# Patient Record
Sex: Male | Born: 1940
Health system: Southern US, Community
[De-identification: ages and names within clinical notes are randomized; demographics above are authoritative.]

## PROBLEM LIST (undated history)

## (undated) ENCOUNTER — Emergency Department (HOSPITAL_COMMUNITY): Admission: EM | Payer: PRIVATE HEALTH INSURANCE | Source: Home / Self Care

## (undated) DIAGNOSIS — I4891 Unspecified atrial fibrillation: Secondary | ICD-10-CM

## (undated) DIAGNOSIS — E785 Hyperlipidemia, unspecified: Secondary | ICD-10-CM

## (undated) DIAGNOSIS — Z8659 Personal history of other mental and behavioral disorders: Secondary | ICD-10-CM

## (undated) DIAGNOSIS — I10 Essential (primary) hypertension: Secondary | ICD-10-CM

## (undated) DIAGNOSIS — N529 Male erectile dysfunction, unspecified: Secondary | ICD-10-CM

## (undated) DIAGNOSIS — N400 Enlarged prostate without lower urinary tract symptoms: Secondary | ICD-10-CM

## (undated) DIAGNOSIS — I509 Heart failure, unspecified: Secondary | ICD-10-CM

## (undated) DIAGNOSIS — I219 Acute myocardial infarction, unspecified: Secondary | ICD-10-CM

## (undated) DIAGNOSIS — F32A Depression, unspecified: Secondary | ICD-10-CM

## (undated) DIAGNOSIS — C679 Malignant neoplasm of bladder, unspecified: Secondary | ICD-10-CM

## (undated) DIAGNOSIS — M1990 Unspecified osteoarthritis, unspecified site: Secondary | ICD-10-CM

## (undated) DIAGNOSIS — K589 Irritable bowel syndrome without diarrhea: Secondary | ICD-10-CM

## (undated) DIAGNOSIS — F329 Major depressive disorder, single episode, unspecified: Secondary | ICD-10-CM

## (undated) DIAGNOSIS — I429 Cardiomyopathy, unspecified: Secondary | ICD-10-CM

## (undated) DIAGNOSIS — G479 Sleep disorder, unspecified: Secondary | ICD-10-CM

## (undated) DIAGNOSIS — R350 Frequency of micturition: Secondary | ICD-10-CM

## (undated) DIAGNOSIS — I2510 Atherosclerotic heart disease of native coronary artery without angina pectoris: Secondary | ICD-10-CM

## (undated) HISTORY — DX: Personal history of other mental and behavioral disorders: Z86.59

## (undated) HISTORY — DX: Essential (primary) hypertension: I10

## (undated) HISTORY — DX: Unspecified atrial fibrillation: I48.91

## (undated) HISTORY — DX: Atherosclerotic heart disease of native coronary artery without angina pectoris: I25.10

## (undated) HISTORY — DX: Male erectile dysfunction, unspecified: N52.9

## (undated) HISTORY — DX: Cardiomyopathy, unspecified: I42.9

## (undated) HISTORY — PX: TONSILLECTOMY: SUR1361

## (undated) HISTORY — DX: Malignant neoplasm of bladder, unspecified: C67.9

## (undated) HISTORY — DX: Hyperlipidemia, unspecified: E78.5

---

## 1997-04-17 ENCOUNTER — Inpatient Hospital Stay (HOSPITAL_COMMUNITY): Admission: EM | Admit: 1997-04-17 | Discharge: 1997-04-30 | Payer: Self-pay | Admitting: *Deleted

## 1997-05-02 ENCOUNTER — Inpatient Hospital Stay (HOSPITAL_COMMUNITY): Admission: AD | Admit: 1997-05-02 | Discharge: 1997-05-10 | Payer: Self-pay | Admitting: *Deleted

## 2000-01-11 DIAGNOSIS — I219 Acute myocardial infarction, unspecified: Secondary | ICD-10-CM

## 2000-01-11 HISTORY — DX: Acute myocardial infarction, unspecified: I21.9

## 2000-08-10 HISTORY — PX: CORONARY ARTERY BYPASS GRAFT: SHX141

## 2000-08-17 ENCOUNTER — Encounter: Payer: Self-pay | Admitting: Emergency Medicine

## 2000-08-17 ENCOUNTER — Inpatient Hospital Stay (HOSPITAL_COMMUNITY): Admission: EM | Admit: 2000-08-17 | Discharge: 2000-08-25 | Payer: Self-pay | Admitting: Emergency Medicine

## 2000-08-19 ENCOUNTER — Encounter: Payer: Self-pay | Admitting: Cardiothoracic Surgery

## 2000-08-21 ENCOUNTER — Encounter: Payer: Self-pay | Admitting: Cardiothoracic Surgery

## 2000-08-22 ENCOUNTER — Encounter: Payer: Self-pay | Admitting: Cardiothoracic Surgery

## 2000-08-23 ENCOUNTER — Encounter: Payer: Self-pay | Admitting: Cardiothoracic Surgery

## 2000-09-07 ENCOUNTER — Encounter: Payer: Self-pay | Admitting: Cardiology

## 2000-09-07 ENCOUNTER — Ambulatory Visit (HOSPITAL_COMMUNITY): Admission: RE | Admit: 2000-09-07 | Discharge: 2000-09-07 | Payer: Self-pay | Admitting: Cardiology

## 2000-09-21 ENCOUNTER — Encounter (HOSPITAL_COMMUNITY): Admission: RE | Admit: 2000-09-21 | Discharge: 2000-10-21 | Payer: Self-pay | Admitting: Cardiology

## 2000-10-23 ENCOUNTER — Encounter (HOSPITAL_COMMUNITY): Admission: RE | Admit: 2000-10-23 | Discharge: 2000-11-22 | Payer: Self-pay | Admitting: Cardiology

## 2000-11-24 ENCOUNTER — Encounter (HOSPITAL_COMMUNITY): Admission: RE | Admit: 2000-11-24 | Discharge: 2000-12-24 | Payer: Self-pay | Admitting: Cardiology

## 2000-12-25 ENCOUNTER — Encounter (HOSPITAL_COMMUNITY): Admission: RE | Admit: 2000-12-25 | Discharge: 2001-01-24 | Payer: Self-pay | Admitting: Cardiology

## 2001-05-29 ENCOUNTER — Emergency Department (HOSPITAL_COMMUNITY): Admission: EM | Admit: 2001-05-29 | Discharge: 2001-05-29 | Payer: Self-pay | Admitting: *Deleted

## 2001-05-29 ENCOUNTER — Encounter: Payer: Self-pay | Admitting: *Deleted

## 2001-05-29 ENCOUNTER — Inpatient Hospital Stay (HOSPITAL_COMMUNITY): Admission: AD | Admit: 2001-05-29 | Discharge: 2001-05-31 | Payer: Self-pay | Admitting: Internal Medicine

## 2001-05-30 ENCOUNTER — Encounter: Payer: Self-pay | Admitting: Internal Medicine

## 2001-06-19 ENCOUNTER — Ambulatory Visit (HOSPITAL_COMMUNITY): Admission: RE | Admit: 2001-06-19 | Discharge: 2001-06-19 | Payer: Self-pay | Admitting: Cardiology

## 2002-02-13 ENCOUNTER — Encounter: Payer: Self-pay | Admitting: *Deleted

## 2002-02-13 ENCOUNTER — Emergency Department (HOSPITAL_COMMUNITY): Admission: EM | Admit: 2002-02-13 | Discharge: 2002-02-13 | Payer: Self-pay | Admitting: *Deleted

## 2002-02-26 ENCOUNTER — Ambulatory Visit (HOSPITAL_COMMUNITY): Admission: RE | Admit: 2002-02-26 | Discharge: 2002-02-26 | Payer: Self-pay | Admitting: Internal Medicine

## 2003-10-11 HISTORY — PX: CIRCUMCISION: SUR203

## 2003-10-15 ENCOUNTER — Ambulatory Visit (HOSPITAL_COMMUNITY): Admission: RE | Admit: 2003-10-15 | Discharge: 2003-10-15 | Payer: Self-pay | Admitting: Urology

## 2003-10-15 ENCOUNTER — Ambulatory Visit (HOSPITAL_BASED_OUTPATIENT_CLINIC_OR_DEPARTMENT_OTHER): Admission: RE | Admit: 2003-10-15 | Discharge: 2003-10-15 | Payer: Self-pay | Admitting: Urology

## 2003-10-15 ENCOUNTER — Encounter (INDEPENDENT_AMBULATORY_CARE_PROVIDER_SITE_OTHER): Payer: Self-pay | Admitting: *Deleted

## 2003-10-30 ENCOUNTER — Ambulatory Visit (HOSPITAL_COMMUNITY): Admission: RE | Admit: 2003-10-30 | Discharge: 2003-10-30 | Payer: Self-pay | Admitting: Cardiology

## 2004-01-29 ENCOUNTER — Ambulatory Visit: Payer: Self-pay | Admitting: Psychiatry

## 2004-03-09 ENCOUNTER — Ambulatory Visit: Payer: Self-pay | Admitting: *Deleted

## 2004-04-22 ENCOUNTER — Ambulatory Visit: Payer: Self-pay | Admitting: Psychiatry

## 2004-05-25 ENCOUNTER — Ambulatory Visit: Payer: Self-pay | Admitting: Internal Medicine

## 2004-06-22 ENCOUNTER — Ambulatory Visit: Payer: Self-pay | Admitting: Psychiatry

## 2004-06-28 ENCOUNTER — Ambulatory Visit: Payer: Self-pay | Admitting: *Deleted

## 2004-08-18 ENCOUNTER — Ambulatory Visit: Payer: Self-pay | Admitting: Internal Medicine

## 2004-08-19 ENCOUNTER — Ambulatory Visit: Payer: Self-pay | Admitting: Psychiatry

## 2004-09-21 ENCOUNTER — Ambulatory Visit: Payer: Self-pay | Admitting: Psychiatry

## 2004-11-09 ENCOUNTER — Ambulatory Visit: Payer: Self-pay | Admitting: Psychiatry

## 2004-11-11 ENCOUNTER — Ambulatory Visit: Payer: Self-pay | Admitting: Internal Medicine

## 2004-12-27 ENCOUNTER — Ambulatory Visit: Payer: Self-pay | Admitting: *Deleted

## 2005-01-05 ENCOUNTER — Ambulatory Visit: Payer: Self-pay | Admitting: Cardiology

## 2005-01-13 ENCOUNTER — Ambulatory Visit: Payer: Self-pay | Admitting: Cardiology

## 2005-01-13 ENCOUNTER — Ambulatory Visit (HOSPITAL_COMMUNITY): Admission: RE | Admit: 2005-01-13 | Discharge: 2005-01-13 | Payer: Self-pay | Admitting: Cardiology

## 2005-01-18 ENCOUNTER — Ambulatory Visit: Payer: Self-pay | Admitting: Psychiatry

## 2005-03-23 ENCOUNTER — Ambulatory Visit: Payer: Self-pay | Admitting: Cardiology

## 2005-04-14 ENCOUNTER — Ambulatory Visit (HOSPITAL_COMMUNITY): Payer: Self-pay | Admitting: Psychiatry

## 2005-05-03 ENCOUNTER — Ambulatory Visit: Payer: Self-pay | Admitting: Internal Medicine

## 2005-06-14 ENCOUNTER — Ambulatory Visit (HOSPITAL_COMMUNITY): Payer: Self-pay | Admitting: Psychiatry

## 2005-07-21 ENCOUNTER — Ambulatory Visit: Payer: Self-pay | Admitting: Cardiology

## 2005-08-17 ENCOUNTER — Ambulatory Visit: Payer: Self-pay | Admitting: Internal Medicine

## 2005-09-08 ENCOUNTER — Ambulatory Visit (HOSPITAL_COMMUNITY): Payer: Self-pay | Admitting: Psychiatry

## 2005-10-07 ENCOUNTER — Emergency Department (HOSPITAL_COMMUNITY): Admission: EM | Admit: 2005-10-07 | Discharge: 2005-10-08 | Payer: Self-pay | Admitting: Emergency Medicine

## 2005-10-26 ENCOUNTER — Ambulatory Visit: Payer: Self-pay | Admitting: Internal Medicine

## 2005-11-23 ENCOUNTER — Ambulatory Visit: Payer: Self-pay | Admitting: Cardiology

## 2006-02-22 ENCOUNTER — Ambulatory Visit: Payer: Self-pay | Admitting: Internal Medicine

## 2006-03-15 ENCOUNTER — Ambulatory Visit: Payer: Self-pay | Admitting: Cardiology

## 2006-06-15 ENCOUNTER — Ambulatory Visit: Payer: Self-pay | Admitting: Internal Medicine

## 2006-06-28 ENCOUNTER — Ambulatory Visit: Payer: Self-pay | Admitting: Cardiology

## 2006-07-19 ENCOUNTER — Ambulatory Visit: Payer: Self-pay | Admitting: Cardiology

## 2006-08-09 ENCOUNTER — Ambulatory Visit: Payer: Self-pay | Admitting: Cardiology

## 2006-08-30 ENCOUNTER — Ambulatory Visit: Payer: Self-pay | Admitting: Cardiovascular Disease

## 2006-10-11 ENCOUNTER — Ambulatory Visit: Payer: Self-pay | Admitting: Cardiology

## 2006-10-12 ENCOUNTER — Ambulatory Visit: Payer: Self-pay | Admitting: Internal Medicine

## 2006-11-15 ENCOUNTER — Ambulatory Visit: Payer: Self-pay | Admitting: Cardiology

## 2006-11-20 ENCOUNTER — Ambulatory Visit: Payer: Self-pay | Admitting: Cardiology

## 2006-11-23 ENCOUNTER — Ambulatory Visit: Payer: Self-pay | Admitting: Internal Medicine

## 2007-01-14 ENCOUNTER — Emergency Department (HOSPITAL_COMMUNITY): Admission: EM | Admit: 2007-01-14 | Discharge: 2007-01-14 | Payer: Self-pay | Admitting: Emergency Medicine

## 2007-03-07 ENCOUNTER — Ambulatory Visit: Payer: Self-pay | Admitting: Internal Medicine

## 2007-07-26 ENCOUNTER — Ambulatory Visit: Payer: Self-pay | Admitting: Internal Medicine

## 2007-11-22 ENCOUNTER — Ambulatory Visit: Payer: Self-pay | Admitting: Cardiology

## 2007-11-22 DIAGNOSIS — Z8659 Personal history of other mental and behavioral disorders: Secondary | ICD-10-CM

## 2007-11-22 DIAGNOSIS — J45909 Unspecified asthma, uncomplicated: Secondary | ICD-10-CM | POA: Insufficient documentation

## 2007-11-22 DIAGNOSIS — J309 Allergic rhinitis, unspecified: Secondary | ICD-10-CM | POA: Insufficient documentation

## 2007-11-23 ENCOUNTER — Ambulatory Visit: Payer: Self-pay | Admitting: Internal Medicine

## 2007-11-30 ENCOUNTER — Encounter (HOSPITAL_COMMUNITY): Admission: RE | Admit: 2007-11-30 | Discharge: 2007-12-30 | Payer: Self-pay | Admitting: Cardiology

## 2007-11-30 ENCOUNTER — Ambulatory Visit: Payer: Self-pay | Admitting: Cardiology

## 2007-12-12 ENCOUNTER — Ambulatory Visit: Payer: Self-pay | Admitting: Internal Medicine

## 2007-12-13 ENCOUNTER — Encounter: Payer: Self-pay | Admitting: Cardiology

## 2008-03-16 ENCOUNTER — Encounter (INDEPENDENT_AMBULATORY_CARE_PROVIDER_SITE_OTHER): Payer: Self-pay | Admitting: *Deleted

## 2008-03-16 LAB — CONVERTED CEMR LAB
Creatinine, Ser: 1.05 mg/dL
Glucose, Bld: 98 mg/dL
Sodium: 131 meq/L

## 2008-04-09 ENCOUNTER — Encounter (INDEPENDENT_AMBULATORY_CARE_PROVIDER_SITE_OTHER): Payer: Self-pay | Admitting: *Deleted

## 2008-04-09 LAB — CONVERTED CEMR LAB
Albumin: 4.3 g/dL
Alkaline Phosphatase: 103 units/L
BUN: 22 mg/dL
CO2: 27 meq/L
Calcium: 9.1 mg/dL
Chloride: 100 meq/L
Creatinine, Ser: 1.24 mg/dL
Glucose, Bld: 97 mg/dL
HDL: 40 mg/dL
Potassium: 4.7 meq/L
Sodium: 138 meq/L
Total Protein: 6.9 g/dL
Triglycerides: 105 mg/dL

## 2008-04-21 ENCOUNTER — Ambulatory Visit: Payer: Self-pay | Admitting: Internal Medicine

## 2008-07-08 ENCOUNTER — Encounter (INDEPENDENT_AMBULATORY_CARE_PROVIDER_SITE_OTHER): Payer: Self-pay | Admitting: *Deleted

## 2008-08-28 ENCOUNTER — Ambulatory Visit: Payer: Self-pay | Admitting: Internal Medicine

## 2008-11-21 ENCOUNTER — Ambulatory Visit: Payer: Self-pay | Admitting: Internal Medicine

## 2008-11-28 ENCOUNTER — Ambulatory Visit: Payer: Self-pay | Admitting: Cardiology

## 2008-11-28 ENCOUNTER — Encounter (INDEPENDENT_AMBULATORY_CARE_PROVIDER_SITE_OTHER): Payer: Self-pay | Admitting: *Deleted

## 2008-11-28 DIAGNOSIS — G4733 Obstructive sleep apnea (adult) (pediatric): Secondary | ICD-10-CM | POA: Insufficient documentation

## 2008-11-28 DIAGNOSIS — E782 Mixed hyperlipidemia: Secondary | ICD-10-CM

## 2008-11-28 DIAGNOSIS — N529 Male erectile dysfunction, unspecified: Secondary | ICD-10-CM

## 2008-11-28 DIAGNOSIS — G4730 Sleep apnea, unspecified: Secondary | ICD-10-CM | POA: Insufficient documentation

## 2009-04-20 ENCOUNTER — Encounter (INDEPENDENT_AMBULATORY_CARE_PROVIDER_SITE_OTHER): Payer: Self-pay | Admitting: *Deleted

## 2009-04-20 LAB — CONVERTED CEMR LAB
AST: 20 units/L
Albumin: 4.6 g/dL
Alkaline Phosphatase: 88 units/L
BUN: 15 mg/dL
Calcium: 9.1 mg/dL
HCT: 40.5 %
Triglycerides: 43 mg/dL

## 2009-05-08 ENCOUNTER — Encounter (INDEPENDENT_AMBULATORY_CARE_PROVIDER_SITE_OTHER): Payer: Self-pay | Admitting: *Deleted

## 2009-06-03 ENCOUNTER — Ambulatory Visit: Payer: Self-pay | Admitting: Internal Medicine

## 2009-06-24 ENCOUNTER — Emergency Department (HOSPITAL_COMMUNITY)
Admission: EM | Admit: 2009-06-24 | Discharge: 2009-06-24 | Payer: Self-pay | Source: Home / Self Care | Admitting: Emergency Medicine

## 2009-06-25 ENCOUNTER — Emergency Department (HOSPITAL_COMMUNITY)
Admission: EM | Admit: 2009-06-25 | Discharge: 2009-06-25 | Payer: Self-pay | Source: Home / Self Care | Admitting: Emergency Medicine

## 2009-07-18 ENCOUNTER — Emergency Department (HOSPITAL_COMMUNITY): Admission: EM | Admit: 2009-07-18 | Discharge: 2009-07-18 | Payer: Self-pay | Admitting: Emergency Medicine

## 2009-10-23 ENCOUNTER — Ambulatory Visit: Payer: Self-pay | Admitting: Internal Medicine

## 2009-11-20 ENCOUNTER — Ambulatory Visit: Payer: Self-pay | Admitting: Internal Medicine

## 2009-11-25 ENCOUNTER — Ambulatory Visit: Payer: Self-pay | Admitting: Cardiology

## 2009-11-25 ENCOUNTER — Encounter (INDEPENDENT_AMBULATORY_CARE_PROVIDER_SITE_OTHER): Payer: Self-pay | Admitting: *Deleted

## 2009-11-30 ENCOUNTER — Encounter: Payer: Self-pay | Admitting: Cardiology

## 2009-11-30 LAB — CONVERTED CEMR LAB
HDL: 47 mg/dL (ref >39)
LDL Cholesterol: 76 mg/dL (ref 0–99)
VLDL: 12 mg/dL (ref 0–40)

## 2010-02-09 NOTE — Miscellaneous (Signed)
Summary: labs per [facility] [place] cbcd,cmp,lipid,04/20/2009  Clinical Lists Changes  Observations: Added new observation of CALCIUM: 9.1 mg/dL (98/11/9145 8:29) Added new observation of ALBUMIN: 4.6 g/dL (56/21/3086 5:78) Added new observation of PROTEIN, TOT: 6.4 g/dL (46/96/2952 8:41) Added new observation of SGPT (ALT): 18 units/L (04/20/2009 8:15) Added new observation of SGOT (AST): 20 units/L (04/20/2009 8:15) Added new observation of ALK PHOS: 88 units/L (04/20/2009 8:15) Added new observation of CREATININE: 1.15 mg/dL (32/44/0102 7:25) Added new observation of BUN: 15 mg/dL (36/64/4034 7:42) Added new observation of BG RANDOM: 100 mg/dL (59/56/3875 6:43) Added new observation of CO2 PLSM/SER: 27 meq/L (04/20/2009 8:15) Added new observation of CL SERUM: 96 meq/L (04/20/2009 8:15) Added new observation of K SERUM: 4.9 meq/L (04/20/2009 8:15) Added new observation of NA: 132 meq/L (04/20/2009 8:15) Added new observation of LDL: 67 mg/dL (32/95/1884 1:66) Added new observation of HDL: 54 mg/dL (07/10/1599 0:93) Added new observation of TRIGLYC TOT: 43 mg/dL (23/55/7322 0:25) Added new observation of CHOLESTEROL: 130 mg/dL (42/70/6237 6:28) Added new observation of PLATELETK/UL: 177 K/uL (04/20/2009 8:15) Added new observation of MCV: 92.3 fL (04/20/2009 8:15) Added new observation of HCT: 40.5 % (04/20/2009 8:15) Added new observation of HGB: 13.3 g/dL (31/51/7616 0:73) Added new observation of WBC COUNT: 6.2 10*3/microliter (04/20/2009 8:15)

## 2010-02-09 NOTE — Letter (Signed)
Summary: [facility] Work [employer], agricultural at [employer]  [address] [place] [address]   Phone: [phone]  Fax: [phone]     November 25, 2009 MRN: [id number]   Grant Stevens [address] [place], [place]  [address]      YOUR LAB WORK IS DUE   November 30, 2009  Please go to [facility], located across the street from [facility] on the second floor.  Hours are Monday - Friday 7am until 7:30pm         Saturday 8am until 12noon    _X_  DO NOT EAT OR DRINK AFTER MIDNIGHT EVENING PRIOR TO LABWORK

## 2010-02-09 NOTE — Assessment & Plan Note (Signed)
Summary: 1 YR F/UPER CHECKOUT ON 11/28/08/TG  Medications Added PROAIR HFA 108 (90 BASE) MCG/ACT AERS (ALBUTEROL SULFATE) use as needed RISPERDAL 1 MG TABS (RISPERIDONE) take 1 tab two times a day LASIX 40 MG TABS (FUROSEMIDE) take 1/2 tablet by mouth daily TEMAZEPAM 15 MG CAPS (TEMAZEPAM) take 1 tab at bedtime CLONAZEPAM 1 MG TABS (CLONAZEPAM) take 1/2 tab two times a day      Allergies Added: NKDA  Visit Type:  Follow-up Referring Provider:  Dr. Clair Gulling; Dr. Carlota Raspberry Primary Provider:  Dr. Carylon Perches   History of Present Illness: [name] is a very nice gentleman who spends an enormous amount of time volunteering at [facility]. and who returns for continued assessment and rx of ischemic cardiomyopathy, which has gradually improved over the past decade.  Patient is now asymptomatic from a cardiac standpoint denying orthopnea, PND, lightheadedness, syncope, exertional dyspnea or chest discomfort.  Blood pressure control has been good.  Allergies are under reasonable control with treatment.  Sleep apnea is controlled with positive pressure nocturnal ventilation.  Patient has no current medical complaints and has not required urgent medical care or hospitalization for some time.      Current Medications (verified): 1)  Epipen 0.3 Mg/0.53ml (1:1000)  Devi (Epinephrine Hcl (Anaphylaxis)) .... Inject As Directed As Needed For Allergic Severe Reaction 2)  Allergy Vaccine  1:10 (W-E)  Go .... Take Once Weekly 3)  Proair Hfa 108 (90 Base) Mcg/act Aers (Albuterol Sulfate) .... Use As Needed 4)  Lisinopril 10 Mg Tabs (Lisinopril) .... Take 1 Tablet By Mouth Once A Day 5)  Risperdal 1 Mg Tabs (Risperidone) .... Take 1 Tab Two Times A Day 6)  Fish Oil 1000 Mg Caps (Omega-3 Fatty Acids) .... Take 2  Tablet By Mouth Bid 7)  Lipitor 80 Mg Tabs (Atorvastatin Calcium) .Marland Kitchen.. 1 At Bedtime 8)  Tegretol 200 Mg Tabs (Carbamazepine) .... 2 Two Times A Day 9)  Bayer Low Strength  81 Mg Tbec (Aspirin) .Marland Kitchen.. 1 Once Daily 10)  Lasix 40 Mg Tabs (Furosemide) .... Take 1/2 Tablet By Mouth Daily 11)  Temazepam 15 Mg Caps (Temazepam) .... Take 1 Tab At Bedtime 12)  Clonazepam 1 Mg Tabs (Clonazepam) .... Take 1/2 Tab Two Times A Day  Allergies (verified): No Known Drug Allergies  Comments:  Nurse/Medical Assistant: patient brought meds and we reviewed med list from last ov and the only change is risperidone went from 0.5mg  two times a day to 1 mg two times a day and patient has started clonazepam 1 mg 1/2 tab two times a day Dr.plovsky     Past History:  PMH, FH, and Social History reviewed and updated.  Review of Systems       See history of present illness.  Vital Signs:  Patient profile:   70 year old male Weight:      243 pounds O2 Sat:      98 % on Room air Pulse rate:   78 / minute BP sitting:   101 / 63  (right arm)  Vitals Entered By: Dreama Saa, CNA (November 25, 2009 12:54 PM)  O2 Flow:  Room air  Physical Exam  General:  Overweight; well developed; no acute distress Weight-243, 2 pounds decreased since earlier this month Neck-No JVD; no carotid bruits: Lungs-No tachypnea, no rales; no rhonchi; no wheezes; mild kyphosis Cardiovascular-normal PMI; normal S1 and S2; fourth heart sound present; grade 1-2 systolic murmur at the cardiac base Abdomen-BS normal; soft and non-tender  without masses or organomegaly:  Musculoskeletal-No deformities, no cyanosis or clubbing: Neurologic-Normal cranial nerves; symmetric strength and tone:  Skin-Warm, no significant lesions: Extremities-Distal pulses are preserved; no edema:     Impression & Recommendations:  Problem # 1:  ATHEROSCLEROTIC CARDIOVASCULAR DISEASE (ICD-429.2) No symptoms to suggest progression of disease or myocardial ischemia.  No requirement for intensification of therapy.  There has not been clinical evidence for congestive heart failure for quite some time.  His dose of furosemide  will be tapered, initially to 20 mg q.d.  He''ll carefully follow symptoms and weights at home and report any notable anomalies.  Problem # 2:  HYPERLIPIDEMIA (ICD-272.4) Lipid profile was excellent earlier this year; a repeat value will be obtained.  CHOL: 130 (04/20/2009)   LDL: 67 (04/20/2009)   HDL: 54 (04/20/2009)   TG: 43 (04/20/2009)  Problem # 3:  HYPERTENSION (ICD-401.1) Blood pressure control is good; current medications will be continued.  Problem # 4:  CORONARY ARTERY BYPASS GRAFT, HX OF (ICD-V45.81)  Other Orders: Future Orders: T-Lipid Profile (04540-98119) ... 11/30/2009  Patient Instructions: 1)  Your physician recommends that you schedule a follow-up appointment in: 9 months 2)  Your physician recommends that you return for lab work in: next week 3)  Your physician has recommended you make the following change in your medication: decrease furosemide to 1/2 tablet daily

## 2010-02-09 NOTE — Assessment & Plan Note (Signed)
Summary: rov 1 yr ///kp   Copy to:  Dr. Clair Gulling; Dr. Carlota Raspberry Primary Provider/Referring Provider:  Dr. Carylon Perches   History of Present Illness: From lov 11/26/06- HISTORY:  "Fine, a good year". He denies any asthma or any need for metered inhalers in a long time. 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 He had some mild nasal congestion problems early in the fall, but he says that allergy vaccine has kept him stable. He had flu shot.   11/23/07- allergic rhinitis, asthma. Continues comfortable and satisfied with Vaccine at 1:10. Rare need for rescue inhaler. had flu vax Discussed meds, vaccine, flu season.   November 21, 2008- Allergic rhinitis, asthma Had normal stress test. Woke with some pain and swelling around right eye- plans opthal checkup but better now. Still doing very well with allergy vaccine. We discussed his source for allergy syringes and replacement script written. Rarely asthma and never needs Epipen or Proair. No need for antihistamines.  November 20, 2009- Allergic rhinitis, asthma, CAD/MI/CABG One year f/u. Had flu vax. Denies wheeze, dyspnea, cough. used Proair no more than once.  Allergy shots still doing well. Gave him option to try stopping.     Asthma History    Initial Asthma Severity Rating:    Age range: 12+ years    Symptoms: 0-2 days/week    Nighttime Awakenings: 0-2/month    Interferes w/ normal activity: no limitations    SABA use (not for EIB): 0-2 days/week    Asthma Severity Assessment: Intermittent   Preventive Screening-Counseling & Management  Alcohol-Tobacco     Smoking Status: quit     Packs/Day: 1.0     Year Quit: 1973     Pack years: 15  Current Medications (verified): 1)  Epipen 0.3 Mg/0.27ml (1:1000)  Devi (Epinephrine Hcl (Anaphylaxis)) .... Inject As Directed As Needed For Allergic Severe Reaction 2)  Allergy Vaccine  1:10 (W-E)  Go .... Take Once Weekly 3)  Proair Hfa 108 (90 Base) Mcg/act Aers (Albuterol  Sulfate) .... 2 Puffs Four Times A Day As Needed 4)  Lisinopril 10 Mg Tabs (Lisinopril) .... Take 1 Tablet By Mouth Once A Day 5)  Risperdal 0.5 Mg Tabs (Risperidone) .... Take 1 Tab Two Times A Day 6)  Fish Oil 1000 Mg Caps (Omega-3 Fatty Acids) .... Take 2  Tablet By Mouth Bid 7)  Lipitor 80 Mg Tabs (Atorvastatin Calcium) .Marland Kitchen.. 1 At Bedtime 8)  Tegretol 200 Mg Tabs (Carbamazepine) .... 2 Two Times A Day 9)  Bayer Low Strength 81 Mg Tbec (Aspirin) .Marland Kitchen.. 1 Once Daily 10)  Lasix 40 Mg Tabs (Furosemide) .... Take 1 Tablet Daily  Allergies (verified): No Known Drug Allergies  Past History:  Past Medical History: Last updated: 11/28/2008 ASCVD: Coronary artery bypass graft surgery in 06/2001 following inferior myocardial infarction; ejection fraction      of 35% increase to 50% postoperatively; enrolled in WARCEF study HYPERTENSION Hyperlipidemia Tobacco abuse-discontinued Sleep apnea BIPOLAR AFFECTIVE DISORDER, HX OF (ICD-V11.8) ALLERGIC RHINITIS (ICD-477.9): Grass and weeds ASTHMA (ICD-493.90) ERECTILE DYSFUNCTION  Past Surgical History: Last updated: 11/28/2008 Coronary artery bypass graft surgery-06/2001 by Dr. Tyrone Sage and Tonsillectomy CIRCUMCISION 10/2003  Family History: Last updated: 01/14/2008 Welford Roche- Mother DM- Brother MI- Father   Mother-deceased age 36 from old age Father- deceased age 9 from MI  Social History: Last updated: 11/23/2007 Married No children No ETOH Former smoker.  Quit in 1973.  Smoked 1 ppd x 15 years exercise 4 x wkly Caffeine once a day  Risk Factors: Smoking Status: quit (11/20/2009) Packs/Day: 1.0 (11/20/2009)  Social History: Packs/Day:  1.0 Pack years:  15  Review of Systems      See HPI  The patient denies shortness of breath with activity, shortness of breath at rest, productive cough, non-productive cough, coughing up blood, chest pain, irregular heartbeats, acid heartburn, indigestion, loss of appetite, weight change,  abdominal pain, difficulty swallowing, sore throat, tooth/dental problems, headaches, nasal congestion/difficulty breathing through nose, and sneezing.    Vital Signs:  Patient profile:   70 year old male Height:      74 inches Weight:      245.38 pounds BMI:     31.62 O2 Sat:      96 % on Room air Pulse rate:   74 / minute BP sitting:   96 / 62  (left arm) Cuff size:   large  Vitals Entered By: Gweneth Dimitri RN (November 20, 2009 9:51 AM)  O2 Flow:  Room air Comments Medications reviewed with patient Daytime contact number verified with patient. Gweneth Dimitri RN  November 20, 2009 9:51 AM    Physical Exam  Additional Exam:  General: A/Ox3; pleasant and cooperative, NAD, SKIN: no rash, lesions NODES: no lymphadenopathy HEENT: [place]/AT, EOM- WNL, Conjuctivae- clear, PERRLA, TM-WNL, Nose- clear, Throat- clear and wnl, Mallampati III NECK: Supple w/ fair ROM, JVD- none, normal carotid impulses w/o bruits Thyroid- normal to palpation CHEST: Clear to P&A HEART: RRR, no m/g/r heard ABDOMEN: medium build. ZOX:WRUE, nl pulses, no edema  NEURO: Grossly intact to observation      Impression & Recommendations:  Problem # 1:  ALLERGIC RHINITIS (ICD-477.9)  I discussed allergy vaccine and alternatives. He is considering whether he would like to try stopping his shots.  Orders: Est. Patient Level III (45409)  Problem # 2:  ASTHMA (ICD-493.90) Excellent control of mild intermittent asthma.  Problem # 3:  TOBACCO ABUSE-DISCONTINUED (ICD-305.1)  We verified that he has successfully remained off tobacco.   Patient Instructions: 1)  Please schedule a follow-up appointment in 1 year. 2)  Call sooner if needed Prescriptions: PROAIR HFA 108 (90 BASE) MCG/ACT AERS (ALBUTEROL SULFATE) 2 puffs four times a day as needed  #1 x prn   Entered and Authorized by:   Waymon Budge MD   Signed by:   Waymon Budge MD on 11/20/2009   Method used:   Print then Give to Patient   RxID:    [id number] EPIPEN 0.3 MG/0.3ML (1:1000)  DEVI (EPINEPHRINE HCL (ANAPHYLAXIS)) Inject as directed as needed for allergic severe reaction  #1 x 12   Entered and Authorized by:   Waymon Budge MD   Signed by:   Waymon Budge MD on 11/20/2009   Method used:   Print then Give to Patient   RxID:   [id number]    Immunization History:  Influenza Immunization History:    Influenza:  historical (10/10/2009)

## 2010-05-19 ENCOUNTER — Encounter: Payer: Self-pay | Admitting: Cardiology

## 2010-05-25 NOTE — Letter (Signed)
November 20, 2006    Kingsley Callander. Ouida Sills, MD  [address]  [place] [address]   RE:  Grant Stevens, GOLEBIEWSKI  MRN:  [id number]  /  DOB:  10/04/1940   Dear Channing Mutters:   Mr. Fosberg returns to the office for continued assessment and treatment  of coronary disease and cardiovascular risk factors.  Since his last  visit, he has done superbly.  He reports no dyspnea nor chest  discomfort.  He has some mild pedal edema.  He continued to follow up  with his psychiatrist, who has adjusted his medications with generally  good control of symptoms.  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   CURRENT MEDICATIONS:  1. Vytorin 10/80 mg daily.  2. Risperdal 0.5 mg daily.  3. Fish Oil 1200 mg b.i.d.  4. Lisinopril 10 mg b.i.d.  5. Tegretol 200 mg b.i.d.  6. Aspirin 81 mg daily.  7. Furosemide 40 mg daily.   PHYSICAL EXAMINATION:  GENERAL:  Pleasant gentleman in no acute  distress.  VITAL SIGNS:  Weight is 230 pounds, 3 pounds more than in March of this  year.  Heart rate 80 and regular, blood pressure 94/60, respirations 16.  NECK:  No jugular venous distention; normal carotid upstrokes without  bruits.  LUNGS:  Clear.  HEART:  Distant first and second heart sounds.  ABDOMEN:  Soft and nontender; aortic pulsation not palpable; no  organomegaly.  EXTREMITIES:  1+ pretibial edema; distal pulses intact.   IMPRESSION:  The patient continues to do extremely well from a  cardiovascular standpoint.  Hypertension is certainly well controlled.  A recent lipid profile is acceptable with total cholesterol of 160, LDL  of 97, and HDL of 45.  A chemistry profile and CBC were also normal.  I  have recommended no changes in the patient's medical regime other than  to increase his dose of Fish Oil and we will plan to see this nice  gentleman again in one year.    Sincerely,      Gerrit Friends. Dietrich Pates, MD, [facility]  Electronically Signed    RMR/MedQ  DD:  11/20/2006  DT: 11/21/2006  Job #: [id number]

## 2010-05-25 NOTE — Assessment & Plan Note (Signed)
[facility]                             PULMONARY OFFICE NOTE   NAME:JOHNSONJsaon, Yoo                     MRN:          [id number]  DATE:11/23/2006                            DOB:          31-Oct-1940    PROBLEMS:  1. Asthma.  2. Allergic rhinitis.  3. Bipolar.  4. CHF/MI/CABG.   HISTORY:  Fine, a good year. He denies any asthma or any need for  metered inhalers in a long time. 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 He had some mild nasal congestion problems early in the fall, but  he says that allergy vaccine has kept him stable. He had flu shot.   MEDICATIONS:  1. Vytorin 10/80.  2. Lisinopril 10 mg.  3. Furosemide 40 mg.  4. Respirdal 0.5 mg.  5. Allergy vaccine.  6. Fish oil.   He is off of his heart study drug.   ALLERGIES:  No medication allergy.   He does not think that he needs to have a rescue inhaler refilled.   OBJECTIVE:  Weight 239 pounds, blood pressure 132/83, pulse 70, room air  saturation 96%. Pulse is regular. Heart sounds are normal. Lungs are  very clear. Breathing is unlabored. Nasal airway is clear.   IMPRESSION:  Rhinitis controlled. No recent asthma.   PLAN:  Continue vaccine. We have discussed environmental precautions and  warning symptoms. Schedule return 1 year, earlier p.r.n.     Clinton D. Maple Hudson, MD, [facility], FACP  Electronically Signed    CDY/MedQ  DD: 11/26/2006  DT: 11/27/2006  Job #: [id number]   cc:   Kingsley Callander. Ouida Sills, MD

## 2010-05-25 NOTE — Assessment & Plan Note (Signed)
[facility]                       [place] [facility]IOLOGY OFFICE NOTE   SHANNA, STRENGTH                     MRN:          [id number]  DATE:11/22/2007                            DOB:          1940-10-02    CARDIOLOGIST:  Gerrit Friends. Dietrich Pates, MD, [facility]   PRIMARY CARE PHYSICIAN:  Kingsley Callander. Ouida Sills, MD   REASON FOR VISIT:  One-year followup.   HISTORY OF PRESENT ILLNESS:  [name] is a 70 year old male with a  history of coronary artery disease status post inferior myocardial  infarction in 2002 followed by subsequent bypass surgery by Dr.  Tyrone Sage.  His grafts included a LIMA to LAD, vein graft to posterior  descending and vein graft to the diagonal.  His EF was previously  documented at 35%.  He was previously in the [facility] trial.  His most  recent echocardiogram in July 2007 demonstrated an EF of 50%.  He  returns for followup today.  He has overall been doing well without any  chest pain or shortness of breath.  He is quite active and volunteers at  [facility] [employer] twice a week.  He denies any exertional chest  heaviness or tightness or shortness of breath.  He denies any syncope,  near syncope, orthopnea, PND, or pedal edema.  He describes NYHA class  II symptoms.   CURRENT MEDICATIONS:  Risperdal 0.25 mg daily, 1 mg 2 tablets nightly,  Fish oil, Lisinopril 10 mg b.i.d., Tegretol 200 mg 2 tablets b.i.d.,  Aspirin 81 mg daily, Furosemide 40 mg daily, Lipitor 80 mg daily,  Lorazepam p.r.n., Digestive Advantage p.r.n.   ALLERGIES:  No known drug allergies.   PHYSICAL EXAMINATION:  GENERAL:  He is well-nourished, well-developed  male, in on distress.  VITAL SIGNS:  Blood pressure is 110/60, pulse 62, weight 222 pounds.  HEENT:  Normal neck without JVD.  CARDIAC:  Normal S1 and S2.  Regular rate and rhythm without murmur.  LUNGS:  Clear to auscultation bilaterally.  ABDOMEN:  Soft, nontender.  EXTREMITIES:  Without edema.  NEUROLOGIC:  He  is alert and oriented x3.  Cranial II-XII are grossly  intact.  VASCULAR:  No carotid bruits noted bilaterally.  Femoral artery pulses  were 2+ bilaterally without bruits.   ASSESSMENT AND PLAN:  1. Coronary artery disease, status post prior inferior myocardial      infarction and subsequent bypass surgery with grafts as outlined      above in 2002.  His last Cardiolite study was in 2003 and this      demonstrated no ischemia.  His prior EF was 35%, but his most      recent EF was documented at 50% by echocardiogram in 2007.  He has      not had an ischemic evaluation in 6 years now.  He will be set up      for routine stress Cardiolite testing to assess for graft patency.      We will obtain an EKG before he leaves the office today as well.  2. Dyslipidemia.  His goal LDL is less than or equal to 70.  We will      try to obtain recent labs by Dr. Ouida Sills for our own records.  3. Hypertension, well-controlled.  4. Bipolar disorder.  He will continue his current medications and      follow up with his psychiatrist.   DISPOSITION:  The patient will be brought back in followup in 1 year  with myself or Dr. Dietrich Pates or sooner should his stress test be  abnormal.      Tereso Newcomer, PA-C  Electronically Signed      Gerrit Friends. Dietrich Pates, MD, [facility]  Electronically Signed   SW/MedQ  DD: 11/22/2007  DT: 11/23/2007  Job #: [id number]

## 2010-05-28 NOTE — Op Note (Signed)
[place]. [facility]  Patient:    Grant Stevens, Grant Stevens                       MRN: [id number] Proc. Date: 08/21/00 Adm. Date:  [id number] Attending:  Waldo Laine CC:         Arturo Morton. Riley Kill, M.D. [facility]  Dr. Carylon Perches, [place]   Operative Report  PREOPERATIVE DIAGNOSIS:  Coronary occlusive disease with recent acute transmural myocardial infarction.  POSTOPERATIVE DIAGNOSIS:  Coronary occlusive disease with recent acute transmural myocardial infarction.  PROCEDURE:  Coronary artery bypass grafting x 3, with the left internal mammary artery to the left anterior descending coronary artery, reversed saphenous vein graft to the diagonal coronary artery, reversed saphenous vein graft to posterior descending coronary artery.  SURGEON:  Gwenith Daily. Tyrone Sage, M.D.  FIRST ASSISTANT:  Lissa Merlin, P.A.  BRIEF HISTORY:  Patient is a 70 year old male who several days prior to admission had the onset of chest discomfort, increasing shortness of breath, pallor.  Because of ongoing symptoms over a several-day period, he sought medical attention.  EKG revealed acute myocardial infarction.  The patient was referred to [facility].  He underwent cardiac catheterization by Dr. Riley Kill, which demonstrated a totally occluded right coronary artery after the takeoff of a moderate-sized acute marginal.  This vessel appeared to be acutely occluded.  In addition, the patient had severe bifurcation disease in the LAD at the takeoff of a diagonal of greater than 80%.  The circumflex coronary had luminal irregularities but no high-grade stenosis.  The patient had decreased LV function with evidence of inferior posterior myocardial infarction.  Coronary artery bypass grafting was recommended to the patient, who agreed and signed informed consent.  DESCRIPTION OF PROCEDURE:  With Swan-Ganz and arterial line monitors in place, the patient underwent general endotracheal  anesthesia without incident.  Skin of the chest and legs was prepped with Betadine and draped in the usual sterile manner.  A small incision was made in the left ankle over the vein, and the left ankle appeared small.  An incision was made in the right ankle and the vein appeared, to be larger and a segment of vein was harvested from the right lower leg and was of good quality and caliber.  A median sternotomy was performed.  The left internal mammary artery was dissected down as a pedicle graft.  The distal artery was divided, had good, free flow.  The pericardium was opened.  Overall the anterior myocardial appeared to have good function.  There was evidence of right ventricular enlargement and significant inferior posterior hypokinesis and evidence of recent transmural infarct.  The patient was systemically heparinized, the ascending aorta and the right atrium were cannulated, and the aortic root vent cardioplegia needle was introduced into the ascending aorta.  The patient was placed on cardiopulmonary bypass, 2.4 L/min. per sq m.  Sites of anastomosis were selected and dissected out of the epicardium.  The patients body temperature was cooled to 30 degrees, aortic crossclamp was applied, and 500 cc of cold blood potassium cardioplegia was administered with rapid diastolic arrest of the heart.  The myocardial septal temperature was monitored throughout the crossclamp period.  Attention was turned first to the posterior descending coronary artery, which was opened and was a diffusely diseased vessel but did admit a 1.5 mm probe both proximally and distally.  Using a running 7-0 Prolene, a segment of vein was anastomosed to the posterior descending coronary  artery.  Additional cold blood cardioplegia was administered down the vein graft.  Attention was then turned to the diagonal coronary artery, which was fairly high on the lateral wall and not in a suitable position to sequential the  left internal mammary to the diagonal and the LAD.  The vessel was opened and admitted a 1.5 mm probe. Using a running 7-0 Prolene, a segment of reversed saphenous vein graft was anastomosed to the diagonal coronary artery.  Attention was then turned to the left anterior descending coronary artery, which was opened and admitted a 1.5 mm probe.  Using running 8-0 Prolene, the left internal mammary artery was anastomosed to the left anterior descending coronary artery.  With release of the Edwards bulldog on the mammary artery, there was appropriate rise in myocardial septal temperature.  The aortic crossclamp was removed.  Total crossclamp time 39 minutes.  The patient spontaneously converted to a sinus rhythm.  A partial occlusion clamp was placed on the ascending aorta.  Two punch aortotomies were performed, each of the two vein grafts anastomosed to the ascending aorta.  The air was evacuated from the grafts, and the partial occlusion clamp was removed.  Sites of anastomosis were inspected and were free of bleeding.  Patient was then ventilated, weaned from cardiopulmonary bypass without difficulty on low-dose milrinone and dopamine.  He remained hemodynamically stable.  He was decannulated in the usual fashion.  Protamine sulfate was administered.  With the operative field hemostatic, two atrial and two ventricular pacing wires were applied, graft markers applied.  Left pleural tube, two mediastinal tubes left in place.  The sternum was closed with #6 stainless steel wire.  Fascia closed with interrupted 0 Vicryl, running 3-0 Vicryl in the subcutaneous tissue, and a 4-0 subcuticular stitch in the skin edges.  Dry dressings were applied.  Sponge and needle count was reported as correct at completion of the procedure.  The patient tolerated the procedure without obvious complication and was transferred to the surgical intensive care unit for further postoperative care. DD:  08/22/00 TD:   08/22/00 Job: [id number]

## 2010-05-28 NOTE — H&P (Signed)
[facility]  Patient:    Grant Stevens, Grant Stevens Visit Number: [id number] MRN: [id number]          Service Type: MED Location: 2A A206 01 Attending Physician:  Carylon Perches Dictated by:   Carylon Perches, M.D. Admit Date:  05/29/2001                           History and Physical  CHIEF COMPLAINT:  Shortness of breath.  HISTORY OF THE PRESENT ILLNESS:  This patient is a 70 year old white male with a history of bypass surgery in August 2002, who presented to cardiology clinic for evaluation of congestive heart failure and was felt to require admission. He had awakened from sleep with acute onset of shortness of breath the night before and had been treated in the emergency room with IV Lasix after a diagnosis of congestive heart failure had been made.  The patient had a brisk diuresis.  He helped an acquaintance capture some bees [place] at [facility] and then reported to the cardiology clinic.  He denied any symptoms of chest pain.  He had had some mild dyspnea on exertion.  There was no diaphoresis, vomiting or syncope.  He had experienced an out-of-the-hospital inferior MI prior to his bypass surgery.  His ejection fraction pre-bypass was 55%.  He had an inferior wall motion abnormality.  He had not previously experienced any paroxysmal nocturnal dyspnea or orthopnea.  PAST MEDICAL HISTORY: 1. Coronary artery disease, status post CABG. 2. Bipolar disorder. 3. Hypertension. 4. Hyperlipidemia. 5. Tonsillectomy.  MEDICATIONS: 1. Pravachol 80 mg q.d. 2. Foltx q.d. 3. Altace 5 mg q.d. 4. Aspirin q.d. 5. Tegretol 200 mg q.a.m., 400 mg q.h.s. 6. Neurontin 100 mg q.i.d.  ALLERGIES:  None.  SOCIAL HISTORY:  He works in Airline pilot in [employer].  He does not smoke cigarettes, drink alcohol or use recreational drugs.   REVIEW OF SYSTEMS:  No chest pain, syncope, change in bowel habits or difficulty voiding.  PHYSICAL  EXAMINATION:  VITAL SIGNS:  Temperature 97.6, pulse 88, respirations 20, blood pressure 145/82.  GENERAL:  Alert, oriented white male in no acute distress.  HEENT:  No scleral icterus.  Pharynx is unremarkable.  NECK:  Supple with no JVD, thyromegaly or carotid bruits.  LUNGS:  Clear.  HEART:  Regular with no murmurs or gallops.  ABDOMEN:  Nontender with no hepatosplenomegaly.  EXTREMITIES:  No cyanosis, clubbing or edema.  NEUROLOGIC:  Grossly intact.  LABORATORY AND ACCESSORY DATA:  White count 6.1, hemoglobin 14.8, platelets 196,000.  Sodium 136, potassium 4.3, glucose 115, BUN 17, creatinine 1.3. First two CPKs are 143 and 137 with troponins of 0.02 and 0.03.  His chest x-ray is consistent with CHF.  His EKG reveals normal sinus rhythm and a right bundle branch block.  IMPRESSION: 1. New-onset congestive heart failure.  He is being hospitalized for further    evaluation with cardiology consultation and an echocardiogram.  We will    start Lasix and continue Altace; his Altace dose has been increased to    10 mg a day.  Continue Pravachol and aspirin. 2. Coronary artery disease. 3. Hyperlipidemia. 4. Hypertension. 5. Bipolar disorder. Dictated by:   Carylon Perches, M.D. Attending Physician:  Carylon Perches DD:  05/30/01 TD:  05/31/01 Job: [id number]

## 2010-05-28 NOTE — Procedures (Signed)
NAME:  Grant, Stevens              ACCOUNT NO.:  [id number]   MEDICAL RECORD NO.:  [id number]          PATIENT TYPE:  OUT   LOCATION:  RAD                           FACILITY:  [facility]   PHYSICIAN:  [place] Bing, M.D. [facility] OF BIRTH:  [birth year]   DATE OF PROCEDURE:  01/13/2005  DATE OF DISCHARGE:                                  ECHOCARDIOGRAM   REFERRING:  Dr. Ouida Sills and Dr. Dietrich Pates.   CLINICAL DATA:  A 70 year old gentleman with cardiomegaly.   M-MODE:  Aorta 3.6, left atrium 5.1, septum 1.4, posterior wall 1.4, LV  diastole 5.2, LV systole 4.2.   1.  Technically suboptimal but adequate echocardiographic study.  2.  Mild right and left atrial enlargement; normal right ventricle.  3.  Trileaflet aortic valve; mild sclerosis of the leaflets; mild annular      calcification.  4.  Normal tricuspid valve.  5.  Normal mitral valve; mild annular calcification; trace regurgitation.  6.  Left ventricular size at the upper limit of normal; mild concentric      hypertrophy; small dyskinetic segment at the base of the      inferior/posterior regions; the remainder of the inferior wall is      somewhat hypokinetic. Overall LV systolic function is low normal.  7.  Normal IVC.  8.  Comparison with prior study of June 19, 2001:  Left ventricular function      has improved.      [place] Bing, M.D. [facility]  Electronically Signed     RR/MEDQ  D:  01/13/2005  T:  01/14/2005  Job:  [id number]

## 2010-05-28 NOTE — Assessment & Plan Note (Signed)
 HEALTHCARE                               PULMONARY OFFICE NOTE   NAME:JOHNSONCotton, Beckley                     MRN:          [id number]  DATE:08/17/2005                            DOB:          1940/04/10    PROBLEM:  1. Asthma.  2. Allergic rhinitis.  3. Bipolar disorder.  4. Coronary disease/infarction/heart failure/bypass.   HISTORY:  He comes for a one-year followup of his allergy problems reporting  he has been stable over the past year with no major flare-ups or symptomatic  problems.  He continues his allergy vaccine at 1:10 wishing to continue  giving his own injections.  We again reviewed risk and benefit  considerations and I discussed policy and concerns related to administration  outside of a medical office, anaphylaxis and EpiPen.  He has never had any  significant reaction to his vaccine and believes it helps him.  He is not  smoking.   MEDICATIONS:  1. Neurontin.  2. Vytorin 10/80.  3. Tegretol.  4. Lisinopril 10 mg.  5. Furosemide 40 mg.  6. Risperdal 0.5 mg.  7. Heart study drug.   ALLERGIES:  No medication allergy.  EpiPen available.   OBJECTIVE:  Weight 224 pounds, BP 108/58, pulse regular 71, room air  saturation 96%.  Calm, quiet affect.  Conjunctivae, nasal mucosa and pharynx  were clear.  LUNGS:  Clear to P&A with no wheeze or rales.  HEART:  Heart sounds regular without murmur or gallop heard.  No edema.   IMPRESSION:  Stable control of asthma and allergic rhinitis.  Asthma in  particular has been insignificant, not needing any bronchodilator although  we will continue to watch that.   PLAN:  1. With risk/benefit discussion as above, he has signed our allergy      vaccine waiver form.  2. Epinephrine/EpiPen refill.  3. Schedule return one year, earlier prn.                                   Clinton D. Maple Hudson, MD, FCCP, FACP   CDY/MedQ  DD:  08/21/2005  DT:  08/22/2005  Job #:  [id number]   cc:   Kingsley Callander.  Ouida Sills, MD  Gerrit Friends. Dietrich Pates, MD, [facility]

## 2010-05-28 NOTE — Cardiovascular Report (Signed)
[place]  Patient:    Grant Stevens, Grant Stevens                       MRN: [id number] Proc. Date: 08/17/00 Adm. Date:  [id number] Attending:  Veneda Melter CC:         Carylon Perches, M.D.  Veneda Melter, M.D.  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  He did not seek medical attention, but was convinced after 48 hours, by his sister, to see his private physician who promptly and appropriately referred him to the [facility] Emergency Room by ambulance.  There was evidence of an inferior infarction by EKG.  On arrival in the emergency room, CPK-MB was in the range of 1800 mcg/dl and there were Q-waves in the electrocardiogram.  His main complaint at this point was shortness of breath.  The current study was done to assess coronary anatomy.  PROCEDURES PERFORMED: 1. Left heart catheterization. 2. Selective coronary arteriography. 3. Selective left ventriculography. 4. Subclavian angiography.  DESCRIPTION OF PROCEDURE:  The patient was brought to the catheterization laboratory and prepped and draped in the usual fashion.  Through an anterior puncture, the right femoral artery was easily entered.  A 7-French sheath was placed.  Views of the right and left coronary arteries were obtained in multiple angiographic projections.  Ventriculography was performed in the RAO projection.  When it became obvious that the patient would require revascularization surgery, we then performed a subclavian angiogram.  At this point, CVTS was called and Dr. Dorris Fetch responded.  We discussed the case and surgical revascularization, probably on an elective basis if the patient remains stable, was recommended.  I also reviewed the films with Dr. Veneda Melter who was in agreement with this proposal.  The patient was taken to the holding  area is satisfactory clinical condition after sewing the sheath in place.  HEMODYNAMIC DATA: 1. Central aortic pressure 100/66. 2. Left ventricular pressure 105/24. 3. No aortic to left ventricular gradient on pullback.  ANGIOGRAPHIC DATA: 1. Left ventriculography was performed in the RAO projection.  Ejection    fraction was calculated using the area-length method.  Ejection fraction    was calculated at 55%.  There was an extensive area of severe inferior wall    hypokinesis.  The area did not appear to be completely akinetic. 2. The left main coronary artery demonstrates some luminal irregularity    throughout with no lesion specifically exceeding 30% in luminal reduction. 3. The left anterior descending artery is calcified.  There is about a 60%    area of focal narrowing in the proximal vessel followed by a subtotal 95%    stenosis in the mid vessel subtending a large diagonal branch.  Both the    diagonal and distal LAD appeared to be suitable for grafting.  There were    septal collaterals to the distal PDA system. 4. The circumflex provided a large marginal branch and a smaller marginal    branch.  Proximally, there was about 30% narrowing that did not appear to    be high-grade. 5. The right coronary artery demonstrates 70% narrowing, tapering down to 99%    and then the vessel is totally occluded.  This overlaps an origin of a    right ventricular branch, which itself has a 70% narrowing.  The distal  right coronary artery fills by collaterals from the left septal    perforators. 6. The subclavian vessel appears to be widely patent, as does the internal    mammary.  It appears to be a suitable graft for revascularization.  CONCLUSIONS: 1. Mild reduction in global left ventricular function with an extensive    inferior wall motion abnormality with hypo-, but not akinesis. 2. Q-waves and elevated CPKs, suggesting myocardial infarction of    approximately 24-48 hours  old. 3. Extensive disease of the left anterior descending artery overlapping the    origin of a diagonal branch.  DISPOSITION:  Based upon the patients anatomy, the LAD is most worrisome for a bifurcational stenosis that is not ideal for percutaneous intervention. With the total occlusion of the right coronary artery, extensive collaterals, and viable myocardium, revascularization surgery would be strongly considered for relief of symptoms and myocardial salvage.  A surgical consultation has been obtained with Dr. Dorris Fetch and we have discussed the case.  In the interim, he will be placed in the coronary care step-down area with close monitoring. DD:  08/17/00 TD:  08/18/00 Job: [id number]

## 2010-05-28 NOTE — Letter (Signed)
November 23, 2005    Kingsley Callander. Ouida Sills, MD  [address]  [place], [place] [address]   RE:  Grant Stevens, Grant Stevens  MRN:  [id number]  /  DOB:  1940/09/21   Dear Grant Stevens:   Grant Stevens returns to the office for continued assessment and treatment of  coronary disease. Since his last visit one year ago, he has done  beautifully. His only significant medical problem has been constipation that  required treatment in the emergency department. He reports no dyspnea nor  chest discomfort. He is followed by a new psychiatrist, Grant Stevens, who is  excellent. He had an echocardiogram in January that revealed recovery of  left ventricular systolic function. His ejection fraction was previously  0.35 and is now near normal. He continues to participate in the [facility]  protocol, receiving either aspirin or Warfarin. Recent laboratory performed  in your office was excellent with good control of hyperlipidemia, normal  electrolytes, normal LFTs and normal renal function.   CURRENT MEDICATIONS:  1. Furosemide 40 mg daily.  2. Aspirin 81 mg daily.  3. Lisinopril 10 mg daily.  4. WARCEF study drug.  5. Vytorin 10/80 mg daily.  6. Fish oil 1000 mg b.i.d.  7. Risperdal 1 mg q.h.s.  8. Carbamazepine 400 mg b.i.d.  9. Ambien 12.5 mg q.h.s. Despite taking Ambien, he continues to have a      sleep disturbance with early morning awakening.   PHYSICAL EXAMINATION:  GENERAL:  Pleasant, overweight gentleman in no acute  distress.  VITAL SIGNS:  The weight is 242, 20 pounds more than in July. Blood pressure  was 115/70, heart rate 75 and regular, respirations 16.  NECK:  No jugular venous distention; normal carotid upstrokes with a  questionable left-sided early systolic bruit.  HEENT:  Anicteric sclera.  CARDIAC:  Distant first and second heart sounds; normal PMI.  LUNGS:  Clear.  ABDOMEN:  Soft and nontender; no organomegaly.  EXTREMITIES:  1-2+ edema on the right; 1+ on the left. Venectomy scar on the   right.   IMPRESSION:  Grant Stevens is doing quite well. Although treatment with a beta  blocker would be desirable in light of his history of left ventricular  dysfunction and myocardial infarction, he did not tolerate even low doses of  carvedilol in the past due to relative hypotension. Otherwise, medications  are ideal. Vaccinations are up to date. I will plan to see this nice  gentleman again in one year.    Sincerely,      Gerrit Friends. Dietrich Pates, MD, [facility]  Electronically Signed    RMR/MedQ  DD: 11/23/2005  DT: 11/23/2005  Job #: [id number]

## 2010-05-28 NOTE — Op Note (Signed)
NAME:  Grant Stevens, Grant Stevens                        ACCOUNT NO.:  [id number]   MEDICAL RECORD NO.:  [id number]                   PATIENT TYPE:  AMB   LOCATION:  DAY                                  FACILITY:  [facility]   PHYSICIAN:  Lionel December, M.D.                 DATE OF BIRTH:  07/04/1940   DATE OF PROCEDURE:  DATE OF DISCHARGE:                                 OPERATIVE REPORT   DESCRIPTION OF PROCEDURE:  Total colonoscopy.   INDICATIONS FOR PROCEDURE:  The patient is a 70 year old Caucasian male who  is undergoing screening colonoscopy.  __________  colorectal carcinoma.  The  procedure was reviewed with the patient and informed consent was obtained.   PREOP MEDICATIONS:  Versed 2 mg IV.   INSTRUMENT USED:  Olympus video system.   DESCRIPTION OF PROCEDURE:  Procedure performed in the endoscopy suite.  The  patient's vital signs and oxygen saturations were monitored during the  procedure.  His systolic blood pressure was low around 90 and therefore no  Demerol was given.  The patient was placed in left lateral decubitus  position.  Rectal examination performed.  No abnormality noted on external  or digital exam.  Scope was placed in the rectum and advanced to the region  of the sigmoid colon and beyond.  Preparation was excellent.  A redundant  colon with fine pigmentation consistent with melanosis coli.  Could not pass  the scope across the hepatic flexure.  The scope was therefore removed and  exchanged for a pediatric scope.  Using different positions and abdominal  pressure, I was finally able to get to the cecum which was identified by the  appendiceal orifice and ileocecal valve.  Pictures taken for the record.  A  small flat polyp at cecum above the appendiceal orifice.  This was ablated  completely by cold biopsy.  Pictures were taken for the record.  As the  scope was withdrawn, the colonic mucosa was once again carefully examined  and no other abnormalities were noted.   The rectal mucosa was normal.  The  scope was retroflexed to examine the anorectum.  Small hemorrhoids were  noted below the dentate line.  The endoscope was straightened and withdrawn.  The patient tolerated the procedure well.   FINAL DIAGNOSES:  1. Examination performed to the cecum.  2. Redundant colon with mild changes of melanosis coli.  Small cecal polyp     ablated by cold biopsy.  3. Small external hemorrhoids.    RECOMMENDATIONS:  High fiber diet.  Citrucel one tablespoon full daily.  He  can continue stool softener as before.  He will resume his ASA other meds as  before.  I will contact the patient with biopsy results and further  recommendations.  Lionel December, M.D.    NR/MEDQ  D:  02/26/2002  T:  02/26/2002  Job:  [id number]   cc:   Kingsley Callander. Ouida Sills, M.D.  [address]  [place]  [place] [address]  Fax: [phone]   Jim Desanctis. Lucina Mellow, M.D.  [phone] [address]  [place]  [place]  [address]  Fax: [phone]

## 2010-05-28 NOTE — Op Note (Signed)
NAME:  Grant Stevens, Grant Stevens              ACCOUNT Grant Stevens.:  [id number]   MEDICAL RECORD Grant Stevens.:  [id number]          PATIENT TYPE:  AMB   LOCATION:  [facility]                         FACILITY:  [facility]   PHYSICIAN:  Ronald L. Ovidio Hanger, M.D.DATE OF BIRTH:  [birth year]   DATE OF PROCEDURE:  10/15/2003  DATE OF DISCHARGE:                                 OPERATIVE REPORT   DIAGNOSIS:  Balanitis.   OPERATIVE PROCEDURE:  Circumcision.   SURGEON:  Gaynelle Arabian, M.D.   ANESTHESIA:  LMA.   ESTIMATED BLOOD LOSS:  10 mL.   TUBES:  None.   COMPLICATIONS:  None.   INDICATIONS FOR PROCEDURE:  [name] is a very nice 70 year old white  male, who has had impotence, uses a vacuum erection device, has had  significant problem with foreskin swelling, edema, and irritation.  He has  tried various agents without much relief and after understanding risks,  benefits, and alternatives, elected to proceed with circumcision.   PROCEDURE IN DETAIL:  The patient was placed in a supine position.  After  proper LMA anesthesia, was draped with Betadine in a sterile fashion.  A  circumferential incision was made in the shaft skin at the appropriate level  and extended to Buck's fascia and the corporal spongiosum.  And a similar  incision was made approximately 2 mm proximal to the corona areata and  extended to the same level.  A dorsal slit was then performed, and the  foreskin was carefully excised utilizing both sharp and blunt dissection.  Good hemostasis was noted to be present.  The shaft skin was then  approximated to the mucosa with running 3-0 chromic catgut.  A dorsal stitch  was placed, and a U-type stitch was placed in the frenular area.  A  frenuloplasty was then performed with a running 3-0 chromic catgut to smooth  out the frenular area.  Good hemostasis was noted to be present.  The wound  was dressed with a Vaseline gauze 4 x 4 sponge and Coban.  He tolerated the  procedure well and was taken to the  recovery room stable.  Foreskin was  submitted for identification to pathology.      RLD/MEDQ  D:  10/15/2003  T:  10/15/2003  Job:  [id number]

## 2010-05-28 NOTE — Consult Note (Signed)
[facility]  Patient:    Grant Stevens, Grant Stevens Visit Number: [id number] MRN: [id number]          Service Type: MED Location: 2A A206 01 Attending Physician:  Carylon Perches Dictated by:   [place] Bing, M.D. Proc. Date: 05/29/01 Admit Date:  05/29/2001                            Consultation Report  REFERRING PHYSICIAN:  Carylon Perches, M.D.  CHIEF COMPLAINT:  A 70 year old gentleman who presents with PND.  HISTORY OF PRESENT ILLNESS:  Grant Stevens has known coronary artery disease, having undergone CABG surgery in August 2002 after presenting with inferior myocardial infarction.  Ejection fraction was normal at that time.  He had no prior history of congestive heart failure and did well postoperatively.  He has been active including working in his garage and around his property until the night of admission when he was awakened from sleep with dyspnea.  He rose from bed resulting in improvement but came to the emergency department where a chest x-ray was interpreted as demonstrating mild congestive heart failure.  A dose of parenteral furosemide was given with instructions to seek cardiology consultation today.  Mr. Favaro denies any chest discomfort.  He has not had progressive symptoms in recent weeks.  He has not noted any weight gain.  He has remained active and has been watching his diet and weight.  CURRENT MEDICATIONS: 1. Ramipril 5 mg q.d. 2. Enteric-coated aspirin 325 mg q.d. 3. Carbamazepine 200 mg q.a.m. and 400 mg q.p.m. 4. Neurontin 100 mg q.i.d. 5. FOLTX and Pravachol 80 mg q.d.  PAST MEDICAL HISTORY:  Notable for bipolar disorder.  There is a history of hypertension that has been well controlled.  He has had elevated hepatic function tests.  There is a history of sleep apnea and asthma.  Hyperlipidemia has been adequately treated.  SOCIAL HISTORY:  Married with no children.  Quit smoking in 1973.  No history of excessive alcohol use.   Works as a Human resources officer.  FAMILY HISTORY:  Positive for myocardial infarction in his father at age 65. No other significant vascular disease in the family.  REVIEW OF SYSTEMS:  Notable for occasional depression and anxiety.  All other systems negative.  PHYSICAL EXAMINATION:  GENERAL:  Well-appearing, tan gentleman in no acute distress.  VITAL SIGNS:  The heart rate is 100 and regular, blood pressure 108/60, respirations 18.  NECK:  No jugular venous distension.  HEENT:  Anicteric sclerae.  CHEST:  Resonant to percussion.  Clear to auscultation.  CARDIOVASCULAR:  Normal first and second heart sounds.  Minimal systolic murmur.  No third heart sound.  ABDOMEN:  Soft and nontender.  No organomegaly.  SKIN:  No significant lesions.  ENDOCRINE:  No thyromegaly.  HEMATOPOIETIC:  No lymphadenopathy.  EXTREMITIES:  Distal pulses intact.  No edema.  NEUROLOGICAL:  Symmetric strength and tone.  LABORATORY DATA:  EKG with normal sinus rhythm, left atrial abnormality, right bundle branch block, prior inferior myocardial infarction.  No change from prior tracing.  Laboratory from ER last night included normal CBC, normal chemistry profile. Normal CPK and troponin.  IMPRESSION:  Mr. Clutter presents with the sudden onset of congestive heart failure.  His left ventricular systolic function has been normal in the past. It is impossible on clinical basis to exclude acute ischemia or even myocardial infarction.  Accordingly, we have recommended a brief hospitalization for a serial  cardiac markers and an echocardiogram.  His dose of ACE inhibitor will be increased.  A daily dose of diuretic will be started. A stress test will be performed to exclude any recurrent myocardial ischemia.Dictated by:   [place] Bing, M.D. Attending Physician:  Carylon Perches DD:  05/29/01 TD:  05/30/01 Job: [id number]

## 2010-05-28 NOTE — Discharge Summary (Signed)
[place]  Patient:    Grant Stevens, Grant Stevens Visit Number: [id number] MRN: [id number]          Service Type: MED Location: 2000 2002 01 Attending Physician:  Waldo Laine Dictated by:   Lissa Merlin, P.A. Admit Date:  08/17/2000 Discharge Date: 08/25/2000   CC:         CVTS office  Arturo Morton. Riley Kill, M.D. [facility]  Dr. Huntley Dec, [place]   Discharge Summary  DATE OF BIRTH:  1940/12/07  SURGEON:  Gwenith Daily. Tyrone Sage, M.D.  CARDIOLOGY:  Arturo Morton. Riley Kill, M.D.  PRIMARY CARE PHYSICIAN:  [place]. [place]ida [place]ills [place]  ADMISSION DIAGNOSES:  Inferior myocardial infarction.  DISCHARGE DIAGNOSES: 1. Inferior myocardial infarction. 2. Three vessel coronary artery disease. 3. Decreased left ventricular function with ejection fraction 55%. 4. Urinary tract infection. 5. Hyponatremia. 6. Anemia. 7. Elevated liver function tests.  PREEXISTING MEDICAL CONDITIONS: 1. Hypertension. 2. Bipolar disorder. 3. Sleep apnea. 4. Asthma. 5. History of smoking. 6. No previous history of coronary artery disease.  PROCEDURES: 1. Cardiac catheterization on August 17, 2000 showing three vessel coronary    artery disease, decreased left ventricular function, and ejection fraction    of 55%. 2. Pre coronary artery bypass grafting Dopplers on August 17, 2000 showing no    internal carotid artery stenosis and ABIs in the lower extremities greater    than 1.0. 3. Coronary artery bypass grafting x 3 on August 21, 2000 with the following    grafts:  Left internal mammary artery to left anterior descending,    saphenous vein graft to posterior descending artery, saphenous vein graft    to diagonal.  BRIEF HISTORY:  The patient is a 70 year old male with no previous coronary history who initially presented after several days of ongoing chest discomfort and shortness of breath.  A cardiogram showed acute myocardial infarction.  He was referred to  [facility] where he underwent cardiac catheterization by Dr. Riley Kill.  Initially Dr. Dorris Fetch reviewed the catheterization data and examined Mr. Peets and agreed that CABG was best treatment.  Risks, benefits, details, and alternatives were discussed and it was agreed to proceed.  This was scheduled for Monday.  Meanwhile, Mr. Dershem was stable on the cardiology service.  He was treated for a UTI with Cipro.  He was also noted to have elevated LFTs.  During this time Dr. Tyrone Sage saw Mr. Devine and agreed to do the surgery instead of Dr. Dorris Fetch.  He underwent CABG x 3 on August 21, 2000.  There were no complications.  He was taken to SICU in stable condition.  Postoperative he did well.  He was suitable for transfer to 2000 postoperative day #1.  He made good progress on 2000 working with cardiac rehabilitation.  He remained in sinus rhythm.  By August 25, 2000 postoperative day #4 he was in normal sinus rhythm, afebrile, vital signs stable, 97% on room air.  He was still volume overloaded 13 pounds.  He had mild postoperative anemia.  LFTs were still a little bit elevated.  Physical examination was satisfactory.  Wounds were healing well.  He was ambulating well.  He was deemed suitable for discharge home and was subsequently discharged with the plan to follow-up LFTs two weeks later with cardiology.  DISCHARGE MEDICATIONS:  1. Enteric coated aspirin 325 mg one p.o. q.d.  2. Tegretol 200 mg q.a.m., 400 mg q.p.m.  3. Folic acid 1 mg p.o. q.d.  4. Niferex  150 one p.o. q.d.  5. Altace 5 mg one p.o. q.d.  6. Lasix 40 mg one p.o. q.d. x 7 days.  7. KCL 20 mEq one p.o. q.d. x 7 days.  8. Colace 200 mg one p.o. q.d.  9. Darvocet-N 100 one to two p.o. q.4-6h. p.r.n. for pain. 10. Pravachol 40 mg one p.o. q.h.s.  ALLERGIES:  No known drug allergies.  CONDITION ON DISCHARGE:  Stable.  SPECIAL INSTRUCTIONS:  He was told to do no driving, no lifting more than 10 pounds,  no strenuous activity.  Low fat, low salt diet.  He was told to clean his wounds daily with soap and water and to be alert for increasing redness, swelling, drainage, or fever and to call the office if he had any questions. He was told to have a chest x-ray taken when he sees his cardiologist in two weeks and to bring it with him to see Dr. Tyrone Sage.  FOLLOW-UP: 1. Dr. Riley Kill two weeks after discharge.  Patient was to call to arrange. 2. Dr. Tyrone Sage Thursday, September 14, 2000 at 9:40 a.m. Dictated by:   Lissa Merlin, P.A. Attending Physician:  Waldo Laine DD:  09/08/00 TD:  09/08/00 Job: [id number]

## 2010-05-28 NOTE — Discharge Summary (Signed)
[facility]  Patient:    ALEKSANDR, PELLOW Visit Number: [id number] MRN: [id number]          Service Type: OUT Location: RAD Attending Physician:  Nelta Numbers Dictated by:   Carylon Perches, M.D. Admit Date:  06/19/2001 Discharge Date: 06/19/2001                             Discharge Summary  DISCHARGE DIAGNOSES: 1. Congestive heart failure. 2. Coronary artery disease status post coronary artery bypass graft. 3. Bipolar disorder. 4. Hypertension. 5. Obstructive sleep apnea. 6. Hyperlipidemia. 7. History of asthma.  HOSPITAL COURSE:  The patient is a 70 year old white male status post coronary artery bypass graft, August 2002, who presented to the cardiology clinic after experiencing shortness of breath and paroxysmal nocturnal dyspnea.  He had been treated in the emergency room 1 day prior to admission with IV Lasix and discharged.  He had felt much better after this.  He was hospitalized and treated with IV Lasix.  His cardiac enzymes were normal.  His EKG revealed normal sinus rhythm with a right bundle-branch block.  He was treated Altace also.  An echocardiogram revealed a decreased ejection fraction in the 35% range.  Aldactone was added.  He underwent a cardiolite stress test which revealed was normal.  His ejection fraction was estimated at 65% on that study.  His repeat chest x-ray revealed improved aeration with no evidence of pulmonary edema.  His BUN and creatinine were 17 and 1.3 with a potassium of 4.3.  He improved promptly and was stable for discharge on May 31, 2001.  DISCHARGE MEDICATIONS: 1. Lasix 40 mg q. day. 2. Spironolactone 25 mg q. day. 3. Altace 10 mg q. day. 4. ______ q. day. 5. Aspirin q. day. 6. Neurontin 100 mg q.i.d. 7. Tegretol 200 mg q.a.m. and 400 mg q.p.m. 8. Pravachol 80 mg q.d.  FOLLOWUP:  The patient will be seen in my office in 2 weeks and will have a Met-7 in 1 week.  Arrangements were made  for cardiology follow up on Jun 05, 2001, at 10 a.m. Dictated by:   Carylon Perches, M.D. Attending Physician:  Nelta Numbers DD:  06/29/01 TD:  06/30/01 Job: [id number]

## 2010-05-28 NOTE — Procedures (Signed)
[facility]  Patient:    Grant Stevens, Grant Stevens Visit Number: [id number] MRN: [id number]          Service Type: MED Location: 2A A206 01 Attending Physician:  Carylon Perches Dictated by:   [place] Bing, M.D. Proc. Date: 05/29/01 Admit Date:  05/29/2001                              Echocardiograms  REFERRING PHYSICIANS: 1. Carylon Perches, M.D. 2. [place] Bing, M.D.  CLINICAL DATA:  [age]-year-old gentleman with prior CABG surgery; now with congestive heart failure.  IMPRESSION: 1. Technically difficult and somewhat limited echocardiographic study. 2. Mild left atrial enlargement; normal right atrial size.  Normal right    ventricular size with probably normal systolic function. 3. Mild aortic valvular sclerosis with annular calcification. 4. Normal mitral valve with mild-to-moderate annular calcification. 5. Normal tricuspid valve. 6. Normal inferior vena cava. 7. Left ventricular size at the upper limit of normal to mildly increased.    Borderline left ventricular hypertrophy with mild global hypokinesis; the    inferior wall is akinetic to mildly dyskinetic.  Overall function is    moderately impaired with an estimated ejection fraction of 0.35. 8. Doppler evidence for decreased left ventricular compliance. Dictated by:   [place] Bing, M.D. Attending Physician:  Carylon Perches DD:  05/29/01 TD:  05/31/01 Job: [id number]

## 2010-08-06 ENCOUNTER — Other Ambulatory Visit: Payer: Self-pay | Admitting: Cardiology

## 2010-09-08 ENCOUNTER — Encounter: Payer: Self-pay | Admitting: Cardiology

## 2010-09-09 ENCOUNTER — Encounter: Payer: Medicare Other | Admitting: Cardiology

## 2010-09-09 ENCOUNTER — Encounter: Payer: Self-pay | Admitting: Cardiology

## 2010-09-15 NOTE — Progress Notes (Signed)
Patient waited >1hr and was offered the opportunity to return for a subsequent appointment at no charge.

## 2010-09-22 ENCOUNTER — Encounter: Payer: Self-pay | Admitting: Physician Assistant

## 2010-09-22 ENCOUNTER — Ambulatory Visit (INDEPENDENT_AMBULATORY_CARE_PROVIDER_SITE_OTHER): Payer: Medicare Other | Admitting: Physician Assistant

## 2010-09-22 ENCOUNTER — Ambulatory Visit: Payer: Medicare Other | Admitting: Cardiology

## 2010-09-22 DIAGNOSIS — E785 Hyperlipidemia, unspecified: Secondary | ICD-10-CM

## 2010-09-22 DIAGNOSIS — I10 Essential (primary) hypertension: Secondary | ICD-10-CM | POA: Insufficient documentation

## 2010-09-22 DIAGNOSIS — I2510 Atherosclerotic heart disease of native coronary artery without angina pectoris: Secondary | ICD-10-CM

## 2010-09-22 NOTE — Assessment & Plan Note (Signed)
Patient has history of CABG in 2002 after presenting with an inferior wall MI. His LV function has recovered. He is doing well without angina.

## 2010-09-22 NOTE — Assessment & Plan Note (Signed)
Patient's lipid profile has been stable over the past 3 years. He has follow-up labs scheduled with Dr. Ouida Sills in December.

## 2010-09-22 NOTE — Progress Notes (Signed)
HPI: This is a 70 year old white male patient who has history of coronary artery disease status post CABG in 2002 with a LIMA to the LAD, SVG to the PDA, SVG to the diagonal. Ejection fraction was 50% with akinesis of the basal inferior and anteroseptal segments on echo in 2009.  The patient is here today for yearly follow-up. He denies chest pain, palpitations, dyspnea, dyspnea on exertion, dizziness, or presyncope. 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  No Known Allergies  Current Outpatient Prescriptions on File Prior to Visit  Medication Sig Dispense Refill  . aspirin (BAYER LOW STRENGTH) 81 MG EC tablet Take 81 mg by mouth daily.        Marland Kitchen atorvastatin (LIPITOR) 80 MG tablet Take 80 mg by mouth at bedtime.        . carbamazepine (TEGRETOL) 200 MG tablet Take 200 mg by mouth 2 (two) times daily.        . clonazePAM (KLONOPIN) 1 MG tablet Take 0.5 mg by mouth 2 (two) times daily.        Marland Kitchen EPINEPHrine (EPIPEN) 0.3 mg/0.3 mL DEVI Inject 0.3 mg into the muscle as needed.        . furosemide (LASIX) 40 MG tablet        . lisinopril (PRINIVIL,ZESTRIL) 10 MG tablet Take 10 mg by mouth daily.        . risperiDONE (RISPERDAL) 1 MG tablet Take 1 mg by mouth 2 (two) times daily.          Past Medical History  Diagnosis Date  . Hyponatremia     Serum sodium of 126 on 04/26/10  . ASCVD (arteriosclerotic cardiovascular disease)     CABG in 06/2001 [place] inferior hycardial infarction, EF of 35% increase to 50% postoperatively; enrolled in WARCEF study  . Hypertension   . Hyperlipidemia   . Tobacco abuse     Discontinued  . Sleep apnea   . History of bipolar disorder   . Allergic rhinitis   . Asthma   . Erectile dysfunction     Past Surgical History  Procedure Date  . Coronary artery bypass graft 06/2001    By Dr. Tyrone Sage  . Tonsillectomy   . Circumcision 10/2003    Family History  Problem Relation Age of Onset  . Asthma Mother   . Heart  attack Father 93  . Diabetes Brother     History   Social History  . Marital Status: Married    Spouse Name: N/A    Number of Children: N/A  . Years of Education: N/A   Occupational History  . Not on file.   Social History Main Topics  . Smoking status: Former Smoker -- 1.0 packs/day for 15 years    Quit date: 01/11/1971  . Smokeless tobacco: Not on file  . Alcohol Use: No  . Drug Use: Not on file  . Sexually Active: Not on file   Other Topics Concern  . Not on file   Social History Narrative   Married with no children Exercises 4 times weeklyCaffeine once a day    ROS: See HPI Eyes:glasses Ears:Negative for hearing loss, tinnitus Cardiovascular: Negative for chest pain, palpitations,irregular heartbeat, dyspnea, dyspnea on exertion, near-syncope, orthopnea, paroxysmal nocturnal dyspnia and syncope,edema, claudication, cyanosis,.  Respiratory:   Negative for cough, hemoptysis, shortness of breath, sleep disturbances due to breathing, sputum production and wheezing.   Endocrine: Negative for cold intolerance and heat intolerance.  Hematologic/Lymphatic: Negative for adenopathy and bleeding problem. Does not bruise/bleed easily.  Musculoskeletal: Negative.   Gastrointestinal: Negative for nausea, vomiting, reflux, abdominal pain, diarrhea, constipation.   Neurological: Negative.  Allergic/Immunologic: Negative for environmental allergies.   PHYSICAL EXAM: Well-nournished, in no acute distress. Neck: No JVD, HJR, Bruit, or thyroid enlargement Lungs: No tachypnea, clear without wheezing, rales, or rhonchi Cardiovascular: RRR, PMI not displaced, positive S4, 2/6 systolic murmur at the left sternal border, no bruit, thrill, or heave. Abdomen: BS normal. Soft without organomegaly, masses, lesions or tenderness. Extremities: without cyanosis, clubbing or edema. Good distal pulses bilateral SKin: Warm, no lesions or rashes  Musculoskeletal: No deformities Neuro: no focal  signs  BP 166/90  Pulse 77  Resp 18  Ht 6\' 2"  (1.88 m)  Wt 223 lb 1.9 oz (101.207 kg)  BMI 28.65 kg/m2  SpO2 97%

## 2010-09-22 NOTE — Assessment & Plan Note (Signed)
Well controlled 

## 2010-09-22 NOTE — Patient Instructions (Signed)
Your physician you to follow up in 1 year. You will receive a reminder letter in the mail one-two months in advance. If you don't receive a letter, please call our office to schedule the follow-up appointment. Your physician recommends that you continue on your current medications as directed. Please refer to the Current Medication list given to you today. 

## 2010-11-18 ENCOUNTER — Encounter: Payer: Self-pay | Admitting: Internal Medicine

## 2010-11-19 ENCOUNTER — Encounter: Payer: Self-pay | Admitting: Internal Medicine

## 2010-11-19 ENCOUNTER — Ambulatory Visit: Payer: Medicare Other

## 2010-11-19 ENCOUNTER — Ambulatory Visit (INDEPENDENT_AMBULATORY_CARE_PROVIDER_SITE_OTHER): Payer: Medicare Other | Admitting: Internal Medicine

## 2010-11-19 VITALS — BP 162/90 | HR 73 | Ht 74.0 in | Wt 225.0 lb

## 2010-11-19 DIAGNOSIS — J309 Allergic rhinitis, unspecified: Secondary | ICD-10-CM

## 2010-11-19 DIAGNOSIS — G4730 Sleep apnea, unspecified: Secondary | ICD-10-CM

## 2010-11-19 DIAGNOSIS — J45909 Unspecified asthma, uncomplicated: Secondary | ICD-10-CM

## 2010-11-19 NOTE — Progress Notes (Signed)
11/19/10- 70 year old male former smoker followed for allergic rhinitis, asthma, complicated by CAD/MI/CABG, bipolar  PCP Dr Carylon Perches LOV-11/20/2009 He stopped allergy vaccine 6 months ago and has had no problems so far. He is not using CPAP and says he sleeps comfortably. He will occasionally wake early in the morning but accepts this is an important. He is not needing temazepam.  ROS-see HPI Constitutional:   No-   weight loss, night sweats, fevers, chills, fatigue, lassitude. HEENT:   No-  headaches, difficulty swallowing, tooth/dental problems, sore throat,       No-  sneezing, itching, ear ache, nasal congestion, post nasal drip,  CV:  No-   chest pain, orthopnea, PND, swelling in lower extremities, anasarca,                                  dizziness, palpitations Resp: No-   shortness of breath with exertion or at rest.              No-   productive cough,  No non-productive cough,  No- coughing up of blood.              No-   change in color of mucus.  No- wheezing.   Skin: No-   rash or lesions. GI:  No-   heartburn, indigestion, abdominal pain, nausea, vomiting, diarrhea,                 change in bowel habits, loss of appetite GU: No-   dysuria, change in color of urine, no urgency or frequency.  No- flank pain. MS:  No-   joint pain or swelling.  No- decreased range of motion.  No- back pain. Neuro-     nothing unusual Psych:  No- change in mood or affect. No depression or anxiety.  No memory loss.  OBJ General- Alert, Oriented, Affect-appropriate, Distress- none acute Skin- rash-none, lesions- none, excoriation- none Lymphadenopathy- none Head- atraumatic            Eyes- Gross vision intact, PERRLA, conjunctivae clear secretions            Ears- Hearing, canals-normal            Nose- Clear, no-Septal dev, mucus, polyps, erosion, perforation             Throat- Mallampati II , mucosa clear , drainage- none, tonsils- atrophic Neck- flexible , trachea midline, no stridor ,  thyroid nl, carotid no bruit Chest - symmetrical excursion , unlabored           Heart/CV- RRR , no murmur , no gallop  , no rub, nl s1 s2                           - JVD- none , edema- none, stasis changes- none, varices- none           Lung- clear to P&A, wheeze- none, cough- none , dullness-none, rub- none           Chest wall-  Abd- tender-no, distended-no, bowel sounds-present, HSM- no Br/ Gen/ Rectal- Not done, not indicated Extrem- cyanosis- none, clubbing, none, atrophy- none, strength- nl Neuro- grossly intact to observation

## 2010-11-19 NOTE — Patient Instructions (Signed)
We have stopped allergy vaccine  Dr Ouida Sills can refill your temazepam for sleep and your rescue inhaler for asthma if needed  Order- CXR    [facility]

## 2010-11-23 NOTE — Assessment & Plan Note (Signed)
Will watch his status off of allergy vaccine. Our first test may not come before spring. He understands use of symptomatic medications.

## 2010-11-23 NOTE — Assessment & Plan Note (Signed)
He is satisfied with his quality of sleep not using CPAP.

## 2011-02-11 DIAGNOSIS — F3110 Bipolar disorder, current episode manic without psychotic features, unspecified: Secondary | ICD-10-CM | POA: Diagnosis not present

## 2011-02-15 ENCOUNTER — Encounter: Payer: Self-pay | Admitting: Cardiology

## 2011-04-12 ENCOUNTER — Other Ambulatory Visit: Payer: Self-pay | Admitting: Adult Health

## 2011-04-15 DIAGNOSIS — F3110 Bipolar disorder, current episode manic without psychotic features, unspecified: Secondary | ICD-10-CM | POA: Diagnosis not present

## 2011-05-06 DIAGNOSIS — Z79899 Other long term (current) drug therapy: Secondary | ICD-10-CM | POA: Diagnosis not present

## 2011-05-13 DIAGNOSIS — I2510 Atherosclerotic heart disease of native coronary artery without angina pectoris: Secondary | ICD-10-CM | POA: Diagnosis not present

## 2011-05-13 DIAGNOSIS — I509 Heart failure, unspecified: Secondary | ICD-10-CM | POA: Diagnosis not present

## 2011-05-23 DIAGNOSIS — H00029 Hordeolum internum unspecified eye, unspecified eyelid: Secondary | ICD-10-CM | POA: Diagnosis not present

## 2011-05-23 DIAGNOSIS — H5710 Ocular pain, unspecified eye: Secondary | ICD-10-CM | POA: Diagnosis not present

## 2011-06-16 DIAGNOSIS — Z85828 Personal history of other malignant neoplasm of skin: Secondary | ICD-10-CM | POA: Diagnosis not present

## 2011-06-16 DIAGNOSIS — C4441 Basal cell carcinoma of skin of scalp and neck: Secondary | ICD-10-CM | POA: Diagnosis not present

## 2011-06-16 DIAGNOSIS — D235 Other benign neoplasm of skin of trunk: Secondary | ICD-10-CM | POA: Diagnosis not present

## 2011-06-16 DIAGNOSIS — L570 Actinic keratosis: Secondary | ICD-10-CM | POA: Diagnosis not present

## 2011-08-01 DIAGNOSIS — R0989 Other specified symptoms and signs involving the circulatory and respiratory systems: Secondary | ICD-10-CM | POA: Diagnosis not present

## 2011-08-01 DIAGNOSIS — R0609 Other forms of dyspnea: Secondary | ICD-10-CM | POA: Diagnosis not present

## 2011-08-02 ENCOUNTER — Other Ambulatory Visit (HOSPITAL_COMMUNITY): Payer: Self-pay | Admitting: Internal Medicine

## 2011-08-02 ENCOUNTER — Ambulatory Visit (HOSPITAL_COMMUNITY)
Admission: RE | Admit: 2011-08-02 | Discharge: 2011-08-02 | Disposition: A | Discharge status: Home / Self Care | Payer: Medicare Other | Source: Ambulatory Visit | Attending: Internal Medicine | Admitting: Internal Medicine

## 2011-08-02 DIAGNOSIS — R0602 Shortness of breath: Secondary | ICD-10-CM

## 2011-08-02 DIAGNOSIS — Z79899 Other long term (current) drug therapy: Secondary | ICD-10-CM | POA: Diagnosis not present

## 2011-09-16 DIAGNOSIS — F3110 Bipolar disorder, current episode manic without psychotic features, unspecified: Secondary | ICD-10-CM | POA: Diagnosis not present

## 2011-09-16 DIAGNOSIS — Z79899 Other long term (current) drug therapy: Secondary | ICD-10-CM | POA: Diagnosis not present

## 2011-09-23 DIAGNOSIS — I2510 Atherosclerotic heart disease of native coronary artery without angina pectoris: Secondary | ICD-10-CM | POA: Diagnosis not present

## 2011-09-23 DIAGNOSIS — I509 Heart failure, unspecified: Secondary | ICD-10-CM | POA: Diagnosis not present

## 2011-09-26 ENCOUNTER — Encounter: Payer: Self-pay | Admitting: Cardiology

## 2011-09-26 ENCOUNTER — Ambulatory Visit (INDEPENDENT_AMBULATORY_CARE_PROVIDER_SITE_OTHER): Payer: Medicare Other | Admitting: Cardiology

## 2011-09-26 VITALS — BP 116/68 | HR 69 | Ht 74.0 in | Wt 223.0 lb

## 2011-09-26 DIAGNOSIS — I10 Essential (primary) hypertension: Secondary | ICD-10-CM

## 2011-09-26 DIAGNOSIS — E871 Hypo-osmolality and hyponatremia: Secondary | ICD-10-CM | POA: Insufficient documentation

## 2011-09-26 DIAGNOSIS — I2510 Atherosclerotic heart disease of native coronary artery without angina pectoris: Secondary | ICD-10-CM

## 2011-09-26 DIAGNOSIS — Z8679 Personal history of other diseases of the circulatory system: Secondary | ICD-10-CM | POA: Insufficient documentation

## 2011-09-26 DIAGNOSIS — E785 Hyperlipidemia, unspecified: Secondary | ICD-10-CM

## 2011-09-26 DIAGNOSIS — I7090 Unspecified atherosclerosis: Secondary | ICD-10-CM | POA: Diagnosis not present

## 2011-09-26 DIAGNOSIS — I4891 Unspecified atrial fibrillation: Secondary | ICD-10-CM

## 2011-09-26 NOTE — Assessment & Plan Note (Signed)
Most recent lipid profile available to me from late 2011 was excellent.  Dr. Ouida Sills has told [name] that a repeat lipid profile will be obtained at the patient's next visit.  Appropriate adjustment in therapy, if necessary, can be made at that time.

## 2011-09-26 NOTE — Assessment & Plan Note (Signed)
Atrial fibrillation has not been documented for years.  There is no indication that anticoagulation/antithrombotic therapy other than aspirin is warranted.

## 2011-09-26 NOTE — Progress Notes (Deleted)
Name: Grant Stevens    DOB: 02-Jul-1940  Age: 71 y.o.  MR#: [id number]       PCP:  Carylon Perches, MD      Insurance: @PAYORNAME @   CC:    Chief Complaint  Patient presents with  . Appointment    shob,     VS BP 116/68  Pulse 69  Ht 6\' 2"  (1.88 m)  Wt 223 lb (101.152 kg)  BMI 28.63 kg/m2  Weights Current Weight  09/26/11 223 lb (101.152 kg)  11/19/10 225 lb (102.059 kg)  09/22/10 223 lb 1.9 oz (101.207 kg)    Blood Pressure  BP Readings from Last 3 Encounters:  09/26/11 116/68  11/19/10 162/90  09/22/10 166/90     Admit date:  (Not on file) Last encounter with RMR:  Visit date not found   Allergy No Known Allergies  Current Outpatient Prescriptions  Medication Sig Dispense Refill  . albuterol (PROVENTIL HFA;VENTOLIN HFA) 108 (90 BASE) MCG/ACT inhaler Inhale 2 puffs into the lungs every 6 (six) hours as needed.        Marland Kitchen aspirin (BAYER LOW STRENGTH) 81 MG EC tablet Take 81 mg by mouth daily.        Marland Kitchen atorvastatin (LIPITOR) 80 MG tablet Take 80 mg by mouth at bedtime.        . carbamazepine (TEGRETOL) 200 MG tablet Take 200 mg by mouth 2 (two) times daily.        . furosemide (LASIX) 40 MG tablet       . lisinopril (PRINIVIL,ZESTRIL) 10 MG tablet Take 10 mg by mouth daily.        . risperiDONE (RISPERDAL) 1 MG tablet Take 1 mg by mouth daily.       Marland Kitchen DISCONTD: LASIX 40 MG tablet TAKE ONE TABLET BY MOUTH ONCE DAILY.  30 each  6  . DISCONTD: furosemide (LASIX) 40 MG tablet Take 40 mg by mouth daily.         Discontinued Meds:    Medications Discontinued During This Encounter  Medication Reason  . furosemide (LASIX) 40 MG tablet Duplicate  . LASIX 40 MG tablet   . temazepam (RESTORIL) 15 MG capsule Completed Course    Patient Active Problem List  Diagnosis  . HYPERLIPIDEMIA  . ALLERGIC RHINITIS  . ASTHMA  . SLEEP APNEA  . BIPOLAR AFFECTIVE DISORDER, HX OF  . Hypertension  . Hyponatremia  . ASCVD (arteriosclerotic cardiovascular disease)  . Atrial fibrillation      LABS No visits with results within 3 Month(s) from this visit. Latest known visit with results is:  [facility] Conversion Encounter on 11/30/2009  Component Date Value  . Cholesterol 11/30/2009 135   . Triglycerides 11/30/2009 62   . HDL 11/30/2009 47   . Total CHOL/HDL Ratio 11/30/2009 2.9 Ratio   . VLDL 11/30/2009 12   . LDL Cholesterol 11/30/2009 76      Results for this Opt Visit:     Results for orders placed in visit on 11/30/09  CONVERTED [facility] LAB      Component Value Range   Cholesterol 135  0-200 mg/dL   Triglycerides 62  <119 mg/dL   HDL 47  >14 mg/dL   Total CHOL/HDL Ratio 2.9 Ratio     VLDL 12  0-40 mg/dL   LDL Cholesterol 76  7-82 mg/dL    EKG Orders placed in visit on 11/28/08  . CONVERTED [facility] EKG     Prior Assessment and Plan Problem List as of  09/26/2011            Cardiology Problems   HYPERLIPIDEMIA   Last Assessment & Plan Note   09/22/2010 Office Visit Signed 09/22/2010 11:21 AM by Dyann Kief, PA    Patient's lipid profile has been stable over the past 3 years. He has follow-up labs scheduled with Dr. Ouida Sills in December.    Hypertension   Last Assessment & Plan Note   09/22/2010 Office Visit Signed 09/22/2010 11:21 AM by Dyann Kief, PA    Well controlled    ASCVD (arteriosclerotic cardiovascular disease)   Atrial fibrillation     Other   ALLERGIC RHINITIS   Last Assessment & Plan Note   11/19/2010 Office Visit Signed 11/23/2010  9:49 PM by Waymon Budge, MD    Will watch his status off of allergy vaccine. Our first test may not come before spring. He understands use of symptomatic medications.    ASTHMA   SLEEP APNEA   Last Assessment & Plan Note   11/19/2010 Office Visit Signed 11/23/2010  9:49 PM by Waymon Budge, MD    He is satisfied with his quality of sleep not using CPAP.    BIPOLAR AFFECTIVE DISORDER, HX OF   Hyponatremia       Imaging: No results found.   FRS Calculation: Score not calculated. Missing: Total  Cholesterol

## 2011-09-26 NOTE — Assessment & Plan Note (Addendum)
Patient remains asymptomatic as has been the case for the past 10 years.  We will continue our efforts to optimize management of cardiovascular risk factors.

## 2011-09-26 NOTE — Patient Instructions (Addendum)
Your physician recommends that you schedule a follow-up appointment in: 1 year  Weigh 2 x weekly and call us for weight gain of 5 pounds

## 2011-09-26 NOTE — Progress Notes (Signed)
Patient ID: Grant Stevens, male   DOB: 25-May-1940, 71 y.o.   MRN: [id number]  HPI: Scheduled return visit for this very nice gentleman with a history of coronary artery disease and multiple cardiovascular risk factors.  Since suffering an acute inferior MI and subsequently undergoing CABG surgery approximately a decade ago, he has done superbly.  There is a remote history of congestive heart failure, but no cardiopulmonary symptoms in recent years.  He reported mild exertional dyspnea to Dr. Ouida Sills a few months ago, but testing including a BNP level, d-dimer and chest x-ray, was normal.  Currently, he is exercising at a local gym without difficulty.  Prior to Admission medications   Medication Sig Start Date End Date Taking? Authorizing Provider  albuterol (PROVENTIL HFA;VENTOLIN HFA) 108 (90 BASE) MCG/ACT inhaler Inhale 2 puffs into the lungs every 6 (six) hours as needed.     Yes Historical Provider, MD  aspirin (BAYER LOW STRENGTH) 81 MG EC tablet Take 81 mg by mouth daily.     Yes Historical Provider, MD  atorvastatin (LIPITOR) 80 MG tablet Take 80 mg by mouth at bedtime.     Yes Historical Provider, MD  carbamazepine (TEGRETOL) 200 MG tablet Take 200 mg by mouth 2 (two) times daily.     Yes Historical Provider, MD  furosemide (LASIX) 40 MG tablet  04/12/11  Yes Jodelle Gross, NP  lisinopril (PRINIVIL,ZESTRIL) 10 MG tablet Take 10 mg by mouth daily.     Yes Historical Provider, MD  risperiDONE (RISPERDAL) 1 MG tablet Take 1 mg by mouth daily.    Yes Historical Provider, MD   No Known Allergies    Past medical history, social history, and family history reviewed and updated.  ROS: Denies orthopnea, PND, lightheadedness or syncope.  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  All other systems reviewed and are negative.  PHYSICAL EXAM: BP 116/68  Pulse 69  Ht 6\' 2"  (1.88 m)  Wt 101.152 kg (223 lb)  BMI 28.63 kg/m2  General-Well developed; no acute distress Body  habitus-Mildly overweight Neck-No JVD; no carotid bruits Lungs-clear lung fields; resonant to percussion Cardiovascular-normal PMI; normal S1 and S2; prominent S4 Abdomen-normal bowel sounds; soft and non-tender without masses or organomegaly Musculoskeletal-No deformities, no cyanosis or clubbing Neurologic-Normal cranial nerves; symmetric strength and tone Skin-Warm, no significant lesions Extremities-distal pulses intact; no edema  EKG:  Normal sinus rhythm, first-degree AV block, right bundle branch block, prior inferior myocardial infarction, low voltage.  No previous tracing for comparison.  ASSESSMENT AND PLAN:  [name], MD 09/26/2011 2:23 PM

## 2011-09-26 NOTE — Assessment & Plan Note (Signed)
Recent blood pressure determinations performed by the patient has been excellent as is BP measured in the office today.  Current therapy will be continued.

## 2011-09-26 NOTE — Assessment & Plan Note (Signed)
Hyponatremia has improved following a decrease in the patient's dose of furosemide, currently at a very low level of 20 mg per day.  Diuretic is used because of a remote history of congestive heart failure and may not be absolutely necessary; however, since patient is tolerating current medications and doing very well with this regimen, low dose furosemide will be continued.

## 2012-02-21 DIAGNOSIS — F3110 Bipolar disorder, current episode manic without psychotic features, unspecified: Secondary | ICD-10-CM | POA: Diagnosis not present

## 2012-02-25 ENCOUNTER — Observation Stay (HOSPITAL_COMMUNITY)
Admission: EM | Admit: 2012-02-25 | Discharge: 2012-02-25 | Disposition: A | Discharge status: Home / Self Care | Payer: Medicare Other | Attending: Emergency Medicine | Admitting: Emergency Medicine

## 2012-02-25 ENCOUNTER — Encounter (HOSPITAL_COMMUNITY): Payer: Self-pay | Admitting: Emergency Medicine

## 2012-02-25 DIAGNOSIS — F319 Bipolar disorder, unspecified: Secondary | ICD-10-CM | POA: Diagnosis not present

## 2012-02-25 DIAGNOSIS — I10 Essential (primary) hypertension: Secondary | ICD-10-CM | POA: Diagnosis not present

## 2012-02-25 DIAGNOSIS — J45909 Unspecified asthma, uncomplicated: Secondary | ICD-10-CM | POA: Diagnosis not present

## 2012-02-25 LAB — CBC
MCV: 92.4 fL (ref 78.0–100.0)
RBC: 4.2 MIL/uL — ABNORMAL LOW (ref 4.22–5.81)
RDW: 12.9 % (ref 11.5–15.5)
WBC: 8.5 10*3/uL (ref 4.0–10.5)

## 2012-02-25 LAB — COMPREHENSIVE METABOLIC PANEL
BUN: 19 mg/dL (ref 6–23)
CO2: 26 mEq/L (ref 19–32)
Calcium: 9.5 mg/dL (ref 8.4–10.5)
Chloride: 99 mEq/L (ref 96–112)
GFR calc non Af Amer: 60 mL/min — ABNORMAL LOW (ref >90)
Glucose, Bld: 149 mg/dL — ABNORMAL HIGH (ref 70–99)
Potassium: 4.8 mEq/L (ref 3.5–5.1)
Sodium: 134 mEq/L — ABNORMAL LOW (ref 135–145)

## 2012-02-25 LAB — ETHANOL: Alcohol, Ethyl (B): <11 mg/dL (ref 0–11)

## 2012-02-25 NOTE — BH Assessment (Signed)
[facility] Assessment Progress Note      02/25/2012 Contacted  Health to request a consult as the tele-psych machine is currently down. Dr. Dan Humphreys did consult with Dr. Hyacinth Meeker; has requested a Tegretol level and to increase the Respiradol.  Contacted [facility]; informed them of the patient's status. Star, from the answering service, stated that Dr. Betti Cruz would be calling back to the ED. Requested that the patient be seen sometime next week, if possible, for follow up.  Shon Baton, MSW, LCSW, LCASA, CSW-G

## 2012-02-25 NOTE — ED Provider Notes (Signed)
History     CSN: [id number]  Arrival date & time 02/25/12  1540   First MD Initiated Contact with Patient 02/25/12 1628      Chief Complaint  Patient presents with  . Medical Clearance    (Consider location/radiation/quality/duration/timing/severity/associated sxs/prior treatment) HPI Comments: 72 year old male with a history of bipolar disorder as well as hyponatremia who presents with a complaint of increased manic episodes. According to the patient and his wife he has been having pressured speech, not sleeping very well and getting him very early in the last several mornings. He states that he is depressed but is not having any thoughts of suicide or physical injury to others or self. He denies hallucinations. The symptoms are gradually getting worse, they are now moderate to severe, he denies any physical symptoms including chest pain shortness of breath headache blurred vision weakness numbness vomiting diarrhea or dysuria. His medications were recently changed from taking Risperdal only at night to taking it milligram in the morning and half milligram at night. It is not see his psychiatrist again until May.  The history is provided by the patient and the spouse.    Past Medical History  Diagnosis Date  . Hyponatremia     Serum sodium of 126 on 04/26/10  . ASCVD (arteriosclerotic cardiovascular disease)     CABG in 06/2001 post-IMI, EF 35%, 50% postoperatively;  . Hypertension   . Hyperlipidemia   . Tobacco abuse, in remission     15 pack years; discontinued in 1973  . Sleep apnea   . History of bipolar disorder   . Allergic rhinitis   . Asthma   . Erectile dysfunction   . Atrial fibrillation      WARCEF    Past Surgical History  Procedure Laterality Date  . Coronary artery bypass graft  06/2001    By Dr. Tyrone Sage  . Tonsillectomy    . Circumcision  10/2003    Family History  Problem Relation Age of Onset  . Asthma Mother   . Heart attack Father 22  . Diabetes  Brother     History  Substance Use Topics  . Smoking status: Former Smoker -- 1.00 packs/day for 15 years    Quit date: 01/11/1971  . Smokeless tobacco: Not on file  . Alcohol Use: No      Review of Systems  All other systems reviewed and are negative.    Allergies  Review of patient's allergies indicates no known allergies.  Home Medications   Current Outpatient Rx  Name  Route  Sig  Dispense  Refill  . aspirin (BAYER LOW STRENGTH) 81 MG EC tablet   Oral   Take 81 mg by mouth daily.           Marland Kitchen atorvastatin (LIPITOR) 80 MG tablet   Oral   Take 80 mg by mouth at bedtime.           . carbamazepine (TEGRETOL) 200 MG tablet   Oral   Take 400 mg by mouth 2 (two) times daily.          Marland Kitchen docusate sodium (COLACE) 100 MG capsule   Oral   Take 100 mg by mouth daily.         . furosemide (LASIX) 20 MG tablet   Oral   Take 10 mg by mouth daily.          Marland Kitchen lisinopril (PRINIVIL,ZESTRIL) 10 MG tablet   Oral   Take 10 mg by mouth daily.           Marland Kitchen  risperiDONE (RISPERDAL) 1 MG tablet   Oral   Take 0.5 mg by mouth 2 (two) times daily.          Marland Kitchen albuterol (PROVENTIL HFA;VENTOLIN HFA) 108 (90 BASE) MCG/ACT inhaler   Inhalation   Inhale 2 puffs into the lungs every 6 (six) hours as needed for shortness of breath.            BP 154/86  Pulse 106  Temp(Src) 97.8 F (36.6 C) (Oral)  Resp 16  SpO2 98%  Physical Exam  Nursing note and vitals reviewed. Constitutional: He appears well-developed and well-nourished. No distress.  HENT:  Head: Normocephalic and atraumatic.  Mouth/Throat: Oropharynx is clear and moist. No oropharyngeal exudate.  Eyes: Conjunctivae and EOM are normal. Pupils are equal, round, and reactive to light. Right eye exhibits no discharge. Left eye exhibits no discharge. No scleral icterus.  Neck: Normal range of motion. Neck supple. No JVD present. No thyromegaly present.  Cardiovascular: Normal rate, regular rhythm, normal heart  sounds and intact distal pulses.  Exam reveals no gallop and no friction rub.   No murmur heard. Pulmonary/Chest: Effort normal and breath sounds normal. No respiratory distress. He has no wheezes. He has no rales.  Abdominal: Soft. Bowel sounds are normal. He exhibits no distension and no mass. There is no tenderness.  Musculoskeletal: Normal range of motion. He exhibits no edema and no tenderness.  Lymphadenopathy:    He has no cervical adenopathy.  Neurological: He is alert. Coordination normal.  Skin: Skin is warm and dry. No rash noted. No erythema.  Psychiatric:  Pleasant, pressured speech No hallucinations, no suicidal thoughts    ED Course  Procedures (including critical care time)  Labs Reviewed  CBC - Abnormal; Notable for the following:    RBC 4.20 (*)    HCT 38.8 (*)    All other components within normal limits  COMPREHENSIVE METABOLIC PANEL - Abnormal; Notable for the following:    Sodium 134 (*)    Glucose, Bld 149 (*)    AST 45 (*)    Alkaline Phosphatase 125 (*)    Total Bilirubin 0.2 (*)    GFR calc non Af Amer 60 (*)    GFR calc Af Amer 69 (*)    All other components within normal limits  ETHANOL  CARBAMAZEPINE LEVEL, TOTAL  URINE RAPID DRUG SCREEN (HOSP PERFORMED)  URINALYSIS, ROUTINE W REFLEX MICROSCOPIC   No results found.   1. Bipolar disorder       MDM  Is very difficult to get the patient to stop talking, he continues to explain things of minor importance, he states that he does not feel like he needs to be admitted to the hospital but needs a change in his medications so he can get out of his manic phase. Will discuss with act team.   D/w Dr. Dan Humphreys - recommends increasing risperdal to 1mg  bid and tegretol level, if below 6, may need increase.  rediscussed with Dr. Dan Humphreys, will change to risperdal 0.5mg  in AM, 1mg  in PM, Tegretol 400 in AM, 600 in PM.  Pt will f/u this week - this has been arranged by ACT team member  Vida Roller,  MD 02/25/12 [phone]

## 2012-02-25 NOTE — ED Notes (Signed)
Pt discharged. Pt stable at time of discharge. Medications reviewed pt has no questions regarding discharge at this time. Pt voiced understanding of discharge instructions.  

## 2012-02-25 NOTE — ED Notes (Signed)
Pt has history of bipolar depression. Pt states he is feeling depressed and needs help. Denies si/hi.

## 2012-02-25 NOTE — BH Assessment (Signed)
Assessment Note   Grant Stevens is an 72 y.o. male. He arrived voluntarily, brought by his wife due to increased grandiosity and mania. Patient is seen by [facility] by Dr. Donell Beers, who recently changed his medications, reducing the resperidol from 1 mg BID to 1/2 mg BID. Patient reports he did well with that for awhile, however, wife noticed increased mania. It has gotten to the point where he is talking nearly non-stop, giving minute details which aren't always relevant to the level of the conversation. Patient is pleasant; he denies SI and HI. No hallucinations noted. He is grandiose; wife states he actually walked into a store and started acting like he was a Agricultural consultant, telling Engineer, site and the workers what a great job they were doing. Then he helped someone shovel their walk way. Both feel he does not need inpatient at this time. He contracts for safety and is appropriate. He is requesting a medication change; states he can not get in to see his psychiatrist until May 2014.  Patient sees [facility]; Dr. Donell Beers. Will contact their on-call service and arrange for an earlier appointment for patient.  Tele-psych is down in the ED; will contact [facility] about getting a psychiatrist to speak with the ED physician for a consultation on medication management for the patient; so he can be discharged with a change which will hopefully help with the mania.   Axis I: Bipolar, Manic Axis II: Deferred Axis III: see medical history Axis IV: relational and social stressor due to grandiosity and mania Axis V: GAF 51-60  Past Medical History:  Past Medical History  Diagnosis Date  . Hyponatremia     Serum sodium of 126 on 04/26/10  . ASCVD (arteriosclerotic cardiovascular disease)     CABG in 06/2001 post-IMI, EF 35%, 50% postoperatively;  . Hypertension   . Hyperlipidemia   . Tobacco abuse, in remission     15 pack years; discontinued in 1973  . Sleep apnea   . History of bipolar  disorder   . Allergic rhinitis   . Asthma   . Erectile dysfunction   . Atrial fibrillation      WARCEF    Past Surgical History  Procedure Laterality Date  . Coronary artery bypass graft  06/2001    By Dr. Tyrone Sage  . Tonsillectomy    . Circumcision  10/2003    Family History:  Family History  Problem Relation Age of Onset  . Asthma Mother   . Heart attack Father 46  . Diabetes Brother     Social History:  reports that he quit smoking about 41 years ago. He does not have any smokeless tobacco history on file. He reports that he does not drink alcohol. His drug history is not on file.  Additional Social History:     CIWA: CIWA-Ar BP: 154/86 mmHg Pulse Rate: 106 COWS:    Allergies: No Known Allergies  Home Medications:  (Not in a hospital admission)  OB/GYN Status:  No LMP for male patient.  General Assessment Data Location of Assessment: [facility] ACT Assessment: Yes Living Arrangements: Spouse/significant other Can pt return to current living arrangement?: Yes Admission Status: Voluntary Is patient capable of signing voluntary admission?: Yes Transfer from: Acute Hospital Referral Source: MD     Risk to self Suicidal Ideation: No Suicidal Intent: No Is patient at risk for suicide?: No Suicidal Plan?: No Access to Means: No What has been your use of drugs/alcohol within the last 12 months?:  denies Previous Attempts/Gestures: No Intentional Self Injurious Behavior: None Family Suicide History: No Persecutory voices/beliefs?: No Depression: No Substance abuse history and/or treatment for substance abuse?: No Suicide prevention information given to non-admitted patients: Yes  Risk to Others Homicidal Ideation: No Thoughts of Harm to Others: No Current Homicidal Intent: No Current Homicidal Plan: No Access to Homicidal Means: No History of harm to others?: No Does patient have access to weapons?: No Criminal Charges Pending?: No Does patient have a  court date: No  Psychosis Hallucinations: None noted Delusions: Grandiose  Mental Status Report Appear/Hygiene: Improved Eye Contact: Good Motor Activity: Hyperactivity Speech: Logical/coherent;Rapid;Pressured Level of Consciousness: Alert Mood: Preoccupied Affect: Euphoric Anxiety Level: Minimal Thought Processes: Coherent;Flight of Ideas Judgement: Unimpaired Orientation: Person;Place;Time;Situation Obsessive Compulsive Thoughts/Behaviors: Minimal  Cognitive Functioning Concentration: Decreased Memory: Recent Intact;Remote Intact IQ: Average Insight: Fair Impulse Control: Fair Appetite: Good Sleep: Decreased Total Hours of Sleep: 6  ADLScreening [facility] Assessment Services) Patient's cognitive ability adequate to safely complete daily activities?: Yes Patient able to express need for assistance with ADLs?: Yes Independently performs ADLs?: Yes (appropriate for developmental age)  Abuse/Neglect [facility]) Physical Abuse: Denies Verbal Abuse: Denies Sexual Abuse: Denies  Prior Inpatient Therapy Prior Inpatient Therapy: No  Prior Outpatient Therapy Prior Outpatient Therapy: Yes Prior Therapy Dates: current Prior Therapy Facilty/Provider(s): Dr. Donell Beers Reason for Treatment: bipolar d/o  ADL Screening (condition at time of admission) Patient's cognitive ability adequate to safely complete daily activities?: Yes Patient able to express need for assistance with ADLs?: Yes Independently performs ADLs?: Yes (appropriate for developmental age)       Abuse/Neglect Assessment (Assessment to be complete while patient is alone) Physical Abuse: Denies Verbal Abuse: Denies Sexual Abuse: Denies Values / Beliefs Cultural Requests During Hospitalization: None Spiritual Requests During Hospitalization: None        Additional Information 1:1 In Past 12 Months?: No CIRT Risk: No Elopement Risk: No Does patient have medical clearance?: Yes     Disposition:   Disposition Disposition of Patient: Outpatient treatment Type of outpatient treatment: Adult  On Site Evaluation by:  Dr. Hyacinth Meeker Reviewed with Physician:  Dr. Burman Blacksmith, Maximiano Coss H 02/25/2012 5:46 PM

## 2012-03-07 ENCOUNTER — Emergency Department (HOSPITAL_COMMUNITY)
Admission: EM | Admit: 2012-03-07 | Discharge: 2012-03-07 | Disposition: A | Discharge status: Home / Self Care | Payer: Medicare Other | Attending: Emergency Medicine | Admitting: Emergency Medicine

## 2012-03-07 ENCOUNTER — Encounter (HOSPITAL_COMMUNITY): Payer: Self-pay | Admitting: Emergency Medicine

## 2012-03-07 DIAGNOSIS — IMO0002 Reserved for concepts with insufficient information to code with codable children: Secondary | ICD-10-CM | POA: Diagnosis not present

## 2012-03-07 DIAGNOSIS — R609 Edema, unspecified: Secondary | ICD-10-CM | POA: Insufficient documentation

## 2012-03-07 DIAGNOSIS — J45909 Unspecified asthma, uncomplicated: Secondary | ICD-10-CM | POA: Diagnosis not present

## 2012-03-07 DIAGNOSIS — F319 Bipolar disorder, unspecified: Secondary | ICD-10-CM | POA: Diagnosis not present

## 2012-03-07 DIAGNOSIS — Z79899 Other long term (current) drug therapy: Secondary | ICD-10-CM | POA: Insufficient documentation

## 2012-03-07 DIAGNOSIS — I10 Essential (primary) hypertension: Secondary | ICD-10-CM | POA: Insufficient documentation

## 2012-03-07 DIAGNOSIS — I2510 Atherosclerotic heart disease of native coronary artery without angina pectoris: Secondary | ICD-10-CM | POA: Insufficient documentation

## 2012-03-07 DIAGNOSIS — Z7982 Long term (current) use of aspirin: Secondary | ICD-10-CM | POA: Diagnosis not present

## 2012-03-07 DIAGNOSIS — G4730 Sleep apnea, unspecified: Secondary | ICD-10-CM | POA: Insufficient documentation

## 2012-03-07 DIAGNOSIS — Z87891 Personal history of nicotine dependence: Secondary | ICD-10-CM | POA: Insufficient documentation

## 2012-03-07 DIAGNOSIS — I4891 Unspecified atrial fibrillation: Secondary | ICD-10-CM | POA: Diagnosis not present

## 2012-03-07 DIAGNOSIS — E785 Hyperlipidemia, unspecified: Secondary | ICD-10-CM | POA: Diagnosis not present

## 2012-03-07 NOTE — ED Notes (Signed)
MD at bedside. 

## 2012-03-07 NOTE — ED Provider Notes (Signed)
History     CSN: [id number]  Arrival date & time 03/07/12  0443   First MD Initiated Contact with Patient 03/07/12 [phone]      Chief Complaint  Patient presents with  . Foot Pain    HPI Patient presents emergency room with complaints of swelling in both feet left slightly more than right. He was walking yesterday more than usual. He states he walked probably a few miles. He states that his feet don't really hurt but he noticed some increased swelling. He denies any trouble with chest pain or shortness of breath. He has had trouble with swelling in his feet before and takes Lasix daily.  Past Medical History  Diagnosis Date  . Hyponatremia     Serum sodium of 126 on 04/26/10  . ASCVD (arteriosclerotic cardiovascular disease)     CABG in 06/2001 post-IMI, EF 35%, 50% postoperatively;  . Hypertension   . Hyperlipidemia   . Tobacco abuse, in remission     15 pack years; discontinued in 1973  . Sleep apnea   . History of bipolar disorder   . Allergic rhinitis   . Asthma   . Erectile dysfunction   . Atrial fibrillation      WARCEF    Past Surgical History  Procedure Laterality Date  . Coronary artery bypass graft  06/2001    By Dr. Tyrone Sage  . Tonsillectomy    . Circumcision  10/2003    Family History  Problem Relation Age of Onset  . Asthma Mother   . Heart attack Father 63  . Diabetes Brother     History  Substance Use Topics  . Smoking status: Former Smoker -- 1.00 packs/day for 15 years    Quit date: 01/11/1971  . Smokeless tobacco: Not on file  . Alcohol Use: No      Review of Systems  All other systems reviewed and are negative.    Allergies  Review of patient's allergies indicates no known allergies.  Home Medications   Current Outpatient Rx  Name  Route  Sig  Dispense  Refill  . aspirin (BAYER LOW STRENGTH) 81 MG EC tablet   Oral   Take 81 mg by mouth daily.           Marland Kitchen atorvastatin (LIPITOR) 80 MG tablet   Oral   Take 80 mg by mouth at  bedtime.           . carbamazepine (TEGRETOL) 200 MG tablet   Oral   Take 800-1,200 mg by mouth 2 (two) times daily. Takes 2 tablets in the morning and 32 tablets at night         . docusate sodium (COLACE) 100 MG capsule   Oral   Take 100 mg by mouth daily. Stool softener         . furosemide (LASIX) 20 MG tablet   Oral   Take 10 mg by mouth daily.          Marland Kitchen lisinopril (PRINIVIL,ZESTRIL) 10 MG tablet   Oral   Take 10 mg by mouth daily.           . risperiDONE (RISPERDAL) 1 MG tablet   Oral   Take 0.5-1 mg by mouth 2 (two) times daily. Takes half tablet in the morning and 1 tablet at night         . temazepam (RESTORIL) 15 MG capsule   Oral   Take 15-30 mg by mouth at bedtime as needed for sleep.         Marland Kitchen  albuterol (PROVENTIL HFA;VENTOLIN HFA) 108 (90 BASE) MCG/ACT inhaler   Inhalation   Inhale 2 puffs into the lungs every 6 (six) hours as needed for shortness of breath.            BP 122/77  Pulse 76  Temp(Src) 98.6 F (37 C) (Oral)  Resp 20  SpO2 97%  Physical Exam  Nursing note and vitals reviewed. Constitutional: He appears well-developed and well-nourished. No distress.  HENT:  Head: Normocephalic and atraumatic.  Right Ear: External ear normal.  Left Ear: External ear normal.  Eyes: Conjunctivae are normal. Right eye exhibits no discharge. Left eye exhibits no discharge. No scleral icterus.  Neck: Neck supple. No tracheal deviation present.  Cardiovascular: Normal rate, regular rhythm and intact distal pulses.   Pulmonary/Chest: Effort normal and breath sounds normal. No stridor. No respiratory distress. He has no wheezes. He has no rales.  Abdominal: Soft. Bowel sounds are normal. He exhibits no distension. There is no tenderness. There is no rebound and no guarding.  Musculoskeletal: He exhibits edema. He exhibits no tenderness.  Mild pitting edema bilateral feet, no erythema or tenderness, strong dorsalis pedis pulses bilaterally, skin is  warm without cyanosis  Neurological: He is alert. He has normal strength. No sensory deficit. Cranial nerve deficit:  no gross defecits noted. He exhibits normal muscle tone. He displays no seizure activity. Coordination normal.  Skin: Skin is warm and dry. No rash noted.  Psychiatric: He has a normal mood and affect.    ED Course  Procedures (including critical care time)  Labs Reviewed - No data to display No results found.    MDM  The patient has mild edema in his feet. This may be related to the increased walking he was doing yesterday. There is no evidence to suggest infection or acute vascular insufficiency. I doubt venous thrombosis. I will have the patient increase his Lasix to 20 mg daily for the next few days. I encouraged him to keep his feet elevated. He should return to emergency room for worsening symptoms fever       Celene Kras, MD 03/07/12 [phone]

## 2012-03-07 NOTE — ED Notes (Signed)
Per Pt, pt walked more than normal a few days ago. Pt left foot started hurting. Pt foot is swollen, pt is denying pain.

## 2012-03-08 DIAGNOSIS — Z87891 Personal history of nicotine dependence: Secondary | ICD-10-CM | POA: Insufficient documentation

## 2012-03-08 DIAGNOSIS — R609 Edema, unspecified: Secondary | ICD-10-CM | POA: Diagnosis not present

## 2012-03-08 DIAGNOSIS — I7090 Unspecified atherosclerosis: Secondary | ICD-10-CM | POA: Insufficient documentation

## 2012-03-08 DIAGNOSIS — I10 Essential (primary) hypertension: Secondary | ICD-10-CM | POA: Diagnosis not present

## 2012-03-08 DIAGNOSIS — F309 Manic episode, unspecified: Secondary | ICD-10-CM | POA: Diagnosis not present

## 2012-03-08 DIAGNOSIS — I2510 Atherosclerotic heart disease of native coronary artery without angina pectoris: Secondary | ICD-10-CM | POA: Diagnosis not present

## 2012-03-08 DIAGNOSIS — J45909 Unspecified asthma, uncomplicated: Secondary | ICD-10-CM | POA: Diagnosis not present

## 2012-03-08 DIAGNOSIS — Z87448 Personal history of other diseases of urinary system: Secondary | ICD-10-CM | POA: Insufficient documentation

## 2012-03-08 DIAGNOSIS — J309 Allergic rhinitis, unspecified: Secondary | ICD-10-CM | POA: Insufficient documentation

## 2012-03-08 DIAGNOSIS — Z7982 Long term (current) use of aspirin: Secondary | ICD-10-CM | POA: Diagnosis not present

## 2012-03-08 DIAGNOSIS — Z8679 Personal history of other diseases of the circulatory system: Secondary | ICD-10-CM | POA: Insufficient documentation

## 2012-03-08 DIAGNOSIS — E785 Hyperlipidemia, unspecified: Secondary | ICD-10-CM | POA: Insufficient documentation

## 2012-03-08 DIAGNOSIS — Z8639 Personal history of other endocrine, nutritional and metabolic disease: Secondary | ICD-10-CM | POA: Insufficient documentation

## 2012-03-08 DIAGNOSIS — G4730 Sleep apnea, unspecified: Secondary | ICD-10-CM | POA: Insufficient documentation

## 2012-03-08 DIAGNOSIS — Z79899 Other long term (current) drug therapy: Secondary | ICD-10-CM | POA: Diagnosis not present

## 2012-03-08 DIAGNOSIS — Z862 Personal history of diseases of the blood and blood-forming organs and certain disorders involving the immune mechanism: Secondary | ICD-10-CM | POA: Diagnosis not present

## 2012-03-09 ENCOUNTER — Encounter (HOSPITAL_COMMUNITY): Payer: Self-pay | Admitting: Emergency Medicine

## 2012-03-09 ENCOUNTER — Emergency Department (HOSPITAL_COMMUNITY)
Admission: EM | Admit: 2012-03-09 | Discharge: 2012-03-09 | Disposition: A | Discharge status: Home / Self Care | Payer: Medicare Other | Attending: Emergency Medicine | Admitting: Emergency Medicine

## 2012-03-09 LAB — POCT I-STAT, CHEM 8
Calcium, Ion: 1.19 mmol/L (ref 1.13–1.30)
Chloride: 100 mEq/L (ref 96–112)
Glucose, Bld: 92 mg/dL (ref 70–99)
HCT: 37 % — ABNORMAL LOW (ref 39.0–52.0)
TCO2: 26 mmol/L (ref 0–100)

## 2012-03-09 MED ORDER — FUROSEMIDE 40 MG PO TABS
20.0000 mg | ORAL_TABLET | Freq: Once | ORAL | Status: AC
  Administered 2012-03-09: 20 mg via ORAL
  Filled 2012-03-09: qty 1

## 2012-03-09 NOTE — ED Notes (Signed)
Patient complaining of swelling in bilateral feet. States he was seen at [facility] 2 days ago and "they told me to stay off my feet but I haven't done it." Also states he has a blister on his right foot that is hurting.

## 2012-03-09 NOTE — ED Notes (Signed)
Discharge instructions reviewed with pt, questions answered. Pt verbalized understanding.  

## 2012-03-09 NOTE — ED Provider Notes (Signed)
History     CSN: [id number]  Arrival date & time 03/08/12  2341   First MD Initiated Contact with Patient 03/09/12 0033      Chief Complaint  Patient presents with  . Leg Swelling    (Consider location/radiation/quality/duration/timing/severity/associated sxs/prior treatment) HPI HX per PT, ongoing leg swelling last few days. PCP has increased lasix from 10mg  to 20mg  for 3 days to help with swelling.  He has also been instructed to stay off of his feet.    Tonight he comes in with persistent swelling and unable to stay off of his feet due to issues at home.  He has been taking care of his wife who is ill, has what he believes is undiagnosed dementia.  He has had to laundry that he states he has not done in 30 years and is having a lot stress related to this.  With being on his feet, he also developed blister to his left foot. He is not diabetic. No CP or SOB.   When asked about his main reason for coming in tonight, he states it is because of his blister.  He has limited help at home, is worried about his wife and agrees to follow up with his physician regarding home health or other medical services.   Past Medical History  Diagnosis Date  . Hyponatremia     Serum sodium of 126 on 04/26/10  . ASCVD (arteriosclerotic cardiovascular disease)     CABG in 06/2001 post-IMI, EF 35%, 50% postoperatively;  . Hypertension   . Hyperlipidemia   . Tobacco abuse, in remission     15 pack years; discontinued in 1973  . Sleep apnea   . History of bipolar disorder   . Allergic rhinitis   . Asthma   . Erectile dysfunction   . Atrial fibrillation      WARCEF    Past Surgical History  Procedure Laterality Date  . Coronary artery bypass graft  06/2001    By Dr. Tyrone Sage  . Tonsillectomy    . Circumcision  10/2003    Family History  Problem Relation Age of Onset  . Asthma Mother   . Heart attack Father 12  . Diabetes Brother     History  Substance Use Topics  . Smoking status:  Former Smoker -- 1.00 packs/day for 15 years    Quit date: 01/11/1971  . Smokeless tobacco: Not on file  . Alcohol Use: No      Review of Systems  Constitutional: Negative for fever and chills.  HENT: Negative for neck pain and neck stiffness.   Eyes: Negative for pain.  Respiratory: Negative for shortness of breath.   Cardiovascular: Positive for leg swelling. Negative for chest pain.  Gastrointestinal: Negative for abdominal pain.  Genitourinary: Negative for dysuria.  Musculoskeletal: Negative for back pain.  Skin: Negative for rash.  Neurological: Negative for headaches.  All other systems reviewed and are negative.    Allergies  Review of patient's allergies indicates no known allergies.  Home Medications   Current Outpatient Rx  Name  Route  Sig  Dispense  Refill  . albuterol (PROVENTIL HFA;VENTOLIN HFA) 108 (90 BASE) MCG/ACT inhaler   Inhalation   Inhale 2 puffs into the lungs every 6 (six) hours as needed for shortness of breath.          Marland Kitchen aspirin (BAYER LOW STRENGTH) 81 MG EC tablet   Oral   Take 81 mg by mouth daily.           Marland Kitchen  atorvastatin (LIPITOR) 80 MG tablet   Oral   Take 80 mg by mouth at bedtime.           . carbamazepine (TEGRETOL) 200 MG tablet   Oral   Take 800-1,200 mg by mouth 2 (two) times daily. Takes 2 tablets in the morning and 32 tablets at night         . docusate sodium (COLACE) 100 MG capsule   Oral   Take 100 mg by mouth daily. Stool softener         . furosemide (LASIX) 20 MG tablet   Oral   Take 10 mg by mouth daily.          Marland Kitchen lisinopril (PRINIVIL,ZESTRIL) 10 MG tablet   Oral   Take 10 mg by mouth daily.           . risperiDONE (RISPERDAL) 1 MG tablet   Oral   Take 0.5-1 mg by mouth 2 (two) times daily. Takes half tablet in the morning and 1 tablet at night         . temazepam (RESTORIL) 15 MG capsule   Oral   Take 15-30 mg by mouth at bedtime as needed for sleep.           BP 143/76  Pulse 83   Temp(Src) 98.1 F (36.7 C) (Oral)  Resp 18  Ht 5\' 11"  (1.803 m)  Wt 239 lb (108.41 kg)  BMI 33.35 kg/m2  SpO2 95%  Physical Exam  Constitutional: He is oriented to person, place, and time. He appears well-developed and well-nourished.  HENT:  Head: Normocephalic and atraumatic.  Eyes: EOM are normal. Pupils are equal, round, and reactive to light.  Neck: Neck supple. No JVD present.  Cardiovascular: Normal rate, regular rhythm and intact distal pulses.   Pulmonary/Chest: Effort normal and breath sounds normal. No respiratory distress. He has no rales. He exhibits no tenderness.  Abdominal: Soft. Bowel sounds are normal. He exhibits no distension.  Musculoskeletal: Normal range of motion.  Blister to plantar aspect of R foot medially. No ulcer. No deformity otherwise  1 plus bilateral LE peripheral edema.    Neurological: He is alert and oriented to person, place, and time.  Skin: Skin is warm and dry.    ED Course  Procedures (including critical care time)  Results for orders placed during the hospital encounter of 03/09/12  POCT I-STAT, CHEM 8      Result Value Range   Sodium 134 (*) 135 - 145 mEq/L   Potassium 4.2  3.5 - 5.1 mEq/L   Chloride 100  96 - 112 mEq/L   BUN 18  6 - 23 mg/dL   Creatinine, Ser 4.09  0.50 - 1.35 mg/dL   Glucose, Bld 92  70 - 99 mg/dL   Calcium, Ion 8.11  9.14 - 1.30 mmol/L   TCO2 26  0 - 100 mmol/L   Hemoglobin 12.6 (*) 13.0 - 17.0 g/dL   HCT 78.2 (*) 95.6 - 21.3 %   Lasix and wound care provided  Dressing supplies given to PT for bandage changes at home   Plan f/u PCP  2:44 AM PT requesting to be discharged home - agrees to close follow up MDM  Blister to R foot and persistent LE swelling. No Cp or SOB or indication for admit at this time  labs obtained/ reviewed crt WNL  VS and nursing notes and old records reviewed      Sunnie Nielsen, MD 03/09/12 [phone]

## 2012-03-10 ENCOUNTER — Other Ambulatory Visit: Payer: Self-pay | Admitting: Internal Medicine

## 2012-04-05 ENCOUNTER — Ambulatory Visit (INDEPENDENT_AMBULATORY_CARE_PROVIDER_SITE_OTHER): Payer: Medicare Other | Admitting: Otolaryngology

## 2012-04-05 DIAGNOSIS — H903 Sensorineural hearing loss, bilateral: Secondary | ICD-10-CM

## 2012-04-05 DIAGNOSIS — H6120 Impacted cerumen, unspecified ear: Secondary | ICD-10-CM | POA: Diagnosis not present

## 2012-04-05 DIAGNOSIS — N529 Male erectile dysfunction, unspecified: Secondary | ICD-10-CM | POA: Diagnosis not present

## 2012-04-09 ENCOUNTER — Emergency Department (HOSPITAL_COMMUNITY): Payer: Medicare Other

## 2012-04-09 ENCOUNTER — Emergency Department (HOSPITAL_COMMUNITY)
Admission: EM | Admit: 2012-04-09 | Discharge: 2012-04-09 | Disposition: A | Discharge status: Home / Self Care | Payer: Medicare Other | Attending: Emergency Medicine | Admitting: Emergency Medicine

## 2012-04-09 ENCOUNTER — Encounter (HOSPITAL_COMMUNITY): Payer: Self-pay | Admitting: *Deleted

## 2012-04-09 DIAGNOSIS — F319 Bipolar disorder, unspecified: Secondary | ICD-10-CM | POA: Diagnosis not present

## 2012-04-09 DIAGNOSIS — I252 Old myocardial infarction: Secondary | ICD-10-CM | POA: Diagnosis not present

## 2012-04-09 DIAGNOSIS — I2510 Atherosclerotic heart disease of native coronary artery without angina pectoris: Secondary | ICD-10-CM | POA: Diagnosis not present

## 2012-04-09 DIAGNOSIS — M25469 Effusion, unspecified knee: Secondary | ICD-10-CM | POA: Diagnosis not present

## 2012-04-09 DIAGNOSIS — Z8679 Personal history of other diseases of the circulatory system: Secondary | ICD-10-CM | POA: Insufficient documentation

## 2012-04-09 DIAGNOSIS — I10 Essential (primary) hypertension: Secondary | ICD-10-CM | POA: Diagnosis not present

## 2012-04-09 DIAGNOSIS — Z7982 Long term (current) use of aspirin: Secondary | ICD-10-CM | POA: Diagnosis not present

## 2012-04-09 DIAGNOSIS — Z8709 Personal history of other diseases of the respiratory system: Secondary | ICD-10-CM | POA: Diagnosis not present

## 2012-04-09 DIAGNOSIS — Z862 Personal history of diseases of the blood and blood-forming organs and certain disorders involving the immune mechanism: Secondary | ICD-10-CM | POA: Diagnosis not present

## 2012-04-09 DIAGNOSIS — Z87448 Personal history of other diseases of urinary system: Secondary | ICD-10-CM | POA: Insufficient documentation

## 2012-04-09 DIAGNOSIS — J45909 Unspecified asthma, uncomplicated: Secondary | ICD-10-CM | POA: Diagnosis not present

## 2012-04-09 DIAGNOSIS — M1710 Unilateral primary osteoarthritis, unspecified knee: Secondary | ICD-10-CM | POA: Diagnosis not present

## 2012-04-09 DIAGNOSIS — M25561 Pain in right knee: Secondary | ICD-10-CM

## 2012-04-09 DIAGNOSIS — M25569 Pain in unspecified knee: Secondary | ICD-10-CM | POA: Diagnosis not present

## 2012-04-09 DIAGNOSIS — Z79899 Other long term (current) drug therapy: Secondary | ICD-10-CM | POA: Diagnosis not present

## 2012-04-09 DIAGNOSIS — E785 Hyperlipidemia, unspecified: Secondary | ICD-10-CM | POA: Insufficient documentation

## 2012-04-09 DIAGNOSIS — Z8669 Personal history of other diseases of the nervous system and sense organs: Secondary | ICD-10-CM | POA: Insufficient documentation

## 2012-04-09 DIAGNOSIS — Z8639 Personal history of other endocrine, nutritional and metabolic disease: Secondary | ICD-10-CM | POA: Insufficient documentation

## 2012-04-09 DIAGNOSIS — Z87891 Personal history of nicotine dependence: Secondary | ICD-10-CM | POA: Diagnosis not present

## 2012-04-09 DIAGNOSIS — Z951 Presence of aortocoronary bypass graft: Secondary | ICD-10-CM | POA: Insufficient documentation

## 2012-04-09 HISTORY — DX: Acute myocardial infarction, unspecified: I21.9

## 2012-04-09 MED ORDER — HYDROCODONE-ACETAMINOPHEN 5-325 MG PO TABS: ORAL_TABLET | ORAL | Status: DC

## 2012-04-09 NOTE — ED Notes (Signed)
Rt knee pain x 6 weeks. No known injury

## 2012-04-09 NOTE — ED Provider Notes (Signed)
History     CSN: [id number]  Arrival date & time 04/09/12  2219   First MD Initiated Contact with Patient 04/09/12 2243      Chief Complaint  Patient presents with  . Knee Pain    (Consider location/radiation/quality/duration/timing/severity/associated sxs/prior treatment) HPI Comments: Patient c/o increased right knee pain today.  States he has re-current pain to the right knee for several weeks.  He denies trauma, but states the pain worsened today after working out in the yard.  He denies redness, swelling, weakness of the leg, or recent illness.    Patient is a 72 y.o. male presenting with knee pain. The history is provided by the patient.  Knee Pain Location:  Knee Time since incident:  6 weeks Injury: no   Knee location:  R knee Pain details:    Quality:  Aching   Radiates to:  Does not radiate   Severity:  Moderate   Onset quality:  Gradual   Timing:  Intermittent   Progression:  Unchanged Chronicity:  Recurrent Dislocation: no   Foreign body present:  No foreign bodies Prior injury to area:  Yes Relieved by:  Rest Worsened by:  Bearing weight and activity Ineffective treatments:  None tried Associated symptoms: no back pain, no decreased ROM, no fatigue, no fever, no muscle weakness, no neck pain, no numbness, no stiffness, no swelling and no tingling     Past Medical History  Diagnosis Date  . Hyponatremia     Serum sodium of 126 on 04/26/10  . ASCVD (arteriosclerotic cardiovascular disease)     CABG in 06/2001 post-IMI, EF 35%, 50% postoperatively;  . Hypertension   . Hyperlipidemia   . Tobacco abuse, in remission     15 pack years; discontinued in 1973  . Sleep apnea   . History of bipolar disorder   . Allergic rhinitis   . Asthma   . Erectile dysfunction   . Atrial fibrillation      WARCEF  . Myocardial infarction     Past Surgical History  Procedure Laterality Date  . Coronary artery bypass graft  06/2001    By Dr. Tyrone Sage  . Tonsillectomy     . Circumcision  10/2003    Family History  Problem Relation Age of Onset  . Asthma Mother   . Heart attack Father 89  . Diabetes Brother     History  Substance Use Topics  . Smoking status: Former Smoker -- 1.00 packs/day for 15 years    Quit date: 01/11/1971  . Smokeless tobacco: Not on file  . Alcohol Use: No      Review of Systems  Constitutional: Negative for fever, chills and fatigue.  HENT: Negative for neck pain.   Genitourinary: Negative for dysuria and difficulty urinating.  Musculoskeletal: Positive for joint swelling and arthralgias. Negative for back pain and stiffness.  Skin: Negative for color change and wound.  All other systems reviewed and are negative.    Allergies  Review of patient's allergies indicates no known allergies.  Home Medications   Current Outpatient Rx  Name  Route  Sig  Dispense  Refill  . albuterol (PROVENTIL HFA;VENTOLIN HFA) 108 (90 BASE) MCG/ACT inhaler   Inhalation   Inhale 2 puffs into the lungs every 6 (six) hours as needed for shortness of breath.          Marland Kitchen aspirin (BAYER LOW STRENGTH) 81 MG EC tablet   Oral   Take 81 mg by mouth daily.           Marland Kitchen  atorvastatin (LIPITOR) 80 MG tablet   Oral   Take 80 mg by mouth at bedtime.           . carbamazepine (TEGRETOL) 200 MG tablet   Oral   Take 800-1,200 mg by mouth 2 (two) times daily. Takes 2 tablets in the morning and 32 tablets at night         . docusate sodium (COLACE) 100 MG capsule   Oral   Take 100 mg by mouth daily. Stool softener         . furosemide (LASIX) 20 MG tablet   Oral   Take 10 mg by mouth daily.          Marland Kitchen lisinopril (PRINIVIL,ZESTRIL) 10 MG tablet   Oral   Take 10 mg by mouth daily.           . risperiDONE (RISPERDAL) 1 MG tablet   Oral   Take 0.5-1 mg by mouth 2 (two) times daily. Takes half tablet in the morning and 1 tablet at night         . temazepam (RESTORIL) 15 MG capsule   Oral   Take 15-30 mg by mouth at bedtime  as needed for sleep.           BP 145/74  Pulse 81  Temp(Src) 97.6 F (36.4 C) (Oral)  Resp 16  Ht 5\' 11"  (1.803 m)  Wt 240 lb (108.863 kg)  BMI 33.49 kg/m2  SpO2 98%  Physical Exam  Nursing note and vitals reviewed. Constitutional: He is oriented to person, place, and time. He appears well-developed and well-nourished. No distress.  Cardiovascular: Normal rate, regular rhythm, normal heart sounds and intact distal pulses.   Pulmonary/Chest: Effort normal and breath sounds normal. No respiratory distress.  Musculoskeletal: He exhibits tenderness. He exhibits no edema.  ttp of the anterior right knee.  Patella crepitus.  No erythema, bruising or step-off deformity.  Distal sensation intact.  DP pulses are brisk and symmetrical.  No calf pain or edema. Pt has full ROM of the knee but pain reproduced with flexion    Neurological: He is alert and oriented to person, place, and time. He exhibits normal muscle tone. Coordination normal.  Skin: Skin is warm and dry. No erythema.    ED Course  Procedures (including critical care time)  Labs Reviewed - No data to display Dg Knee Complete 4 Views Right  04/09/2012  *RADIOLOGY REPORT*  Clinical Data: Knee pain  RIGHT KNEE - COMPLETE 4+ VIEW  Comparison: None.  Findings: Moderate degenerative change with narrowing of the medial compartment.  No acute fracture and no dislocation.  Postoperative changes.  IMPRESSION: No acute bony pathology.   Original Report Authenticated By: Jolaine Click, M.D.      Knee immobilizer applied for comfort, pain improved, remains NV intact  MDM    ttp of the right knee.  No erythema, effusion or step-off deformity.  Moderate crepitus on exam.    Nursing notes and x-ray results were reviewed by me and considered.  Doubt septic joint.  Pt agree to close f/u with orthopedics.  I will dispense vicodin pre-pack and prescribe #12.  The patient appears reasonably screened and/or stabilized for discharge and I  doubt any other medical condition or other [facility] requiring further screening, evaluation, or treatment in the ED at this time prior to discharge.       Abella Shugart L. Trisha Mangle, PA-C 04/12/12 1335

## 2012-04-12 DIAGNOSIS — Z79899 Other long term (current) drug therapy: Secondary | ICD-10-CM | POA: Diagnosis not present

## 2012-04-12 NOTE — ED Provider Notes (Signed)
Medical screening examination/treatment/procedure(s) were performed by non-physician practitioner and as supervising physician I was immediately available for consultation/collaboration.   Jalesia Loudenslager M Kadasia Kassing, DO 04/12/12 2038 

## 2012-04-13 DIAGNOSIS — F3110 Bipolar disorder, current episode manic without psychotic features, unspecified: Secondary | ICD-10-CM | POA: Diagnosis not present

## 2012-04-16 DIAGNOSIS — Z79899 Other long term (current) drug therapy: Secondary | ICD-10-CM | POA: Diagnosis not present

## 2012-04-23 DIAGNOSIS — I509 Heart failure, unspecified: Secondary | ICD-10-CM | POA: Diagnosis not present

## 2012-04-23 DIAGNOSIS — S1096XA Insect bite of unspecified part of neck, initial encounter: Secondary | ICD-10-CM | POA: Diagnosis not present

## 2012-04-25 ENCOUNTER — Encounter (HOSPITAL_COMMUNITY): Payer: Self-pay | Admitting: Emergency Medicine

## 2012-04-25 ENCOUNTER — Emergency Department (HOSPITAL_COMMUNITY)
Admission: EM | Admit: 2012-04-25 | Discharge: 2012-04-25 | Disposition: A | Discharge status: Home / Self Care | Payer: Medicare Other | Attending: Emergency Medicine | Admitting: Emergency Medicine

## 2012-04-25 DIAGNOSIS — Z862 Personal history of diseases of the blood and blood-forming organs and certain disorders involving the immune mechanism: Secondary | ICD-10-CM | POA: Insufficient documentation

## 2012-04-25 DIAGNOSIS — Z8709 Personal history of other diseases of the respiratory system: Secondary | ICD-10-CM | POA: Diagnosis not present

## 2012-04-25 DIAGNOSIS — Z8679 Personal history of other diseases of the circulatory system: Secondary | ICD-10-CM | POA: Diagnosis not present

## 2012-04-25 DIAGNOSIS — Z7982 Long term (current) use of aspirin: Secondary | ICD-10-CM | POA: Insufficient documentation

## 2012-04-25 DIAGNOSIS — W57XXXA Bitten or stung by nonvenomous insect and other nonvenomous arthropods, initial encounter: Secondary | ICD-10-CM | POA: Insufficient documentation

## 2012-04-25 DIAGNOSIS — Z79899 Other long term (current) drug therapy: Secondary | ICD-10-CM | POA: Diagnosis not present

## 2012-04-25 DIAGNOSIS — Z8639 Personal history of other endocrine, nutritional and metabolic disease: Secondary | ICD-10-CM | POA: Insufficient documentation

## 2012-04-25 DIAGNOSIS — I10 Essential (primary) hypertension: Secondary | ICD-10-CM | POA: Insufficient documentation

## 2012-04-25 DIAGNOSIS — I252 Old myocardial infarction: Secondary | ICD-10-CM | POA: Insufficient documentation

## 2012-04-25 DIAGNOSIS — Z8669 Personal history of other diseases of the nervous system and sense organs: Secondary | ICD-10-CM | POA: Diagnosis not present

## 2012-04-25 DIAGNOSIS — S30863A Insect bite (nonvenomous) of scrotum and testes, initial encounter: Secondary | ICD-10-CM

## 2012-04-25 DIAGNOSIS — S30860A Insect bite (nonvenomous) of lower back and pelvis, initial encounter: Secondary | ICD-10-CM | POA: Insufficient documentation

## 2012-04-25 DIAGNOSIS — Z87448 Personal history of other diseases of urinary system: Secondary | ICD-10-CM | POA: Insufficient documentation

## 2012-04-25 DIAGNOSIS — Z8659 Personal history of other mental and behavioral disorders: Secondary | ICD-10-CM | POA: Diagnosis not present

## 2012-04-25 DIAGNOSIS — Z87891 Personal history of nicotine dependence: Secondary | ICD-10-CM | POA: Insufficient documentation

## 2012-04-25 DIAGNOSIS — J45909 Unspecified asthma, uncomplicated: Secondary | ICD-10-CM | POA: Insufficient documentation

## 2012-04-25 DIAGNOSIS — Z951 Presence of aortocoronary bypass graft: Secondary | ICD-10-CM | POA: Diagnosis not present

## 2012-04-25 DIAGNOSIS — Y9289 Other specified places as the place of occurrence of the external cause: Secondary | ICD-10-CM | POA: Insufficient documentation

## 2012-04-25 DIAGNOSIS — E785 Hyperlipidemia, unspecified: Secondary | ICD-10-CM | POA: Insufficient documentation

## 2012-04-25 DIAGNOSIS — Y9301 Activity, walking, marching and hiking: Secondary | ICD-10-CM | POA: Insufficient documentation

## 2012-04-25 NOTE — ED Notes (Signed)
Pt states he has tick on the backs of his legs and his private area. Pt states he went to his pcp for the same, but the doctor did not remove all them.

## 2012-04-25 NOTE — ED Notes (Signed)
Pt seen today by PCP to have multiple ticks removed. Pt found another tick on his scrotum. Tick removed by PA. Pt inspected for additional ticks & none found.

## 2012-04-25 NOTE — ED Notes (Signed)
Pt left w/o getting discharge papers. Pt left department before d/c papers were ready.

## 2012-04-25 NOTE — ED Notes (Addendum)
Pt left w/o discharge papers, pt did not say anything before leaving. Unable to have pt sign.

## 2012-04-26 DIAGNOSIS — D485 Neoplasm of uncertain behavior of skin: Secondary | ICD-10-CM | POA: Diagnosis not present

## 2012-04-26 DIAGNOSIS — D235 Other benign neoplasm of skin of trunk: Secondary | ICD-10-CM | POA: Diagnosis not present

## 2012-04-26 DIAGNOSIS — B079 Viral wart, unspecified: Secondary | ICD-10-CM | POA: Diagnosis not present

## 2012-04-26 NOTE — ED Provider Notes (Signed)
History     CSN: [id number]  Arrival date & time 04/25/12  2216   First MD Initiated Contact with Patient 04/25/12 2223      Chief Complaint  Patient presents with  . Tick Removal    (Consider location/radiation/quality/duration/timing/severity/associated sxs/prior treatment) HPI Comments: Grant Stevens is a 72 yo male that presents to the ED with a complaint of tick bite to his scrotum.  He states that he went walking on a nature trail 3-4 days ago and noticed several ticks on his body.  He was seen by his PMD two days ago and had most of the ticks removed, but tonight he noticed a tick on his scrotum and posterior left leg.  He denies any symptoms including fever, rash, itching, or joint pain   The history is provided by the patient.    Past Medical History  Diagnosis Date  . Hyponatremia     Serum sodium of 126 on 04/26/10  . ASCVD (arteriosclerotic cardiovascular disease)     CABG in 06/2001 post-IMI, EF 35%, 50% postoperatively;  . Hypertension   . Hyperlipidemia   . Tobacco abuse, in remission     15 pack years; discontinued in 1973  . Sleep apnea   . History of bipolar disorder   . Allergic rhinitis   . Asthma   . Erectile dysfunction   . Atrial fibrillation      WARCEF  . Myocardial infarction     Past Surgical History  Procedure Laterality Date  . Coronary artery bypass graft  06/2001    By Dr. Tyrone Sage  . Tonsillectomy    . Circumcision  10/2003    Family History  Problem Relation Age of Onset  . Asthma Mother   . Heart attack Father 64  . Diabetes Brother     History  Substance Use Topics  . Smoking status: Former Smoker -- 1.00 packs/day for 15 years    Quit date: 01/11/1971  . Smokeless tobacco: Not on file  . Alcohol Use: No      Review of Systems  Constitutional: Negative for fever, chills, activity change and appetite change.  Gastrointestinal: Negative for nausea and vomiting.  Genitourinary: Negative for hematuria, flank pain,  decreased urine volume, penile swelling, scrotal swelling, difficulty urinating and penile pain.  Musculoskeletal: Negative for myalgias, joint swelling, arthralgias and gait problem.  Skin: Negative for rash.  Neurological: Negative for dizziness, weakness and numbness.  All other systems reviewed and are negative.    Allergies  Review of patient's allergies indicates no known allergies.  Home Medications   Current Outpatient Rx  Name  Route  Sig  Dispense  Refill  . albuterol (PROVENTIL HFA;VENTOLIN HFA) 108 (90 BASE) MCG/ACT inhaler   Inhalation   Inhale 2 puffs into the lungs every 6 (six) hours as needed for shortness of breath.          Marland Kitchen aspirin (BAYER LOW STRENGTH) 81 MG EC tablet   Oral   Take 81 mg by mouth daily.           Marland Kitchen atorvastatin (LIPITOR) 80 MG tablet   Oral   Take 80 mg by mouth at bedtime.           . carbamazepine (TEGRETOL) 200 MG tablet   Oral   Take 800-1,200 mg by mouth 2 (two) times daily. Takes 2 tablets in the morning and 32 tablets at night         . docusate sodium (COLACE) 100 MG  capsule   Oral   Take 100 mg by mouth daily. Stool softener         . HYDROcodone-acetaminophen (NORCO/VICODIN) 5-325 MG per tablet      Take one tab po q 4-6 hrs prn pain   12 tablet   0   . HYDROcodone-acetaminophen (NORCO/VICODIN) 5-325 MG per tablet      Take one tab po q 4-6 hrs prn pain   6 tablet   0     Disp as pre-pack   . lisinopril (PRINIVIL,ZESTRIL) 10 MG tablet   Oral   Take 10 mg by mouth daily.           . risperiDONE (RISPERDAL) 1 MG tablet   Oral   Take 0.5-1 mg by mouth 2 (two) times daily. Takes half tablet in the morning and 1 tablet at night         . temazepam (RESTORIL) 15 MG capsule   Oral   Take 15-30 mg by mouth at bedtime as needed for sleep.           BP 178/86  Pulse 92  Temp(Src) 97.5 F (36.4 C) (Oral)  Resp 16  Ht 5\' 11"  (1.803 m)  Wt 237 lb (107.502 kg)  BMI 33.07 kg/m2  SpO2  99%  Physical Exam  Nursing note and vitals reviewed. Constitutional: He is oriented to person, place, and time. He appears well-developed and well-nourished. No distress.  HENT:  Head: Normocephalic and atraumatic.  Mouth/Throat: Oropharynx is clear and moist.  Neck: Neck supple.  Cardiovascular: Normal rate, regular rhythm, normal heart sounds and intact distal pulses.   No murmur heard. Pulmonary/Chest: Effort normal and breath sounds normal. No respiratory distress. He exhibits no tenderness.  Abdominal: Soft. He exhibits no distension. There is no tenderness.  Musculoskeletal: Normal range of motion. He exhibits no tenderness.  Lymphadenopathy:    He has no cervical adenopathy.  Neurological: He is alert and oriented to person, place, and time. He exhibits normal muscle tone. Coordination normal.  Skin: Skin is warm and dry.  Small tick attached to the skin of the scrotum    ED Course  Procedures (including critical care time)  Labs Reviewed - No data to display No results found.   1. Tick bite of scrotum, initial encounter       MDM   Scrotum was cleaned with alcohol and tick removed by me completely using forceps. (chaparone present) Area was examined for remaining tick parts and none were visualized.  Patient was examined by myself and nurse without other ticks seen.    I have advised pt to f/u with his PMD for any symptoms of fever, joint pain, chills or rash.  He verbalized understanding and agreed to care plan.       Kathyrn Warmuth L. Omarian Jaquith, PA-C 04/26/12 0031

## 2012-04-27 NOTE — ED Provider Notes (Signed)
Medical screening examination/treatment/procedure(s) were performed by non-physician practitioner and as supervising physician I was immediately available for consultation/collaboration.   Neville Pauls L Dayton Sherr, MD 04/27/12 1242 

## 2012-05-01 ENCOUNTER — Encounter (INDEPENDENT_AMBULATORY_CARE_PROVIDER_SITE_OTHER): Payer: Self-pay | Admitting: *Deleted

## 2012-05-09 ENCOUNTER — Other Ambulatory Visit (INDEPENDENT_AMBULATORY_CARE_PROVIDER_SITE_OTHER): Payer: Self-pay | Admitting: *Deleted

## 2012-05-09 ENCOUNTER — Encounter (INDEPENDENT_AMBULATORY_CARE_PROVIDER_SITE_OTHER): Payer: Self-pay | Admitting: *Deleted

## 2012-05-09 ENCOUNTER — Telehealth (INDEPENDENT_AMBULATORY_CARE_PROVIDER_SITE_OTHER): Payer: Self-pay | Admitting: *Deleted

## 2012-05-09 DIAGNOSIS — Z1211 Encounter for screening for malignant neoplasm of colon: Secondary | ICD-10-CM

## 2012-05-09 MED ORDER — PEG-KCL-NACL-NASULF-NA ASC-C 100 G PO SOLR: 1.0000 | Freq: Once | ORAL | Status: DC

## 2012-05-09 NOTE — Telephone Encounter (Signed)
Patient needs movi prep 

## 2012-05-10 DIAGNOSIS — F3110 Bipolar disorder, current episode manic without psychotic features, unspecified: Secondary | ICD-10-CM | POA: Diagnosis not present

## 2012-05-13 ENCOUNTER — Encounter (HOSPITAL_COMMUNITY): Payer: Self-pay | Admitting: Emergency Medicine

## 2012-05-13 ENCOUNTER — Emergency Department (HOSPITAL_COMMUNITY)
Admission: EM | Admit: 2012-05-13 | Discharge: 2012-05-13 | Disposition: A | Discharge status: Home / Self Care | Payer: Medicare Other | Attending: Emergency Medicine | Admitting: Emergency Medicine

## 2012-05-13 DIAGNOSIS — Z8639 Personal history of other endocrine, nutritional and metabolic disease: Secondary | ICD-10-CM | POA: Insufficient documentation

## 2012-05-13 DIAGNOSIS — E785 Hyperlipidemia, unspecified: Secondary | ICD-10-CM | POA: Diagnosis not present

## 2012-05-13 DIAGNOSIS — Z4801 Encounter for change or removal of surgical wound dressing: Secondary | ICD-10-CM | POA: Insufficient documentation

## 2012-05-13 DIAGNOSIS — Z7982 Long term (current) use of aspirin: Secondary | ICD-10-CM | POA: Diagnosis not present

## 2012-05-13 DIAGNOSIS — I252 Old myocardial infarction: Secondary | ICD-10-CM | POA: Diagnosis not present

## 2012-05-13 DIAGNOSIS — Z8659 Personal history of other mental and behavioral disorders: Secondary | ICD-10-CM | POA: Insufficient documentation

## 2012-05-13 DIAGNOSIS — Z8669 Personal history of other diseases of the nervous system and sense organs: Secondary | ICD-10-CM | POA: Diagnosis not present

## 2012-05-13 DIAGNOSIS — I4891 Unspecified atrial fibrillation: Secondary | ICD-10-CM | POA: Diagnosis not present

## 2012-05-13 DIAGNOSIS — I10 Essential (primary) hypertension: Secondary | ICD-10-CM | POA: Insufficient documentation

## 2012-05-13 DIAGNOSIS — Z79899 Other long term (current) drug therapy: Secondary | ICD-10-CM | POA: Diagnosis not present

## 2012-05-13 DIAGNOSIS — M79609 Pain in unspecified limb: Secondary | ICD-10-CM | POA: Diagnosis not present

## 2012-05-13 DIAGNOSIS — Z87891 Personal history of nicotine dependence: Secondary | ICD-10-CM | POA: Diagnosis not present

## 2012-05-13 DIAGNOSIS — Z87448 Personal history of other diseases of urinary system: Secondary | ICD-10-CM | POA: Insufficient documentation

## 2012-05-13 DIAGNOSIS — Z951 Presence of aortocoronary bypass graft: Secondary | ICD-10-CM | POA: Diagnosis not present

## 2012-05-13 DIAGNOSIS — Z862 Personal history of diseases of the blood and blood-forming organs and certain disorders involving the immune mechanism: Secondary | ICD-10-CM | POA: Diagnosis not present

## 2012-05-13 DIAGNOSIS — S91109A Unspecified open wound of unspecified toe(s) without damage to nail, initial encounter: Secondary | ICD-10-CM

## 2012-05-13 DIAGNOSIS — J45909 Unspecified asthma, uncomplicated: Secondary | ICD-10-CM | POA: Diagnosis not present

## 2012-05-13 DIAGNOSIS — G4730 Sleep apnea, unspecified: Secondary | ICD-10-CM | POA: Insufficient documentation

## 2012-05-13 NOTE — ED Notes (Addendum)
Patient complaining of foot pain to right foot. Pitting edema to lower extremities bilaterally, worse on left. Reports had spot cut from middle toe to right foot recently.

## 2012-05-13 NOTE — ED Provider Notes (Signed)
History     CSN: [id number]  Arrival date & time 05/13/12  0244   First MD Initiated Contact with Patient 05/13/12 [phone]      Chief Complaint  Patient presents with  . Foot Swelling    (Consider location/radiation/quality/duration/timing/severity/associated sxs/prior treatment) HPI Grant Stevens is a 72 y.o. male who presents to the Emergency Department complaining of wound to his right third toe that he wants checked. He had a mole removed from the toe two weeks ago and has kept the area covered. Today he noticed it was a little red and was worried about infection.  PCP Dr. Ouida Sills  Past Medical History  Diagnosis Date  . Hyponatremia     Serum sodium of 126 on 04/26/10  . ASCVD (arteriosclerotic cardiovascular disease)     CABG in 06/2001 post-IMI, EF 35%, 50% postoperatively;  . Hypertension   . Hyperlipidemia   . Tobacco abuse, in remission     15 pack years; discontinued in 1973  . Sleep apnea   . History of bipolar disorder   . Allergic rhinitis   . Asthma   . Erectile dysfunction   . Atrial fibrillation      WARCEF  . Myocardial infarction     Past Surgical History  Procedure Laterality Date  . Coronary artery bypass graft  06/2001    By Dr. Tyrone Sage  . Tonsillectomy    . Circumcision  10/2003    Family History  Problem Relation Age of Onset  . Asthma Mother   . Heart attack Father 61  . Diabetes Brother     History  Substance Use Topics  . Smoking status: Former Smoker -- 1.00 packs/day for 15 years    Quit date: 01/11/1971  . Smokeless tobacco: Not on file  . Alcohol Use: No      Review of Systems  Constitutional: Negative for fever.       10 Systems reviewed and are negative for acute change except as noted in the HPI.  HENT: Negative for congestion.   Eyes: Negative for discharge and redness.  Respiratory: Negative for cough and shortness of breath.   Cardiovascular: Negative for chest pain.  Gastrointestinal: Negative for vomiting and  abdominal pain.  Musculoskeletal: Negative for back pain.  Skin: Negative for rash.       Toe wound  Neurological: Negative for syncope, numbness and headaches.  Psychiatric/Behavioral:       No behavior change.    Allergies  Review of patient's allergies indicates no known allergies.  Home Medications   Current Outpatient Rx  Name  Route  Sig  Dispense  Refill  . albuterol (PROVENTIL HFA;VENTOLIN HFA) 108 (90 BASE) MCG/ACT inhaler   Inhalation   Inhale 2 puffs into the lungs every 6 (six) hours as needed for shortness of breath.          Marland Kitchen aspirin (BAYER LOW STRENGTH) 81 MG EC tablet   Oral   Take 81 mg by mouth daily.           Marland Kitchen atorvastatin (LIPITOR) 80 MG tablet   Oral   Take 80 mg by mouth at bedtime.           . carbamazepine (TEGRETOL) 200 MG tablet   Oral   Take 800-1,200 mg by mouth 2 (two) times daily. Takes 2 tablets in the morning and 32 tablets at night         . docusate sodium (COLACE) 100 MG capsule   Oral  Take 100 mg by mouth daily. Stool softener         . HYDROcodone-acetaminophen (NORCO/VICODIN) 5-325 MG per tablet      Take one tab po q 4-6 hrs prn pain   12 tablet   0   . HYDROcodone-acetaminophen (NORCO/VICODIN) 5-325 MG per tablet      Take one tab po q 4-6 hrs prn pain   6 tablet   0     Disp as pre-pack   . lisinopril (PRINIVIL,ZESTRIL) 10 MG tablet   Oral   Take 10 mg by mouth daily.           . peg 3350 powder (MOVIPREP) 100 G SOLR   Oral   Take 1 kit (100 g total) by mouth once.   1 kit   0   . risperiDONE (RISPERDAL) 1 MG tablet   Oral   Take 0.5-1 mg by mouth 2 (two) times daily. Takes half tablet in the morning and 1 tablet at night         . temazepam (RESTORIL) 15 MG capsule   Oral   Take 15-30 mg by mouth at bedtime as needed for sleep.           BP 137/60  Pulse 84  Temp(Src) 97.9 F (36.6 C) (Oral)  Resp 18  Ht 5\' 11"  (1.803 m)  Wt 237 lb (107.502 kg)  BMI 33.07 kg/m2  SpO2  96%  Physical Exam  Nursing note and vitals reviewed. Constitutional: He appears well-developed and well-nourished.  Awake, alert, nontoxic appearance.  HENT:  Head: Normocephalic and atraumatic.  Right Ear: External ear normal.  Left Ear: External ear normal.  Eyes: EOM are normal. Pupils are equal, round, and reactive to light.  Neck: Neck supple.  Cardiovascular: Normal rate and intact distal pulses.   Pulmonary/Chest: Effort normal and breath sounds normal. He exhibits no tenderness.  Abdominal: Soft. Bowel sounds are normal. There is no tenderness. There is no rebound.  Musculoskeletal: He exhibits no tenderness.  Baseline ROM, no obvious new focal weakness.Medial aspect of the third toe has a smooth sided wound that is healing very slowly wit no signs of infection or drainage.  Left foot is mildly swollen.   Neurological:  Mental status and motor strength appears baseline for patient and situation.  Skin: No rash noted.  Psychiatric: He has a normal mood and affect.    ED Course  Procedures (including critical care time)     MDM  Patient with wound to his third right toe that he wanted checked. Wound is healing very slowly without signs of infection. He was advised to follow up with Dr. Margo Aye in a week if the healing has not begun. Advised to wear white socks while healing. Pt stable in ED with no significant deterioration in condition.The patient appears reasonably screened and/or stabilized for discharge and I doubt any other medical condition or other [facility] requiring further screening, evaluation, or treatment in the ED at this time prior to discharge.  MDM Reviewed: nursing note and vitals           Nicoletta Dress. Colon Branch, MD 05/13/12 [phone]

## 2012-05-16 DIAGNOSIS — Z79899 Other long term (current) drug therapy: Secondary | ICD-10-CM | POA: Diagnosis not present

## 2012-05-23 ENCOUNTER — Encounter (INDEPENDENT_AMBULATORY_CARE_PROVIDER_SITE_OTHER): Payer: Self-pay | Admitting: *Deleted

## 2012-06-07 ENCOUNTER — Encounter: Payer: Self-pay | Admitting: Orthopedic Surgery

## 2012-06-07 ENCOUNTER — Ambulatory Visit (INDEPENDENT_AMBULATORY_CARE_PROVIDER_SITE_OTHER): Payer: Medicare Other | Admitting: Orthopedic Surgery

## 2012-06-07 VITALS — BP 120/62 | Ht 71.0 in | Wt 240.0 lb

## 2012-06-07 DIAGNOSIS — M1710 Unilateral primary osteoarthritis, unspecified knee: Secondary | ICD-10-CM | POA: Insufficient documentation

## 2012-06-07 NOTE — Patient Instructions (Signed)
You have received a steroid shot. 15% of patients experience increased pain at the injection site with in the next 24 hours. This is best treated with ice and tylenol extra strength 2 tabs every 8 hours. If you are still having pain please call the office.    

## 2012-06-07 NOTE — Progress Notes (Signed)
Patient ID: Grant Stevens, male   DOB: December 07, 1940, 72 y.o.   MRN: [id number] Chief Complaint  Patient presents with  . Knee Pain    Right knee pain, no injury    72 yo male with 6 months of right knee pain   The patient developed pain over 6 months time with no acute injury. He did require emergency room evuation x-rays show arthritis of the knee. Complained of 8/10 sharp pain which is worse at night associated with some swelling. He did not take any over-the-counter medications. He denies catching locking or giving way  H/O congestive heart failure, bipolar depression status post triple bypass para followed by Dr. Carylon Perches  Current medications temazepam lisinopril carbamazepine low-dose aspirin furosemide risperidone atorvastainl His review of systems is notable for weight gain eye pain chest pain shortness of breath wheezing constipation diarrhea itching anxiety depression easy bleeding. He said a history of a heart attack   Vital signs are stable as recorded  General appearance is normal  The patient is alert and oriented x3  The patient's mood and affect are normal  Gait assessment: normal   The cardiovascular exam reveals normal pulses and temperature without edema or  swelling.  The lymphatic system is negative for palpable lymph nodes  The sensory exam is normal.  There are no pathologic reflexes.  Balance is normal.   Exam of the right knee  Inspection tender medial joint line  Range of motion normal  Stability normal  Strength normal  Skin normal   Mcmurrays sign normal    Xrays reviewed : OA right knee mild  DX: OA KNEE   REC INJECTION AND OTC MEDICATION AS NEEDED

## 2012-06-11 DIAGNOSIS — F3110 Bipolar disorder, current episode manic without psychotic features, unspecified: Secondary | ICD-10-CM | POA: Diagnosis not present

## 2012-06-26 ENCOUNTER — Telehealth (INDEPENDENT_AMBULATORY_CARE_PROVIDER_SITE_OTHER): Payer: Self-pay | Admitting: *Deleted

## 2012-06-26 NOTE — Telephone Encounter (Signed)
agree

## 2012-06-26 NOTE — Telephone Encounter (Signed)
  Procedure: tcs  Reason/Indication:  screening  Has patient had this procedure before?  10 yrs ago  If so, when, by whom and where?    Is there a family history of colon cancer?  no  Who?  What age when diagnosed?    Is patient diabetic?   no      Does patient have prosthetic heart valve?  no  Do you have a pacemaker?  no  Has patient ever had endocarditis? no  Has patient had joint replacement within last 12 months?  no  Is patient on Coumadin, Plavix and/or Aspirin? yes  Medications: asa 81 mg daily, furosemide 40 mg daily, carbamazepine 200 mg 4 tabs daily, lisinopril 10 mg daily, risperidone 1 mg 1/2 tab in am & a whole tab in pm, temazepam 15 mg prn  Allergies: nkda  Medication Adjustment: asa 2 days  Procedure date & time: 07/11/12 at 930

## 2012-06-27 ENCOUNTER — Encounter (HOSPITAL_COMMUNITY): Payer: Self-pay | Admitting: Pharmacy Technician

## 2012-07-11 ENCOUNTER — Encounter (HOSPITAL_COMMUNITY): Payer: Self-pay | Admitting: *Deleted

## 2012-07-11 ENCOUNTER — Encounter (HOSPITAL_COMMUNITY)
Admission: RE | Disposition: A | Discharge status: Home / Self Care | Payer: Self-pay | Source: Ambulatory Visit | Attending: Internal Medicine

## 2012-07-11 ENCOUNTER — Ambulatory Visit (HOSPITAL_COMMUNITY)
Admission: RE | Admit: 2012-07-11 | Discharge: 2012-07-11 | Disposition: A | Discharge status: Home / Self Care | Payer: Medicare Other | Source: Ambulatory Visit | Attending: Internal Medicine | Admitting: Internal Medicine

## 2012-07-11 DIAGNOSIS — K644 Residual hemorrhoidal skin tags: Secondary | ICD-10-CM | POA: Diagnosis not present

## 2012-07-11 DIAGNOSIS — D126 Benign neoplasm of colon, unspecified: Secondary | ICD-10-CM | POA: Insufficient documentation

## 2012-07-11 DIAGNOSIS — I10 Essential (primary) hypertension: Secondary | ICD-10-CM | POA: Insufficient documentation

## 2012-07-11 DIAGNOSIS — Z951 Presence of aortocoronary bypass graft: Secondary | ICD-10-CM | POA: Diagnosis not present

## 2012-07-11 DIAGNOSIS — Z1211 Encounter for screening for malignant neoplasm of colon: Secondary | ICD-10-CM

## 2012-07-11 HISTORY — PX: COLONOSCOPY: SHX5424

## 2012-07-11 SURGERY — COLONOSCOPY
Anesthesia: Moderate Sedation

## 2012-07-11 MED ORDER — STERILE WATER FOR IRRIGATION IR SOLN
Status: DC | PRN
  Administered 2012-07-11: 10:00:00

## 2012-07-11 MED ORDER — MEPERIDINE HCL 50 MG/ML IJ SOLN
INTRAMUSCULAR | Status: DC | PRN
  Administered 2012-07-11 (×2): 25 mg via INTRAVENOUS

## 2012-07-11 MED ORDER — MIDAZOLAM HCL 5 MG/5ML IJ SOLN
INTRAMUSCULAR | Status: AC
  Filled 2012-07-11: qty 10

## 2012-07-11 MED ORDER — MIDAZOLAM HCL 5 MG/5ML IJ SOLN
INTRAMUSCULAR | Status: DC | PRN
  Administered 2012-07-11: 1 mg via INTRAVENOUS
  Administered 2012-07-11: 2 mg via INTRAVENOUS
  Administered 2012-07-11: 1 mg via INTRAVENOUS
  Administered 2012-07-11 (×2): 2 mg via INTRAVENOUS

## 2012-07-11 MED ORDER — SODIUM CHLORIDE 0.9 % IV SOLN
INTRAVENOUS | Status: DC
  Administered 2012-07-11: 1000 mL via INTRAVENOUS

## 2012-07-11 MED ORDER — MEPERIDINE HCL 50 MG/ML IJ SOLN
INTRAMUSCULAR | Status: AC
  Filled 2012-07-11: qty 1

## 2012-07-11 NOTE — H&P (Signed)
Grant Stevens is an 72 y.o. male.   Chief Complaint: Patient's here for colonoscopy. HPI: Patient is 72 year old Caucasian male who is here for screening colonoscopy. His last examination was 10 years ago. He denies abdominal pain, change in bowel habits or rectal bleeding. History is negative for colorectal carcinoma.  Past Medical History  Diagnosis Date  . Hyponatremia     Serum sodium of 126 on 04/26/10  . ASCVD (arteriosclerotic cardiovascular disease)     CABG in 06/2001 post-IMI, EF 35%, 50% postoperatively;  . Hypertension   . Hyperlipidemia   . Tobacco abuse, in remission     15 pack years; discontinued in 1973  . Sleep apnea   . History of bipolar disorder   . Allergic rhinitis   . Asthma   . Erectile dysfunction   . Atrial fibrillation      WARCEF  . Myocardial infarction     Past Surgical History  Procedure Laterality Date  . Coronary artery bypass graft  06/2001    By Dr. Tyrone Sage  . Tonsillectomy    . Circumcision  10/2003    Family History  Problem Relation Age of Onset  . Asthma Mother   . Heart attack Father 7  . Diabetes Brother    Social History:  reports that he quit smoking about 41 years ago. He does not have any smokeless tobacco history on file. He reports that he does not drink alcohol or use illicit drugs.  Allergies: No Known Allergies  Medications Prior to Admission  Medication Sig Dispense Refill  . aspirin (BAYER LOW STRENGTH) 81 MG EC tablet Take 81 mg by mouth daily.       Marland Kitchen atorvastatin (LIPITOR) 80 MG tablet Take 80 mg by mouth at bedtime.        . carbamazepine (TEGRETOL) 200 MG tablet Take 400 mg by mouth 2 (two) times daily. Take 2 tablets in the morning and 2 at bedtime.      . furosemide (LASIX) 20 MG tablet Take 20 mg by mouth daily.      Marland Kitchen lisinopril (PRINIVIL,ZESTRIL) 10 MG tablet Take 10 mg by mouth daily.        . psyllium (HYDROCIL/METAMUCIL) 95 % PACK Take 1 packet by mouth daily.      . risperiDONE (RISPERDAL) 1 MG  tablet Take 0.5-1.5 mg by mouth 2 (two) times daily. Takes half tablet in the morning and 1.5 tablet at night      . temazepam (RESTORIL) 15 MG capsule Take 15-30 mg by mouth at bedtime as needed for sleep.      Marland Kitchen albuterol (PROVENTIL HFA;VENTOLIN HFA) 108 (90 BASE) MCG/ACT inhaler Inhale 2 puffs into the lungs every 6 (six) hours as needed for shortness of breath.         No results found for this or any previous visit (from the past 48 hour(s)). No results found.  ROS  Blood pressure 134/80, pulse 84, temperature 97.8 F (36.6 C), temperature source Oral, resp. rate 20, height 5\' 11"  (1.803 m), weight 235 lb (106.595 kg), SpO2 94.00%. Physical Exam  Constitutional: He appears well-developed and well-nourished.  HENT:  Mouth/Throat: Oropharynx is clear and moist.  Eyes: Conjunctivae are normal. No scleral icterus.  Neck: No thyromegaly present.  Cardiovascular: Normal rate, regular rhythm and normal heart sounds.   No murmur heard. GI: Soft. He exhibits no distension. There is no tenderness.  Musculoskeletal: He exhibits no edema.  Lymphadenopathy:    He has no cervical adenopathy.  Neurological: He is alert.  Skin: Skin is warm and dry.     Assessment/Plan Average risk screening colonoscopy.  REHMAN,NAJEEB U 07/11/2012, 9:29 AM

## 2012-07-11 NOTE — Op Note (Signed)
COLONOSCOPY PROCEDURE REPORT  PATIENT:  Grant Stevens  MR#:  [id number] Birthdate:  [birth year], 72 y.o., male Endoscopist:  Dr. Malissa Hippo, MD Referred By:  Dr. Carylon Perches, MD. Procedure Date: 07/11/2012  Procedure:   Colonoscopy  Indications: Patient is 72 year old Caucasian male who is here for average risk screening colonoscopy. His last exam was 10 years ago.  Informed Consent:  The procedure and risks were reviewed with the patient and informed consent was obtained.  Medications:  Demerol 50 mg IV Versed 8 mg IV  Description of procedure:  After a digital rectal exam was performed, that colonoscope was advanced from the anus through the rectum and colon to the area of the cecum, ileocecal valve and appendiceal orifice. The cecum was deeply intubated. These structures were well-seen after vigorous wash and photographed for the record. From the level of the cecum and ileocecal valve, the scope was slowly and cautiously withdrawn. The mucosal surfaces were carefully surveyed utilizing scope tip to flexion to facilitate fold flattening as needed. The scope was pulled down into the rectum where a thorough exam including retroflexion was performed.  Findings:   Prep satisfactory but prep at cecum and ascending colon was marginal requiring vigorous washing. Over 1 L of water was used. Redundant colon. Mall polyp ablated via cold biopsy from proximal transverse colon. Normal rectal mucosa. Small hemorrhoids below the dentate line.   Therapeutic/Diagnostic Maneuvers Performed:  See above  Complications:  None  Cecal Withdrawal Time:  9 minutes  Impression:  Examination performed to cecum. Quality of prep somewhat compromised examination of ascending colon and cecum. Redundant colon. Small polyp ablated via cold biopsy from proximal transverse colon. Small external hemorrhoids.  Recommendations:  Standard instructions given. I will contact patient with biopsy results and  further recommendations.  Francille Wittmann U  07/11/2012 10:42 AM  CC: Dr. Carylon Perches, MD & Dr. Bonnetta Barry ref. provider found

## 2012-07-17 ENCOUNTER — Encounter (HOSPITAL_COMMUNITY): Payer: Self-pay | Admitting: Internal Medicine

## 2012-07-19 ENCOUNTER — Encounter (HOSPITAL_COMMUNITY): Payer: Self-pay | Admitting: *Deleted

## 2012-07-19 ENCOUNTER — Emergency Department (HOSPITAL_COMMUNITY)
Admission: EM | Admit: 2012-07-19 | Discharge: 2012-07-19 | Disposition: A | Discharge status: Home / Self Care | Payer: Medicare Other | Attending: Emergency Medicine | Admitting: Emergency Medicine

## 2012-07-19 DIAGNOSIS — Z87891 Personal history of nicotine dependence: Secondary | ICD-10-CM | POA: Insufficient documentation

## 2012-07-19 DIAGNOSIS — Z79899 Other long term (current) drug therapy: Secondary | ICD-10-CM | POA: Insufficient documentation

## 2012-07-19 DIAGNOSIS — J45909 Unspecified asthma, uncomplicated: Secondary | ICD-10-CM | POA: Insufficient documentation

## 2012-07-19 DIAGNOSIS — Y939 Activity, unspecified: Secondary | ICD-10-CM | POA: Insufficient documentation

## 2012-07-19 DIAGNOSIS — I10 Essential (primary) hypertension: Secondary | ICD-10-CM | POA: Diagnosis not present

## 2012-07-19 DIAGNOSIS — S60551A Superficial foreign body of right hand, initial encounter: Secondary | ICD-10-CM

## 2012-07-19 DIAGNOSIS — Z8659 Personal history of other mental and behavioral disorders: Secondary | ICD-10-CM | POA: Insufficient documentation

## 2012-07-19 DIAGNOSIS — I252 Old myocardial infarction: Secondary | ICD-10-CM | POA: Diagnosis not present

## 2012-07-19 DIAGNOSIS — Z7982 Long term (current) use of aspirin: Secondary | ICD-10-CM | POA: Insufficient documentation

## 2012-07-19 DIAGNOSIS — R21 Rash and other nonspecific skin eruption: Secondary | ICD-10-CM | POA: Diagnosis not present

## 2012-07-19 DIAGNOSIS — Z862 Personal history of diseases of the blood and blood-forming organs and certain disorders involving the immune mechanism: Secondary | ICD-10-CM | POA: Diagnosis not present

## 2012-07-19 DIAGNOSIS — I4891 Unspecified atrial fibrillation: Secondary | ICD-10-CM | POA: Diagnosis not present

## 2012-07-19 DIAGNOSIS — IMO0002 Reserved for concepts with insufficient information to code with codable children: Secondary | ICD-10-CM | POA: Insufficient documentation

## 2012-07-19 DIAGNOSIS — Z8679 Personal history of other diseases of the circulatory system: Secondary | ICD-10-CM | POA: Insufficient documentation

## 2012-07-19 DIAGNOSIS — G4730 Sleep apnea, unspecified: Secondary | ICD-10-CM | POA: Insufficient documentation

## 2012-07-19 DIAGNOSIS — L089 Local infection of the skin and subcutaneous tissue, unspecified: Secondary | ICD-10-CM | POA: Insufficient documentation

## 2012-07-19 DIAGNOSIS — Z87448 Personal history of other diseases of urinary system: Secondary | ICD-10-CM | POA: Diagnosis not present

## 2012-07-19 DIAGNOSIS — Z8639 Personal history of other endocrine, nutritional and metabolic disease: Secondary | ICD-10-CM | POA: Insufficient documentation

## 2012-07-19 DIAGNOSIS — Y929 Unspecified place or not applicable: Secondary | ICD-10-CM | POA: Insufficient documentation

## 2012-07-19 DIAGNOSIS — S61409A Unspecified open wound of unspecified hand, initial encounter: Secondary | ICD-10-CM | POA: Diagnosis not present

## 2012-07-19 DIAGNOSIS — W57XXXA Bitten or stung by nonvenomous insect and other nonvenomous arthropods, initial encounter: Secondary | ICD-10-CM

## 2012-07-19 NOTE — ED Provider Notes (Signed)
History  This chart was scribed for Shelda Jakes, MD by Bennett Scrape, ED Scribe. This patient was seen in room [facility] and the patient's care was started at 7:13 AM.  CSN: [id number]  Arrival date & time 07/19/12  4098   First MD Initiated Contact with Patient 07/19/12 [phone]     Chief Complaint  Patient presents with  . Tick Removal    bite to lower leg  . Foreign Body in Skin    splinter in right hand    Patient is a 72 y.o. male presenting with foreign body. The history is provided by the patient. No language interpreter was used.  Foreign Body Intake: right palm. Suspected object:  Wood Quality: none. Pain severity:  No pain Timing:  Constant Progression:  Unchanged Chronicity:  New Exacerbated by: none. Ineffective treatments:  Removal attempts with tweezers Associated symptoms: no abdominal pain, no congestion, no cough, no nausea, no sore throat and no vomiting     HPI Comments: Grant Stevens is a 72 y.o. male who presents to the Emergency Department complaining of a foreign body described as a splinter in the palm of his right hand and tick bites to the dorsal aspect of the left foot and the outer right thigh. He states that he was picking up wood when a large piece became stuck. He removed the large piece but believes there could be a smaller piece still stuck. He denies having any ticks on his body currently. He denies any other symptoms associated with these complaints. He has a h/o HTN and HLD.   PCP is Dr. Ouida Sills  Past Medical History  Diagnosis Date  . Hyponatremia     Serum sodium of 126 on 04/26/10  . ASCVD (arteriosclerotic cardiovascular disease)     CABG in 06/2001 post-IMI, EF 35%, 50% postoperatively;  . Hypertension   . Hyperlipidemia   . Tobacco abuse, in remission     15 pack years; discontinued in 1973  . Sleep apnea   . History of bipolar disorder   . Allergic rhinitis   . Asthma   . Erectile dysfunction   . Atrial fibrillation       WARCEF  . Myocardial infarction    Past Surgical History  Procedure Laterality Date  . Coronary artery bypass graft  06/2001    By Dr. Tyrone Sage  . Tonsillectomy    . Circumcision  10/2003  . Colonoscopy N/A 07/11/2012    Procedure: COLONOSCOPY;  Surgeon: Malissa Hippo, MD;  Location: [facility];  Service: Endoscopy;  Laterality: N/A;  830-moved to 930 Ann to notify pt   Family History  Problem Relation Age of Onset  . Asthma Mother   . Heart attack Father 33  . Diabetes Brother    History  Substance Use Topics  . Smoking status: Former Smoker -- 1.00 packs/day for 15 years    Quit date: 01/11/1971  . Smokeless tobacco: Not on file  . Alcohol Use: No    Review of Systems  Constitutional: Negative for fever and chills.  HENT: Negative for congestion, sore throat and neck pain.   Eyes: Negative for visual disturbance.  Respiratory: Negative for cough and shortness of breath.   Cardiovascular: Negative for chest pain and leg swelling.  Gastrointestinal: Negative for nausea, vomiting, abdominal pain and diarrhea.  Genitourinary: Negative for dysuria.  Musculoskeletal: Negative for back pain.  Skin: Positive for rash.  Neurological: Negative for headaches.  Hematological: Does not bruise/bleed easily.  Psychiatric/Behavioral:  Negative for confusion.    Allergies  Review of patient's allergies indicates no known allergies.  Home Medications   Current Outpatient Rx  Name  Route  Sig  Dispense  Refill  . albuterol (PROVENTIL HFA;VENTOLIN HFA) 108 (90 BASE) MCG/ACT inhaler   Inhalation   Inhale 2 puffs into the lungs every 6 (six) hours as needed for shortness of breath.          Marland Kitchen aspirin (BAYER LOW STRENGTH) 81 MG EC tablet   Oral   Take 81 mg by mouth daily.          Marland Kitchen atorvastatin (LIPITOR) 80 MG tablet   Oral   Take 80 mg by mouth at bedtime.           . carbamazepine (TEGRETOL) 200 MG tablet   Oral   Take 400 mg by mouth 2 (two) times daily.  Take 2 tablets in the morning and 2 at bedtime.         . furosemide (LASIX) 20 MG tablet   Oral   Take 20 mg by mouth daily.         Marland Kitchen lisinopril (PRINIVIL,ZESTRIL) 10 MG tablet   Oral   Take 10 mg by mouth daily.           . psyllium (HYDROCIL/METAMUCIL) 95 % PACK   Oral   Take 1 packet by mouth daily.         . risperiDONE (RISPERDAL) 1 MG tablet   Oral   Take 0.5-1.5 mg by mouth 2 (two) times daily. Takes half tablet in the morning and 1.5 tablet at night         . temazepam (RESTORIL) 15 MG capsule   Oral   Take 15-30 mg by mouth at bedtime as needed for sleep.          Triage Vitals: BP 142/84  Pulse 79  Temp(Src) 98 F (36.7 C) (Oral)  Ht 5\' 11"  (1.803 m)  Wt 240 lb (108.863 kg)  BMI 33.49 kg/m2  SpO2 98%  Physical Exam  Nursing note and vitals reviewed. Constitutional: He is oriented to person, place, and time. He appears well-developed and well-nourished. No distress.  HENT:  Head: Normocephalic and atraumatic.  Mouth/Throat: Oropharynx is clear and moist.  Eyes: Conjunctivae and EOM are normal.  Sclera are clear  Neck: Neck supple. No tracheal deviation present.  Cardiovascular: Normal rate and regular rhythm.   No murmur heard. Pulses:      Dorsalis pedis pulses are 2+ on the right side, and 2+ on the left side.  Pulmonary/Chest: Effort normal and breath sounds normal. No respiratory distress. He has no wheezes.  Abdominal: Soft. Bowel sounds are normal. He exhibits no distension. There is no tenderness.  Musculoskeletal: Normal range of motion. He exhibits no edema (no ankle swelling).  Neurological: He is alert and oriented to person, place, and time.  Pt able to move both sets of fingers and toes  Skin: Skin is warm and dry. No rash noted.  Localized reactions to the dorsal aspect of the left foot and the lateral aspect of the right thigh, no foreign bodies visualized, 5 mm of heavy callose in the webspace between the right thumb and  forefinger with a 5 mm bump that is non-tender with no erythema, dark area that could represent a piece of wood along the edge, cap refill is 1 sec, radial pulse is 2+ in the right wrist  Psychiatric: He has a normal mood and  affect. His behavior is normal.    ED Course  Procedures (including critical care time)  DIAGNOSTIC STUDIES: Oxygen Saturation is 98% on room air, normal by my interpretation.    COORDINATION OF CARE: 7:30 AM-Advised pt that there are no ticks present. Also advised pt that the possible splinter could work itself out on its own. Discussed discharge plan which includes referral to Dr. Malvin Johns and hand specialist with pt at bedside and pt agreed to plan.   Labs Reviewed - No data to display No results found.  1. Tick bites   2. Foreign body of hand, right, initial encounter     MDM  Patient with several tick bites to lower extremities no signs of infection no flulike symptoms or tick borne illness type symptoms currently. No evidence of any retained ticks. Patient also with an old of injury to the right hand web space between thumb and index finger that may very well have a small piece of wood in it patient pulled a lot of without wound injury first occurred it was related to lumbar no signs of infection. I have recommended that patient followup of with general surgery locally or consider hand surgery. However could wait to see if the foreign body will come out on its own since not causing any problems or any evidence of infection.  I personally performed the services described in this documentation, which was scribed in my presence. The recorded information has been reviewed and is accurate.     Shelda Jakes, MD 07/19/12 [phone]

## 2012-07-19 NOTE — ED Notes (Signed)
Supervisor in to talk to patient about delay in care.at this time.

## 2012-07-19 NOTE — ED Notes (Signed)
Patient requests to see house supervisor. States it is taking too long to be seen

## 2012-07-23 DIAGNOSIS — F3110 Bipolar disorder, current episode manic without psychotic features, unspecified: Secondary | ICD-10-CM | POA: Diagnosis not present

## 2012-08-13 ENCOUNTER — Encounter (INDEPENDENT_AMBULATORY_CARE_PROVIDER_SITE_OTHER): Payer: Self-pay | Admitting: *Deleted

## 2012-09-18 ENCOUNTER — Encounter: Payer: Self-pay | Admitting: Adult Health

## 2012-09-18 ENCOUNTER — Encounter: Payer: Self-pay | Admitting: Cardiology

## 2012-09-18 ENCOUNTER — Encounter: Payer: Medicare Other | Admitting: Cardiology

## 2012-09-18 NOTE — Progress Notes (Signed)
No show  This encounter was created in error - please disregard.

## 2012-09-25 DIAGNOSIS — Z79899 Other long term (current) drug therapy: Secondary | ICD-10-CM | POA: Diagnosis not present

## 2012-10-02 DIAGNOSIS — I509 Heart failure, unspecified: Secondary | ICD-10-CM | POA: Diagnosis not present

## 2012-10-08 DIAGNOSIS — N529 Male erectile dysfunction, unspecified: Secondary | ICD-10-CM | POA: Diagnosis not present

## 2012-10-09 ENCOUNTER — Encounter: Payer: Medicare Other | Admitting: Cardiology

## 2012-10-09 ENCOUNTER — Encounter: Payer: Self-pay | Admitting: Cardiology

## 2012-10-09 NOTE — Progress Notes (Signed)
No show  This encounter was created in error - please disregard.

## 2012-10-22 DIAGNOSIS — F3110 Bipolar disorder, current episode manic without psychotic features, unspecified: Secondary | ICD-10-CM | POA: Diagnosis not present

## 2012-10-31 ENCOUNTER — Encounter: Payer: Self-pay | Admitting: Cardiology

## 2012-10-31 ENCOUNTER — Ambulatory Visit (INDEPENDENT_AMBULATORY_CARE_PROVIDER_SITE_OTHER): Payer: Medicare Other | Admitting: Cardiology

## 2012-10-31 VITALS — BP 110/68 | HR 80 | Ht 71.0 in | Wt 232.0 lb

## 2012-10-31 DIAGNOSIS — Z8679 Personal history of other diseases of the circulatory system: Secondary | ICD-10-CM

## 2012-10-31 DIAGNOSIS — I10 Essential (primary) hypertension: Secondary | ICD-10-CM

## 2012-10-31 DIAGNOSIS — E785 Hyperlipidemia, unspecified: Secondary | ICD-10-CM | POA: Diagnosis not present

## 2012-10-31 DIAGNOSIS — I2510 Atherosclerotic heart disease of native coronary artery without angina pectoris: Secondary | ICD-10-CM | POA: Diagnosis not present

## 2012-10-31 NOTE — Patient Instructions (Addendum)
Your physician recommends that you schedule a follow-up appointment in: ONE YEAR 

## 2012-10-31 NOTE — Assessment & Plan Note (Signed)
Blood pressure is normal today. 

## 2012-10-31 NOTE — Progress Notes (Signed)
Clinical Summary [name] is a 72 y.o.male presenting for office followup. He is a former patient of Dr. Dietrich Stevens, last seen in September 2013. He presents for a routine visit, reports no angina symptoms or unusual shortness of breath. He has been exercising at a local gym on a regular basis with friends. He tells me that his wife for 41 years just recently passed away earlier in 10-30-22, reportedly with cardiac arrest. He is obviously still grieving but seems to have reasonable support from friends and family. He also tells me that he was a neighbor of my father growing up and knew him well in younger years.  Exercise Myoview in 2009 demonstrated no ST segment abnormalities with evidence of scar affecting the inferior wall but no active ischemia. LVEF was 38% at that time. Echocardiogram at that time however indicated LVEF in 50% range with inferior and inferior septal akinesis also consistent with scar.  Today we reviewed his medications. His ECG shows sinus rhythm with right bundle branch block, old inferiolateral infarct pattern. We did discuss considerations for followup stress testing in light of the passage of time since his previous assessment, although elected to continue observation at this time since he has had no significant symptomatology. ECG is also stable.   No Known Allergies  Current Outpatient Prescriptions  Medication Sig Dispense Refill  . aspirin (BAYER LOW STRENGTH) 81 MG EC tablet Take 81 mg by mouth daily.       Marland Kitchen atorvastatin (LIPITOR) 80 MG tablet Take 80 mg by mouth at bedtime.        . carbamazepine (TEGRETOL) 200 MG tablet Take 200 mg by mouth 4 (four) times daily. Take 2 tablets in the morning and 2 at bedtime.      . furosemide (LASIX) 20 MG tablet Take 40 mg by mouth daily.       Marland Kitchen lisinopril (PRINIVIL,ZESTRIL) 10 MG tablet Take 10 mg by mouth daily.        . psyllium (HYDROCIL/METAMUCIL) 95 % PACK Take 1 packet by mouth daily.      . risperiDONE  (RISPERDAL) 1 MG tablet Take 0.5-1.5 mg by mouth 2 (two) times daily. Takes half tablet in the morning and 1.5 tablet at night      . temazepam (RESTORIL) 15 MG capsule Take 15-30 mg by mouth at bedtime as needed for sleep.       No current facility-administered medications for this visit.    Past Medical History  Diagnosis Date  . Coronary atherosclerosis of native coronary artery     Multivessel s/p CABG in 06/2001 post-IMI, LVEF 35% up to 50% postoperatively;  . Essential hypertension, benign   . Hyperlipidemia   . Sleep apnea   . History of bipolar disorder   . Allergic rhinitis   . Asthma   . Erectile dysfunction   . Atrial fibrillation     Remote history - WARCEF  . Myocardial infarction     Past Surgical History  Procedure Laterality Date  . Coronary artery bypass graft  08/2000    Dr. Tyrone Stevens - LIMA to LAD, SVG to diagonal, SVG to PDA  . Tonsillectomy    . Circumcision  10/30/2003  . Colonoscopy N/A 07/11/2012    Procedure: COLONOSCOPY;  Surgeon: Grant Hippo, MD;  [facility];  Service: Endoscopy;  Laterality: N/A;  830-moved to 930 Ann to notify pt    Social History [name] reports that he quit smoking about 41 years ago.  His smoking use included Cigarettes. He has a 15 pack-year smoking history. He does not have any smokeless tobacco history on file. [name] reports that he does not drink alcohol.  Review of Systems Negative except as outlined above.  Physical Examination Filed Vitals:   10/31/12 1121  BP: 110/68  Pulse: 80   Filed Weights   10/31/12 1121  Weight: 232 lb (105.235 kg)   Patient comfortable at rest. HEENT: Conjunctiva and lids normal, oropharynx clear. Neck: Supple, no elevated JVP or carotid bruits, no thyromegaly. Lungs: Clear to auscultation, nonlabored breathing at rest. Cardiac: Regular rate and rhythm, no S3 or significant systolic murmur, no pericardial rub. Abdomen: Soft, nontender, bowel sounds present, no  guarding or rebound. Extremities: No pitting edema, distal pulses 2+. Skin: Warm and dry. Musculoskeletal: No kyphosis. Neuropsychiatric: Alert and oriented x3, affect grossly appropriate.   Problem List and Plan   Coronary atherosclerosis of native coronary artery Clinically stable as is ECG. He reports continued regular exercise regimen with no new symptoms. Plan is to continue medical therapy and annual followup, although we can certainly reassess him sooner if clinical status changes. Keep regular visits with Dr. Ouida Stevens.  Essential hypertension, benign Blood pressure is normal today.  HYPERLIPIDEMIA Lipids are followed by Dr. Ouida Stevens, have typically been well controlled.  History of atrial fibrillation Remote history, none observed in several years. He is not anticoagulated.    Grant Stevens, M.D., F.A.C.C.

## 2012-10-31 NOTE — Assessment & Plan Note (Signed)
Lipids are followed by Dr. Ouida Sills, have typically been well controlled.

## 2012-10-31 NOTE — Assessment & Plan Note (Signed)
Remote history, none observed in several years. He is not anticoagulated.

## 2012-10-31 NOTE — Assessment & Plan Note (Signed)
Clinically stable as is ECG. He reports continued regular exercise regimen with no new symptoms. Plan is to continue medical therapy and annual followup, although we can certainly reassess him sooner if clinical status changes. Keep regular visits with Dr. Ouida Sills.

## 2012-11-21 DIAGNOSIS — F3110 Bipolar disorder, current episode manic without psychotic features, unspecified: Secondary | ICD-10-CM | POA: Diagnosis not present

## 2013-01-11 DIAGNOSIS — I509 Heart failure, unspecified: Secondary | ICD-10-CM | POA: Diagnosis not present

## 2013-01-11 DIAGNOSIS — Z79899 Other long term (current) drug therapy: Secondary | ICD-10-CM | POA: Diagnosis not present

## 2013-01-18 DIAGNOSIS — E871 Hypo-osmolality and hyponatremia: Secondary | ICD-10-CM | POA: Diagnosis not present

## 2013-01-18 DIAGNOSIS — I2510 Atherosclerotic heart disease of native coronary artery without angina pectoris: Secondary | ICD-10-CM | POA: Diagnosis not present

## 2013-01-18 DIAGNOSIS — I509 Heart failure, unspecified: Secondary | ICD-10-CM | POA: Diagnosis not present

## 2013-01-30 DIAGNOSIS — F3110 Bipolar disorder, current episode manic without psychotic features, unspecified: Secondary | ICD-10-CM | POA: Diagnosis not present

## 2013-02-20 DIAGNOSIS — H01009 Unspecified blepharitis unspecified eye, unspecified eyelid: Secondary | ICD-10-CM | POA: Diagnosis not present

## 2013-02-20 DIAGNOSIS — H52229 Regular astigmatism, unspecified eye: Secondary | ICD-10-CM | POA: Diagnosis not present

## 2013-02-20 DIAGNOSIS — H5210 Myopia, unspecified eye: Secondary | ICD-10-CM | POA: Diagnosis not present

## 2013-02-20 DIAGNOSIS — IMO0002 Reserved for concepts with insufficient information to code with codable children: Secondary | ICD-10-CM | POA: Diagnosis not present

## 2013-03-28 ENCOUNTER — Ambulatory Visit (INDEPENDENT_AMBULATORY_CARE_PROVIDER_SITE_OTHER): Payer: Medicare Other | Admitting: Otolaryngology

## 2013-04-25 DIAGNOSIS — D235 Other benign neoplasm of skin of trunk: Secondary | ICD-10-CM | POA: Diagnosis not present

## 2013-04-25 DIAGNOSIS — D219 Benign neoplasm of connective and other soft tissue, unspecified: Secondary | ICD-10-CM | POA: Diagnosis not present

## 2013-04-29 DIAGNOSIS — Z79899 Other long term (current) drug therapy: Secondary | ICD-10-CM | POA: Diagnosis not present

## 2013-04-29 DIAGNOSIS — E785 Hyperlipidemia, unspecified: Secondary | ICD-10-CM | POA: Diagnosis not present

## 2013-04-29 DIAGNOSIS — I509 Heart failure, unspecified: Secondary | ICD-10-CM | POA: Diagnosis not present

## 2013-05-01 DIAGNOSIS — F3110 Bipolar disorder, current episode manic without psychotic features, unspecified: Secondary | ICD-10-CM | POA: Diagnosis not present

## 2013-05-02 ENCOUNTER — Encounter: Payer: Self-pay | Admitting: Cardiology

## 2013-05-06 DIAGNOSIS — E785 Hyperlipidemia, unspecified: Secondary | ICD-10-CM | POA: Diagnosis not present

## 2013-05-06 DIAGNOSIS — I509 Heart failure, unspecified: Secondary | ICD-10-CM | POA: Diagnosis not present

## 2013-05-06 DIAGNOSIS — E871 Hypo-osmolality and hyponatremia: Secondary | ICD-10-CM | POA: Diagnosis not present

## 2013-07-31 DIAGNOSIS — I509 Heart failure, unspecified: Secondary | ICD-10-CM | POA: Diagnosis not present

## 2013-07-31 DIAGNOSIS — Z79899 Other long term (current) drug therapy: Secondary | ICD-10-CM | POA: Diagnosis not present

## 2013-08-07 DIAGNOSIS — F319 Bipolar disorder, unspecified: Secondary | ICD-10-CM | POA: Diagnosis not present

## 2013-08-07 DIAGNOSIS — Z23 Encounter for immunization: Secondary | ICD-10-CM | POA: Diagnosis not present

## 2013-08-07 DIAGNOSIS — I509 Heart failure, unspecified: Secondary | ICD-10-CM | POA: Diagnosis not present

## 2013-08-07 DIAGNOSIS — E871 Hypo-osmolality and hyponatremia: Secondary | ICD-10-CM | POA: Diagnosis not present

## 2013-08-16 DIAGNOSIS — R5381 Other malaise: Secondary | ICD-10-CM | POA: Diagnosis not present

## 2013-08-16 DIAGNOSIS — R5383 Other fatigue: Secondary | ICD-10-CM | POA: Diagnosis not present

## 2013-08-28 DIAGNOSIS — F3110 Bipolar disorder, current episode manic without psychotic features, unspecified: Secondary | ICD-10-CM | POA: Diagnosis not present

## 2013-09-09 DIAGNOSIS — Z79899 Other long term (current) drug therapy: Secondary | ICD-10-CM | POA: Diagnosis not present

## 2013-09-11 DIAGNOSIS — Z79899 Other long term (current) drug therapy: Secondary | ICD-10-CM | POA: Diagnosis not present

## 2013-11-06 ENCOUNTER — Encounter: Payer: Self-pay | Admitting: Cardiology

## 2013-11-06 ENCOUNTER — Ambulatory Visit (INDEPENDENT_AMBULATORY_CARE_PROVIDER_SITE_OTHER): Payer: Medicare Other | Admitting: Cardiology

## 2013-11-06 VITALS — BP 118/60 | HR 64 | Ht 71.0 in | Wt 227.0 lb

## 2013-11-06 DIAGNOSIS — I4891 Unspecified atrial fibrillation: Secondary | ICD-10-CM | POA: Diagnosis not present

## 2013-11-06 DIAGNOSIS — I10 Essential (primary) hypertension: Secondary | ICD-10-CM | POA: Diagnosis not present

## 2013-11-06 DIAGNOSIS — I2510 Atherosclerotic heart disease of native coronary artery without angina pectoris: Secondary | ICD-10-CM | POA: Diagnosis not present

## 2013-11-06 DIAGNOSIS — E782 Mixed hyperlipidemia: Secondary | ICD-10-CM

## 2013-11-06 MED ORDER — LISINOPRIL 5 MG PO TABS: 5.0000 mg | ORAL_TABLET | Freq: Every day | ORAL | Status: DC

## 2013-11-06 NOTE — Assessment & Plan Note (Signed)
He continues on Lipitor with good lipid control, LDL 74 earlier in the year.

## 2013-11-06 NOTE — Assessment & Plan Note (Signed)
It has been 6 years since his last stress test. He continues on medical therapy, I encouraged regular activity and exercise regimen. We will obtain a follow-up exercise Cardiolite to reassess ischemic burden.

## 2013-11-06 NOTE — Assessment & Plan Note (Signed)
For now would like to cut lisinopril back to 5 mg daily and see if he feels any better.

## 2013-11-06 NOTE — Patient Instructions (Addendum)
Your physician wants you to follow-up in: 1 year You will receive a reminder letter in the mail two months in advance. If you don't receive a letter, please call our office to schedule the follow-up appointment.       Your physician has recommended you make the following change in your medication:     DECREASE Lisinopril to 5 mg daily    Your physician has requested that you have en exercise stress myoview. For further information please visit [email or web address]. Please follow instruction sheet, as given.         Thank you for choosing [facility] !

## 2013-11-06 NOTE — Progress Notes (Signed)
Reason for visit: CAD, hypertension, hyperlipidemia  Clinical Summary Grant Stevens is a 73 y.o.male last seen in October 2014. He states that he has felt somewhat weak, has been concerned that his blood pressure has been too low. He tells her that it is not unusual for his systolic to be around 086. Otherwise, denies any exertional angina, reports NYHA class II dyspnea. He continues to exercise 3 or 4 days a week at a local gym.  ECG today shows sinus rhythm with right bundle branch block, evidence of old inferior and anteroseptal infarct  Lab work from April of this year showed hemoglobin 13.5, platelets 164, potassium 4.7, BUN 11, creatinine 0.9, normal LFTs, cholesterol 129, triglycerides 56, HDL 44, LDL 74.  Exercise Myoview in 2009 demonstrated no ST segment abnormalities with evidence of scar affecting the inferior wall but no active ischemia. LVEF was 38% at that time. Echocardiogram at that time however indicated LVEF in 50% range with inferior and inferior septal akinesis also consistent with scar.   No Known Allergies  Current Outpatient Prescriptions  Medication Sig Dispense Refill  . aspirin (BAYER LOW STRENGTH) 81 MG EC tablet Take 81 mg by mouth daily.       Marland Kitchen atorvastatin (LIPITOR) 80 MG tablet Take 80 mg by mouth at bedtime.        . carbamazepine (TEGRETOL) 200 MG tablet Take 200 mg by mouth 4 (four) times daily. Take 2 tablets in the morning and 2 at bedtime.      . furosemide (LASIX) 20 MG tablet Take 40 mg by mouth daily.       . risperiDONE (RISPERDAL) 1 MG tablet Take 0.5-1.5 mg by mouth 2 (two) times daily. Takes half tablet in the morning and 1.5 tablet at night      . lisinopril (PRINIVIL,ZESTRIL) 5 MG tablet Take 1 tablet (5 mg total) by mouth daily.  90 tablet  3   No current facility-administered medications for this visit.    Past Medical History  Diagnosis Date  . Coronary atherosclerosis of native coronary artery     Multivessel s/p CABG in 06/2001  post-IMI, LVEF 35% up to 50% postoperatively;  . Essential hypertension, benign   . Hyperlipidemia   . Sleep apnea   . History of bipolar disorder   . Allergic rhinitis   . Asthma   . Erectile dysfunction   . Atrial fibrillation     Remote history - WARCEF  . Myocardial infarction     Past Surgical History  Procedure Laterality Date  . Coronary artery bypass graft  08/2000    Dr. Servando Snare - LIMA to LAD, SVG to diagonal, SVG to PDA  . Tonsillectomy    . Circumcision  10/2003  . Colonoscopy N/A 07/11/2012    Procedure: COLONOSCOPY;  Surgeon: Rogene Houston, MD;  [facility];  Service: Endoscopy;  Laterality: N/A;  830-moved to 29 Ann to notify pt    Social History Grant Stevens reports that he quit smoking about 42 years ago. His smoking use included Cigarettes. He has a 15 pack-year smoking history. He does not have any smokeless tobacco history on file. Grant Stevens reports that he does not drink alcohol.  Review of Systems Complete review of systems negative except as otherwise outlined in the clinical summary and also the following. No palpitations or syncope. No claudication.  Physical Examination Filed Vitals:   11/06/13 1118  BP: 118/60  Pulse: 64   Filed Weights   11/06/13 1118  Weight: 227 lb (102.967 kg)    Patient comfortable at rest.  HEENT: Conjunctiva and lids normal, oropharynx clear.  Neck: Supple, no elevated JVP or carotid bruits, no thyromegaly.  Lungs: Clear to auscultation, nonlabored breathing at rest.  Cardiac: Regular rate and rhythm, no S3 or significant systolic murmur, no pericardial rub.  Abdomen: Soft, nontender, bowel sounds present, no guarding or rebound.  Extremities: No pitting edema, distal pulses 2+.  Skin: Warm and dry.  Musculoskeletal: No kyphosis.  Neuropsychiatric: Alert and oriented x3, affect grossly appropriate.   Problem List and Plan   Coronary atherosclerosis of native coronary artery It has been 6 years  since his last stress test. He continues on medical therapy, I encouraged regular activity and exercise regimen. We will obtain a follow-up exercise Cardiolite to reassess ischemic burden.  Essential hypertension, benign For now would like to cut lisinopril back to 5 mg daily and see if he feels any better.  Mixed hyperlipidemia He continues on Lipitor with good lipid control, LDL 74 earlier in the year.    [name], M.D., F.A.C.C.

## 2013-11-15 ENCOUNTER — Encounter (HOSPITAL_COMMUNITY)
Admission: RE | Admit: 2013-11-15 | Discharge: 2013-11-15 | Disposition: A | Discharge status: Home / Self Care | Payer: Medicare Other | Source: Ambulatory Visit | Attending: Cardiology | Admitting: Cardiology

## 2013-11-15 ENCOUNTER — Ambulatory Visit (HOSPITAL_COMMUNITY)
Admission: RE | Admit: 2013-11-15 | Discharge: 2013-11-15 | Disposition: A | Discharge status: Home / Self Care | Payer: Medicare Other | Source: Ambulatory Visit | Attending: Cardiology | Admitting: Cardiology

## 2013-11-15 ENCOUNTER — Encounter (HOSPITAL_COMMUNITY): Payer: Self-pay

## 2013-11-15 DIAGNOSIS — I252 Old myocardial infarction: Secondary | ICD-10-CM | POA: Insufficient documentation

## 2013-11-15 DIAGNOSIS — I517 Cardiomegaly: Secondary | ICD-10-CM | POA: Insufficient documentation

## 2013-11-15 DIAGNOSIS — I2510 Atherosclerotic heart disease of native coronary artery without angina pectoris: Secondary | ICD-10-CM | POA: Insufficient documentation

## 2013-11-15 DIAGNOSIS — R531 Weakness: Secondary | ICD-10-CM | POA: Insufficient documentation

## 2013-11-15 DIAGNOSIS — I5189 Other ill-defined heart diseases: Secondary | ICD-10-CM | POA: Insufficient documentation

## 2013-11-15 DIAGNOSIS — R0602 Shortness of breath: Secondary | ICD-10-CM | POA: Diagnosis not present

## 2013-11-15 HISTORY — DX: Heart failure, unspecified: I50.9

## 2013-11-15 MED ORDER — SODIUM CHLORIDE 0.9 % IJ SOLN
10.0000 mL | INTRAMUSCULAR | Status: DC | PRN
  Filled 2013-11-15: qty 10

## 2013-11-15 MED ORDER — TECHNETIUM TC 99M SESTAMIBI - CARDIOLITE
10.0000 | Freq: Once | INTRAVENOUS | Status: AC | PRN
  Administered 2013-11-15: 10 via INTRAVENOUS

## 2013-11-15 MED ORDER — SODIUM CHLORIDE 0.9 % IJ SOLN
INTRAMUSCULAR | Status: AC
  Administered 2013-11-15: 10 mL via INTRAVENOUS
  Filled 2013-11-15: qty 10

## 2013-11-15 MED ORDER — TECHNETIUM TC 99M SESTAMIBI GENERIC - CARDIOLITE
30.0000 | Freq: Once | INTRAVENOUS | Status: AC | PRN
  Administered 2013-11-15: 30 via INTRAVENOUS

## 2013-11-15 MED ORDER — REGADENOSON 0.4 MG/5ML IV SOLN
INTRAVENOUS | Status: AC
  Filled 2013-11-15: qty 5

## 2013-11-15 NOTE — Progress Notes (Signed)
Stress Lab Nurses Notes - [place] 11/15/2013 Reason for doing test: CAD and weakness Type of test: Stress Cardiolite Nurse performing test: Gerrit Halls, RN Nuclear Medicine Tech: Melburn Hake Echo Tech: Not Applicable MD performing test: Branch/K.Purcell Nails NP Family MD: Willey Blade Test explained and consent signed: Yes.   IV started: Saline lock flushed, No redness or edema and Saline lock started in radiology Symptoms: Fatigue Treatment/Intervention: None Reason test stopped: fatigue After recovery IV was: Discontinued via X-ray tech and No redness or edema Patient to return to Nuc. Med at : 11:15 Patient discharged: Home Patient's Condition upon discharge was: stable Comments: During test peak BP 182/80 & HR 139 .  Recovery BP 132/68 & HR 87 .  Symptoms resolved in recovery. [name] T

## 2013-11-18 ENCOUNTER — Other Ambulatory Visit: Payer: Self-pay

## 2013-11-18 DIAGNOSIS — I2510 Atherosclerotic heart disease of native coronary artery without angina pectoris: Secondary | ICD-10-CM

## 2013-11-20 ENCOUNTER — Ambulatory Visit (HOSPITAL_COMMUNITY)
Admission: RE | Admit: 2013-11-20 | Discharge: 2013-11-20 | Disposition: A | Discharge status: Home / Self Care | Payer: Medicare Other | Source: Ambulatory Visit | Attending: Cardiology | Admitting: Cardiology

## 2013-11-20 DIAGNOSIS — I2510 Atherosclerotic heart disease of native coronary artery without angina pectoris: Secondary | ICD-10-CM | POA: Insufficient documentation

## 2013-11-20 DIAGNOSIS — I252 Old myocardial infarction: Secondary | ICD-10-CM | POA: Diagnosis not present

## 2013-11-20 DIAGNOSIS — E785 Hyperlipidemia, unspecified: Secondary | ICD-10-CM | POA: Diagnosis not present

## 2013-11-20 DIAGNOSIS — I10 Essential (primary) hypertension: Secondary | ICD-10-CM | POA: Diagnosis not present

## 2013-11-20 DIAGNOSIS — I4891 Unspecified atrial fibrillation: Secondary | ICD-10-CM | POA: Diagnosis not present

## 2013-11-20 DIAGNOSIS — I081 Rheumatic disorders of both mitral and tricuspid valves: Secondary | ICD-10-CM | POA: Diagnosis not present

## 2013-11-20 DIAGNOSIS — Z87891 Personal history of nicotine dependence: Secondary | ICD-10-CM | POA: Insufficient documentation

## 2013-11-20 DIAGNOSIS — I059 Rheumatic mitral valve disease, unspecified: Secondary | ICD-10-CM | POA: Diagnosis not present

## 2013-11-20 NOTE — Progress Notes (Signed)
  Echocardiogram 2D Echocardiogram has been performed.  [place], [place] 11/20/2013, 11:08 AM

## 2013-12-09 DIAGNOSIS — Z79899 Other long term (current) drug therapy: Secondary | ICD-10-CM | POA: Diagnosis not present

## 2013-12-17 DIAGNOSIS — E871 Hypo-osmolality and hyponatremia: Secondary | ICD-10-CM | POA: Diagnosis not present

## 2013-12-17 DIAGNOSIS — I5022 Chronic systolic (congestive) heart failure: Secondary | ICD-10-CM | POA: Diagnosis not present

## 2014-03-17 DIAGNOSIS — H10011 Acute follicular conjunctivitis, right eye: Secondary | ICD-10-CM | POA: Diagnosis not present

## 2014-03-20 DIAGNOSIS — M9261 Juvenile osteochondrosis of tarsus, right ankle: Secondary | ICD-10-CM | POA: Diagnosis not present

## 2014-03-20 DIAGNOSIS — M79671 Pain in right foot: Secondary | ICD-10-CM | POA: Diagnosis not present

## 2014-04-10 DIAGNOSIS — M79671 Pain in right foot: Secondary | ICD-10-CM | POA: Diagnosis not present

## 2014-04-10 DIAGNOSIS — M7661 Achilles tendinitis, right leg: Secondary | ICD-10-CM | POA: Diagnosis not present

## 2014-04-18 DIAGNOSIS — Z79899 Other long term (current) drug therapy: Secondary | ICD-10-CM | POA: Diagnosis not present

## 2014-04-18 DIAGNOSIS — E871 Hypo-osmolality and hyponatremia: Secondary | ICD-10-CM | POA: Diagnosis not present

## 2014-04-18 DIAGNOSIS — I2510 Atherosclerotic heart disease of native coronary artery without angina pectoris: Secondary | ICD-10-CM | POA: Diagnosis not present

## 2014-04-18 DIAGNOSIS — I509 Heart failure, unspecified: Secondary | ICD-10-CM | POA: Diagnosis not present

## 2014-04-25 DIAGNOSIS — E871 Hypo-osmolality and hyponatremia: Secondary | ICD-10-CM | POA: Diagnosis not present

## 2014-04-25 DIAGNOSIS — I5022 Chronic systolic (congestive) heart failure: Secondary | ICD-10-CM | POA: Diagnosis not present

## 2014-04-25 DIAGNOSIS — E785 Hyperlipidemia, unspecified: Secondary | ICD-10-CM | POA: Diagnosis not present

## 2014-04-25 DIAGNOSIS — I2510 Atherosclerotic heart disease of native coronary artery without angina pectoris: Secondary | ICD-10-CM | POA: Diagnosis not present

## 2014-08-20 DIAGNOSIS — I2510 Atherosclerotic heart disease of native coronary artery without angina pectoris: Secondary | ICD-10-CM | POA: Diagnosis not present

## 2014-08-20 DIAGNOSIS — Z79899 Other long term (current) drug therapy: Secondary | ICD-10-CM | POA: Diagnosis not present

## 2014-08-29 DIAGNOSIS — E871 Hypo-osmolality and hyponatremia: Secondary | ICD-10-CM | POA: Diagnosis not present

## 2014-08-29 DIAGNOSIS — Z6831 Body mass index (BMI) 31.0-31.9, adult: Secondary | ICD-10-CM | POA: Diagnosis not present

## 2014-08-29 DIAGNOSIS — F319 Bipolar disorder, unspecified: Secondary | ICD-10-CM | POA: Diagnosis not present

## 2014-08-29 DIAGNOSIS — I5022 Chronic systolic (congestive) heart failure: Secondary | ICD-10-CM | POA: Diagnosis not present

## 2014-09-05 ENCOUNTER — Other Ambulatory Visit: Payer: Self-pay | Admitting: Cardiology

## 2014-10-16 ENCOUNTER — Ambulatory Visit (INDEPENDENT_AMBULATORY_CARE_PROVIDER_SITE_OTHER): Payer: Medicare Other | Admitting: Cardiology

## 2014-10-16 ENCOUNTER — Encounter: Payer: Self-pay | Admitting: Cardiology

## 2014-10-16 VITALS — BP 100/60 | HR 79 | Ht 71.0 in | Wt 224.0 lb

## 2014-10-16 DIAGNOSIS — E782 Mixed hyperlipidemia: Secondary | ICD-10-CM

## 2014-10-16 DIAGNOSIS — I2510 Atherosclerotic heart disease of native coronary artery without angina pectoris: Secondary | ICD-10-CM | POA: Diagnosis not present

## 2014-10-16 DIAGNOSIS — I10 Essential (primary) hypertension: Secondary | ICD-10-CM

## 2014-10-16 NOTE — Progress Notes (Signed)
Cardiology Office Note  Date: 10/16/2014   ID: SUSAN ARANA, DOB [birth year], MRN [id number]  PCP: [name], MD  Primary Cardiologist: Rozann Lesches, MD   Chief Complaint  Patient presents with  . Coronary Artery Disease  . Atrial Fibrillation    History of Present Illness: Grant Stevens is a 74 y.o. male last seen in October 2015. He presents for a routine follow-up visit. Since last encounter he has had no significant angina symptoms by report. 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 Also continues to exercise at [facility] here in [place].  We reviewed his medications which are outlined below. Prior described dizziness has completely resolved after reducing dose of lisinopril. Blood pressure is stable today, low normal range.  Ischemic workup from last year showed evidence of large inferior infarct scar without active ischemia, LVEF was approximately 40% by echocardiography. We have continued medical therapy which has been his preference with stable symptoms.  He does not endorse any palpitations, sudden dizziness or syncope. Follow-up ECG today shows sinus rhythm with prolonged PR interval and evidence of previous inferolateral infarct.  He continues to follow with Dr. Willey Blade, lab work from earlier this year reviewed showing good LDL control on Lipitor.   Past Medical History  Diagnosis Date  . Coronary atherosclerosis of native coronary artery     Multivessel s/p CABG in 06/2001 post-IMI, LVEF 35% up to 50% postoperatively;  . Essential hypertension, benign   . Hyperlipidemia   . Sleep apnea   . History of bipolar disorder   . Allergic rhinitis   . Asthma   . Erectile dysfunction   . Atrial fibrillation ([place])     Remote history - WARCEF  . Myocardial infarction [facility])     Past Surgical History  Procedure Laterality Date  . Coronary artery bypass graft  08/2000    Dr. Servando Snare - LIMA to LAD, SVG to diagonal, SVG to PDA  .  Tonsillectomy    . Circumcision  10/2003  . Colonoscopy N/A 07/11/2012    Procedure: COLONOSCOPY;  Surgeon: Rogene Houston, MD;  [facility];  Service: Endoscopy;  Laterality: N/A;  830-moved to 930 Ann to notify pt    Current Outpatient Prescriptions  Medication Sig Dispense Refill  . aspirin (BAYER LOW STRENGTH) 81 MG EC tablet Take 81 mg by mouth daily.     Marland Kitchen atorvastatin (LIPITOR) 80 MG tablet Take 80 mg by mouth at bedtime.      . carbamazepine (TEGRETOL) 200 MG tablet Take 200 mg by mouth 4 (four) times daily. Take 2 tablets in the morning and 2 at bedtime.    Marland Kitchen lisinopril (PRINIVIL,ZESTRIL) 5 MG tablet TAKE ONE TABLET BY MOUTH ONCE DAILY. 90 tablet 1  . risperiDONE (RISPERDAL) 1 MG tablet Take 0.5-1.5 mg by mouth 2 (two) times daily. Takes half tablet in the morning and 1.5 tablet at night    . furosemide (LASIX) 40 MG tablet Take 40 mg by mouth daily.      No current facility-administered medications for this visit.    Allergies:  Review of patient's allergies indicates no known allergies.   Social History: The patient  reports that he quit smoking about 43 years ago. His smoking use included Cigarettes. He has a 15 pack-year smoking history. He does not have any smokeless tobacco history on file. He reports that he does not drink alcohol or use illicit drugs.   ROS:  Please see the history of  present illness. Otherwise, complete review of systems is positive for none.  All other systems are reviewed and negative.   Physical Exam: VS:  BP 100/60 mmHg  Pulse 79  Ht 5\' 11"  (1.803 m)  Wt 224 lb (101.606 kg)  BMI 31.26 kg/m2  SpO2 96%, BMI Body mass index is 31.26 kg/(m^2).  Wt Readings from Last 3 Encounters:  10/16/14 224 lb (101.606 kg)  11/06/13 227 lb (102.967 kg)  10/31/12 232 lb (105.235 kg)     Appears comfortable at rest.  HEENT: Conjunctiva and lids normal, oropharynx clear.  Neck: Supple, no elevated JVP or carotid bruits, no thyromegaly.  Lungs:  Clear to auscultation, nonlabored breathing at rest.  Cardiac: Regular rate and rhythm, no S3 or significant systolic murmur, no pericardial rub.  Abdomen: Soft, nontender, bowel sounds present, no guarding or rebound.  Extremities: No pitting edema, distal pulses 2+.  Skin: Warm and dry.  Musculoskeletal: No kyphosis.  Neuropsychiatric: Alert and oriented x3, affect grossly appropriate.   ECG: ECG is ordered today.  Recent Labwork: April 2016: BUN 12, creatinine 0.9, potassium 4.4, AST 21, ALT 18, hemoglobin 14.1, platelets 154, cholesterol 137, triglycerides 66, HDL 53, LDL 71  Other Studies Reviewed Today:  Exercise Cardiolite 11/15/2013: FINDINGS: Exercise stress  Baseline EKG showed sinus rhythm with right bundle branch block, left anterior fascicular block, and 1st degree AV block (trifascular block), inferior and lateral precrodial Q waves. The patient was exercised according to the Bruce protocol for 7 min 42 seconds achieving a work level of 10.1 Mets. The resting heart rate is 69 beats per min rose to a maximal rate of 141 beats per min, representing 95% of the maximal age predicted heart rate. The resting blood pressure of 128/70 increased to a maximum of 182/80. The test was stopped due to fatigue, the patient did not experience any chest pain. Stress EKG showed no specific ischemic changes and no significant arrhythmias though interpretation is somewhat limited by heavy artifact  Perfusion: There is a large fixed severe defect of the inferior, inferolateral, inferoseptal, inferoapical walls. There are no other myocardial perfusion defects.  Wall Motion: The inferior wall is hypokinetic. The left ventricle is enlarged. There is no transient ischemic dilation.  Left Ventricular Ejection Fraction: 32 %  End diastolic volume 209 ml  End systolic volume 470 ml  IMPRESSION: 1. Large fixed scar of the entire inferior wall, there is no peri-infarct  ischemia.  2. There is inferior wall hypokinesis. The left ventricle is dilated by volume.  3. Left ventricular ejection fraction 32%  4. Negative stress EKG for ischemia. Duke treadmill score of 7 consistent with low risk for major cardiac events. Excellent functional capacity (140% of predicted based on age and gender)  5. Overall high risk study for major cardiac events due to low ejection fraction, imaging shows no current myocardium at jeopardy. Consider correlating LVEF by echo. Exercise portion of test and Duke treadmill score suggest lower risk.  Echocardiogram 11/20/2013: Study Conclusions  - Left ventricle: The cavity size was at the upper limits of normal. Systolic function was moderately reduced. The estimated ejection fraction was in the range of 35% to 40%. Speckle tracking biplane LVEF 48%, GLS -14.6%. There is akinesis and scarring of the inferolateral and inferior myocardium. Doppler parameters are consistent with abnormal left ventricular relaxation (grade 1 diastolic dysfunction). - Aortic valve: Mildly calcified annulus. Trileaflet; mildly calcified leaflets. There was no significant regurgitation. - Mitral valve: Calcified annulus. Mildly thickened leaflets . There  was mild regurgitation. - Left atrium: The atrium was moderately dilated. - Right atrium: The atrium was mildly dilated. Central venous pressure (est): 3 mm Hg. - Atrial septum: No defect or patent foramen ovale was identified. - Tricuspid valve: There was trivial regurgitation. - Pulmonary arteries: PA peak pressure: 30 mm Hg (S). - Pericardium, extracardiac: There was no pericardial effusion.  Impressions:  - Upper normal LV chamber size with LVEF 35-40% (perhaps better by speckle tracking LVEF), wall motion abnormalities consistent with ischemic cardiomyopathy. Grade 1 diastolic dysfunction. MAC with mild mitral regurgitation. Moderate left atrial  enlargement. Trivial tricuspid regurgitation with PASP 30 mmHg.   ASSESSMENT AND PLAN:  1. Multivessel CAD status post CABG in 2003 with previous inferolateral infarct. He is doing quite well without recurring angina, exercising regularly on medical therapy which has been somewhat limited by low blood pressure. Follow-up testing from last year's reviewed above area he has an ischemic cardiomyopathy with large inferolateral infarct scar but no active ischemia, LVEF 35-40%. He continues to prefer conservative follow-up on medical therapy with stable symptoms. No changes were made today.  2. Hyperlipidemia, on Lipitor with LDL 71.  3. History of essential hypertension, blood pressure low normal today. No adjustments made in regimen.  Current medicines were reviewed at length with the patient today.   Orders Placed This Encounter  Procedures  . EKG 12-Lead    Disposition: FU with me in 1 year.   Signed, [name], MD, [facility] 10/16/2014 1:10 PM    [place] [facility] [address], [place], [place] [address] Phone: [phone]; Fax: [phone]

## 2014-10-16 NOTE — Patient Instructions (Signed)
Your physician wants you to follow-up in: 1 year with Dr McDowell You will receive a reminder letter in the mail two months in advance. If you don't receive a letter, please call our office to schedule the follow-up appointment.   Your physician recommends that you continue on your current medications as directed. Please refer to the Current Medication list given to you today.     Thank you for choosing [facility] !        

## 2014-10-19 ENCOUNTER — Observation Stay (HOSPITAL_COMMUNITY)
Admission: EM | Admit: 2014-10-19 | Discharge: 2014-10-21 | Disposition: A | Discharge status: Home / Self Care | Payer: Medicare Other | Attending: Internal Medicine | Admitting: Internal Medicine

## 2014-10-19 ENCOUNTER — Inpatient Hospital Stay (HOSPITAL_COMMUNITY): Payer: Medicare Other

## 2014-10-19 ENCOUNTER — Emergency Department (HOSPITAL_COMMUNITY): Payer: Medicare Other

## 2014-10-19 ENCOUNTER — Encounter (HOSPITAL_COMMUNITY): Payer: Self-pay | Admitting: Emergency Medicine

## 2014-10-19 DIAGNOSIS — H538 Other visual disturbances: Secondary | ICD-10-CM | POA: Insufficient documentation

## 2014-10-19 DIAGNOSIS — Z951 Presence of aortocoronary bypass graft: Secondary | ICD-10-CM | POA: Diagnosis not present

## 2014-10-19 DIAGNOSIS — E785 Hyperlipidemia, unspecified: Secondary | ICD-10-CM | POA: Insufficient documentation

## 2014-10-19 DIAGNOSIS — G4730 Sleep apnea, unspecified: Secondary | ICD-10-CM | POA: Diagnosis not present

## 2014-10-19 DIAGNOSIS — N529 Male erectile dysfunction, unspecified: Secondary | ICD-10-CM | POA: Diagnosis not present

## 2014-10-19 DIAGNOSIS — Z7982 Long term (current) use of aspirin: Secondary | ICD-10-CM | POA: Insufficient documentation

## 2014-10-19 DIAGNOSIS — I2510 Atherosclerotic heart disease of native coronary artery without angina pectoris: Secondary | ICD-10-CM | POA: Diagnosis not present

## 2014-10-19 DIAGNOSIS — Z87891 Personal history of nicotine dependence: Secondary | ICD-10-CM | POA: Diagnosis not present

## 2014-10-19 DIAGNOSIS — J45909 Unspecified asthma, uncomplicated: Secondary | ICD-10-CM | POA: Diagnosis not present

## 2014-10-19 DIAGNOSIS — I252 Old myocardial infarction: Secondary | ICD-10-CM | POA: Insufficient documentation

## 2014-10-19 DIAGNOSIS — I10 Essential (primary) hypertension: Secondary | ICD-10-CM | POA: Diagnosis present

## 2014-10-19 DIAGNOSIS — Z8679 Personal history of other diseases of the circulatory system: Secondary | ICD-10-CM

## 2014-10-19 DIAGNOSIS — I639 Cerebral infarction, unspecified: Secondary | ICD-10-CM | POA: Diagnosis not present

## 2014-10-19 DIAGNOSIS — I4891 Unspecified atrial fibrillation: Secondary | ICD-10-CM | POA: Diagnosis not present

## 2014-10-19 DIAGNOSIS — R42 Dizziness and giddiness: Secondary | ICD-10-CM

## 2014-10-19 DIAGNOSIS — I959 Hypotension, unspecified: Secondary | ICD-10-CM | POA: Diagnosis present

## 2014-10-19 DIAGNOSIS — Z79899 Other long term (current) drug therapy: Secondary | ICD-10-CM | POA: Diagnosis not present

## 2014-10-19 LAB — COMPREHENSIVE METABOLIC PANEL
ALBUMIN: 4 g/dL (ref 3.5–5.0)
ALK PHOS: 93 U/L (ref 38–126)
ALT: 20 U/L (ref 17–63)
AST: 24 U/L (ref 15–41)
Anion gap: 8 (ref 5–15)
BUN: 19 mg/dL (ref 6–20)
CALCIUM: 8.6 mg/dL — AB (ref 8.9–10.3)
CO2: 26 mmol/L (ref 22–32)
CREATININE: 1.42 mg/dL — AB (ref 0.61–1.24)
Chloride: 99 mmol/L — ABNORMAL LOW (ref 101–111)
GFR calc Af Amer: 55 mL/min — ABNORMAL LOW (ref >60)
GFR calc non Af Amer: 47 mL/min — ABNORMAL LOW (ref >60)
GLUCOSE: 165 mg/dL — AB (ref 65–99)
Potassium: 4.1 mmol/L (ref 3.5–5.1)
SODIUM: 133 mmol/L — AB (ref 135–145)
Total Bilirubin: 0.5 mg/dL (ref 0.3–1.2)
Total Protein: 6.4 g/dL — ABNORMAL LOW (ref 6.5–8.1)

## 2014-10-19 LAB — CBC
HCT: 39.8 % (ref 39.0–52.0)
HEMOGLOBIN: 13.7 g/dL (ref 13.0–17.0)
MCH: 31.4 pg (ref 26.0–34.0)
MCHC: 34.4 g/dL (ref 30.0–36.0)
MCV: 91.3 fL (ref 78.0–100.0)
PLATELETS: 142 10*3/uL — AB (ref 150–400)
RBC: 4.36 MIL/uL (ref 4.22–5.81)
RDW: 12.6 % (ref 11.5–15.5)
WBC: 10.8 10*3/uL — AB (ref 4.0–10.5)

## 2014-10-19 LAB — DIFFERENTIAL
Basophils Absolute: 0 10*3/uL (ref 0.0–0.1)
Basophils Relative: 0 %
Eosinophils Absolute: 0.2 10*3/uL (ref 0.0–0.7)
Eosinophils Relative: 2 %
LYMPHS ABS: 1.1 10*3/uL (ref 0.7–4.0)
LYMPHS PCT: 10 %
Monocytes Absolute: 0.6 10*3/uL (ref 0.1–1.0)
Monocytes Relative: 6 %
NEUTROS ABS: 8.9 10*3/uL — AB (ref 1.7–7.7)
NEUTROS PCT: 82 %

## 2014-10-19 LAB — I-STAT CHEM 8, ED
BUN: 18 mg/dL (ref 6–20)
CALCIUM ION: 1.13 mmol/L (ref 1.13–1.30)
Chloride: 96 mmol/L — ABNORMAL LOW (ref 101–111)
Creatinine, Ser: 1.4 mg/dL — ABNORMAL HIGH (ref 0.61–1.24)
Glucose, Bld: 166 mg/dL — ABNORMAL HIGH (ref 65–99)
HCT: 43 % (ref 39.0–52.0)
Hemoglobin: 14.6 g/dL (ref 13.0–17.0)
Potassium: 4.1 mmol/L (ref 3.5–5.1)
SODIUM: 133 mmol/L — AB (ref 135–145)
TCO2: 24 mmol/L (ref 0–100)

## 2014-10-19 LAB — CBG MONITORING, ED: GLUCOSE-CAPILLARY: 152 mg/dL — AB (ref 65–99)

## 2014-10-19 LAB — PROTIME-INR
INR: 1.1 (ref 0.00–1.49)
PROTHROMBIN TIME: 14.4 s (ref 11.6–15.2)

## 2014-10-19 LAB — I-STAT TROPONIN, ED: Troponin i, poc: 0.01 ng/mL (ref 0.00–0.08)

## 2014-10-19 LAB — ETHANOL: Alcohol, Ethyl (B): <5 mg/dL (ref <5)

## 2014-10-19 LAB — APTT: aPTT: 25 seconds (ref 24–37)

## 2014-10-19 MED ORDER — SODIUM CHLORIDE 0.9 % IV SOLN: Freq: Once | INTRAVENOUS | Status: DC

## 2014-10-19 MED ORDER — FUROSEMIDE 20 MG PO TABS
20.0000 mg | ORAL_TABLET | Freq: Every day | ORAL | Status: DC
  Administered 2014-10-20 – 2014-10-21 (×2): 20 mg via ORAL
  Filled 2014-10-19 (×3): qty 1

## 2014-10-19 MED ORDER — ASPIRIN EC 81 MG PO TBEC
81.0000 mg | DELAYED_RELEASE_TABLET | Freq: Every day | ORAL | Status: DC
Start: 2014-10-19 — End: 2014-10-21
  Administered 2014-10-20 – 2014-10-21 (×2): 81 mg via ORAL
  Filled 2014-10-19 (×3): qty 1

## 2014-10-19 MED ORDER — SODIUM CHLORIDE 0.9 % IV SOLN
INTRAVENOUS | Status: DC
  Administered 2014-10-19: 22:00:00 via INTRAVENOUS

## 2014-10-19 MED ORDER — ONDANSETRON HCL 4 MG PO TABS: 4.0000 mg | ORAL_TABLET | Freq: Four times a day (QID) | ORAL | Status: DC | PRN

## 2014-10-19 MED ORDER — LISINOPRIL 5 MG PO TABS
5.0000 mg | ORAL_TABLET | Freq: Every day | ORAL | Status: DC
Start: 2014-10-19 — End: 2014-10-21
  Administered 2014-10-20 – 2014-10-21 (×2): 5 mg via ORAL
  Filled 2014-10-19 (×3): qty 1

## 2014-10-19 MED ORDER — ATORVASTATIN CALCIUM 40 MG PO TABS
80.0000 mg | ORAL_TABLET | Freq: Every day | ORAL | Status: DC
  Administered 2014-10-19 – 2014-10-20 (×2): 80 mg via ORAL
  Filled 2014-10-19 (×2): qty 2

## 2014-10-19 MED ORDER — ONDANSETRON HCL 4 MG/2ML IJ SOLN: 4.0000 mg | Freq: Four times a day (QID) | INTRAMUSCULAR | Status: DC | PRN

## 2014-10-19 MED ORDER — ENOXAPARIN SODIUM 30 MG/0.3ML ~~LOC~~ SOLN
30.0000 mg | SUBCUTANEOUS | Status: DC
  Administered 2014-10-19: 30 mg via SUBCUTANEOUS
  Filled 2014-10-19: qty 0.3

## 2014-10-19 MED ORDER — SODIUM CHLORIDE 0.9 % IJ SOLN
3.0000 mL | Freq: Two times a day (BID) | INTRAMUSCULAR | Status: DC
  Administered 2014-10-19 – 2014-10-20 (×3): 3 mL via INTRAVENOUS

## 2014-10-19 MED ORDER — SODIUM CHLORIDE 0.9 % IV BOLUS (SEPSIS)
500.0000 mL | Freq: Once | INTRAVENOUS | Status: AC
  Administered 2014-10-19: 500 mL via INTRAVENOUS

## 2014-10-19 MED ORDER — RISPERIDONE 0.5 MG PO TABS
0.5000 mg | ORAL_TABLET | Freq: Two times a day (BID) | ORAL | Status: DC
  Administered 2014-10-19: 0.75 mg via ORAL
  Administered 2014-10-20: 0.5 mg via ORAL
  Filled 2014-10-19: qty 2
  Filled 2014-10-19: qty 1
  Filled 2014-10-19: qty 2

## 2014-10-19 MED ORDER — IOHEXOL 350 MG/ML SOLN
100.0000 mL | Freq: Once | INTRAVENOUS | Status: AC | PRN
  Administered 2014-10-19: 100 mL via INTRAVENOUS

## 2014-10-19 MED ORDER — CARBAMAZEPINE 200 MG PO TABS
200.0000 mg | ORAL_TABLET | Freq: Four times a day (QID) | ORAL | Status: DC
  Administered 2014-10-19 – 2014-10-21 (×6): 200 mg via ORAL
  Filled 2014-10-19 (×6): qty 1

## 2014-10-19 NOTE — ED Notes (Signed)
Pt states that he was on the way back from lunch and got very dizzy and had some vision changes at 1515.  States that it has resolved at this time.

## 2014-10-19 NOTE — ED Notes (Signed)
Pt sister and niece went home. Family stated would be back but left contact information for updates. Pt sister Charlann Boxer, [phone]; [phone]. Pt niece, Lattie Haw, [place].

## 2014-10-19 NOTE — ED Provider Notes (Signed)
CSN: [id number]     Arrival date & time 10/19/14  1543 History   First MD Initiated Contact with Patient 10/19/14 1552     Chief Complaint  Patient presents with  . Code Stroke  . Hypotension    @EDPCLEARED @ (Consider location/radiation/quality/duration/timing/severity/associated sxs/prior Treatment) HPI Comments: 74 y.o. Male with history of CAD, CABG, HTN, hyperlipidemia, sleep apnea, atrial fibrillation presents for dizziness and blurred vision.  The patient reports that 45 minutes prior to presentation to the ER he was driving when he suddenly had the onset of dizziness where he felt off balance and that things were moving weirdly as well as blurriness to the vision in both eyes.  These symptoms have persisted but somewhat improved.  He denies any episodes like this in the past.  Denies headache, chest pain, palpitations, shortness of breath.   Past Medical History  Diagnosis Date  . Coronary atherosclerosis of native coronary artery     Multivessel s/p CABG in 06/2001 post-IMI, LVEF 35% up to 50% postoperatively;  . Essential hypertension, benign   . Hyperlipidemia   . Sleep apnea   . History of bipolar disorder   . Allergic rhinitis   . Asthma   . Erectile dysfunction   . Atrial fibrillation ([place])     Remote history - WARCEF  . Myocardial infarction [facility])    Past Surgical History  Procedure Laterality Date  . Coronary artery bypass graft  08/2000    Dr. Servando Snare - LIMA to LAD, SVG to diagonal, SVG to PDA  . Tonsillectomy    . Circumcision  10/2003  . Colonoscopy N/A 07/11/2012    Procedure: COLONOSCOPY;  Surgeon: Rogene Houston, MD;  Location: [facility];  Service: Endoscopy;  Laterality: N/A;  830-moved to 52 Ann to notify pt   Family History  Problem Relation Age of Onset  . Asthma Mother   . Heart attack Father 38  . Diabetes Brother    Social History  Substance Use Topics  . Smoking status: Former Smoker -- 1.00 packs/day for 15 years    Types: Cigarettes     Quit date: 01/11/1971  . Smokeless tobacco: None  . Alcohol Use: No    Review of Systems  Constitutional: Negative for fever, chills, appetite change and fatigue.  HENT: Negative for congestion, postnasal drip and rhinorrhea.   Eyes: Positive for visual disturbance (blurriness). Negative for pain and redness.  Respiratory: Negative for cough, chest tightness and shortness of breath.   Cardiovascular: Negative for chest pain and palpitations.  Gastrointestinal: Negative for nausea, vomiting, abdominal pain and diarrhea.  Genitourinary: Negative for dysuria, urgency and hematuria.  Musculoskeletal: Negative for myalgias and back pain.  Skin: Negative for rash.  Neurological: Positive for dizziness and speech difficulty. Negative for syncope, weakness, light-headedness, numbness and headaches.  Hematological: Does not bruise/bleed easily.      Allergies  Review of patient's allergies indicates no known allergies.  Home Medications   Prior to Admission medications   Medication Sig Start Date End Date Taking? Authorizing Provider  aspirin EC 81 MG tablet Take 81 mg by mouth daily.   Yes Historical Provider, MD  atorvastatin (LIPITOR) 80 MG tablet Take 80 mg by mouth at bedtime.     Yes Historical Provider, MD  carbamazepine (TEGRETOL) 200 MG tablet Take 400 mg by mouth 2 (two) times daily.    Yes Historical Provider, MD  furosemide (LASIX) 40 MG tablet Take 20 mg by mouth daily.  08/11/14  Yes Historical Provider,  MD  lisinopril (PRINIVIL,ZESTRIL) 5 MG tablet TAKE ONE TABLET BY MOUTH ONCE DAILY. 09/05/14  Yes [name], MD  risperiDONE (RISPERDAL) 1 MG tablet Take 0.5-1.5 mg by mouth 2 (two) times daily. Takes half tablet in the morning and 1.5 tablet at night   Yes Historical Provider, MD   BP 135/90 mmHg  Pulse 106  Temp(Src) 98 F (36.7 C) (Oral)  Resp 18  Ht 5\' 11"  (1.803 m)  Wt 226 lb 1.6 oz (102.558 kg)  BMI 31.55 kg/m2  SpO2 98% Physical Exam  Constitutional: He  is oriented to person, place, and time. He appears well-developed and well-nourished. No distress.  HENT:  Head: Normocephalic and atraumatic.  Right Ear: External ear normal.  Left Ear: External ear normal.  Mouth/Throat: Oropharynx is clear and moist. No oropharyngeal exudate.  Eyes: EOM are normal. Pupils are equal, round, and reactive to light.  Neck: Normal range of motion. Neck supple.  Cardiovascular: Normal rate, regular rhythm and intact distal pulses.   Pulmonary/Chest: Effort normal. No respiratory distress. He has no wheezes. He has no rales.  Abdominal: Soft. He exhibits no distension. There is no tenderness.  Musculoskeletal: He exhibits no edema.  Neurological: He is alert and oriented to person, place, and time.  Skin: Skin is warm and dry. No rash noted. He is not diaphoretic.  Vitals reviewed.   ED Course  Procedures (including critical care time) Labs Review Labs Reviewed  CBC - Abnormal; Notable for the following:    WBC 10.8 (*)    Platelets 142 (*)    All other components within normal limits  DIFFERENTIAL - Abnormal; Notable for the following:    Neutro Abs 8.9 (*)    All other components within normal limits  COMPREHENSIVE METABOLIC PANEL - Abnormal; Notable for the following:    Sodium 133 (*)    Chloride 99 (*)    Glucose, Bld 165 (*)    Creatinine, Ser 1.42 (*)    Calcium 8.6 (*)    Total Protein 6.4 (*)    GFR calc non Af Amer 47 (*)    GFR calc Af Amer 55 (*)    All other components within normal limits  CBG MONITORING, ED - Abnormal; Notable for the following:    Glucose-Capillary 152 (*)    All other components within normal limits  I-STAT CHEM 8, ED - Abnormal; Notable for the following:    Sodium 133 (*)    Chloride 96 (*)    Creatinine, Ser 1.40 (*)    Glucose, Bld 166 (*)    All other components within normal limits  ETHANOL  PROTIME-INR  APTT  URINE RAPID DRUG SCREEN, HOSP PERFORMED  URINALYSIS, ROUTINE W REFLEX MICROSCOPIC (NOT  AT [facility])  COMPREHENSIVE METABOLIC PANEL  CBC  I-STAT TROPOININ, ED    Imaging Review Ct Angio Head W/cm &/or Wo Cm  10/19/2014   CLINICAL DATA:  Sudden onset of dizziness and blurred vision beginning at 3 o'clock today. Episode lasted 45 minutes. Symptoms have since resolved.  EXAM: CT ANGIOGRAPHY HEAD AND NECK  TECHNIQUE: Multidetector CT imaging of the head and neck was performed using the standard protocol during bolus administration of intravenous contrast. Multiplanar CT image reconstructions and MIPs were obtained to evaluate the vascular anatomy. Carotid stenosis measurements (when applicable) are obtained utilizing NASCET criteria, using the distal internal carotid diameter as the denominator.  CONTRAST:  159mL OMNIPAQUE IOHEXOL 350 MG/ML SOLN  COMPARISON:  CT head without contrast from the  same day.  FINDINGS: CT HEAD  Brain: Source images demonstrate no acute infarct, hemorrhage, or mass lesion. The basal ganglia are intact. The insular ribbon is normal. The cerebral cortex is unremarkable.  Calvarium and skull base: Negative.  Paranasal sinuses: A polyp or mucous retention cyst is evident along the inferior aspect of the left maxillary sinus. Paranasal sinuses and mastoid air cells are otherwise clear.  Orbits: Within normal limits  CTA NECK  Aortic arch: There is a common origin of the left common carotid artery in the innominate artery. Atherosclerotic calcifications are present at the aortic arch and origins of the left subclavian artery and left common carotid artery without significant stenosis.  Right carotid system: The right common carotid artery there is within normal limits. Atherosclerotic calcifications are present at the carotid bifurcation without significant stenosis. There is moderate tortuosity within the mid cervical right ICA without significant stenosis.  Left carotid system: The left common carotid artery is within normal limits. Atherosclerotic calcifications are present at  the carotid bifurcation without significant stenosis relative to the more distal vessel. The cervical left ICA is normal.  Vertebral arteries:The vertebral arteries originate from the subclavian arteries bilaterally without significant stenosis. There is no significant vertebral artery stenosis in the neck. The left vertebral artery is slightly dominant to the right.  Skeleton: Bone windows demonstrate mild focal degenerative change at C5-6, worse on the left. No focal lytic or blastic lesions are present. The mandible is intact and located.  Other neck: Soft tissues the neck are unremarkable. Thyroid is within normal limits. No focal mucosal or submucosal lesions are present. Salivary glands are intact.7 the lung apices are clear.  CTA HEAD  Anterior circulation: Atherosclerotic calcifications are present within the cavernous internal carotid arteries bilaterally. The ICA termini are within normal limits. The A1 and M1 segments are normal. The anterior communicating artery is patent. The MCA bifurcations are within normal limits. There is moderate attenuation of MCA branch vessels bilaterally.  Posterior circulation: Atherosclerotic calcifications are noted at the dural margin of the vertebral arteries bilaterally. There is mild irregularity just be on the vertebrobasilar junction. The left PICA origin is visualized and normal. The right AICA is dominant. Both posterior cerebral arteries originate from the basilar tip. There is some attenuation of distal PCA branch vessels.  Venous sinuses: The dural sinuses are patent. The straight sinus and deep cerebral veins are within normal limits. Cortical veins are unremarkable.  Anatomic variants: None  Delayed phase: No pathologic enhancement is present.  IMPRESSION: 1. Atherosclerotic calcifications at the carotid bifurcations bilaterally without significant focal stenosis relative to the more distal vessel. 2. Tortuosity of the mid cervical right ICA without significant  stenosis. 3. Atherosclerotic changes within the cavernous internal carotid arteries and at the dural margin of the vertebral arteries bilaterally without significant stenosis. 4. Mild to moderate distal small vessel disease without significant proximal stenosis, aneurysm, or branch vessel occlusion within the circle of Willis.   Electronically Signed   By: San Morelle M.D.   On: 10/19/2014 21:06   Ct Head Wo Contrast  10/19/2014   CLINICAL DATA:  Code stroke.  Acute dizziness.  EXAM: CT HEAD WITHOUT CONTRAST  TECHNIQUE: Contiguous axial images were obtained from the base of the skull through the vertex without intravenous contrast.  COMPARISON:  None.  FINDINGS: Mild cerebral atrophy is within normal limits for age. There is no evidence of acute cortical infarct, intracranial hemorrhage, mass, midline shift, or extra-axial fluid collection.  Orbits  are unremarkable. The visualized paranasal sinuses and mastoid air cells are clear. Mild atherosclerotic calcification is noted at the skullbase.  IMPRESSION: Unremarkable CT appearance of the brain for age.  These results were called by telephone at the time of interpretation on 10/19/2014 at 4:32 pm to Dr. Lonia Skinner , who verbally acknowledged these results.   Electronically Signed   By: Logan Bores M.D.   On: 10/19/2014 16:33   Ct Angio Neck W/cm &/or Wo/cm  10/19/2014   CLINICAL DATA:  Sudden onset of dizziness and blurred vision beginning at 3 o'clock today. Episode lasted 45 minutes. Symptoms have since resolved.  EXAM: CT ANGIOGRAPHY HEAD AND NECK  TECHNIQUE: Multidetector CT imaging of the head and neck was performed using the standard protocol during bolus administration of intravenous contrast. Multiplanar CT image reconstructions and MIPs were obtained to evaluate the vascular anatomy. Carotid stenosis measurements (when applicable) are obtained utilizing NASCET criteria, using the distal internal carotid diameter as the denominator.   CONTRAST:  174mL OMNIPAQUE IOHEXOL 350 MG/ML SOLN  COMPARISON:  CT head without contrast from the same day.  FINDINGS: CT HEAD  Brain: Source images demonstrate no acute infarct, hemorrhage, or mass lesion. The basal ganglia are intact. The insular ribbon is normal. The cerebral cortex is unremarkable.  Calvarium and skull base: Negative.  Paranasal sinuses: A polyp or mucous retention cyst is evident along the inferior aspect of the left maxillary sinus. Paranasal sinuses and mastoid air cells are otherwise clear.  Orbits: Within normal limits  CTA NECK  Aortic arch: There is a common origin of the left common carotid artery in the innominate artery. Atherosclerotic calcifications are present at the aortic arch and origins of the left subclavian artery and left common carotid artery without significant stenosis.  Right carotid system: The right common carotid artery there is within normal limits. Atherosclerotic calcifications are present at the carotid bifurcation without significant stenosis. There is moderate tortuosity within the mid cervical right ICA without significant stenosis.  Left carotid system: The left common carotid artery is within normal limits. Atherosclerotic calcifications are present at the carotid bifurcation without significant stenosis relative to the more distal vessel. The cervical left ICA is normal.  Vertebral arteries:The vertebral arteries originate from the subclavian arteries bilaterally without significant stenosis. There is no significant vertebral artery stenosis in the neck. The left vertebral artery is slightly dominant to the right.  Skeleton: Bone windows demonstrate mild focal degenerative change at C5-6, worse on the left. No focal lytic or blastic lesions are present. The mandible is intact and located.  Other neck: Soft tissues the neck are unremarkable. Thyroid is within normal limits. No focal mucosal or submucosal lesions are present. Salivary glands are intact.7 the  lung apices are clear.  CTA HEAD  Anterior circulation: Atherosclerotic calcifications are present within the cavernous internal carotid arteries bilaterally. The ICA termini are within normal limits. The A1 and M1 segments are normal. The anterior communicating artery is patent. The MCA bifurcations are within normal limits. There is moderate attenuation of MCA branch vessels bilaterally.  Posterior circulation: Atherosclerotic calcifications are noted at the dural margin of the vertebral arteries bilaterally. There is mild irregularity just be on the vertebrobasilar junction. The left PICA origin is visualized and normal. The right AICA is dominant. Both posterior cerebral arteries originate from the basilar tip. There is some attenuation of distal PCA branch vessels.  Venous sinuses: The dural sinuses are patent. The straight sinus and deep cerebral veins are  within normal limits. Cortical veins are unremarkable.  Anatomic variants: None  Delayed phase: No pathologic enhancement is present.  IMPRESSION: 1. Atherosclerotic calcifications at the carotid bifurcations bilaterally without significant focal stenosis relative to the more distal vessel. 2. Tortuosity of the mid cervical right ICA without significant stenosis. 3. Atherosclerotic changes within the cavernous internal carotid arteries and at the dural margin of the vertebral arteries bilaterally without significant stenosis. 4. Mild to moderate distal small vessel disease without significant proximal stenosis, aneurysm, or branch vessel occlusion within the circle of Willis.   Electronically Signed   By: San Morelle M.D.   On: 10/19/2014 21:06   I have personally reviewed and evaluated these images and lab results as part of my medical decision-making.   EKG Interpretation   Date/Time:  Sunday October 19 2014 16:24:27 EDT Ventricular Rate:  87 PR Interval:  249 QRS Duration: 150 QT Interval:  406 QTC Calculation: 488 R Axis:    -28 Text Interpretation:  Sinus rhythm Prolonged PR interval Right bundle  branch block Anterolateral infarct, old No significant change since last  tracing Confirmed by Lonia Skinner ([id number]) on 10/19/2014 5:43:25 PM      MDM  Patient was seen and evaluated in stable condition.  Presentation concerning for possible posterior CVA.  NIHSS 0.  Code stroke called and neurology evaluated patient with bedside technology.  Neurology recommended overnight monitoring, MRI brain, 2D Echo, CTA brain and neck.  Discussed with Dr. Anastasio Champion who agreed with admission.  Patient was admitted on telemetry under his care with MRI and CTAs ordered.  Final diagnoses:  Cerebral infarction due to unspecified mechanism  Dizziness  Dizziness    1. Acute dizziness  2. Concern for possible posterior CVA    Harvel Quale, MD 10/19/14 2146

## 2014-10-19 NOTE — ED Notes (Signed)
Pt has received a total of 1046ml bolus initiated at 1600. This bolus completed at 1640.

## 2014-10-19 NOTE — ED Notes (Signed)
[place] assessment attempted. Connectivity issues. Assessment began via phone at 1716. [facility] Neurologist able to see patient but not able to communicate via teleneuro machine.

## 2014-10-19 NOTE — H&P (Signed)
[facility] History and Physical  Grant Stevens WUJ:[id number] DOB: [birth year] DOA: 10/19/2014  Referring physician: ER PCP: [name], MD   Chief Complaint: Dizziness, blurred vision.  HPI: Grant Stevens is a 74 y.o. male  This is a 74 year old man who at approximately 3 PM today had onset of dizziness and blurred vision. He has a history of hypertension, coronary artery disease, hyperlipidemia, sleep apnea. He felt off balance. He denied any clear weakness in his limbs. He denies any chest pain, palpitations or dyspnea. He  feels back to his usual self now after the episode lasted approximately 45 minutes. He is now being admitted for further investigation.  Review of Systems:  Apart from symptoms above, all systems negative  Past Medical History  Diagnosis Date  . Coronary atherosclerosis of native coronary artery     Multivessel s/p CABG in 06/2001 post-IMI, LVEF 35% up to 50% postoperatively;  . Essential hypertension, benign   . Hyperlipidemia   . Sleep apnea   . History of bipolar disorder   . Allergic rhinitis   . Asthma   . Erectile dysfunction   . Atrial fibrillation ([place])     Remote history - WARCEF  . Myocardial infarction [facility])    Past Surgical History  Procedure Laterality Date  . Coronary artery bypass graft  08/2000    Dr. Servando Snare - LIMA to LAD, SVG to diagonal, SVG to PDA  . Tonsillectomy    . Circumcision  10/2003  . Colonoscopy N/A 07/11/2012    Procedure: COLONOSCOPY;  Surgeon: Rogene Houston, MD;  Location[facility];  Service: Endoscopy;  Laterality: N/A;  830-moved to 930 Ann to notify pt   Social History:  reports that he quit smoking about 43 years ago. His smoking use included Cigarettes. He has a 15 pack-year smoking history. He does not have any smokeless tobacco history on file. He reports that he does not drink alcohol or use illicit drugs.  No Known Allergies  Family History  Problem Relation Age of Onset  . Asthma Mother     . Heart attack Father 18  . Diabetes Brother      Prior to Admission medications   Medication Sig Start Date End Date Taking? Authorizing Provider  aspirin EC 81 MG tablet Take 81 mg by mouth daily.   Yes Historical Provider, MD  atorvastatin (LIPITOR) 80 MG tablet Take 80 mg by mouth at bedtime.      Historical Provider, MD  carbamazepine (TEGRETOL) 200 MG tablet Take 200 mg by mouth 4 (four) times daily. Take 2 tablets in the morning and 2 at bedtime.    Historical Provider, MD  furosemide (LASIX) 40 MG tablet Take 20-40 mg by mouth daily.  08/11/14   Historical Provider, MD  lisinopril (PRINIVIL,ZESTRIL) 5 MG tablet TAKE ONE TABLET BY MOUTH ONCE DAILY. 09/05/14   [name], MD  risperiDONE (RISPERDAL) 1 MG tablet Take 0.5-1.5 mg by mouth 2 (two) times daily. Takes half tablet in the morning and 1.5 tablet at night    Historical Provider, MD   Physical Exam: Filed Vitals:   10/19/14 1800 10/19/14 1816 10/19/14 1818 10/19/14 1949  BP: 130/76 141/84 123/70 135/90  Pulse: 102 107 105 106  Temp:      TempSrc:      Resp: 18 18 18 18   Height:    5\' 11"  (1.803 m)  Weight:      SpO2: 96% 97% 97% 98%    Wt Readings from  Last 3 Encounters:  10/19/14 101.606 kg (224 lb)  10/16/14 101.606 kg (224 lb)  11/06/13 102.967 kg (227 lb)    General:  Appears calm and comfortable. Alert and orientated. His speech is normal. Eyes: PERRL, normal lids, irises & conjunctiva ENT: grossly normal hearing, lips & tongue Neck: no LAD, masses or thyromegaly Cardiovascular: RRR, no m/r/g. No LE edema. Telemetry: SR, no arrhythmias  Respiratory: CTA bilaterally, no w/r/r. Normal respiratory effort. Abdomen: soft, ntnd Skin: no rash or induration seen on limited exam Musculoskeletal: grossly normal tone BUE/BLE Psychiatric: grossly normal mood and affect, speech fluent and appropriate Neurologic: grossly non-focal. In particular, he does not appear to have cerebellar signs at the present time.            Labs on Admission:  Basic Metabolic Panel:  Recent Labs Lab 10/19/14 1610 10/19/14 1624  NA 133* 133*  K 4.1 4.1  CL 99* 96*  CO2 26  --   GLUCOSE 165* 166*  BUN 19 18  CREATININE 1.42* 1.40*  CALCIUM 8.6*  --    Liver Function Tests:  Recent Labs Lab 10/19/14 1610  AST 24  ALT 20  ALKPHOS 93  BILITOT 0.5  PROT 6.4*  ALBUMIN 4.0   No results for input(s): LIPASE, AMYLASE in the last 168 hours. No results for input(s): AMMONIA in the last 168 hours. CBC:  Recent Labs Lab 10/19/14 1610 10/19/14 1624  WBC 10.8*  --   NEUTROABS 8.9*  --   HGB 13.7 14.6  HCT 39.8 43.0  MCV 91.3  --   PLT 142*  --    Cardiac Enzymes: No results for input(s): CKTOTAL, CKMB, CKMBINDEX, TROPONINI in the last 168 hours.  BNP (last 3 results) No results for input(s): BNP in the last 8760 hours.  ProBNP (last 3 results) No results for input(s): PROBNP in the last 8760 hours.  CBG:  Recent Labs Lab 10/19/14 1556  GLUCAP 152*    Radiological Exams on Admission: Ct Head Wo Contrast  10/19/2014   CLINICAL DATA:  Code stroke.  Acute dizziness.  EXAM: CT HEAD WITHOUT CONTRAST  TECHNIQUE: Contiguous axial images were obtained from the base of the skull through the vertex without intravenous contrast.  COMPARISON:  None.  FINDINGS: Mild cerebral atrophy is within normal limits for age. There is no evidence of acute cortical infarct, intracranial hemorrhage, mass, midline shift, or extra-axial fluid collection.  Orbits are unremarkable. The visualized paranasal sinuses and mastoid air cells are clear. Mild atherosclerotic calcification is noted at the skullbase.  IMPRESSION: Unremarkable CT appearance of the brain for age.  These results were called by telephone at the time of interpretation on 10/19/2014 at 4:32 pm to Dr. Lonia Skinner , who verbally acknowledged these results.   Electronically Signed   By: Logan Bores M.D.   On: 10/19/2014 16:33       Assessment/Plan   1. Dizziness and blurred vision. Etiology is not clear but cerebellar/posterior circulation CVA could be the etiology. CT brain scan is unremarkable. Check MRI brain scan and neurology consultation in the morning. 2. Hypertension. Continue with home medications. 3. History of atrial fibrillation. Patient is currently in sinus rhythm and is not on any anticoagulation therapy.  He will be admitted to telemetry. Further recommendations will depend on patient's hospital progress.   Code Status: Full code.  DVT Prophylaxis: Lovenox.  Family Communication: I discussed the plan with the patient at the bedside.  Disposition Plan: Home when medically stable.  Time spent: 60 minutes.  Doree Albee [facility] Pager [phone].

## 2014-10-19 NOTE — ED Notes (Addendum)
EDP at bedside 1600. Pt alert and oriented. Speech slurred and blurred vision.

## 2014-10-19 NOTE — ED Notes (Addendum)
CT scan completed at 1622. pt returned from to room 1624.

## 2014-10-19 NOTE — Progress Notes (Signed)
Phone call @ 14:12. Beeper did not go off.   2nd code stroke. Idamae Lusher called [facility]iology and spoke with Suanne Marker.

## 2014-10-20 ENCOUNTER — Inpatient Hospital Stay (HOSPITAL_COMMUNITY): Payer: Medicare Other

## 2014-10-20 DIAGNOSIS — H538 Other visual disturbances: Secondary | ICD-10-CM | POA: Diagnosis not present

## 2014-10-20 DIAGNOSIS — R42 Dizziness and giddiness: Secondary | ICD-10-CM | POA: Diagnosis not present

## 2014-10-20 LAB — COMPREHENSIVE METABOLIC PANEL
ALT: 19 U/L (ref 17–63)
ANION GAP: 5 (ref 5–15)
AST: 23 U/L (ref 15–41)
Albumin: 3.7 g/dL (ref 3.5–5.0)
Alkaline Phosphatase: 92 U/L (ref 38–126)
BUN: 16 mg/dL (ref 6–20)
CHLORIDE: 100 mmol/L — AB (ref 101–111)
CO2: 28 mmol/L (ref 22–32)
Calcium: 7.9 mg/dL — ABNORMAL LOW (ref 8.9–10.3)
Creatinine, Ser: 1.06 mg/dL (ref 0.61–1.24)
GFR calc Af Amer: >60 mL/min (ref >60)
GFR calc non Af Amer: >60 mL/min (ref >60)
GLUCOSE: 100 mg/dL — AB (ref 65–99)
POTASSIUM: 4.4 mmol/L (ref 3.5–5.1)
Sodium: 133 mmol/L — ABNORMAL LOW (ref 135–145)
Total Bilirubin: 0.5 mg/dL (ref 0.3–1.2)
Total Protein: 6.2 g/dL — ABNORMAL LOW (ref 6.5–8.1)

## 2014-10-20 LAB — URINALYSIS, ROUTINE W REFLEX MICROSCOPIC
BILIRUBIN URINE: NEGATIVE
Glucose, UA: NEGATIVE mg/dL
Ketones, ur: NEGATIVE mg/dL
Leukocytes, UA: NEGATIVE
Nitrite: NEGATIVE
PH: 6 (ref 5.0–8.0)
Protein, ur: NEGATIVE mg/dL
SPECIFIC GRAVITY, URINE: 1.01 (ref 1.005–1.030)
Urobilinogen, UA: 0.2 mg/dL (ref 0.0–1.0)

## 2014-10-20 LAB — CBC
HEMATOCRIT: 39.3 % (ref 39.0–52.0)
HEMOGLOBIN: 13 g/dL (ref 13.0–17.0)
MCH: 30.4 pg (ref 26.0–34.0)
MCHC: 33.1 g/dL (ref 30.0–36.0)
MCV: 92 fL (ref 78.0–100.0)
Platelets: 140 10*3/uL — ABNORMAL LOW (ref 150–400)
RBC: 4.27 MIL/uL (ref 4.22–5.81)
RDW: 12.8 % (ref 11.5–15.5)
WBC: 9.9 10*3/uL (ref 4.0–10.5)

## 2014-10-20 LAB — RAPID URINE DRUG SCREEN, HOSP PERFORMED
AMPHETAMINES: NOT DETECTED
Barbiturates: NOT DETECTED
Benzodiazepines: NOT DETECTED
COCAINE: NOT DETECTED
Opiates: NOT DETECTED
TETRAHYDROCANNABINOL: NOT DETECTED

## 2014-10-20 LAB — URINE MICROSCOPIC-ADD ON

## 2014-10-20 LAB — TSH: TSH: 3.611 u[IU]/mL (ref 0.350–4.500)

## 2014-10-20 MED ORDER — RISPERIDONE 1 MG PO TABS: 1.5000 mg | ORAL_TABLET | Freq: Every day | ORAL | Status: DC

## 2014-10-20 MED ORDER — ENOXAPARIN SODIUM 60 MG/0.6ML ~~LOC~~ SOLN
50.0000 mg | SUBCUTANEOUS | Status: DC
  Administered 2014-10-20: 50 mg via SUBCUTANEOUS
  Filled 2014-10-20: qty 0.6

## 2014-10-20 MED ORDER — RISPERIDONE 0.5 MG PO TABS
0.5000 mg | ORAL_TABLET | Freq: Every day | ORAL | Status: DC
  Administered 2014-10-21: 0.5 mg via ORAL
  Filled 2014-10-20: qty 1

## 2014-10-20 MED ORDER — RISPERIDONE 1 MG PO TABS
1.5000 mg | ORAL_TABLET | Freq: Every day | ORAL | Status: DC
  Administered 2014-10-20: 1.5 mg via ORAL
  Filled 2014-10-20 (×2): qty 1

## 2014-10-20 NOTE — Progress Notes (Signed)
Subjective: [name] was admitted yesterday after experiencing dizziness and blurry vision while driving. He had been to [employer] and had a large lunch at the [place] in. Symptoms lasted 30-45 minutes. He was evaluated with a CT scan of the head which revealed no evidence of stroke. He had a CT angiogram of the head and neck which revealed no significant stenosis. He feels back to baseline now.  Objective: Vital signs in last 24 hours: Filed Vitals:   10/19/14 1816 10/19/14 1818 10/19/14 1949 10/20/14 0622  BP: 141/84 123/70 135/90 153/83  Pulse: 107 105 106 78  Temp:   98 F (36.7 C) 98 F (36.7 C)  TempSrc:   Oral Oral  Resp: 18 18 18 20   Height:   5\' 11"  (1.803 m)   Weight:   226 lb 1.6 oz (102.558 kg)   SpO2: 97% 97% 98% 98%   Weight change:  No intake or output data in the 24 hours ending 10/20/14 8099  Physical Exam: Alert. No distress. Speech intact. Face symmetric. No carotid bruits. Lungs clear. Heart regular with no murmurs. Abdomen soft and nontender with no hepatosplenomegaly. Extremities reveal no edema. Neuro grossly intact. Psychiatric status stable.  Lab Results:    Results for orders placed or performed during the hospital encounter of 10/19/14 (from the past 24 hour(s))  CBG monitoring, ED     Status: Abnormal   Collection Time: 10/19/14  3:56 PM  Result Value Ref Range   Glucose-Capillary 152 (H) 65 - 99 mg/dL  Ethanol     Status: None   Collection Time: 10/19/14  4:10 PM  Result Value Ref Range   Alcohol, Ethyl (B) <5 <5 mg/dL  Protime-INR     Status: None   Collection Time: 10/19/14  4:10 PM  Result Value Ref Range   Prothrombin Time 14.4 11.6 - 15.2 seconds   INR 1.10 0.00 - 1.49  APTT     Status: None   Collection Time: 10/19/14  4:10 PM  Result Value Ref Range   aPTT 25 24 - 37 seconds  CBC     Status: Abnormal   Collection Time: 10/19/14  4:10 PM  Result Value Ref Range   WBC 10.8 (H) 4.0 - 10.5 K/uL   RBC 4.36 4.22 - 5.81 MIL/uL    Hemoglobin 13.7 13.0 - 17.0 g/dL   HCT 39.8 39.0 - 52.0 %   MCV 91.3 78.0 - 100.0 fL   MCH 31.4 26.0 - 34.0 pg   MCHC 34.4 30.0 - 36.0 g/dL   RDW 12.6 11.5 - 15.5 %   Platelets 142 (L) 150 - 400 K/uL  Differential     Status: Abnormal   Collection Time: 10/19/14  4:10 PM  Result Value Ref Range   Neutrophils Relative % 82 %   Neutro Abs 8.9 (H) 1.7 - 7.7 K/uL   Lymphocytes Relative 10 %   Lymphs Abs 1.1 0.7 - 4.0 K/uL   Monocytes Relative 6 %   Monocytes Absolute 0.6 0.1 - 1.0 K/uL   Eosinophils Relative 2 %   Eosinophils Absolute 0.2 0.0 - 0.7 K/uL   Basophils Relative 0 %   Basophils Absolute 0.0 0.0 - 0.1 K/uL  Comprehensive metabolic panel     Status: Abnormal   Collection Time: 10/19/14  4:10 PM  Result Value Ref Range   Sodium 133 (L) 135 - 145 mmol/L   Potassium 4.1 3.5 - 5.1 mmol/L   Chloride 99 (L) 101 - 111 mmol/L  CO2 26 22 - 32 mmol/L   Glucose, Bld 165 (H) 65 - 99 mg/dL   BUN 19 6 - 20 mg/dL   Creatinine, Ser 1.42 (H) 0.61 - 1.24 mg/dL   Calcium 8.6 (L) 8.9 - 10.3 mg/dL   Total Protein 6.4 (L) 6.5 - 8.1 g/dL   Albumin 4.0 3.5 - 5.0 g/dL   AST 24 15 - 41 U/L   ALT 20 17 - 63 U/L   Alkaline Phosphatase 93 38 - 126 U/L   Total Bilirubin 0.5 0.3 - 1.2 mg/dL   GFR calc non Af Amer 47 (L) >60 mL/min   GFR calc Af Amer 55 (L) >60 mL/min   Anion gap 8 5 - 15  I-Stat Chem 8, ED  (not at [facility], [facility])     Status: Abnormal   Collection Time: 10/19/14  4:24 PM  Result Value Ref Range   Sodium 133 (L) 135 - 145 mmol/L   Potassium 4.1 3.5 - 5.1 mmol/L   Chloride 96 (L) 101 - 111 mmol/L   BUN 18 6 - 20 mg/dL   Creatinine, Ser 1.40 (H) 0.61 - 1.24 mg/dL   Glucose, Bld 166 (H) 65 - 99 mg/dL   Calcium, Ion 1.13 1.13 - 1.30 mmol/L   TCO2 24 0 - 100 mmol/L   Hemoglobin 14.6 13.0 - 17.0 g/dL   HCT 43.0 39.0 - 52.0 %  I-stat troponin, ED (not at [facility], [facility])     Status: None   Collection Time: 10/19/14  4:26 PM  Result Value Ref Range   Troponin i, poc 0.01 0.00 - 0.08  ng/mL   Comment 3          Comprehensive metabolic panel     Status: Abnormal   Collection Time: 10/20/14  5:10 AM  Result Value Ref Range   Sodium 133 (L) 135 - 145 mmol/L   Potassium 4.4 3.5 - 5.1 mmol/L   Chloride 100 (L) 101 - 111 mmol/L   CO2 28 22 - 32 mmol/L   Glucose, Bld 100 (H) 65 - 99 mg/dL   BUN 16 6 - 20 mg/dL   Creatinine, Ser 1.06 0.61 - 1.24 mg/dL   Calcium 7.9 (L) 8.9 - 10.3 mg/dL   Total Protein 6.2 (L) 6.5 - 8.1 g/dL   Albumin 3.7 3.5 - 5.0 g/dL   AST 23 15 - 41 U/L   ALT 19 17 - 63 U/L   Alkaline Phosphatase 92 38 - 126 U/L   Total Bilirubin 0.5 0.3 - 1.2 mg/dL   GFR calc non Af Amer >60 >60 mL/min   GFR calc Af Amer >60 >60 mL/min   Anion gap 5 5 - 15  CBC     Status: Abnormal   Collection Time: 10/20/14  5:10 AM  Result Value Ref Range   WBC 9.9 4.0 - 10.5 K/uL   RBC 4.27 4.22 - 5.81 MIL/uL   Hemoglobin 13.0 13.0 - 17.0 g/dL   HCT 39.3 39.0 - 52.0 %   MCV 92.0 78.0 - 100.0 fL   MCH 30.4 26.0 - 34.0 pg   MCHC 33.1 30.0 - 36.0 g/dL   RDW 12.8 11.5 - 15.5 %   Platelets 140 (L) 150 - 400 K/uL     ABGS  Recent Labs  10/19/14 1624  TCO2 24   CULTURES No results found for this or any previous visit (from the past 240 hour(s)). Studies/Results: Ct Angio Head W/cm &/or Wo Cm  10/19/2014   CLINICAL  DATA:  Sudden onset of dizziness and blurred vision beginning at 3 o'clock today. Episode lasted 45 minutes. Symptoms have since resolved.  EXAM: CT ANGIOGRAPHY HEAD AND NECK  TECHNIQUE: Multidetector CT imaging of the head and neck was performed using the standard protocol during bolus administration of intravenous contrast. Multiplanar CT image reconstructions and MIPs were obtained to evaluate the vascular anatomy. Carotid stenosis measurements (when applicable) are obtained utilizing NASCET criteria, using the distal internal carotid diameter as the denominator.  CONTRAST:  161mL OMNIPAQUE IOHEXOL 350 MG/ML SOLN  COMPARISON:  CT head without contrast from the  same day.  FINDINGS: CT HEAD  Brain: Source images demonstrate no acute infarct, hemorrhage, or mass lesion. The basal ganglia are intact. The insular ribbon is normal. The cerebral cortex is unremarkable.  Calvarium and skull base: Negative.  Paranasal sinuses: A polyp or mucous retention cyst is evident along the inferior aspect of the left maxillary sinus. Paranasal sinuses and mastoid air cells are otherwise clear.  Orbits: Within normal limits  CTA NECK  Aortic arch: There is a common origin of the left common carotid artery in the innominate artery. Atherosclerotic calcifications are present at the aortic arch and origins of the left subclavian artery and left common carotid artery without significant stenosis.  Right carotid system: The right common carotid artery there is within normal limits. Atherosclerotic calcifications are present at the carotid bifurcation without significant stenosis. There is moderate tortuosity within the mid cervical right ICA without significant stenosis.  Left carotid system: The left common carotid artery is within normal limits. Atherosclerotic calcifications are present at the carotid bifurcation without significant stenosis relative to the more distal vessel. The cervical left ICA is normal.  Vertebral arteries:The vertebral arteries originate from the subclavian arteries bilaterally without significant stenosis. There is no significant vertebral artery stenosis in the neck. The left vertebral artery is slightly dominant to the right.  Skeleton: Bone windows demonstrate mild focal degenerative change at C5-6, worse on the left. No focal lytic or blastic lesions are present. The mandible is intact and located.  Other neck: Soft tissues the neck are unremarkable. Thyroid is within normal limits. No focal mucosal or submucosal lesions are present. Salivary glands are intact.7 the lung apices are clear.  CTA HEAD  Anterior circulation: Atherosclerotic calcifications are present  within the cavernous internal carotid arteries bilaterally. The ICA termini are within normal limits. The A1 and M1 segments are normal. The anterior communicating artery is patent. The MCA bifurcations are within normal limits. There is moderate attenuation of MCA branch vessels bilaterally.  Posterior circulation: Atherosclerotic calcifications are noted at the dural margin of the vertebral arteries bilaterally. There is mild irregularity just be on the vertebrobasilar junction. The left PICA origin is visualized and normal. The right AICA is dominant. Both posterior cerebral arteries originate from the basilar tip. There is some attenuation of distal PCA branch vessels.  Venous sinuses: The dural sinuses are patent. The straight sinus and deep cerebral veins are within normal limits. Cortical veins are unremarkable.  Anatomic variants: None  Delayed phase: No pathologic enhancement is present.  IMPRESSION: 1. Atherosclerotic calcifications at the carotid bifurcations bilaterally without significant focal stenosis relative to the more distal vessel. 2. Tortuosity of the mid cervical right ICA without significant stenosis. 3. Atherosclerotic changes within the cavernous internal carotid arteries and at the dural margin of the vertebral arteries bilaterally without significant stenosis. 4. Mild to moderate distal small vessel disease without significant proximal stenosis, aneurysm, or  branch vessel occlusion within the circle of Willis.   Electronically Signed   By: San Morelle M.D.   On: 10/19/2014 21:06   Ct Head Wo Contrast  10/19/2014   CLINICAL DATA:  Code stroke.  Acute dizziness.  EXAM: CT HEAD WITHOUT CONTRAST  TECHNIQUE: Contiguous axial images were obtained from the base of the skull through the vertex without intravenous contrast.  COMPARISON:  None.  FINDINGS: Mild cerebral atrophy is within normal limits for age. There is no evidence of acute cortical infarct, intracranial hemorrhage, mass,  midline shift, or extra-axial fluid collection.  Orbits are unremarkable. The visualized paranasal sinuses and mastoid air cells are clear. Mild atherosclerotic calcification is noted at the skullbase.  IMPRESSION: Unremarkable CT appearance of the brain for age.  These results were called by telephone at the time of interpretation on 10/19/2014 at 4:32 pm to Dr. Lonia Skinner , who verbally acknowledged these results.   Electronically Signed   By: Logan Bores M.D.   On: 10/19/2014 16:33   Ct Angio Neck W/cm &/or Wo/cm  10/19/2014   CLINICAL DATA:  Sudden onset of dizziness and blurred vision beginning at 3 o'clock today. Episode lasted 45 minutes. Symptoms have since resolved.  EXAM: CT ANGIOGRAPHY HEAD AND NECK  TECHNIQUE: Multidetector CT imaging of the head and neck was performed using the standard protocol during bolus administration of intravenous contrast. Multiplanar CT image reconstructions and MIPs were obtained to evaluate the vascular anatomy. Carotid stenosis measurements (when applicable) are obtained utilizing NASCET criteria, using the distal internal carotid diameter as the denominator.  CONTRAST:  155mL OMNIPAQUE IOHEXOL 350 MG/ML SOLN  COMPARISON:  CT head without contrast from the same day.  FINDINGS: CT HEAD  Brain: Source images demonstrate no acute infarct, hemorrhage, or mass lesion. The basal ganglia are intact. The insular ribbon is normal. The cerebral cortex is unremarkable.  Calvarium and skull base: Negative.  Paranasal sinuses: A polyp or mucous retention cyst is evident along the inferior aspect of the left maxillary sinus. Paranasal sinuses and mastoid air cells are otherwise clear.  Orbits: Within normal limits  CTA NECK  Aortic arch: There is a common origin of the left common carotid artery in the innominate artery. Atherosclerotic calcifications are present at the aortic arch and origins of the left subclavian artery and left common carotid artery without significant stenosis.   Right carotid system: The right common carotid artery there is within normal limits. Atherosclerotic calcifications are present at the carotid bifurcation without significant stenosis. There is moderate tortuosity within the mid cervical right ICA without significant stenosis.  Left carotid system: The left common carotid artery is within normal limits. Atherosclerotic calcifications are present at the carotid bifurcation without significant stenosis relative to the more distal vessel. The cervical left ICA is normal.  Vertebral arteries:The vertebral arteries originate from the subclavian arteries bilaterally without significant stenosis. There is no significant vertebral artery stenosis in the neck. The left vertebral artery is slightly dominant to the right.  Skeleton: Bone windows demonstrate mild focal degenerative change at C5-6, worse on the left. No focal lytic or blastic lesions are present. The mandible is intact and located.  Other neck: Soft tissues the neck are unremarkable. Thyroid is within normal limits. No focal mucosal or submucosal lesions are present. Salivary glands are intact.7 the lung apices are clear.  CTA HEAD  Anterior circulation: Atherosclerotic calcifications are present within the cavernous internal carotid arteries bilaterally. The ICA termini are within normal limits. The  A1 and M1 segments are normal. The anterior communicating artery is patent. The MCA bifurcations are within normal limits. There is moderate attenuation of MCA branch vessels bilaterally.  Posterior circulation: Atherosclerotic calcifications are noted at the dural margin of the vertebral arteries bilaterally. There is mild irregularity just be on the vertebrobasilar junction. The left PICA origin is visualized and normal. The right AICA is dominant. Both posterior cerebral arteries originate from the basilar tip. There is some attenuation of distal PCA branch vessels.  Venous sinuses: The dural sinuses are patent.  The straight sinus and deep cerebral veins are within normal limits. Cortical veins are unremarkable.  Anatomic variants: None  Delayed phase: No pathologic enhancement is present.  IMPRESSION: 1. Atherosclerotic calcifications at the carotid bifurcations bilaterally without significant focal stenosis relative to the more distal vessel. 2. Tortuosity of the mid cervical right ICA without significant stenosis. 3. Atherosclerotic changes within the cavernous internal carotid arteries and at the dural margin of the vertebral arteries bilaterally without significant stenosis. 4. Mild to moderate distal small vessel disease without significant proximal stenosis, aneurysm, or branch vessel occlusion within the circle of Willis.   Electronically Signed   By: San Morelle M.D.   On: 10/19/2014 21:06   Micro Results: No results found for this or any previous visit (from the past 240 hour(s)). Studies/Results: Ct Angio Head W/cm &/or Wo Cm  10/19/2014   CLINICAL DATA:  Sudden onset of dizziness and blurred vision beginning at 3 o'clock today. Episode lasted 45 minutes. Symptoms have since resolved.  EXAM: CT ANGIOGRAPHY HEAD AND NECK  TECHNIQUE: Multidetector CT imaging of the head and neck was performed using the standard protocol during bolus administration of intravenous contrast. Multiplanar CT image reconstructions and MIPs were obtained to evaluate the vascular anatomy. Carotid stenosis measurements (when applicable) are obtained utilizing NASCET criteria, using the distal internal carotid diameter as the denominator.  CONTRAST:  159mL OMNIPAQUE IOHEXOL 350 MG/ML SOLN  COMPARISON:  CT head without contrast from the same day.  FINDINGS: CT HEAD  Brain: Source images demonstrate no acute infarct, hemorrhage, or mass lesion. The basal ganglia are intact. The insular ribbon is normal. The cerebral cortex is unremarkable.  Calvarium and skull base: Negative.  Paranasal sinuses: A polyp or mucous retention cyst  is evident along the inferior aspect of the left maxillary sinus. Paranasal sinuses and mastoid air cells are otherwise clear.  Orbits: Within normal limits  CTA NECK  Aortic arch: There is a common origin of the left common carotid artery in the innominate artery. Atherosclerotic calcifications are present at the aortic arch and origins of the left subclavian artery and left common carotid artery without significant stenosis.  Right carotid system: The right common carotid artery there is within normal limits. Atherosclerotic calcifications are present at the carotid bifurcation without significant stenosis. There is moderate tortuosity within the mid cervical right ICA without significant stenosis.  Left carotid system: The left common carotid artery is within normal limits. Atherosclerotic calcifications are present at the carotid bifurcation without significant stenosis relative to the more distal vessel. The cervical left ICA is normal.  Vertebral arteries:The vertebral arteries originate from the subclavian arteries bilaterally without significant stenosis. There is no significant vertebral artery stenosis in the neck. The left vertebral artery is slightly dominant to the right.  Skeleton: Bone windows demonstrate mild focal degenerative change at C5-6, worse on the left. No focal lytic or blastic lesions are present. The mandible is intact and located.  Other neck: Soft tissues the neck are unremarkable. Thyroid is within normal limits. No focal mucosal or submucosal lesions are present. Salivary glands are intact.7 the lung apices are clear.  CTA HEAD  Anterior circulation: Atherosclerotic calcifications are present within the cavernous internal carotid arteries bilaterally. The ICA termini are within normal limits. The A1 and M1 segments are normal. The anterior communicating artery is patent. The MCA bifurcations are within normal limits. There is moderate attenuation of MCA branch vessels bilaterally.   Posterior circulation: Atherosclerotic calcifications are noted at the dural margin of the vertebral arteries bilaterally. There is mild irregularity just be on the vertebrobasilar junction. The left PICA origin is visualized and normal. The right AICA is dominant. Both posterior cerebral arteries originate from the basilar tip. There is some attenuation of distal PCA branch vessels.  Venous sinuses: The dural sinuses are patent. The straight sinus and deep cerebral veins are within normal limits. Cortical veins are unremarkable.  Anatomic variants: None  Delayed phase: No pathologic enhancement is present.  IMPRESSION: 1. Atherosclerotic calcifications at the carotid bifurcations bilaterally without significant focal stenosis relative to the more distal vessel. 2. Tortuosity of the mid cervical right ICA without significant stenosis. 3. Atherosclerotic changes within the cavernous internal carotid arteries and at the dural margin of the vertebral arteries bilaterally without significant stenosis. 4. Mild to moderate distal small vessel disease without significant proximal stenosis, aneurysm, or branch vessel occlusion within the circle of Willis.   Electronically Signed   By: San Morelle M.D.   On: 10/19/2014 21:06   Ct Head Wo Contrast  10/19/2014   CLINICAL DATA:  Code stroke.  Acute dizziness.  EXAM: CT HEAD WITHOUT CONTRAST  TECHNIQUE: Contiguous axial images were obtained from the base of the skull through the vertex without intravenous contrast.  COMPARISON:  None.  FINDINGS: Mild cerebral atrophy is within normal limits for age. There is no evidence of acute cortical infarct, intracranial hemorrhage, mass, midline shift, or extra-axial fluid collection.  Orbits are unremarkable. The visualized paranasal sinuses and mastoid air cells are clear. Mild atherosclerotic calcification is noted at the skullbase.  IMPRESSION: Unremarkable CT appearance of the brain for age.  These results were called by  telephone at the time of interpretation on 10/19/2014 at 4:32 pm to Dr. Lonia Skinner , who verbally acknowledged these results.   Electronically Signed   By: Logan Bores M.D.   On: 10/19/2014 16:33   Ct Angio Neck W/cm &/or Wo/cm  10/19/2014   CLINICAL DATA:  Sudden onset of dizziness and blurred vision beginning at 3 o'clock today. Episode lasted 45 minutes. Symptoms have since resolved.  EXAM: CT ANGIOGRAPHY HEAD AND NECK  TECHNIQUE: Multidetector CT imaging of the head and neck was performed using the standard protocol during bolus administration of intravenous contrast. Multiplanar CT image reconstructions and MIPs were obtained to evaluate the vascular anatomy. Carotid stenosis measurements (when applicable) are obtained utilizing NASCET criteria, using the distal internal carotid diameter as the denominator.  CONTRAST:  178mL OMNIPAQUE IOHEXOL 350 MG/ML SOLN  COMPARISON:  CT head without contrast from the same day.  FINDINGS: CT HEAD  Brain: Source images demonstrate no acute infarct, hemorrhage, or mass lesion. The basal ganglia are intact. The insular ribbon is normal. The cerebral cortex is unremarkable.  Calvarium and skull base: Negative.  Paranasal sinuses: A polyp or mucous retention cyst is evident along the inferior aspect of the left maxillary sinus. Paranasal sinuses and mastoid air cells are otherwise clear.  Orbits: Within normal limits  CTA NECK  Aortic arch: There is a common origin of the left common carotid artery in the innominate artery. Atherosclerotic calcifications are present at the aortic arch and origins of the left subclavian artery and left common carotid artery without significant stenosis.  Right carotid system: The right common carotid artery there is within normal limits. Atherosclerotic calcifications are present at the carotid bifurcation without significant stenosis. There is moderate tortuosity within the mid cervical right ICA without significant stenosis.  Left carotid  system: The left common carotid artery is within normal limits. Atherosclerotic calcifications are present at the carotid bifurcation without significant stenosis relative to the more distal vessel. The cervical left ICA is normal.  Vertebral arteries:The vertebral arteries originate from the subclavian arteries bilaterally without significant stenosis. There is no significant vertebral artery stenosis in the neck. The left vertebral artery is slightly dominant to the right.  Skeleton: Bone windows demonstrate mild focal degenerative change at C5-6, worse on the left. No focal lytic or blastic lesions are present. The mandible is intact and located.  Other neck: Soft tissues the neck are unremarkable. Thyroid is within normal limits. No focal mucosal or submucosal lesions are present. Salivary glands are intact.7 the lung apices are clear.  CTA HEAD  Anterior circulation: Atherosclerotic calcifications are present within the cavernous internal carotid arteries bilaterally. The ICA termini are within normal limits. The A1 and M1 segments are normal. The anterior communicating artery is patent. The MCA bifurcations are within normal limits. There is moderate attenuation of MCA branch vessels bilaterally.  Posterior circulation: Atherosclerotic calcifications are noted at the dural margin of the vertebral arteries bilaterally. There is mild irregularity just be on the vertebrobasilar junction. The left PICA origin is visualized and normal. The right AICA is dominant. Both posterior cerebral arteries originate from the basilar tip. There is some attenuation of distal PCA branch vessels.  Venous sinuses: The dural sinuses are patent. The straight sinus and deep cerebral veins are within normal limits. Cortical veins are unremarkable.  Anatomic variants: None  Delayed phase: No pathologic enhancement is present.  IMPRESSION: 1. Atherosclerotic calcifications at the carotid bifurcations bilaterally without significant  focal stenosis relative to the more distal vessel. 2. Tortuosity of the mid cervical right ICA without significant stenosis. 3. Atherosclerotic changes within the cavernous internal carotid arteries and at the dural margin of the vertebral arteries bilaterally without significant stenosis. 4. Mild to moderate distal small vessel disease without significant proximal stenosis, aneurysm, or branch vessel occlusion within the circle of Willis.   Electronically Signed   By: San Morelle M.D.   On: 10/19/2014 21:06   Medications:  I have reviewed the patient's current medications Scheduled Meds: . aspirin EC  81 mg Oral Daily  . atorvastatin  80 mg Oral QHS  . carbamazepine  200 mg Oral QID  . enoxaparin (LOVENOX) injection  30 mg Subcutaneous Q24H  . furosemide  20 mg Oral Daily  . lisinopril  5 mg Oral Daily  . risperiDONE  0.5-1.5 mg Oral BID  . sodium chloride  3 mL Intravenous Q12H   Continuous Infusions: . sodium chloride 75 mL/hr at 10/19/14 2152   PRN Meds:.ondansetron **OR** ondansetron (ZOFRAN) IV   Assessment/Plan: #1. Dizziness and blurry vision. Neurology has been consulted. MRI of the brain was ordered. Continue aspirin. No sinus stroke thus far on imaging. #2. Chronic systolic heart failure. Stable on medical therapy. #3. Coronary artery disease status post CABG. Stable. #4. Bipolar  disorder. Stable on current therapy. #5. History of hyponatremia. Sodium is 133. #6. Mild thrombocytopenia. Active Problems:   Essential hypertension, benign   Coronary atherosclerosis of native coronary artery   History of atrial fibrillation   Dizziness     LOS: 1 day   Shan Padgett 10/20/2014, 7:27 AM

## 2014-10-20 NOTE — Consult Note (Signed)
[place] A. Merlene Laughter, MD     [email or web address]          Grant Stevens is an 74 y.o. male.   ASSESSMENT/PLAN: 1. Acute onset of dizziness and visual disruption associated with ataxia and hypotension. It is most likely that the hypotension is etiology. No clear underlying neurological cause to explain his symptoms.  2. Mild cognitive impairment.  RECOGNITION: Dementia labs. Agree with physical therapy.  The patient is a 74 year old male who presented with acute onset of dizziness, blurred vision and gait ataxia. The patient was driving when these events occurred rather abruptly. He did not lose consciousness. The patient did have some dyspnea but no chest pain. No focal weakness or numbness is reported. No headaches are reported. The event lasted for about 45 minutes. The patient has been worked up in the initial workup showed that he was hypotensive in the emergency room with a blood pressure 85/55. He was given IV fluid and the responded well to this. He has been walking around. No recurrent spells are reported. The patient's sister is here and she reports that he has had some short-term memory impairment which she is concerned about. The review of systems otherwise unremarkable.   GENERAL: Patient is in no acute distress.  HEENT: Supple. Atraumatic normocephalic.   ABDOMEN: soft  EXTREMITIES: No edema. Incisional scar right lower extremity.   BACK: Normal.  SKIN: Normal by inspection.    MENTAL STATUS: Alert and oriented. Speech, language and cognition are generally intact. Judgment and insight normal.   CRANIAL NERVES: Pupils are equal, round and reactive to light and accommodation; extra ocular movements are full, there is no significant nystagmus; visual fields are full; upper and lower facial muscles are normal in strength and symmetric, there is no flattening of the nasolabial folds; tongue is midline; uvula is midline; shoulder elevation is  normal.  MOTOR: Normal tone, bulk and strength; no pronator drift.  COORDINATION: Left finger to nose is normal, right finger to nose is normal, No rest tremor; no intention tremor; no postural tremor; no bradykinesia.  REFLEXES: Deep tendon reflexes are symmetrical and normal - except right knee which is diminished 1+. Babinski reflexes are flexor bilaterally.   SENSATION: Normal to light touch.   Blood pressure 130/78, pulse 86, temperature 98.1 F (36.7 C), temperature source Oral, resp. rate 18, height $RemoveBe'5\' 11"'uvEPAIUEU$  (1.803 m), weight 102.558 kg (226 lb 1.6 oz), SpO2 98 %.  Past Medical History  Diagnosis Date  . Coronary atherosclerosis of native coronary artery     Multivessel s/p CABG in 06/2001 post-IMI, LVEF 35% up to 50% postoperatively;  . Essential hypertension, benign   . Hyperlipidemia   . Sleep apnea   . History of bipolar disorder   . Allergic rhinitis   . Asthma   . Erectile dysfunction   . Atrial fibrillation ([place])     Remote history - WARCEF  . Myocardial infarction [facility])     Past Surgical History  Procedure Laterality Date  . Coronary artery bypass graft  08/2000    Dr. Servando Snare - LIMA to LAD, SVG to diagonal, SVG to PDA  . Tonsillectomy    . Circumcision  10/2003  . Colonoscopy N/A 07/11/2012    Procedure: COLONOSCOPY;  Surgeon: Rogene Houston, MD;  Location: [facility];  Service: Endoscopy;  Laterality: N/A;  830-moved to 65 Ann to notify pt    Family History  Problem Relation Age of Onset  . Asthma Mother   .  Heart attack Father 13  . Diabetes Brother     Social History:  reports that he quit smoking about 43 years ago. His smoking use included Cigarettes. He has a 15 pack-year smoking history. He does not have any smokeless tobacco history on file. He reports that he does not drink alcohol or use illicit drugs.  Allergies: No Known Allergies  Medications: Prior to Admission medications   Medication Sig Start Date End Date Taking? Authorizing  Provider  aspirin EC 81 MG tablet Take 81 mg by mouth daily.   Yes Historical Provider, MD  atorvastatin (LIPITOR) 80 MG tablet Take 80 mg by mouth at bedtime.     Yes Historical Provider, MD  carbamazepine (TEGRETOL) 200 MG tablet Take 400 mg by mouth 2 (two) times daily.    Yes Historical Provider, MD  furosemide (LASIX) 40 MG tablet Take 20 mg by mouth daily.  08/11/14  Yes Historical Provider, MD  lisinopril (PRINIVIL,ZESTRIL) 5 MG tablet TAKE ONE TABLET BY MOUTH ONCE DAILY. 09/05/14  Yes [name], MD  risperiDONE (RISPERDAL) 1 MG tablet Take 0.5-1.5 mg by mouth 2 (two) times daily. Takes half tablet in the morning and 1.5 tablet at night   Yes Historical Provider, MD    Scheduled Meds: . aspirin EC  81 mg Oral Daily  . atorvastatin  80 mg Oral QHS  . carbamazepine  200 mg Oral QID  . enoxaparin (LOVENOX) injection  50 mg Subcutaneous Q24H  . furosemide  20 mg Oral Daily  . lisinopril  5 mg Oral Daily  . [START ON 10/21/2014] risperiDONE  0.5 mg Oral Daily  . risperiDONE  1.5 mg Oral QHS  . sodium chloride  3 mL Intravenous Q12H   Continuous Infusions: . sodium chloride 10 mL/hr at 10/20/14 0732   PRN Meds:.ondansetron **OR** ondansetron (ZOFRAN) IV     Results for orders placed or performed during the hospital encounter of 10/19/14 (from the past 48 hour(s))  CBG monitoring, ED     Status: Abnormal   Collection Time: 10/19/14  3:56 PM  Result Value Ref Range   Glucose-Capillary 152 (H) 65 - 99 mg/dL  Ethanol     Status: None   Collection Time: 10/19/14  4:10 PM  Result Value Ref Range   Alcohol, Ethyl (B) <5 <5 mg/dL    Comment:        LOWEST DETECTABLE LIMIT FOR SERUM ALCOHOL IS 5 mg/dL FOR MEDICAL PURPOSES ONLY   Protime-INR     Status: None   Collection Time: 10/19/14  4:10 PM  Result Value Ref Range   Prothrombin Time 14.4 11.6 - 15.2 seconds   INR 1.10 0.00 - 1.49  APTT     Status: None   Collection Time: 10/19/14  4:10 PM  Result Value Ref Range    aPTT 25 24 - 37 seconds  CBC     Status: Abnormal   Collection Time: 10/19/14  4:10 PM  Result Value Ref Range   WBC 10.8 (H) 4.0 - 10.5 K/uL   RBC 4.36 4.22 - 5.81 MIL/uL   Hemoglobin 13.7 13.0 - 17.0 g/dL   HCT 39.8 39.0 - 52.0 %   MCV 91.3 78.0 - 100.0 fL   MCH 31.4 26.0 - 34.0 pg   MCHC 34.4 30.0 - 36.0 g/dL   RDW 12.6 11.5 - 15.5 %   Platelets 142 (L) 150 - 400 K/uL  Differential     Status: Abnormal   Collection Time: 10/19/14  4:10  PM  Result Value Ref Range   Neutrophils Relative % 82 %   Neutro Abs 8.9 (H) 1.7 - 7.7 K/uL   Lymphocytes Relative 10 %   Lymphs Abs 1.1 0.7 - 4.0 K/uL   Monocytes Relative 6 %   Monocytes Absolute 0.6 0.1 - 1.0 K/uL   Eosinophils Relative 2 %   Eosinophils Absolute 0.2 0.0 - 0.7 K/uL   Basophils Relative 0 %   Basophils Absolute 0.0 0.0 - 0.1 K/uL  Comprehensive metabolic panel     Status: Abnormal   Collection Time: 10/19/14  4:10 PM  Result Value Ref Range   Sodium 133 (L) 135 - 145 mmol/L   Potassium 4.1 3.5 - 5.1 mmol/L   Chloride 99 (L) 101 - 111 mmol/L   CO2 26 22 - 32 mmol/L   Glucose, Bld 165 (H) 65 - 99 mg/dL   BUN 19 6 - 20 mg/dL   Creatinine, Ser 1.42 (H) 0.61 - 1.24 mg/dL   Calcium 8.6 (L) 8.9 - 10.3 mg/dL   Total Protein 6.4 (L) 6.5 - 8.1 g/dL   Albumin 4.0 3.5 - 5.0 g/dL   AST 24 15 - 41 U/L   ALT 20 17 - 63 U/L   Alkaline Phosphatase 93 38 - 126 U/L   Total Bilirubin 0.5 0.3 - 1.2 mg/dL   GFR calc non Af Amer 47 (L) >60 mL/min   GFR calc Af Amer 55 (L) >60 mL/min    Comment: (NOTE) The eGFR has been calculated using the CKD EPI equation. This calculation has not been validated in all clinical situations. eGFR's persistently <60 mL/min signify possible Chronic Kidney Disease.    Anion gap 8 5 - 15  I-Stat Chem 8, ED  (not at [facility], [facility])     Status: Abnormal   Collection Time: 10/19/14  4:24 PM  Result Value Ref Range   Sodium 133 (L) 135 - 145 mmol/L   Potassium 4.1 3.5 - 5.1 mmol/L   Chloride 96 (L) 101 - 111  mmol/L   BUN 18 6 - 20 mg/dL   Creatinine, Ser 1.40 (H) 0.61 - 1.24 mg/dL   Glucose, Bld 166 (H) 65 - 99 mg/dL   Calcium, Ion 1.13 1.13 - 1.30 mmol/L   TCO2 24 0 - 100 mmol/L   Hemoglobin 14.6 13.0 - 17.0 g/dL   HCT 43.0 39.0 - 52.0 %  I-stat troponin, ED (not at [facility], [facility])     Status: None   Collection Time: 10/19/14  4:26 PM  Result Value Ref Range   Troponin i, poc 0.01 0.00 - 0.08 ng/mL   Comment 3            Comment: Due to the release kinetics of cTnI, a negative result within the first hours of the onset of symptoms does not rule out myocardial infarction with certainty. If myocardial infarction is still suspected, repeat the test at appropriate intervals.   Comprehensive metabolic panel     Status: Abnormal   Collection Time: 10/20/14  5:10 AM  Result Value Ref Range   Sodium 133 (L) 135 - 145 mmol/L   Potassium 4.4 3.5 - 5.1 mmol/L   Chloride 100 (L) 101 - 111 mmol/L   CO2 28 22 - 32 mmol/L   Glucose, Bld 100 (H) 65 - 99 mg/dL   BUN 16 6 - 20 mg/dL   Creatinine, Ser 1.06 0.61 - 1.24 mg/dL   Calcium 7.9 (L) 8.9 - 10.3 mg/dL   Total Protein  6.2 (L) 6.5 - 8.1 g/dL   Albumin 3.7 3.5 - 5.0 g/dL   AST 23 15 - 41 U/L   ALT 19 17 - 63 U/L   Alkaline Phosphatase 92 38 - 126 U/L   Total Bilirubin 0.5 0.3 - 1.2 mg/dL   GFR calc non Af Amer >60 >60 mL/min   GFR calc Af Amer >60 >60 mL/min    Comment: (NOTE) The eGFR has been calculated using the CKD EPI equation. This calculation has not been validated in all clinical situations. eGFR's persistently <60 mL/min signify possible Chronic Kidney Disease.    Anion gap 5 5 - 15  CBC     Status: Abnormal   Collection Time: 10/20/14  5:10 AM  Result Value Ref Range   WBC 9.9 4.0 - 10.5 K/uL   RBC 4.27 4.22 - 5.81 MIL/uL   Hemoglobin 13.0 13.0 - 17.0 g/dL   HCT 39.3 39.0 - 52.0 %   MCV 92.0 78.0 - 100.0 fL   MCH 30.4 26.0 - 34.0 pg   MCHC 33.1 30.0 - 36.0 g/dL   RDW 12.8 11.5 - 15.5 %   Platelets 140 (L) 150 - 400 K/uL   Urine rapid drug screen (hosp performed)not at [facility]     Status: None   Collection Time: 10/20/14  9:30 AM  Result Value Ref Range   Opiates NONE DETECTED NONE DETECTED   Cocaine NONE DETECTED NONE DETECTED   Benzodiazepines NONE DETECTED NONE DETECTED   Amphetamines NONE DETECTED NONE DETECTED   Tetrahydrocannabinol NONE DETECTED NONE DETECTED   Barbiturates NONE DETECTED NONE DETECTED    Comment:        DRUG SCREEN FOR MEDICAL PURPOSES ONLY.  IF CONFIRMATION IS NEEDED FOR ANY PURPOSE, NOTIFY LAB WITHIN 5 DAYS.        LOWEST DETECTABLE LIMITS FOR URINE DRUG SCREEN Drug Class       Cutoff (ng/mL) Amphetamine      1000 Barbiturate      200 Benzodiazepine   443 Tricyclics       154 Opiates          300 Cocaine          300 THC              50   Urinalysis, Routine w reflex microscopic (not at [facility])     Status: Abnormal   Collection Time: 10/20/14  9:30 AM  Result Value Ref Range   Color, Urine YELLOW YELLOW   APPearance CLEAR CLEAR   Specific Gravity, Urine 1.010 1.005 - 1.030   pH 6.0 5.0 - 8.0   Glucose, UA NEGATIVE NEGATIVE mg/dL   Hgb urine dipstick TRACE (A) NEGATIVE   Bilirubin Urine NEGATIVE NEGATIVE   Ketones, ur NEGATIVE NEGATIVE mg/dL   Protein, ur NEGATIVE NEGATIVE mg/dL   Urobilinogen, UA 0.2 0.0 - 1.0 mg/dL   Nitrite NEGATIVE NEGATIVE   Leukocytes, UA NEGATIVE NEGATIVE  Urine microscopic-add on     Status: Abnormal   Collection Time: 10/20/14  9:30 AM  Result Value Ref Range   WBC, UA TOO NUMEROUS TO COUNT <3 WBC/hpf   RBC / HPF TOO NUMEROUS TO COUNT <3 RBC/hpf   Bacteria, UA FEW (A) RARE    Studies/Results: CTA HEAD-NECK 1. Atherosclerotic calcifications at the carotid bifurcations bilaterally without significant focal stenosis relative to the more distal vessel. 2. Tortuosity of the mid cervical right ICA without significant stenosis. 3. Atherosclerotic changes within the cavernous internal carotid arteries and at  the dural margin of the  vertebral arteries bilaterally without significant stenosis. 4. Mild to moderate distal small vessel disease without significant proximal stenosis, aneurysm, or branch vessel occlusion within the circle of Willis.   BRAIN MRI Unremarkable brain MRI for age. Mak Bonny A. Merlene Laughter, M.D.  Diplomate, Tax adviser of Psychiatry and Neurology ( Neurology). 10/20/2014, 6:38 PM

## 2014-10-20 NOTE — Care Management Note (Signed)
Case Management Note  Patient Details  Name: Grant Stevens MRN: [id number] Date of Birth: 11-30-1940  Subjective/Objective:                  Pt admitted from home with dizziness. Pt lives alone and will return home at discharge. Pt is independent with ADL's.  Action/Plan: Anticipate discharge within 24 hours. Pt meeting OBS criteria. Attending agreed to OBS. Pt changed to OBS and CC 44 given.  Expected Discharge Date:  10/20/14               Expected Discharge Plan:  Home/Self Care  In-House Referral:  NA  Discharge planning Services  CM Consult  Post Acute Care Choice:  NA Choice offered to:  NA  DME Arranged:    DME Agency:     HH Arranged:    HH Agency:     Status of Service:  Completed, signed off  Medicare Important Message Given:    Date Medicare IM Given:    Medicare IM give by:    Date Additional Medicare IM Given:    Additional Medicare Important Message give by:     If discussed at [place] of Stay Meetings, dates discussed:    Additional Comments:  Joylene Draft, RN 10/20/2014, 2:32 PM

## 2014-10-21 DIAGNOSIS — R42 Dizziness and giddiness: Secondary | ICD-10-CM | POA: Diagnosis not present

## 2014-10-21 LAB — VITAMIN B12: Vitamin B-12: 297 pg/mL (ref 180–914)

## 2014-10-21 MED ORDER — CIPROFLOXACIN HCL 500 MG PO TABS: 500.0000 mg | ORAL_TABLET | Freq: Two times a day (BID) | ORAL | Status: DC

## 2014-10-21 NOTE — Care Management Note (Signed)
Case Management Note  Patient Details  Name: Grant Stevens MRN: [id number] Date of Birth: 09-09-1940  Expected Discharge Date:  10/20/14               Expected Discharge Plan:  Home/Self Care  In-House Referral:  NA  Discharge planning Services  CM Consult  Post Acute Care Choice:  NA Choice offered to:  NA  DME Arranged:    DME Agency:     HH Arranged:    [place] Agency:     Status of Service:  Completed, signed off  Medicare Important Message Given:    Date Medicare IM Given:    Medicare IM give by:    Date Additional Medicare IM Given:    Additional Medicare Important Message give by:     If discussed at [place] of Stay Meetings, dates discussed:    Additional Comments: Pt discharging home today. No CM needs.  Sherald Barge, RN 10/21/2014, 2:56 PM

## 2014-10-21 NOTE — Discharge Summary (Signed)
Physician Discharge Summary  Grant Stevens STM:[id number] DOB: [birth year] DOA: 10/19/2014   Admit date: 10/19/2014 Discharge date: 10/21/2014  Discharge Diagnoses: #1. Dizziness and blurry vision due to hypotension. #2. Chronic systolic heart failure. #3. Coronary artery disease. #4. Bipolar disorder. #5. Mild cognitive impairment. #6. UTI. Active Problems:   Essential hypertension, benign   Coronary atherosclerosis of native coronary artery   History of atrial fibrillation   Dizziness    Wt Readings from Last 3 Encounters:  10/19/14 226 lb 1.6 oz (102.558 kg)  10/16/14 224 lb (101.606 kg)  11/06/13 227 lb (102.967 kg)     Hospital Course:  This patient is a 74 year old male who presented with dizziness and blurry vision which occurred while driving home from going out to eat. He was evaluated in the emergency room with a CT scan of the head which revealed no evidence of stroke. He had a CT angiogram of the head and neck and had no significant stenoses identified. He had no recurrence of symptoms after his initial symptoms lasted 30-45 minutes. He underwent an MRI of the brain which likewise revealed no stroke. He was evaluated by neurology who felt that his symptoms were related to hypotension. He has a chronically low blood pressure associated with his congestive heart failure and heart failure treatment. He has not had symptoms of chest pain or difficulty breathing. Neurologic status has remained at baseline. He has had some recent memory concerns and appears to likely have at least mild cognitive impairment. TSH is normal at 3.6.  Urinalysis revealed too numerous to count white cells and red cells. Culture is pending. He is treated with Cipro.  Condition at discharge is much improved. He'll be seen in follow-up in my office in one week. He will resume his usual medication regimen which has been reviewed in detail. His exam on discharge reveals clear lungs with a regular heart  rhythm and no focal weakness on neurologic exam.   Discharge Instructions     Medication List    TAKE these medications        aspirin EC 81 MG tablet  Take 81 mg by mouth daily.     atorvastatin 80 MG tablet  Commonly known as:  LIPITOR  Take 80 mg by mouth at bedtime.     carbamazepine 200 MG tablet  Commonly known as:  TEGRETOL  Take 400 mg by mouth 2 (two) times daily.     ciprofloxacin 500 MG tablet  Commonly known as:  CIPRO  Take 1 tablet (500 mg total) by mouth 2 (two) times daily.     furosemide 40 MG tablet  Commonly known as:  LASIX  Take 20 mg by mouth daily.     lisinopril 5 MG tablet  Commonly known as:  PRINIVIL,ZESTRIL  TAKE ONE TABLET BY MOUTH ONCE DAILY.     risperiDONE 1 MG tablet  Commonly known as:  RISPERDAL  Take 0.5-1.5 mg by mouth 2 (two) times daily. Takes half tablet in the morning and 1.5 tablet at night         [name] 10/21/2014

## 2014-10-21 NOTE — Progress Notes (Signed)
Reviewed discharge instructions including Stroke Education with pt and sister.  Answered all questions at this time.  Pt discharged home with sister.

## 2014-10-22 LAB — URINE CULTURE

## 2014-10-22 LAB — HOMOCYSTEINE: Homocysteine: 12.3 umol/L (ref 0.0–15.0)

## 2014-10-27 DIAGNOSIS — R42 Dizziness and giddiness: Secondary | ICD-10-CM | POA: Diagnosis not present

## 2014-10-27 DIAGNOSIS — Z6830 Body mass index (BMI) 30.0-30.9, adult: Secondary | ICD-10-CM | POA: Diagnosis not present

## 2014-10-27 DIAGNOSIS — N390 Urinary tract infection, site not specified: Secondary | ICD-10-CM | POA: Diagnosis not present

## 2014-11-06 ENCOUNTER — Encounter (HOSPITAL_COMMUNITY): Payer: Self-pay | Admitting: *Deleted

## 2014-11-06 ENCOUNTER — Emergency Department (HOSPITAL_COMMUNITY)
Admission: EM | Admit: 2014-11-06 | Discharge: 2014-11-06 | Disposition: A | Discharge status: Home / Self Care | Payer: Medicare Other | Attending: Emergency Medicine | Admitting: Emergency Medicine

## 2014-11-06 DIAGNOSIS — Z8669 Personal history of other diseases of the nervous system and sense organs: Secondary | ICD-10-CM | POA: Diagnosis not present

## 2014-11-06 DIAGNOSIS — I10 Essential (primary) hypertension: Secondary | ICD-10-CM | POA: Diagnosis not present

## 2014-11-06 DIAGNOSIS — Z7982 Long term (current) use of aspirin: Secondary | ICD-10-CM | POA: Insufficient documentation

## 2014-11-06 DIAGNOSIS — Z79899 Other long term (current) drug therapy: Secondary | ICD-10-CM | POA: Insufficient documentation

## 2014-11-06 DIAGNOSIS — E785 Hyperlipidemia, unspecified: Secondary | ICD-10-CM | POA: Insufficient documentation

## 2014-11-06 DIAGNOSIS — E871 Hypo-osmolality and hyponatremia: Secondary | ICD-10-CM | POA: Insufficient documentation

## 2014-11-06 DIAGNOSIS — F319 Bipolar disorder, unspecified: Secondary | ICD-10-CM | POA: Diagnosis not present

## 2014-11-06 DIAGNOSIS — R300 Dysuria: Secondary | ICD-10-CM | POA: Diagnosis present

## 2014-11-06 DIAGNOSIS — I2510 Atherosclerotic heart disease of native coronary artery without angina pectoris: Secondary | ICD-10-CM | POA: Diagnosis not present

## 2014-11-06 DIAGNOSIS — J45909 Unspecified asthma, uncomplicated: Secondary | ICD-10-CM | POA: Insufficient documentation

## 2014-11-06 DIAGNOSIS — Z951 Presence of aortocoronary bypass graft: Secondary | ICD-10-CM | POA: Insufficient documentation

## 2014-11-06 DIAGNOSIS — R339 Retention of urine, unspecified: Secondary | ICD-10-CM | POA: Insufficient documentation

## 2014-11-06 DIAGNOSIS — Z87891 Personal history of nicotine dependence: Secondary | ICD-10-CM | POA: Diagnosis not present

## 2014-11-06 DIAGNOSIS — I252 Old myocardial infarction: Secondary | ICD-10-CM | POA: Insufficient documentation

## 2014-11-06 DIAGNOSIS — I4891 Unspecified atrial fibrillation: Secondary | ICD-10-CM | POA: Insufficient documentation

## 2014-11-06 DIAGNOSIS — R338 Other retention of urine: Secondary | ICD-10-CM

## 2014-11-06 LAB — URINE MICROSCOPIC-ADD ON

## 2014-11-06 LAB — URINALYSIS, ROUTINE W REFLEX MICROSCOPIC
Bilirubin Urine: NEGATIVE
GLUCOSE, UA: NEGATIVE mg/dL
Ketones, ur: NEGATIVE mg/dL
LEUKOCYTES UA: NEGATIVE
Nitrite: NEGATIVE
PROTEIN: NEGATIVE mg/dL
Specific Gravity, Urine: 1.015 (ref 1.005–1.030)
Urobilinogen, UA: 0.2 mg/dL (ref 0.0–1.0)
pH: 6 (ref 5.0–8.0)

## 2014-11-06 LAB — CBC WITH DIFFERENTIAL/PLATELET
BASOS ABS: 0 10*3/uL (ref 0.0–0.1)
Basophils Relative: 0 %
Eosinophils Absolute: 0 10*3/uL (ref 0.0–0.7)
Eosinophils Relative: 0 %
HEMATOCRIT: 37.6 % — AB (ref 39.0–52.0)
HEMOGLOBIN: 12.9 g/dL — AB (ref 13.0–17.0)
LYMPHS PCT: 3 %
Lymphs Abs: 0.3 10*3/uL — ABNORMAL LOW (ref 0.7–4.0)
MCH: 30.7 pg (ref 26.0–34.0)
MCHC: 34.3 g/dL (ref 30.0–36.0)
MCV: 89.5 fL (ref 78.0–100.0)
MONO ABS: 0.5 10*3/uL (ref 0.1–1.0)
Monocytes Relative: 5 %
NEUTROS ABS: 9 10*3/uL — AB (ref 1.7–7.7)
NEUTROS PCT: 92 %
Platelets: 163 10*3/uL (ref 150–400)
RBC: 4.2 MIL/uL — AB (ref 4.22–5.81)
RDW: 12.4 % (ref 11.5–15.5)
WBC: 9.8 10*3/uL (ref 4.0–10.5)

## 2014-11-06 LAB — BASIC METABOLIC PANEL
Anion gap: 5 (ref 5–15)
BUN: 17 mg/dL (ref 6–20)
CHLORIDE: 93 mmol/L — AB (ref 101–111)
CO2: 26 mmol/L (ref 22–32)
Calcium: 8.5 mg/dL — ABNORMAL LOW (ref 8.9–10.3)
Creatinine, Ser: 0.95 mg/dL (ref 0.61–1.24)
GFR calc Af Amer: >60 mL/min (ref >60)
GFR calc non Af Amer: >60 mL/min (ref >60)
GLUCOSE: 122 mg/dL — AB (ref 65–99)
POTASSIUM: 4.8 mmol/L (ref 3.5–5.1)
Sodium: 124 mmol/L — ABNORMAL LOW (ref 135–145)

## 2014-11-06 NOTE — Discharge Instructions (Signed)
Acute Urinary Retention, Male °Acute urinary retention is the temporary inability to urinate. °This is a common problem in older men. As men age their prostates become larger and block the flow of urine from the bladder. This is usually a problem that has come on gradually.  °HOME CARE INSTRUCTIONS °If you are sent home with a Foley catheter and a drainage system, you will need to discuss the best course of action with your health care provider. While the catheter is in, maintain a good intake of fluids. Keep the drainage bag emptied and lower than your catheter. This is so that contaminated urine will not flow back into your bladder, which could lead to a urinary tract infection. °There are two main types of drainage bags. One is a large bag that usually is used at night. It has a good capacity that will allow you to sleep through the night without having to empty it. The second type is called a leg bag. It has a smaller capacity, so it needs to be emptied more frequently. However, the main advantage is that it can be attached by a leg strap and can go underneath your clothing, allowing you the freedom to move about or leave your home. °Only take over-the-counter or prescription medicines for pain, discomfort, or fever as directed by your health care provider.  °SEEK MEDICAL CARE IF: °· You develop a low-grade fever. °· You experience spasms or leakage of urine with the spasms. °SEEK IMMEDIATE MEDICAL CARE IF:  °· You develop chills or fever. °· Your catheter stops draining urine. °· Your catheter falls out. °· You start to develop increased bleeding that does not respond to rest and increased fluid intake. °MAKE SURE YOU: °· Understand these instructions. °· Will watch your condition. °· Will get help right away if you are not doing well or get worse. °  °This information is not intended to replace advice given to you by your health care provider. Make sure you discuss any questions you have with your health care  provider. °  °Document Released: 04/04/2000 Document Revised: 05/13/2014 Document Reviewed: 06/07/2012 °Elsevier Interactive Patient Education ©2016 [employer] ° °

## 2014-11-06 NOTE — ED Notes (Signed)
Pt c/o groin pain and painful urination that started Tuesday and has become worse today,

## 2014-11-06 NOTE — ED Provider Notes (Signed)
CSN: [id number]     Arrival date & time 11/06/14  1929 History   First MD Initiated Contact with Patient 11/06/14 1937     Chief Complaint  Patient presents with  . Dysuria    HPI Patient presents to emergency room with complaints of painful urination. Symptoms started a little bit on Tuesday but today they became more severe. Patient states it stings and burns when he urinates. Today however he has not been able to urinate much. Last time he was able to urinate was in the morning. Denies any fevers or chills. No vomiting or diarrhea. He does feel like his bladder is little bit distended Past Medical History  Diagnosis Date  . Coronary atherosclerosis of native coronary artery     Multivessel s/p CABG in 06/2001 post-IMI, LVEF 35% up to 50% postoperatively;  . Essential hypertension, benign   . Hyperlipidemia   . Sleep apnea   . History of bipolar disorder   . Allergic rhinitis   . Asthma   . Erectile dysfunction   . Atrial fibrillation ([place])     Remote history - WARCEF  . Myocardial infarction [facility])    Past Surgical History  Procedure Laterality Date  . Coronary artery bypass graft  08/2000    Dr. Servando Snare - LIMA to LAD, SVG to diagonal, SVG to PDA  . Tonsillectomy    . Circumcision  10/2003  . Colonoscopy N/A 07/11/2012    Procedure: COLONOSCOPY;  Surgeon: Rogene Houston, MD;  [facility];  Service: Endoscopy;  Laterality: N/A;  830-moved to 53 Ann to notify pt   Family History  Problem Relation Age of Onset  . Asthma Mother   . Heart attack Father 56  . Diabetes Brother    Social History  Substance Use Topics  . Smoking status: Former Smoker -- 1.00 packs/day for 15 years    Types: Cigarettes    Quit date: 01/11/1971  . Smokeless tobacco: None  . Alcohol Use: No    Review of Systems  All other systems reviewed and are negative.     Allergies  Review of patient's allergies indicates no known allergies.  Home Medications   Prior to Admission  medications   Medication Sig Start Date End Date Taking? Authorizing Provider  aspirin EC 81 MG tablet Take 81 mg by mouth daily.   Yes Historical Provider, MD  atorvastatin (LIPITOR) 80 MG tablet Take 80 mg by mouth at bedtime.     Yes Historical Provider, MD  carbamazepine (TEGRETOL) 200 MG tablet Take 400 mg by mouth 2 (two) times daily.    Yes Historical Provider, MD  furosemide (LASIX) 20 MG tablet Take 20 mg by mouth daily.   Yes Historical Provider, MD  lisinopril (PRINIVIL,ZESTRIL) 5 MG tablet TAKE ONE TABLET BY MOUTH ONCE DAILY. 09/05/14  Yes [name], MD  risperiDONE (RISPERDAL) 1 MG tablet Take 0.5-1.5 mg by mouth 2 (two) times daily. Takes half tablet in the morning and 1.5 tablet at night   Yes Historical Provider, MD  ciprofloxacin (CIPRO) 500 MG tablet Take 1 tablet (500 mg total) by mouth 2 (two) times daily. Patient not taking: Reported on 11/06/2014 10/21/14   [name], MD   BP 114/74 mmHg  Pulse 108  Temp(Src) 97.9 F (36.6 C) (Oral)  Resp 20  Ht 5\' 11"  (1.803 m)  Wt 226 lb (102.513 kg)  BMI 31.53 kg/m2  SpO2 97% Physical Exam  Constitutional: He appears well-developed and well-nourished. No distress.  HENT:  Head: Normocephalic and atraumatic.  Right Ear: External ear normal.  Left Ear: External ear normal.  Eyes: Conjunctivae are normal. Right eye exhibits no discharge. Left eye exhibits no discharge. No scleral icterus.  Neck: Neck supple. No tracheal deviation present.  Cardiovascular: Normal rate, regular rhythm and intact distal pulses.   Pulmonary/Chest: Effort normal and breath sounds normal. No stridor. No respiratory distress. He has no wheezes. He has no rales.  Abdominal: Soft. Bowel sounds are normal. He exhibits no distension. There is tenderness (Mild in the suprapubic region). There is no rebound and no guarding.  Musculoskeletal: He exhibits no edema or tenderness.  Neurological: He is alert. He has normal strength. No cranial nerve deficit  (no facial droop, extraocular movements intact, no slurred speech) or sensory deficit. He exhibits normal muscle tone. He displays no seizure activity. Coordination normal.  Skin: Skin is warm and dry. No rash noted.  Psychiatric: He has a normal mood and affect.  Nursing note and vitals reviewed.   ED Course  Procedures (including critical care time) Labs Review Labs Reviewed  URINALYSIS, ROUTINE W REFLEX MICROSCOPIC (NOT AT [facility]) - Abnormal; Notable for the following:    Hgb urine dipstick LARGE (*)    All other components within normal limits  CBC WITH DIFFERENTIAL/PLATELET - Abnormal; Notable for the following:    RBC 4.20 (*)    Hemoglobin 12.9 (*)    HCT 37.6 (*)    Neutro Abs 9.0 (*)    Lymphs Abs 0.3 (*)    All other components within normal limits  BASIC METABOLIC PANEL - Abnormal; Notable for the following:    Sodium 124 (*)    Chloride 93 (*)    Glucose, Bld 122 (*)    Calcium 8.5 (*)    All other components within normal limits  URINE MICROSCOPIC-ADD ON - Abnormal; Notable for the following:    Bacteria, UA FEW (*)    All other components within normal limits     MDM   Final diagnoses:  Acute urinary retention  Hyponatremia    Foley catheter was placed. The patient had over 1000 mL of urine. Urinalysis does not suggest an infection. Follow up with urology  Patient does have history of hyponatremia. This is worse today.May  be related to his urinary retention. Patient is otherwise asymptomatic. Think he can safely follow-up with his primary care doctor. He has a planned appointment tomorrow.   Dorie Rank, MD 11/06/14 [phone]

## 2014-11-07 DIAGNOSIS — R339 Retention of urine, unspecified: Secondary | ICD-10-CM | POA: Diagnosis not present

## 2014-11-07 DIAGNOSIS — E871 Hypo-osmolality and hyponatremia: Secondary | ICD-10-CM | POA: Diagnosis not present

## 2014-11-12 ENCOUNTER — Ambulatory Visit (INDEPENDENT_AMBULATORY_CARE_PROVIDER_SITE_OTHER): Payer: Medicare Other | Admitting: Urology

## 2014-11-12 DIAGNOSIS — R351 Nocturia: Secondary | ICD-10-CM

## 2014-11-12 DIAGNOSIS — N401 Enlarged prostate with lower urinary tract symptoms: Secondary | ICD-10-CM | POA: Diagnosis not present

## 2014-11-12 DIAGNOSIS — R3915 Urgency of urination: Secondary | ICD-10-CM | POA: Diagnosis not present

## 2014-11-12 DIAGNOSIS — N200 Calculus of kidney: Secondary | ICD-10-CM

## 2014-11-13 DIAGNOSIS — N210 Calculus in bladder: Secondary | ICD-10-CM | POA: Diagnosis not present

## 2014-11-13 DIAGNOSIS — N401 Enlarged prostate with lower urinary tract symptoms: Secondary | ICD-10-CM | POA: Diagnosis not present

## 2014-11-13 DIAGNOSIS — R351 Nocturia: Secondary | ICD-10-CM | POA: Diagnosis not present

## 2014-11-13 DIAGNOSIS — N200 Calculus of kidney: Secondary | ICD-10-CM | POA: Diagnosis not present

## 2014-11-13 DIAGNOSIS — R102 Pelvic and perineal pain: Secondary | ICD-10-CM | POA: Diagnosis not present

## 2014-11-13 DIAGNOSIS — R3915 Urgency of urination: Secondary | ICD-10-CM | POA: Diagnosis not present

## 2014-11-19 ENCOUNTER — Ambulatory Visit (INDEPENDENT_AMBULATORY_CARE_PROVIDER_SITE_OTHER): Payer: Medicare Other | Admitting: Urology

## 2014-11-19 DIAGNOSIS — N401 Enlarged prostate with lower urinary tract symptoms: Secondary | ICD-10-CM | POA: Diagnosis not present

## 2014-11-19 DIAGNOSIS — R351 Nocturia: Secondary | ICD-10-CM | POA: Diagnosis not present

## 2014-11-20 DIAGNOSIS — R351 Nocturia: Secondary | ICD-10-CM | POA: Diagnosis not present

## 2014-11-20 DIAGNOSIS — R338 Other retention of urine: Secondary | ICD-10-CM | POA: Diagnosis not present

## 2014-11-20 DIAGNOSIS — R3915 Urgency of urination: Secondary | ICD-10-CM | POA: Diagnosis not present

## 2014-11-24 ENCOUNTER — Encounter (HOSPITAL_COMMUNITY): Payer: Self-pay | Admitting: *Deleted

## 2014-11-24 ENCOUNTER — Emergency Department (HOSPITAL_COMMUNITY)
Admission: EM | Admit: 2014-11-24 | Discharge: 2014-11-24 | Disposition: A | Discharge status: Home / Self Care | Payer: Medicare Other | Attending: Emergency Medicine | Admitting: Emergency Medicine

## 2014-11-24 DIAGNOSIS — J45909 Unspecified asthma, uncomplicated: Secondary | ICD-10-CM | POA: Diagnosis not present

## 2014-11-24 DIAGNOSIS — X58XXXA Exposure to other specified factors, initial encounter: Secondary | ICD-10-CM | POA: Insufficient documentation

## 2014-11-24 DIAGNOSIS — I2510 Atherosclerotic heart disease of native coronary artery without angina pectoris: Secondary | ICD-10-CM | POA: Insufficient documentation

## 2014-11-24 DIAGNOSIS — Y9289 Other specified places as the place of occurrence of the external cause: Secondary | ICD-10-CM | POA: Insufficient documentation

## 2014-11-24 DIAGNOSIS — E785 Hyperlipidemia, unspecified: Secondary | ICD-10-CM | POA: Diagnosis not present

## 2014-11-24 DIAGNOSIS — Y658 Other specified misadventures during surgical and medical care: Secondary | ICD-10-CM | POA: Diagnosis not present

## 2014-11-24 DIAGNOSIS — Z87438 Personal history of other diseases of male genital organs: Secondary | ICD-10-CM | POA: Insufficient documentation

## 2014-11-24 DIAGNOSIS — Z79899 Other long term (current) drug therapy: Secondary | ICD-10-CM | POA: Insufficient documentation

## 2014-11-24 DIAGNOSIS — T839XXA Unspecified complication of genitourinary prosthetic device, implant and graft, initial encounter: Secondary | ICD-10-CM

## 2014-11-24 DIAGNOSIS — T83098A Other mechanical complication of other indwelling urethral catheter, initial encounter: Secondary | ICD-10-CM | POA: Insufficient documentation

## 2014-11-24 DIAGNOSIS — Y9389 Activity, other specified: Secondary | ICD-10-CM | POA: Insufficient documentation

## 2014-11-24 DIAGNOSIS — Z8659 Personal history of other mental and behavioral disorders: Secondary | ICD-10-CM | POA: Insufficient documentation

## 2014-11-24 DIAGNOSIS — Z8669 Personal history of other diseases of the nervous system and sense organs: Secondary | ICD-10-CM | POA: Insufficient documentation

## 2014-11-24 DIAGNOSIS — I10 Essential (primary) hypertension: Secondary | ICD-10-CM | POA: Diagnosis not present

## 2014-11-24 DIAGNOSIS — Z87891 Personal history of nicotine dependence: Secondary | ICD-10-CM | POA: Insufficient documentation

## 2014-11-24 DIAGNOSIS — Z7982 Long term (current) use of aspirin: Secondary | ICD-10-CM | POA: Diagnosis not present

## 2014-11-24 DIAGNOSIS — I252 Old myocardial infarction: Secondary | ICD-10-CM | POA: Diagnosis not present

## 2014-11-24 DIAGNOSIS — Y998 Other external cause status: Secondary | ICD-10-CM | POA: Insufficient documentation

## 2014-11-24 DIAGNOSIS — R338 Other retention of urine: Secondary | ICD-10-CM | POA: Diagnosis not present

## 2014-11-24 NOTE — ED Notes (Signed)
Patient c/o catheter leaking, states he want to urology doctor today. Has cath due to urinary retention.

## 2014-11-24 NOTE — ED Notes (Signed)
Patient came in with leg back from urinary catheter that was placed by PCP. States urine has been leaking from catheter. Complains of "my pajama leg was really wet when I woke up this morning." Denies pain, denies obvious blood in urine.

## 2014-11-24 NOTE — Discharge Instructions (Signed)
Foley Catheter Care, Adult °A Foley catheter is a soft, flexible tube that is placed into the bladder to drain urine. A Foley catheter may be inserted if: °· You leak urine or are not able to control when you urinate (urinary incontinence). °· You are not able to urinate when you need to (urinary retention). °· You had prostate surgery or surgery on the genitals. °· You have certain medical conditions, such as multiple sclerosis, dementia, or a spinal cord injury. °If you are going home with a Foley catheter in place, follow the instructions below. °TAKING CARE OF THE CATHETER °1. Wash your hands with soap and water. °2. Using mild soap and warm water on a clean washcloth: °¨ Clean the area on your body closest to the catheter insertion site using a circular motion, moving away from the catheter. Never wipe toward the catheter because this could sweep bacteria up into the urethra and cause infection. °¨ Remove all traces of soap. Pat the area dry with a clean towel. For males, reposition the foreskin. °3. Attach the catheter to your leg so there is no tension on the catheter. Use adhesive tape or a leg strap. If you are using adhesive tape, remove any sticky residue left behind by the previous tape you used. °4. Keep the drainage bag below the level of the bladder, but keep it off the floor. °5. Check throughout the day to be sure the catheter is working and urine is draining freely. Make sure the tubing does not become kinked. °6. Do not pull on the catheter or try to remove it. Pulling could damage internal tissues. °TAKING CARE OF THE DRAINAGE BAGS °You will be given two drainage bags to take home. One is a large overnight drainage bag, and the other is a smaller leg bag that fits underneath clothing. You may wear the overnight bag at any time, but you should never wear the smaller leg bag at night. Follow the instructions below for how to empty, change, and clean your drainage bags. °Emptying the Drainage  Bag °You must empty your drainage bag when it is  -½ full or at least 2-3 times a day. °1. Wash your hands with soap and water. °2. Keep the drainage bag below your hips, below the level of your bladder. This stops urine from going back into the tubing and into your bladder. °3. Hold the dirty bag over the toilet or a clean container. °4. Open the pour spout at the bottom of the bag and empty the urine into the toilet or container. Do not let the pour spout touch the toilet, container, or any other surface. Doing so can place bacteria on the bag, which can cause an infection. °5. Clean the pour spout with a gauze pad or cotton ball that has rubbing alcohol on it. °6. Close the pour spout. °7. Attach the bag to your leg with adhesive tape or a leg strap. °8. Wash your hands well. °Changing the Drainage Bag °Change your drainage bag once a month or sooner if it starts to smell bad or look dirty. Below are steps to follow when changing the drainage bag. °1. Wash your hands with soap and water. °2. Pinch off the rubber catheter so that urine does not spill out. °3. Disconnect the catheter tube from the drainage tube at the connection valve. Do not let the tubes touch any surface. °4. Clean the end of the catheter tube with an alcohol wipe. Use a different alcohol wipe to clean   the end of the drainage tube. °5. Connect the catheter tube to the drainage tube of the clean drainage bag. °6. Attach the new bag to the leg with adhesive tape or a leg strap. Avoid attaching the new bag too tightly. °7. Wash your hands well. °Cleaning the Drainage Bag °1. Wash your hands with soap and water. °2. Wash the bag in warm, soapy water. °3. Rinse the bag thoroughly with warm water. °4. Fill the bag with a solution of white vinegar and water (1 cup vinegar to 1 qt warm water [.2 L vinegar to 1 L warm water]). Close the bag and soak it for 30 minutes in the solution. °5. Rinse the bag with warm water. °6. Hang the bag to dry with the  pour spout open and hanging downward. °7. Store the clean bag (once it is dry) in a clean plastic bag. °8. Wash your hands well. °PREVENTING INFECTION °· Wash your hands before and after handling your catheter. °· Take showers daily and wash the area where the catheter enters your body. Do not take baths. Replace wet leg straps with dry ones, if this applies. °· Do not use powders, sprays, or lotions on the genital area. Only use creams, lotions, or ointments as directed by your caregiver. °· For females, wipe from front to back after each bowel movement. °· Drink enough fluids to keep your urine clear or pale yellow unless you have a fluid restriction. °· Do not let the drainage bag or tubing touch or lie on the floor. °· Wear cotton underwear to absorb moisture and to keep your skin drier. °SEEK MEDICAL CARE IF:  °· Your urine is cloudy or smells unusually bad. °· Your catheter becomes clogged. °· You are not draining urine into the bag or your bladder feels full. °· Your catheter starts to leak. °SEEK IMMEDIATE MEDICAL CARE IF:  °· You have pain, swelling, redness, or pus where the catheter enters the body. °· You have pain in the abdomen, legs, lower back, or bladder. °· You have a fever. °· You see blood fill the catheter, or your urine is pink or red. °· You have nausea, vomiting, or chills. °· Your catheter gets pulled out. °MAKE SURE YOU:  °· Understand these instructions. °· Will watch your condition. °· Will get help right away if you are not doing well or get worse. °  °This information is not intended to replace advice given to you by your health care provider. Make sure you discuss any questions you have with your health care provider. °  °Document Released: 12/27/2004 Document Revised: 05/13/2013 Document Reviewed: 12/19/2011 °Elsevier Interactive Patient Education ©2016 [employer] ° °

## 2014-11-25 NOTE — ED Provider Notes (Signed)
CSN: [id number]     Arrival date & time 11/24/14  1842 History   First MD Initiated Contact with Patient 11/24/14 2046     Chief Complaint  Patient presents with  . leaking catheter      (Consider location/radiation/quality/duration/timing/severity/associated sxs/prior Treatment) The history is provided by the patient.   patient presents with leaking Foley catheter. Had a placed recently for urinary retention. Has had it changed a couple times. Has had leaking that appears to be coming from the junction of the catheter to the tubing. Or possibly from the leg bag. No abdominal pain. No fevers.  Past Medical History  Diagnosis Date  . Coronary atherosclerosis of native coronary artery     Multivessel s/p CABG in 06/2001 post-IMI, LVEF 35% up to 50% postoperatively;  . Essential hypertension, benign   . Hyperlipidemia   . Sleep apnea   . History of bipolar disorder   . Allergic rhinitis   . Asthma   . Erectile dysfunction   . Atrial fibrillation ([place])     Remote history - WARCEF  . Myocardial infarction [facility])    Past Surgical History  Procedure Laterality Date  . Coronary artery bypass graft  08/2000    Dr. Servando Snare - LIMA to LAD, SVG to diagonal, SVG to PDA  . Tonsillectomy    . Circumcision  10/2003  . Colonoscopy N/A 07/11/2012    Procedure: COLONOSCOPY;  Surgeon: Rogene Houston, MD;  Location[facility];  Service: Endoscopy;  Laterality: N/A;  830-moved to 62 Ann to notify pt   Family History  Problem Relation Age of Onset  . Asthma Mother   . Heart attack Father 34  . Diabetes Brother    Social History  Substance Use Topics  . Smoking status: Former Smoker -- 1.00 packs/day for 15 years    Types: Cigarettes    Quit date: 01/11/1971  . Smokeless tobacco: None  . Alcohol Use: No    Review of Systems  Constitutional: Negative for fever.  Genitourinary: Negative for dysuria, difficulty urinating and testicular pain.      Allergies  Review of patient's  allergies indicates no known allergies.  Home Medications   Prior to Admission medications   Medication Sig Start Date End Date Taking? Authorizing Provider  aspirin EC 81 MG tablet Take 81 mg by mouth daily.   Yes Historical Provider, MD  atorvastatin (LIPITOR) 80 MG tablet Take 80 mg by mouth at bedtime.     Yes Historical Provider, MD  carbamazepine (TEGRETOL) 200 MG tablet Take 400 mg by mouth 2 (two) times daily.    Yes Historical Provider, MD  furosemide (LASIX) 20 MG tablet Take 20 mg by mouth daily.   Yes Historical Provider, MD  lisinopril (PRINIVIL,ZESTRIL) 5 MG tablet TAKE ONE TABLET BY MOUTH ONCE DAILY. 09/05/14  Yes [name], MD  risperiDONE (RISPERDAL) 1 MG tablet Take 0.5-1.5 mg by mouth 2 (two) times daily. Takes half tablet in the morning and 1.5 tablet at night   Yes Historical Provider, MD  tamsulosin (FLOMAX) 0.4 MG CAPS capsule Take 0.4 mg by mouth every morning.  11/07/14  Yes Historical Provider, MD  ciprofloxacin (CIPRO) 500 MG tablet Take 1 tablet (500 mg total) by mouth 2 (two) times daily. Patient not taking: Reported on 11/06/2014 10/21/14   [name], MD   BP 131/96 mmHg  Pulse 99  Temp(Src) 97.9 F (36.6 C) (Oral)  Resp 18  Ht 6' (1.829 m)  Wt 227 lb (  102.967 kg)  BMI 30.78 kg/m2  SpO2 96% Physical Exam  Constitutional: He appears well-developed.  Abdominal: He exhibits no mass.  Genitourinary:  Foley catheter in place with no leaking around it. Urine in bag.    ED Course  Procedures (including critical care time) Labs Review Labs Reviewed - No data to display  Imaging Review No results found. I have personally reviewed and evaluated these images and lab results as part of my medical decision-making.   EKG Interpretation None      MDM   Final diagnoses:  Foley catheter problem, initial encounter [facility])    Patient has Foley catheter dysfunction. Patient may have leaking between the catheter and the bag.  rplaced. 2 follow-up as  needed. He has urology follow-up.    Davonna Belling, MD 11/25/14 2217

## 2014-12-02 DIAGNOSIS — R3915 Urgency of urination: Secondary | ICD-10-CM | POA: Diagnosis not present

## 2014-12-02 DIAGNOSIS — N401 Enlarged prostate with lower urinary tract symptoms: Secondary | ICD-10-CM | POA: Diagnosis not present

## 2014-12-02 DIAGNOSIS — N312 Flaccid neuropathic bladder, not elsewhere classified: Secondary | ICD-10-CM | POA: Diagnosis not present

## 2014-12-05 ENCOUNTER — Encounter (HOSPITAL_COMMUNITY): Payer: Self-pay | Admitting: Emergency Medicine

## 2014-12-05 ENCOUNTER — Emergency Department (HOSPITAL_COMMUNITY)
Admission: EM | Admit: 2014-12-05 | Discharge: 2014-12-05 | Disposition: A | Discharge status: Home / Self Care | Payer: Medicare Other | Attending: Emergency Medicine | Admitting: Emergency Medicine

## 2014-12-05 DIAGNOSIS — I10 Essential (primary) hypertension: Secondary | ICD-10-CM | POA: Insufficient documentation

## 2014-12-05 DIAGNOSIS — I252 Old myocardial infarction: Secondary | ICD-10-CM | POA: Insufficient documentation

## 2014-12-05 DIAGNOSIS — I2510 Atherosclerotic heart disease of native coronary artery without angina pectoris: Secondary | ICD-10-CM | POA: Insufficient documentation

## 2014-12-05 DIAGNOSIS — Z7982 Long term (current) use of aspirin: Secondary | ICD-10-CM | POA: Insufficient documentation

## 2014-12-05 DIAGNOSIS — F3160 Bipolar disorder, current episode mixed, unspecified: Secondary | ICD-10-CM | POA: Insufficient documentation

## 2014-12-05 DIAGNOSIS — J45909 Unspecified asthma, uncomplicated: Secondary | ICD-10-CM | POA: Insufficient documentation

## 2014-12-05 DIAGNOSIS — E785 Hyperlipidemia, unspecified: Secondary | ICD-10-CM | POA: Insufficient documentation

## 2014-12-05 DIAGNOSIS — Z79899 Other long term (current) drug therapy: Secondary | ICD-10-CM | POA: Diagnosis not present

## 2014-12-05 DIAGNOSIS — R339 Retention of urine, unspecified: Secondary | ICD-10-CM | POA: Diagnosis present

## 2014-12-05 DIAGNOSIS — Z87891 Personal history of nicotine dependence: Secondary | ICD-10-CM | POA: Diagnosis not present

## 2014-12-05 DIAGNOSIS — F419 Anxiety disorder, unspecified: Secondary | ICD-10-CM | POA: Diagnosis not present

## 2014-12-05 HISTORY — DX: Depression, unspecified: F32.A

## 2014-12-05 HISTORY — DX: Major depressive disorder, single episode, unspecified: F32.9

## 2014-12-05 MED ORDER — TEMAZEPAM 15 MG PO CAPS: 15.0000 mg | ORAL_CAPSULE | Freq: Every evening | ORAL | Status: DC | PRN

## 2014-12-05 NOTE — ED Provider Notes (Signed)
CSN: [id number]     Arrival date & time 12/05/14  1120 History   First MD Initiated Contact with Patient 12/05/14 1440     Chief Complaint  Patient presents with  . Manic Behavior  . Depression  . Urinary Retention     (Consider location/radiation/quality/duration/timing/severity/associated sxs/prior Treatment) HPI Comments: 74 y.o. Male with history of CAD, HTN, hyperlipidemia, atrial fibrillation presents for bipolar disorder and depression.  The patient reports that he has a long standing history of both and that for over a week now he has been having racing thoughts, depressed mood, and has not been able to sleep.  He states that his biggest issue at present is his inability to sleep.  He saw his psychiatrist for this issue about 1 week ago and his Risperidone was increased and he says he has been taking his medications as prescribed but that his symptoms feel like they are getting worse.  He denies suicidal or homicidal ideation.  Does not feel he needs inpatient help but called his psychiatrist office today and could not get in touch with anyone and so brought himself in here.  He also reports he has had lots of anxiety about a newly placed foley catheter that he was told he will likely need forever.  He said today he worked himself up over it and thought it was not working because it did not have any urine in it but reports just before my evaluation he emptied a large bag with clear, yellow urine.     Past Medical History  Diagnosis Date  . Coronary atherosclerosis of native coronary artery     Multivessel s/p CABG in 06/2001 post-IMI, LVEF 35% up to 50% postoperatively;  . Essential hypertension, benign   . Hyperlipidemia   . Sleep apnea   . History of bipolar disorder   . Allergic rhinitis   . Asthma   . Erectile dysfunction   . Atrial fibrillation ([place])     Remote history - WARCEF  . Myocardial infarction ([place])   . Bipolar 1 disorder ([place])   . Depression    Past Surgical  History  Procedure Laterality Date  . Coronary artery bypass graft  08/2000    Dr. Servando Snare - LIMA to LAD, SVG to diagonal, SVG to PDA  . Tonsillectomy    . Circumcision  10/2003  . Colonoscopy N/A 07/11/2012    Procedure: COLONOSCOPY;  Surgeon: Rogene Houston, MD;  [facility];  Service: Endoscopy;  Laterality: N/A;  830-moved to 41 Ann to notify pt   Family History  Problem Relation Age of Onset  . Asthma Mother   . Heart attack Father 85  . Diabetes Brother    Social History  Substance Use Topics  . Smoking status: Former Smoker -- 1.00 packs/day for 15 years    Types: Cigarettes    Quit date: 01/11/1971  . Smokeless tobacco: None  . Alcohol Use: No    Review of Systems  Constitutional: Positive for fatigue. Negative for chills, diaphoresis and appetite change.  Eyes: Negative for pain and visual disturbance.  Respiratory: Negative for shortness of breath.   Gastrointestinal: Negative for abdominal pain and rectal pain.  Genitourinary: Negative for flank pain.  Musculoskeletal: Negative for back pain.  Skin: Negative for rash and wound.  Neurological: Negative for dizziness and headaches.  Psychiatric/Behavioral: Positive for sleep disturbance and dysphoric mood. Negative for suicidal ideas, hallucinations and self-injury. The patient is nervous/anxious.       Allergies  Review of patient's allergies indicates no known allergies.  Home Medications   Prior to Admission medications   Medication Sig Start Date End Date Taking? Authorizing Provider  aspirin EC 81 MG tablet Take 81 mg by mouth daily.   Yes Historical Provider, MD  atorvastatin (LIPITOR) 80 MG tablet Take 80 mg by mouth at bedtime.     Yes Historical Provider, MD  carbamazepine (TEGRETOL) 200 MG tablet Take 400 mg by mouth 2 (two) times daily.    Yes Historical Provider, MD  furosemide (LASIX) 20 MG tablet Take 20 mg by mouth daily.   Yes Historical Provider, MD  lisinopril (PRINIVIL,ZESTRIL)  5 MG tablet TAKE ONE TABLET BY MOUTH ONCE DAILY. 09/05/14  Yes [name], MD  risperiDONE (RISPERDAL) 1 MG tablet Take 1-2 mg by mouth 2 (two) times daily. Takes 1 tablet in the morning Take 2 tablets at night   Yes Historical Provider, MD  ciprofloxacin (CIPRO) 500 MG tablet Take 1 tablet (500 mg total) by mouth 2 (two) times daily. Patient not taking: Reported on 11/06/2014 10/21/14   [name], MD  tamsulosin (FLOMAX) 0.4 MG CAPS capsule Take 0.4 mg by mouth every morning.  11/07/14   Historical Provider, MD   BP 129/81 mmHg  Pulse 81  Temp(Src) 98.1 F (36.7 C)  Ht 6' (1.829 m)  Wt 232 lb (105.235 kg)  BMI 31.46 kg/m2  SpO2 96% Physical Exam  Constitutional: He is oriented to person, place, and time. He appears well-developed and well-nourished. No distress.  HENT:  Head: Normocephalic and atraumatic.  Right Ear: External ear normal.  Left Ear: External ear normal.  Mouth/Throat: Oropharynx is clear and moist. No oropharyngeal exudate.  Eyes: EOM are normal. Pupils are equal, round, and reactive to light.  Neck: Normal range of motion. Neck supple.  Cardiovascular: Normal rate and intact distal pulses.   Pulmonary/Chest: Effort normal. No respiratory distress. He has no wheezes. He has no rales.  Abdominal: Soft. He exhibits no distension. There is no tenderness.  Musculoskeletal: He exhibits no edema.  Neurological: He is alert and oriented to person, place, and time.  Skin: Skin is warm and dry. No rash noted. He is not diaphoretic.  Psychiatric: His mood appears anxious. He is not agitated, not aggressive, not hyperactive, not withdrawn and not actively hallucinating. Thought content is not paranoid and not delusional. He exhibits a depressed mood. He expresses no homicidal and no suicidal ideation. He expresses no suicidal plans and no homicidal plans. He is attentive.  Vitals reviewed.   ED Course  Procedures (including critical care time) Labs Review Labs  Reviewed - No data to display  Imaging Review No results found. I have personally reviewed and evaluated these images and lab results as part of my medical decision-making.   EKG Interpretation None      MDM  Patient seen and evaluated in stable condition.  Patient does not appear to be a risk of harm to himself or others.  Discussed case over the phone with Dr. Casimiro Needle the patient's psychiatrist who knew the patient well.  Dr. Casimiro Needle recommended adding Restoril to patient's treatment regimen and keeping other psych medications as is.  Discussed this with patient who was in agreement with plan.  Patient was discharged home in stable condition to follow up with Dr. Casimiro Needle and with strict return precautions.  All questions answered prior to discharge. Final diagnoses:  None    1. Bipolar disorder  2. Depression    Raquel Sarna  Sherral Hammers, MD 12/06/14 [phone]

## 2014-12-05 NOTE — Discharge Instructions (Signed)
You were seen today for your bipolar symptoms.  Continue your medications as prescribed.  Follow up with Dr. Casimiro Needle outpatient by calling next week to make an appointment.  Take your medications as prescribed and start taking the medication prescribed today for sleep.  If it does not work the first night Dr. Casimiro Needle says the first night you can take two.  Return immediately if you develop thoughts of suicide or of hurting someone else.  Bipolar Disorder Bipolar disorder is a mental illness. The term bipolar disorder actually is used to describe a group of disorders that all share varying degrees of emotional highs and lows that can interfere with daily functioning, such as work, school, or relationships. Bipolar disorder also can lead to drug abuse, hospitalization, and suicide. The emotional highs of bipolar disorder are periods of elation or irritability and high energy. These highs can range from a mild form (hypomania) to a severe form (mania). People experiencing episodes of hypomania may appear energetic, excitable, and highly productive. People experiencing mania may behave impulsively or erratically. They often make poor decisions. They may have difficulty sleeping. The most severe episodes of mania can involve having very distorted beliefs or perceptions about the world and seeing or hearing things that are not real (psychotic delusions and hallucinations).  The emotional lows of bipolar disorder (depression) also can range from mild to severe. Severe episodes of bipolar depression can involve psychotic delusions and hallucinations. Sometimes people with bipolar disorder experience a state of mixed mood. Symptoms of hypomania or mania and depression are both present during this mixed-mood episode. SIGNS AND SYMPTOMS There are signs and symptoms of the episodes of hypomania and mania as well as the episodes of depression. The signs and symptoms of hypomania and mania are similar but vary in  severity. They include:  Inflated self-esteem or feeling of increased self-confidence.  Decreased need for sleep.  Unusual talkativeness (rapid or pressured speech) or the feeling of a need to keep talking.  Sensation of racing thoughts or constant talking, with quick shifts between topics that may or may not be related (flight of ideas).  Decreased ability to focus or concentrate.  Increased purposeful activity, such as work, studies, or social activity, or nonproductive activity, such as pacing, squirming and fidgeting, or finger and toe tapping.  Impulsive behavior and use of poor judgment, resulting in high-risk activities, such as having unprotected sex or spending excessive amounts of money. Signs and symptoms of depression include the following:   Feelings of sadness, hopelessness, or helplessness.  Frequent or uncontrollable episodes of crying.  Lack of feeling anything or caring about anything.  Difficulty sleeping or sleeping too much.  Inability to enjoy the things you used to enjoy.   Desire to be alone all the time.   Feelings of guilt or worthlessness.  Lack of energy or motivation.   Difficulty concentrating, remembering, or making decisions.  Change in appetite or weight beyond normal fluctuations.  Thoughts of death or the desire to harm yourself. DIAGNOSIS  Bipolar disorder is diagnosed through an assessment by your caregiver. Your caregiver will ask questions about your emotional episodes. There are two main types of bipolar disorder. People with type I bipolar disorder have manic episodes with or without depressive episodes. People with type II bipolar disorder have hypomanic episodes and major depressive episodes, which are more serious than mild depression. The type of bipolar disorder you have can make an important difference in how your illness is monitored and treated. Your  caregiver may ask questions about your medical history and use of alcohol  or drugs, including prescription medication. Certain medical conditions and substances also can cause emotional highs and lows that resemble bipolar disorder (secondary bipolar disorder).  TREATMENT  Bipolar disorder is a long-term illness. It is best controlled with continuous treatment rather than treatment only when symptoms occur. The following treatments can be prescribed for bipolar disorders:  Medication--Medication can be prescribed by a doctor that is an expert in treating mental disorders (psychiatrists). Medications called mood stabilizers are usually prescribed to help control the illness. Other medications are sometimes added if symptoms of mania, depression, or psychotic delusions and hallucinations occur despite the use of a mood stabilizer.  Talk therapy--Some forms of talk therapy are helpful in providing support, education, and guidance. A combination of medication and talk therapy is best for managing the disorder over time. A procedure in which electricity is applied to your brain through your scalp (electroconvulsive therapy) is used in cases of severe mania when medication and talk therapy do not work or work too slowly.   This information is not intended to replace advice given to you by your health care provider. Make sure you discuss any questions you have with your health care provider.   Document Released: 04/04/2000 Document Revised: 01/17/2014 Document Reviewed: 01/23/2012 Elsevier Interactive Patient Education [employer].

## 2014-12-05 NOTE — ED Notes (Signed)
Pt reports that he has had adequate urine output.

## 2014-12-05 NOTE — ED Notes (Signed)
Pt. Stated, I think my bipolar and depression had kicked in this morning , I thought I was going to have a panic atrtack.  Im not showing ay urine in my bag and I've drank a lot today.

## 2014-12-11 DIAGNOSIS — H538 Other visual disturbances: Secondary | ICD-10-CM | POA: Diagnosis not present

## 2014-12-11 DIAGNOSIS — H2512 Age-related nuclear cataract, left eye: Secondary | ICD-10-CM | POA: Diagnosis not present

## 2014-12-22 DIAGNOSIS — I2510 Atherosclerotic heart disease of native coronary artery without angina pectoris: Secondary | ICD-10-CM | POA: Diagnosis not present

## 2014-12-22 DIAGNOSIS — Z79899 Other long term (current) drug therapy: Secondary | ICD-10-CM | POA: Diagnosis not present

## 2014-12-22 DIAGNOSIS — I509 Heart failure, unspecified: Secondary | ICD-10-CM | POA: Diagnosis not present

## 2014-12-22 NOTE — Patient Instructions (Signed)
Grant Stevens  12/22/2014     @PREFPERIOPPHARMACY @   Your procedure is scheduled on 12/29/14.  Report to [facility] at 8:00 A.M.  Call this number if you have problems the morning of surgery:  [phone]   Remember:  Do not eat food or drink liquids after midnight.  Take these medicines the morning of surgery with A SIP OF WATER Tegretol, Lisinopril, Flomax, Risperdal   Do not wear jewelry, make-up or nail polish.  Do not wear lotions, powders, or perfumes.  You may wear deodorant.  Do not shave 48 hours prior to surgery.  Men may shave face and neck.  Do not bring valuables to the hospital.  [facility] is not responsible for any belongings or valuables.  Contacts, dentures or bridgework may not be worn into surgery.  Leave your suitcase in the car.  After surgery it may be brought to your room.  For patients admitted to the hospital, discharge time will be determined by your treatment team.  Patients discharged the day of surgery will not be allowed to drive home.    Please read over the following fact sheets that you were given. Anesthesia Post-op Instructions     PATIENT INSTRUCTIONS POST-ANESTHESIA  IMMEDIATELY FOLLOWING SURGERY:  Do not drive or operate machinery for the first twenty four hours after surgery.  Do not make any important decisions for twenty four hours after surgery or while taking narcotic pain medications or sedatives.  If you develop intractable nausea and vomiting or a severe headache please notify your doctor immediately.  FOLLOW-UP:  Please make an appointment with your surgeon as instructed. You do not need to follow up with anesthesia unless specifically instructed to do so.  WOUND CARE INSTRUCTIONS (if applicable):  Keep a dry clean dressing on the anesthesia/puncture wound site if there is drainage.  Once the wound has quit draining you may leave it open to air.  Generally you should leave the bandage intact for twenty four hours unless  there is drainage.  If the epidural site drains for more than 36-48 hours please call the anesthesia department.  QUESTIONS?:  Please feel free to call your physician or the hospital operator if you have any questions, and they will be happy to assist you.       A cataract is a clouding of the lens of the eye. When a lens becomes cloudy, vision is reduced based on the degree and nature of the clouding. Surgery may be needed to improve vision. Surgery removes the cloudy lens and usually replaces it with a substitute lens (intraocular lens, IOL). LET YOUR EYE DOCTOR KNOW ABOUT:  Allergies to food or medicine.  Medicines taken including herbs, eye drops, over-the-counter medicines, and creams.  Use of steroids (by mouth or creams).  Previous problems with anesthetics or numbing medicine.  History of bleeding problems or blood clots.  Previous surgery.  Other health problems, including diabetes and kidney problems.  Possibility of pregnancy, if this applies. RISKS AND COMPLICATIONS  Infection.  Inflammation of the eyeball (endophthalmitis) that can spread to both eyes (sympathetic ophthalmia).  Poor wound healing.  If an IOL is inserted, it can later fall out of proper position. This is very uncommon.  Clouding of the part of your eye that holds an IOL in place. This is called an "after-cataract." These are uncommon but easily treated. BEFORE THE PROCEDURE  Do not eat or drink anything except small amounts of water for 8 to 12 before  your surgery, or as directed by your caregiver.  Unless you are told otherwise, continue any eye drops you have been prescribed.  Talk to your primary caregiver about all other medicines that you take (both prescription and nonprescription). In some cases, you may need to stop or change medicines near the time of your surgery. This is most important if you are taking blood-thinning medicine.Do not stop medicines unless you are told to do  so.  Arrange for someone to drive you to and from the procedure.  Do not put contact lenses in either eye on the day of your surgery. PROCEDURE There is more than one method for safely removing a cataract. Your doctor can explain the differences and help determine which is best for you. Phacoemulsification surgery is the most common form of cataract surgery.  An injection is given behind the eye or eye drops are given to make this a painless procedure.  A small cut (incision) is made on the edge of the clear, dome-shaped surface that covers the front of the eye (cornea).  A tiny probe is painlessly inserted into the eye. This device gives off ultrasound waves that soften and break up the cloudy center of the lens. This makes it easier for the cloudy lens to be removed by suction.  An IOL may be implanted.  The normal lens of the eye is covered by a clear capsule. Part of that capsule is intentionally left in the eye to support the IOL.  Your surgeon may or may not use stitches to close the incision. There are other forms of cataract surgery that require a larger incision and stitches to close the eye. This approach is taken in cases where the doctor feels that the cataract cannot be easily removed using phacoemulsification. AFTER THE PROCEDURE  When an IOL is implanted, it does not need care. It becomes a permanent part of your eye and cannot be seen or felt.  Your doctor will schedule follow-up exams to check on your progress.  Review your other medicines with your doctor to see which can be resumed after surgery.  Use eye drops or take medicine as prescribed by your doctor.   This information is not intended to replace advice given to you by your health care provider. Make sure you discuss any questions you have with your health care provider.   Document Released: 12/16/2010 Document Revised: 01/17/2014 Document Reviewed: 12/16/2010 Elsevier Interactive Patient Education [employer]

## 2014-12-23 ENCOUNTER — Emergency Department (HOSPITAL_COMMUNITY)
Admission: EM | Admit: 2014-12-23 | Discharge: 2014-12-23 | Disposition: A | Discharge status: Home / Self Care | Payer: Medicare Other | Attending: Emergency Medicine | Admitting: Emergency Medicine

## 2014-12-23 ENCOUNTER — Encounter (HOSPITAL_COMMUNITY)
Admission: RE | Admit: 2014-12-23 | Discharge: 2014-12-23 | Disposition: A | Discharge status: Home / Self Care | Payer: Medicare Other | Source: Ambulatory Visit | Attending: Ophthalmology | Admitting: Ophthalmology

## 2014-12-23 ENCOUNTER — Encounter (HOSPITAL_COMMUNITY): Payer: Self-pay | Admitting: *Deleted

## 2014-12-23 ENCOUNTER — Encounter (HOSPITAL_COMMUNITY): Payer: Self-pay

## 2014-12-23 DIAGNOSIS — Z87438 Personal history of other diseases of male genital organs: Secondary | ICD-10-CM | POA: Diagnosis not present

## 2014-12-23 DIAGNOSIS — Z7982 Long term (current) use of aspirin: Secondary | ICD-10-CM | POA: Diagnosis not present

## 2014-12-23 DIAGNOSIS — I509 Heart failure, unspecified: Secondary | ICD-10-CM | POA: Diagnosis not present

## 2014-12-23 DIAGNOSIS — Z960 Presence of urogenital implants: Secondary | ICD-10-CM

## 2014-12-23 DIAGNOSIS — Z951 Presence of aortocoronary bypass graft: Secondary | ICD-10-CM | POA: Insufficient documentation

## 2014-12-23 DIAGNOSIS — F319 Bipolar disorder, unspecified: Secondary | ICD-10-CM | POA: Insufficient documentation

## 2014-12-23 DIAGNOSIS — Z01818 Encounter for other preprocedural examination: Secondary | ICD-10-CM | POA: Insufficient documentation

## 2014-12-23 DIAGNOSIS — I2510 Atherosclerotic heart disease of native coronary artery without angina pectoris: Secondary | ICD-10-CM | POA: Insufficient documentation

## 2014-12-23 DIAGNOSIS — H2512 Age-related nuclear cataract, left eye: Secondary | ICD-10-CM | POA: Diagnosis not present

## 2014-12-23 DIAGNOSIS — Z8669 Personal history of other diseases of the nervous system and sense organs: Secondary | ICD-10-CM | POA: Insufficient documentation

## 2014-12-23 DIAGNOSIS — Z79899 Other long term (current) drug therapy: Secondary | ICD-10-CM | POA: Insufficient documentation

## 2014-12-23 DIAGNOSIS — J45909 Unspecified asthma, uncomplicated: Secondary | ICD-10-CM | POA: Diagnosis not present

## 2014-12-23 DIAGNOSIS — T8384XA Pain from genitourinary prosthetic devices, implants and grafts, initial encounter: Secondary | ICD-10-CM | POA: Diagnosis not present

## 2014-12-23 DIAGNOSIS — E785 Hyperlipidemia, unspecified: Secondary | ICD-10-CM | POA: Insufficient documentation

## 2014-12-23 DIAGNOSIS — I10 Essential (primary) hypertension: Secondary | ICD-10-CM | POA: Diagnosis not present

## 2014-12-23 DIAGNOSIS — Z792 Long term (current) use of antibiotics: Secondary | ICD-10-CM | POA: Insufficient documentation

## 2014-12-23 DIAGNOSIS — R600 Localized edema: Secondary | ICD-10-CM | POA: Insufficient documentation

## 2014-12-23 DIAGNOSIS — I252 Old myocardial infarction: Secondary | ICD-10-CM | POA: Diagnosis not present

## 2014-12-23 DIAGNOSIS — M7989 Other specified soft tissue disorders: Secondary | ICD-10-CM | POA: Diagnosis present

## 2014-12-23 DIAGNOSIS — Y658 Other specified misadventures during surgical and medical care: Secondary | ICD-10-CM | POA: Diagnosis not present

## 2014-12-23 DIAGNOSIS — Z978 Presence of other specified devices: Secondary | ICD-10-CM

## 2014-12-23 LAB — CBC
HCT: 35.6 % — ABNORMAL LOW (ref 39.0–52.0)
Hemoglobin: 11.9 g/dL — ABNORMAL LOW (ref 13.0–17.0)
MCH: 30.7 pg (ref 26.0–34.0)
MCHC: 33.4 g/dL (ref 30.0–36.0)
MCV: 91.8 fL (ref 78.0–100.0)
PLATELETS: 209 10*3/uL (ref 150–400)
RBC: 3.88 MIL/uL — AB (ref 4.22–5.81)
RDW: 13.3 % (ref 11.5–15.5)
WBC: 8.4 10*3/uL (ref 4.0–10.5)

## 2014-12-23 NOTE — ED Notes (Signed)
Pt c/o swelling to hands and feet that became worse after scrubbing the floors, also leaking from leg bag that started tonight,

## 2014-12-23 NOTE — ED Notes (Signed)
Applied new leg bag, patent and secured to right leg.

## 2014-12-23 NOTE — Discharge Instructions (Signed)
Increase your lasix (furosemide) to 20 mg twice per day for the next three days and then go back to once per day.   Peripheral Edema You have swelling in your legs (peripheral edema). This swelling is due to excess accumulation of salt and water in your body. Edema may be a sign of heart, kidney or liver disease, or a side effect of a medication. It may also be due to problems in the leg veins. Elevating your legs and using special support stockings may be very helpful, if the cause of the swelling is due to poor venous circulation. Avoid long periods of standing, whatever the cause. Treatment of edema depends on identifying the cause. Chips, pretzels, pickles and other salty foods should be avoided. Restricting salt in your diet is almost always needed. Water pills (diuretics) are often used to remove the excess salt and water from your body via urine. These medicines prevent the kidney from reabsorbing sodium. This increases urine flow. Diuretic treatment may also result in lowering of potassium levels in your body. Potassium supplements may be needed if you have to use diuretics daily. Daily weights can help you keep track of your progress in clearing your edema. You should call your caregiver for follow up care as recommended. SEEK IMMEDIATE MEDICAL CARE IF:   You have increased swelling, pain, redness, or heat in your legs.  You develop shortness of breath, especially when lying down.  You develop chest or abdominal pain, weakness, or fainting.  You have a fever.   This information is not intended to replace advice given to you by your health care provider. Make sure you discuss any questions you have with your health care provider.   Document Released: 02/04/2004 Document Revised: 03/21/2011 Document Reviewed: 07/09/2014 Elsevier Interactive Patient Education [employer].

## 2014-12-24 ENCOUNTER — Encounter (HOSPITAL_COMMUNITY): Payer: Self-pay | Admitting: *Deleted

## 2014-12-24 ENCOUNTER — Emergency Department (HOSPITAL_COMMUNITY)
Admission: EM | Admit: 2014-12-24 | Discharge: 2014-12-24 | Disposition: A | Discharge status: Home / Self Care | Payer: Medicare Other | Attending: Emergency Medicine | Admitting: Emergency Medicine

## 2014-12-24 DIAGNOSIS — T83038A Leakage of other indwelling urethral catheter, initial encounter: Secondary | ICD-10-CM | POA: Insufficient documentation

## 2014-12-24 DIAGNOSIS — E785 Hyperlipidemia, unspecified: Secondary | ICD-10-CM | POA: Insufficient documentation

## 2014-12-24 DIAGNOSIS — I4891 Unspecified atrial fibrillation: Secondary | ICD-10-CM | POA: Diagnosis not present

## 2014-12-24 DIAGNOSIS — Z7982 Long term (current) use of aspirin: Secondary | ICD-10-CM | POA: Diagnosis not present

## 2014-12-24 DIAGNOSIS — Z79899 Other long term (current) drug therapy: Secondary | ICD-10-CM | POA: Insufficient documentation

## 2014-12-24 DIAGNOSIS — I2510 Atherosclerotic heart disease of native coronary artery without angina pectoris: Secondary | ICD-10-CM | POA: Diagnosis not present

## 2014-12-24 DIAGNOSIS — Z951 Presence of aortocoronary bypass graft: Secondary | ICD-10-CM | POA: Diagnosis not present

## 2014-12-24 DIAGNOSIS — Z792 Long term (current) use of antibiotics: Secondary | ICD-10-CM | POA: Insufficient documentation

## 2014-12-24 DIAGNOSIS — Z8669 Personal history of other diseases of the nervous system and sense organs: Secondary | ICD-10-CM | POA: Insufficient documentation

## 2014-12-24 DIAGNOSIS — F319 Bipolar disorder, unspecified: Secondary | ICD-10-CM | POA: Diagnosis not present

## 2014-12-24 DIAGNOSIS — Y658 Other specified misadventures during surgical and medical care: Secondary | ICD-10-CM | POA: Insufficient documentation

## 2014-12-24 DIAGNOSIS — Z87891 Personal history of nicotine dependence: Secondary | ICD-10-CM | POA: Diagnosis not present

## 2014-12-24 DIAGNOSIS — J45909 Unspecified asthma, uncomplicated: Secondary | ICD-10-CM | POA: Diagnosis not present

## 2014-12-24 DIAGNOSIS — R4182 Altered mental status, unspecified: Secondary | ICD-10-CM | POA: Diagnosis not present

## 2014-12-24 DIAGNOSIS — N390 Urinary tract infection, site not specified: Secondary | ICD-10-CM | POA: Diagnosis not present

## 2014-12-24 DIAGNOSIS — T839XXA Unspecified complication of genitourinary prosthetic device, implant and graft, initial encounter: Secondary | ICD-10-CM

## 2014-12-24 DIAGNOSIS — I252 Old myocardial infarction: Secondary | ICD-10-CM | POA: Diagnosis not present

## 2014-12-24 DIAGNOSIS — I10 Essential (primary) hypertension: Secondary | ICD-10-CM | POA: Diagnosis not present

## 2014-12-24 DIAGNOSIS — I509 Heart failure, unspecified: Secondary | ICD-10-CM | POA: Insufficient documentation

## 2014-12-24 DIAGNOSIS — T83098A Other mechanical complication of other indwelling urethral catheter, initial encounter: Secondary | ICD-10-CM | POA: Diagnosis not present

## 2014-12-24 NOTE — ED Provider Notes (Signed)
CSN: [id number]     Arrival date & time 12/24/14  0402 History   First MD Initiated Contact with Patient 12/24/14 [phone]     Chief Complaint  Patient presents with  . foley problems      (Consider location/radiation/quality/duration/timing/severity/associated sxs/prior Treatment) The history is provided by the patient.   74 year old male had been in emergency department yesterday and had his Foley catheter replaced. Seeming, he attached leg bag and states that he most of the 2 arteries started having some leaking of urine through the leg bag.  Past Medical History  Diagnosis Date  . Coronary atherosclerosis of native coronary artery     Multivessel s/p CABG in 06/2001 post-IMI, LVEF 35% up to 50% postoperatively;  . Essential hypertension, benign   . Hyperlipidemia   . Sleep apnea   . History of bipolar disorder   . Allergic rhinitis   . Asthma   . Erectile dysfunction   . Atrial fibrillation ([place])     Remote history - WARCEF  . Myocardial infarction ([place])   . Bipolar 1 disorder ([place])   . Depression   . CHF (congestive heart failure) [facility])    Past Surgical History  Procedure Laterality Date  . Coronary artery bypass graft  08/2000    Dr. Servando Snare - LIMA to LAD, SVG to diagonal, SVG to PDA  . Tonsillectomy    . Circumcision  10/2003  . Colonoscopy N/A 07/11/2012    Procedure: COLONOSCOPY;  Surgeon: Rogene Houston, MD;  Location: [facility] ENDO SUITE;  Service: Endoscopy;  Laterality: N/A;  830-moved to 63 Ann to notify pt   Family History  Problem Relation Age of Onset  . Asthma Mother   . Heart attack Father 10  . Diabetes Brother    Social History  Substance Use Topics  . Smoking status: Former Smoker -- 1.00 packs/day for 15 years    Types: Cigarettes    Quit date: 01/11/1971  . Smokeless tobacco: None  . Alcohol Use: No    Review of Systems  All other systems reviewed and are negative.     Allergies  Review of patient's allergies indicates no known  allergies.  Home Medications   Prior to Admission medications   Medication Sig Start Date End Date Taking? Authorizing Provider  ARIPiprazole (ABILIFY) 5 MG tablet Take 5 mg by mouth daily.   Yes Historical Provider, MD  aspirin EC 81 MG tablet Take 81 mg by mouth daily.   Yes Historical Provider, MD  atorvastatin (LIPITOR) 80 MG tablet Take 80 mg by mouth at bedtime.     Yes Historical Provider, MD  carbamazepine (TEGRETOL) 200 MG tablet Take 400 mg by mouth 2 (two) times daily.    Yes Historical Provider, MD  ciprofloxacin (CIPRO) 500 MG tablet Take 1 tablet (500 mg total) by mouth 2 (two) times daily. 10/21/14  Yes [name], MD  divalproex (DEPAKOTE) 250 MG DR tablet Take 250 mg by mouth See admin instructions. Take 1 tablet by mouth each morning for a week, then 2 tablets each morning,   Yes Historical Provider, MD  furosemide (LASIX) 20 MG tablet Take 20 mg by mouth daily.   Yes Historical Provider, MD  lisinopril (PRINIVIL,ZESTRIL) 5 MG tablet TAKE ONE TABLET BY MOUTH ONCE DAILY. 09/05/14  Yes [name], MD  risperiDONE (RISPERDAL) 1 MG tablet Take 1-2 mg by mouth 2 (two) times daily. Takes 1 tablet in the morning Take 2 tablets at night   Yes Historical Provider,  MD  tamsulosin (FLOMAX) 0.4 MG CAPS capsule Take 0.4 mg by mouth every morning.  11/07/14  Yes Historical Provider, MD  temazepam (RESTORIL) 15 MG capsule Take 1 capsule (15 mg total) by mouth at bedtime as needed for sleep. 12/05/14  Yes Harvel Quale, MD   BP 168/81 mmHg  Pulse 93  Temp(Src) 98.4 F (36.9 C) (Oral)  Resp 18  Ht 6' (1.829 m)  Wt 235 lb (106.595 kg)  BMI 31.86 kg/m2  SpO2 97% Physical Exam  Nursing note and vitals reviewed.  74 year old male, resting comfortably and in no acute distress. Vital signs are significant for hypertension. Oxygen saturation is 97%, which is normal. Head is normocephalic and atraumatic. PERRLA, EOMI. Oropharynx is clear. Neck is nontender and supple without  adenopathy or JVD. Back is nontender and there is no CVA tenderness. Lungs are clear without rales, wheezes, or rhonchi. Chest is nontender. Heart has regular rate and rhythm without murmur. Abdomen is soft, flat, nontender without masses or hepatosplenomegaly and peristalsis is normoactive. Genitalia: Circumcised penis with Foley catheter in place. Extremities have 2+ edema, full range of motion is present. Skin is warm and dry without rash. Neurologic: Mental status is normal, cranial nerves are intact, there are no motor or sensory deficits.  ED Course  Procedures (including critical care time)   MDM   Final diagnoses:  Foley catheter problem, initial encounter ([place])    Foley catheter is functioning well but leg bag does have a hole in it. Leg bag is replaced and he is discharged home. Old records are reviewed confirming ED visit for urinary retention and Foley catheter change.    Delora Fuel, MD [id number]

## 2014-12-24 NOTE — ED Notes (Signed)
Removed the leg bag & replaced. Bag leaking at leg strap & button.

## 2014-12-24 NOTE — Discharge Instructions (Signed)
Foley Catheter Care, Adult °A Foley catheter is a soft, flexible tube that is placed into the bladder to drain urine. A Foley catheter may be inserted if: °· You leak urine or are not able to control when you urinate (urinary incontinence). °· You are not able to urinate when you need to (urinary retention). °· You had prostate surgery or surgery on the genitals. °· You have certain medical conditions, such as multiple sclerosis, dementia, or a spinal cord injury. °If you are going home with a Foley catheter in place, follow the instructions below. °TAKING CARE OF THE CATHETER °1. Wash your hands with soap and water. °2. Using mild soap and warm water on a clean washcloth: °¨ Clean the area on your body closest to the catheter insertion site using a circular motion, moving away from the catheter. Never wipe toward the catheter because this could sweep bacteria up into the urethra and cause infection. °¨ Remove all traces of soap. Pat the area dry with a clean towel. For males, reposition the foreskin. °3. Attach the catheter to your leg so there is no tension on the catheter. Use adhesive tape or a leg strap. If you are using adhesive tape, remove any sticky residue left behind by the previous tape you used. °4. Keep the drainage bag below the level of the bladder, but keep it off the floor. °5. Check throughout the day to be sure the catheter is working and urine is draining freely. Make sure the tubing does not become kinked. °6. Do not pull on the catheter or try to remove it. Pulling could damage internal tissues. °TAKING CARE OF THE DRAINAGE BAGS °You will be given two drainage bags to take home. One is a large overnight drainage bag, and the other is a smaller leg bag that fits underneath clothing. You may wear the overnight bag at any time, but you should never wear the smaller leg bag at night. Follow the instructions below for how to empty, change, and clean your drainage bags. °Emptying the Drainage  Bag °You must empty your drainage bag when it is  -½ full or at least 2-3 times a day. °1. Wash your hands with soap and water. °2. Keep the drainage bag below your hips, below the level of your bladder. This stops urine from going back into the tubing and into your bladder. °3. Hold the dirty bag over the toilet or a clean container. °4. Open the pour spout at the bottom of the bag and empty the urine into the toilet or container. Do not let the pour spout touch the toilet, container, or any other surface. Doing so can place bacteria on the bag, which can cause an infection. °5. Clean the pour spout with a gauze pad or cotton ball that has rubbing alcohol on it. °6. Close the pour spout. °7. Attach the bag to your leg with adhesive tape or a leg strap. °8. Wash your hands well. °Changing the Drainage Bag °Change your drainage bag once a month or sooner if it starts to smell bad or look dirty. Below are steps to follow when changing the drainage bag. °1. Wash your hands with soap and water. °2. Pinch off the rubber catheter so that urine does not spill out. °3. Disconnect the catheter tube from the drainage tube at the connection valve. Do not let the tubes touch any surface. °4. Clean the end of the catheter tube with an alcohol wipe. Use a different alcohol wipe to clean   the end of the drainage tube. °5. Connect the catheter tube to the drainage tube of the clean drainage bag. °6. Attach the new bag to the leg with adhesive tape or a leg strap. Avoid attaching the new bag too tightly. °7. Wash your hands well. °Cleaning the Drainage Bag °1. Wash your hands with soap and water. °2. Wash the bag in warm, soapy water. °3. Rinse the bag thoroughly with warm water. °4. Fill the bag with a solution of white vinegar and water (1 cup vinegar to 1 qt warm water [.2 L vinegar to 1 L warm water]). Close the bag and soak it for 30 minutes in the solution. °5. Rinse the bag with warm water. °6. Hang the bag to dry with the  pour spout open and hanging downward. °7. Store the clean bag (once it is dry) in a clean plastic bag. °8. Wash your hands well. °PREVENTING INFECTION °· Wash your hands before and after handling your catheter. °· Take showers daily and wash the area where the catheter enters your body. Do not take baths. Replace wet leg straps with dry ones, if this applies. °· Do not use powders, sprays, or lotions on the genital area. Only use creams, lotions, or ointments as directed by your caregiver. °· For females, wipe from front to back after each bowel movement. °· Drink enough fluids to keep your urine clear or pale yellow unless you have a fluid restriction. °· Do not let the drainage bag or tubing touch or lie on the floor. °· Wear cotton underwear to absorb moisture and to keep your skin drier. °SEEK MEDICAL CARE IF:  °· Your urine is cloudy or smells unusually bad. °· Your catheter becomes clogged. °· You are not draining urine into the bag or your bladder feels full. °· Your catheter starts to leak. °SEEK IMMEDIATE MEDICAL CARE IF:  °· You have pain, swelling, redness, or pus where the catheter enters the body. °· You have pain in the abdomen, legs, lower back, or bladder. °· You have a fever. °· You see blood fill the catheter, or your urine is pink or red. °· You have nausea, vomiting, or chills. °· Your catheter gets pulled out. °MAKE SURE YOU:  °· Understand these instructions. °· Will watch your condition. °· Will get help right away if you are not doing well or get worse. °  °This information is not intended to replace advice given to you by your health care provider. Make sure you discuss any questions you have with your health care provider. °  °Document Released: 12/27/2004 Document Revised: 05/13/2013 Document Reviewed: 12/19/2011 °Elsevier Interactive Patient Education ©2016 [employer] ° °

## 2014-12-24 NOTE — ED Notes (Signed)
Pt alert & oriented x4, stable gait. Patient  given discharge instructions, paperwork & prescription(s). Patient verbalized understanding. Pt left department w/ no further questions. 

## 2014-12-24 NOTE — ED Notes (Signed)
Pt states leaking by bag.

## 2014-12-25 ENCOUNTER — Encounter (HOSPITAL_COMMUNITY): Payer: Self-pay | Admitting: *Deleted

## 2014-12-25 ENCOUNTER — Emergency Department (HOSPITAL_COMMUNITY)
Admission: EM | Admit: 2014-12-25 | Discharge: 2014-12-25 | Disposition: A | Discharge status: Home / Self Care | Payer: Medicare Other | Source: Home / Self Care | Attending: Emergency Medicine | Admitting: Emergency Medicine

## 2014-12-25 DIAGNOSIS — Z6832 Body mass index (BMI) 32.0-32.9, adult: Secondary | ICD-10-CM | POA: Diagnosis not present

## 2014-12-25 DIAGNOSIS — T839XXA Unspecified complication of genitourinary prosthetic device, implant and graft, initial encounter: Secondary | ICD-10-CM

## 2014-12-25 DIAGNOSIS — E871 Hypo-osmolality and hyponatremia: Secondary | ICD-10-CM | POA: Diagnosis not present

## 2014-12-25 DIAGNOSIS — N400 Enlarged prostate without lower urinary tract symptoms: Secondary | ICD-10-CM | POA: Diagnosis not present

## 2014-12-25 DIAGNOSIS — I5022 Chronic systolic (congestive) heart failure: Secondary | ICD-10-CM | POA: Diagnosis not present

## 2014-12-25 DIAGNOSIS — T83098A Other mechanical complication of other indwelling urethral catheter, initial encounter: Secondary | ICD-10-CM | POA: Diagnosis not present

## 2014-12-25 DIAGNOSIS — Z79899 Other long term (current) drug therapy: Secondary | ICD-10-CM | POA: Diagnosis not present

## 2014-12-25 NOTE — ED Provider Notes (Signed)
CSN: [id number]     Arrival date & time 12/25/14  1703 History   First MD Initiated Contact with Patient 12/25/14 1723     Chief Complaint  Patient presents with  . leaking foley      (Consider location/radiation/quality/duration/timing/severity/associated sxs/prior Treatment) HPI  Foley in place, broken leg bag yesterday, came here got it replaced. Noticed it was leaking around the catheter through his urethra today, came in for eval. No pain, back pain, bloody drainage. Also complains of getting an erection inappropriately.   Past Medical History  Diagnosis Date  . Coronary atherosclerosis of native coronary artery     Multivessel s/p CABG in 06/2001 post-IMI, LVEF 35% up to 50% postoperatively;  . Essential hypertension, benign   . Hyperlipidemia   . Sleep apnea   . History of bipolar disorder   . Allergic rhinitis   . Asthma   . Erectile dysfunction   . Atrial fibrillation ([place])     Remote history - WARCEF  . Myocardial infarction ([place])   . Bipolar 1 disorder ([place])   . Depression   . CHF (congestive heart failure) [facility])    Past Surgical History  Procedure Laterality Date  . Coronary artery bypass graft  08/2000    Dr. Servando Snare - LIMA to LAD, SVG to diagonal, SVG to PDA  . Tonsillectomy    . Circumcision  10/2003  . Colonoscopy N/A 07/11/2012    Procedure: COLONOSCOPY;  Surgeon: Rogene Houston, MD;  Location: [facility];  Service: Endoscopy;  Laterality: N/A;  830-moved to 69 Ann to notify pt   Family History  Problem Relation Age of Onset  . Asthma Mother   . Heart attack Father 54  . Diabetes Brother    Social History  Substance Use Topics  . Smoking status: Former Smoker -- 1.00 packs/day for 15 years    Types: Cigarettes    Quit date: 01/11/1971  . Smokeless tobacco: None  . Alcohol Use: No    Review of Systems  Constitutional: Negative for fever and fatigue.  Respiratory: Negative for cough and shortness of breath.   Gastrointestinal: Negative for  abdominal pain.  Genitourinary: Negative for dysuria and hematuria.  Musculoskeletal: Negative for myalgias, back pain and neck stiffness.  All other systems reviewed and are negative.     Allergies  Review of patient's allergies indicates no known allergies.  Home Medications   Prior to Admission medications   Medication Sig Start Date End Date Taking? Authorizing Provider  ARIPiprazole (ABILIFY) 10 MG tablet Take 10 mg by mouth daily. 12/24/14  Yes Historical Provider, MD  aspirin EC 81 MG tablet Take 81 mg by mouth daily.   Yes Historical Provider, MD  atorvastatin (LIPITOR) 80 MG tablet Take 80 mg by mouth at bedtime.     Yes Historical Provider, MD  carbamazepine (TEGRETOL) 200 MG tablet Take 400 mg by mouth 2 (two) times daily.    Yes Historical Provider, MD  divalproex (DEPAKOTE) 250 MG DR tablet Take 500 mg by mouth every morning.    Yes Historical Provider, MD  furosemide (LASIX) 20 MG tablet Take 20 mg by mouth daily.   Yes Historical Provider, MD  lisinopril (PRINIVIL,ZESTRIL) 5 MG tablet TAKE ONE TABLET BY MOUTH ONCE DAILY. 09/05/14  Yes [name], MD  risperiDONE (RISPERDAL) 1 MG tablet Take 1-2 mg by mouth 2 (two) times daily. Takes 1 tablet in the morning Take 2 tablets at night   Yes Historical Provider, MD  temazepam (  RESTORIL) 15 MG capsule Take 1 capsule (15 mg total) by mouth at bedtime as needed for sleep. 12/05/14  Yes Harvel Quale, MD  ciprofloxacin (CIPRO) 500 MG tablet Take 1 tablet (500 mg total) by mouth 2 (two) times daily. Patient not taking: Reported on 12/25/2014 10/21/14   [name], MD  gatifloxacin (ZYMAXID) 0.5 % SOLN 1 drop as directed. In preparation for Cataract surgery. To start course on 12/25/2014 12/11/14   Historical Provider, MD  ketorolac (ACULAR) 0.5 % ophthalmic solution 1 drop See admin instructions. In preparation for Cataract surgery. To start course on 12/25/2014 12/11/14   Historical Provider, MD  prednisoLONE acetate (PRED  FORTE) 1 % ophthalmic suspension In preparation for Cataract surgery. To start course on 12/25/2014 12/11/14   Historical Provider, MD   BP 144/83 mmHg  Pulse 81  Temp(Src) 98.1 F (36.7 C) (Oral)  Resp 18  Ht 5\' 11"  (1.803 m)  Wt 233 lb (105.688 kg)  BMI 32.51 kg/m2  SpO2 100% Physical Exam  Constitutional: He is oriented to person, place, and time. He appears well-developed and well-nourished.  HENT:  Head: Normocephalic and atraumatic.  Neck: Normal range of motion.  Cardiovascular: Normal rate.   Pulmonary/Chest: Effort normal and breath sounds normal. No respiratory distress.  Abdominal: Soft. He exhibits no distension. There is no tenderness.  Musculoskeletal: Normal range of motion. He exhibits no edema or tenderness.  Neurological: He is alert and oriented to person, place, and time.  Skin: Skin is warm and dry. No rash noted. No erythema.  Nursing note and vitals reviewed.   ED Course  Procedures (including critical care time) Labs Review Labs Reviewed - No data to display  Imaging Review No results found. I have personally reviewed and evaluated these images and lab results as part of my medical decision-making.   EKG Interpretation None      MDM   Final diagnoses:  Foley catheter problem, initial encounter [facility])    Prior to my evaluation, patient had foley flushed without a problem. Also found that balloon was only filled to approximately 3 cc, filled up the rest of the way. No problems after 2 hours of observation. Stable for dc with pcp and urology follow up.     Merrily Pew, MD 12/26/14 [phone]

## 2014-12-25 NOTE — ED Notes (Signed)
Pt states foley began leaking around meatus 30 min PTA.

## 2014-12-25 NOTE — ED Notes (Signed)
PT foley intact and flushed with 60cc and tolerated well. PT catheter secured with leg bag provided by ED last pm.

## 2014-12-25 NOTE — ED Notes (Signed)
PT c/o catheter leaking this evening prior to ED arrival.

## 2014-12-27 ENCOUNTER — Emergency Department (HOSPITAL_COMMUNITY)
Admission: EM | Admit: 2014-12-27 | Discharge: 2014-12-27 | Disposition: A | Discharge status: Home / Self Care | Payer: Medicare Other | Attending: Emergency Medicine | Admitting: Emergency Medicine

## 2014-12-27 ENCOUNTER — Encounter (HOSPITAL_COMMUNITY): Payer: Self-pay | Admitting: Emergency Medicine

## 2014-12-27 ENCOUNTER — Telehealth: Payer: Self-pay | Admitting: *Deleted

## 2014-12-27 DIAGNOSIS — T83038A Leakage of other indwelling urethral catheter, initial encounter: Secondary | ICD-10-CM | POA: Insufficient documentation

## 2014-12-27 DIAGNOSIS — I509 Heart failure, unspecified: Secondary | ICD-10-CM | POA: Insufficient documentation

## 2014-12-27 DIAGNOSIS — I110 Hypertensive heart disease with heart failure: Secondary | ICD-10-CM | POA: Insufficient documentation

## 2014-12-27 DIAGNOSIS — N390 Urinary tract infection, site not specified: Secondary | ICD-10-CM | POA: Insufficient documentation

## 2014-12-27 DIAGNOSIS — Z87891 Personal history of nicotine dependence: Secondary | ICD-10-CM | POA: Insufficient documentation

## 2014-12-27 DIAGNOSIS — H268 Other specified cataract: Secondary | ICD-10-CM | POA: Insufficient documentation

## 2014-12-27 DIAGNOSIS — F329 Major depressive disorder, single episode, unspecified: Secondary | ICD-10-CM | POA: Insufficient documentation

## 2014-12-27 DIAGNOSIS — Z7982 Long term (current) use of aspirin: Secondary | ICD-10-CM | POA: Insufficient documentation

## 2014-12-27 DIAGNOSIS — T839XXA Unspecified complication of genitourinary prosthetic device, implant and graft, initial encounter: Secondary | ICD-10-CM

## 2014-12-27 DIAGNOSIS — Z79899 Other long term (current) drug therapy: Secondary | ICD-10-CM | POA: Insufficient documentation

## 2014-12-27 DIAGNOSIS — I4891 Unspecified atrial fibrillation: Secondary | ICD-10-CM | POA: Insufficient documentation

## 2014-12-27 DIAGNOSIS — T83098A Other mechanical complication of other indwelling urethral catheter, initial encounter: Secondary | ICD-10-CM | POA: Diagnosis not present

## 2014-12-27 DIAGNOSIS — F319 Bipolar disorder, unspecified: Secondary | ICD-10-CM | POA: Insufficient documentation

## 2014-12-27 DIAGNOSIS — Z951 Presence of aortocoronary bypass graft: Secondary | ICD-10-CM | POA: Insufficient documentation

## 2014-12-27 DIAGNOSIS — G4730 Sleep apnea, unspecified: Secondary | ICD-10-CM | POA: Insufficient documentation

## 2014-12-27 LAB — URINE MICROSCOPIC-ADD ON

## 2014-12-27 LAB — URINALYSIS, ROUTINE W REFLEX MICROSCOPIC
BILIRUBIN URINE: NEGATIVE
Glucose, UA: NEGATIVE mg/dL
Ketones, ur: NEGATIVE mg/dL
NITRITE: NEGATIVE
Protein, ur: 30 mg/dL — AB
SPECIFIC GRAVITY, URINE: 1.019 (ref 1.005–1.030)
pH: 7.5 (ref 5.0–8.0)

## 2014-12-27 MED ORDER — CEPHALEXIN 500 MG PO CAPS: 500.0000 mg | ORAL_CAPSULE | Freq: Four times a day (QID) | ORAL | Status: DC

## 2014-12-27 MED ORDER — CEPHALEXIN 250 MG PO CAPS
500.0000 mg | ORAL_CAPSULE | Freq: Once | ORAL | Status: AC
  Administered 2014-12-27: 500 mg via ORAL
  Filled 2014-12-27: qty 2

## 2014-12-27 NOTE — Telephone Encounter (Signed)
Grant Stevens was very upset that CM could not write another Rx for Keflex as he states he has visited the ED x 4 times since 10/16 and has finally gotten resolution to his issue. Only to misplace his Rx. CM explained the ED policy of not reissuing prescriptions as they are given as hard copies and cannot be reproduced. I suggested he call his PCP, have them call me to verify Rx and they could rewrite for him. Grant Stevens is agreeable and will follow these steps.

## 2014-12-27 NOTE — Discharge Instructions (Signed)
Urinary Tract Infection °Urinary tract infections (UTIs) can develop anywhere along your urinary tract. Your urinary tract is your body's drainage system for removing wastes and extra water. Your urinary tract includes two kidneys, two ureters, a bladder, and a urethra. Your kidneys are a pair of bean-shaped organs. Each kidney is about the size of your fist. They are located below your ribs, one on each side of your spine. °CAUSES °Infections are caused by microbes, which are microscopic organisms, including fungi, viruses, and bacteria. These organisms are so small that they can only be seen through a microscope. Bacteria are the microbes that most commonly cause UTIs. °SYMPTOMS  °Symptoms of UTIs may vary by age and gender of the patient and by the location of the infection. Symptoms in young women typically include a frequent and intense urge to urinate and a painful, burning feeling in the bladder or urethra during urination. Older women and men are more likely to be tired, shaky, and weak and have muscle aches and abdominal pain. A fever may mean the infection is in your kidneys. Other symptoms of a kidney infection include pain in your back or sides below the ribs, nausea, and vomiting. °DIAGNOSIS °To diagnose a UTI, your caregiver will ask you about your symptoms. Your caregiver will also ask you to provide a urine sample. The urine sample will be tested for bacteria and white blood cells. White blood cells are made by your body to help fight infection. °TREATMENT  °Typically, UTIs can be treated with medication. Because most UTIs are caused by a bacterial infection, they usually can be treated with the use of antibiotics. The choice of antibiotic and length of treatment depend on your symptoms and the type of bacteria causing your infection. °HOME CARE INSTRUCTIONS °· If you were prescribed antibiotics, take them exactly as your caregiver instructs you. Finish the medication even if you feel better after  you have only taken some of the medication. °· Drink enough water and fluids to keep your urine clear or pale yellow. °· Avoid caffeine, tea, and carbonated beverages. They tend to irritate your bladder. °· Empty your bladder often. Avoid holding urine for long periods of time. °· Empty your bladder before and after sexual intercourse. °· After a bowel movement, women should cleanse from front to back. Use each tissue only once. °SEEK MEDICAL CARE IF:  °1. You have back pain. °2. You develop a fever. °3. Your symptoms do not begin to resolve within 3 days. °SEEK IMMEDIATE MEDICAL CARE IF:  °1. You have severe back pain or lower abdominal pain. °2. You develop chills. °3. You have nausea or vomiting. °4. You have continued burning or discomfort with urination. °MAKE SURE YOU:  °1. Understand these instructions. °2. Will watch your condition. °3. Will get help right away if you are not doing well or get worse. °  °This information is not intended to replace advice given to you by your health care provider. Make sure you discuss any questions you have with your health care provider. °  °Document Released: 10/06/2004 Document Revised: 09/17/2014 Document Reviewed: 02/04/2011 °Elsevier Interactive Patient Education ©2016 Elsevier Inc[employer] ° ° °Foley Catheter Care, Adult °A Foley catheter is a soft, flexible tube that is placed into the bladder to drain urine. A Foley catheter may be inserted if: °· You leak urine or are not able to control when you urinate (urinary incontinence). °· You are not able to urinate when you need to (urinary retention). °· You had   prostate surgery or surgery on the genitals. °· You have certain medical conditions, such as multiple sclerosis, dementia, or a spinal cord injury. °If you are going home with a Foley catheter in place, follow the instructions below. °TAKING CARE OF THE CATHETER °4. Wash your hands with soap and water. °5. Using mild soap and warm water on a clean washcloth: °¨ Clean the  area on your body closest to the catheter insertion site using a circular motion, moving away from the catheter. Never wipe toward the catheter because this could sweep bacteria up into the urethra and cause infection. °¨ Remove all traces of soap. Pat the area dry with a clean towel. For males, reposition the foreskin. °6. Attach the catheter to your leg so there is no tension on the catheter. Use adhesive tape or a leg strap. If you are using adhesive tape, remove any sticky residue left behind by the previous tape you used. °7. Keep the drainage bag below the level of the bladder, but keep it off the floor. °8. Check throughout the day to be sure the catheter is working and urine is draining freely. Make sure the tubing does not become kinked. °9. Do not pull on the catheter or try to remove it. Pulling could damage internal tissues. °TAKING CARE OF THE DRAINAGE BAGS °You will be given two drainage bags to take home. One is a large overnight drainage bag, and the other is a smaller leg bag that fits underneath clothing. You may wear the overnight bag at any time, but you should never wear the smaller leg bag at night. Follow the instructions below for how to empty, change, and clean your drainage bags. °Emptying the Drainage Bag °You must empty your drainage bag when it is  -½ full or at least 2-3 times a day. °5. Wash your hands with soap and water. °6. Keep the drainage bag below your hips, below the level of your bladder. This stops urine from going back into the tubing and into your bladder. °7. Hold the dirty bag over the toilet or a clean container. °8. Open the pour spout at the bottom of the bag and empty the urine into the toilet or container. Do not let the pour spout touch the toilet, container, or any other surface. Doing so can place bacteria on the bag, which can cause an infection. °9. Clean the pour spout with a gauze pad or cotton ball that has rubbing alcohol on it. °10. Close the pour  spout. °11. Attach the bag to your leg with adhesive tape or a leg strap. °12. Wash your hands well. °Changing the Drainage Bag °Change your drainage bag once a month or sooner if it starts to smell bad or look dirty. Below are steps to follow when changing the drainage bag. °4. Wash your hands with soap and water. °5. Pinch off the rubber catheter so that urine does not spill out. °6. Disconnect the catheter tube from the drainage tube at the connection valve. Do not let the tubes touch any surface. °7. Clean the end of the catheter tube with an alcohol wipe. Use a different alcohol wipe to clean the end of the drainage tube. °8. Connect the catheter tube to the drainage tube of the clean drainage bag. °9. Attach the new bag to the leg with adhesive tape or a leg strap. Avoid attaching the new bag too tightly. °10. Wash your hands well. °Cleaning the Drainage Bag °1. Wash your hands with soap and   water. °2. Wash the bag in warm, soapy water. °3. Rinse the bag thoroughly with warm water. °4. Fill the bag with a solution of white vinegar and water (1 cup vinegar to 1 qt warm water [.2 L vinegar to 1 L warm water]). Close the bag and soak it for 30 minutes in the solution. °5. Rinse the bag with warm water. °6. Hang the bag to dry with the pour spout open and hanging downward. °7. Store the clean bag (once it is dry) in a clean plastic bag. °8. Wash your hands well. °PREVENTING INFECTION °· Wash your hands before and after handling your catheter. °· Take showers daily and wash the area where the catheter enters your body. Do not take baths. Replace wet leg straps with dry ones, if this applies. °· Do not use powders, sprays, or lotions on the genital area. Only use creams, lotions, or ointments as directed by your caregiver. °· For females, wipe from front to back after each bowel movement. °· Drink enough fluids to keep your urine clear or pale yellow unless you have a fluid restriction. °· Do not let the drainage  bag or tubing touch or lie on the floor. °· Wear cotton underwear to absorb moisture and to keep your skin drier. °SEEK MEDICAL CARE IF:  °· Your urine is cloudy or smells unusually bad. °· Your catheter becomes clogged. °· You are not draining urine into the bag or your bladder feels full. °· Your catheter starts to leak. °SEEK IMMEDIATE MEDICAL CARE IF:  °· You have pain, swelling, redness, or pus where the catheter enters the body. °· You have pain in the abdomen, legs, lower back, or bladder. °· You have a fever. °· You see blood fill the catheter, or your urine is pink or red. °· You have nausea, vomiting, or chills. °· Your catheter gets pulled out. °MAKE SURE YOU:  °· Understand these instructions. °· Will watch your condition. °· Will get help right away if you are not doing well or get worse. °  °This information is not intended to replace advice given to you by your health care provider. Make sure you discuss any questions you have with your health care provider. °  °Document Released: 12/27/2004 Document Revised: 05/13/2013 Document Reviewed: 12/19/2011 °Elsevier Interactive Patient Education ©2016 Elsevier Inc. ° °

## 2014-12-27 NOTE — ED Notes (Signed)
Declined W/C at D/C and was escorted to lobby by RN. 

## 2014-12-27 NOTE — ED Notes (Signed)
Pt c/o leaking around foley since Thursday.  States he was seen at [facility] for same on Thursday and is still having leakage around insertion site.  Denies pain.

## 2014-12-27 NOTE — ED Provider Notes (Signed)
TIME SEEN: 6:20 AM  CHIEF COMPLAINT: Leaking around Foley catheter  HPI: Pt is a 74 y.o. male with history of hypertension, hyperlipidemia, atrial fibrillation who presents to the emergency department with complaints of leaking around his Foley catheter. Patient had a Foley catheter placed on October 27 for urinary retention. Has since seen his urologist and states that he was unable to have his Foley catheter removed. States that he thinks that it was changed to proximally one month ago. He has been in the emergency department several times for leaking around his Foley catheter. No fevers, chills, nausea, vomiting or diarrhea. No testicular pain or swelling. No other discharge from his penis.  ROS: See HPI Constitutional: no fever  Eyes: no drainage  ENT: no runny nose   Cardiovascular:  no chest pain  Resp: no SOB  GI: no vomiting GU: no dysuria Integumentary: no rash  Allergy: no hives  Musculoskeletal: no leg swelling  Neurological: no slurred speech ROS otherwise negative  PAST MEDICAL HISTORY/PAST SURGICAL HISTORY:  Past Medical History  Diagnosis Date  . Coronary atherosclerosis of native coronary artery     Multivessel s/p CABG in 06/2001 post-IMI, LVEF 35% up to 50% postoperatively;  . Essential hypertension, benign   . Hyperlipidemia   . Sleep apnea   . History of bipolar disorder   . Allergic rhinitis   . Asthma   . Erectile dysfunction   . Atrial fibrillation ([place])     Remote history - WARCEF  . Myocardial infarction ([place])   . Bipolar 1 disorder ([place])   . Depression   . CHF (congestive heart failure) ([facility])     MEDICATIONS:  Prior to Admission medications   Medication Sig Start Date End Date Taking? Authorizing Provider  ARIPiprazole (ABILIFY) 10 MG tablet Take 10 mg by mouth daily. 12/24/14   Historical Provider, MD  aspirin EC 81 MG tablet Take 81 mg by mouth daily.    Historical Provider, MD  atorvastatin (LIPITOR) 80 MG tablet Take 80 mg by mouth at bedtime.       Historical Provider, MD  carbamazepine (TEGRETOL) 200 MG tablet Take 400 mg by mouth 2 (two) times daily.     Historical Provider, MD  ciprofloxacin (CIPRO) 500 MG tablet Take 1 tablet (500 mg total) by mouth 2 (two) times daily. Patient not taking: Reported on 12/25/2014 10/21/14   [name], MD  divalproex (DEPAKOTE) 250 MG DR tablet Take 500 mg by mouth every morning.     Historical Provider, MD  furosemide (LASIX) 20 MG tablet Take 20 mg by mouth daily.    Historical Provider, MD  gatifloxacin (ZYMAXID) 0.5 % SOLN 1 drop as directed. In preparation for Cataract surgery. To start course on 12/25/2014 12/11/14   Historical Provider, MD  ketorolac (ACULAR) 0.5 % ophthalmic solution 1 drop See admin instructions. In preparation for Cataract surgery. To start course on 12/25/2014 12/11/14   Historical Provider, MD  lisinopril (PRINIVIL,ZESTRIL) 5 MG tablet TAKE ONE TABLET BY MOUTH ONCE DAILY. 09/05/14   [name], MD  prednisoLONE acetate (PRED FORTE) 1 % ophthalmic suspension In preparation for Cataract surgery. To start course on 12/25/2014 12/11/14   Historical Provider, MD  risperiDONE (RISPERDAL) 1 MG tablet Take 1-2 mg by mouth 2 (two) times daily. Takes 1 tablet in the morning Take 2 tablets at night    Historical Provider, MD  temazepam (RESTORIL) 15 MG capsule Take 1 capsule (15 mg total) by mouth at bedtime as needed for sleep.  12/05/14   Harvel Quale, MD    ALLERGIES:  No Known Allergies  SOCIAL HISTORY:  Social History  Substance Use Topics  . Smoking status: Former Smoker -- 1.00 packs/day for 15 years    Types: Cigarettes    Quit date: 01/11/1971  . Smokeless tobacco: Not on file  . Alcohol Use: No    FAMILY HISTORY: Family History  Problem Relation Age of Onset  . Asthma Mother   . Heart attack Father 41  . Diabetes Brother     EXAM: BP 133/70 mmHg  Pulse 68  Temp(Src) 97.5 F (36.4 C) (Oral)  Resp 16  SpO2 100% CONSTITUTIONAL: Alert and oriented  and responds appropriately to questions. Well-appearing; well-nourished HEAD: Normocephalic EYES: Conjunctivae clear, PERRL ENT: normal nose; no rhinorrhea; moist mucous membranes; pharynx without lesions noted NECK: Supple, no meningismus, no LAD  CARD: RRR; S1 and S2 appreciated; no murmurs, no clicks, no rubs, no gallops RESP: Normal chest excursion without splinting or tachypnea; breath sounds clear and equal bilaterally; no wheezes, no rhonchi, no rales, no hypoxia or respiratory distress, speaking full sentences ABD/GI: Normal bowel sounds; non-distended; soft, non-tender, no rebound, no guarding, no peritoneal signs GU:  GU:  Normal external genitalia, circumcised male, normal penile shaft, no blood or discharge at the urethral meatus, patient has a 16 French indwelling Foley catheter and has large amount of urine in his underwear but no active drainage around the catheter, no testicular masses or tenderness on exam, no scrotal masses or swelling, no hernias appreciated, 2+ femoral pulses bilaterally; no perineal erythema, warmth, subcutaneous air or crepitus; no high riding testicle, normal bilateral cremasteric reflex BACK:  The back appears normal and is non-tender to palpation, there is no CVA tenderness EXT: Normal ROM in all joints; non-tender to palpation; no edema; normal capillary refill; no cyanosis, no calf tenderness or swelling    SKIN: Normal color for age and race; warm NEURO: Moves all extremities equally, sensation to light touch intact diffusely, cranial nerves II through XII intact PSYCH: The patient's mood and manner are appropriate. Grooming and personal hygiene are appropriate.  MEDICAL DECISION MAKING: Pt here with leaking around his Foley catheter. He has had this problem before and was found to have a hole in the to being previously which was replaced and then found to have a not fully dilated balloon. He states that he has not had his catheter changed in over one  month. We have replaced his catheter today with another 65 [place] and he reports feeling much better and is no longer leaking urine. Urinalysis shows possible infection versus contamination. Culture is pending. Will discharge on Keflex and have him follow-up with his urologist. Discussed return precautions. He verbalizes understanding and is comfortable with this plan.   Of note he had one blood pressure that was documented low but suspect this was erroneous. He has not had any lightheadedness or other symptoms. Repeat blood pressure without any other intervention has improved.       [place], DO 12/27/14 [phone]

## 2014-12-29 ENCOUNTER — Ambulatory Visit (HOSPITAL_COMMUNITY)
Admission: RE | Admit: 2014-12-29 | Discharge: 2014-12-29 | Disposition: A | Discharge status: Home / Self Care | Payer: Medicare Other | Source: Ambulatory Visit | Attending: Ophthalmology | Admitting: Ophthalmology

## 2014-12-29 ENCOUNTER — Ambulatory Visit (HOSPITAL_COMMUNITY): Payer: Medicare Other | Admitting: Anesthesiology

## 2014-12-29 ENCOUNTER — Encounter (HOSPITAL_COMMUNITY): Payer: Self-pay | Admitting: *Deleted

## 2014-12-29 ENCOUNTER — Encounter (HOSPITAL_COMMUNITY)
Admission: RE | Disposition: A | Discharge status: Home / Self Care | Payer: Self-pay | Source: Ambulatory Visit | Attending: Ophthalmology

## 2014-12-29 ENCOUNTER — Emergency Department (HOSPITAL_COMMUNITY)
Admit: 2014-12-29 | Discharge: 2014-12-29 | Disposition: A | Discharge status: Home / Self Care | Payer: Medicare Other | Attending: Emergency Medicine | Admitting: Emergency Medicine

## 2014-12-29 ENCOUNTER — Encounter (HOSPITAL_COMMUNITY): Payer: Self-pay | Admitting: Emergency Medicine

## 2014-12-29 DIAGNOSIS — T839XXA Unspecified complication of genitourinary prosthetic device, implant and graft, initial encounter: Secondary | ICD-10-CM

## 2014-12-29 DIAGNOSIS — Z79899 Other long term (current) drug therapy: Secondary | ICD-10-CM | POA: Diagnosis not present

## 2014-12-29 DIAGNOSIS — H269 Unspecified cataract: Secondary | ICD-10-CM | POA: Diagnosis not present

## 2014-12-29 DIAGNOSIS — H538 Other visual disturbances: Secondary | ICD-10-CM | POA: Diagnosis not present

## 2014-12-29 DIAGNOSIS — I509 Heart failure, unspecified: Secondary | ICD-10-CM | POA: Diagnosis not present

## 2014-12-29 DIAGNOSIS — G4730 Sleep apnea, unspecified: Secondary | ICD-10-CM | POA: Diagnosis not present

## 2014-12-29 DIAGNOSIS — Z7982 Long term (current) use of aspirin: Secondary | ICD-10-CM | POA: Diagnosis not present

## 2014-12-29 DIAGNOSIS — Z87891 Personal history of nicotine dependence: Secondary | ICD-10-CM | POA: Diagnosis not present

## 2014-12-29 DIAGNOSIS — I4891 Unspecified atrial fibrillation: Secondary | ICD-10-CM | POA: Diagnosis not present

## 2014-12-29 DIAGNOSIS — T83098A Other mechanical complication of other indwelling urethral catheter, initial encounter: Secondary | ICD-10-CM | POA: Diagnosis not present

## 2014-12-29 DIAGNOSIS — F329 Major depressive disorder, single episode, unspecified: Secondary | ICD-10-CM | POA: Diagnosis not present

## 2014-12-29 DIAGNOSIS — I110 Hypertensive heart disease with heart failure: Secondary | ICD-10-CM | POA: Diagnosis not present

## 2014-12-29 DIAGNOSIS — H268 Other specified cataract: Secondary | ICD-10-CM | POA: Diagnosis not present

## 2014-12-29 DIAGNOSIS — F319 Bipolar disorder, unspecified: Secondary | ICD-10-CM | POA: Diagnosis not present

## 2014-12-29 DIAGNOSIS — Z951 Presence of aortocoronary bypass graft: Secondary | ICD-10-CM | POA: Diagnosis not present

## 2014-12-29 DIAGNOSIS — T83038A Leakage of other indwelling urethral catheter, initial encounter: Secondary | ICD-10-CM | POA: Diagnosis not present

## 2014-12-29 DIAGNOSIS — H2512 Age-related nuclear cataract, left eye: Secondary | ICD-10-CM | POA: Diagnosis not present

## 2014-12-29 DIAGNOSIS — N390 Urinary tract infection, site not specified: Secondary | ICD-10-CM | POA: Diagnosis not present

## 2014-12-29 HISTORY — PX: CATARACT EXTRACTION W/PHACO: SHX586

## 2014-12-29 LAB — URINALYSIS, ROUTINE W REFLEX MICROSCOPIC
Bilirubin Urine: NEGATIVE
Glucose, UA: NEGATIVE mg/dL
KETONES UR: NEGATIVE mg/dL
NITRITE: NEGATIVE
PROTEIN: 30 mg/dL — AB
Specific Gravity, Urine: 1.025 (ref 1.005–1.030)
pH: 6 (ref 5.0–8.0)

## 2014-12-29 LAB — URINE CULTURE: Culture: >=100000

## 2014-12-29 LAB — URINE MICROSCOPIC-ADD ON: SQUAMOUS EPITHELIAL / LPF: NONE SEEN

## 2014-12-29 SURGERY — PHACOEMULSIFICATION, CATARACT, WITH IOL INSERTION
Anesthesia: Monitor Anesthesia Care | Laterality: Left

## 2014-12-29 MED ORDER — LACTATED RINGERS IV SOLN
INTRAVENOUS | Status: DC
  Administered 2014-12-29: 09:00:00 via INTRAVENOUS

## 2014-12-29 MED ORDER — TETRACAINE 0.5 % OP SOLN OPTIME - NO CHARGE
OPHTHALMIC | Status: DC | PRN
  Administered 2014-12-29: 1 [drp] via OPHTHALMIC

## 2014-12-29 MED ORDER — LIDOCAINE HCL 3.5 % OP GEL
OPHTHALMIC | Status: DC | PRN
  Administered 2014-12-29: 1 via OPHTHALMIC

## 2014-12-29 MED ORDER — FENTANYL CITRATE (PF) 100 MCG/2ML IJ SOLN
25.0000 ug | INTRAMUSCULAR | Status: AC
  Administered 2014-12-29: 25 ug via INTRAVENOUS

## 2014-12-29 MED ORDER — POVIDONE-IODINE 5 % OP SOLN
OPHTHALMIC | Status: DC | PRN
  Administered 2014-12-29: 1 via OPHTHALMIC

## 2014-12-29 MED ORDER — BSS IO SOLN
INTRAOCULAR | Status: DC | PRN
  Administered 2014-12-29: 15 mL via INTRAOCULAR

## 2014-12-29 MED ORDER — MIDAZOLAM HCL 2 MG/2ML IJ SOLN
INTRAMUSCULAR | Status: AC
  Filled 2014-12-29: qty 2

## 2014-12-29 MED ORDER — LIDOCAINE HCL 3.5 % OP GEL: 1.0000 "application " | Freq: Once | OPHTHALMIC | Status: DC

## 2014-12-29 MED ORDER — PHENYLEPHRINE-KETOROLAC 1-0.3 % IO SOLN
INTRAOCULAR | Status: AC
  Filled 2014-12-29: qty 4

## 2014-12-29 MED ORDER — MIDAZOLAM HCL 2 MG/2ML IJ SOLN
1.0000 mg | INTRAMUSCULAR | Status: DC | PRN
Start: 2014-12-29 — End: 2014-12-29
  Administered 2014-12-29: 2 mg via INTRAVENOUS

## 2014-12-29 MED ORDER — CYCLOPENTOLATE-PHENYLEPHRINE OP SOLN OPTIME - NO CHARGE
OPHTHALMIC | Status: AC
  Filled 2014-12-29: qty 2

## 2014-12-29 MED ORDER — PHENYLEPHRINE-KETOROLAC 1-0.3 % IO SOLN
INTRAOCULAR | Status: DC | PRN
  Administered 2014-12-29: 500 mL via OPHTHALMIC

## 2014-12-29 MED ORDER — TETRACAINE HCL 0.5 % OP SOLN
1.0000 [drp] | OPHTHALMIC | Status: AC
  Administered 2014-12-29 (×3): 1 [drp] via OPHTHALMIC

## 2014-12-29 MED ORDER — TETRACAINE HCL 0.5 % OP SOLN
OPHTHALMIC | Status: AC
  Filled 2014-12-29: qty 4

## 2014-12-29 MED ORDER — FENTANYL CITRATE (PF) 100 MCG/2ML IJ SOLN
INTRAMUSCULAR | Status: AC
  Filled 2014-12-29: qty 2

## 2014-12-29 MED ORDER — NA HYALUR & NA CHOND-NA HYALUR 0.55-0.5 ML IO KIT
PACK | INTRAOCULAR | Status: DC | PRN
  Administered 2014-12-29: 1 via OPHTHALMIC

## 2014-12-29 MED ORDER — CYCLOPENTOLATE-PHENYLEPHRINE 0.2-1 % OP SOLN
1.0000 [drp] | OPHTHALMIC | Status: AC
  Administered 2014-12-29 (×3): 1 [drp] via OPHTHALMIC

## 2014-12-29 MED ORDER — LIDOCAINE HCL 3.5 % OP GEL
OPHTHALMIC | Status: AC
  Filled 2014-12-29: qty 1

## 2014-12-29 SURGICAL SUPPLY — 28 items
CAPSULAR TENSION RING-AMO (OPHTHALMIC RELATED) IMPLANT
CLOTH BEACON ORANGE TIMEOUT ST (SAFETY) ×3 IMPLANT
GLOVE BIO SURGEON STRL SZ7.5 (GLOVE) IMPLANT
GLOVE BIOGEL M 6.5 STRL (GLOVE) IMPLANT
GLOVE BIOGEL PI IND STRL 6.5 (GLOVE) ×1 IMPLANT
GLOVE BIOGEL PI IND STRL 7.0 (GLOVE) ×1 IMPLANT
GLOVE BIOGEL PI INDICATOR 6.5 (GLOVE) ×2
GLOVE BIOGEL PI INDICATOR 7.0 (GLOVE) ×2
GLOVE ECLIPSE 6.5 STRL STRAW (GLOVE) IMPLANT
GLOVE ECLIPSE 7.5 STRL STRAW (GLOVE) IMPLANT
GLOVE EXAM NITRILE LRG STRL (GLOVE) IMPLANT
GLOVE EXAM NITRILE MD LF STRL (GLOVE) IMPLANT
GLOVE SKINSENSE NS SZ6.5 (GLOVE)
GLOVE SKINSENSE NS SZ7.0 (GLOVE)
GLOVE SKINSENSE STRL SZ6.5 (GLOVE) IMPLANT
GLOVE SKINSENSE STRL SZ7.0 (GLOVE) IMPLANT
INST SET CATARACT ~~LOC~~ (KITS) ×3 IMPLANT
KIT VITRECTOMY (OPHTHALMIC RELATED) IMPLANT
LENS ALC ACRYL/TECN (Ophthalmic Related) ×3 IMPLANT
PAD ARMBOARD 7.5X6 YLW CONV (MISCELLANEOUS) ×3 IMPLANT
PROC W NO LENS (INTRAOCULAR LENS)
PROC W SPEC LENS (INTRAOCULAR LENS)
PROCESS W NO LENS (INTRAOCULAR LENS) IMPLANT
PROCESS W SPEC LENS (INTRAOCULAR LENS) IMPLANT
RETRACTOR IRIS SIGHTPATH (OPHTHALMIC RELATED) IMPLANT
RING MALYGIN (MISCELLANEOUS) IMPLANT
VISCOELASTIC ADDITIONAL (OPHTHALMIC RELATED) IMPLANT
WATER STERILE IRR 250ML POUR (IV SOLUTION) ×3 IMPLANT

## 2014-12-29 NOTE — Brief Op Note (Signed)
12/29/2014  10:05 AM  PATIENT:  [name]  74 y.o. male  PRE-OPERATIVE DIAGNOSIS:  nuclear cataract left eye  POST-OPERATIVE DIAGNOSIS:  nuclear cataract left eye  PROCEDURE:  Procedure(s): CATARACT EXTRACTION PHACO AND INTRAOCULAR LENS PLACEMENT (IOC)  SURGEON:  Surgeon(s): Williams Che, MD  ASSISTANTS:    Zoila Shutter, CST   ANESTHESIA STAFF: Anesthesiologist: Lerry Liner, MD CRNA: Vista Deck, CRNA  ANESTHESIA:   topical and MAC  REQUESTED LENS POWER: 16.5  LENS IMPLANT INFORMATION:   [employer]con SN60 WF    S/n [id number]   Exp 12/2017  CUMULATIVE DISSIPATED ENERGY:3.71  INDICATIONS:see scanned office H&P for particulars  OP FINDINGS:mod dense NS  COMPLICATIONS:None  DICTATION #:   none  PLAN OF CARE: as above  PATIENT DISPOSITION:  Short Stay

## 2014-12-29 NOTE — Op Note (Signed)
12/29/2014  10:05 AM  PATIENT:  Grant Stevens  74 y.o. male  PRE-OPERATIVE DIAGNOSIS:  nuclear cataract left eye  POST-OPERATIVE DIAGNOSIS:  nuclear cataract left eye  PROCEDURE:  Procedure(s): CATARACT EXTRACTION PHACO AND INTRAOCULAR LENS PLACEMENT (IOC)  SURGEON:  Surgeon(s): Williams Che, MD  ASSISTANTS:    Zoila Shutter, CST   ANESTHESIA STAFF: Anesthesiologist: Lerry Liner, MD CRNA: Vista Deck, CRNA  ANESTHESIA:   topical and MAC  REQUESTED LENS POWER: 16.5  LENS IMPLANT INFORMATION:   Alcon SN60 WF    S/n [id number]   Exp 12/2017  CUMULATIVE DISSIPATED ENERGY:3.71  INDICATIONS:see scanned office H&P for particulars  OP FINDINGS:mod dense NS  COMPLICATIONS:None  PROCEDURE:  The patient was brought to the operating room in good condition.  The operative eye was prepped and draped in the usual fashion for intraocular surgery.  Lidocaine gel was dropped onto the eye.  A 2.4 mm 10 O'clock near clear corneal stepped incision and a 12 O'clock stab incision were created.  Viscoat was instilled into the anterior chamber.  The 5 mm anterior capsulorhexis was performed with a bent needle cystotome and Utrata forceps.  The lens was hydrodissected and hydrodelineated with a cannula and balanced salt solution and rotated with a Kuglen hook.  Phacoemulsification was perfomed in the divide and conquer technique.  The remaining cortex was removed with I&A and the capsular surfaces polished as necessary.  Provisc was placed into the capsular bag and the lens inserted with the Alcon inserter.  The viscoelastic was removed with I&A and the lens "rocked" into position.  The wounds were hydrated and te anterior chamber was refilled with balanced salt solution.  The wounds were checked for leakage and rehydrated as necessary.  The lid speculum and drapes were removed and the patient was transported to short stay in good condition. PATIENT DISPOSITION:  Short Stay

## 2014-12-29 NOTE — Anesthesia Postprocedure Evaluation (Signed)
  Anesthesia Post-op Note  Patient: Grant Stevens  Procedure(s) Performed: Procedure(s) (LRB): CATARACT EXTRACTION PHACO AND INTRAOCULAR LENS PLACEMENT (IOC) (Left)  Patient Location:  Short Stay  Anesthesia Type: MAC  Level of Consciousness: awake  Airway and Oxygen Therapy: Patient Spontanous Breathing  Post-op Pain: none  Post-op Assessment: Post-op Vital signs reviewed, Patient's Cardiovascular Status Stable, Respiratory Function Stable, Patent Airway, No signs of Nausea or vomiting and Pain level controlled  Post-op Vital Signs: Reviewed and stable  Complications: No apparent anesthesia complications

## 2014-12-29 NOTE — Discharge Instructions (Signed)
Foley Catheter Care, Adult °A Foley catheter is a soft, flexible tube that is placed into the bladder to drain urine. A Foley catheter may be inserted if: °· You leak urine or are not able to control when you urinate (urinary incontinence). °· You are not able to urinate when you need to (urinary retention). °· You had prostate surgery or surgery on the genitals. °· You have certain medical conditions, such as multiple sclerosis, dementia, or a spinal cord injury. °If you are going home with a Foley catheter in place, follow the instructions below. °TAKING CARE OF THE CATHETER °1. Wash your hands with soap and water. °2. Using mild soap and warm water on a clean washcloth: °¨ Clean the area on your body closest to the catheter insertion site using a circular motion, moving away from the catheter. Never wipe toward the catheter because this could sweep bacteria up into the urethra and cause infection. °¨ Remove all traces of soap. Pat the area dry with a clean towel. For males, reposition the foreskin. °3. Attach the catheter to your leg so there is no tension on the catheter. Use adhesive tape or a leg strap. If you are using adhesive tape, remove any sticky residue left behind by the previous tape you used. °4. Keep the drainage bag below the level of the bladder, but keep it off the floor. °5. Check throughout the day to be sure the catheter is working and urine is draining freely. Make sure the tubing does not become kinked. °6. Do not pull on the catheter or try to remove it. Pulling could damage internal tissues. °TAKING CARE OF THE DRAINAGE BAGS °You will be given two drainage bags to take home. One is a large overnight drainage bag, and the other is a smaller leg bag that fits underneath clothing. You may wear the overnight bag at any time, but you should never wear the smaller leg bag at night. Follow the instructions below for how to empty, change, and clean your drainage bags. °Emptying the Drainage  Bag °You must empty your drainage bag when it is  -½ full or at least 2-3 times a day. °1. Wash your hands with soap and water. °2. Keep the drainage bag below your hips, below the level of your bladder. This stops urine from going back into the tubing and into your bladder. °3. Hold the dirty bag over the toilet or a clean container. °4. Open the pour spout at the bottom of the bag and empty the urine into the toilet or container. Do not let the pour spout touch the toilet, container, or any other surface. Doing so can place bacteria on the bag, which can cause an infection. °5. Clean the pour spout with a gauze pad or cotton ball that has rubbing alcohol on it. °6. Close the pour spout. °7. Attach the bag to your leg with adhesive tape or a leg strap. °8. Wash your hands well. °Changing the Drainage Bag °Change your drainage bag once a month or sooner if it starts to smell bad or look dirty. Below are steps to follow when changing the drainage bag. °1. Wash your hands with soap and water. °2. Pinch off the rubber catheter so that urine does not spill out. °3. Disconnect the catheter tube from the drainage tube at the connection valve. Do not let the tubes touch any surface. °4. Clean the end of the catheter tube with an alcohol wipe. Use a different alcohol wipe to clean   the end of the drainage tube. °5. Connect the catheter tube to the drainage tube of the clean drainage bag. °6. Attach the new bag to the leg with adhesive tape or a leg strap. Avoid attaching the new bag too tightly. °7. Wash your hands well. °Cleaning the Drainage Bag °1. Wash your hands with soap and water. °2. Wash the bag in warm, soapy water. °3. Rinse the bag thoroughly with warm water. °4. Fill the bag with a solution of white vinegar and water (1 cup vinegar to 1 qt warm water [.2 L vinegar to 1 L warm water]). Close the bag and soak it for 30 minutes in the solution. °5. Rinse the bag with warm water. °6. Hang the bag to dry with the  pour spout open and hanging downward. °7. Store the clean bag (once it is dry) in a clean plastic bag. °8. Wash your hands well. °PREVENTING INFECTION °· Wash your hands before and after handling your catheter. °· Take showers daily and wash the area where the catheter enters your body. Do not take baths. Replace wet leg straps with dry ones, if this applies. °· Do not use powders, sprays, or lotions on the genital area. Only use creams, lotions, or ointments as directed by your caregiver. °· For females, wipe from front to back after each bowel movement. °· Drink enough fluids to keep your urine clear or pale yellow unless you have a fluid restriction. °· Do not let the drainage bag or tubing touch or lie on the floor. °· Wear cotton underwear to absorb moisture and to keep your skin drier. °SEEK MEDICAL CARE IF:  °· Your urine is cloudy or smells unusually bad. °· Your catheter becomes clogged. °· You are not draining urine into the bag or your bladder feels full. °· Your catheter starts to leak. °SEEK IMMEDIATE MEDICAL CARE IF:  °· You have pain, swelling, redness, or pus where the catheter enters the body. °· You have pain in the abdomen, legs, lower back, or bladder. °· You have a fever. °· You see blood fill the catheter, or your urine is pink or red. °· You have nausea, vomiting, or chills. °· Your catheter gets pulled out. °MAKE SURE YOU:  °· Understand these instructions. °· Will watch your condition. °· Will get help right away if you are not doing well or get worse. °  °This information is not intended to replace advice given to you by your health care provider. Make sure you discuss any questions you have with your health care provider. °  °Document Released: 12/27/2004 Document Revised: 05/13/2013 Document Reviewed: 12/19/2011 °Elsevier Interactive Patient Education ©2016 [employer] ° °

## 2014-12-29 NOTE — H&P (Signed)
I have reviewed the pre printed H&P, the patient was re-examined, and I have identified no significant interval changes in the patient's medical condition.  There is no change in the plan of care since the history and physical of record. 

## 2014-12-29 NOTE — Transfer of Care (Signed)
Immediate Anesthesia Transfer of Care Note  Patient: Grant Stevens  Procedure(s) Performed: Procedure(s) (LRB): CATARACT EXTRACTION PHACO AND INTRAOCULAR LENS PLACEMENT (IOC) (Left)  Patient Location: Shortstay  Anesthesia Type: MAC  Level of Consciousness: awake  Airway & Oxygen Therapy: Patient Spontanous Breathing   Post-op Assessment: Report given to PACU RN, Post -op Vital signs reviewed and stable and Patient moving all extremities  Post vital signs: Reviewed and stable  Complications: No apparent anesthesia complications

## 2014-12-29 NOTE — Anesthesia Procedure Notes (Signed)
Procedure Name: MAC Date/Time: 12/29/2014 9:29 AM Performed by: Vista Deck Pre-anesthesia Checklist: Patient identified, Emergency Drugs available, Suction available, Timeout performed and Patient being monitored Patient Re-evaluated:Patient Re-evaluated prior to inductionOxygen Delivery Method: Nasal Cannula

## 2014-12-29 NOTE — Discharge Instructions (Signed)

## 2014-12-29 NOTE — ED Provider Notes (Signed)
CSN: [id number]     Arrival date & time 12/29/14  2022 History  By signing my name below, I, Helane Gunther, attest that this documentation has been prepared under the direction and in the presence of Delora Fuel, MD. Electronically Signed: Helane Gunther, ED Scribe. 12/29/2014. 8:52 PM.    Chief Complaint  Patient presents with  . leaking catheter    The history is provided by the patient. No language interpreter was used.   HPI Comments: Grant Stevens is a 74 y.o. male former smoker with a PMHx of CHF, MI, A-fib, HLD, HTN, and asthma who presents to the Emergency Department complaining of a leaking Foley catheter onset this afternoon. Pt states he noticed leakage "around the crotch" and tried calling his urologist, who was not able to see him today. He states he was seen for the same in the ED at [facility] 2 days ago, where his catheter was "re-done". He notes he has lost the paperwork of that visit. He notes he will be seeing a urologist (Dr Noah Delaine) for this in 2 days. He also reports a PMHx of bipolar 1 disorder and depression.   Past Medical History  Diagnosis Date  . Coronary atherosclerosis of native coronary artery     Multivessel s/p CABG in 06/2001 post-IMI, LVEF 35% up to 50% postoperatively;  . Essential hypertension, benign   . Hyperlipidemia   . Sleep apnea   . History of bipolar disorder   . Allergic rhinitis   . Asthma   . Erectile dysfunction   . Atrial fibrillation ([place])     Remote history - WARCEF  . Myocardial infarction ([place])   . Bipolar 1 disorder ([place])   . Depression   . CHF (congestive heart failure) [facility])    Past Surgical History  Procedure Laterality Date  . Coronary artery bypass graft  08/2000    Dr. Servando Snare - LIMA to LAD, SVG to diagonal, SVG to PDA  . Tonsillectomy    . Circumcision  10/2003  . Colonoscopy N/A 07/11/2012    Procedure: COLONOSCOPY;  Surgeon: Rogene Houston, MD;  Location: [facility];  Service: Endoscopy;  Laterality: N/A;   830-moved to 6 Ann to notify pt   Family History  Problem Relation Age of Onset  . Asthma Mother   . Heart attack Father 60  . Diabetes Brother    Social History  Substance Use Topics  . Smoking status: Former Smoker -- 1.00 packs/day for 15 years    Types: Cigarettes    Quit date: 01/11/1971  . Smokeless tobacco: None  . Alcohol Use: No    Review of Systems  All other systems reviewed and are negative.   Allergies  Review of patient's allergies indicates no known allergies.  Home Medications   Prior to Admission medications   Medication Sig Start Date End Date Taking? Authorizing Provider  ARIPiprazole (ABILIFY) 10 MG tablet Take 10 mg by mouth daily. 12/24/14  Yes Historical Provider, MD  aspirin EC 81 MG tablet Take 81 mg by mouth daily.   Yes Historical Provider, MD  atorvastatin (LIPITOR) 80 MG tablet Take 80 mg by mouth at bedtime.     Yes Historical Provider, MD  carbamazepine (TEGRETOL) 200 MG tablet Take 400 mg by mouth 2 (two) times daily.    Yes Historical Provider, MD  cephALEXin (KEFLEX) 500 MG capsule Take 1 capsule (500 mg total) by mouth 4 (four) times daily. 12/27/14  Yes Buchtel, DO  divalproex (  DEPAKOTE) 250 MG DR tablet Take 500 mg by mouth every morning.    Yes Historical Provider, MD  furosemide (LASIX) 20 MG tablet Take 20 mg by mouth daily.   Yes Historical Provider, MD  lisinopril (PRINIVIL,ZESTRIL) 5 MG tablet TAKE ONE TABLET BY MOUTH ONCE DAILY. 09/05/14  Yes [name], MD  risperiDONE (RISPERDAL) 1 MG tablet Take 1-2 mg by mouth 2 (two) times daily. Takes 1 tablet in the morning Take 2 tablets at night   Yes Historical Provider, MD  temazepam (RESTORIL) 15 MG capsule Take 1 capsule (15 mg total) by mouth at bedtime as needed for sleep. 12/05/14  Yes Harvel Quale, MD  gatifloxacin (ZYMAXID) 0.5 % SOLN 1 drop as directed. In preparation for Cataract surgery. To start course on 12/25/2014 12/11/14   Historical Provider, MD  ketorolac  (ACULAR) 0.5 % ophthalmic solution 1 drop See admin instructions. In preparation for Cataract surgery. To start course on 12/25/2014 12/11/14   Historical Provider, MD  prednisoLONE acetate (PRED FORTE) 1 % ophthalmic suspension In preparation for Cataract surgery. To start course on 12/25/2014 12/11/14   Historical Provider, MD   BP 125/72 mmHg  Pulse 93  Temp(Src) 98.4 F (36.9 C) (Oral)  Resp 20  Ht 6' (1.829 m)  Wt 233 lb (105.688 kg)  BMI 31.59 kg/m2  SpO2 99% Physical Exam  Constitutional: He is oriented to person, place, and time. He appears well-developed and well-nourished.  HENT:  Head: Normocephalic and atraumatic.  Eyes: Conjunctivae and EOM are normal. Pupils are equal, round, and reactive to light. Right eye exhibits no discharge. Left eye exhibits no discharge.  Neck: Normal range of motion. Neck supple. No JVD present.  Cardiovascular: Normal rate, regular rhythm and normal heart sounds.   No murmur heard. Pulmonary/Chest: Effort normal and breath sounds normal. He has no wheezes. He has no rales. He exhibits no tenderness.  Abdominal: Soft. Bowel sounds are normal. He exhibits no distension and no mass. There is no tenderness.  Genitourinary:  uncircumcised penis, foley catheter in place with no obvious leaking, but underwear is wet, suggesting that there has been ongoing leaking.  Musculoskeletal: Normal range of motion. He exhibits edema.  2+ pretibial edema  Lymphadenopathy:    He has no cervical adenopathy.  Neurological: He is alert and oriented to person, place, and time. No cranial nerve deficit. He exhibits normal muscle tone. Coordination normal.  Skin: Skin is warm and dry. No rash noted. He is not diaphoretic. No erythema.  Psychiatric: He has a normal mood and affect. His behavior is normal. Judgment and thought content normal.  Nursing note and vitals reviewed.   ED Course  Procedures  DIAGNOSTIC STUDIES: Oxygen Saturation is 99% on RA, normal by my  interpretation.    COORDINATION OF CARE: 8:48 PM - Discussed plans to replace the catheter as well as to order diagnostic studies. Pt advised of plan for treatment and pt agrees.  Labs Review Labs Reviewed  URINALYSIS, ROUTINE W REFLEX MICROSCOPIC (NOT AT [facility]) - Abnormal; Notable for the following:    APPearance CLOUDY (*)    Hgb urine dipstick MODERATE (*)    Protein, ur 30 (*)    Leukocytes, UA MODERATE (*)    All other components within normal limits  URINE MICROSCOPIC-ADD ON - Abnormal; Notable for the following:    Bacteria, UA FEW (*)    All other components within normal limits  URINE CULTURE   I have personally reviewed and evaluated these  lab results as part of my medical decision-making.   MDM   Final diagnoses:  Foley catheter problem, initial encounter ([place])    Foley catheter dysfunction. He is urinating around the catheter which could indicate obstructed catheter possible bladder spasm. Old records are reviewed and he has multiple ED visits for problems with his Foley catheter. He states he has an appointment with his urologist in 2 days. For catheter is removed and urinalysis obtained which does show significant pyuria, but only a few bacteria. Specimen is sent for culture. With chronic indwelling catheter, no indication to treat and told he has a clinical infection. He is discharged with instructions to follow-up with his urologist in 2 days as scheduled.  I personally performed the services described in this documentation, which was scribed in my presence. The recorded information has been reviewed and is accurate.      Delora Fuel, MD [id number]

## 2014-12-29 NOTE — ED Notes (Signed)
Pt states his catheter started leaking again today. Was recently seen here for same thing last week. States he tried to get in at the urology clinic in [place] today but they couldn't see him.

## 2014-12-29 NOTE — Anesthesia Preprocedure Evaluation (Signed)
Anesthesia Evaluation  Patient identified by MRN, date of birth, ID band Patient awake    Reviewed: Allergy & Precautions, NPO status , Patient's Chart, lab work & pertinent test results  Airway Mallampati: III  TM Distance: >3 FB     Dental  (+) Teeth Intact   Pulmonary asthma , sleep apnea , former smoker,    breath sounds clear to auscultation       Cardiovascular hypertension, Pt. on medications + CAD, + Past MI and +CHF  + dysrhythmias Atrial Fibrillation  Rhythm:Regular Rate:Normal     Neuro/Psych PSYCHIATRIC DISORDERS Depression Bipolar Disorder    GI/Hepatic   Endo/Other    Renal/GU      Musculoskeletal   Abdominal   Peds  Hematology   Anesthesia Other Findings   Reproductive/Obstetrics                             Anesthesia Physical Anesthesia Plan  ASA: III  Anesthesia Plan: MAC   Post-op Pain Management:    Induction: Intravenous  Airway Management Planned: Nasal Cannula  Additional Equipment:   Intra-op Plan:   Post-operative Plan:   Informed Consent: I have reviewed the patients History and Physical, chart, labs and discussed the procedure including the risks, benefits and alternatives for the proposed anesthesia with the patient or authorized representative who has indicated his/her understanding and acceptance.     Plan Discussed with:   Anesthesia Plan Comments:         Anesthesia Quick Evaluation

## 2014-12-30 ENCOUNTER — Telehealth (HOSPITAL_BASED_OUTPATIENT_CLINIC_OR_DEPARTMENT_OTHER): Payer: Self-pay | Admitting: Emergency Medicine

## 2014-12-30 ENCOUNTER — Encounter (HOSPITAL_COMMUNITY): Payer: Self-pay | Admitting: Ophthalmology

## 2014-12-30 NOTE — Telephone Encounter (Signed)
Post ED Visit - Positive Culture Follow-up  Culture report reviewed by antimicrobial stewardship pharmacist:  [x]  Elenor Quinones, Pharm.D. []  Heide Guile, Pharm.D., BCPS []  Parks Neptune, Pharm.D. []  Alycia Rossetti, Pharm.D., BCPS []  Farina, Pharm.D., BCPS, AAHIVP []  Legrand Como, Pharm.D., BCPS, AAHIVP []  Milus Glazier, Pharm.D. []  Stephens November, Pharm.D.  Positive urine culture  Treated with cephalexin, organism sensitive to the same and no further patient follow-up is required at this time, report faxed to [phone]  Hazle Nordmann 12/30/2014, 10:53 AM

## 2014-12-30 NOTE — Progress Notes (Signed)
ED Antimicrobial Stewardship Positive Culture Follow Up   Grant Stevens is an 74 y.o. male who presented to [facility] on 12/27/2014 with a chief complaint of  Chief Complaint  Patient presents with  . leaking around foley     Recent Results (from the past 720 hour(s))  Urine culture     Status: None   Collection Time: 12/27/14  7:54 AM  Result Value Ref Range Status   Specimen Description URINE, CATHETERIZED  Final   Special Requests NONE  Final   Culture >=100,000 COLONIES/mL ENTEROCOCCUS SPECIES  Final   Report Status 12/29/2014 FINAL  Final   Organism ID, Bacteria ENTEROCOCCUS SPECIES  Final      Susceptibility   Enterococcus species - MIC*    AMPICILLIN <=2 SENSITIVE Sensitive     LEVOFLOXACIN 0.5 SENSITIVE Sensitive     NITROFURANTOIN <=16 SENSITIVE Sensitive     VANCOMYCIN 1 SENSITIVE Sensitive     * >=100,000 COLONIES/mL ENTEROCOCCUS SPECIES    [x]  Treated with Keflex, organism resistant to prescribed antimicrobial  Chronic foley catheter, could be contamination vs. Possible infection. No s/sx of UTI  Plan: Call results to Urologist office (Dr. Noah Delaine)  ED Provider: Delrae Rend PA-C   Reginia Naas 12/30/2014, 8:59 AM Infectious Diseases Pharmacist Phone# [phone]

## 2014-12-31 ENCOUNTER — Ambulatory Visit (INDEPENDENT_AMBULATORY_CARE_PROVIDER_SITE_OTHER): Payer: Medicare Other | Admitting: Urology

## 2014-12-31 DIAGNOSIS — R3915 Urgency of urination: Secondary | ICD-10-CM

## 2014-12-31 DIAGNOSIS — R351 Nocturia: Secondary | ICD-10-CM

## 2014-12-31 DIAGNOSIS — N401 Enlarged prostate with lower urinary tract symptoms: Secondary | ICD-10-CM | POA: Diagnosis not present

## 2015-01-01 ENCOUNTER — Encounter (HOSPITAL_COMMUNITY): Payer: Self-pay | Admitting: Ophthalmology

## 2015-01-01 LAB — URINE CULTURE

## 2015-01-03 ENCOUNTER — Emergency Department (HOSPITAL_COMMUNITY)
Admission: EM | Admit: 2015-01-03 | Discharge: 2015-01-03 | Discharge status: Against Medical Advice | Payer: Medicare Other | Attending: Emergency Medicine | Admitting: Emergency Medicine

## 2015-01-03 ENCOUNTER — Encounter (HOSPITAL_COMMUNITY): Payer: Self-pay | Admitting: Emergency Medicine

## 2015-01-03 DIAGNOSIS — I2510 Atherosclerotic heart disease of native coronary artery without angina pectoris: Secondary | ICD-10-CM | POA: Diagnosis not present

## 2015-01-03 DIAGNOSIS — R1030 Lower abdominal pain, unspecified: Secondary | ICD-10-CM | POA: Diagnosis not present

## 2015-01-03 DIAGNOSIS — R339 Retention of urine, unspecified: Secondary | ICD-10-CM | POA: Diagnosis not present

## 2015-01-03 DIAGNOSIS — I509 Heart failure, unspecified: Secondary | ICD-10-CM | POA: Diagnosis not present

## 2015-01-03 DIAGNOSIS — I10 Essential (primary) hypertension: Secondary | ICD-10-CM | POA: Insufficient documentation

## 2015-01-03 NOTE — ED Notes (Signed)
Pt c/o severe pain in lower abd -- bladder scan done-- > 500cc noted, while doing bladder scan, catheter was draining without difficulty. 900cc emptied from bag-- pt in no pain at present, wants to go home. Instructed on using regular foley bag at night-- has not been, has been keeping leg bag on 24 hours/day. Has appt with Dr. Alyson Ingles (urologist) on 1/21.

## 2015-01-03 NOTE — ED Notes (Signed)
Pt has foley in place-- has been in place for 1 week-- stopped draining approx 1 hour ago-- has urgency, but no urine in bag.

## 2015-01-05 NOTE — ED Provider Notes (Signed)
CSN: [id number]     Arrival date & time 12/23/14  0117 History   First MD Initiated Contact with Patient 12/23/14 0404     Chief Complaint  Patient presents with  . Leg Swelling     (Consider location/radiation/quality/duration/timing/severity/associated sxs/prior Treatment) HPI   74 year old male with leaking from his urinary catheter to leg bag. Patient with a long-standing history of indwelling catheter. ` And leaking last night. Persistent since then. No other acute complaints. No fevers or chills. No blood in his urine. No leakage around the catheter at the penis. No acute abdominal pain.  Past Medical History  Diagnosis Date  . Coronary atherosclerosis of native coronary artery     Multivessel s/p CABG in 06/2001 post-IMI, LVEF 35% up to 50% postoperatively;  . Essential hypertension, benign   . Hyperlipidemia   . Sleep apnea   . History of bipolar disorder   . Allergic rhinitis   . Asthma   . Erectile dysfunction   . Atrial fibrillation ([place])     Remote history - WARCEF  . Myocardial infarction ([place])   . Bipolar 1 disorder ([place])   . Depression   . CHF (congestive heart failure) [facility])    Past Surgical History  Procedure Laterality Date  . Coronary artery bypass graft  08/2000    Dr. Servando Snare - LIMA to LAD, SVG to diagonal, SVG to PDA  . Tonsillectomy    . Circumcision  10/2003  . Colonoscopy N/A 07/11/2012    Procedure: COLONOSCOPY;  Surgeon: Rogene Houston, MD;  Location: [facility] ENDO [facility];  Service: Endoscopy;  Laterality: N/A;  830-moved to 930 Ann to notify pt  . Cataract extraction w/phaco Left 12/29/2014    Procedure: CATARACT EXTRACTION PHACO AND INTRAOCULAR LENS PLACEMENT (IOC);  Surgeon: Williams Che, MD;  Location: [facility] ORS;  Service: Ophthalmology;  Laterality: Left;  CDE:3.71   Family History  Problem Relation Age of Onset  . Asthma Mother   . Heart attack Father 32  . Diabetes Brother    Social History  Substance Use Topics  . Smoking status: Former  Smoker -- 1.00 packs/day for 15 years    Types: Cigarettes    Quit date: 01/11/1971  . Smokeless tobacco: None  . Alcohol Use: No    Review of Systems  All systems reviewed and negative, other than as noted in HPI.   Allergies  Review of patient's allergies indicates no known allergies.  Home Medications   Prior to Admission medications   Medication Sig Start Date End Date Taking? Authorizing Provider  aspirin EC 81 MG tablet Take 81 mg by mouth daily.   Yes Historical Provider, MD  atorvastatin (LIPITOR) 80 MG tablet Take 80 mg by mouth at bedtime.     Yes Historical Provider, MD  carbamazepine (TEGRETOL) 200 MG tablet Take 400 mg by mouth 2 (two) times daily.    Yes Historical Provider, MD  divalproex (DEPAKOTE) 250 MG DR tablet Take 500 mg by mouth every morning.    Yes Historical Provider, MD  furosemide (LASIX) 20 MG tablet Take 20 mg by mouth daily.   Yes Historical Provider, MD  lisinopril (PRINIVIL,ZESTRIL) 5 MG tablet TAKE ONE TABLET BY MOUTH ONCE DAILY. 09/05/14  Yes [name], MD  risperiDONE (RISPERDAL) 1 MG tablet Take 1-2 mg by mouth 2 (two) times daily. Takes 1 tablet in the morning Take 2 tablets at night   Yes Historical Provider, MD  temazepam (RESTORIL) 15 MG capsule Take 1 capsule (15  mg total) by mouth at bedtime as needed for sleep. 12/05/14  Yes Harvel Quale, MD  ARIPiprazole (ABILIFY) 10 MG tablet Take 10 mg by mouth daily. 12/24/14   Historical Provider, MD  cephALEXin (KEFLEX) 500 MG capsule Take 1 capsule (500 mg total) by mouth 4 (four) times daily. 12/27/14   Kristen N Ward, DO  gatifloxacin (ZYMAXID) 0.5 % SOLN 1 drop as directed. In preparation for Cataract surgery. To start course on 12/25/2014 12/11/14   Historical Provider, MD  ketorolac (ACULAR) 0.5 % ophthalmic solution 1 drop See admin instructions. In preparation for Cataract surgery. To start course on 12/25/2014 12/11/14   Historical Provider, MD  prednisoLONE acetate (PRED FORTE) 1 %  ophthalmic suspension In preparation for Cataract surgery. To start course on 12/25/2014 12/11/14   Historical Provider, MD   BP 163/93 mmHg  Pulse 80  Temp(Src) 97.7 F (36.5 C) (Oral)  Resp 20  Ht 6' (1.829 m)  Wt 233 lb (105.688 kg)  BMI 31.59 kg/m2  SpO2 99% Physical Exam  Constitutional: He appears well-developed and well-nourished. No distress.  HENT:  Head: Normocephalic and atraumatic.  Eyes: Conjunctivae are normal. Right eye exhibits no discharge. Left eye exhibits no discharge.  Neck: Neck supple.  Cardiovascular: Normal rate, regular rhythm and normal heart sounds.  Exam reveals no gallop and no friction rub.   No murmur heard. Pulmonary/Chest: Effort normal and breath sounds normal. No respiratory distress.  Abdominal: Soft. He exhibits no distension. There is no tenderness.  Genitourinary:  Foley to leg bag. The valve drainage valve is not closing completely and there is leakage of urine from it.  Musculoskeletal: He exhibits no edema or tenderness.  Neurological: He is alert.  Skin: Skin is warm and dry.  Psychiatric: He has a normal mood and affect. His behavior is normal. Thought content normal.  Nursing note and vitals reviewed.   ED Course  Procedures (including critical care time) Labs Review Labs Reviewed - No data to display  Imaging Review No results found. I have personally reviewed and evaluated these images and lab results as part of my medical decision-making.   EKG Interpretation None      MDM   Final diagnoses:  Chronic indwelling Foley catheter  Bilateral edema of lower extremity    73 year old male with leaking leg bag from urinary catheter. New bag was provided. No acute complaints otherwise. Outpatient follow-up.    Virgel Manifold, MD 01/05/15 [phone]

## 2015-01-06 ENCOUNTER — Encounter (HOSPITAL_COMMUNITY): Payer: Self-pay | Admitting: Emergency Medicine

## 2015-01-06 ENCOUNTER — Emergency Department (HOSPITAL_COMMUNITY)
Admission: EM | Admit: 2015-01-06 | Discharge: 2015-01-06 | Disposition: A | Discharge status: Home / Self Care | Payer: Medicare Other | Attending: Emergency Medicine | Admitting: Emergency Medicine

## 2015-01-06 DIAGNOSIS — Z9889 Other specified postprocedural states: Secondary | ICD-10-CM | POA: Insufficient documentation

## 2015-01-06 DIAGNOSIS — F319 Bipolar disorder, unspecified: Secondary | ICD-10-CM | POA: Diagnosis not present

## 2015-01-06 DIAGNOSIS — J45909 Unspecified asthma, uncomplicated: Secondary | ICD-10-CM | POA: Insufficient documentation

## 2015-01-06 DIAGNOSIS — L821 Other seborrheic keratosis: Secondary | ICD-10-CM | POA: Diagnosis not present

## 2015-01-06 DIAGNOSIS — I509 Heart failure, unspecified: Secondary | ICD-10-CM | POA: Insufficient documentation

## 2015-01-06 DIAGNOSIS — Z87891 Personal history of nicotine dependence: Secondary | ICD-10-CM | POA: Diagnosis not present

## 2015-01-06 DIAGNOSIS — N390 Urinary tract infection, site not specified: Secondary | ICD-10-CM | POA: Insufficient documentation

## 2015-01-06 DIAGNOSIS — Z7982 Long term (current) use of aspirin: Secondary | ICD-10-CM | POA: Diagnosis not present

## 2015-01-06 DIAGNOSIS — T83038A Leakage of other indwelling urethral catheter, initial encounter: Secondary | ICD-10-CM | POA: Diagnosis not present

## 2015-01-06 DIAGNOSIS — Z79899 Other long term (current) drug therapy: Secondary | ICD-10-CM | POA: Insufficient documentation

## 2015-01-06 DIAGNOSIS — T839XXA Unspecified complication of genitourinary prosthetic device, implant and graft, initial encounter: Secondary | ICD-10-CM

## 2015-01-06 DIAGNOSIS — Z8669 Personal history of other diseases of the nervous system and sense organs: Secondary | ICD-10-CM | POA: Insufficient documentation

## 2015-01-06 DIAGNOSIS — I10 Essential (primary) hypertension: Secondary | ICD-10-CM | POA: Diagnosis not present

## 2015-01-06 DIAGNOSIS — Z87438 Personal history of other diseases of male genital organs: Secondary | ICD-10-CM | POA: Diagnosis not present

## 2015-01-06 DIAGNOSIS — D225 Melanocytic nevi of trunk: Secondary | ICD-10-CM | POA: Diagnosis not present

## 2015-01-06 DIAGNOSIS — B078 Other viral warts: Secondary | ICD-10-CM | POA: Diagnosis not present

## 2015-01-06 DIAGNOSIS — I252 Old myocardial infarction: Secondary | ICD-10-CM | POA: Insufficient documentation

## 2015-01-06 DIAGNOSIS — T83098A Other mechanical complication of other indwelling urethral catheter, initial encounter: Secondary | ICD-10-CM | POA: Diagnosis not present

## 2015-01-06 DIAGNOSIS — Y658 Other specified misadventures during surgical and medical care: Secondary | ICD-10-CM | POA: Diagnosis not present

## 2015-01-06 DIAGNOSIS — Z792 Long term (current) use of antibiotics: Secondary | ICD-10-CM | POA: Insufficient documentation

## 2015-01-06 LAB — URINE MICROSCOPIC-ADD ON

## 2015-01-06 LAB — URINALYSIS, ROUTINE W REFLEX MICROSCOPIC
Bilirubin Urine: NEGATIVE
Glucose, UA: NEGATIVE mg/dL
Ketones, ur: 15 mg/dL — AB
Nitrite: NEGATIVE
PROTEIN: 100 mg/dL — AB
Specific Gravity, Urine: 1.018 (ref 1.005–1.030)
pH: 7.5 (ref 5.0–8.0)

## 2015-01-06 MED ORDER — CEPHALEXIN 500 MG PO CAPS: 500.0000 mg | ORAL_CAPSULE | Freq: Four times a day (QID) | ORAL | Status: DC

## 2015-01-06 NOTE — ED Notes (Signed)
Loosened patient's leg strap for foley and urine began draining much more effectively. Leakage around penis stopped.

## 2015-01-06 NOTE — ED Notes (Signed)
PA at bedside.

## 2015-01-06 NOTE — ED Provider Notes (Signed)
CSN: [id number]     Arrival date & time 01/06/15  0147 History   First MD Initiated Contact with Patient 01/06/15 [phone]     Chief Complaint  Patient presents with  . leaking urinary catheter.      (Consider location/radiation/quality/duration/timing/severity/associated sxs/prior Treatment) The history is provided by the patient and medical records.    74 y.o. M with hx of HTN, HLP, sleep apnea, allergic rhinitis, bipolar disorder, depression, CHF, AFIB, presenting to the ED for issues with his foley catheter.  Patient has been seen in the ED multiple times for the same. He had a chronic indwelling Foley for the past several months and frequently increases leakage from either the back or around penis. He states he was not having any issues until he attempted to change from day leg bag to nighttime bag to accommodate large amounts of urine as his urologist has instructed him to do.  He states he began having leakage which he thinks was coming from around the penis.  He denies fever, chills, sweats, or abdominal pain.  He states his urine has had a foul smell when he emptied his bag for the past few days.  Patient was given a prescription for Keflex a few weeks ago, however he never got this filled because his primary care physician told him he did not need it.  Vital signs stable on arrival.  Past Medical History  Diagnosis Date  . Coronary atherosclerosis of native coronary artery     Multivessel s/p CABG in 06/2001 post-IMI, LVEF 35% up to 50% postoperatively;  . Essential hypertension, benign   . Hyperlipidemia   . Sleep apnea   . History of bipolar disorder   . Allergic rhinitis   . Asthma   . Erectile dysfunction   . Atrial fibrillation ([place])     Remote history - WARCEF  . Myocardial infarction ([place])   . Bipolar 1 disorder ([place])   . Depression   . CHF (congestive heart failure) [facility])    Past Surgical History  Procedure Laterality Date  . Coronary artery bypass graft  08/2000     Dr. Servando Snare - LIMA to LAD, SVG to diagonal, SVG to PDA  . Tonsillectomy    . Circumcision  10/2003  . Colonoscopy N/A 07/11/2012    Procedure: COLONOSCOPY;  Surgeon: Rogene Houston, MD;  Location: [facility];  Service: Endoscopy;  Laterality: N/A;  830-moved to 930 Ann to notify pt  . Cataract extraction w/phaco Left 12/29/2014    Procedure: CATARACT EXTRACTION PHACO AND INTRAOCULAR LENS PLACEMENT (IOC);  Surgeon: Williams Che, MD;  Location: [facility] ORS;  Service: Ophthalmology;  Laterality: Left;  CDE:3.71   Family History  Problem Relation Age of Onset  . Asthma Mother   . Heart attack Father 65  . Diabetes Brother    Social History  Substance Use Topics  . Smoking status: Former Smoker -- 1.00 packs/day for 15 years    Types: Cigarettes    Quit date: 01/11/1971  . Smokeless tobacco: None  . Alcohol Use: No    Review of Systems  Genitourinary:       Leaking Foley  All other systems reviewed and are negative.     Allergies  Review of patient's allergies indicates no known allergies.  Home Medications   Prior to Admission medications   Medication Sig Start Date End Date Taking? Authorizing Provider  ARIPiprazole (ABILIFY) 10 MG tablet Take 10 mg by mouth daily. 12/24/14   Historical Provider,  MD  aspirin EC 81 MG tablet Take 81 mg by mouth daily.    Historical Provider, MD  atorvastatin (LIPITOR) 80 MG tablet Take 80 mg by mouth at bedtime.      Historical Provider, MD  carbamazepine (TEGRETOL) 200 MG tablet Take 400 mg by mouth 2 (two) times daily.     Historical Provider, MD  cephALEXin (KEFLEX) 500 MG capsule Take 1 capsule (500 mg total) by mouth 4 (four) times daily. 12/27/14   Kristen N Ward, DO  divalproex (DEPAKOTE) 250 MG DR tablet Take 500 mg by mouth every morning.     Historical Provider, MD  furosemide (LASIX) 20 MG tablet Take 20 mg by mouth daily.    Historical Provider, MD  gatifloxacin (ZYMAXID) 0.5 % SOLN 1 drop as directed. In preparation for  Cataract surgery. To start course on 12/25/2014 12/11/14   Historical Provider, MD  ketorolac (ACULAR) 0.5 % ophthalmic solution 1 drop See admin instructions. In preparation for Cataract surgery. To start course on 12/25/2014 12/11/14   Historical Provider, MD  lisinopril (PRINIVIL,ZESTRIL) 5 MG tablet TAKE ONE TABLET BY MOUTH ONCE DAILY. 09/05/14   [name], MD  prednisoLONE acetate (PRED FORTE) 1 % ophthalmic suspension In preparation for Cataract surgery. To start course on 12/25/2014 12/11/14   Historical Provider, MD  risperiDONE (RISPERDAL) 1 MG tablet Take 1-2 mg by mouth 2 (two) times daily. Takes 1 tablet in the morning Take 2 tablets at night    Historical Provider, MD  temazepam (RESTORIL) 15 MG capsule Take 1 capsule (15 mg total) by mouth at bedtime as needed for sleep. 12/05/14   Harvel Quale, MD   BP 139/79 mmHg  Pulse 82  Temp(Src) 97.4 F (36.3 C) (Oral)  Resp 20  Ht 6' (1.829 m)  Wt 105.688 kg  BMI 31.59 kg/m2  SpO2 97%   Physical Exam  Constitutional: He is oriented to person, place, and time. He appears well-developed and well-nourished. No distress.  HENT:  Head: Normocephalic and atraumatic.  Mouth/Throat: Oropharynx is clear and moist.  Eyes: Conjunctivae and EOM are normal. Pupils are equal, round, and reactive to light.  Neck: Normal range of motion. Neck supple.  Cardiovascular: Normal rate, regular rhythm and normal heart sounds.   Pulmonary/Chest: Effort normal and breath sounds normal. No respiratory distress. He has no wheezes.  Abdominal: Soft. Bowel sounds are normal. There is no tenderness. There is no guarding.  Genitourinary:  Foley catheter in place, no leakage at present, brief underwear is dry without signs of dried urine; yellow urine is draining freely into the leg bag  Musculoskeletal: Normal range of motion.  Neurological: He is alert and oriented to person, place, and time.  Awake, alert, oriented x3  Skin: Skin is warm and dry. He  is not diaphoretic.  Psychiatric: He has a normal mood and affect.  Nursing note and vitals reviewed.   ED Course  Procedures (including critical care time) Labs Review Labs Reviewed  URINALYSIS, ROUTINE W REFLEX MICROSCOPIC (NOT AT [facility]) - Abnormal; Notable for the following:    APPearance TURBID (*)    Hgb urine dipstick MODERATE (*)    Ketones, ur 15 (*)    Protein, ur 100 (*)    Leukocytes, UA LARGE (*)    All other components within normal limits  URINE MICROSCOPIC-ADD ON - Abnormal; Notable for the following:    Squamous Epithelial / LPF 0-5 (*)    Bacteria, UA MANY (*)    Crystals  TRIPLE PHOSPHATE CRYSTALS (*)    All other components within normal limits  URINE CULTURE    Imaging Review No results found. I have personally reviewed and evaluated these images and lab results as part of my medical decision-making.   EKG Interpretation None      MDM   Final diagnoses:  Foley catheter problem, initial encounter [facility])  UTI (lower urinary tract infection)   74 year old male here with leaking from Foley catheter. Seen in the ED frequently for the same. He apparently began having leakage after attempting to change his leg bag to nighttime bag as instructed by his urologist. On arrival in the ED, leg bag was adjusted and loosened with cessation of leakage.  Patient's abdominal exam is benign.  He was complaining of foul smelling urine therefore UA was obtained and appears infectious.   His VS are stable, patient oriented to baseline, and there are no signs of sepsis.  Feel patient is appropriate for OP management.  Will start keflex pending urine culture. Patient to follow-up with PCP.  Discussed plan with patient, he/she acknowledged understanding and agreed with plan of care.  Return precautions given for new or worsening symptoms.  Larene Pickett, PA-C 01/06/15 [id number]  Everlene Balls, MD 01/06/15 [phone]

## 2015-01-06 NOTE — Discharge Instructions (Signed)
Take the prescribed medication as directed. °Follow-up with your primary care physician. °Return to the ED for new or worsening symptoms. ° °

## 2015-01-06 NOTE — ED Notes (Signed)
Pt has urinary catheter in place x 6 weeks. pt reports catheter is leaking around penis. sts some urine is still draining into bag. Pt has noticed odor to urine. Pt did not get antibiotic filled because pcp said he did not need it.

## 2015-01-09 LAB — URINE CULTURE

## 2015-01-10 ENCOUNTER — Telehealth (HOSPITAL_COMMUNITY): Payer: Self-pay

## 2015-01-10 NOTE — Telephone Encounter (Signed)
Post ED Visit - Positive Culture Follow-up: Chart Hand-off to ED Flow Manager  Culture assessed and recommendations reviewed by: []  Levester Fresh, Pharm.D., BCPS []  Heide Guile, Pharm.D., BCPS-AQ ID []  Alycia Rossetti, Pharm.D., BCPS []  Tangent, Pharm.D., BCPS, AAHIVP []  Legrand Como, Pharm .D., BCPS, AAHIVP [x]  Milus Glazier, Pharm.D. []  Dimitri Ped, Pharm.D.  Positive urine culture  []  Patient discharged without antimicrobial prescription and treatment is now indicated []  Organism is resistant to prescribed ED discharge antimicrobial []  Patient with positive blood cultures  Changes discussed with ED provider: geiple pa New antibiotic prescription no further treatment needed at this time   Ileene Musa 01/10/2015, 12:02 PM

## 2015-01-10 NOTE — Progress Notes (Signed)
ED Antimicrobial Stewardship Positive Culture Follow Up   Grant Stevens is an 74 y.o. male who presented to [facility] on 01/06/2015 with a chief complaint of  Chief Complaint  Patient presents with  . leaking urinary catheter.     Recent Results (from the past 720 hour(s))  Urine culture     Status: None   Collection Time: 12/27/14  7:54 AM  Result Value Ref Range Status   Specimen Description URINE, CATHETERIZED  Final   Special Requests NONE  Final   Culture >=100,000 COLONIES/mL ENTEROCOCCUS SPECIES  Final   Report Status 12/29/2014 FINAL  Final   Organism ID, Bacteria ENTEROCOCCUS SPECIES  Final      Susceptibility   Enterococcus species - MIC*    AMPICILLIN <=2 SENSITIVE Sensitive     LEVOFLOXACIN 0.5 SENSITIVE Sensitive     NITROFURANTOIN <=16 SENSITIVE Sensitive     VANCOMYCIN 1 SENSITIVE Sensitive     * >=100,000 COLONIES/mL ENTEROCOCCUS SPECIES  Urine culture     Status: None   Collection Time: 12/29/14  9:05 PM  Result Value Ref Range Status   Specimen Description URINE, CATHETERIZED  Final   Special Requests NONE  Final   Culture   Final    >=100,000 COLONIES/mL ENTEROCOCCUS SPECIES Performed at [facility]    Report Status 01/01/2015 FINAL  Final   Organism ID, Bacteria ENTEROCOCCUS SPECIES  Final      Susceptibility   Enterococcus species - MIC*    AMPICILLIN <=2 SENSITIVE Sensitive     LEVOFLOXACIN 0.5 SENSITIVE Sensitive     NITROFURANTOIN <=16 SENSITIVE Sensitive     VANCOMYCIN 1 SENSITIVE Sensitive     * >=100,000 COLONIES/mL ENTEROCOCCUS SPECIES  Urine culture     Status: None   Collection Time: 01/06/15  4:06 AM  Result Value Ref Range Status   Specimen Description URINE, CATHETERIZED  Final   Special Requests NONE  Final   Culture   Final    >=100,000 COLONIES/mL PROTEUS MIRABILIS >=100,000 COLONIES/mL ENTEROCOCCUS SPECIES    Report Status 01/09/2015 FINAL  Final   Organism ID, Bacteria PROTEUS MIRABILIS  Final   Organism ID,  Bacteria ENTEROCOCCUS SPECIES  Final      Susceptibility   Proteus mirabilis - MIC*    AMPICILLIN <=2 SENSITIVE Sensitive     CEFAZOLIN <=4 SENSITIVE Sensitive     CEFTRIAXONE <=1 SENSITIVE Sensitive     CIPROFLOXACIN <=0.25 SENSITIVE Sensitive     GENTAMICIN <=1 SENSITIVE Sensitive     IMIPENEM 4 SENSITIVE Sensitive     NITROFURANTOIN 128 RESISTANT Resistant     TRIMETH/SULFA <=20 SENSITIVE Sensitive     AMPICILLIN/SULBACTAM <=2 SENSITIVE Sensitive     PIP/TAZO <=4 SENSITIVE Sensitive     * >=100,000 COLONIES/mL PROTEUS MIRABILIS   Enterococcus species - MIC*    AMPICILLIN <=2 SENSITIVE Sensitive     LEVOFLOXACIN 0.5 SENSITIVE Sensitive     NITROFURANTOIN <=16 SENSITIVE Sensitive     VANCOMYCIN 1 SENSITIVE Sensitive     * >=100,000 COLONIES/mL ENTEROCOCCUS SPECIES    No symptoms of a UTI. No further treatment indicated.  ED Provider: Alecia Lemming, PA-C  Grant Stevens, PharmD PGY2 Infectious Diseases Pharmacy Resident Pager: [phone] 01/10/2015 10:45 AM

## 2015-01-13 ENCOUNTER — Other Ambulatory Visit: Payer: Self-pay | Admitting: Urology

## 2015-01-16 DIAGNOSIS — Z79899 Other long term (current) drug therapy: Secondary | ICD-10-CM | POA: Diagnosis not present

## 2015-01-18 ENCOUNTER — Emergency Department (HOSPITAL_COMMUNITY): Payer: PPO

## 2015-01-18 ENCOUNTER — Emergency Department (HOSPITAL_COMMUNITY)
Admission: EM | Admit: 2015-01-18 | Discharge: 2015-01-19 | Disposition: A | Discharge status: Home / Self Care | Payer: PPO | Attending: Emergency Medicine | Admitting: Emergency Medicine

## 2015-01-18 ENCOUNTER — Encounter (HOSPITAL_COMMUNITY): Payer: Self-pay | Admitting: *Deleted

## 2015-01-18 DIAGNOSIS — Z79899 Other long term (current) drug therapy: Secondary | ICD-10-CM | POA: Insufficient documentation

## 2015-01-18 DIAGNOSIS — E785 Hyperlipidemia, unspecified: Secondary | ICD-10-CM | POA: Insufficient documentation

## 2015-01-18 DIAGNOSIS — F319 Bipolar disorder, unspecified: Secondary | ICD-10-CM | POA: Insufficient documentation

## 2015-01-18 DIAGNOSIS — J45901 Unspecified asthma with (acute) exacerbation: Secondary | ICD-10-CM | POA: Insufficient documentation

## 2015-01-18 DIAGNOSIS — Z951 Presence of aortocoronary bypass graft: Secondary | ICD-10-CM | POA: Diagnosis not present

## 2015-01-18 DIAGNOSIS — Z7982 Long term (current) use of aspirin: Secondary | ICD-10-CM | POA: Insufficient documentation

## 2015-01-18 DIAGNOSIS — I2510 Atherosclerotic heart disease of native coronary artery without angina pectoris: Secondary | ICD-10-CM | POA: Diagnosis not present

## 2015-01-18 DIAGNOSIS — I4891 Unspecified atrial fibrillation: Secondary | ICD-10-CM | POA: Insufficient documentation

## 2015-01-18 DIAGNOSIS — Z87430 Personal history of prostatic dysplasia: Secondary | ICD-10-CM | POA: Diagnosis not present

## 2015-01-18 DIAGNOSIS — R0602 Shortness of breath: Secondary | ICD-10-CM | POA: Diagnosis not present

## 2015-01-18 DIAGNOSIS — I252 Old myocardial infarction: Secondary | ICD-10-CM | POA: Insufficient documentation

## 2015-01-18 DIAGNOSIS — I509 Heart failure, unspecified: Secondary | ICD-10-CM | POA: Insufficient documentation

## 2015-01-18 DIAGNOSIS — I10 Essential (primary) hypertension: Secondary | ICD-10-CM | POA: Diagnosis not present

## 2015-01-18 DIAGNOSIS — Z87891 Personal history of nicotine dependence: Secondary | ICD-10-CM | POA: Insufficient documentation

## 2015-01-18 DIAGNOSIS — R600 Localized edema: Secondary | ICD-10-CM | POA: Insufficient documentation

## 2015-01-18 DIAGNOSIS — R609 Edema, unspecified: Secondary | ICD-10-CM

## 2015-01-18 DIAGNOSIS — M7989 Other specified soft tissue disorders: Secondary | ICD-10-CM | POA: Diagnosis not present

## 2015-01-18 LAB — CBC WITH DIFFERENTIAL/PLATELET
Basophils Absolute: 0 10*3/uL (ref 0.0–0.1)
Basophils Relative: 0 %
EOS ABS: 0.2 10*3/uL (ref 0.0–0.7)
EOS PCT: 2 %
HCT: 35.3 % — ABNORMAL LOW (ref 39.0–52.0)
Hemoglobin: 11.5 g/dL — ABNORMAL LOW (ref 13.0–17.0)
LYMPHS ABS: 0.9 10*3/uL (ref 0.7–4.0)
LYMPHS PCT: 11 %
MCH: 30.6 pg (ref 26.0–34.0)
MCHC: 32.6 g/dL (ref 30.0–36.0)
MCV: 93.9 fL (ref 78.0–100.0)
Monocytes Absolute: 0.7 10*3/uL (ref 0.1–1.0)
Monocytes Relative: 8 %
NEUTROS ABS: 6.8 10*3/uL (ref 1.7–7.7)
Neutrophils Relative %: 79 %
PLATELETS: 197 10*3/uL (ref 150–400)
RBC: 3.76 MIL/uL — AB (ref 4.22–5.81)
RDW: 13.7 % (ref 11.5–15.5)
WBC: 8.7 10*3/uL (ref 4.0–10.5)

## 2015-01-18 LAB — COMPREHENSIVE METABOLIC PANEL
ALBUMIN: 3.6 g/dL (ref 3.5–5.0)
ALT: 20 U/L (ref 17–63)
ANION GAP: 9 (ref 5–15)
AST: 26 U/L (ref 15–41)
Alkaline Phosphatase: 107 U/L (ref 38–126)
BILIRUBIN TOTAL: 0.5 mg/dL (ref 0.3–1.2)
BUN: 29 mg/dL — AB (ref 6–20)
CHLORIDE: 102 mmol/L (ref 101–111)
CO2: 28 mmol/L (ref 22–32)
Calcium: 8.7 mg/dL — ABNORMAL LOW (ref 8.9–10.3)
Creatinine, Ser: 1.11 mg/dL (ref 0.61–1.24)
GFR calc Af Amer: >60 mL/min (ref >60)
GFR calc non Af Amer: >60 mL/min (ref >60)
GLUCOSE: 107 mg/dL — AB (ref 65–99)
POTASSIUM: 4.2 mmol/L (ref 3.5–5.1)
SODIUM: 139 mmol/L (ref 135–145)
TOTAL PROTEIN: 6.7 g/dL (ref 6.5–8.1)

## 2015-01-18 LAB — TROPONIN I: TROPONIN I: 0.03 ng/mL (ref <0.031)

## 2015-01-18 LAB — BRAIN NATRIURETIC PEPTIDE: B NATRIURETIC PEPTIDE 5: 183 pg/mL — AB (ref 0.0–100.0)

## 2015-01-18 MED ORDER — FUROSEMIDE 10 MG/ML IJ SOLN: 80.0000 mg | Freq: Once | INTRAMUSCULAR | Status: DC

## 2015-01-18 MED ORDER — NITROGLYCERIN 2 % TD OINT
1.0000 [in_us] | TOPICAL_OINTMENT | Freq: Once | TRANSDERMAL | Status: AC
  Administered 2015-01-19: 1 [in_us] via TOPICAL
  Filled 2015-01-18: qty 1

## 2015-01-18 MED ORDER — FUROSEMIDE 10 MG/ML IJ SOLN
80.0000 mg | Freq: Once | INTRAMUSCULAR | Status: AC
  Administered 2015-01-18: 80 mg via INTRAVENOUS
  Filled 2015-01-18: qty 8

## 2015-01-18 NOTE — ED Notes (Signed)
Pt states legs has been swelling over the past few days. Pt says feet normally swell when he is up on them. Has been getting in wood today. Pt is taking 2 lasix a day in the morning per his PCP.

## 2015-01-18 NOTE — ED Provider Notes (Signed)
CSN: [id number]     Arrival date & time 01/18/15  2158 History   First MD Initiated Contact with Patient 01/18/15 2310   Chief Complaint  Patient presents with  . Leg Swelling     (Consider location/radiation/quality/duration/timing/severity/associated sxs/prior Treatment) HPI patient reports his feet have been swelling for several weeks and his leg started swelling 2 or 3 days ago. He states he started getting some mild shortness of breath today. He denies chest pain, cough, or fever. He states in 2002 he had a MI and had enough damage that he has had congestive heart failure since. He states when he was last seen by his cardiologist in October his weight was 224. He states he weighed himself today and he was 235. He also notes he has some redness of the skin of his legs this week which he attributes to going outside to get wood on the porch that he brings in to put on his fire due to the cold. He states he increased his Lasix to 40 mg from 20 mg a day for the past 3 days without resolution of the swelling.  PCP Dr Willey Blade  Past Medical History  Diagnosis Date  . Coronary atherosclerosis of native coronary artery     Multivessel s/p CABG in 06/2001 post-IMI, LVEF 35% up to 50% postoperatively;  . Essential hypertension, benign   . Hyperlipidemia   . Sleep apnea   . History of bipolar disorder   . Allergic rhinitis   . Asthma   . Erectile dysfunction   . Atrial fibrillation ([place])     Remote history - WARCEF  . Myocardial infarction ([place])   . Bipolar 1 disorder ([place])   . Depression   . CHF (congestive heart failure) [facility])    Past Surgical History  Procedure Laterality Date  . Coronary artery bypass graft  08/2000    Dr. Servando Snare - LIMA to LAD, SVG to diagonal, SVG to PDA  . Tonsillectomy    . Circumcision  10/2003  . Colonoscopy N/A 07/11/2012    Procedure: COLONOSCOPY;  Surgeon: Rogene Houston, MD;  Location: [facility];  Service: Endoscopy;  Laterality: N/A;  830-moved to 930  Ann to notify pt  . Cataract extraction w/phaco Left 12/29/2014    Procedure: CATARACT EXTRACTION PHACO AND INTRAOCULAR LENS PLACEMENT (IOC);  Surgeon: Williams Che, MD;  Location: [facility] ORS;  Service: Ophthalmology;  Laterality: Left;  CDE:3.71   Family History  Problem Relation Age of Onset  . Asthma Mother   . Heart attack Father 73  . Diabetes Brother    Social History  Substance Use Topics  . Smoking status: Former Smoker -- 1.00 packs/day for 15 years    Types: Cigarettes    Quit date: 01/11/1971  . Smokeless tobacco: None  . Alcohol Use: No  lives alone lives at home  widower  Review of Systems  All other systems reviewed and are negative.     Allergies  Review of patient's allergies indicates no known allergies.  Home Medications   Prior to Admission medications   Medication Sig Start Date End Date Taking? Authorizing Provider  ARIPiprazole (ABILIFY) 5 MG tablet Take 10 mg by mouth daily.   Yes Historical Provider, MD  aspirin EC 81 MG tablet Take 81 mg by mouth daily.   Yes Historical Provider, MD  atorvastatin (LIPITOR) 80 MG tablet Take 80 mg by mouth at bedtime.     Yes Historical Provider, MD  carbamazepine (TEGRETOL) 200 MG  tablet Take 400 mg by mouth 2 (two) times daily.    Yes Historical Provider, MD  divalproex (DEPAKOTE) 250 MG DR tablet Take 750 mg by mouth daily.    Yes Historical Provider, MD  furosemide (LASIX) 20 MG tablet Take 20 mg by mouth daily.   Yes Historical Provider, MD  lisinopril (PRINIVIL,ZESTRIL) 5 MG tablet TAKE ONE TABLET BY MOUTH ONCE DAILY. 09/05/14  Yes [name], MD  temazepam (RESTORIL) 15 MG capsule Take 1 capsule (15 mg total) by mouth at bedtime as needed for sleep. 12/05/14  Yes Harvel Quale, MD  furosemide (LASIX) 40 MG tablet Take 2 tablets (80 mg total) by mouth daily as needed for fluid. 01/19/15   Rolland Porter, MD   BP 157/73 mmHg  Pulse 92  Temp(Src) 98.5 F (36.9 C) (Oral)  Resp 20  Ht 6' (1.829 m)  Wt 235  lb (106.595 kg)  BMI 31.86 kg/m2  SpO2 99%  Vital signs normal   Physical Exam  Constitutional: He is oriented to person, place, and time. He appears well-developed and well-nourished.  Non-toxic appearance. He does not appear ill. No distress.  HENT:  Head: Normocephalic and atraumatic.  Right Ear: External ear normal.  Left Ear: External ear normal.  Nose: Nose normal. No mucosal edema or rhinorrhea.  Mouth/Throat: Oropharynx is clear and moist and mucous membranes are normal. No dental abscesses or uvula swelling.  Eyes: Conjunctivae and EOM are normal. Pupils are equal, round, and reactive to light.  Neck: Normal range of motion and full passive range of motion without pain. Neck supple.  Cardiovascular: Normal rate, regular rhythm and normal heart sounds.  Exam reveals no gallop and no friction rub.   No murmur heard. Pulmonary/Chest: Effort normal and breath sounds normal. No respiratory distress. He has no wheezes. He has no rhonchi. He has no rales. He exhibits no tenderness and no crepitus.  Abdominal: Soft. Normal appearance and bowel sounds are normal. He exhibits no distension. There is no tenderness. There is no rebound and no guarding.  Musculoskeletal: Normal range of motion. He exhibits edema. He exhibits no tenderness.  Moves all extremities well. Patient has 2+ pitting edema up to his knees bilaterally. He has diffuse redness of the skin of his lower extremities without warmth or signs of cellulitis.  Neurological: He is alert and oriented to person, place, and time. He has normal strength. No cranial nerve deficit.  Skin: Skin is warm, dry and intact. No rash noted. No erythema. No pallor.  Psychiatric: He has a normal mood and affect. His speech is normal and behavior is normal. His mood appears not anxious.  Patient wants to talk a lot about losing his wife and living alone.  Nursing note and vitals reviewed.   ED Course  Procedures (including critical care  time)  Medications  furosemide (LASIX) injection 80 mg (80 mg Intravenous Given 01/18/15 2340)  nitroGLYCERIN (NITROGLYN) 2 % ointment 1 inch (1 inch Topical Given 01/19/15 0026)    Patient was given Lasix 80 mg IV and due to his hypertension he had nitroglycerin paste placed on his chest. Patient is being evaluated for possible congestive heart failure but hopefully he just has peripheral edema.  01:20 AMNurse reports he has had 1600 cc or urine output. Pt states he is feeling better, his left leg is much less swollen, still has some swelling in his right leg but that is the leg that donated the veins for his CABG. Feels ready to  be discharged. I discussed putting him on lasix 40 mg tabs to take 80 mg a day for 2-3 days.    Labs Review Results for orders placed or performed during the hospital encounter of 01/18/15  Comprehensive metabolic panel  Result Value Ref Range   Sodium 139 135 - 145 mmol/L   Potassium 4.2 3.5 - 5.1 mmol/L   Chloride 102 101 - 111 mmol/L   CO2 28 22 - 32 mmol/L   Glucose, Bld 107 (H) 65 - 99 mg/dL   BUN 29 (H) 6 - 20 mg/dL   Creatinine, Ser 1.11 0.61 - 1.24 mg/dL   Calcium 8.7 (L) 8.9 - 10.3 mg/dL   Total Protein 6.7 6.5 - 8.1 g/dL   Albumin 3.6 3.5 - 5.0 g/dL   AST 26 15 - 41 U/L   ALT 20 17 - 63 U/L   Alkaline Phosphatase 107 38 - 126 U/L   Total Bilirubin 0.5 0.3 - 1.2 mg/dL   GFR calc non Af Amer >60 >60 mL/min   GFR calc Af Amer >60 >60 mL/min   Anion gap 9 5 - 15  CBC with Differential  Result Value Ref Range   WBC 8.7 4.0 - 10.5 K/uL   RBC 3.76 (L) 4.22 - 5.81 MIL/uL   Hemoglobin 11.5 (L) 13.0 - 17.0 g/dL   HCT 35.3 (L) 39.0 - 52.0 %   MCV 93.9 78.0 - 100.0 fL   MCH 30.6 26.0 - 34.0 pg   MCHC 32.6 30.0 - 36.0 g/dL   RDW 13.7 11.5 - 15.5 %   Platelets 197 150 - 400 K/uL   Neutrophils Relative % 79 %   Neutro Abs 6.8 1.7 - 7.7 K/uL   Lymphocytes Relative 11 %   Lymphs Abs 0.9 0.7 - 4.0 K/uL   Monocytes Relative 8 %   Monocytes Absolute 0.7  0.1 - 1.0 K/uL   Eosinophils Relative 2 %   Eosinophils Absolute 0.2 0.0 - 0.7 K/uL   Basophils Relative 0 %   Basophils Absolute 0.0 0.0 - 0.1 K/uL  Brain natriuretic peptide  Result Value Ref Range   B Natriuretic Peptide 183.0 (H) 0.0 - 100.0 pg/mL  Troponin I  Result Value Ref Range   Troponin I 0.03 <0.031 ng/mL   Laboratory interpretation all normal except mild anemia,     Imaging Review Dg Chest 2 View  01/19/2015  CLINICAL DATA:  75 year old male with shortness of breath and CHF EXAM: CHEST  2 VIEW COMPARISON:  Chest radiograph dated 08/02/2011 FINDINGS: No focal consolidation, pleural effusion, or pneumothorax. Stable cardiac silhouette. Median sternotomy wires. No acute fracture. IMPRESSION: No active cardiopulmonary disease. Electronically Signed   By: Anner Crete M.D.   On: 01/19/2015 00:07   I have personally reviewed and evaluated these images and lab results as part of my medical decision-making.   EKG Interpretation   Date/Time:  Sunday January 18 2015 23:40:00 EST Ventricular Rate:  92 PR Interval:  226 QRS Duration: 145 QT Interval:  373 QTC Calculation: 461 R Axis:   -21 Text Interpretation:  Sinus rhythm Prolonged PR interval Right bundle  branch block Inferior infarct, age indeterminate Anterior infarct, old No  significant change since last tracing 19 Oct 2014 Confirmed by [facility]   MD-I, Malonie Tatum ([id number]) on 01/19/2015 12:18:40 AM      MDM   Final diagnoses:  Peripheral edema    New Prescriptions   FUROSEMIDE (LASIX) 40 MG TABLET    Take 2 tablets (80 mg  total) by mouth daily as needed for fluid.    Plan discharge  Rolland Porter, MD, Barbette Or, MD 01/19/15 [phone]

## 2015-01-18 NOTE — ED Notes (Signed)
Pt c/o bilateral leg swelling.  

## 2015-01-19 MED ORDER — FUROSEMIDE 40 MG PO TABS: 80.0000 mg | ORAL_TABLET | Freq: Every day | ORAL | Status: DC | PRN

## 2015-01-19 NOTE — ED Notes (Signed)
Pt alert & oriented x4, stable gait. Patient given discharge instructions, paperwork & prescription(s). Patient  instructed to stop at the registration desk to finish any additional paperwork. Patient verbalized understanding. Pt left department w/ no further questions. 

## 2015-01-19 NOTE — Discharge Instructions (Signed)
For the next 2-3 days take the increased dose of the Lasix to get the fluid out of your legs then go back down to your regular dose. Follow-up if you get worse such as chest pain, struggling to breathe, or the swelling gets progressively worse. Look at the low-sodium diet and try to limit the salt in your diet which will help with the swelling.   Peripheral Edema You have swelling in your legs (peripheral edema). This swelling is due to excess accumulation of salt and water in your body. Edema may be a sign of heart, kidney or liver disease, or a side effect of a medication. It may also be due to problems in the leg veins. Elevating your legs and using special support stockings may be very helpful, if the cause of the swelling is due to poor venous circulation. Avoid long periods of standing, whatever the cause. Treatment of edema depends on identifying the cause. Chips, pretzels, pickles and other salty foods should be avoided. Restricting salt in your diet is almost always needed. Water pills (diuretics) are often used to remove the excess salt and water from your body via urine. These medicines prevent the kidney from reabsorbing sodium. This increases urine flow. Diuretic treatment may also result in lowering of potassium levels in your body. Potassium supplements may be needed if you have to use diuretics daily. Daily weights can help you keep track of your progress in clearing your edema. You should call your caregiver for follow up care as recommended. SEEK IMMEDIATE MEDICAL CARE IF:   You have increased swelling, pain, redness, or heat in your legs.  You develop shortness of breath, especially when lying down.  You develop chest or abdominal pain, weakness, or fainting.  You have a fever.   This information is not intended to replace advice given to you by your health care provider. Make sure you discuss any questions you have with your health care provider.   Document Released: 02/04/2004  Document Revised: 03/21/2011 Document Reviewed: 07/09/2014 Elsevier Interactive Patient Education [employer].

## 2015-01-20 DIAGNOSIS — Z6831 Body mass index (BMI) 31.0-31.9, adult: Secondary | ICD-10-CM | POA: Diagnosis not present

## 2015-01-20 DIAGNOSIS — I5022 Chronic systolic (congestive) heart failure: Secondary | ICD-10-CM | POA: Diagnosis not present

## 2015-01-22 ENCOUNTER — Encounter (HOSPITAL_COMMUNITY)
Admission: RE | Admit: 2015-01-22 | Discharge: 2015-01-22 | Disposition: A | Discharge status: Home / Self Care | Payer: PPO | Source: Ambulatory Visit | Attending: Urology | Admitting: Urology

## 2015-01-22 ENCOUNTER — Encounter (HOSPITAL_COMMUNITY): Payer: Self-pay

## 2015-01-22 DIAGNOSIS — N400 Enlarged prostate without lower urinary tract symptoms: Secondary | ICD-10-CM | POA: Insufficient documentation

## 2015-01-22 DIAGNOSIS — Z01818 Encounter for other preprocedural examination: Secondary | ICD-10-CM | POA: Insufficient documentation

## 2015-01-22 HISTORY — DX: Sleep disorder, unspecified: G47.9

## 2015-01-22 HISTORY — DX: Unspecified osteoarthritis, unspecified site: M19.90

## 2015-01-22 HISTORY — DX: Benign prostatic hyperplasia without lower urinary tract symptoms: N40.0

## 2015-01-22 HISTORY — DX: Irritable bowel syndrome, unspecified: K58.9

## 2015-01-22 NOTE — Patient Instructions (Addendum)
YOUR PROCEDURE IS SCHEDULED ON :  01/26/15  REPORT TO [facility] MAIN ENTRANCE FOLLOW SIGNS TO EAST ELEVATOR - GO TO 3rd FLOOR CHECK IN AT 3 EAST NURSES STATION (SHORT STAY) AT:  10:00 AM  CALL THIS NUMBER IF YOU HAVE PROBLEMS THE MORNING OF SURGERY [phone]  REMEMBER:ONLY 1 PER PERSON MAY GO TO SHORT STAY WITH YOU TO GET READY THE MORNING OF YOUR SURGERY  DO NOT EAT FOOD OR DRINK LIQUIDS AFTER MIDNIGHT  TAKE THESE MEDICINES THE MORNING OF SURGERY: DIVALPROEX / ARIPIRAZOLE / CARBAMAZEPINE  YOU MAY NOT HAVE ANY METAL ON YOUR BODY INCLUDING HAIR PINS AND PIERCING'S. DO NOT WEAR JEWELRY, MAKEUP, LOTIONS, POWDERS OR PERFUMES. DO NOT WEAR NAIL POLISH. DO NOT SHAVE 48 HRS PRIOR TO SURGERY. MEN MAY SHAVE FACE AND NECK.  DO NOT [place]. [facility] IS NOT RESPONSIBLE FOR VALUABLES.  CONTACTS, DENTURES OR PARTIALS MAY NOT BE WORN TO SURGERY. LEAVE SUITCASE IN CAR. CAN BE BROUGHT TO ROOM AFTER SURGERY.  PATIENTS DISCHARGED THE DAY OF SURGERY WILL NOT BE ALLOWED TO DRIVE HOME.  PLEASE READ OVER THE FOLLOWING INSTRUCTION SHEETS _________________________________________________________________________________                                          [place] - PREPARING FOR SURGERY  Before surgery, you can play an important role.  Because skin is not sterile, your skin needs to be as free of germs as possible.  You can reduce the number of germs on your skin by washing with CHG (chlorahexidine gluconate) soap before surgery.  CHG is an antiseptic cleaner which kills germs and bonds with the skin to continue killing germs even after washing. Please DO NOT use if you have an allergy to CHG or antibacterial soaps.  If your skin becomes reddened/irritated stop using the CHG and inform your nurse when you arrive at Short Stay. Do not shave (including legs and underarms) for at least 48 hours prior to the first CHG shower.  You may shave your face. Please  follow these instructions carefully:   1.  Shower with CHG Soap the night before surgery and the  morning of Surgery.   2.  If you choose to wash your hair, wash your hair first as usual with your  normal  Shampoo.   3.  After you shampoo, rinse your hair and body thoroughly to remove the  shampoo.                                         4.  Use CHG as you would any other liquid soap.  You can apply chg directly  to the skin and wash . Gently wash with scrungie or clean wascloth    5.  Apply the CHG Soap to your body ONLY FROM THE NECK DOWN.   Do not use on open                           Wound or open sores. Avoid contact with eyes, ears mouth and genitals (private parts).                        Genitals (private parts) with your  normal soap.              6.  Wash thoroughly, paying special attention to the area where your surgery  will be performed.   7.  Thoroughly rinse your body with warm water from the neck down.   8.  DO NOT shower/wash with your normal soap after using and rinsing off  the CHG Soap .                9.  Pat yourself dry with a clean towel.             10.  Wear clean night clothes to bed after shower             11.  Place clean sheets on your bed the night of your first shower and do not  sleep with pets.  Day of Surgery : Do not apply any lotions/deodorants the morning of surgery.  Please wear clean clothes to the hospital/surgery center.  FAILURE TO FOLLOW THESE INSTRUCTIONS MAY RESULT IN THE CANCELLATION OF YOUR SURGERY    PATIENT SIGNATURE_________________________________  ______________________________________________________________________

## 2015-01-22 NOTE — Progress Notes (Signed)
   01/22/15 1404  OBSTRUCTIVE SLEEP APNEA  Have you ever been diagnosed with sleep apnea through a sleep study? No  Do you snore loudly (loud enough to be heard through closed doors)?  1  Do you often feel tired, fatigued, or sleepy during the daytime (such as falling asleep during driving or talking to someone)? 0  Has anyone observed you stop breathing during your sleep? 0  Do you have, or are you being treated for high blood pressure? 1  BMI more than 35 kg/m2? 0  Age > 50 (1-yes) 1  Neck circumference greater than:Male 16 inches or larger, Male 17inches or larger? 1  Male Gender (Yes=1) 1  Obstructive Sleep Apnea Score 5  Score 5 or greater  Results sent to PCP

## 2015-01-23 DIAGNOSIS — I5022 Chronic systolic (congestive) heart failure: Secondary | ICD-10-CM | POA: Diagnosis not present

## 2015-01-23 DIAGNOSIS — Z6831 Body mass index (BMI) 31.0-31.9, adult: Secondary | ICD-10-CM | POA: Diagnosis not present

## 2015-01-26 ENCOUNTER — Encounter (HOSPITAL_COMMUNITY): Payer: Self-pay | Admitting: *Deleted

## 2015-01-26 ENCOUNTER — Ambulatory Visit (HOSPITAL_COMMUNITY): Payer: PPO | Admitting: Anesthesiology

## 2015-01-26 ENCOUNTER — Encounter (HOSPITAL_COMMUNITY)
Admission: RE | Disposition: A | Discharge status: Home / Self Care | Payer: Self-pay | Source: Ambulatory Visit | Attending: Urology

## 2015-01-26 ENCOUNTER — Observation Stay (HOSPITAL_COMMUNITY)
Admission: RE | Admit: 2015-01-26 | Discharge: 2015-01-27 | Disposition: A | Discharge status: Home / Self Care | Payer: PPO | Source: Ambulatory Visit | Attending: Urology | Admitting: Urology

## 2015-01-26 DIAGNOSIS — J45909 Unspecified asthma, uncomplicated: Secondary | ICD-10-CM | POA: Insufficient documentation

## 2015-01-26 DIAGNOSIS — Z79899 Other long term (current) drug therapy: Secondary | ICD-10-CM | POA: Diagnosis not present

## 2015-01-26 DIAGNOSIS — M1990 Unspecified osteoarthritis, unspecified site: Secondary | ICD-10-CM | POA: Diagnosis not present

## 2015-01-26 DIAGNOSIS — I2510 Atherosclerotic heart disease of native coronary artery without angina pectoris: Secondary | ICD-10-CM | POA: Diagnosis not present

## 2015-01-26 DIAGNOSIS — N401 Enlarged prostate with lower urinary tract symptoms: Secondary | ICD-10-CM | POA: Diagnosis not present

## 2015-01-26 DIAGNOSIS — I10 Essential (primary) hypertension: Secondary | ICD-10-CM | POA: Diagnosis not present

## 2015-01-26 DIAGNOSIS — Z7982 Long term (current) use of aspirin: Secondary | ICD-10-CM | POA: Diagnosis not present

## 2015-01-26 DIAGNOSIS — F319 Bipolar disorder, unspecified: Secondary | ICD-10-CM | POA: Diagnosis not present

## 2015-01-26 DIAGNOSIS — C679 Malignant neoplasm of bladder, unspecified: Secondary | ICD-10-CM | POA: Insufficient documentation

## 2015-01-26 DIAGNOSIS — R338 Other retention of urine: Secondary | ICD-10-CM | POA: Insufficient documentation

## 2015-01-26 DIAGNOSIS — Z87891 Personal history of nicotine dependence: Secondary | ICD-10-CM | POA: Insufficient documentation

## 2015-01-26 DIAGNOSIS — N400 Enlarged prostate without lower urinary tract symptoms: Secondary | ICD-10-CM | POA: Diagnosis present

## 2015-01-26 DIAGNOSIS — E785 Hyperlipidemia, unspecified: Secondary | ICD-10-CM | POA: Insufficient documentation

## 2015-01-26 DIAGNOSIS — I509 Heart failure, unspecified: Secondary | ICD-10-CM | POA: Insufficient documentation

## 2015-01-26 DIAGNOSIS — I4891 Unspecified atrial fibrillation: Secondary | ICD-10-CM | POA: Insufficient documentation

## 2015-01-26 DIAGNOSIS — N210 Calculus in bladder: Secondary | ICD-10-CM | POA: Diagnosis not present

## 2015-01-26 DIAGNOSIS — D494 Neoplasm of unspecified behavior of bladder: Secondary | ICD-10-CM | POA: Diagnosis not present

## 2015-01-26 DIAGNOSIS — I252 Old myocardial infarction: Secondary | ICD-10-CM | POA: Diagnosis not present

## 2015-01-26 DIAGNOSIS — I25810 Atherosclerosis of coronary artery bypass graft(s) without angina pectoris: Secondary | ICD-10-CM | POA: Diagnosis not present

## 2015-01-26 DIAGNOSIS — R3915 Urgency of urination: Secondary | ICD-10-CM | POA: Insufficient documentation

## 2015-01-26 DIAGNOSIS — Z951 Presence of aortocoronary bypass graft: Secondary | ICD-10-CM | POA: Insufficient documentation

## 2015-01-26 HISTORY — PX: TRANSURETHRAL RESECTION OF PROSTATE: SHX73

## 2015-01-26 HISTORY — PX: TRANSURETHRAL RESECTION OF BLADDER TUMOR: SHX2575

## 2015-01-26 LAB — CBC
HCT: 34.1 % — ABNORMAL LOW (ref 39.0–52.0)
HEMOGLOBIN: 10.7 g/dL — AB (ref 13.0–17.0)
MCH: 30.2 pg (ref 26.0–34.0)
MCHC: 31.4 g/dL (ref 30.0–36.0)
MCV: 96.3 fL (ref 78.0–100.0)
PLATELETS: 184 10*3/uL (ref 150–400)
RBC: 3.54 MIL/uL — AB (ref 4.22–5.81)
RDW: 13.5 % (ref 11.5–15.5)
WBC: 7.3 10*3/uL (ref 4.0–10.5)

## 2015-01-26 LAB — BASIC METABOLIC PANEL
ANION GAP: 7 (ref 5–15)
BUN: 27 mg/dL — ABNORMAL HIGH (ref 6–20)
CO2: 31 mmol/L (ref 22–32)
Calcium: 8.5 mg/dL — ABNORMAL LOW (ref 8.9–10.3)
Chloride: 101 mmol/L (ref 101–111)
Creatinine, Ser: 1.14 mg/dL (ref 0.61–1.24)
Glucose, Bld: 97 mg/dL (ref 65–99)
POTASSIUM: 4.9 mmol/L (ref 3.5–5.1)
SODIUM: 139 mmol/L (ref 135–145)

## 2015-01-26 SURGERY — TRANSURETHRAL RESECTION OF THE PROSTATE WITH GYRUS INSTRUMENTS
Anesthesia: General

## 2015-01-26 MED ORDER — LIDOCAINE HCL (CARDIAC) 20 MG/ML IV SOLN
INTRAVENOUS | Status: DC | PRN
  Administered 2015-01-26: 100 mg via INTRAVENOUS

## 2015-01-26 MED ORDER — PROPOFOL 10 MG/ML IV BOLUS
INTRAVENOUS | Status: DC | PRN
  Administered 2015-01-26: 30 mg via INTRAVENOUS
  Administered 2015-01-26: 140 mg via INTRAVENOUS

## 2015-01-26 MED ORDER — PROPOFOL 10 MG/ML IV BOLUS
INTRAVENOUS | Status: AC
  Filled 2015-01-26: qty 20

## 2015-01-26 MED ORDER — PHENYLEPHRINE 40 MCG/ML (10ML) SYRINGE FOR IV PUSH (FOR BLOOD PRESSURE SUPPORT)
PREFILLED_SYRINGE | INTRAVENOUS | Status: AC
  Filled 2015-01-26: qty 10

## 2015-01-26 MED ORDER — HYDROMORPHONE HCL 1 MG/ML IJ SOLN: 0.5000 mg | INTRAMUSCULAR | Status: DC | PRN

## 2015-01-26 MED ORDER — ONDANSETRON HCL 4 MG/2ML IJ SOLN
INTRAMUSCULAR | Status: AC
  Filled 2015-01-26: qty 2

## 2015-01-26 MED ORDER — ROCURONIUM BROMIDE 100 MG/10ML IV SOLN
INTRAVENOUS | Status: AC
  Filled 2015-01-26: qty 1

## 2015-01-26 MED ORDER — ACETAMINOPHEN 325 MG PO TABS: 650.0000 mg | ORAL_TABLET | ORAL | Status: DC | PRN

## 2015-01-26 MED ORDER — ROCURONIUM BROMIDE 100 MG/10ML IV SOLN
INTRAVENOUS | Status: DC | PRN
  Administered 2015-01-26: 40 mg via INTRAVENOUS

## 2015-01-26 MED ORDER — LIDOCAINE HCL (CARDIAC) 20 MG/ML IV SOLN
INTRAVENOUS | Status: AC
  Filled 2015-01-26: qty 5

## 2015-01-26 MED ORDER — DEXAMETHASONE SODIUM PHOSPHATE 10 MG/ML IJ SOLN
INTRAMUSCULAR | Status: AC
  Filled 2015-01-26: qty 1

## 2015-01-26 MED ORDER — PIPERACILLIN-TAZOBACTAM 3.375 G IVPB
INTRAVENOUS | Status: AC
  Filled 2015-01-26: qty 50

## 2015-01-26 MED ORDER — ARIPIPRAZOLE 10 MG PO TABS: 10.0000 mg | ORAL_TABLET | Freq: Every day | ORAL | Status: DC

## 2015-01-26 MED ORDER — FENTANYL CITRATE (PF) 250 MCG/5ML IJ SOLN
INTRAMUSCULAR | Status: AC
  Filled 2015-01-26: qty 5

## 2015-01-26 MED ORDER — FUROSEMIDE 20 MG PO TABS: 20.0000 mg | ORAL_TABLET | Freq: Every day | ORAL | Status: DC

## 2015-01-26 MED ORDER — ONDANSETRON HCL 4 MG/2ML IJ SOLN
INTRAMUSCULAR | Status: DC | PRN
  Administered 2015-01-26 (×2): 2 mg via INTRAVENOUS

## 2015-01-26 MED ORDER — ATORVASTATIN CALCIUM 80 MG PO TABS: 80.0000 mg | ORAL_TABLET | Freq: Every day | ORAL | Status: DC

## 2015-01-26 MED ORDER — DIPHENHYDRAMINE HCL 12.5 MG/5ML PO ELIX: 12.5000 mg | ORAL_SOLUTION | Freq: Four times a day (QID) | ORAL | Status: DC | PRN

## 2015-01-26 MED ORDER — DIVALPROEX SODIUM 500 MG PO DR TAB: 750.0000 mg | DELAYED_RELEASE_TABLET | Freq: Every day | ORAL | Status: DC

## 2015-01-26 MED ORDER — CETYLPYRIDINIUM CHLORIDE 0.05 % MT LIQD
7.0000 mL | Freq: Two times a day (BID) | OROMUCOSAL | Status: DC
  Administered 2015-01-26: 7 mL via OROMUCOSAL

## 2015-01-26 MED ORDER — BELLADONNA ALKALOIDS-OPIUM 16.2-60 MG RE SUPP: 1.0000 | Freq: Four times a day (QID) | RECTAL | Status: DC | PRN

## 2015-01-26 MED ORDER — TEMAZEPAM 15 MG PO CAPS: 15.0000 mg | ORAL_CAPSULE | Freq: Every evening | ORAL | Status: DC | PRN

## 2015-01-26 MED ORDER — ZOLPIDEM TARTRATE 5 MG PO TABS: 5.0000 mg | ORAL_TABLET | Freq: Every evening | ORAL | Status: DC | PRN

## 2015-01-26 MED ORDER — SODIUM CHLORIDE 0.9 % IV SOLN
INTRAVENOUS | Status: DC
  Administered 2015-01-26 – 2015-01-27 (×2): via INTRAVENOUS

## 2015-01-26 MED ORDER — PIPERACILLIN-TAZOBACTAM 3.375 G IVPB: 3.3750 g | Freq: Four times a day (QID) | INTRAVENOUS | Status: DC

## 2015-01-26 MED ORDER — PHENYLEPHRINE HCL 10 MG/ML IJ SOLN
INTRAMUSCULAR | Status: DC | PRN
  Administered 2015-01-26 (×9): 80 ug via INTRAVENOUS

## 2015-01-26 MED ORDER — SUGAMMADEX SODIUM 200 MG/2ML IV SOLN
INTRAVENOUS | Status: AC
  Filled 2015-01-26: qty 2

## 2015-01-26 MED ORDER — CARBAMAZEPINE 200 MG PO TABS: 400.0000 mg | ORAL_TABLET | Freq: Two times a day (BID) | ORAL | Status: DC

## 2015-01-26 MED ORDER — ASPIRIN EC 81 MG PO TBEC: 81.0000 mg | DELAYED_RELEASE_TABLET | Freq: Every day | ORAL | Status: DC

## 2015-01-26 MED ORDER — FENTANYL CITRATE (PF) 100 MCG/2ML IJ SOLN
INTRAMUSCULAR | Status: DC | PRN
  Administered 2015-01-26 (×2): 25 ug via INTRAVENOUS
  Administered 2015-01-26: 50 ug via INTRAVENOUS

## 2015-01-26 MED ORDER — HYDROMORPHONE HCL 1 MG/ML IJ SOLN
INTRAMUSCULAR | Status: AC
  Filled 2015-01-26: qty 1

## 2015-01-26 MED ORDER — DEXAMETHASONE SODIUM PHOSPHATE 10 MG/ML IJ SOLN
INTRAMUSCULAR | Status: DC | PRN
  Administered 2015-01-26: 5 mg via INTRAVENOUS

## 2015-01-26 MED ORDER — SUGAMMADEX SODIUM 200 MG/2ML IV SOLN
INTRAVENOUS | Status: DC | PRN
  Administered 2015-01-26: 200 mg via INTRAVENOUS

## 2015-01-26 MED ORDER — OXYCODONE-ACETAMINOPHEN 5-325 MG PO TABS: 1.0000 | ORAL_TABLET | ORAL | Status: DC | PRN

## 2015-01-26 MED ORDER — LACTATED RINGERS IV SOLN
INTRAVENOUS | Status: DC
  Administered 2015-01-26: 1000 mL via INTRAVENOUS
  Administered 2015-01-26: 13:00:00 via INTRAVENOUS

## 2015-01-26 MED ORDER — HYDROMORPHONE HCL 1 MG/ML IJ SOLN
0.2500 mg | INTRAMUSCULAR | Status: DC | PRN
  Administered 2015-01-26: 0.25 mg via INTRAVENOUS
  Administered 2015-01-26: 0.5 mg via INTRAVENOUS
  Administered 2015-01-26: 0.25 mg via INTRAVENOUS

## 2015-01-26 MED ORDER — ONDANSETRON HCL 4 MG/2ML IJ SOLN: 4.0000 mg | INTRAMUSCULAR | Status: DC | PRN

## 2015-01-26 MED ORDER — SODIUM CHLORIDE 0.9 % IR SOLN
Status: DC | PRN
  Administered 2015-01-26: 12000 mL

## 2015-01-26 MED ORDER — DIPHENHYDRAMINE HCL 50 MG/ML IJ SOLN: 12.5000 mg | Freq: Four times a day (QID) | INTRAMUSCULAR | Status: DC | PRN

## 2015-01-26 MED ORDER — PIPERACILLIN-TAZOBACTAM 3.375 G IVPB 30 MIN
3.3750 g | INTRAVENOUS | Status: AC
  Administered 2015-01-26: 3.375 g via INTRAVENOUS

## 2015-01-26 MED ORDER — EPHEDRINE SULFATE 50 MG/ML IJ SOLN
INTRAMUSCULAR | Status: DC | PRN
  Administered 2015-01-26: 10 mg via INTRAVENOUS

## 2015-01-26 SURGICAL SUPPLY — 17 items
BAG URINE DRAINAGE (UROLOGICAL SUPPLIES) ×3 IMPLANT
BAG URO CATCHER STRL LF (MISCELLANEOUS) ×3 IMPLANT
CATH FOLEY 3WAY 30CC 22FR (CATHETERS) ×3 IMPLANT
ELECT BIVAP BIPO 22/24 DONUT (ELECTROSURGICAL) ×3
ELECTRD BIVAP BIPO 22/24 DONUT (ELECTROSURGICAL) ×1 IMPLANT
GLOVE BIO SURGEON STRL SZ8 (GLOVE) ×3 IMPLANT
GOWN STRL REUS W/TWL LRG LVL3 (GOWN DISPOSABLE) ×6 IMPLANT
HOLDER FOLEY CATH W/STRAP (MISCELLANEOUS) IMPLANT
IV NS IRRIG 3000ML ARTHROMATIC (IV SOLUTION) ×6 IMPLANT
LOOP CUT BIPOLAR 24F LRG (ELECTROSURGICAL) ×3 IMPLANT
MANIFOLD NEPTUNE II (INSTRUMENTS) ×3 IMPLANT
PACK CYSTO (CUSTOM PROCEDURE TRAY) ×3 IMPLANT
PLUG CATH AND CAP STER (CATHETERS) IMPLANT
SYR 30ML LL (SYRINGE) ×3 IMPLANT
SYRINGE IRR TOOMEY STRL 70CC (SYRINGE) ×3 IMPLANT
TUBING CONNECTING 10 (TUBING) ×2 IMPLANT
TUBING CONNECTING 10' (TUBING) ×1

## 2015-01-26 NOTE — Anesthesia Procedure Notes (Addendum)
Procedure Name: LMA Insertion Date/Time: 01/26/2015 12:54 PM Performed by: Freddie Breech Pre-anesthesia Checklist: Patient identified, Emergency Drugs available, Suction available, Patient being monitored and Timeout performed Patient Re-evaluated:Patient Re-evaluated prior to inductionOxygen Delivery Method: Circle system utilized Preoxygenation: Pre-oxygenation with 100% oxygen Intubation Type: IV induction LMA: LMA inserted LMA Size: 5.0 Number of attempts: 1 Airway Equipment and Method: Patient positioned with wedge pillow Placement Confirmation: positive ETCO2,  CO2 detector and breath sounds checked- equal and bilateral Dental Injury: Teeth and Oropharynx as per pre-operative assessment    Procedure Name: Intubation Date/Time: 01/26/2015 1:12 PM Performed by: Freddie Breech Pre-anesthesia Checklist: Patient identified, Emergency Drugs available, Suction available, Patient being monitored and Timeout performed Patient Re-evaluated:Patient Re-evaluated prior to inductionOxygen Delivery Method: Circle system utilized Preoxygenation: Pre-oxygenation with 100% oxygen Intubation Type: IV induction Laryngoscope Size: Mac and 3 Grade View: Grade III Tube type: Oral Tube size: 7.0 mm Number of attempts: 2 (attempted once with Mac 4. ) Airway Equipment and Method: Patient positioned with wedge pillow and Stylet Placement Confirmation: ETT inserted through vocal cords under direct vision,  positive ETCO2,  CO2 detector and breath sounds checked- equal and bilateral Secured at: 22 cm Tube secured with: Tape Dental Injury: Teeth and Oropharynx as per pre-operative assessment

## 2015-01-26 NOTE — Anesthesia Postprocedure Evaluation (Signed)
Anesthesia Post Note  Patient: Grant Stevens  Procedure(s) Performed: Procedure(s) (LRB): TRANSURETHRAL RESECTION OF THE PROSTATE WITH GYRUS INSTRUMENTS (N/A) TRANSURETHRAL RESECTION OF BLADDER TUMOR (TURBT) (N/A)  Patient location during evaluation: PACU Anesthesia Type: General Level of consciousness: awake and alert Pain management: pain level controlled Vital Signs Assessment: post-procedure vital signs reviewed and stable Respiratory status: spontaneous breathing, nonlabored ventilation, respiratory function stable and patient connected to nasal cannula oxygen Cardiovascular status: blood pressure returned to baseline and stable Postop Assessment: no signs of nausea or vomiting Anesthetic complications: no    Last Vitals:  Filed Vitals:   01/26/15 1415 01/26/15 1430  BP: 128/74 139/79  Pulse: 84 83  Temp:    Resp: 14 14    Last Pain:  Filed Vitals:   01/26/15 1432  PainSc: 4                  Kylei Purington,W. EDMOND

## 2015-01-26 NOTE — Transfer of Care (Signed)
Immediate Anesthesia Transfer of Care Note  Patient: CADELL HEYSER  Procedure(s) Performed: Procedure(s): TRANSURETHRAL RESECTION OF THE PROSTATE WITH GYRUS INSTRUMENTS (N/A) TRANSURETHRAL RESECTION OF BLADDER TUMOR (TURBT) (N/A)  Patient Location: PACU  Anesthesia Type:General  Level of Consciousness:  sedated, patient cooperative and responds to stimulation  Airway & Oxygen Therapy:Patient Spontanous Breathing and Patient connected to face mask oxgen  Post-op Assessment:  Report given to PACU RN and Post -op Vital signs reviewed and stable  Post vital signs:  Reviewed and stable  Last Vitals:  Filed Vitals:   01/26/15 0945  BP: 136/78  Pulse: 82  Temp: 36.7 C  Resp: 16    Complications: No apparent anesthesia complications

## 2015-01-26 NOTE — Brief Op Note (Signed)
01/26/2015  1:36 PM  PATIENT:  Grant Stevens  75 y.o. male  PRE-OPERATIVE DIAGNOSIS:  BENIGN PROSTATIC HYPERPLASIA  POST-OPERATIVE DIAGNOSIS:  BENIGN PROSTATIC HYPERPLASIA  PROCEDURE:  Procedure(s): TRANSURETHRAL RESECTION OF THE PROSTATE WITH GYRUS INSTRUMENTS (N/A) TRANSURETHRAL RESECTION OF BLADDER TUMOR (TURBT) (N/A)  SURGEON:  Surgeon(s) and Role:    * Cleon Gustin, MD - Primary  PHYSICIAN ASSISTANT:   ASSISTANTS: none   ANESTHESIA:   general  EBL:  Total I/O In: 1000 [I.V.:1000] Out: -   BLOOD ADMINISTERED:none  DRAINS: Urinary Catheter (Foley)   LOCAL MEDICATIONS USED:  NONE  SPECIMEN:  Source of Specimen:  Prostate chips and left lateral wall bladder tumor  DISPOSITION OF SPECIMEN:  PATHOLOGY  COUNTS:  YES  TOURNIQUET:  * No tourniquets in log *  DICTATION: .Note written in EPIC  PLAN OF CARE: Admit for overnight observation  PATIENT DISPOSITION:  PACU - hemodynamically stable.   Delay start of Pharmacological VTE agent (>24hrs) due to surgical blood loss or risk of bleeding: not applicable

## 2015-01-26 NOTE — H&P (Signed)
Urology Admission H&P  Chief Complaint: urinary urgency  History of Present Illness: [name] is a 75yo with a hx of BPH and urinary retention here for TURP. He underwent UDS which showed poor detrusor contraction. He has a chronic foley and wishes to have it removed. He has occasional urinary urgency.   Past Medical History  Diagnosis Date  . Coronary atherosclerosis of native coronary artery     Multivessel s/p CABG in 06/2001 post-IMI, LVEF 35% up to 50% postoperatively;  . Essential hypertension, benign   . Hyperlipidemia   . History of bipolar disorder   . Allergic rhinitis   . Erectile dysfunction   . Atrial fibrillation ([place])     Remote history - WARCEF  . Bipolar 1 disorder ([place])   . Depression   . Myocardial infarction ([place]) 2002  . CHF (congestive heart failure) ([place]) ?2002  . Asthma     AS CHILD - NO RECENT PROBLEMS  . Arthritis   . IBS (irritable bowel syndrome)   . BPH (benign prostatic hyperplasia)   . Dysuria   . Foley catheter in place   . Difficulty sleeping    Past Surgical History  Procedure Laterality Date  . Tonsillectomy    . Circumcision  10/2003  . Colonoscopy N/A 07/11/2012    Procedure: COLONOSCOPY;  Surgeon: Rogene Houston, MD;  Location: [facility] ENDO SUITE;  Service: Endoscopy;  Laterality: N/A;  830-moved to 930 Ann to notify pt  . Cataract extraction w/phaco Left 12/29/2014    Procedure: CATARACT EXTRACTION PHACO AND INTRAOCULAR LENS PLACEMENT (IOC);  Surgeon: Williams Che, MD;  Location: [facility] ORS;  Service: Ophthalmology;  Laterality: Left;  CDE:3.71  . Coronary artery bypass graft  08/2000    Dr. Servando Snare - LIMA to LAD, SVG to diagonal, SVG to PDA    Home Medications:  Prescriptions prior to admission  Medication Sig Dispense Refill Last Dose  . ARIPiprazole (ABILIFY) 5 MG tablet Take 10 mg by mouth daily.   01/26/2015 at 0830  . aspirin EC 81 MG tablet Take 81 mg by mouth daily.   01/21/2015  . atorvastatin (LIPITOR) 80 MG tablet Take 80 mg  by mouth at bedtime.     01/25/2015 at Unknown time  . carbamazepine (TEGRETOL) 200 MG tablet Take 400 mg by mouth 2 (two) times daily.    01/26/2015 at 0830  . divalproex (DEPAKOTE) 250 MG DR tablet Take 750 mg by mouth daily.    01/26/2015 at 0830  . furosemide (LASIX) 20 MG tablet Take 20 mg by mouth daily.   01/18/2015 at Unknown time  . furosemide (LASIX) 40 MG tablet Take 2 tablets (80 mg total) by mouth daily as needed for fluid. 10 tablet 0 01/25/2015 at Unknown time  . lisinopril (PRINIVIL,ZESTRIL) 5 MG tablet TAKE ONE TABLET BY MOUTH ONCE DAILY. 90 tablet 1 01/25/2015 at Unknown time  . temazepam (RESTORIL) 15 MG capsule Take 1 capsule (15 mg total) by mouth at bedtime as needed for sleep. 15 capsule 0 01/23/2015   Allergies: No Known Allergies  Family History  Problem Relation Age of Onset  . Asthma Mother   . Heart attack Father 102  . Diabetes Brother    Social History:  reports that he quit smoking about 44 years ago. His smoking use included Cigarettes. He has a 15 pack-year smoking history. He does not have any smokeless tobacco history on file. He reports that he does not drink alcohol or use illicit drugs.  Review  of Systems  Genitourinary: Positive for urgency and frequency.  All other systems reviewed and are negative.   Physical Exam:  Vital signs in last 24 hours: Temp:  [98 F (36.7 C)] 98 F (36.7 C) (01/16 0945) Pulse Rate:  [82] 82 (01/16 0945) Resp:  [16] 16 (01/16 0945) BP: (136)/(78) 136/78 mmHg (01/16 0945) SpO2:  [100 %] 100 % (01/16 0945) Weight:  [104.781 kg (231 lb)] 104.781 kg (231 lb) (01/16 1017) Physical Exam  Constitutional: He is oriented to person, place, and time. He appears well-developed and well-nourished.  HENT:  Head: Normocephalic and atraumatic.  Eyes: EOM are normal. Pupils are equal, round, and reactive to light.  Neck: Normal range of motion. No thyromegaly present.  Cardiovascular: Normal rate and regular rhythm.   Respiratory:  Effort normal. No respiratory distress.  GI: Soft. He exhibits no distension and no mass. There is no tenderness. There is no rebound and no guarding.  Musculoskeletal: Normal range of motion.  Neurological: He is alert and oriented to person, place, and time.  Skin: Skin is warm and dry.  Psychiatric: He has a normal mood and affect. His behavior is normal. Judgment and thought content normal.    Laboratory Data:  No results found for this or any previous visit (from the past 24 hour(s)). No results found for this or any previous visit (from the past 240 hour(s)). Creatinine: No results for input(s): CREATININE in the last 168 hours. Baseline Creatinine: unknown  Impression/Assessment:  74yo with BPH with LUTS, urinary retention, chronic foley  Plan:  The risks/benefits/alternatives to TURP was explained to the patient and he understands and wishes to proceed with surgery  Ichael Stevens L 01/26/2015, 12:03 PM

## 2015-01-26 NOTE — Anesthesia Preprocedure Evaluation (Addendum)
Anesthesia Evaluation  Patient identified by MRN, date of birth, ID band Patient awake    Reviewed: Allergy & Precautions, H&P , NPO status , Patient's Chart, lab work & pertinent test results  Airway Mallampati: II  TM Distance: >3 FB Neck ROM: Full    Dental no notable dental hx. (+) Teeth Intact, Dental Advisory Given   Pulmonary asthma , former smoker,    Pulmonary exam normal breath sounds clear to auscultation       Cardiovascular hypertension, Pt. on medications + CAD, + Past MI, + CABG and +CHF   Rhythm:Regular Rate:Normal     Neuro/Psych Depression Bipolar Disorder negative neurological ROS     GI/Hepatic negative GI ROS, Neg liver ROS,   Endo/Other  negative endocrine ROS  Renal/GU negative Renal ROS  negative genitourinary   Musculoskeletal  (+) Arthritis , Osteoarthritis,    Abdominal   Peds  Hematology negative hematology ROS (+)   Anesthesia Other Findings   Reproductive/Obstetrics negative OB ROS                            Anesthesia Physical Anesthesia Plan  ASA: III  Anesthesia Plan: General   Post-op Pain Management:    Induction: Intravenous  Airway Management Planned: LMA  Additional Equipment:   Intra-op Plan:   Post-operative Plan: Extubation in OR  Informed Consent: I have reviewed the patients History and Physical, chart, labs and discussed the procedure including the risks, benefits and alternatives for the proposed anesthesia with the patient or authorized representative who has indicated his/her understanding and acceptance.   Dental advisory given  Plan Discussed with: CRNA  Anesthesia Plan Comments:         Anesthesia Quick Evaluation

## 2015-01-27 DIAGNOSIS — N401 Enlarged prostate with lower urinary tract symptoms: Secondary | ICD-10-CM | POA: Diagnosis not present

## 2015-01-27 LAB — BASIC METABOLIC PANEL
Anion gap: 8 (ref 5–15)
BUN: 27 mg/dL — ABNORMAL HIGH (ref 6–20)
CO2: 28 mmol/L (ref 22–32)
Calcium: 8.8 mg/dL — ABNORMAL LOW (ref 8.9–10.3)
Chloride: 104 mmol/L (ref 101–111)
Creatinine, Ser: 1.37 mg/dL — ABNORMAL HIGH (ref 0.61–1.24)
GFR calc Af Amer: 57 mL/min — ABNORMAL LOW (ref >60)
GFR calc non Af Amer: 49 mL/min — ABNORMAL LOW (ref >60)
Glucose, Bld: 103 mg/dL — ABNORMAL HIGH (ref 65–99)
Potassium: 4.5 mmol/L (ref 3.5–5.1)
Sodium: 140 mmol/L (ref 135–145)

## 2015-01-27 LAB — CBC
HCT: 33.9 % — ABNORMAL LOW (ref 39.0–52.0)
Hemoglobin: 10.7 g/dL — ABNORMAL LOW (ref 13.0–17.0)
MCH: 30.1 pg (ref 26.0–34.0)
MCHC: 31.6 g/dL (ref 30.0–36.0)
MCV: 95.5 fL (ref 78.0–100.0)
Platelets: 178 10*3/uL (ref 150–400)
RBC: 3.55 MIL/uL — ABNORMAL LOW (ref 4.22–5.81)
RDW: 13.4 % (ref 11.5–15.5)
WBC: 10.4 10*3/uL (ref 4.0–10.5)

## 2015-01-27 MED ORDER — OXYCODONE-ACETAMINOPHEN 5-325 MG PO TABS: 1.0000 | ORAL_TABLET | ORAL | Status: DC | PRN

## 2015-01-27 NOTE — Discharge Summary (Signed)
Physician Discharge Summary  Patient ID: Grant Stevens MRN: [id number] DOB/AGE: [birth year] 75 y.o.  Admit date: 01/26/2015 Discharge date: 01/27/2015  Admission Diagnoses: BPH  Discharge Diagnoses:  Active Problems:   BPH (benign prostatic hyperplasia) Bladder tumor Bladder calculi  Discharged Condition: good  Hospital Course: The patient tolerated the procedure well and was transferred to the floor on IV pain meds, IV fluid. On POD#1 pt was started on regular diet and they ambulated in the halls. His urine was clear off CBI on POD#1. Prior to discharge the pt was tolerating a regular diet, pain was controlled on PO pain meds, they were ambulating without difficulty, and they had normal bowel function.   Consults: None  Significant Diagnostic Studies: none  Treatments: surgery: TURP, TURBT, cystolithalopaxy  Discharge Exam: Blood pressure 136/63, pulse 79, temperature 98.4 F (36.9 C), temperature source Oral, resp. rate 18, height 6' (1.829 m), weight 103.3 kg (227 lb 11.8 oz), SpO2 99 %. General appearance: alert, cooperative and appears stated age Head: Normocephalic, without obvious abnormality, atraumatic Eyes: conjunctivae/corneas clear. PERRL, EOM's intact. Fundi benign. Resp: clear to auscultation bilaterally Cardio: regular rate and rhythm, S1, S2 normal, no murmur, click, rub or gallop GI: soft, non-tender; bowel sounds normal; no masses,  no organomegaly Extremities: extremities normal, atraumatic, no cyanosis or edema Neurologic: Grossly normal  Disposition: 01-Home or Self Care     Medication List    TAKE these medications        ARIPiprazole 5 MG tablet  Commonly known as:  ABILIFY  Take 10 mg by mouth daily.     aspirin EC 81 MG tablet  Take 81 mg by mouth daily.     atorvastatin 80 MG tablet  Commonly known as:  LIPITOR  Take 80 mg by mouth at bedtime.     carbamazepine 200 MG tablet  Commonly known as:  TEGRETOL  Take 400 mg by mouth 2 (two)  times daily.     divalproex 250 MG DR tablet  Commonly known as:  DEPAKOTE  Take 750 mg by mouth daily.     furosemide 20 MG tablet  Commonly known as:  LASIX  Take 20 mg by mouth daily.     furosemide 40 MG tablet  Commonly known as:  LASIX  Take 2 tablets (80 mg total) by mouth daily as needed for fluid.     lisinopril 5 MG tablet  Commonly known as:  PRINIVIL,ZESTRIL  TAKE ONE TABLET BY MOUTH ONCE DAILY.     oxyCODONE-acetaminophen 5-325 MG tablet  Commonly known as:  PERCOCET/ROXICET  Take 1-2 tablets by mouth every 4 (four) hours as needed for moderate pain.     temazepam 15 MG capsule  Commonly known as:  RESTORIL  Take 1 capsule (15 mg total) by mouth at bedtime as needed for sleep.         Signed: MCKENZIE, PATRICK L 01/27/2015, 1:28 PM

## 2015-01-27 NOTE — Op Note (Signed)
Preoperative diagnosis: BPH  Postoperative diagnosis: BPH  Procedure: 1 cystoscopy 2. Transurethral resection of the prostate 3. Transurethral resection of bladder tumor, medium 4. Cystolithalopaxy  Attending: Nicolette Bang  Anesthesia: General  Estimated blood loss: Minimal  Drains: 22 French foley  Specimens: 1. Prostate Chips 2. Bladder tumor 3. Bladder calculi  Antibiotics: Zosyn  Findings: Trilobar prostate enlargement. Ureteral orifices in normal anatomic location. Multiple 1cm bladder calculi. 3cm left lateral wall papillary bladder tumor  Indications: Patient is a 75 year old male with a history of BPH and urinary retention.  After discussing treatment options, they decided proceed with transurethral resection of the prostate.  Procedure her in detail: The patient was brought to the operating room and a brief timeout was done to ensure correct patient, correct procedure, correct site.  General anesthesia was administered patient was placed in dorsal lithotomy position.  Their genitalia was then prepped and draped in usual sterile fashion.  A rigid 2 French cystoscope was passed in the urethra and the bladder.  Bladder was inspected and we a 3cm lesion on the left lateral wall and multiple 1 cm bladder calculi. The bladder calculi were fragmented then removed from the bladder.  the ureteral orifices were in the normal orthotopic locations. removed the cystoscope and placed a resectoscope into the bladder. We then turned our attention to the prostate resection. Using the bipolar resectoscope we resected the median lobe first from the bladder neck to the verumontanum. We then started at the 12 oclock position on the left lobe and resection to the 6 o'clock position from the bladder neck to the verumontanum. We then did the same resection of the right lobe. Once the resection was complete we then cauterized individual bleeders. We then removed the prostate chips and sent them for  pathology.  We then re-inspected the prostatic fossa and found no residual bleeding. We then turned our attention to the bladder tumor. Using bipolar electrocautery the bladder tumor was resected to the base. Individual bleeders were then cauterized. The tumor chips were then sent for pathology. the bladder was then drained, a 22 French foley was placed and this concluded the procedure which was well tolerated by patient.  Complications: None  Condition: Stable, extubated, transferred to PACU  Plan: Patient is admitted overnight with continuous bladder irrigation. If their urine is clear tomorrow they will be discharged home and followup in 5 days for foley catheter removal and pathology discussion.

## 2015-01-28 ENCOUNTER — Emergency Department (HOSPITAL_COMMUNITY)
Admission: EM | Admit: 2015-01-28 | Discharge: 2015-01-28 | Disposition: A | Discharge status: Home / Self Care | Payer: PPO | Attending: Emergency Medicine | Admitting: Emergency Medicine

## 2015-01-28 ENCOUNTER — Encounter (HOSPITAL_COMMUNITY): Payer: Self-pay | Admitting: Emergency Medicine

## 2015-01-28 DIAGNOSIS — T839XXA Unspecified complication of genitourinary prosthetic device, implant and graft, initial encounter: Secondary | ICD-10-CM

## 2015-01-28 DIAGNOSIS — J45909 Unspecified asthma, uncomplicated: Secondary | ICD-10-CM | POA: Insufficient documentation

## 2015-01-28 DIAGNOSIS — Z79899 Other long term (current) drug therapy: Secondary | ICD-10-CM | POA: Insufficient documentation

## 2015-01-28 DIAGNOSIS — E785 Hyperlipidemia, unspecified: Secondary | ICD-10-CM | POA: Diagnosis not present

## 2015-01-28 DIAGNOSIS — Z9889 Other specified postprocedural states: Secondary | ICD-10-CM | POA: Insufficient documentation

## 2015-01-28 DIAGNOSIS — I10 Essential (primary) hypertension: Secondary | ICD-10-CM | POA: Diagnosis not present

## 2015-01-28 DIAGNOSIS — F319 Bipolar disorder, unspecified: Secondary | ICD-10-CM | POA: Insufficient documentation

## 2015-01-28 DIAGNOSIS — N400 Enlarged prostate without lower urinary tract symptoms: Secondary | ICD-10-CM | POA: Diagnosis not present

## 2015-01-28 DIAGNOSIS — I509 Heart failure, unspecified: Secondary | ICD-10-CM | POA: Insufficient documentation

## 2015-01-28 DIAGNOSIS — I2510 Atherosclerotic heart disease of native coronary artery without angina pectoris: Secondary | ICD-10-CM | POA: Insufficient documentation

## 2015-01-28 DIAGNOSIS — Z7982 Long term (current) use of aspirin: Secondary | ICD-10-CM | POA: Diagnosis not present

## 2015-01-28 DIAGNOSIS — Z87891 Personal history of nicotine dependence: Secondary | ICD-10-CM | POA: Diagnosis not present

## 2015-01-28 DIAGNOSIS — I4891 Unspecified atrial fibrillation: Secondary | ICD-10-CM | POA: Diagnosis not present

## 2015-01-28 DIAGNOSIS — T83192A Other mechanical complication of urinary stent, initial encounter: Secondary | ICD-10-CM | POA: Diagnosis not present

## 2015-01-28 DIAGNOSIS — I252 Old myocardial infarction: Secondary | ICD-10-CM | POA: Insufficient documentation

## 2015-01-28 DIAGNOSIS — T83098A Other mechanical complication of other indwelling urethral catheter, initial encounter: Secondary | ICD-10-CM | POA: Insufficient documentation

## 2015-01-28 DIAGNOSIS — Y658 Other specified misadventures during surgical and medical care: Secondary | ICD-10-CM | POA: Diagnosis not present

## 2015-01-28 DIAGNOSIS — R319 Hematuria, unspecified: Secondary | ICD-10-CM | POA: Diagnosis not present

## 2015-01-28 LAB — URINE MICROSCOPIC-ADD ON

## 2015-01-28 LAB — URINALYSIS, ROUTINE W REFLEX MICROSCOPIC
Glucose, UA: NEGATIVE mg/dL
Nitrite: NEGATIVE
PROTEIN: 100 mg/dL — AB
Specific Gravity, Urine: 1.025 (ref 1.005–1.030)
pH: 5 (ref 5.0–8.0)

## 2015-01-28 MED ORDER — SULFAMETHOXAZOLE-TRIMETHOPRIM 800-160 MG PO TABS
1.0000 | ORAL_TABLET | Freq: Once | ORAL | Status: AC
  Administered 2015-01-28: 1 via ORAL
  Filled 2015-01-28: qty 1

## 2015-01-28 MED ORDER — SULFAMETHOXAZOLE-TRIMETHOPRIM 800-160 MG PO TABS: 1.0000 | ORAL_TABLET | Freq: Two times a day (BID) | ORAL | Status: AC

## 2015-01-28 NOTE — ED Notes (Signed)
Instructed patient on how to change out leg bag to the night time bag and got him to return the demonstration to make sure he understood. Gave patient an 8oz ginger ale as per the doctors request. Will get a clean urine sample when one is available.

## 2015-01-28 NOTE — Discharge Instructions (Signed)
Hematuria, Adult °Hematuria is blood in your urine. It can be caused by a bladder infection, kidney infection, prostate infection, kidney stone, or cancer of your urinary tract. Infections can usually be treated with medicine, and a kidney stone usually will pass through your urine. If neither of these is the cause of your hematuria, further workup to find out the reason may be needed. °It is very important that you tell your health care provider about any blood you see in your urine, even if the blood stops without treatment or happens without causing pain. Blood in your urine that happens and then stops and then happens again can be a symptom of a very serious condition. Also, pain is not a symptom in the initial stages of many urinary cancers. °HOME CARE INSTRUCTIONS  °· Drink lots of fluid, 3-4 quarts a day. If you have been diagnosed with an infection, cranberry juice is especially recommended, in addition to large amounts of water. °· Avoid caffeine, tea, and carbonated beverages because they tend to irritate the bladder. °· Avoid alcohol because it may irritate the prostate. °· Take all medicines as directed by your health care provider. °· If you were prescribed an antibiotic medicine, finish it all even if you start to feel better. °· If you have been diagnosed with a kidney stone, follow your health care provider's instructions regarding straining your urine to catch the stone. °· Empty your bladder often. Avoid holding urine for long periods of time. °· After a bowel movement, women should cleanse front to back. Use each tissue only once. °· Empty your bladder before and after sexual intercourse if you are a male. °SEEK MEDICAL CARE IF: °1. You develop back pain. °2. You have a fever. °3. You have a feeling of sickness in your stomach (nausea) or vomiting. °4. Your symptoms are not better in 3 days. Return sooner if you are getting worse. °SEEK IMMEDIATE MEDICAL CARE IF:  °1. You develop severe vomiting  and are unable to keep the medicine down. °2. You develop severe back or abdominal pain despite taking your medicines. °3. You begin passing a large amount of blood or clots in your urine. °4. You feel extremely weak or faint, or you pass out. °MAKE SURE YOU:  °1. Understand these instructions. °2. Will watch your condition. °3. Will get help right away if you are not doing well or get worse. °  °This information is not intended to replace advice given to you by your health care provider. Make sure you discuss any questions you have with your health care provider. °  °Document Released: 12/27/2004 Document Revised: 01/17/2014 Document Reviewed: 08/27/2012 °Elsevier Interactive Patient Education ©2016 [employer] °Foley Catheter Care, Adult °A Foley catheter is a soft, flexible tube that is placed into the bladder to drain urine. A Foley catheter may be inserted if: °· You leak urine or are not able to control when you urinate (urinary incontinence). °· You are not able to urinate when you need to (urinary retention). °· You had prostate surgery or surgery on the genitals. °· You have certain medical conditions, such as multiple sclerosis, dementia, or a spinal cord injury. °If you are going home with a Foley catheter in place, follow the instructions below. °TAKING CARE OF THE CATHETER °5. Wash your hands with soap and water. °6. Using mild soap and warm water on a clean washcloth: °¨ Clean the area on your body closest to the catheter insertion site using a circular motion, moving   away from the catheter. Never wipe toward the catheter because this could sweep bacteria up into the urethra and cause infection. °¨ Remove all traces of soap. Pat the area dry with a clean towel. For males, reposition the foreskin. °7. Attach the catheter to your leg so there is no tension on the catheter. Use adhesive tape or a leg strap. If you are using adhesive tape, remove any sticky residue left behind by the previous tape you  used. °8. Keep the drainage bag below the level of the bladder, but keep it off the floor. °9. Check throughout the day to be sure the catheter is working and urine is draining freely. Make sure the tubing does not become kinked. °10. Do not pull on the catheter or try to remove it. Pulling could damage internal tissues. °TAKING CARE OF THE DRAINAGE BAGS °You will be given two drainage bags to take home. One is a large overnight drainage bag, and the other is a smaller leg bag that fits underneath clothing. You may wear the overnight bag at any time, but you should never wear the smaller leg bag at night. Follow the instructions below for how to empty, change, and clean your drainage bags. °Emptying the Drainage Bag °You must empty your drainage bag when it is  -½ full or at least 2-3 times a day. °5. Wash your hands with soap and water. °6. Keep the drainage bag below your hips, below the level of your bladder. This stops urine from going back into the tubing and into your bladder. °7. Hold the dirty bag over the toilet or a clean container. °8. Open the pour spout at the bottom of the bag and empty the urine into the toilet or container. Do not let the pour spout touch the toilet, container, or any other surface. Doing so can place bacteria on the bag, which can cause an infection. °9. Clean the pour spout with a gauze pad or cotton ball that has rubbing alcohol on it. °10. Close the pour spout. °11. Attach the bag to your leg with adhesive tape or a leg strap. °12. Wash your hands well. °Changing the Drainage Bag °Change your drainage bag once a month or sooner if it starts to smell bad or look dirty. Below are steps to follow when changing the drainage bag. °4. Wash your hands with soap and water. °5. Pinch off the rubber catheter so that urine does not spill out. °6. Disconnect the catheter tube from the drainage tube at the connection valve. Do not let the tubes touch any surface. °7. Clean the end of the  catheter tube with an alcohol wipe. Use a different alcohol wipe to clean the end of the drainage tube. °8. Connect the catheter tube to the drainage tube of the clean drainage bag. °9. Attach the new bag to the leg with adhesive tape or a leg strap. Avoid attaching the new bag too tightly. °10. Wash your hands well. °Cleaning the Drainage Bag °1. Wash your hands with soap and water. °2. Wash the bag in warm, soapy water. °3. Rinse the bag thoroughly with warm water. °4. Fill the bag with a solution of white vinegar and water (1 cup vinegar to 1 qt warm water [.2 L vinegar to 1 L warm water]). Close the bag and soak it for 30 minutes in the solution. °5. Rinse the bag with warm water. °6. Hang the bag to dry with the pour spout open and hanging downward. °7. Store   the clean bag (once it is dry) in a clean plastic bag. °8. Wash your hands well. °PREVENTING INFECTION °· Wash your hands before and after handling your catheter. °· Take showers daily and wash the area where the catheter enters your body. Do not take baths. Replace wet leg straps with dry ones, if this applies. °· Do not use powders, sprays, or lotions on the genital area. Only use creams, lotions, or ointments as directed by your caregiver. °· For females, wipe from front to back after each bowel movement. °· Drink enough fluids to keep your urine clear or pale yellow unless you have a fluid restriction. °· Do not let the drainage bag or tubing touch or lie on the floor. °· Wear cotton underwear to absorb moisture and to keep your skin drier. °SEEK MEDICAL CARE IF:  °· Your urine is cloudy or smells unusually bad. °· Your catheter becomes clogged. °· You are not draining urine into the bag or your bladder feels full. °· Your catheter starts to leak. °SEEK IMMEDIATE MEDICAL CARE IF:  °· You have pain, swelling, redness, or pus where the catheter enters the body. °· You have pain in the abdomen, legs, lower back, or bladder. °· You have a fever. °· You see  blood fill the catheter, or your urine is pink or red. °· You have nausea, vomiting, or chills. °· Your catheter gets pulled out. °MAKE SURE YOU:  °· Understand these instructions. °· Will watch your condition. °· Will get help right away if you are not doing well or get worse. °  °This information is not intended to replace advice given to you by your health care provider. Make sure you discuss any questions you have with your health care provider. °  °Document Released: 12/27/2004 Document Revised: 05/13/2013 Document Reviewed: 12/19/2011 °Elsevier Interactive Patient Education ©2016 [employer] ° °

## 2015-01-28 NOTE — ED Provider Notes (Signed)
By signing my name below, I, Eustaquio Maize, attest that this documentation has been prepared under the direction and in the presence of [employer], DO. Electronically Signed: Eustaquio Maize, ED Scribe. 01/28/2015. 12:46 AM.  TIME SEEN: 12:39 AM  CHIEF COMPLAINT: help with changing catheter bad  HPI:  Grant Stevens is a 75 y.o. male with CAD, a fib no longer on anticoagulation, CHF, BPH who presents to the Emergency Department complaining of gradual onset, constant, penile pain and bleeding from the site s/p prostate surgery that occurred 2 days ago. Pt is s/p TURP, resection of bladder wall tumor, and cystolitholapaxy on 01/26/15 by Dr. Alyson Ingles.  Pt states he has been having difficulty draining his foley catheter leg bag and spilled bloody urine from the bag on his clothes. He noticed a small blood clot in the bag as well. Pt reports that his urine is slightly darker than at discharge yesterday morning.  Denies abdominal pain, nausea, vomiting, or any other associated symptoms. Denies fever. States he is mostly here because he needs help with learning how to drain urine from his leg bag. His knee leaking around the catheter. States that this pain that he is having is normal postoperative pain. States he feels like his bladder is empty normally.  ROS: See HPI Constitutional: no fever  Eyes: no drainage  ENT: no runny nose   Cardiovascular:  no chest pain  Resp: no SOB  GI: no vomiting GU: penile pain and bleeding. no dysuria Integumentary: no rash  Allergy: no hives  Musculoskeletal: no leg swelling  Neurological: no slurred speech ROS otherwise negative  PAST MEDICAL HISTORY/PAST SURGICAL HISTORY:  Past Medical History  Diagnosis Date  . Coronary atherosclerosis of native coronary artery     Multivessel s/p CABG in 06/2001 post-IMI, LVEF 35% up to 50% postoperatively;  . Essential hypertension, benign   . Hyperlipidemia   . History of bipolar disorder   . Allergic rhinitis   .  Erectile dysfunction   . Atrial fibrillation ([place])     Remote history - WARCEF  . Bipolar 1 disorder ([place])   . Depression   . Myocardial infarction ([place]) 2002  . CHF (congestive heart failure) ([place]) ?2002  . Asthma     AS CHILD - NO RECENT PROBLEMS  . Arthritis   . IBS (irritable bowel syndrome)   . BPH (benign prostatic hyperplasia)   . Dysuria   . Foley catheter in place   . Difficulty sleeping     MEDICATIONS:  Prior to Admission medications   Medication Sig Start Date End Date Taking? Authorizing Provider  ARIPiprazole (ABILIFY) 5 MG tablet Take 10 mg by mouth daily.    Historical Provider, MD  aspirin EC 81 MG tablet Take 81 mg by mouth daily.    Historical Provider, MD  atorvastatin (LIPITOR) 80 MG tablet Take 80 mg by mouth at bedtime.      Historical Provider, MD  carbamazepine (TEGRETOL) 200 MG tablet Take 400 mg by mouth 2 (two) times daily.     Historical Provider, MD  divalproex (DEPAKOTE) 250 MG DR tablet Take 750 mg by mouth daily.     Historical Provider, MD  furosemide (LASIX) 20 MG tablet Take 20 mg by mouth daily.    Historical Provider, MD  furosemide (LASIX) 40 MG tablet Take 2 tablets (80 mg total) by mouth daily as needed for fluid. 01/19/15   Rolland Porter, MD  lisinopril (PRINIVIL,ZESTRIL) 5 MG tablet TAKE ONE TABLET BY MOUTH  ONCE DAILY. 09/05/14   [name], MD  oxyCODONE-acetaminophen (PERCOCET/ROXICET) 5-325 MG tablet Take 1-2 tablets by mouth every 4 (four) hours as needed for moderate pain. 01/27/15   Cleon Gustin, MD  temazepam (RESTORIL) 15 MG capsule Take 1 capsule (15 mg total) by mouth at bedtime as needed for sleep. 12/05/14   Harvel Quale, MD    ALLERGIES:  No Known Allergies  SOCIAL HISTORY:  Social History  Substance Use Topics  . Smoking status: Former Smoker -- 1.00 packs/day for 15 years    Types: Cigarettes    Quit date: 01/11/1971  . Smokeless tobacco: Not on file  . Alcohol Use: No    FAMILY HISTORY: Family History   Problem Relation Age of Onset  . Asthma Mother   . Heart attack Father 12  . Diabetes Brother     EXAM: Triage Vitals:  BP 170/90 mmHg  Pulse 91  Temp(Src) 98.4 F (36.9 C) (Oral)  Resp 20  Ht 6' (1.829 m)  Wt 233 lb (105.688 kg)  BMI 31.59 kg/m2  SpO2 100%   CONSTITUTIONAL: Alert and oriented and responds appropriately to questions. Well-appearing; well-nourished, no significant distress HEAD: Normocephalic EYES: Conjunctivae clear, PERRL ENT: normal nose; no rhinorrhea; moist mucous membranes; pharynx without lesions noted NECK: Supple, no meningismus, no LAD  CARD: RRR; S1 and S2 appreciated; no murmurs, no clicks, no rubs, no gallops RESP: Normal chest excursion without splinting or tachypnea; breath sounds clear and equal bilaterally; no wheezes, no rhonchi, no rales, no hypoxia or respiratory distress, speaking full sentences ABD/GI: Normal bowel sounds; non-distended; soft, non-tender, no rebound, no guarding, no peritoneal signs GU: GU:  Normal external genitalia, circumcised male, normal penile shaft, no blood or discharge at the urethral meatus, 22 French Foley catheter in place, there is dark urine in his leg bag with tent of redness and no clots, no testicular masses or tenderness on exam, no scrotal masses or swelling, no hernias appreciated, 2+ femoral pulses bilaterally; no perineal erythema, warmth, subcutaneous air or crepitus; no high riding testicle, normal bilateral cremasteric reflex BACK:  The back appears normal and is non-tender to palpation, there is no CVA tenderness EXT: Normal ROM in all joints; non-tender to palpation; no edema; normal capillary refill; no cyanosis, no calf tenderness or swelling    SKIN: Normal color for age and race; warm NEURO: Moves all extremities equally, sensation to light touch intact diffusely, cranial nerves II through XII intact PSYCH: The patient's mood and manner are appropriate. Grooming and personal hygiene are  appropriate.  MEDICAL DECISION MAKING: Patient here with complaints of needing help learning how to drain the leg bag attached to his Foley catheter. Will provide patient with education. Will send urine sample to ensure no infection. He does have an appointment scheduled with Dr. Alyson Ingles in the next several days. It appears when he was discharged yesterday morning his urine was clear. He does have a mildly bloody tint but no clots and no sign of urinary retention.  ED PROGRESS: Patient's urine shows large hemoglobin, trace leukocytes and many bacteria. Discussed with Dr. Tresa Moore on call for Urology.  He agrees that patient should have a hematuria after this procedure. He agrees that patient does not need continuous bladder irrigation if he is urinating normally and only passing very small clots. He does agree with starting him on Bactrim twice a day for 5 days. We have educated patient on Foley catheter care and will discharge him home. Given first  dose of Bactrim in the emergency department. He has follow-up with Dr. Alyson Ingles scheduled. Discussed return precautions. He verbalizes understanding and is comfortable with this plan.    I personally performed the services described in this documentation, which was scribed in my presence. The recorded information has been reviewed and is accurate.    [place], DO 01/28/15 [id number]

## 2015-01-28 NOTE — ED Notes (Signed)
Beeped Dr. Tresa Moore for 2nd time.

## 2015-01-28 NOTE — ED Notes (Signed)
Pt has prostate surgery on Monday, having pain in penis, and bleeding

## 2015-01-29 DIAGNOSIS — R609 Edema, unspecified: Secondary | ICD-10-CM | POA: Diagnosis not present

## 2015-01-29 DIAGNOSIS — I5022 Chronic systolic (congestive) heart failure: Secondary | ICD-10-CM | POA: Diagnosis not present

## 2015-01-29 LAB — URINE CULTURE: CULTURE: NO GROWTH

## 2015-02-02 ENCOUNTER — Other Ambulatory Visit: Payer: Self-pay | Admitting: Urology

## 2015-02-03 DIAGNOSIS — F3110 Bipolar disorder, current episode manic without psychotic features, unspecified: Secondary | ICD-10-CM | POA: Diagnosis not present

## 2015-02-13 DIAGNOSIS — I5022 Chronic systolic (congestive) heart failure: Secondary | ICD-10-CM | POA: Diagnosis not present

## 2015-02-20 ENCOUNTER — Emergency Department (HOSPITAL_COMMUNITY): Payer: PPO

## 2015-02-20 ENCOUNTER — Emergency Department (HOSPITAL_COMMUNITY)
Admission: EM | Admit: 2015-02-20 | Discharge: 2015-02-20 | Disposition: A | Discharge status: Home / Self Care | Payer: PPO | Attending: Emergency Medicine | Admitting: Emergency Medicine

## 2015-02-20 ENCOUNTER — Encounter (HOSPITAL_COMMUNITY): Payer: Self-pay | Admitting: Emergency Medicine

## 2015-02-20 DIAGNOSIS — Z87891 Personal history of nicotine dependence: Secondary | ICD-10-CM | POA: Diagnosis not present

## 2015-02-20 DIAGNOSIS — Z7982 Long term (current) use of aspirin: Secondary | ICD-10-CM | POA: Diagnosis not present

## 2015-02-20 DIAGNOSIS — M1990 Unspecified osteoarthritis, unspecified site: Secondary | ICD-10-CM | POA: Diagnosis not present

## 2015-02-20 DIAGNOSIS — Z8719 Personal history of other diseases of the digestive system: Secondary | ICD-10-CM | POA: Diagnosis not present

## 2015-02-20 DIAGNOSIS — I10 Essential (primary) hypertension: Secondary | ICD-10-CM | POA: Diagnosis not present

## 2015-02-20 DIAGNOSIS — Z951 Presence of aortocoronary bypass graft: Secondary | ICD-10-CM | POA: Diagnosis not present

## 2015-02-20 DIAGNOSIS — I509 Heart failure, unspecified: Secondary | ICD-10-CM | POA: Diagnosis not present

## 2015-02-20 DIAGNOSIS — Z79899 Other long term (current) drug therapy: Secondary | ICD-10-CM | POA: Insufficient documentation

## 2015-02-20 DIAGNOSIS — R300 Dysuria: Secondary | ICD-10-CM

## 2015-02-20 DIAGNOSIS — I252 Old myocardial infarction: Secondary | ICD-10-CM | POA: Diagnosis not present

## 2015-02-20 DIAGNOSIS — Z8659 Personal history of other mental and behavioral disorders: Secondary | ICD-10-CM | POA: Insufficient documentation

## 2015-02-20 DIAGNOSIS — R319 Hematuria, unspecified: Secondary | ICD-10-CM

## 2015-02-20 DIAGNOSIS — N390 Urinary tract infection, site not specified: Secondary | ICD-10-CM | POA: Diagnosis not present

## 2015-02-20 DIAGNOSIS — Z87438 Personal history of other diseases of male genital organs: Secondary | ICD-10-CM | POA: Diagnosis not present

## 2015-02-20 DIAGNOSIS — N3289 Other specified disorders of bladder: Secondary | ICD-10-CM | POA: Diagnosis not present

## 2015-02-20 DIAGNOSIS — I2510 Atherosclerotic heart disease of native coronary artery without angina pectoris: Secondary | ICD-10-CM | POA: Diagnosis not present

## 2015-02-20 LAB — BASIC METABOLIC PANEL
Anion gap: 9 (ref 5–15)
BUN: 24 mg/dL — AB (ref 6–20)
CALCIUM: 8.7 mg/dL — AB (ref 8.9–10.3)
CO2: 28 mmol/L (ref 22–32)
Chloride: 103 mmol/L (ref 101–111)
Creatinine, Ser: 1.26 mg/dL — ABNORMAL HIGH (ref 0.61–1.24)
GFR calc Af Amer: >60 mL/min (ref >60)
GFR, EST NON AFRICAN AMERICAN: 54 mL/min — AB (ref >60)
GLUCOSE: 99 mg/dL (ref 65–99)
Potassium: 4.4 mmol/L (ref 3.5–5.1)
Sodium: 140 mmol/L (ref 135–145)

## 2015-02-20 LAB — URINALYSIS, ROUTINE W REFLEX MICROSCOPIC
Bilirubin Urine: NEGATIVE
Glucose, UA: NEGATIVE mg/dL
Ketones, ur: NEGATIVE mg/dL
Nitrite: NEGATIVE
Specific Gravity, Urine: 1.025 (ref 1.005–1.030)
pH: 7 (ref 5.0–8.0)

## 2015-02-20 LAB — CBC WITH DIFFERENTIAL/PLATELET
BASOS ABS: 0 10*3/uL (ref 0.0–0.1)
Basophils Relative: 0 %
EOS PCT: 4 %
Eosinophils Absolute: 0.4 10*3/uL (ref 0.0–0.7)
HCT: 37.8 % — ABNORMAL LOW (ref 39.0–52.0)
Hemoglobin: 12.2 g/dL — ABNORMAL LOW (ref 13.0–17.0)
LYMPHS PCT: 8 %
Lymphs Abs: 0.6 10*3/uL — ABNORMAL LOW (ref 0.7–4.0)
MCH: 30.3 pg (ref 26.0–34.0)
MCHC: 32.3 g/dL (ref 30.0–36.0)
MCV: 93.8 fL (ref 78.0–100.0)
MONO ABS: 0.4 10*3/uL (ref 0.1–1.0)
Monocytes Relative: 5 %
Neutro Abs: 6.9 10*3/uL (ref 1.7–7.7)
Neutrophils Relative %: 83 %
PLATELETS: 161 10*3/uL (ref 150–400)
RBC: 4.03 MIL/uL — ABNORMAL LOW (ref 4.22–5.81)
RDW: 13.7 % (ref 11.5–15.5)
WBC: 8.4 10*3/uL (ref 4.0–10.5)

## 2015-02-20 LAB — URINE MICROSCOPIC-ADD ON
BACTERIA UA: NONE SEEN
SQUAMOUS EPITHELIAL / LPF: NONE SEEN

## 2015-02-20 MED ORDER — ACETAMINOPHEN 500 MG PO TABS: 1000.0000 mg | ORAL_TABLET | Freq: Once | ORAL | Status: DC

## 2015-02-20 MED ORDER — ACETAMINOPHEN 325 MG PO TABS
650.0000 mg | ORAL_TABLET | Freq: Once | ORAL | Status: AC
  Administered 2015-02-20: 650 mg via ORAL
  Filled 2015-02-20: qty 2

## 2015-02-20 MED ORDER — CEPHALEXIN 500 MG PO CAPS
500.0000 mg | ORAL_CAPSULE | Freq: Once | ORAL | Status: AC
  Administered 2015-02-20: 500 mg via ORAL
  Filled 2015-02-20: qty 1

## 2015-02-20 MED ORDER — CEPHALEXIN 500 MG PO CAPS: 500.0000 mg | ORAL_CAPSULE | Freq: Four times a day (QID) | ORAL | Status: DC

## 2015-02-20 NOTE — ED Notes (Signed)
Patient complaining of blood in his urine starting today. States "I'm supposed to have a biopsy because they found a tumor in my bladder." Denies pain at this time.

## 2015-02-20 NOTE — Discharge Instructions (Signed)
°Emergency Department Resource Guide °1) Find a Doctor and Pay Out of Pocket °Although you won't have to find out who is covered by your insurance plan, it is a good idea to ask around and get recommendations. You will then need to call the office and see if the doctor you have chosen will accept you as a new patient and what types of options they offer for patients who are self-pay. Some doctors offer discounts or will set up payment plans for their patients who do not have insurance, but you will need to ask so you aren't surprised when you get to your appointment. ° °2) Contact Your Local Health Department °Not all health departments have doctors that can see patients for sick visits, but many do, so it is worth a call to see if yours does. If you don't know where your local health department is, you can check in your phone book. The CDC also has a tool to help you locate your state's health department, and many state websites also have listings of all of their local health departments. ° °3) Find a Walk-in Clinic °If your illness is not likely to be very severe or complicated, you may want to try a walk in clinic. These are popping up all over the country in pharmacies, drugstores, and shopping centers. They're usually staffed by nurse practitioners or physician assistants that have been trained to treat common illnesses and complaints. They're usually fairly quick and inexpensive. However, if you have serious medical issues or chronic medical problems, these are probably not your best option. ° °No Primary Care Doctor: °- Call [facility] at  832-8000 - they can help you locate a primary care doctor that  accepts your insurance, provides certain services, etc. °- Physician Referral Service- 1-800-533-3463 ° °Chronic Pain Problems: °Organization         Address  Phone   Notes  °[facility]  (336) 297-2271 Patients need to be referred by their primary care doctor.  ° °Medication  Assistance: °Organization         Address  Phone   Notes  °[place] [facility] [address] °[place], [place] [address] (336) 641-8030 --Must be a resident of [place] °-- Must have NO insurance coverage whatsoever (no Medicaid/ Medicare, etc.) °-- The pt. MUST have a primary care doctor that directs their care regularly and follows them in the community °  °[facility]  (866) 331-1348   °[employer]  (888) 892-1162   ° °Agencies that provide inexpensive medical care: °Organization         Address  Phone   Notes  °[facility]  (336) 832-8035   °[facility]    (336) 832-7272   °[facility] [address] °[place],  [address] (336) 832-4777   °[facility] [address], °[place] (336) 271-4999   °[facility]    (336) 373-0678   °[facility]    (336) 272-1050   °[facility] ° [address], [place] Phone:  (336) 832-4444, Fax:  (336) 832-4440 Hours of Operation:  9 am - 6 pm, M-F.  Also accepts Medicaid/Medicare and self-pay.  °[facility] ° [address], [place] Phone: (336) [phone], Fax: (336) [phone]. Hours of Operation:  8:30 am - 5:30 pm, M-F.  Also accepts Medicaid and self-pay.  °[facility] [place] [address], [place] Phone: (336) 878-6027   °[facility] [address], [place], [place] (336)723-1848, Ext. 123 Mondays & Thursdays: 7-9 AM.  First 15 patients are seen on a first come, first serve basis. °  ° °Medicaid-accepting [place] Providers: ° °Organization         Address  Phone   Notes  °[facility] [address], [place] (336) 641-2100 Also accepts self-pay patients.  °[facility] [address], [place] ° (336) 856-9996   °[facility] [address], [place]  (336) 288-8857   °[facility] [address] [place] [address], [place] (336) 299-7000   °Veita Bland [address], [place]  ° (336) 373-1557 Only accepts [place] Access Medicaid patients after they have their name applied to their card.  ° °Self-Pay (no insurance) in [place]: ° °Organization         Address  Phone   Notes  °Sickle Cell Patients, [facility] [address], [place] (336) 832-1970   °[facility] [address], [place] (336) 832-4400   ° [facility] [place] ° [address], [place] (336) 992-4800   °[facility]/Dr. Osei-Bonsu ° [address] [place] [address], [place] or [address], [place] (336) 841-8500 Phone number for both [place] and [place] locations is the same.  °[facility] [address], [place] (336) 299-0000   °[facility] [address] [place] [address], [place] or [address] (336) 852-7530 °(336) 878-2260   °[facility] [address], [place] (336) 350-1642, phone; (336) 294-5005, fax Sees patients 1st and 3rd Saturday of every month.  Must not qualify for public or private insurance (i.e. Medicaid, Medicare, [place] Health Choice, Veterans' Benefits) • Household income should be no more than 200% of the poverty level •The clinic cannot treat you if you are pregnant or think you are pregnant • Sexually transmitted diseases are not treated at the clinic.  ° ° °Dental Care: °Organization         Address  Phone  Notes  °[place] [facility] [address], [place] (336) 641-6152 Accepts children up to age 21 who are enrolled in Medicaid or [place] Health Choice; pregnant women with a Medicaid card; and children who have applied for Medicaid or [place] Health Choice, but were declined, whose parents can pay a reduced fee at time of service.  °[place]  [facility] [place]  [address], [place] (336) 641-7733 Accepts children up to age 21 who are enrolled in Medicaid or [place] Health Choice; pregnant women with a Medicaid card; and children who have applied for Medicaid or [place] Health Choice, but were declined, whose parents can pay a reduced fee at time of service.  °[facility] ° [address],  (336) 641-4533 Patients are seen by appointment only. Walk-ins are not accepted. [facility] will see patients 18 years of age and older. °Monday - Tuesday (8am-5pm) °Most Wednesdays (8:30-5pm) °$30 per visit, cash only  °[facility] ° [address], [place] (336) 641-4533 Patients are seen by appointment only. Walk-ins are not accepted. [facility] will see patients 18 years of age and older. °One   Wednesday Evening (Monthly: Volunteer Based).  $30 per visit, cash only  °[facility]  (919) 537-3737 for adults; Children under age 4, call Graduate Pediatric Dentistry at ([phone]) [phone]. Children aged 4-14, please call ([phone]) [phone] to request a pediatric application. ° Dental services are provided in all areas of dental care including fillings, crowns and bridges, complete and partial dentures, implants, gum treatment, root canals, and extractions. Preventive care is also provided. Treatment is provided to both adults and children. °Patients are selected via a lottery and there is often a waiting list. °  °[facility] [address], °[place] ° (336) 763-8833 [email or web address] °  °[facility] [address], [place], [place] (336)723-1848, Ext. 123 Second and Fourth Thursday of each month, opens at 6:30 AM; Clinic ends at 9 AM.  Patients are seen on a first-come first-served basis, and a limited number are seen during each clinic.  ° °[facility] ° [address], [place], [place] (336) 723-7904    Eligibility Requirements °You must have lived in [place], or [place] counties for at least the last three months. °  You cannot be eligible for state or federal sponsored healthcare insurance, including Veterans Administration, Medicaid, or Medicare. °  You generally cannot be eligible for healthcare insurance through your employer.  °  How to apply: °Eligibility screenings are held every Tuesday and Wednesday afternoon from 1:00 pm until 4:00 pm. You do not need an appointment for the interview!  °[facility] [address], [place], [place] 336-631-2330   °[facility]  336-342-8273   °[facility]  336-703-3100   °[facility]  336-570-6415   ° °Behavioral Health Resources in the Community: °Intensive Outpatient Programs °Organization         Address  Phone  Notes  °[facility] [address], [place], [place] 336-878-6098   °[facility] [address], [place], [place] 336-832-9800   °[facility] [address], [place], [place] ° 336-882-2125   °[facility] [address],  °[place], [place] 1-800-853-5163 or 336-641-4981   °Substance Abuse Resources °Organization         Address  Phone  Notes  °[facility]  336-882-2125   °[facility]  336-784-9470   °[facility]  336-285-9073   °[facility]  336-845-3988   °Residential & Outpatient Substance Abuse Program  1-800-659-3381   °Psychological Services °Organization         Address  Phone  Notes  °[facility]  336- 832-9600   °[facility]  336- 378-7881   °[facility] [address], [place] 1-800-853-5163 or 336-641-4981   ° °Mobile Crisis Teams °Organization         Address  Phone  Notes  °[facility], Mobile Crisis Care Unit  1-877-626-1772   °Assertive °[facility] ° [address]  [place], [place] 336-834-9664   °Sharon DeEsch [address] °[place] 336-554-5454   ° °Self-Help/Support Groups °Organization         Address  Phone             Notes  °[facility]  - variety of support groups  336- 373-1402 Call for more information  °Narcotics Anonymous (NA), [facility] [address], °[place]  2 meetings at this location  ° °  Residential Treatment Programs Organization         Address  Phone  Notes  [facility] [address]    [place]  1-(814)858-4998   [facility]  [address], [place] [id number], [place], [place]   [place], [place] (617)489-8121 Admissions: 8am-3pm M-F  [facility]cent[facility] Substance [place] [address],    [place], [place] [id number]   [facility] [address] [place], [place], [place]   [facility] [address]  [place], [place]   [facility] - Intensive Outpatient [address], [place], [place]   [facility] ([place].,  [place], [place] 1-5864799485 or 458-701-9539   [facility] [address] [place], [place] Accepts Medicaid  [facility] [address]  [place] 1-(215)821-5348 Substance Abuse/Addiction Treatment   [facility] Organization         Address  Phone  Notes  [facility]  425-414-6390   Domenic Schwab, PhD [address] Arlis Porta [place], [place]   425-222-8603 or (346)[phone]   [place], [place] 956-750-3466   [facility] [address], [place], [place] (430)296-1655 Insurance/Medicaid/sponsorship through [facility] and Families [address] [place]                                    [place], [place] [phone] [place] [address], [place] [phone]    Dr. Adele Schilder  416-829-8784   [facility] Dept. 1) [address] [place] 2) [place] 3)  [address], [place] [phone]  (424)[phone]   [place] (410)[phone] or 856-867-2398 (After Hours)      Take the prescription as directed.  Call your regular Urologist today to schedule a follow up appointment within the next 3 days.  Return to the Emergency Department immediately sooner if worsening.

## 2015-02-20 NOTE — ED Provider Notes (Signed)
CSN: [id number]     Arrival date & time 02/20/15  1106 History   First MD Initiated Contact with Patient 02/20/15 1326     Chief Complaint  Patient presents with  . Hematuria      HPI Pt was seen at 1330. Per pt, c/o gradual onset and persistence of several intermittent episodes of hematuria since earlier this morning. Has been associated with dysuria and mild suprapubic abd "pain." Endorses hx of a "bladder tumor" and is scheduled for Uro MD f/u on 03/02/15. Denies perineal pain, no testicular pain/swelling, no back pain, no fevers, no rash.    Past Medical History  Diagnosis Date  . Coronary atherosclerosis of native coronary artery     Multivessel s/p CABG in 06/2001 post-IMI, LVEF 35% up to 50% postoperatively;  . Essential hypertension, benign   . Hyperlipidemia   . History of bipolar disorder   . Allergic rhinitis   . Erectile dysfunction   . Atrial fibrillation ([place])     Remote history - WARCEF  . Bipolar 1 disorder ([place])   . Depression   . Myocardial infarction ([place]) 2002  . CHF (congestive heart failure) ([place]) ?2002  . Asthma     AS CHILD - NO RECENT PROBLEMS  . Arthritis   . IBS (irritable bowel syndrome)   . BPH (benign prostatic hyperplasia)   . Dysuria   . Foley catheter in place   . Difficulty sleeping    Past Surgical History  Procedure Laterality Date  . Tonsillectomy    . Circumcision  10/2003  . Colonoscopy N/A 07/11/2012    Procedure: COLONOSCOPY;  Surgeon: Rogene Houston, MD;  Location: [facility];  Service: Endoscopy;  Laterality: N/A;  830-moved to 930 Ann to notify pt  . Cataract extraction w/phaco Left 12/29/2014    Procedure: CATARACT EXTRACTION PHACO AND INTRAOCULAR LENS PLACEMENT (IOC);  Surgeon: Williams Che, MD;  Location: [facility];  Service: Ophthalmology;  Laterality: Left;  CDE:3.71  . Coronary artery bypass graft  08/2000    Dr. Servando Snare - LIMA to LAD, SVG to diagonal, SVG to PDA  . Transurethral resection of prostate N/A 01/26/2015   Procedure: TRANSURETHRAL RESECTION OF THE PROSTATE WITH GYRUS INSTRUMENTS;  Surgeon: Cleon Gustin, MD;  Location: [facility];  Service: Urology;  Laterality: N/A;  . Transurethral resection of bladder tumor N/A 01/26/2015    Procedure: TRANSURETHRAL RESECTION OF BLADDER TUMOR (TURBT);  Surgeon: Cleon Gustin, MD;  Location: [facility];  Service: Urology;  Laterality: N/A;   Family History  Problem Relation Age of Onset  . Asthma Mother   . Heart attack Father 44  . Diabetes Brother    Social History  Substance Use Topics  . Smoking status: Former Smoker -- 1.00 packs/day for 15 years    Types: Cigarettes    Quit date: 01/11/1971  . Smokeless tobacco: None  . Alcohol Use: No    Review of Systems ROS: Statement: All systems negative except as marked or noted in the HPI; Constitutional: Negative for fever and chills. ; ; Eyes: Negative for eye pain, redness and discharge. ; ; ENMT: Negative for ear pain, hoarseness, nasal congestion, sinus pressure and sore throat. ; ; Cardiovascular: Negative for chest pain, palpitations, diaphoresis, dyspnea and peripheral edema. ; ; Respiratory: Negative for cough, wheezing and stridor. ; ; Gastrointestinal: Negative for nausea, vomiting, diarrhea, abdominal pain, blood in stool, hematemesis, jaundice and rectal bleeding. . ; ; Genitourinary: Negative for flank pain and +dysuria, +hematuria. ; ;  Genital:  No penile drainage or rash, no testicular pain or swelling, no scrotal rash or swelling. ;; Musculoskeletal: Negative for back pain and neck pain. Negative for swelling and trauma.; ; Skin: Negative for pruritus, rash, abrasions, blisters, bruising and skin lesion.; ; Neuro: Negative for headache, lightheadedness and neck stiffness. Negative for weakness, altered level of consciousness , altered mental status, extremity weakness, paresthesias, involuntary movement, seizure and syncope.      Allergies  Review of patient's allergies indicates no known  allergies.  Home Medications   Prior to Admission medications   Medication Sig Start Date End Date Taking? Authorizing Provider  aspirin EC 81 MG tablet Take 81 mg by mouth daily.   Yes Historical Provider, MD  atorvastatin (LIPITOR) 80 MG tablet Take 80 mg by mouth at bedtime.     Yes Historical Provider, MD  carbamazepine (TEGRETOL) 200 MG tablet Take 400 mg by mouth 2 (two) times daily.    Yes Historical Provider, MD  furosemide (LASIX) 40 MG tablet Take 2 tablets (80 mg total) by mouth daily as needed for fluid. Patient taking differently: Take 40 mg by mouth daily. *Take an additional tablet if you gain 3lbs or more 01/19/15  Yes Rolland Porter, MD  lisinopril (PRINIVIL,ZESTRIL) 5 MG tablet TAKE ONE TABLET BY MOUTH ONCE DAILY. 09/05/14  Yes [name], MD  temazepam (RESTORIL) 15 MG capsule Take 1 capsule (15 mg total) by mouth at bedtime as needed for sleep. Patient taking differently: Take 15 mg by mouth at bedtime.  12/05/14  Yes Harvel Quale, MD  oxyCODONE-acetaminophen (PERCOCET/ROXICET) 5-325 MG tablet Take 1-2 tablets by mouth every 4 (four) hours as needed for moderate pain. Patient not taking: Reported on 02/20/2015 01/27/15   Cleon Gustin, MD   BP 124/85 mmHg  Pulse 79  Temp(Src) 98.1 F (36.7 C) (Oral)  Resp 16  Ht 6' (1.829 m)  Wt 224 lb (101.606 kg)  BMI 30.37 kg/m2  SpO2 99% Physical Exam  1335: Physical examination:  Nursing notes reviewed; Vital signs and O2 SAT reviewed;  Constitutional: Well developed, Well nourished, Well hydrated, In no acute distress; Head:  Normocephalic, atraumatic; Eyes: EOMI, PERRL, No scleral icterus; ENMT: Mouth and pharynx normal, Mucous membranes moist; Neck: Supple, Full range of motion, No lymphadenopathy; Cardiovascular: Regular rate and rhythm, No gallop; Respiratory: Breath sounds clear & equal bilaterally, No wheezes.  Speaking full sentences with ease, Normal respiratory effort/excursion; Chest: Nontender, Movement normal;  Abdomen: Soft, +mild suprapubic tenderness to palp. No rebound or guarding. Nondistended, Normal bowel sounds; Genitourinary: No CVA tenderness; Extremities: Pulses normal, No tenderness, No edema, No calf edema or asymmetry.; Neuro: AA&Ox3, Major CN grossly intact.  Speech clear. No gross focal motor or sensory deficits in extremities.; Skin: Color normal, Warm, Dry.    ED Course  Procedures (including critical care time) Labs Review  Imaging Review  I have personally reviewed and evaluated these images and lab results as part of my medical decision-making.   EKG Interpretation None      MDM  MDM Reviewed: previous chart, nursing note and vitals Reviewed previous: labs Interpretation: labs and CT scan     Results for orders placed or performed during the hospital encounter of 02/20/15  Urinalysis, Routine w reflex microscopic-may I&O cath if menses (not at [facility])  Result Value Ref Range   Color, Urine RED (A) YELLOW   APPearance CLOUDY (A) CLEAR   Specific Gravity, Urine 1.025 1.005 - 1.030   pH 7.0 5.0 -  8.0   Glucose, UA NEGATIVE NEGATIVE mg/dL   Hgb urine dipstick LARGE (A) NEGATIVE   Bilirubin Urine NEGATIVE NEGATIVE   Ketones, ur NEGATIVE NEGATIVE mg/dL   Protein, ur >300 (A) NEGATIVE mg/dL   Nitrite NEGATIVE NEGATIVE   Leukocytes, UA MODERATE (A) NEGATIVE  Urine microscopic-add on  Result Value Ref Range   Squamous Epithelial / LPF NONE SEEN NONE SEEN   WBC, UA TOO NUMEROUS TO COUNT 0 - 5 WBC/hpf   RBC / HPF TOO NUMEROUS TO COUNT 0 - 5 RBC/hpf   Bacteria, UA NONE SEEN NONE SEEN  Basic metabolic panel  Result Value Ref Range   Sodium 140 135 - 145 mmol/L   Potassium 4.4 3.5 - 5.1 mmol/L   Chloride 103 101 - 111 mmol/L   CO2 28 22 - 32 mmol/L   Glucose, Bld 99 65 - 99 mg/dL   BUN 24 (H) 6 - 20 mg/dL   Creatinine, Ser 1.26 (H) 0.61 - 1.24 mg/dL   Calcium 8.7 (L) 8.9 - 10.3 mg/dL   GFR calc non Af Amer 54 (L) >60 mL/min   GFR calc Af Amer >60 >60 mL/min    Anion gap 9 5 - 15  CBC with Differential  Result Value Ref Range   WBC 8.4 4.0 - 10.5 K/uL   RBC 4.03 (L) 4.22 - 5.81 MIL/uL   Hemoglobin 12.2 (L) 13.0 - 17.0 g/dL   HCT 37.8 (L) 39.0 - 52.0 %   MCV 93.8 78.0 - 100.0 fL   MCH 30.3 26.0 - 34.0 pg   MCHC 32.3 30.0 - 36.0 g/dL   RDW 13.7 11.5 - 15.5 %   Platelets 161 150 - 400 K/uL   Neutrophils Relative % 83 %   Neutro Abs 6.9 1.7 - 7.7 K/uL   Lymphocytes Relative 8 %   Lymphs Abs 0.6 (L) 0.7 - 4.0 K/uL   Monocytes Relative 5 %   Monocytes Absolute 0.4 0.1 - 1.0 K/uL   Eosinophils Relative 4 %   Eosinophils Absolute 0.4 0.0 - 0.7 K/uL   Basophils Relative 0 %   Basophils Absolute 0.0 0.0 - 0.1 K/uL   Ct Renal Stone Study 02/20/2015  CLINICAL DATA:  One day history of hematuria. Patient reports history of urinary bladder mass EXAM: CT ABDOMEN AND PELVIS WITHOUT CONTRAST TECHNIQUE: Multidetector CT imaging of the abdomen and pelvis was performed following the standard protocol without oral or intravenous contrast material administration. COMPARISON:  CT abdomen and pelvis November 13, 2014 FINDINGS: Lower chest: There is mild scarring in the anterior left base region. Lung bases otherwise are clear. There is a fairly small hiatal hernia. There are multiple foci of coronary artery calcification. Hepatobiliary: There is a small calcified granuloma in the anterior right liver apex region. No other focal liver lesions are identified on this noncontrast enhanced study. Gallbladder is somewhat contracted without apparent wall thickening given the degree of contraction of the gallbladder. There is no appreciable biliary duct dilatation. Pancreas: No pancreatic mass or inflammatory focus. Spleen: No splenic lesions are identified. There is a tiny splenule medial to the spleen posteriorly. Adrenals/Urinary Tract: Adrenals appear unremarkable bilaterally. Kidneys bilaterally show no demonstrable mass or hydronephrosis on either side. There is no appreciable  renal or ureteral calculus on either side. Within the urinary bladder posteriorly and inferiorly, there is a solid-appearing mass measuring 6.0 x 4.4 x 2.7 cm. The wall of the urinary bladder is not appreciably thickened. The urinary bladder is mildly distended at  this time. Stomach/Bowel: The wall of the rectum appears modestly thickened in a generalized manner. There is no perirectal stranding or fistula. There is no other bowel wall thickening. No mesenteric thickening is seen. No bowel obstruction. No free air or portal venous air. Vascular/Lymphatic: There is atherosclerotic calcification in the aorta. There is no abdominal aortic aneurysm. No vascular lesions are apparent on this noncontrast enhanced study. There is no demonstrable adenopathy in the abdomen or pelvis. Reproductive: Prostate does not appear enlarged. However, there is loss of clear definition between the superior prostate and inferior bladder. Seminal vesicles do not appear enlarged. There is no pelvic mass outside of the urinary bladder. No free pelvic fluid. Other: Appendix region appears unremarkable. No abscess or ascites is noted in the abdomen or pelvis. Musculoskeletal: There is degenerative change in the lumbar spine, most marked at L5-S1. There are no blastic or lytic bone lesions. There is no intramuscular or abdominal wall lesion. IMPRESSION: There is a mass in the posterior inferior urinary bladder measuring 6.0 x 4.4 x 2.7 cm. Urinary bladder neoplasm is of concern. There is no renal or ureteral calculus on either side. No hydronephrosis. The superior aspect of the prostate cannot be delineated apart from the inferior urinary bladder wall. Significance of this finding is uncertain. Correlation with PSA advised. Mild rectal wall thickening without associated stranding or fistula. Significance of this finding is uncertain. No other bowel wall thickening seen. No bowel obstruction. There is a fairly small hiatal hernia. No abscess.   Periappendiceal region normal. Multiple foci of coronary artery calcification. Electronically Signed   By: Lowella Grip III M.D.   On: 02/20/2015 14:33    1530:  H/H, BUN/Cr per baseline. Known bladder mass on CT scan; pt already has had Uro MD eval and has planned f/u. Will tx for UTI while UC is pending. Pt wants to go home now. Has tol PO well while in the ED without N/V. Has ambulated with steady gait, easy resps, NAD. Dx and testing d/w pt.  Questions answered.  Verb understanding, agreeable to d/c home with outpt f/u.     Francine Graven, DO 02/23/15 2154

## 2015-02-20 NOTE — ED Notes (Signed)
Patient with no complaints at this time. Respirations even and unlabored. Skin warm/dry. Discharge instructions reviewed with patient at this time. Patient given opportunity to voice concerns/ask questions. Patient discharged at this time and left Emergency Department with steady gait.   

## 2015-02-25 NOTE — Patient Instructions (Addendum)
YOUR PROCEDURE IS SCHEDULED ON :  03/02/15  REPORT TO [facility] MAIN ENTRANCE FOLLOW SIGNS TO EAST ELEVATOR - GO TO 3rd FLOOR CHECK IN AT 3 EAST NURSES STATION (SHORT STAY) AT:  10:00 AM  CALL THIS NUMBER IF YOU HAVE PROBLEMS THE MORNING OF SURGERY [phone]  REMEMBER:ONLY 1 PER PERSON MAY GO TO SHORT STAY WITH YOU TO GET READY THE MORNING OF YOUR SURGERY  DO NOT EAT FOOD OR DRINK LIQUIDS AFTER MIDNIGHT  TAKE THESE MEDICINES THE MORNING OF SURGERY:  CARBAMAZEPINE / CEPHALEXIN  MAY HAVE CLEAR LIQUIDS UNTIL 6:00 AM  CLEAR LIQUID DIET  Foods Allowed                                                                     Foods Excluded  Coffee and tea, regular and decaf                             liquids that you cannot  Plain Jell-O in any flavor                                             see through such as: Fruit ices (not with fruit pulp)                                     milk, soups, orange juice  Iced Popsicles                                                      All solid food Carbonated beverages, regular and diet                                    Cranberry, grape and apple juices Sports drinks like Gatorade Lightly seasoned clear broth or consume(fat free) Sugar, honey syrup _____________________________________________________________________    YOU MAY NOT HAVE ANY METAL ON YOUR BODY INCLUDING HAIR PINS AND PIERCING'S. DO NOT WEAR JEWELRY, MAKEUP, LOTIONS, POWDERS OR PERFUMES. DO NOT WEAR NAIL POLISH. DO NOT SHAVE 48 HRS PRIOR TO SURGERY. MEN MAY SHAVE FACE AND NECK.  DO NOT [place]. [facility] IS NOT RESPONSIBLE FOR VALUABLES.  CONTACTS, DENTURES OR PARTIALS MAY NOT BE WORN TO SURGERY. LEAVE SUITCASE IN CAR. CAN BE BROUGHT TO ROOM AFTER SURGERY.  PATIENTS DISCHARGED THE DAY OF SURGERY WILL NOT BE ALLOWED TO DRIVE HOME.  PLEASE READ OVER THE FOLLOWING INSTRUCTION  SHEETS _________________________________________________________________________________                                          [facility] - PREPARING FOR SURGERY  Before surgery, you can play an important  role.  Because skin is not sterile, your skin needs to be as free of germs as possible.  You can reduce the number of germs on your skin by washing with CHG (chlorahexidine gluconate) soap before surgery.  CHG is an antiseptic cleaner which kills germs and bonds with the skin to continue killing germs even after washing. Please DO NOT use if you have an allergy to CHG or antibacterial soaps.  If your skin becomes reddened/irritated stop using the CHG and inform your nurse when you arrive at Short Stay. Do not shave (including legs and underarms) for at least 48 hours prior to the first CHG shower.  You may shave your face. Please follow these instructions carefully:   1.  Shower with CHG Soap the night before surgery and the  morning of Surgery.   2.  If you choose to wash your hair, wash your hair first as usual with your  normal  Shampoo.   3.  After you shampoo, rinse your hair and body thoroughly to remove the  shampoo.                                         4.  Use CHG as you would any other liquid soap.  You can apply chg directly  to the skin and wash . Gently wash with scrungie or clean wascloth    5.  Apply the CHG Soap to your body ONLY FROM THE NECK DOWN.   Do not use on open                           Wound or open sores. Avoid contact with eyes, ears mouth and genitals (private parts).                        Genitals (private parts) with your normal soap.              6.  Wash thoroughly, paying special attention to the area where your surgery  will be performed.   7.  Thoroughly rinse your body with warm water from the neck down.   8.  DO NOT shower/wash with your normal soap after using and rinsing off  the CHG Soap .                9.  Pat yourself dry with a clean  towel.             10.  Wear clean night clothes to bed after shower             11.  Place clean sheets on your bed the night of your first shower and do not  sleep with pets.  Day of Surgery : Do not apply any lotions/deodorants the morning of surgery.  Please wear clean clothes to the hospital/surgery center.  FAILURE TO FOLLOW THESE INSTRUCTIONS MAY RESULT IN THE CANCELLATION OF YOUR SURGERY    PATIENT SIGNATURE_________________________________  ______________________________________________________________________

## 2015-02-26 ENCOUNTER — Encounter (HOSPITAL_COMMUNITY)
Admission: RE | Admit: 2015-02-26 | Discharge: 2015-02-26 | Disposition: A | Discharge status: Home / Self Care | Payer: PPO | Source: Ambulatory Visit | Attending: Urology | Admitting: Urology

## 2015-02-26 ENCOUNTER — Encounter (HOSPITAL_COMMUNITY): Payer: Self-pay

## 2015-02-26 DIAGNOSIS — C679 Malignant neoplasm of bladder, unspecified: Secondary | ICD-10-CM | POA: Insufficient documentation

## 2015-02-26 DIAGNOSIS — Z01812 Encounter for preprocedural laboratory examination: Secondary | ICD-10-CM | POA: Diagnosis not present

## 2015-02-26 HISTORY — DX: Frequency of micturition: R35.0

## 2015-02-26 LAB — BASIC METABOLIC PANEL
ANION GAP: 6 (ref 5–15)
BUN: 19 mg/dL (ref 6–20)
CALCIUM: 8.6 mg/dL — AB (ref 8.9–10.3)
CO2: 28 mmol/L (ref 22–32)
CREATININE: 1.12 mg/dL (ref 0.61–1.24)
Chloride: 102 mmol/L (ref 101–111)
GFR calc non Af Amer: >60 mL/min (ref >60)
Glucose, Bld: 96 mg/dL (ref 65–99)
Potassium: 4.4 mmol/L (ref 3.5–5.1)
SODIUM: 136 mmol/L (ref 135–145)

## 2015-02-26 LAB — CBC
HEMATOCRIT: 36.9 % — AB (ref 39.0–52.0)
HEMOGLOBIN: 11.7 g/dL — AB (ref 13.0–17.0)
MCH: 30 pg (ref 26.0–34.0)
MCHC: 31.7 g/dL (ref 30.0–36.0)
MCV: 94.6 fL (ref 78.0–100.0)
Platelets: 180 10*3/uL (ref 150–400)
RBC: 3.9 MIL/uL — ABNORMAL LOW (ref 4.22–5.81)
RDW: 14 % (ref 11.5–15.5)
WBC: 7.5 10*3/uL (ref 4.0–10.5)

## 2015-02-26 NOTE — Progress Notes (Signed)
   02/26/15 1131  OBSTRUCTIVE SLEEP APNEA  Have you ever been diagnosed with sleep apnea through a sleep study? No  Do you snore loudly (loud enough to be heard through closed doors)?  0  Do you often feel tired, fatigued, or sleepy during the daytime (such as falling asleep during driving or talking to someone)? 1  Has anyone observed you stop breathing during your sleep? 0  Do you have, or are you being treated for high blood pressure? 1  BMI more than 35 kg/m2? 0  Age > 50 (1-yes) 1  Neck circumference greater than:Male 16 inches or larger, Male 17inches or larger? 1  Male Gender (Yes=1) 1  Obstructive Sleep Apnea Score 5

## 2015-03-02 ENCOUNTER — Encounter (HOSPITAL_COMMUNITY): Payer: Self-pay

## 2015-03-02 ENCOUNTER — Encounter (HOSPITAL_COMMUNITY)
Admission: RE | Disposition: A | Discharge status: Home / Self Care | Payer: Self-pay | Source: Ambulatory Visit | Attending: Urology

## 2015-03-02 ENCOUNTER — Ambulatory Visit (HOSPITAL_COMMUNITY): Payer: PPO | Admitting: Anesthesiology

## 2015-03-02 ENCOUNTER — Ambulatory Visit (HOSPITAL_COMMUNITY)
Admission: RE | Admit: 2015-03-02 | Discharge: 2015-03-02 | Disposition: A | Discharge status: Home / Self Care | Payer: PPO | Source: Ambulatory Visit | Attending: Urology | Admitting: Urology

## 2015-03-02 DIAGNOSIS — R3915 Urgency of urination: Secondary | ICD-10-CM | POA: Insufficient documentation

## 2015-03-02 DIAGNOSIS — I110 Hypertensive heart disease with heart failure: Secondary | ICD-10-CM | POA: Diagnosis not present

## 2015-03-02 DIAGNOSIS — F319 Bipolar disorder, unspecified: Secondary | ICD-10-CM | POA: Insufficient documentation

## 2015-03-02 DIAGNOSIS — C679 Malignant neoplasm of bladder, unspecified: Secondary | ICD-10-CM | POA: Diagnosis not present

## 2015-03-02 DIAGNOSIS — I2510 Atherosclerotic heart disease of native coronary artery without angina pectoris: Secondary | ICD-10-CM | POA: Insufficient documentation

## 2015-03-02 DIAGNOSIS — Z951 Presence of aortocoronary bypass graft: Secondary | ICD-10-CM | POA: Insufficient documentation

## 2015-03-02 DIAGNOSIS — I252 Old myocardial infarction: Secondary | ICD-10-CM | POA: Diagnosis not present

## 2015-03-02 DIAGNOSIS — Z8551 Personal history of malignant neoplasm of bladder: Secondary | ICD-10-CM | POA: Diagnosis not present

## 2015-03-02 DIAGNOSIS — E785 Hyperlipidemia, unspecified: Secondary | ICD-10-CM | POA: Insufficient documentation

## 2015-03-02 DIAGNOSIS — Z7982 Long term (current) use of aspirin: Secondary | ICD-10-CM | POA: Diagnosis not present

## 2015-03-02 DIAGNOSIS — Z9079 Acquired absence of other genital organ(s): Secondary | ICD-10-CM | POA: Insufficient documentation

## 2015-03-02 DIAGNOSIS — N401 Enlarged prostate with lower urinary tract symptoms: Secondary | ICD-10-CM | POA: Diagnosis not present

## 2015-03-02 DIAGNOSIS — N3090 Cystitis, unspecified without hematuria: Secondary | ICD-10-CM | POA: Diagnosis not present

## 2015-03-02 DIAGNOSIS — I509 Heart failure, unspecified: Secondary | ICD-10-CM | POA: Diagnosis not present

## 2015-03-02 DIAGNOSIS — C672 Malignant neoplasm of lateral wall of bladder: Secondary | ICD-10-CM | POA: Insufficient documentation

## 2015-03-02 DIAGNOSIS — D494 Neoplasm of unspecified behavior of bladder: Secondary | ICD-10-CM | POA: Diagnosis not present

## 2015-03-02 DIAGNOSIS — Z79899 Other long term (current) drug therapy: Secondary | ICD-10-CM | POA: Insufficient documentation

## 2015-03-02 DIAGNOSIS — M1990 Unspecified osteoarthritis, unspecified site: Secondary | ICD-10-CM | POA: Diagnosis not present

## 2015-03-02 DIAGNOSIS — G4730 Sleep apnea, unspecified: Secondary | ICD-10-CM | POA: Diagnosis not present

## 2015-03-02 DIAGNOSIS — I10 Essential (primary) hypertension: Secondary | ICD-10-CM | POA: Diagnosis not present

## 2015-03-02 DIAGNOSIS — Z87891 Personal history of nicotine dependence: Secondary | ICD-10-CM | POA: Diagnosis not present

## 2015-03-02 HISTORY — PX: CYSTOSCOPY W/ RETROGRADES: SHX1426

## 2015-03-02 HISTORY — PX: TRANSURETHRAL RESECTION OF BLADDER TUMOR WITH GYRUS (TURBT-GYRUS): SHX6458

## 2015-03-02 SURGERY — TRANSURETHRAL RESECTION OF BLADDER TUMOR WITH GYRUS (TURBT-GYRUS)
Anesthesia: General

## 2015-03-02 MED ORDER — SUGAMMADEX SODIUM 200 MG/2ML IV SOLN
INTRAVENOUS | Status: DC | PRN
  Administered 2015-03-02: 200 mg via INTRAVENOUS

## 2015-03-02 MED ORDER — PHENYLEPHRINE HCL 10 MG/ML IJ SOLN
INTRAMUSCULAR | Status: DC | PRN
  Administered 2015-03-02: 80 ug via INTRAVENOUS

## 2015-03-02 MED ORDER — ACETAMINOPHEN 160 MG/5ML PO SOLN: 325.0000 mg | ORAL | Status: DC | PRN

## 2015-03-02 MED ORDER — OXYCODONE HCL 5 MG/5ML PO SOLN: 5.0000 mg | Freq: Once | ORAL | Status: DC | PRN

## 2015-03-02 MED ORDER — FENTANYL CITRATE (PF) 100 MCG/2ML IJ SOLN: 25.0000 ug | INTRAMUSCULAR | Status: DC | PRN

## 2015-03-02 MED ORDER — DEXTROSE 5 % IV SOLN
INTRAVENOUS | Status: AC
  Filled 2015-03-02: qty 2

## 2015-03-02 MED ORDER — OXYCODONE HCL 5 MG PO TABS: 5.0000 mg | ORAL_TABLET | Freq: Once | ORAL | Status: DC | PRN

## 2015-03-02 MED ORDER — EPHEDRINE SULFATE 50 MG/ML IJ SOLN
INTRAMUSCULAR | Status: DC | PRN
  Administered 2015-03-02 (×3): 10 mg via INTRAVENOUS

## 2015-03-02 MED ORDER — ONDANSETRON HCL 4 MG/2ML IJ SOLN
INTRAMUSCULAR | Status: DC | PRN
  Administered 2015-03-02: 4 mg via INTRAVENOUS

## 2015-03-02 MED ORDER — IOHEXOL 300 MG/ML  SOLN
INTRAMUSCULAR | Status: DC | PRN
  Administered 2015-03-02: 10 mL

## 2015-03-02 MED ORDER — OXYCODONE-ACETAMINOPHEN 5-325 MG PO TABS: 1.0000 | ORAL_TABLET | ORAL | Status: DC | PRN

## 2015-03-02 MED ORDER — SUGAMMADEX SODIUM 200 MG/2ML IV SOLN
INTRAVENOUS | Status: AC
  Filled 2015-03-02: qty 2

## 2015-03-02 MED ORDER — DEXTROSE 5 % IV SOLN
2.0000 g | INTRAVENOUS | Status: AC
  Administered 2015-03-02: 2 g via INTRAVENOUS

## 2015-03-02 MED ORDER — SUCCINYLCHOLINE CHLORIDE 20 MG/ML IJ SOLN
INTRAMUSCULAR | Status: DC | PRN
  Administered 2015-03-02: 100 mg via INTRAVENOUS

## 2015-03-02 MED ORDER — SODIUM CHLORIDE 0.9 % IJ SOLN
INTRAMUSCULAR | Status: AC
Start: 2015-03-02 — End: 2015-03-02
  Filled 2015-03-02: qty 20

## 2015-03-02 MED ORDER — BELLADONNA ALKALOIDS-OPIUM 16.2-60 MG RE SUPP
RECTAL | Status: AC
  Filled 2015-03-02: qty 1

## 2015-03-02 MED ORDER — ROCURONIUM BROMIDE 100 MG/10ML IV SOLN
INTRAVENOUS | Status: DC | PRN
  Administered 2015-03-02: 5 mg via INTRAVENOUS
  Administered 2015-03-02: 30 mg via INTRAVENOUS

## 2015-03-02 MED ORDER — ACETAMINOPHEN 325 MG PO TABS: 325.0000 mg | ORAL_TABLET | ORAL | Status: DC | PRN

## 2015-03-02 MED ORDER — FENTANYL CITRATE (PF) 100 MCG/2ML IJ SOLN
INTRAMUSCULAR | Status: AC
  Filled 2015-03-02: qty 2

## 2015-03-02 MED ORDER — FENTANYL CITRATE (PF) 100 MCG/2ML IJ SOLN
INTRAMUSCULAR | Status: DC | PRN
  Administered 2015-03-02: 50 ug via INTRAVENOUS
  Administered 2015-03-02 (×2): 25 ug via INTRAVENOUS

## 2015-03-02 MED ORDER — LIDOCAINE HCL (CARDIAC) 20 MG/ML IV SOLN
INTRAVENOUS | Status: AC
  Filled 2015-03-02: qty 5

## 2015-03-02 MED ORDER — SODIUM CHLORIDE 0.9 % IR SOLN
Status: DC | PRN
  Administered 2015-03-02: 6000 mL

## 2015-03-02 MED ORDER — ONDANSETRON HCL 4 MG/2ML IJ SOLN
INTRAMUSCULAR | Status: AC
  Filled 2015-03-02: qty 2

## 2015-03-02 MED ORDER — PROPOFOL 10 MG/ML IV BOLUS
INTRAVENOUS | Status: DC | PRN
  Administered 2015-03-02: 150 mg via INTRAVENOUS

## 2015-03-02 MED ORDER — LACTATED RINGERS IV SOLN
INTRAVENOUS | Status: DC
  Administered 2015-03-02: 11:00:00 via INTRAVENOUS

## 2015-03-02 MED ORDER — LIDOCAINE HCL (CARDIAC) 20 MG/ML IV SOLN
INTRAVENOUS | Status: DC | PRN
  Administered 2015-03-02: 100 mg via INTRAVENOUS

## 2015-03-02 MED ORDER — ROCURONIUM BROMIDE 100 MG/10ML IV SOLN
INTRAVENOUS | Status: AC
  Filled 2015-03-02: qty 1

## 2015-03-02 MED ORDER — EPHEDRINE SULFATE 50 MG/ML IJ SOLN
INTRAMUSCULAR | Status: AC
  Filled 2015-03-02: qty 1

## 2015-03-02 MED ORDER — PROPOFOL 10 MG/ML IV BOLUS
INTRAVENOUS | Status: AC
  Filled 2015-03-02: qty 20

## 2015-03-02 SURGICAL SUPPLY — 20 items
BAG URINE DRAINAGE (UROLOGICAL SUPPLIES) ×4 IMPLANT
BAG URO CATCHER STRL LF (MISCELLANEOUS) ×4 IMPLANT
CATH FOLEY 3WAY 30CC 22FR (CATHETERS) ×4 IMPLANT
ELECT LOOP 22F BIPOLAR SML (ELECTROSURGICAL) ×4
ELECT REM PT RETURN 9FT ADLT (ELECTROSURGICAL)
ELECTRODE LOOP 22F BIPOLAR SML (ELECTROSURGICAL) ×2 IMPLANT
ELECTRODE REM PT RTRN 9FT ADLT (ELECTROSURGICAL) IMPLANT
EVACUATOR MICROVAS BLADDER (UROLOGICAL SUPPLIES) IMPLANT
GLOVE BIOGEL M STRL SZ7.5 (GLOVE) ×4 IMPLANT
GOWN STRL REUS W/TWL LRG LVL3 (GOWN DISPOSABLE) ×8 IMPLANT
GUIDEWIRE STR DUAL SENSOR (WIRE) ×4 IMPLANT
KIT ASPIRATION TUBING (SET/KITS/TRAYS/PACK) IMPLANT
LOOP CUT BIPOLAR 24F LRG (ELECTROSURGICAL) IMPLANT
MANIFOLD NEPTUNE II (INSTRUMENTS) ×4 IMPLANT
PACK CYSTO (CUSTOM PROCEDURE TRAY) ×4 IMPLANT
PLUG CATH AND CAP STER (CATHETERS) ×4 IMPLANT
SYRINGE 3CC LL L/F (MISCELLANEOUS) ×4 IMPLANT
SYRINGE IRR TOOMEY STRL 70CC (SYRINGE) IMPLANT
TUBING CONNECTING 10 (TUBING) ×3 IMPLANT
TUBING CONNECTING 10' (TUBING) ×1

## 2015-03-02 NOTE — Anesthesia Preprocedure Evaluation (Signed)
Anesthesia Evaluation  Patient identified by MRN, date of birth, ID band Patient awake    Reviewed: Allergy & Precautions, NPO status , Patient's Chart, lab work & pertinent test results  History of Anesthesia Complications Negative for: history of anesthetic complications  Airway Mallampati: III  TM Distance: >3 FB Neck ROM: Full    Dental  (+) Teeth Intact   Pulmonary asthma , sleep apnea , former smoker,    breath sounds clear to auscultation       Cardiovascular hypertension, Pt. on medications + CAD, + Past MI, + CABG and +CHF   Rhythm:Regular     Neuro/Psych PSYCHIATRIC DISORDERS Depression Bipolar Disorder negative neurological ROS     GI/Hepatic negative GI ROS, Neg liver ROS,   Endo/Other  negative endocrine ROS  Renal/GU negative Renal ROS     Musculoskeletal  (+) Arthritis ,   Abdominal   Peds  Hematology   Anesthesia Other Findings   Reproductive/Obstetrics                             Anesthesia Physical Anesthesia Plan  ASA: III  Anesthesia Plan: General   Post-op Pain Management:    Induction: Intravenous  Airway Management Planned: Oral ETT  Additional Equipment: None  Intra-op Plan:   Post-operative Plan: Extubation in OR  Informed Consent: I have reviewed the patients History and Physical, chart, labs and discussed the procedure including the risks, benefits and alternatives for the proposed anesthesia with the patient or authorized representative who has indicated his/her understanding and acceptance.   Dental advisory given  Plan Discussed with: CRNA and Surgeon  Anesthesia Plan Comments:         Anesthesia Quick Evaluation

## 2015-03-02 NOTE — Discharge Instructions (Signed)
Transurethral Resection, Bladder Tumor A cancerous growth (tumor) can develop on the inside wall of the bladder. The bladder is the organ that holds urine. One way to remove the tumor is a procedure called a transurethral resection. The tumor is removed (resected) through the tube that carries urine from the bladder out of the body (urethra). No cuts (incisions) are made in the skin. Instead, the procedure is done through a thin telescope, called a resectoscope. Attached to it is a light and usually a tiny camera. The resectoscope is put into the urethra. In men, the urethra opens at the end of the penis. In women, it opens just above the vagina.  A transurethral resection is usually used to remove tumors that have not gotten too big or too deep. These are called Stage 0, Stage 1 or Stage 2 bladder cancers. LET YOUR CAREGIVER KNOW ABOUT:  On the day of the procedure, your caregivers will need to know the last time you had anything to eat or drink. This includes water, gum, and candy. In advance, make sure they know about:   Any allergies.  All medications you are taking, including:  Herbs, eyedrops, over-the-counter medications and creams.  Blood thinners (anticoagulants), aspirin or other drugs that could affect blood clotting.  Use of steroids (by mouth or as creams).  Previous problems with anesthetics, including local anesthetics.  Possibility of pregnancy, if this applies.  Any history of blood clots.  Any history of bleeding or other blood problems.  Previous surgery.  Smoking history.  Any recent symptoms of colds or infections.  Other health problems. RISKS AND COMPLICATIONS This is usually a safe procedure. Every procedure has risks, though. For a transurethral resection, they include:  Infection. Antibiotic medication would need to be taken.  Bleeding.  Light bleeding may last for several days after the procedure.  If bleeding continues or is heavy, the bladder may  need rinsing. Or, a new catheter might be put in for awhile.  Sometimes bed rest is needed.  Urination problems.  Pain and burning can occur when urinating. This usually goes away in a few days.  Scarring from the procedure can block the flow of urine.  Bladder damage.  It can be punctured or torn during removal of the tumor. If this happens, a catheter might be needed for longer. Antibiotics would be taken while the bladder heals.  Urine can leak through the hole or tear into the abdomen. If this happens, surgery may be needed to repair the bladder. BEFORE THE PROCEDURE   A medical evaluation will be done. This may include:  A physical examination.  Urine test. This is to make sure you do not have a urinary tract infection.  Blood tests.  A test that checks the heart's rhythm (electrocardiogram).  Talking with an anesthesiologist. This is the person who will be in charge of the medication (anesthesia) to keep you from feeling pain during the transurethral resection. You might be asleep during the procedure (general anesthesia) or numb from the waist down, but awake during the procedure (spinal anesthesia). Ask your surgeon what to expect.  The person who is having a transurethral resection needs to give what is called informed consent. This requires signing a legal paper that gives permission for the procedure. To give informed consent:  You must understand how the procedure is done and why.  You must be told all the risks and benefits of the procedure.  You must sign the consent. Sometimes a legal guardian  can do this.  Signing should be witnessed by a healthcare professional.  The day before the surgery, eat only a light dinner. Then, do not eat or drink anything for at least 8 hours before the surgery. Ask your caregiver if it is OK to take any needed medicines with a sip of water.  Arrive at least an hour before the surgery or whenever your surgeon recommends. This will  give you time to check in and fill out any needed paperwork. PROCEDURE  The preparation:  You will change into a hospital gown.  A needle will be inserted in your arm. This is an intravenous access tube (IV). Medication will be able to flow directly into your body through this needle.  Small monitors will be put on your body. They are used to check your heart, blood pressure, and oxygen level.  You might be given medication that will help you relax (sedative).  You will be given a general anesthetic or spinal anesthesia.  The procedure:  Once you are asleep or numb from the waist down, your legs will be placed in stirrups.  The resectoscope will be passed through the urethra into the bladder.  Fluid will be passed through the resectoscope. This will fill the bladder with water.  The surgeon will examine the bladder through the scope. If the scope has a camera, it can take pictures from inside the bladder. They can be projected onto a TV screen.  The surgeon will use various tools to remove the tumor in small pieces. Sometimes a laser (a beam of light energy) is used. Other tools may use electric current.  A tube (catheter) will often be placed so that urine can drain into a bag outside the body. This process helps stop bleeding. This tube keeps blood clots from blocking the urethra.  The procedure usually takes 30 to 45 minutes. AFTER THE PROCEDURE   You will stay in a recovery area until the anesthesia has worn off. Your blood pressure and pulse will be checked every so often. Then you will be taken to a hospital room.  You may continue to get fluids through the IV for awhile.  Some pain is normal. The catheter might be uncomfortable. Pain is usually not severe. If it is, ask for pain medicine.  Your urine may look bloody after a transurethral resection. This is normal.  If bleeding is heavy, a hospital caregiver may rinse out the bladder (irrigation) through the  catheter.  Once the urine is clear, the catheter will be taken out.  You will need to stay in the hospital until you can urinate on your own.  Most people stay in the hospital for up to 4 days. PROGNOSIS   Transurethral resection is considered the best way to treat bladder tumors that are not too far along. For most people, the treatment is successful. Sometimes, though, more treatment is needed.  Bladder cancers can come back even after a successful procedure. Because of this, be sure to have a checkup with your caregiver every 3 to 6 months. If everything is OK for 3 years, you can reduce the checkups to once a year.   This information is not intended to replace advice given to you by your health care provider. Make sure you discuss any questions you have with your health care provider.   Document Released: 10/23/2008 Document Revised: 03/21/2011 Document Reviewed: 12/29/2008  General Anesthesia, Adult, Care After Refer to this sheet in the next few weeks. These instructions  provide you with information on caring for yourself after your procedure. Your health care provider may also give you more specific instructions. Your treatment has been planned according to current medical practices, but problems sometimes occur. Call your health care provider if you have any problems or questions after your procedure. WHAT TO EXPECT AFTER THE PROCEDURE After the procedure, it is typical to experience:  Sleepiness.  Nausea and vomiting. HOME CARE INSTRUCTIONS  For the first 24 hours after general anesthesia:  Have a responsible person with you.  Do not drive a car. If you are alone, do not take public transportation.  Do not drink alcohol.  Do not take medicine that has not been prescribed by your health care provider.  Do not sign important papers or make important decisions.  You may resume a normal diet and activities as directed by your health care provider.  Change bandages  (dressings) as directed.  If you have questions or problems that seem related to general anesthesia, call the hospital and ask for the anesthetist or anesthesiologist on call. SEEK MEDICAL CARE IF:  You have nausea and vomiting that continue the day after anesthesia.  You develop a rash. SEEK IMMEDIATE MEDICAL CARE IF:   You have difficulty breathing.  You have chest pain.  You have any allergic problems.   This information is not intended to replace advice given to you by your health care provider. Make sure you discuss any questions you have with your health care provider.   Document Released: 04/04/2000 Document Revised: 01/17/2014 Document Reviewed: 04/27/2011 Elsevier Interactive Patient Education [employer].

## 2015-03-02 NOTE — Transfer of Care (Signed)
Immediate Anesthesia Transfer of Care Note  Patient: Grant Stevens  Procedure(s) Performed: Procedure(s): TRANSURETHRAL RESECTION OF BLADDER TUMOR WITH GYRUS (TURBT-GYRUS) (N/A) CYSTOSCOPY WITH RIGHT RETROGRADE PYELOGRAM,  ATTEMPTED LEFT RETROGRADE PYELOGRAM (Bilateral)  Patient Location: PACU  Anesthesia Type:General  Level of Consciousness:  sedated, patient cooperative and responds to stimulation  Airway & Oxygen Therapy:Patient Spontanous Breathing and Patient connected to face mask oxgen  Post-op Assessment:  Report given to PACU RN and Post -op Vital signs reviewed and stable  Post vital signs:  Reviewed and stable  Last Vitals:  Filed Vitals:   03/02/15 0947 03/02/15 1225  BP: 137/76 144/83  Pulse: 72 98  Temp: 36.5 C 36.6 C  Resp: 16 16    Complications: No apparent anesthesia complications

## 2015-03-02 NOTE — Brief Op Note (Signed)
03/02/2015  12:10 PM  PATIENT:  Grant Stevens  75 y.o. male  PRE-OPERATIVE DIAGNOSIS:  BLADDER CANCER  POST-OPERATIVE DIAGNOSIS:  BLADDER CANCER  PROCEDURE:  Procedure(s): TRANSURETHRAL RESECTION OF BLADDER TUMOR WITH GYRUS (TURBT-GYRUS) (N/A) CYSTOSCOPY WITH RIGHT RETROGRADE PYELOGRAM,  ATTEMPTED LEFT RETROGRADE PYELOGRAM (Bilateral)  SURGEON:  Surgeon(s) and Role:    * Cleon Gustin, MD - Primary  PHYSICIAN ASSISTANT:   ASSISTANTS: none   ANESTHESIA:   general  EBL:  Total I/O In: 700 [I.V.:700] Out: -   BLOOD ADMINISTERED:none  DRAINS: Urinary Catheter (Foley)   LOCAL MEDICATIONS USED:  NONE  SPECIMEN:  Source of Specimen:  left lateral wall bladder tumor  DISPOSITION OF SPECIMEN:  PATHOLOGY  COUNTS:  YES  TOURNIQUET:  * No tourniquets in log *  DICTATION: .Note written in EPIC  PLAN OF CARE: Discharge to home after PACU  PATIENT DISPOSITION:  PACU - hemodynamically stable.   Delay start of Pharmacological VTE agent (>24hrs) due to surgical blood loss or risk of bleeding: not applicable

## 2015-03-02 NOTE — Anesthesia Postprocedure Evaluation (Signed)
Anesthesia Post Note  Patient: Grant Stevens  Procedure(s) Performed: Procedure(s) (LRB): TRANSURETHRAL RESECTION OF BLADDER TUMOR WITH GYRUS (TURBT-GYRUS) (N/A) CYSTOSCOPY WITH RIGHT RETROGRADE PYELOGRAM,  ATTEMPTED LEFT RETROGRADE PYELOGRAM (Bilateral)  Patient location during evaluation: PACU Anesthesia Type: General Level of consciousness: awake Pain management: pain level controlled Vital Signs Assessment: post-procedure vital signs reviewed and stable Respiratory status: spontaneous breathing Cardiovascular status: stable Postop Assessment: no signs of nausea or vomiting Anesthetic complications: no    Last Vitals:  Filed Vitals:   03/02/15 1312 03/02/15 1400  BP: 124/82 128/69  Pulse: 90 90  Temp: 36.3 C   Resp: 18 16    Last Pain:  Filed Vitals:   03/02/15 1423  PainSc: 0-No pain                 Laron Boorman

## 2015-03-02 NOTE — H&P (Signed)
Urology Admission H&P  Chief Complaint:  Urinary urgency  History of Present Illness: Mr Umstead is a 75yo with a hx of BPH s/p TURp. At the time of TURP he was found to have a left lateral wall bladder tumor. Pathology T1G3. He is here for reresection. He denies any LUTS except for occasional urinary urgency. No hematuria  Past Medical History  Diagnosis Date  . Coronary atherosclerosis of native coronary artery     Multivessel s/p CABG in 06/2001 post-IMI, LVEF 35% up to 50% postoperatively;  . Essential hypertension, benign   . Hyperlipidemia   . History of bipolar disorder   . Allergic rhinitis   . Erectile dysfunction   . Atrial fibrillation ([place])     Remote history - WARCEF  . Bipolar 1 disorder ([place])   . Depression   . Myocardial infarction ([place]) 2002  . CHF (congestive heart failure) ([place]) ?2002  . Asthma     AS CHILD - NO RECENT PROBLEMS  . Arthritis   . IBS (irritable bowel syndrome)   . Difficulty sleeping   . Frequency of urination   . BPH (benign prostatic hyperplasia)   . Cancer [facility])     BLADDER CANCER   Past Surgical History  Procedure Laterality Date  . Tonsillectomy    . Circumcision  10/2003  . Colonoscopy N/A 07/11/2012    Procedure: COLONOSCOPY;  Surgeon: Rogene Houston, MD;  Location: [facility] ENDO SUITE;  Service: Endoscopy;  Laterality: N/A;  830-moved to 930 Ann to notify pt  . Cataract extraction w/phaco Left 12/29/2014    Procedure: CATARACT EXTRACTION PHACO AND INTRAOCULAR LENS PLACEMENT (IOC);  Surgeon: Williams Che, MD;  Location: [facility] ORS;  Service: Ophthalmology;  Laterality: Left;  CDE:3.71  . Transurethral resection of prostate N/A 01/26/2015    Procedure: TRANSURETHRAL RESECTION OF THE PROSTATE WITH GYRUS INSTRUMENTS;  Surgeon: Cleon Gustin, MD;  Location: [facility] ORS;  Service: Urology;  Laterality: N/A;  . Transurethral resection of bladder tumor N/A 01/26/2015    Procedure: TRANSURETHRAL RESECTION OF BLADDER TUMOR (TURBT);  Surgeon: Cleon Gustin, MD;  Location: [facility] ORS;  Service: Urology;  Laterality: N/A;  . Coronary artery bypass graft  08/2000    Dr. Servando Snare - LIM[place] to LAD, SVG to diagonal, SVG to PDA TRIPLE BYPASS    Home Medications:  Prescriptions prior to admission  Medication Sig Dispense Refill Last Dose  . aspirin EC 81 MG tablet Take 81 mg by mouth daily.   Past Week at Unknown time  . atorvastatin (LIPITOR) 80 MG tablet Take 80 mg by mouth at bedtime.     03/01/2015 at pm  . carbamazepine (TEGRETOL) 200 MG tablet Take 400 mg by mouth 2 (two) times daily.    03/02/2015 at 0700  . cephALEXin (KEFLEX) 500 MG capsule Take 1 capsule (500 mg total) by mouth 4 (four) times daily. 40 capsule 0 03/02/2015 at 0700  . furosemide (LASIX) 40 MG tablet Take 2 tablets (80 mg total) by mouth daily as needed for fluid. (Patient taking differently: Take 40 mg by mouth daily. *Take an additional tablet if you gain 3lbs or more) 10 tablet 0 03/01/2015 at Unknown time  . lisinopril (PRINIVIL,ZESTRIL) 5 MG tablet TAKE ONE TABLET BY MOUTH ONCE DAILY. 90 tablet 1 03/01/2015 at Unknown time  . temazepam (RESTORIL) 15 MG capsule Take 1 capsule (15 mg total) by mouth at bedtime as needed for sleep. (Patient taking differently: Take 15 mg by mouth at bedtime. )  15 capsule 0 03/01/2015 at pm  . oxyCODONE-acetaminophen (PERCOCET/ROXICET) 5-325 MG tablet Take 1-2 tablets by mouth every 4 (four) hours as needed for moderate pain. (Patient not taking: Reported on 02/20/2015) 30 tablet 0    Allergies: No Known Allergies  Family History  Problem Relation Age of Onset  . Asthma Mother   . Heart attack Father 64  . Diabetes Brother    Social History:  reports that he quit smoking about 44 years ago. His smoking use included Cigarettes. He has a 15 pack-year smoking history. He does not have any smokeless tobacco history on file. He reports that he does not drink alcohol or use illicit drugs.  Review of Systems  All other systems reviewed and are  negative.   Physical Exam:  Vital signs in last 24 hours: Temp:  [97.7 F (36.5 C)] 97.7 F (36.5 C) (02/20 0947) Pulse Rate:  [72] 72 (02/20 0947) Resp:  [16] 16 (02/20 0947) BP: (137)/(76) 137/76 mmHg (02/20 0947) SpO2:  [99 %] 99 % (02/20 0947) Weight:  [102.967 kg (227 lb)] 102.967 kg (227 lb) (02/20 0950) Physical Exam  Constitutional: He is oriented to person, place, and time. He appears well-developed and well-nourished.  HENT:  Head: Normocephalic and atraumatic.  Eyes: EOM are normal. Pupils are equal, round, and reactive to light.  Neck: Normal range of motion. No thyromegaly present.  Cardiovascular: Normal rate and regular rhythm.   Respiratory: Effort normal. No respiratory distress.  GI: Soft. He exhibits no distension and no mass. There is no tenderness. There is no rebound and no guarding.  Musculoskeletal: Normal range of motion.  Neurological: He is alert and oriented to person, place, and time.  Skin: Skin is warm and dry.  Psychiatric: He has a normal mood and affect. His behavior is normal. Judgment and thought content normal.    Laboratory Data:  No results found for this or any previous visit (from the past 24 hour(s)). No results found for this or any previous visit (from the past 240 hour(s)). Creatinine:  Recent Labs  02/26/15 1200  CREATININE 1.12   Baseline Creatinine: 1.1  Impression/Assessment:  75 yo with BPH and T1G3 bladder cancer  Plan:  The risks/benefits/alternatives to TURBT was explained to the patient and he understands and wishes to proceed with surgery  Suni Jarnagin L 03/02/2015, 11:03 AM

## 2015-03-02 NOTE — Anesthesia Procedure Notes (Signed)
Procedure Name: Intubation Date/Time: 03/02/2015 11:38 AM Performed by: Maxwell Caul Pre-anesthesia Checklist: Patient identified, Emergency Drugs available, Suction available and Patient being monitored Patient Re-evaluated:Patient Re-evaluated prior to inductionOxygen Delivery Method: Circle System Utilized Preoxygenation: Pre-oxygenation with 100% oxygen Intubation Type: IV induction Ventilation: Mask ventilation without difficulty Laryngoscope Size: Glidescope and 4 (Elective due to history) Grade View: Grade I Tube type: Oral Tube size: 7.5 mm Number of attempts: 1 Airway Equipment and Method: Stylet Placement Confirmation: ETT inserted through vocal cords under direct vision,  positive ETCO2 and breath sounds checked- equal and bilateral Secured at: 21 cm Tube secured with: Tape Dental Injury: Teeth and Oropharynx as per pre-operative assessment

## 2015-03-03 NOTE — Op Note (Signed)
.  Preoperative diagnosis: bladder tumor  Postoperative diagnosis: Same  Procedure: 1 cystoscopy 2. right retrograde pyelography 3.  Intraoperative fluoroscopy, under one hour, with interpretation 4. Transurethral resection of bladder tumor, medium  Attending: Rosie Fate  Anesthesia: General  Estimated blood loss: Minimal  Drains: 22 French foley  Specimens: bladder tumor  Antibiotics: ancef  Findings: left lateral wall irritation and fibrinous material covering previous resection site. Ureteral orifices in normal anatomic location. No hydronephrosis or filling defects in the right collecting system. Unable to identify the left ureteral orifice  Indications: Patient is a 75 year old male with a history of T1G3 bladder cancer here for reresection.  After discussing treatment options, they decided proceed with transurethral resection of a bladder tumor.  Procedure her in detail: The patient was brought to the operating room and a brief timeout was done to ensure correct patient, correct procedure, correct site.  General anesthesia was administered patient was placed in dorsal lithotomy position.  Their genitalia was then prepped and draped in usual sterile fashion.  A rigid 105 French cystoscope was passed in the urethra and the bladder.  Bladder was inspected and we noted a left lateral wall irritation and fibrinous material covering previous resection site.  The right ureteral orifices wasin the normal orthotopic location but we were unable to identify the left ureteral orifice.  We then turned our attention to the right side. a 6 french ureteral catheter was then instilled into the right ureteral orifice.  a gentle retrograde was obtained and findings noted above. We then removed the cystoscope and placed a resectoscope into the bladder.. Using the bipolar resectoscope we removed the bladder tumor down to the base. A subsequent muscle deep biopsy was then taken. Hemostasis was then  obtained with electrocautery. We then removed the bladder tumor chips and sent them for pathology. We then re-inspected the bladder and found no residula bleeding.  the bladder was then drained, a 22 French foley was placed and this concluded the procedure which was well tolerated by patient.  Complications: None  Condition: Stable, extubated, transferred to PACU  Plan: Patient is to be discharged home. He is to followup in 5 days for foley catheter removal and pathology discussion.

## 2015-03-04 ENCOUNTER — Ambulatory Visit: Payer: PPO | Admitting: Urology

## 2015-03-09 DIAGNOSIS — C672 Malignant neoplasm of lateral wall of bladder: Secondary | ICD-10-CM | POA: Diagnosis not present

## 2015-03-11 ENCOUNTER — Ambulatory Visit: Payer: PPO | Admitting: Urology

## 2015-03-12 DIAGNOSIS — Z79899 Other long term (current) drug therapy: Secondary | ICD-10-CM | POA: Diagnosis not present

## 2015-03-12 DIAGNOSIS — I2510 Atherosclerotic heart disease of native coronary artery without angina pectoris: Secondary | ICD-10-CM | POA: Diagnosis not present

## 2015-03-19 DIAGNOSIS — I5022 Chronic systolic (congestive) heart failure: Secondary | ICD-10-CM | POA: Diagnosis not present

## 2015-03-19 DIAGNOSIS — C679 Malignant neoplasm of bladder, unspecified: Secondary | ICD-10-CM | POA: Diagnosis not present

## 2015-03-23 NOTE — Patient Instructions (Signed)
Your procedure is scheduled on : 03/30/2015  Report to [facility] at  44   AM.  Call this number if you have problems the morning of surgery: [phone]   Do not eat food or drink liquids :After Midnight.      Take these medicines the morning of surgery with A SIP OF WATER: tegretol, lisinopril.   Do not wear jewelry, make-up or nail polish.  Do not wear lotions, powders, or perfumes. You may wear deodorant.  Do not shave 48 hours prior to surgery.  Do not bring valuables to the hospital.  Contacts, dentures or bridgework may not be worn into surgery.  Leave suitcase in the car. After surgery it may be brought to your room.  For patients admitted to the hospital, checkout time is 11:00 AM the day of discharge.   Patients discharged the day of surgery will not be allowed to drive home.  :     Please read over the following fact sheets that you were given: Coughing and Deep Breathing, Surgical Site Infection Prevention, Anesthesia Post-op Instructions and Care and Recovery After Surgery    Cataract A cataract is a clouding of the lens of the eye. When a lens becomes cloudy, vision is reduced based on the degree and nature of the clouding. Many cataracts reduce vision to some degree. Some cataracts make people more near-sighted as they develop. Other cataracts increase glare. Cataracts that are ignored and become worse can sometimes look white. The white color can be seen through the pupil. CAUSES   Aging. However, cataracts may occur at any age, even in newborns.   Certain drugs.   Trauma to the eye.   Certain diseases such as diabetes.   Specific eye diseases such as chronic inflammation inside the eye or a sudden attack of a rare form of glaucoma.   Inherited or acquired medical problems.  SYMPTOMS   Gradual, progressive drop in vision in the affected eye.   Severe, rapid visual loss. This most often happens when trauma is the cause.  DIAGNOSIS  To detect a cataract, an eye  doctor examines the lens. Cataracts are best diagnosed with an exam of the eyes with the pupils enlarged (dilated) by drops.  TREATMENT  For an early cataract, vision may improve by using different eyeglasses or stronger lighting. If that does not help your vision, surgery is the only effective treatment. A cataract needs to be surgically removed when vision loss interferes with your everyday activities, such as driving, reading, or watching TV. A cataract may also have to be removed if it prevents examination or treatment of another eye problem. Surgery removes the cloudy lens and usually replaces it with a substitute lens (intraocular lens, IOL).  At a time when both you and your doctor agree, the cataract will be surgically removed. If you have cataracts in both eyes, only one is usually removed at a time. This allows the operated eye to heal and be out of danger from any possible problems after surgery (such as infection or poor wound healing). In rare cases, a cataract may be doing damage to your eye. In these cases, your caregiver may advise surgical removal right away. The vast majority of people who have cataract surgery have better vision afterward. HOME CARE INSTRUCTIONS  If you are not planning surgery, you may be asked to do the following:  Use different eyeglasses.   Use stronger or brighter lighting.   Ask your eye doctor about reducing your  medicine dose or changing medicines if it is thought that a medicine caused your cataract. Changing medicines does not make the cataract go away on its own.   Become familiar with your surroundings. Poor vision can lead to injury. Avoid bumping into things on the affected side. You are at a higher risk for tripping or falling.   Exercise extreme care when driving or operating machinery.   Wear sunglasses if you are sensitive to bright light or experiencing problems with glare.  SEEK IMMEDIATE MEDICAL CARE IF:   You have a worsening or sudden  vision loss.   You notice redness, swelling, or increasing pain in the eye.   You have a fever.  Document Released: 12/27/2004 Document Revised: 12/16/2010 Document Reviewed: 08/20/2010 [facility] Patient Information 2012 [place].PATIENT INSTRUCTIONS POST-ANESTHESIA  IMMEDIATELY FOLLOWING SURGERY:  Do not drive or operate machinery for the first twenty four hours after surgery.  Do not make any important decisions for twenty four hours after surgery or while taking narcotic pain medications or sedatives.  If you develop intractable nausea and vomiting or a severe headache please notify your doctor immediately.  FOLLOW-UP:  Please make an appointment with your surgeon as instructed. You do not need to follow up with anesthesia unless specifically instructed to do so.  WOUND CARE INSTRUCTIONS (if applicable):  Keep a dry clean dressing on the anesthesia/puncture wound site if there is drainage.  Once the wound has quit draining you may leave it open to air.  Generally you should leave the bandage intact for twenty four hours unless there is drainage.  If the epidural site drains for more than 36-48 hours please call the anesthesia department.  QUESTIONS?:  Please feel free to call your physician or the hospital operator if you have any questions, and they will be happy to assist you.

## 2015-03-24 ENCOUNTER — Encounter (HOSPITAL_COMMUNITY): Payer: Self-pay

## 2015-03-24 ENCOUNTER — Encounter (HOSPITAL_COMMUNITY)
Admission: RE | Admit: 2015-03-24 | Discharge: 2015-03-24 | Disposition: A | Discharge status: Home / Self Care | Payer: PPO | Source: Ambulatory Visit | Attending: Ophthalmology | Admitting: Ophthalmology

## 2015-03-24 DIAGNOSIS — H538 Other visual disturbances: Secondary | ICD-10-CM | POA: Diagnosis not present

## 2015-03-24 DIAGNOSIS — H2511 Age-related nuclear cataract, right eye: Secondary | ICD-10-CM | POA: Diagnosis not present

## 2015-03-27 MED ORDER — CYCLOPENTOLATE-PHENYLEPHRINE OP SOLN OPTIME - NO CHARGE
OPHTHALMIC | Status: AC
  Filled 2015-03-27: qty 2

## 2015-03-27 MED ORDER — LIDOCAINE HCL 3.5 % OP GEL
OPHTHALMIC | Status: AC
  Filled 2015-03-27: qty 1

## 2015-03-27 MED ORDER — TETRACAINE HCL 0.5 % OP SOLN
OPHTHALMIC | Status: AC
  Filled 2015-03-27: qty 4

## 2015-03-30 ENCOUNTER — Encounter (HOSPITAL_COMMUNITY): Payer: Self-pay | Admitting: *Deleted

## 2015-03-30 ENCOUNTER — Ambulatory Visit (HOSPITAL_COMMUNITY): Payer: PPO | Admitting: Anesthesiology

## 2015-03-30 ENCOUNTER — Encounter (HOSPITAL_COMMUNITY)
Admission: RE | Disposition: A | Discharge status: Home / Self Care | Payer: Self-pay | Source: Ambulatory Visit | Attending: Ophthalmology

## 2015-03-30 ENCOUNTER — Ambulatory Visit (HOSPITAL_COMMUNITY)
Admission: RE | Admit: 2015-03-30 | Discharge: 2015-03-30 | Disposition: A | Discharge status: Home / Self Care | Payer: PPO | Source: Ambulatory Visit | Attending: Ophthalmology | Admitting: Ophthalmology

## 2015-03-30 DIAGNOSIS — I4891 Unspecified atrial fibrillation: Secondary | ICD-10-CM | POA: Insufficient documentation

## 2015-03-30 DIAGNOSIS — F329 Major depressive disorder, single episode, unspecified: Secondary | ICD-10-CM | POA: Insufficient documentation

## 2015-03-30 DIAGNOSIS — I2510 Atherosclerotic heart disease of native coronary artery without angina pectoris: Secondary | ICD-10-CM | POA: Diagnosis not present

## 2015-03-30 DIAGNOSIS — I509 Heart failure, unspecified: Secondary | ICD-10-CM | POA: Diagnosis not present

## 2015-03-30 DIAGNOSIS — H538 Other visual disturbances: Secondary | ICD-10-CM | POA: Diagnosis not present

## 2015-03-30 DIAGNOSIS — H269 Unspecified cataract: Secondary | ICD-10-CM | POA: Insufficient documentation

## 2015-03-30 DIAGNOSIS — H2511 Age-related nuclear cataract, right eye: Secondary | ICD-10-CM | POA: Diagnosis not present

## 2015-03-30 DIAGNOSIS — I252 Old myocardial infarction: Secondary | ICD-10-CM | POA: Diagnosis not present

## 2015-03-30 DIAGNOSIS — I110 Hypertensive heart disease with heart failure: Secondary | ICD-10-CM | POA: Diagnosis not present

## 2015-03-30 DIAGNOSIS — G4730 Sleep apnea, unspecified: Secondary | ICD-10-CM | POA: Diagnosis not present

## 2015-03-30 DIAGNOSIS — Z79899 Other long term (current) drug therapy: Secondary | ICD-10-CM | POA: Insufficient documentation

## 2015-03-30 DIAGNOSIS — Z7982 Long term (current) use of aspirin: Secondary | ICD-10-CM | POA: Insufficient documentation

## 2015-03-30 HISTORY — PX: CATARACT EXTRACTION W/PHACO: SHX586

## 2015-03-30 SURGERY — PHACOEMULSIFICATION, CATARACT, WITH IOL INSERTION
Anesthesia: Monitor Anesthesia Care | Laterality: Right

## 2015-03-30 MED ORDER — LIDOCAINE HCL 3.5 % OP GEL: 1.0000 "application " | Freq: Once | OPHTHALMIC | Status: DC

## 2015-03-30 MED ORDER — FENTANYL CITRATE (PF) 100 MCG/2ML IJ SOLN
25.0000 ug | INTRAMUSCULAR | Status: AC
  Administered 2015-03-30 (×2): 25 ug via INTRAVENOUS

## 2015-03-30 MED ORDER — LACTATED RINGERS IV SOLN
INTRAVENOUS | Status: DC
  Administered 2015-03-30: 07:00:00 via INTRAVENOUS

## 2015-03-30 MED ORDER — MIDAZOLAM HCL 2 MG/2ML IJ SOLN
1.0000 mg | INTRAMUSCULAR | Status: DC | PRN
  Administered 2015-03-30: 2 mg via INTRAVENOUS

## 2015-03-30 MED ORDER — CYCLOPENTOLATE-PHENYLEPHRINE 0.2-1 % OP SOLN
1.0000 [drp] | OPHTHALMIC | Status: AC
  Administered 2015-03-30 (×3): 1 [drp] via OPHTHALMIC

## 2015-03-30 MED ORDER — NA HYALUR & NA CHOND-NA HYALUR 0.55-0.5 ML IO KIT
PACK | INTRAOCULAR | Status: DC | PRN
  Administered 2015-03-30: 1 via OPHTHALMIC

## 2015-03-30 MED ORDER — TETRACAINE 0.5 % OP SOLN OPTIME - NO CHARGE
OPHTHALMIC | Status: DC | PRN
  Administered 2015-03-30: 2 [drp] via OPHTHALMIC

## 2015-03-30 MED ORDER — BSS IO SOLN
INTRAOCULAR | Status: DC | PRN
  Administered 2015-03-30: 15 mL

## 2015-03-30 MED ORDER — FENTANYL CITRATE (PF) 100 MCG/2ML IJ SOLN
INTRAMUSCULAR | Status: AC
  Filled 2015-03-30: qty 2

## 2015-03-30 MED ORDER — POVIDONE-IODINE 5 % OP SOLN
OPHTHALMIC | Status: DC | PRN
  Administered 2015-03-30: 1 via OPHTHALMIC

## 2015-03-30 MED ORDER — TETRACAINE HCL 0.5 % OP SOLN
1.0000 [drp] | OPHTHALMIC | Status: AC
  Administered 2015-03-30 (×3): 1 [drp] via OPHTHALMIC

## 2015-03-30 MED ORDER — ONDANSETRON HCL 4 MG/2ML IJ SOLN: 4.0000 mg | Freq: Once | INTRAMUSCULAR | Status: DC | PRN

## 2015-03-30 MED ORDER — MIDAZOLAM HCL 2 MG/2ML IJ SOLN
INTRAMUSCULAR | Status: AC
  Filled 2015-03-30: qty 2

## 2015-03-30 MED ORDER — FENTANYL CITRATE (PF) 100 MCG/2ML IJ SOLN: 25.0000 ug | INTRAMUSCULAR | Status: DC | PRN

## 2015-03-30 MED ORDER — PHENYLEPHRINE-KETOROLAC 1-0.3 % IO SOLN
INTRAOCULAR | Status: AC
  Filled 2015-03-30: qty 4

## 2015-03-30 MED ORDER — LIDOCAINE HCL 3.5 % OP GEL
OPHTHALMIC | Status: DC | PRN
  Administered 2015-03-30: 1 via OPHTHALMIC

## 2015-03-30 MED ORDER — PHENYLEPHRINE-KETOROLAC 1-0.3 % IO SOLN
INTRAOCULAR | Status: DC | PRN
  Administered 2015-03-30: 500 mL via OPHTHALMIC

## 2015-03-30 SURGICAL SUPPLY — 9 items
CLOTH BEACON ORANGE TIMEOUT ST (SAFETY) ×3 IMPLANT
GLOVE BIOGEL PI IND STRL 6.5 (GLOVE) ×1 IMPLANT
GLOVE BIOGEL PI IND STRL 7.0 (GLOVE) ×1 IMPLANT
GLOVE BIOGEL PI INDICATOR 6.5 (GLOVE) ×2
GLOVE BIOGEL PI INDICATOR 7.0 (GLOVE) ×2
INST SET CATARACT ~~LOC~~ (KITS) ×3 IMPLANT
LENS ALC ACRYL/TECN (Ophthalmic Related) ×3 IMPLANT
PAD ARMBOARD 7.5X6 YLW CONV (MISCELLANEOUS) ×3 IMPLANT
WATER STERILE IRR 250ML POUR (IV SOLUTION) ×3 IMPLANT

## 2015-03-30 NOTE — Brief Op Note (Signed)
03/30/2015  8:32 AM  PATIENT:  Grant Stevens  74 y.o. male  PRE-OPERATIVE DIAGNOSIS:  cataract right eye, difficulty performing daily activities  POST-OPERATIVE DIAGNOSIS:  cataract right eye, difficulty performing daily activities  PROCEDURE:  Procedure(s): CATARACT EXTRACTION PHACO AND INTRAOCULAR LENS PLACEMENT RIGHT EYE CDE=2.56  SURGEON:  Surgeon(s): Williams Che, MD  ASSISTANTS:   Zoila Shutter, CST   ANESTHESIA STAFF: Anesthesiologist: Lerry Liner, MD CRNA: Vista Deck, CRNA  ANESTHESIA:   topical and MAC  REQUESTED LENS POWER: 15.0  LENS IMPLANT INFORMATION:  Alcon SN60WF  +15.00  S/n [id number]  Exp 02/2019  CUMULATIVE DISSIPATED ENERGY:2.56  INDICATIONS:see office H&P for details  OP FINDINGS:soft NS  COMPLICATIONS:None  DICTATION #: none  PLAN OF CARE:   As above  PATIENT DISPOSITION:  Short Stay

## 2015-03-30 NOTE — Anesthesia Preprocedure Evaluation (Signed)
Anesthesia Evaluation  Patient identified by MRN, date of birth, ID band Patient awake    Reviewed: Allergy & Precautions, NPO status , Patient's Chart, lab work & pertinent test results  Airway Mallampati: III  TM Distance: >3 FB     Dental  (+) Teeth Intact   Pulmonary asthma , sleep apnea , former smoker,    breath sounds clear to auscultation       Cardiovascular hypertension, Pt. on medications + CAD, + Past MI and +CHF  + dysrhythmias Atrial Fibrillation  Rhythm:Regular Rate:Normal     Neuro/Psych PSYCHIATRIC DISORDERS Depression Bipolar Disorder    GI/Hepatic   Endo/Other    Renal/GU      Musculoskeletal   Abdominal   Peds  Hematology   Anesthesia Other Findings   Reproductive/Obstetrics                             Anesthesia Physical Anesthesia Plan  ASA: III  Anesthesia Plan: MAC   Post-op Pain Management:    Induction: Intravenous  Airway Management Planned: Nasal Cannula  Additional Equipment:   Intra-op Plan:   Post-operative Plan:   Informed Consent: I have reviewed the patients History and Physical, chart, labs and discussed the procedure including the risks, benefits and alternatives for the proposed anesthesia with the patient or authorized representative who has indicated his/her understanding and acceptance.     Plan Discussed with:   Anesthesia Plan Comments:         Anesthesia Quick Evaluation

## 2015-03-30 NOTE — Transfer of Care (Signed)
Immediate Anesthesia Transfer of Care Note  Patient: Grant Stevens  Procedure(s) Performed: Procedure(s) (LRB): CATARACT EXTRACTION PHACO AND INTRAOCULAR LENS PLACEMENT RIGHT EYE CDE=2.56 (Right)  Patient Location: Shortstay  Anesthesia Type: MAC  Level of Consciousness: awake  Airway & Oxygen Therapy: Patient Spontanous Breathing   Post-op Assessment: Report given to PACU RN, Post -op Vital signs reviewed and stable and Patient moving all extremities  Post vital signs: Reviewed and stable  Complications: No apparent anesthesia complications

## 2015-03-30 NOTE — Op Note (Signed)
03/30/2015  8:32 AM  PATIENT:  Grant Stevens  75 y.o. male  PRE-OPERATIVE DIAGNOSIS:  cataract right eye, difficulty performing daily activities  POST-OPERATIVE DIAGNOSIS:  cataract right eye, difficulty performing daily activities  PROCEDURE:  Procedure(s): CATARACT EXTRACTION PHACO AND INTRAOCULAR LENS PLACEMENT RIGHT EYE CDE=2.56  SURGEON:  Surgeon(s): Williams Che, MD  ASSISTANTS:   Zoila Shutter, CST   ANESTHESIA STAFF: Anesthesiologist: Lerry Liner, MD CRNA: Vista Deck, CRNA  ANESTHESIA:   topical and MAC  REQUESTED LENS POWER: 15.0  LENS IMPLANT INFORMATION:  Alcon SN60WF  +15.00  S/n [id number]  Exp 02/2019  CUMULATIVE DISSIPATED ENERGY:2.56  INDICATIONS:see office H&P for details  OP FINDINGS:soft NS  COMPLICATIONS:None  PROCEDURE:  The patient was brought to the operating room in good condition.  The operative eye was prepped and draped in the usual fashion for intraocular surgery.  Lidocaine gel was dropped onto the eye.  A 2.4 mm 10 O'clock near clear corneal stepped incision and a 12 O'clock stab incision were created.  Viscoat was instilled into the anterior chamber.  The 5 mm anterior capsulorhexis was performed with a bent needle cystotome and Utrata forceps.  The lens was hydrodissected and hydrodelineated with a cannula and balanced salt solution and rotated with a Kuglen hook.  Phacoemulsification was perfomed in the divide and conquer technique.  The remaining cortex was removed with I&A and the capsular surfaces polished as necessary.  Provisc was placed into the capsular bag and the lens inserted with the Alcon inserter.  The viscoelastic was removed with I&A and the lens "rocked" into position.  The wounds were hydrated and te anterior chamber was refilled with balanced salt solution.  The wounds were checked for leakage and rehydrated as necessary.  The lid speculum and drapes were removed and the patient was transported to short stay in good  condition.  PATIENT DISPOSITION:  Short Stay

## 2015-03-30 NOTE — Anesthesia Postprocedure Evaluation (Signed)
  Anesthesia Post-op Note  Patient: Grant Stevens  Procedure(s) Performed: Procedure(s) (LRB): CATARACT EXTRACTION PHACO AND INTRAOCULAR LENS PLACEMENT RIGHT EYE CDE=2.56 (Right)  Patient Location:  Short Stay  Anesthesia Type: MAC  Level of Consciousness: awake  Airway and Oxygen Therapy: Patient Spontanous Breathing  Post-op Pain: none  Post-op Assessment: Post-op Vital signs reviewed, Patient's Cardiovascular Status Stable, Respiratory Function Stable, Patent Airway, No signs of Nausea or vomiting and Pain level controlled  Post-op Vital Signs: Reviewed and stable  Complications: No apparent anesthesia complications

## 2015-03-30 NOTE — H&P (Signed)
I have reviewed the pre printed H&P, the patient was re-examined, and I have identified no significant interval changes in the patient's medical condition.  There is no change in the plan of care since the history and physical of record. 

## 2015-03-30 NOTE — Anesthesia Procedure Notes (Signed)
Procedure Name: MAC Date/Time: 03/30/2015 7:49 AM Performed by: Vista Deck Pre-anesthesia Checklist: Patient identified, Emergency Drugs available, Suction available, Timeout performed and Patient being monitored Patient Re-evaluated:Patient Re-evaluated prior to inductionOxygen Delivery Method: Nasal Cannula

## 2015-03-30 NOTE — Discharge Instructions (Signed)
Cataract Surgery, Care After °Refer to this sheet in the next few weeks. These instructions provide you with information on caring for yourself after your procedure. Your caregiver may also give you more specific instructions. Your treatment has been planned according to current medical practices, but problems sometimes occur. Call your caregiver if you have any problems or questions after your procedure.  °HOME CARE INSTRUCTIONS  °· Avoid strenuous activities as directed by your caregiver. °· Ask your caregiver when you can resume driving. °· Use eyedrops or other medicines to help healing and control pressure inside your eye as directed by your caregiver. °· Only take over-the-counter or prescription medicines for pain, discomfort, or fever as directed by your caregiver. °· Do not to touch or rub your eyes. °· You may be instructed to use a protective shield during the first few days and nights after surgery. If not, wear sunglasses to protect your eyes. This is to protect the eye from pressure or from being accidentally bumped. °· Keep the area around your eye clean and dry. Avoid swimming or allowing water to hit you directly in the face while showering. Keep soap and shampoo out of your eyes. °· Do not bend or lift heavy objects. Bending increases pressure in the eye. You can walk, climb stairs, and do light household chores. °· Do not put a contact lens into the eye that had surgery until your caregiver says it is okay to do so. °· Ask your doctor when you can return to work. This will depend on the kind of work that you do. If you work in a dusty environment, you may be advised to wear protective eyewear for a period of time. °· Ask your caregiver when it will be safe to engage in sexual activity. °· Continue with your regular eye exams as directed by your caregiver. °What to expect: °· It is normal to feel itching and mild discomfort for a few days after cataract surgery. Some fluid discharge is also common,  and your eye may be sensitive to light and touch. °· After 1 to 2 days, even moderate discomfort should disappear. In most cases, healing will take about 6 weeks. °· If you received an intraocular lens (IOL), you may notice that colors are very bright or have a blue tinge. Also, if you have been in bright sunlight, everything may appear reddish for a few hours. If you see these color tinges, it is because your lens is clear and no longer cloudy. Within a few months after receiving an IOL, these extra colors should go away. When you have healed, you will probably need new glasses. °SEEK MEDICAL CARE IF:  °· You have increased bruising around your eye. °· You have discomfort not helped by medicine. °SEEK IMMEDIATE MEDICAL CARE IF:  °· You have a  fever. °· You have a worsening or sudden vision loss. °· You have redness, swelling, or increasing pain in the eye. °· You have a thick discharge from the eye that had surgery. °MAKE SURE YOU: °· Understand these instructions. °· Will watch your condition. °· Will get help right away if you are not doing well or get worse. °  °This information is not intended to replace advice given to you by your health care provider. Make sure you discuss any questions you have with your health care provider. °  °Document Released: 07/16/2004 Document Revised: 01/17/2014 Document Reviewed: 08/20/2010 °Elsevier Interactive Patient Education ©2016 [employer] ° °

## 2015-03-31 ENCOUNTER — Encounter (HOSPITAL_COMMUNITY): Payer: Self-pay | Admitting: Ophthalmology

## 2015-04-06 DIAGNOSIS — F3110 Bipolar disorder, current episode manic without psychotic features, unspecified: Secondary | ICD-10-CM | POA: Diagnosis not present

## 2015-04-14 DIAGNOSIS — C672 Malignant neoplasm of lateral wall of bladder: Secondary | ICD-10-CM | POA: Diagnosis not present

## 2015-04-20 DIAGNOSIS — Z79899 Other long term (current) drug therapy: Secondary | ICD-10-CM | POA: Diagnosis not present

## 2015-04-20 DIAGNOSIS — I2510 Atherosclerotic heart disease of native coronary artery without angina pectoris: Secondary | ICD-10-CM | POA: Diagnosis not present

## 2015-04-20 DIAGNOSIS — E785 Hyperlipidemia, unspecified: Secondary | ICD-10-CM | POA: Diagnosis not present

## 2015-04-21 DIAGNOSIS — C672 Malignant neoplasm of lateral wall of bladder: Secondary | ICD-10-CM | POA: Diagnosis not present

## 2015-04-21 DIAGNOSIS — Z5111 Encounter for antineoplastic chemotherapy: Secondary | ICD-10-CM | POA: Diagnosis not present

## 2015-04-28 DIAGNOSIS — Z5111 Encounter for antineoplastic chemotherapy: Secondary | ICD-10-CM | POA: Diagnosis not present

## 2015-04-28 DIAGNOSIS — E785 Hyperlipidemia, unspecified: Secondary | ICD-10-CM | POA: Diagnosis not present

## 2015-04-28 DIAGNOSIS — C672 Malignant neoplasm of lateral wall of bladder: Secondary | ICD-10-CM | POA: Diagnosis not present

## 2015-04-28 DIAGNOSIS — I5022 Chronic systolic (congestive) heart failure: Secondary | ICD-10-CM | POA: Diagnosis not present

## 2015-05-05 DIAGNOSIS — C672 Malignant neoplasm of lateral wall of bladder: Secondary | ICD-10-CM | POA: Diagnosis not present

## 2015-05-05 DIAGNOSIS — Z5111 Encounter for antineoplastic chemotherapy: Secondary | ICD-10-CM | POA: Diagnosis not present

## 2015-05-12 DIAGNOSIS — C672 Malignant neoplasm of lateral wall of bladder: Secondary | ICD-10-CM | POA: Diagnosis not present

## 2015-05-12 DIAGNOSIS — Z5111 Encounter for antineoplastic chemotherapy: Secondary | ICD-10-CM | POA: Diagnosis not present

## 2015-05-22 ENCOUNTER — Encounter (HOSPITAL_COMMUNITY): Payer: Self-pay

## 2015-05-22 ENCOUNTER — Emergency Department (HOSPITAL_COMMUNITY)
Admission: EM | Admit: 2015-05-22 | Discharge: 2015-05-22 | Disposition: A | Discharge status: Home / Self Care | Payer: PPO | Attending: Emergency Medicine | Admitting: Emergency Medicine

## 2015-05-22 DIAGNOSIS — E785 Hyperlipidemia, unspecified: Secondary | ICD-10-CM | POA: Diagnosis not present

## 2015-05-22 DIAGNOSIS — S30860D Insect bite (nonvenomous) of lower back and pelvis, subsequent encounter: Secondary | ICD-10-CM | POA: Diagnosis not present

## 2015-05-22 DIAGNOSIS — Z79899 Other long term (current) drug therapy: Secondary | ICD-10-CM | POA: Insufficient documentation

## 2015-05-22 DIAGNOSIS — I110 Hypertensive heart disease with heart failure: Secondary | ICD-10-CM | POA: Diagnosis not present

## 2015-05-22 DIAGNOSIS — Z7982 Long term (current) use of aspirin: Secondary | ICD-10-CM | POA: Insufficient documentation

## 2015-05-22 DIAGNOSIS — S30860A Insect bite (nonvenomous) of lower back and pelvis, initial encounter: Secondary | ICD-10-CM | POA: Diagnosis not present

## 2015-05-22 DIAGNOSIS — Y990 Civilian activity done for income or pay: Secondary | ICD-10-CM | POA: Diagnosis not present

## 2015-05-22 DIAGNOSIS — J45909 Unspecified asthma, uncomplicated: Secondary | ICD-10-CM | POA: Insufficient documentation

## 2015-05-22 DIAGNOSIS — Y929 Unspecified place or not applicable: Secondary | ICD-10-CM | POA: Insufficient documentation

## 2015-05-22 DIAGNOSIS — W57XXXA Bitten or stung by nonvenomous insect and other nonvenomous arthropods, initial encounter: Secondary | ICD-10-CM | POA: Insufficient documentation

## 2015-05-22 DIAGNOSIS — I4891 Unspecified atrial fibrillation: Secondary | ICD-10-CM | POA: Diagnosis not present

## 2015-05-22 DIAGNOSIS — I2510 Atherosclerotic heart disease of native coronary artery without angina pectoris: Secondary | ICD-10-CM | POA: Insufficient documentation

## 2015-05-22 DIAGNOSIS — Z87891 Personal history of nicotine dependence: Secondary | ICD-10-CM | POA: Insufficient documentation

## 2015-05-22 DIAGNOSIS — C672 Malignant neoplasm of lateral wall of bladder: Secondary | ICD-10-CM | POA: Diagnosis not present

## 2015-05-22 DIAGNOSIS — F319 Bipolar disorder, unspecified: Secondary | ICD-10-CM | POA: Diagnosis not present

## 2015-05-22 DIAGNOSIS — I252 Old myocardial infarction: Secondary | ICD-10-CM | POA: Insufficient documentation

## 2015-05-22 DIAGNOSIS — I509 Heart failure, unspecified: Secondary | ICD-10-CM | POA: Diagnosis not present

## 2015-05-22 DIAGNOSIS — Y9389 Activity, other specified: Secondary | ICD-10-CM | POA: Diagnosis not present

## 2015-05-22 NOTE — ED Provider Notes (Signed)
TIME SEEN: 2:15 AM  CHIEF COMPLAINT: Tick bites  HPI: Pt is a 75 y.o. male with history of coronary artery disease, bladder cancer, bipolar disorder who presents to the emergency department with several tick bites to his back. 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 He states that he will cover them with clear fingernail polish which kills them but he has been unable to reach to of these ticks on his back.  He denies that he is having any symptoms. No headache, neck pain, fever, rash, vomiting. Has had mild diarrhea which is chronic and unchanged. States he is feeling great.  ROS: See HPI Constitutional: no fever  Eyes: no drainage  ENT: no runny nose   Cardiovascular:  no chest pain  Resp: no SOB  GI: no vomiting GU: no dysuria Integumentary: no rash  Allergy: no hives  Musculoskeletal: no leg swelling  Neurological: no slurred speech ROS otherwise negative  PAST MEDICAL HISTORY/PAST SURGICAL HISTORY:  Past Medical History  Diagnosis Date  . Coronary atherosclerosis of native coronary artery     Multivessel s/p CABG in 06/2001 post-IMI, LVEF 35% up to 50% postoperatively;  . Essential hypertension, benign   . Hyperlipidemia   . History of bipolar disorder   . Allergic rhinitis   . Erectile dysfunction   . Atrial fibrillation ([place])     Remote history - WARCEF  . Bipolar 1 disorder ([place])   . Depression   . Myocardial infarction ([place]) 2002  . CHF (congestive heart failure) ([place]) ?2002  . Asthma     AS CHILD - NO RECENT PROBLEMS  . Arthritis   . IBS (irritable bowel syndrome)   . Difficulty sleeping   . Frequency of urination   . BPH (benign prostatic hyperplasia)   . Cancer [facility])     BLADDER CANCER    MEDICATIONS:  Prior to Admission medications   Medication Sig Start Date End Date Taking? Authorizing Provider  aspirin EC 81 MG tablet Take 81 mg by mouth daily.   Yes Historical Provider, MD  atorvastatin (LIPITOR) 80 MG  tablet Take 80 mg by mouth at bedtime.     Yes Historical Provider, MD  carbamazepine (TEGRETOL) 200 MG tablet Take 400 mg by mouth 2 (two) times daily.    Yes Historical Provider, MD  divalproex (DEPAKOTE) 250 MG DR tablet Take 250 mg by mouth 3 (three) times daily.   Yes Historical Provider, MD  furosemide (LASIX) 40 MG tablet Take 2 tablets (80 mg total) by mouth daily as needed for fluid. Patient taking differently: Take 40 mg by mouth daily. *Take an additional tablet if you gain 3lbs or more 01/19/15  Yes Rolland Porter, MD  lisinopril (PRINIVIL,ZESTRIL) 5 MG tablet TAKE ONE TABLET BY MOUTH ONCE DAILY. 09/05/14  Yes [name], MD  Probiotic Product (DIGESTIVE ADVANTAGE GUMMIES PO) Take 2 tablets by mouth daily.   Yes Historical Provider, MD  cephALEXin (KEFLEX) 500 MG capsule Take 1 capsule (500 mg total) by mouth 4 (four) times daily. Patient not taking: Reported on 03/17/2015 02/20/15   Francine Graven, DO    ALLERGIES:  No Known Allergies  SOCIAL HISTORY:  Social History  Substance Use Topics  . Smoking status: Former Smoker -- 1.00 packs/day for 15 years    Types: Cigarettes    Quit date: 01/11/1971  . Smokeless tobacco: Not on file  . Alcohol Use: No    FAMILY HISTORY: Family History  Problem Relation Age of Onset  . Asthma Mother   . Heart attack Father 30  . Diabetes Brother     EXAM: BP 174/94 mmHg  Pulse 81  Temp(Src) 97.5 F (36.4 C) (Oral)  Resp 20  Ht 6' (1.829 m)  Wt 228 lb (103.42 kg)  BMI 30.92 kg/m2  SpO2 97% CONSTITUTIONAL: Alert and oriented and responds appropriately to questions. Well-appearing; well-nourished, Afebrile, smiling, laughing, very pleasant HEAD: Normocephalic EYES: Conjunctivae clear, PERRL ENT: normal nose; no rhinorrhea; moist mucous membranes NECK: Supple, no meningismus, no LAD  CARD: RRR; S1 and S2 appreciated; no murmurs, no clicks, no rubs, no gallops RESP: Normal chest excursion without splinting or tachypnea; breath sounds  clear and equal bilaterally; no wheezes, no rhonchi, no rales, no hypoxia or respiratory distress, speaking full sentences ABD/GI: Normal bowel sounds; non-distended; soft, non-tender, no rebound, no guarding, no peritoneal signs BACK:  The back appears normal and is non-tender to palpation, there is no CVA tenderness EXT: Normal ROM in all joints; non-tender to palpation; no edema; normal capillary refill; no cyanosis, no calf tenderness or swelling    SKIN: Normal color for age and race; warm; no rash; no rash on the palms, soles or feet, no petechia or purpura, no blisters or desquamation, patient does have small slightly raised red areas to his extremities and torso from previous tick bites with no surrounding fluctuance or induration or drainage. He does have 2 small embedded ticks in his back which we have been able to remove without residual foreign bodies NEURO: Moves all extremities equally, sensation to light touch intact diffusely, cranial nerves II through XII intact PSYCH: The patient's mood and manner are appropriate. Grooming and personal hygiene are appropriate.  MEDICAL DECISION MAKING: Patient here with several tick bites. No signs of cellulitis. He is otherwise asymptomatic. We have removed 2 ticks from his back. No residual foreign body appreciated. At this time I do not feel he needs to be on prophylactic antibiotics. I feel he is safe to be discharged home. Have recommend close outpatient follow-up if he does become symptomatic. Discussed return precautions. He verbalized understanding and is comfortable with this plan.   At this time, I do not feel there is any life-threatening condition present. I have reviewed and discussed all results (EKG, imaging, lab, urine as appropriate), exam findings with patient. I have reviewed nursing notes and appropriate previous records.  I feel the patient is safe to be discharged home without further emergent workup. Discussed usual and customary  return precautions. Patient and family (if present) verbalize understanding and are comfortable with this plan.  Patient will follow-up with their primary care provider. If they do not have a primary care provider, information for follow-up has been provided to them. All questions have been answered.    .Foreign Body Removal Date/Time: 05/22/2015 2:34 AM Performed by: Nyra Jabs Authorized by: Nyra Jabs Consent: Verbal consent obtained. Risks and benefits: risks, benefits and alternatives were discussed Consent given by: patient Patient identity confirmed: verbally with patient Body area: skin General location: trunk Location details: back Patient sedated: no Patient restrained: no Patient cooperative: yes Removal mechanism: forceps Depth: subcutaneous Complexity: simple 2 objects recovered. Objects recovered: ticks Post-procedure assessment: no residual foreign bodies remain Patient tolerance: Patient tolerated the procedure well with no immediate complications    [place], DO 05/22/15 [id number]

## 2015-05-22 NOTE — Discharge Instructions (Signed)
Tick Bite Information Ticks are insects that attach themselves to the skin and draw blood for food. There are various types of ticks. Common types include wood ticks and deer ticks. Most ticks live in shrubs and grassy areas. Ticks can climb onto your body when you make contact with leaves or grass where the tick is waiting. The most common places on the body for ticks to attach themselves are the scalp, neck, armpits, waist, and groin. Most tick bites are harmless, but sometimes ticks carry germs that cause diseases. These germs can be spread to a person during the tick's feeding process. The chance of a disease spreading through a tick bite depends on:   The type of tick.  Time of year.   How long the tick is attached.   Geographic location.  HOW CAN YOU PREVENT TICK BITES? Take these steps to help prevent tick bites when you are outdoors:  Wear protective clothing. Long sleeves and long pants are best.   Wear white clothes so you can see ticks more easily.  Tuck your pant legs into your socks.   If walking on a trail, stay in the middle of the trail to avoid brushing against bushes.  Avoid walking through areas with long grass.  Put insect repellent on all exposed skin and along boot tops, pant legs, and sleeve cuffs.   Check clothing, hair, and skin repeatedly and before going inside.   Brush off any ticks that are not attached.  Take a shower or bath as soon as possible after being outdoors.  WHAT IS THE PROPER WAY TO REMOVE A TICK? Ticks should be removed as soon as possible to help prevent diseases caused by tick bites. 1. If latex gloves are available, put them on before trying to remove a tick.  2. Using fine-point tweezers, grasp the tick as close to the skin as possible. You may also use curved forceps or a tick removal tool. Grasp the tick as close to its head as possible. Avoid grasping the tick on its body. 3. Pull gently with steady upward pressure until  the tick lets go. Do not twist the tick or jerk it suddenly. This may break off the tick's head or mouth parts. 4. Do not squeeze or crush the tick's body. This could force disease-carrying fluids from the tick into your body.  5. After the tick is removed, wash the bite area and your hands with soap and water or other disinfectant such as alcohol. 6. Apply a small amount of antiseptic cream or ointment to the bite site.  7. Wash and disinfect any instruments that were used.  Do not try to remove a tick by applying a hot match, petroleum jelly, or fingernail polish to the tick. These methods do not work and may increase the chances of disease being spread from the tick bite.  WHEN SHOULD YOU SEEK MEDICAL CARE? Contact your health care provider if you are unable to remove a tick from your skin or if a part of the tick breaks off and is stuck in the skin.  After a tick bite, you need to be aware of signs and symptoms that could be related to diseases spread by ticks. Contact your health care provider if you develop any of the following in the days or weeks after the tick bite:  Unexplained fever.  Rash. A circular rash that appears days or weeks after the tick bite may indicate the possibility of Lyme disease. The rash may resemble   a target with a bull's-eye and may occur at a different part of your body than the tick bite.  Redness and swelling in the area of the tick bite.   Tender, swollen lymph glands.   Diarrhea.   Weight loss.   Cough.   Fatigue.   Muscle, joint, or bone pain.   Abdominal pain.   Headache.   Lethargy or a change in your level of consciousness.  Difficulty walking or moving your legs.   Numbness in the legs.   Paralysis.  Shortness of breath.   Confusion.   Repeated vomiting.    This information is not intended to replace advice given to you by your health care provider. Make sure you discuss any questions you have with your health  care provider.   Document Released: 12/25/1999 Document Revised: 01/17/2014 Document Reviewed: 06/06/2012 Elsevier Interactive Patient Education 2016 [employer]  

## 2015-05-22 NOTE — ED Notes (Signed)
Pt c/o several ticks imbedded in his back

## 2015-06-12 ENCOUNTER — Other Ambulatory Visit: Payer: Self-pay | Admitting: Cardiology

## 2015-06-15 DIAGNOSIS — I509 Heart failure, unspecified: Secondary | ICD-10-CM | POA: Diagnosis not present

## 2015-06-15 DIAGNOSIS — E785 Hyperlipidemia, unspecified: Secondary | ICD-10-CM | POA: Diagnosis not present

## 2015-06-15 DIAGNOSIS — I2510 Atherosclerotic heart disease of native coronary artery without angina pectoris: Secondary | ICD-10-CM | POA: Diagnosis not present

## 2015-06-15 DIAGNOSIS — Z79899 Other long term (current) drug therapy: Secondary | ICD-10-CM | POA: Diagnosis not present

## 2015-06-19 DIAGNOSIS — C672 Malignant neoplasm of lateral wall of bladder: Secondary | ICD-10-CM | POA: Diagnosis not present

## 2015-06-22 DIAGNOSIS — E875 Hyperkalemia: Secondary | ICD-10-CM | POA: Diagnosis not present

## 2015-06-22 DIAGNOSIS — I2510 Atherosclerotic heart disease of native coronary artery without angina pectoris: Secondary | ICD-10-CM | POA: Diagnosis not present

## 2015-06-22 DIAGNOSIS — I5022 Chronic systolic (congestive) heart failure: Secondary | ICD-10-CM | POA: Diagnosis not present

## 2015-07-06 ENCOUNTER — Encounter (HOSPITAL_COMMUNITY): Payer: Self-pay | Admitting: *Deleted

## 2015-07-06 ENCOUNTER — Emergency Department (HOSPITAL_COMMUNITY)
Admission: EM | Admit: 2015-07-06 | Discharge: 2015-07-06 | Disposition: A | Discharge status: Home / Self Care | Payer: PPO | Attending: Emergency Medicine | Admitting: Emergency Medicine

## 2015-07-06 DIAGNOSIS — I509 Heart failure, unspecified: Secondary | ICD-10-CM | POA: Insufficient documentation

## 2015-07-06 DIAGNOSIS — Z8551 Personal history of malignant neoplasm of bladder: Secondary | ICD-10-CM | POA: Insufficient documentation

## 2015-07-06 DIAGNOSIS — Z7982 Long term (current) use of aspirin: Secondary | ICD-10-CM | POA: Diagnosis not present

## 2015-07-06 DIAGNOSIS — Y92017 Garden or yard in single-family (private) house as the place of occurrence of the external cause: Secondary | ICD-10-CM | POA: Diagnosis not present

## 2015-07-06 DIAGNOSIS — Y9389 Activity, other specified: Secondary | ICD-10-CM | POA: Insufficient documentation

## 2015-07-06 DIAGNOSIS — X58XXXA Exposure to other specified factors, initial encounter: Secondary | ICD-10-CM | POA: Diagnosis not present

## 2015-07-06 DIAGNOSIS — Z87891 Personal history of nicotine dependence: Secondary | ICD-10-CM | POA: Diagnosis not present

## 2015-07-06 DIAGNOSIS — I252 Old myocardial infarction: Secondary | ICD-10-CM | POA: Insufficient documentation

## 2015-07-06 DIAGNOSIS — J45909 Unspecified asthma, uncomplicated: Secondary | ICD-10-CM | POA: Insufficient documentation

## 2015-07-06 DIAGNOSIS — Y999 Unspecified external cause status: Secondary | ICD-10-CM | POA: Diagnosis not present

## 2015-07-06 DIAGNOSIS — S3992XA Unspecified injury of lower back, initial encounter: Secondary | ICD-10-CM | POA: Diagnosis not present

## 2015-07-06 DIAGNOSIS — S39012A Strain of muscle, fascia and tendon of lower back, initial encounter: Secondary | ICD-10-CM | POA: Diagnosis not present

## 2015-07-06 DIAGNOSIS — M1990 Unspecified osteoarthritis, unspecified site: Secondary | ICD-10-CM | POA: Insufficient documentation

## 2015-07-06 DIAGNOSIS — I4891 Unspecified atrial fibrillation: Secondary | ICD-10-CM | POA: Diagnosis not present

## 2015-07-06 DIAGNOSIS — E785 Hyperlipidemia, unspecified: Secondary | ICD-10-CM | POA: Insufficient documentation

## 2015-07-06 DIAGNOSIS — I2510 Atherosclerotic heart disease of native coronary artery without angina pectoris: Secondary | ICD-10-CM | POA: Insufficient documentation

## 2015-07-06 DIAGNOSIS — F329 Major depressive disorder, single episode, unspecified: Secondary | ICD-10-CM | POA: Insufficient documentation

## 2015-07-06 DIAGNOSIS — I110 Hypertensive heart disease with heart failure: Secondary | ICD-10-CM | POA: Insufficient documentation

## 2015-07-06 MED ORDER — TRAMADOL HCL 50 MG PO TABS: 50.0000 mg | ORAL_TABLET | Freq: Four times a day (QID) | ORAL | Status: DC | PRN

## 2015-07-06 MED ORDER — IBUPROFEN 800 MG PO TABS: 800.0000 mg | ORAL_TABLET | Freq: Once | ORAL | Status: DC

## 2015-07-06 MED ORDER — ORPHENADRINE CITRATE ER 100 MG PO TB12: 100.0000 mg | ORAL_TABLET | Freq: Two times a day (BID) | ORAL | Status: DC

## 2015-07-06 MED ORDER — NAPROXEN 375 MG PO TABS: 375.0000 mg | ORAL_TABLET | Freq: Two times a day (BID) | ORAL | Status: DC

## 2015-07-06 NOTE — ED Notes (Signed)
Pt c/o lower back pain x 2 days

## 2015-07-06 NOTE — ED Provider Notes (Signed)
CSN: [id number]     Arrival date & time 07/06/15  0518 History   First MD Initiated Contact with Patient 07/06/15 [phone]     Chief Complaint  Patient presents with  . Back Pain     (Consider location/radiation/quality/duration/timing/severity/associated sxs/prior Treatment) Patient is a 75 y.o. male presenting with back pain. The history is provided by the patient.  Back Pain He is complaining of pain across his lower back for the last 2 days. Pain is dull and achy and he rates it at 9/10. It was worse this morning when he tried to get out of bed. Since then, pain has subsided to 6/10. There is no associated weakness, numbness, tingling. There is no radiation of pain. He denies bowel or bladder dysfunction. He had been doing a lot of work in his garden including taking post holes and doing a lot of planting. He has had muscular pains in his back in the past, but this is worse.  Past Medical History  Diagnosis Date  . Coronary atherosclerosis of native coronary artery     Multivessel s/p CABG in 06/2001 post-IMI, LVEF 35% up to 50% postoperatively;  . Essential hypertension, benign   . Hyperlipidemia   . History of bipolar disorder   . Allergic rhinitis   . Erectile dysfunction   . Atrial fibrillation ([place])     Remote history - WARCEF  . Bipolar 1 disorder ([place])   . Depression   . Myocardial infarction ([place]) 2002  . CHF (congestive heart failure) ([place]) ?2002  . Asthma     AS CHILD - NO RECENT PROBLEMS  . Arthritis   . IBS (irritable bowel syndrome)   . Difficulty sleeping   . Frequency of urination   . BPH (benign prostatic hyperplasia)   . Cancer [facility])     BLADDER CANCER   Past Surgical History  Procedure Laterality Date  . Tonsillectomy    . Circumcision  10/2003  . Colonoscopy N/A 07/11/2012    Procedure: COLONOSCOPY;  Surgeon: Rogene Houston, MD;  Location: [facility];  Service: Endoscopy;  Laterality: N/A;  830-moved to 930 Ann to notify pt  . Cataract extraction  w/phaco Left 12/29/2014    Procedure: CATARACT EXTRACTION PHACO AND INTRAOCULAR LENS PLACEMENT (IOC);  Surgeon: Williams Che, MD;  Location: [facility];  Service: Ophthalmology;  Laterality: Left;  CDE:3.71  . Transurethral resection of prostate N/A 01/26/2015    Procedure: TRANSURETHRAL RESECTION OF THE PROSTATE WITH GYRUS INSTRUMENTS;  Surgeon: Cleon Gustin, MD;  Location: [facility];  Service: Urology;  Laterality: N/A;  . Transurethral resection of bladder tumor N/A 01/26/2015    Procedure: TRANSURETHRAL RESECTION OF BLADDER TUMOR (TURBT);  Surgeon: Cleon Gustin, MD;  Location: [facility];  Service: Urology;  Laterality: N/A;  . Coronary artery bypass graft  08/2000    Dr. Servando Snare - LIMA to LAD, SVG to diagonal, SVG to PDA TRIPLE BYPASS  . Transurethral resection of bladder tumor with gyrus (turbt-gyrus) N/A 03/02/2015    Procedure: TRANSURETHRAL RESECTION OF BLADDER TUMOR WITH GYRUS (TURBT-GYRUS);  Surgeon: Cleon Gustin, MD;  Location: [facility];  Service: Urology;  Laterality: N/A;  . Cystoscopy w/ retrogrades Bilateral 03/02/2015    Procedure: CYSTOSCOPY WITH RIGHT RETROGRADE PYELOGRAM,  ATTEMPTED LEFT RETROGRADE PYELOGRAM;  Surgeon: Cleon Gustin, MD;  Location: [facility];  Service: Urology;  Laterality: Bilateral;  . Cataract extraction w/phaco Right 03/30/2015    Procedure: CATARACT EXTRACTION PHACO AND INTRAOCULAR LENS PLACEMENT RIGHT EYE CDE=2.56;  Surgeon: Williams Che, MD;  Location: [facility];  Service: Ophthalmology;  Laterality: Right;   Family History  Problem Relation Age of Onset  . Asthma Mother   . Heart attack Father 30  . Diabetes Brother    Social History  Substance Use Topics  . Smoking status: Former Smoker -- 1.00 packs/day for 15 years    Types: Cigarettes    Quit date: 01/11/1971  . Smokeless tobacco: None  . Alcohol Use: No    Review of Systems  Musculoskeletal: Positive for back pain.  All other systems reviewed and are  negative.     Allergies  Review of patient's allergies indicates no known allergies.  Home Medications   Prior to Admission medications   Medication Sig Start Date End Date Taking? Authorizing Provider  aspirin EC 81 MG tablet Take 81 mg by mouth daily.    Historical Provider, MD  atorvastatin (LIPITOR) 80 MG tablet Take 80 mg by mouth at bedtime.      Historical Provider, MD  carbamazepine (TEGRETOL) 200 MG tablet Take 400 mg by mouth 2 (two) times daily.     Historical Provider, MD  cephALEXin (KEFLEX) 500 MG capsule Take 1 capsule (500 mg total) by mouth 4 (four) times daily. Patient not taking: Reported on 03/17/2015 02/20/15   Francine Graven, DO  divalproex (DEPAKOTE) 250 MG DR tablet Take 250 mg by mouth 3 (three) times daily.    Historical Provider, MD  furosemide (LASIX) 40 MG tablet Take 2 tablets (80 mg total) by mouth daily as needed for fluid. Patient taking differently: Take 40 mg by mouth daily. *Take an additional tablet if you gain 3lbs or more 01/19/15   Rolland Porter, MD  lisinopril (PRINIVIL,ZESTRIL) 5 MG tablet TAKE ONE TABLET BY MOUTH ONCE DAILY. 06/12/15   [name], MD  Probiotic Product (DIGESTIVE ADVANTAGE GUMMIES PO) Take 2 tablets by mouth daily.    Historical Provider, MD   BP 135/67 mmHg  Pulse 84  Temp(Src) 98.2 F (36.8 C) (Oral)  Resp 18  Ht 6' (1.829 m)  Wt 232 lb (105.235 kg)  BMI 31.46 kg/m2  SpO2 100% Physical Exam  Nursing note and vitals reviewed.  75 year old male, resting comfortably and in no acute distress. Vital signs are normal. Oxygen saturation is 100%, which is normal. Head is normocephalic and atraumatic. PERRLA, EOMI. Oropharynx is clear. Neck is nontender and supple without adenopathy or JVD. Back is mildly tender over the mid and lower lumbar spine. There is moderate right paralumbar spasm. Straight leg raise is positive bilaterally at 45. There is no CVA tenderness. Lungs are clear without rales, wheezes, or rhonchi. Chest is  nontender. Heart has regular rate and rhythm without murmur. Abdomen is soft, flat, nontender without masses or hepatosplenomegaly and peristalsis is normoactive. Extremities have no cyanosis or edema, full range of motion is present. Skin is warm and dry without rash. Neurologic: Mental status is normal, cranial nerves are intact, there are no motor or sensory deficits.  ED Course  Procedures (including critical care time)   MDM   Final diagnoses:  Lumbar strain, initial encounter    Musculoskeletal low back pain. Old records are reviewed and I can find no relevant visits. However, he has had CT scans which show no evidence of abdominal aortic aneurysm. No red flags to suggest more serious conditions. He is discharged with prescriptions for naproxen, orphenadrine, and tramadol and is referred back to his PCP.    Delora Fuel,  MD 07/06/15 [phone]

## 2015-07-06 NOTE — Discharge Instructions (Signed)
Lumbosacral Strain Lumbosacral strain is a strain of any of the parts that make up your lumbosacral vertebrae. Your lumbosacral vertebrae are the bones that make up the lower third of your backbone. Your lumbosacral vertebrae are held together by muscles and tough, fibrous tissue (ligaments).  CAUSES  A sudden blow to your back can cause lumbosacral strain. Also, anything that causes an excessive stretch of the muscles in the low back can cause this strain. This is typically seen when people exert themselves strenuously, fall, lift heavy objects, bend, or crouch repeatedly. RISK FACTORS  Physically demanding work.  Participation in pushing or pulling sports or sports that require a sudden twist of the back (tennis, golf, baseball).  Weight lifting.  Excessive lower back curvature.  Forward-tilted pelvis.  Weak back or abdominal muscles or both.  Tight hamstrings. SIGNS AND SYMPTOMS  Lumbosacral strain may cause pain in the area of your injury or pain that moves (radiates) down your leg.  DIAGNOSIS Your health care provider can often diagnose lumbosacral strain through a physical exam. In some cases, you may need tests such as X-ray exams.  TREATMENT  Treatment for your lower back injury depends on many factors that your clinician will have to evaluate. However, most treatment will include the use of anti-inflammatory medicines. HOME CARE INSTRUCTIONS   Avoid hard physical activities (tennis, racquetball, waterskiing) if you are not in proper physical condition for it. This may aggravate or create problems.  If you have a back problem, avoid sports requiring sudden body movements. Swimming and walking are generally safer activities.  Maintain good posture.  Maintain a healthy weight.  For acute conditions, you may put ice on the injured area.  Put ice in a plastic bag.  Place a towel between your skin and the bag.  Leave the ice on for 20 minutes, 2-3 times a day.  When the  low back starts healing, stretching and strengthening exercises may be recommended. SEEK MEDICAL CARE IF:  Your back pain is getting worse.  You experience severe back pain not relieved with medicines. SEEK IMMEDIATE MEDICAL CARE IF:   You have numbness, tingling, weakness, or problems with the use of your arms or legs.  There is a change in bowel or bladder control.  You have increasing pain in any area of the body, including your belly (abdomen).  You notice shortness of breath, dizziness, or feel faint.  You feel sick to your stomach (nauseous), are throwing up (vomiting), or become sweaty.  You notice discoloration of your toes or legs, or your feet get very cold. MAKE SURE YOU:   Understand these instructions.  Will watch your condition.  Will get help right away if you are not doing well or get worse.   This information is not intended to replace advice given to you by your health care provider. Make sure you discuss any questions you have with your health care provider.   Document Released: 10/06/2004 Document Revised: 01/17/2014 Document Reviewed: 08/15/2012 Elsevier Interactive Patient Education 2016 Elsevier Inc.  Naproxen and naproxen sodium oral immediate-release tablets What is this medicine? NAPROXEN (na PROX en) is a non-steroidal anti-inflammatory drug (NSAID). It is used to reduce swelling and to treat pain. This medicine may be used for dental pain, headache, or painful monthly periods. It is also used for painful joint and muscular problems such as arthritis, tendinitis, bursitis, and gout. This medicine may be used for other purposes; ask your health care provider or pharmacist if you have questions.  What should I tell my health care provider before I take this medicine? They need to know if you have any of these conditions: -asthma -cigarette smoker -drink more than 3 alcohol containing drinks a day -heart disease or circulation problems such as heart  failure or leg edema (fluid retention) -high blood pressure -kidney disease -liver disease -stomach bleeding or ulcers -an unusual or allergic reaction to naproxen, aspirin, other NSAIDs, other medicines, foods, dyes, or preservatives -pregnant or trying to get pregnant -breast-feeding How should I use this medicine? Take this medicine by mouth with a glass of water. Follow the directions on the prescription label. Take it with food if your stomach gets upset. Try to not lie down for at least 10 minutes after you take it. Take your medicine at regular intervals. Do not take your medicine more often than directed. Long-term, continuous use may increase the risk of heart attack or stroke. A special MedGuide will be given to you by the pharmacist with each prescription and refill. Be sure to read this information carefully each time. Talk to your pediatrician regarding the use of this medicine in children. Special care may be needed. Overdosage: If you think you have taken too much of this medicine contact a poison control center or emergency room at once. NOTE: This medicine is only for you. Do not share this medicine with others. What if I miss a dose? If you miss a dose, take it as soon as you can. If it is almost time for your next dose, take only that dose. Do not take double or extra doses. What may interact with this medicine? -alcohol -aspirin -cidofovir -diuretics -lithium -methotrexate -other drugs for inflammation like ketorolac or prednisone -pemetrexed -probenecid -warfarin This list may not describe all possible interactions. Give your health care provider a list of all the medicines, herbs, non-prescription drugs, or dietary supplements you use. Also tell them if you smoke, drink alcohol, or use illegal drugs. Some items may interact with your medicine. What should I watch for while using this medicine? Tell your doctor or health care professional if your pain does not get  better. Talk to your doctor before taking another medicine for pain. Do not treat yourself. This medicine does not prevent heart attack or stroke. In fact, this medicine may increase the chance of a heart attack or stroke. The chance may increase with longer use of this medicine and in people who have heart disease. If you take aspirin to prevent heart attack or stroke, talk with your doctor or health care professional. Do not take other medicines that contain aspirin, ibuprofen, or naproxen with this medicine. Side effects such as stomach upset, nausea, or ulcers may be more likely to occur. Many medicines available without a prescription should not be taken with this medicine. This medicine can cause ulcers and bleeding in the stomach and intestines at any time during treatment. Do not smoke cigarettes or drink alcohol. These increase irritation to your stomach and can make it more susceptible to damage from this medicine. Ulcers and bleeding can happen without warning symptoms and can cause death. You may get drowsy or dizzy. Do not drive, use machinery, or do anything that needs mental alertness until you know how this medicine affects you. Do not stand or sit up quickly, especially if you are an older patient. This reduces the risk of dizzy or fainting spells. This medicine can cause you to bleed more easily. Try to avoid damage to your teeth and  gums when you brush or floss your teeth. What side effects may I notice from receiving this medicine? Side effects that you should report to your doctor or health care professional as soon as possible: -black or bloody stools, blood in the urine or vomit -blurred vision -chest pain -difficulty breathing or wheezing -nausea or vomiting -severe stomach pain -skin rash, skin redness, blistering or peeling skin, hives, or itching -slurred speech or weakness on one side of the body -swelling of eyelids, throat, lips -unexplained weight gain or  swelling -unusually weak or tired -yellowing of eyes or skin Side effects that usually do not require medical attention (report to your doctor or health care professional if they continue or are bothersome): -constipation -headache -heartburn This list may not describe all possible side effects. Call your doctor for medical advice about side effects. You may report side effects to FDA at 1-800-FDA-1088. Where should I keep my medicine? Keep out of the reach of children. Store at room temperature between 15 and 30 degrees C (59 and 86 degrees F). Keep container tightly closed. Throw away any unused medicine after the expiration date. NOTE: This sheet is a summary. It may not cover all possible information. If you have questions about this medicine, talk to your doctor, pharmacist, or health care provider.    2016, Elsevier/Gold Standard. (2008-12-29 20:10:16)  Orphenadrine tablets What is this medicine? ORPHENADRINE (or FEN a dreen) helps to relieve pain and stiffness in muscles and can treat muscle spasms. This medicine may be used for other purposes; ask your health care provider or pharmacist if you have questions. What should I tell my health care provider before I take this medicine? They need to know if you have any of these conditions: -glaucoma -heart disease -kidney disease -myasthenia gravis -peptic ulcer disease -prostate disease -stomach problems -an unusual or allergic reaction to orphenadrine, other medicines, foods, lactose, dyes, or preservatives -pregnant or trying to get pregnant -breast-feeding How should I use this medicine? Take this medicine by mouth with a full glass of water. Follow the directions on the prescription label. Take your medicine at regular intervals. Do not take your medicine more often than directed. Do not take more than you are told to take. Talk to your pediatrician regarding the use of this medicine in children. Special care may be  needed. Patients over 41 years old may have a stronger reaction and need a smaller dose. Overdosage: If you think you have taken too much of this medicine contact a poison control center or emergency room at once. NOTE: This medicine is only for you. Do not share this medicine with others. What if I miss a dose? If you miss a dose, take it as soon as you can. If it is almost time for your next dose, take only that dose. Do not take double or extra doses. What may interact with this medicine? -alcohol -antihistamines -barbiturates, like phenobarbital -benzodiazepines -cyclobenzaprine -medicines for pain -phenothiazines like chlorpromazine, mesoridazine, prochlorperazine, thioridazine This list may not describe all possible interactions. Give your health care provider a list of all the medicines, herbs, non-prescription drugs, or dietary supplements you use. Also tell them if you smoke, drink alcohol, or use illegal drugs. Some items may interact with your medicine. What should I watch for while using this medicine? Your mouth may get dry. Chewing sugarless gum or sucking hard candy, and drinking plenty of water may help. Contact your doctor if the problem does not go away or is severe. This  medicine may cause dry eyes and blurred vision. If you wear contact lenses you may feel some discomfort. Lubricating drops may help. See your eye doctor if the problem does not go away or is severe. You may get drowsy or dizzy. Do not drive, use machinery, or do anything that needs mental alertness until you know how this medicine affects you. Do not stand or sit up quickly, especially if you are an older patient. This reduces the risk of dizzy or fainting spells. Alcohol may interfere with the effect of this medicine. Avoid alcoholic drinks. What side effects may I notice from receiving this medicine? Side effects that you should report to your doctor or health care professional as soon as possible: -allergic  reactions like skin rash, itching or hives, swelling of the face, lips, or tongue -changes in vision -difficulty breathing -fast heartbeat or palpitations -hallucinations -light headedness, fainting spells -vomiting Side effects that usually do not require medical attention (report to your doctor or health care professional if they continue or are bothersome): -dizziness -drowsiness -headache -nausea This list may not describe all possible side effects. Call your doctor for medical advice about side effects. You may report side effects to FDA at 1-800-FDA-1088. Where should I keep my medicine? Keep out of the reach of children. This medicine may cause accidental overdose and death if it taken by other adults, children, or pets. Mix any unused medicine with a substance like cat litter or coffee grounds. Then throw the medicine away in a sealed container like a sealed bag or a coffee can with a lid. Do not use the medicine after the expiration date. Store at room temperature between 15 and 30 degrees C (59 and 86 degrees F). NOTE: This sheet is a summary. It may not cover all possible information. If you have questions about this medicine, talk to your doctor, pharmacist, or health care provider.    2016, Elsevier/Gold Standard. (2013-02-22 15:35:08)  Tramadol tablets What is this medicine? TRAMADOL (TRA ma dole) is a pain reliever. It is used to treat moderate to severe pain in adults. This medicine may be used for other purposes; ask your health care provider or pharmacist if you have questions. What should I tell my health care provider before I take this medicine? They need to know if you have any of these conditions: -brain tumor -depression -drug abuse or addiction -head injury -if you frequently drink alcohol containing drinks -kidney disease or trouble passing urine -liver disease -lung disease, asthma, or breathing problems -seizures or epilepsy -suicidal thoughts, plans,  or attempt; a previous suicide attempt by you or a family member -an unusual or allergic reaction to tramadol, codeine, other medicines, foods, dyes, or preservatives -pregnant or trying to get pregnant -breast-feeding How should I use this medicine? Take this medicine by mouth with a full glass of water. Follow the directions on the prescription label. If the medicine upsets your stomach, take it with food or milk. Do not take more medicine than you are told to take. Talk to your pediatrician regarding the use of this medicine in children. Special care may be needed. Overdosage: If you think you have taken too much of this medicine contact a poison control center or emergency room at once. NOTE: This medicine is only for you. Do not share this medicine with others. What if I miss a dose? If you miss a dose, take it as soon as you can. If it is almost time for your next dose, take  only that dose. Do not take double or extra doses. What may interact with this medicine? Do not take this medicine with any of the following medications: -MAOIs like Carbex, Eldepryl, Marplan, Nardil, and Parnate This medicine may also interact with the following medications: -alcohol or medicines that contain alcohol -antihistamines -benzodiazepines -bupropion -carbamazepine or oxcarbazepine -clozapine -cyclobenzaprine -digoxin -furazolidone -linezolid -medicines for depression, anxiety, or psychotic disturbances -medicines for migraine headache like almotriptan, eletriptan, frovatriptan, naratriptan, rizatriptan, sumatriptan, zolmitriptan -medicines for pain like pentazocine, buprenorphine, butorphanol, meperidine, nalbuphine, and propoxyphene -medicines for sleep -muscle relaxants -naltrexone -phenobarbital -phenothiazines like perphenazine, thioridazine, chlorpromazine, mesoridazine, fluphenazine, prochlorperazine, promazine, and trifluoperazine -procarbazine -warfarin This list may not describe all  possible interactions. Give your health care provider a list of all the medicines, herbs, non-prescription drugs, or dietary supplements you use. Also tell them if you smoke, drink alcohol, or use illegal drugs. Some items may interact with your medicine. What should I watch for while using this medicine? Tell your doctor or health care professional if your pain does not go away, if it gets worse, or if you have new or a different type of pain. You may develop tolerance to the medicine. Tolerance means that you will need a higher dose of the medicine for pain relief. Tolerance is normal and is expected if you take this medicine for a long time. Do not suddenly stop taking your medicine because you may develop a severe reaction. Your body becomes used to the medicine. This does NOT mean you are addicted. Addiction is a behavior related to getting and using a drug for a non-medical reason. If you have pain, you have a medical reason to take pain medicine. Your doctor will tell you how much medicine to take. If your doctor wants you to stop the medicine, the dose will be slowly lowered over time to avoid any side effects. You may get drowsy or dizzy. Do not drive, use machinery, or do anything that needs mental alertness until you know how this medicine affects you. Do not stand or sit up quickly, especially if you are an older patient. This reduces the risk of dizzy or fainting spells. Alcohol can increase or decrease the effects of this medicine. Avoid alcoholic drinks. You may have constipation. Try to have a bowel movement at least every 2 to 3 days. If you do not have a bowel movement for 3 days, call your doctor or health care professional. Your mouth may get dry. Chewing sugarless gum or sucking hard candy, and drinking plenty of water may help. Contact your doctor if the problem does not go away or is severe. What side effects may I notice from receiving this medicine? Side effects that you should  report to your doctor or health care professional as soon as possible: -allergic reactions like skin rash, itching or hives, swelling of the face, lips, or tongue -breathing difficulties, wheezing -confusion -itching -light headedness or fainting spells -redness, blistering, peeling or loosening of the skin, including inside the mouth -seizures Side effects that usually do not require medical attention (report to your doctor or health care professional if they continue or are bothersome): -constipation -dizziness -drowsiness -headache -nausea, vomiting This list may not describe all possible side effects. Call your doctor for medical advice about side effects. You may report side effects to FDA at 1-800-FDA-1088. Where should I keep my medicine? Keep out of the reach of children. This medicine may cause accidental overdose and death if it taken by other adults, children,  or pets. Mix any unused medicine with a substance like cat litter or coffee grounds. Then throw the medicine away in a sealed container like a sealed bag or a coffee can with a lid. Do not use the medicine after the expiration date. Store at room temperature between 15 and 30 degrees C (59 and 86 degrees F). NOTE: This sheet is a summary. It may not cover all possible information. If you have questions about this medicine, talk to your doctor, pharmacist, or health care provider.    2016, Elsevier/Gold Standard. (2013-02-22 15:42:09)

## 2015-07-08 DIAGNOSIS — H2511 Age-related nuclear cataract, right eye: Secondary | ICD-10-CM | POA: Diagnosis not present

## 2015-07-21 DIAGNOSIS — I2510 Atherosclerotic heart disease of native coronary artery without angina pectoris: Secondary | ICD-10-CM | POA: Diagnosis not present

## 2015-07-21 DIAGNOSIS — I509 Heart failure, unspecified: Secondary | ICD-10-CM | POA: Diagnosis not present

## 2015-07-21 DIAGNOSIS — Z79899 Other long term (current) drug therapy: Secondary | ICD-10-CM | POA: Diagnosis not present

## 2015-07-28 DIAGNOSIS — F319 Bipolar disorder, unspecified: Secondary | ICD-10-CM | POA: Diagnosis not present

## 2015-07-28 DIAGNOSIS — E875 Hyperkalemia: Secondary | ICD-10-CM | POA: Diagnosis not present

## 2015-07-28 DIAGNOSIS — I5022 Chronic systolic (congestive) heart failure: Secondary | ICD-10-CM | POA: Diagnosis not present

## 2015-08-03 DIAGNOSIS — F3110 Bipolar disorder, current episode manic without psychotic features, unspecified: Secondary | ICD-10-CM | POA: Diagnosis not present

## 2015-08-21 DIAGNOSIS — C672 Malignant neoplasm of lateral wall of bladder: Secondary | ICD-10-CM | POA: Diagnosis not present

## 2015-08-21 DIAGNOSIS — Z5111 Encounter for antineoplastic chemotherapy: Secondary | ICD-10-CM | POA: Diagnosis not present

## 2015-08-28 DIAGNOSIS — C672 Malignant neoplasm of lateral wall of bladder: Secondary | ICD-10-CM | POA: Diagnosis not present

## 2015-08-28 DIAGNOSIS — Z5111 Encounter for antineoplastic chemotherapy: Secondary | ICD-10-CM | POA: Diagnosis not present

## 2015-09-04 ENCOUNTER — Other Ambulatory Visit: Payer: Self-pay

## 2015-09-04 DIAGNOSIS — C672 Malignant neoplasm of lateral wall of bladder: Secondary | ICD-10-CM | POA: Diagnosis not present

## 2015-09-04 DIAGNOSIS — Z5111 Encounter for antineoplastic chemotherapy: Secondary | ICD-10-CM | POA: Diagnosis not present

## 2015-09-04 NOTE — Patient Outreach (Signed)
[facility]) Care Management  09/04/2015  HAYATO LEVI [birth year] [id number]   Telephone call to patient for screen. No answer.  HIPAA compliant voice message left.  Plan: RN Health Coach will attempt patient again in the month of August.   Rayan Dyal J Aleyah Balik, RN, MSN [place] [phone]

## 2015-09-07 ENCOUNTER — Other Ambulatory Visit: Payer: Self-pay

## 2015-09-07 NOTE — Patient Outreach (Signed)
[facility]) Care Management  09/07/2015  Grant Stevens Oct 26, 1940 [id number]   2nd telephone call to patient for screening call. No answer.  HIPAA compliant voice message left.   Plan: RN Health Coach will attempt patient again the month of September.    Jone Baseman, RN, MSN [place] [phone]

## 2015-09-15 ENCOUNTER — Other Ambulatory Visit: Payer: Self-pay

## 2015-09-15 DIAGNOSIS — I10 Essential (primary) hypertension: Secondary | ICD-10-CM

## 2015-09-15 NOTE — Patient Outreach (Signed)
Grant Stevens [facility]) Care Management  09/15/2015  Grant Stevens 1940/06/07 [id number]   Referral Date: 08-28-15 Referral Source: [employer] Referral Reason: [employer] concierge Outreach Attempt: Third Attempt Successful  Social: Patient reports he lives alone Patient is independent with all aspects of care.  Patient is an avid gardener.  Patient still drives.  He is a Psychologist, occupational at [employer].    Conditions: Patient admits to HTN, and history of cancer.  Patient interested education and support for HTN.   Medications: Patient manages his medications and has no questions about his medications.  Patient reports he is able to afford his medications.   Services: Patient interested in education and support for hypertension.    Plan: RN Health Coach will refer to Telephonic Care Coordinator for disease management for hypertension.  Grant Baseman, RN, MSN [place] [phone]

## 2015-09-17 ENCOUNTER — Other Ambulatory Visit: Payer: Self-pay | Admitting: *Deleted

## 2015-09-18 ENCOUNTER — Other Ambulatory Visit: Payer: Self-pay | Admitting: *Deleted

## 2015-09-18 NOTE — Patient Outreach (Signed)
[facility]) Care Management  09/18/2015  Grant Stevens April 21, 1940 [id number]  Telephone call attempt to patient; left message on home phone requesting call back. Call to cell phone. Message states -not available; unable to leave message.  Plan: Will follow up. Sherrin Daisy, RN BSN [place] Management Coordinator [facility] Care Management  [phone]

## 2015-09-21 ENCOUNTER — Encounter: Payer: Self-pay | Admitting: *Deleted

## 2015-09-21 ENCOUNTER — Other Ambulatory Visit: Payer: Self-pay | Admitting: *Deleted

## 2015-09-21 NOTE — Patient Outreach (Signed)
[facility]) Care Management  09/21/2015  ZENNON FREET 14-Jul-1940 [id number]  Received voice message from patient who was responding to RN case manager's voice mail. Telephone return call to patient; left message on voice mail requesting call back.  Received return call from patient. Patient advised of reason for call and of [facility] care management services. Patient interested in learning more of about hypertension. Agrees to [facility] disease management program.  Agrees to health assessments. See assessments as noted.   Plan: Complete assessments.  Formulate Care Plan with patient. Send educational material to patient. Send consent form to patient to be signed and returned to [facility]. Send involvement letter to MD. Follow up with patient to complete health assessments. Patient agrees to set appointment.   Sherrin Daisy, RN BSN [place] Management Coordinator [facility] Care Management  [phone]

## 2015-09-22 ENCOUNTER — Other Ambulatory Visit: Payer: Self-pay | Admitting: *Deleted

## 2015-09-22 ENCOUNTER — Encounter: Payer: Self-pay | Admitting: *Deleted

## 2015-09-22 NOTE — Patient Outreach (Signed)
[facility]) Care Management  [place]  09/22/2015   Grant Stevens 06/02/1940 [id number]  Subjective:  Telephone call to patient to continue health assessments. Patient gave HIPPA verification.  Patient wishes to participate in disease management program for chronic disease of hypertension. .  Objective: Per record review patient has diagnosis of Essential Hypertension.   Encounter Medications:  Outpatient Encounter Prescriptions as of 09/22/2015  Medication Sig  . aspirin EC 81 MG tablet Take 81 mg by mouth daily.  Marland Kitchen atorvastatin (LIPITOR) 80 MG tablet Take 80 mg by mouth at bedtime.    . carbamazepine (TEGRETOL) 200 MG tablet Take 400 mg by mouth 2 (two) times daily.   . divalproex (DEPAKOTE) 250 MG DR tablet Take 250 mg by mouth 3 (three) times daily.  . furosemide (LASIX) 40 MG tablet Take 2 tablets (80 mg total) by mouth daily as needed for fluid. (Patient taking differently: Take 40 mg by mouth daily. *Take an additional tablet if you gain 3lbs or more)  . lisinopril (PRINIVIL,ZESTRIL) 5 MG tablet TAKE ONE TABLET BY MOUTH ONCE DAILY.  . naproxen (NAPROSYN) 375 MG tablet Take 1 tablet (375 mg total) by mouth 2 (two) times daily. (Patient not taking: Reported on 09/21/2015)  . orphenadrine (NORFLEX) 100 MG tablet Take 1 tablet (100 mg total) by mouth 2 (two) times daily. (Patient not taking: Reported on 09/21/2015)  . Probiotic Product (DIGESTIVE ADVANTAGE GUMMIES PO) Take 2 tablets by mouth daily.  . temazepam (RESTORIL) 15 MG capsule Take 15 mg by mouth at bedtime as needed for sleep.  . traMADol (ULTRAM) 50 MG tablet Take 1 tablet (50 mg total) by mouth every 6 (six) hours as needed. (Patient not taking: Reported on 09/21/2015)   No facility-administered encounter medications on file as of 09/22/2015.     Functional Status:  In your present state of health, do you have any difficulty performing the following activities: 09/21/2015 02/26/2015  Hearing? N N   Vision? N N  Difficulty concentrating or making decisions? N N  Walking or climbing stairs? N N  Dressing or bathing? N N  Doing errands, shopping? N N  Preparing Food and eating ? N -  Using the Toilet? N -  In the past six months, have you accidently leaked urine? N -  Do you have problems with loss of bowel control? N -  Managing your Medications? N -  Managing your Finances? N -  Housekeeping or managing your Housekeeping? N -  Some recent data might be hidden    Fall/Depression Screening: PHQ 2/9 Scores 09/21/2015 09/15/2015  PHQ - 2 Score 0 0    Assessment:  See health assessments as noted. Patient unable to complete. Wishes to call RN CM back tomorrow to complete.  Plan:  Will follow up with patient to complete health assessments and formulate Care Plan.  Sherrin Daisy, RN BSN [place] Management Coordinator [facility] Care Management  [phone]

## 2015-09-23 ENCOUNTER — Other Ambulatory Visit: Payer: Self-pay | Admitting: *Deleted

## 2015-09-23 ENCOUNTER — Encounter: Payer: Self-pay | Admitting: *Deleted

## 2015-09-23 NOTE — Patient Outreach (Signed)
[facility]) Care Management  [place]  09/23/2015   Grant Stevens [birth year] [id number]   Return call to patient who gave HIPPA verification .  Subjective:  Patient voices that he wants to participate in disease management program for chronic condition of Hypertension.   States last recorded blood pressure was 110/75.   Voices that he manages own medications and takes consistently every day.   Objective:   See health assessments, medication list & Care Plan as noted.   Encounter Medications:  Outpatient Encounter Prescriptions as of 09/23/2015  Medication Sig  . aspirin EC 81 MG tablet Take 81 mg by mouth daily.  Marland Kitchen atorvastatin (LIPITOR) 80 MG tablet Take 80 mg by mouth at bedtime.    . carbamazepine (TEGRETOL) 200 MG tablet Take 400 mg by mouth 2 (two) times daily.   . divalproex (DEPAKOTE) 250 MG DR tablet Take 250 mg by mouth 3 (three) times daily.  . furosemide (LASIX) 40 MG tablet Take 2 tablets (80 mg total) by mouth daily as needed for fluid. (Patient taking differently: Take 40 mg by mouth daily. *Take an additional tablet if you gain 3lbs or more)  . lisinopril (PRINIVIL,ZESTRIL) 5 MG tablet TAKE ONE TABLET BY MOUTH ONCE DAILY.  . naproxen (NAPROSYN) 375 MG tablet Take 1 tablet (375 mg total) by mouth 2 (two) times daily. (Patient not taking: Reported on 09/21/2015)  . orphenadrine (NORFLEX) 100 MG tablet Take 1 tablet (100 mg total) by mouth 2 (two) times daily. (Patient not taking: Reported on 09/21/2015)  . Probiotic Product (DIGESTIVE ADVANTAGE GUMMIES PO) Take 2 tablets by mouth daily.  . temazepam (RESTORIL) 15 MG capsule Take 15 mg by mouth at bedtime as needed for sleep.  . traMADol (ULTRAM) 50 MG tablet Take 1 tablet (50 mg total) by mouth every 6 (six) hours as needed. (Patient not taking: Reported on 09/21/2015)   No facility-administered encounter medications on file as of 09/23/2015.     Functional Status:  In your present state of  health, do you have any difficulty performing the following activities: 09/21/2015 02/26/2015  Hearing? N N  Vision? N N  Difficulty concentrating or making decisions? N N  Walking or climbing stairs? N N  Dressing or bathing? N N  Doing errands, shopping? N N  Preparing Food and eating ? N -  Using the Toilet? N -  In the past six months, have you accidently leaked urine? N -  Do you have problems with loss of bowel control? N -  Managing your Medications? N -  Managing your Finances? N -  Housekeeping or managing your Housekeeping? N -  Some recent data might be hidden    Fall/Depression Screening: PHQ 2/9 Scores 09/21/2015 09/15/2015  PHQ - 2 Score 0 0    Assessment:   Fall Risk  09/21/2015  Falls in the past year? No   Plan:  [facility] CM Care Plan Problem One   Flowsheet Row Most Recent Value  Care Plan Problem One  Knowledge deficit regarding Hypertension as noted by request for information regarding chronic condition  Role Documenting the Problem One  Care Management Telephonic Coordinator  Care Plan for Problem One  Active  THN Long Term Goal (31-90 days)  Pt will verbalize 3 signs of uncontrolled blood pressure, 3 strategies  to control  high blood pressure & 3 complications of uncontrolled blood pressure within 60 days  THN Long Term Goal Start Date  09/21/15  Interventions for Problem  One Long Term Goal  Verbal explanations & printed educational  to pt on HTN-signs & symptoms,,  strategies for controlled HTN,  complications of uncontrolled HTN  THN CM Short Term Goal #1 (0-30 days)  Will verbalize 3 possible signs of HTN in 30 days  THN CM Short Term Goal #1 Start Date  09/23/15  Interventions for Short Term Goal #1  Verbally advise & send written educational material - [Advise HTN is known as silent killer(some don't have sxs.]  THN CM Short Term Goal #2 (0-30 days)  Pt verbalizes 3 self management strategies of HTN control in 30 days  THN CM Short Term Goal #2 Start Date   09/23/15  Interventions for Short Term Goal #2  Verbally advise & send ed materials on med adherence, diet, exercise, MD follow up , take BP  THN CM Short Term Goal #3 (0-30 days)  Pt will verbalize 3 complications of uncontrolled  HTN  within  30 days.  THN CM Short Term Goal #3 Start Date  09/21/15  Interventions for Short Tern Goal #3  Verbal explanation of stroke, heart attack , kidney failure,  will send written material  to pt also      Plan: Will follow care plan as noted.  Patient  plans to review written materials that have been sent via mail. Verbal explanations of materials given today and patient voices understanding. Will follow up. Patient agrees with set appointment. Communications sent to MD.  Sherrin Daisy, RN BSN CCM Care Management Coordinator [facility] Care Management  [phone] .

## 2015-10-05 ENCOUNTER — Other Ambulatory Visit: Payer: Self-pay | Admitting: *Deleted

## 2015-10-05 NOTE — Patient Outreach (Signed)
[place] [facility]) Care Management  10/05/2015  Grant Stevens [birth year] [id number]  Telephone call to patient who gave HIPPA verification.  Patient voices that he has received educational material but has not finished reading all of materials.   States he had dental work today and had repair of tooth.   Voices he has been exercising and watching salt intake.   Verbal review of symptoms of high blood pressure & complications of uncontrolled blood pressure done with patient.  Strategies for controlling blood pressure reviewed with patient who voices understanding.    Plan: Continue to follow care plan as noted. Patient agrees with set appointment .  Sherrin Daisy, RN BSN CCM Care Management Coordinator [facility] Care Management  [phone] .lin

## 2015-10-20 ENCOUNTER — Other Ambulatory Visit: Payer: Self-pay | Admitting: *Deleted

## 2015-10-20 NOTE — Patient Outreach (Signed)
[facility] [facility]) Care Management  10/20/2015  BUCHANAN HOUSEN 1940-04-20 [id number]  Telephone call to patient. Left message on voice mail requesting return call,  Plan: will follow up next week,   Sherrin Daisy, [place] Management Coordinator [facility] Care Management  [phone]

## 2015-10-22 ENCOUNTER — Encounter: Payer: Self-pay | Admitting: Cardiology

## 2015-10-22 NOTE — Progress Notes (Signed)
Cardiology Office Note  Date: 10/23/2015   ID: Grant Stevens, DOB [birth year], MRN [id number]  PCP: [name], MD  Primary Cardiologist: Rozann Lesches, MD   Chief Complaint  Patient presents with  . Coronary Artery Disease    History of Present Illness: Grant Stevens is a 75 y.o. male last seen in October 2016. He presents for a routine follow-up visit. Reports no angina symptoms or nitroglycerin use. 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 Exercises at [facility] and also walks on a regular basis.  We went over his medications which are outlined below. He continues on aspirin, Lipitor, Lasix, and lisinopril. I reviewed his lab work from June. He just had a redraw lab work today with follow-up visit pending per Dr. Willey Blade.  Stress testing from within the last 2 years is outlined below. His LVEF is 35-40% range by echocardiography. He has been quite stable without heart failure symptoms, and has declined ICD.  Past Medical History:  Diagnosis Date  . Allergic rhinitis   . Arthritis   . Asthma    Childhood  . Atrial fibrillation ([place])    Remote history - WARCEF  . Bladder cancer ([place])   . BPH (benign prostatic hyperplasia)   . Cardiomyopathy ([place])   . Coronary atherosclerosis of native coronary artery    Multivessel s/p CABG in 06/2001 post-IMI, LVEF 35% up to 50% postoperatively;  . Depression   . Difficulty sleeping   . Erectile dysfunction   . Essential hypertension, benign   . Frequency of urination   . History of bipolar disorder   . Hyperlipidemia   . IBS (irritable bowel syndrome)   . Myocardial infarction 2002    Past Surgical History:  Procedure Laterality Date  . CATARACT EXTRACTION W/PHACO Left 12/29/2014   Procedure: CATARACT EXTRACTION PHACO AND INTRAOCULAR LENS PLACEMENT (IOC);  Surgeon: Williams Che, MD;  Location: [facility] ORS;  Service: Ophthalmology;  Laterality: Left;  CDE:3.71  . CATARACT EXTRACTION W/PHACO Right  03/30/2015   Procedure: CATARACT EXTRACTION PHACO AND INTRAOCULAR LENS PLACEMENT RIGHT EYE CDE=2.56;  Surgeon: Williams Che, MD;  Location: [facility] ORS;  Service: Ophthalmology;  Laterality: Right;  . CIRCUMCISION  10/2003  . COLONOSCOPY N/A 07/11/2012   Procedure: COLONOSCOPY;  Surgeon: Rogene Houston, MD;  Location: [facility] ENDO SUITE;  Service: Endoscopy;  Laterality: N/A;  830-moved to 930 Ann to notify pt  . CORONARY ARTERY BYPASS GRAFT  08/2000   Dr. Servando Snare - LIMA to LAD, SVG to diagonal, SVG to PDA TRIPLE BYPASS  . CYSTOSCOPY W/ RETROGRADES Bilateral 03/02/2015   Procedure: CYSTOSCOPY WITH RIGHT RETROGRADE PYELOGRAM,  ATTEMPTED LEFT RETROGRADE PYELOGRAM;  Surgeon: Cleon Gustin, MD;  Location: [facility] ORS;  Service: Urology;  Laterality: Bilateral;  . TONSILLECTOMY    . TRANSURETHRAL RESECTION OF BLADDER TUMOR N/A 01/26/2015   Procedure: TRANSURETHRAL RESECTION OF BLADDER TUMOR (TURBT);  Surgeon: Cleon Gustin, MD;  Location: [facility] ORS;  Service: Urology;  Laterality: N/A;  . TRANSURETHRAL RESECTION OF BLADDER TUMOR WITH GYRUS (TURBT-GYRUS) N/A 03/02/2015   Procedure: TRANSURETHRAL RESECTION OF BLADDER TUMOR WITH GYRUS (TURBT-GYRUS);  Surgeon: Cleon Gustin, MD;  Location: [facility] ORS;  Service: Urology;  Laterality: N/A;  . TRANSURETHRAL RESECTION OF PROSTATE N/A 01/26/2015   Procedure: TRANSURETHRAL RESECTION OF THE PROSTATE WITH GYRUS INSTRUMENTS;  Surgeon: Cleon Gustin, MD;  Location: [facility] ORS;  Service: Urology;  Laterality: N/A;    Current Outpatient Prescriptions  Medication Sig Dispense Refill  .  ARIPiprazole (ABILIFY) 5 MG tablet Take 5 mg by mouth once.     Marland Kitchen aspirin EC 81 MG tablet Take 81 mg by mouth daily.    Marland Kitchen atorvastatin (LIPITOR) 80 MG tablet Take 80 mg by mouth at bedtime.      . carbamazepine (TEGRETOL) 200 MG tablet Take 400 mg by mouth 2 (two) times daily.     . divalproex (DEPAKOTE) 250 MG DR tablet Take 250 mg by mouth 3 (three) times daily.    Marland Kitchen EPINEPHrine (EPIPEN  2-PAK IJ) Inject as directed as needed.    . furosemide (LASIX) 40 MG tablet Take 2 tablets (80 mg total) by mouth daily as needed for fluid. (Patient taking differently: Take 40 mg by mouth daily. *Take an additional tablet if you gain 3lbs or more) 10 tablet 0  . lisinopril (PRINIVIL,ZESTRIL) 5 MG tablet TAKE ONE TABLET BY MOUTH ONCE DAILY. 90 tablet 2  . temazepam (RESTORIL) 15 MG capsule Take 15 mg by mouth at bedtime as needed for sleep.     No current facility-administered medications for this visit.    Allergies:  Review of patient's allergies indicates no known allergies.   Social History: The patient  reports that he quit smoking about 44 years ago. His smoking use included Cigarettes. He has a 15.00 pack-year smoking history. He does not have any smokeless tobacco history on file. He reports that he does not drink alcohol or use drugs.   ROS:  Please see the history of present illness. Otherwise, complete review of systems is positive for trouble sleeping.  All other systems are reviewed and negative.   Physical Exam: VS:  BP 106/60   Pulse 69   Ht 6' (1.829 m)   Wt 231 lb (104.8 kg)   SpO2 93%   BMI 31.33 kg/m , BMI Body mass index is 31.33 kg/m.  Wt Readings from Last 3 Encounters:  10/23/15 231 lb (104.8 kg)  09/22/15 230 lb (104.3 kg)  07/06/15 232 lb (105.2 kg)    Appears comfortable at rest.  HEENT: Conjunctiva and lids normal, oropharynx clear.  Neck: Supple, no elevated JVP or carotid bruits, no thyromegaly.  Lungs: Clear to auscultation, nonlabored breathing at rest.  Cardiac: Regular rate and rhythm, no S3 or significant systolic murmur, no pericardial rub.  Abdomen: Soft, nontender, bowel sounds present, no guarding or rebound.  Extremities: No pitting edema, distal pulses 2+.  Skin: Warm and dry.  Musculoskeletal: No kyphosis.  Neuropsychiatric: Alert and oriented x3, affect grossly appropriate.  ECG: I personally reviewed the tracing from  01/18/2015 which showed sinus rhythm with prolonged PR interval, incomplete right bundle branch block, and old inferolateral infarct pattern.  Recent Labwork: 01/18/2015: ALT 20; AST 26; B Natriuretic Peptide 183.0 02/26/2015: BUN 19; Creatinine, Ser 1.12; Hemoglobin 11.7; Platelets 180; Potassium 4.4; Sodium 136  June 2017: BUN 26, creatinine 1.2, potassium 5.5, cholesterol 163, triglycerides 77, HDL 52, LDL 96  Other Studies Reviewed Today:  Exercise Cardiolite 11/15/2013: FINDINGS: Exercise stress  Baseline EKG showed sinus rhythm with right bundle branch block, left anterior fascicular block, and 1st degree AV block (trifascular block), inferior and lateral precrodial Q waves. The patient was exercised according to the Bruce protocol for 7 min 42 seconds achieving a work level of 10.1 Mets. The resting heart rate is 69 beats per min rose to a maximal rate of 141 beats per min, representing 95% of the maximal age predicted heart rate. The resting blood pressure of 128/70 increased to  a maximum of 182/80. The test was stopped due to fatigue, the patient did not experience any chest pain. Stress EKG showed no specific ischemic changes and no significant arrhythmias though interpretation is somewhat limited by heavy artifact  Perfusion: There is a large fixed severe defect of the inferior, inferolateral, inferoseptal, inferoapical walls. There are no other myocardial perfusion defects.  Wall Motion: The inferior wall is hypokinetic. The left ventricle is enlarged. There is no transient ischemic dilation.  Left Ventricular Ejection Fraction: 32 %  End diastolic volume 123456 ml  End systolic volume [id number] ml  IMPRESSION: 1. Large fixed scar of the entire inferior wall, there is no peri-infarct ischemia.  2. There is inferior wall hypokinesis. The left ventricle is dilated by volume.  3. Left ventricular ejection fraction 32%  4. Negative stress EKG for ischemia. Duke  treadmill score of 7 consistent with low risk for major cardiac events. Excellent functional capacity (140% of predicted based on age and gender)  5. Overall high risk study for major cardiac events due to low ejection fraction, imaging shows no current myocardium at jeopardy. Consider correlating LVEF by echo. Exercise portion of test and Duke treadmill score suggest lower risk.  Echocardiogram 11/20/2013: Study Conclusions  - Left ventricle: The cavity size was at the upper limits of normal. Systolic function was moderately reduced. The estimated ejection fraction was in the range of 35% to 40%. Speckle tracking biplane LVEF 48%, GLS -14.6%. There is akinesis and scarring of the inferolateral and inferior myocardium. Doppler parameters are consistent with abnormal left ventricular relaxation (grade 1 diastolic dysfunction). - Aortic valve: Mildly calcified annulus. Trileaflet; mildly calcified leaflets. There was no significant regurgitation. - Mitral valve: Calcified annulus. Mildly thickened leaflets . There was mild regurgitation. - Left atrium: The atrium was moderately dilated. - Right atrium: The atrium was mildly dilated. Central venous pressure (est): 3 mm Hg. - Atrial septum: No defect or patent foramen ovale was identified. - Tricuspid valve: There was trivial regurgitation. - Pulmonary arteries: PA peak pressure: 30 mm Hg (S). - Pericardium, extracardiac: There was no pericardial effusion.  Impressions:  - Upper normal LV chamber size with LVEF 35-40% (perhaps better by speckle tracking LVEF), wall motion abnormalities consistent with ischemic cardiomyopathy. Grade 1 diastolic dysfunction. MAC with mild mitral regurgitation. Moderate left atrial enlargement. Trivial tricuspid regurgitation with PASP 30 mmHg.  Assessment and Plan:  1. Multivessel CAD status post CABG in 2002. Myoview study from 2015 showed a large area of inferior scar  but no active ischemia. He is not reporting any angina symptoms on medical therapy, continues to exercise. Plan to continue medical therapy and observation.  2. Ischemic cardiomyopathy with LVEF 35-40% range. He is clinically quite stable without heart failure symptoms and has declined ICD.  3. Hyperlipidemia, continues on Lipitor. Follow-up lab work pending with Dr. Willey Blade.  4. Essential hypertension, blood pressure is well controlled today.  Current medicines were reviewed with the patient today.  Disposition: Follow-up with me in one year.  Signed, [name], MD, [facility] 10/23/2015 8:14 AM    [facility] at [facility] [address], [place], [place] [address] Phone: [phone]; Fax: [phone]

## 2015-10-23 ENCOUNTER — Other Ambulatory Visit: Payer: Self-pay | Admitting: *Deleted

## 2015-10-23 ENCOUNTER — Ambulatory Visit (INDEPENDENT_AMBULATORY_CARE_PROVIDER_SITE_OTHER): Payer: PPO | Admitting: Cardiology

## 2015-10-23 ENCOUNTER — Encounter: Payer: Self-pay | Admitting: Cardiology

## 2015-10-23 VITALS — BP 106/60 | HR 69 | Ht 72.0 in | Wt 231.0 lb

## 2015-10-23 DIAGNOSIS — I255 Ischemic cardiomyopathy: Secondary | ICD-10-CM

## 2015-10-23 DIAGNOSIS — I10 Essential (primary) hypertension: Secondary | ICD-10-CM

## 2015-10-23 DIAGNOSIS — I2510 Atherosclerotic heart disease of native coronary artery without angina pectoris: Secondary | ICD-10-CM

## 2015-10-23 DIAGNOSIS — E782 Mixed hyperlipidemia: Secondary | ICD-10-CM | POA: Diagnosis not present

## 2015-10-23 DIAGNOSIS — I509 Heart failure, unspecified: Secondary | ICD-10-CM | POA: Diagnosis not present

## 2015-10-23 DIAGNOSIS — Z79899 Other long term (current) drug therapy: Secondary | ICD-10-CM | POA: Diagnosis not present

## 2015-10-23 NOTE — Patient Outreach (Signed)
[facility]) Care Management  10/23/2015  Grant Stevens 01/09/1941 [id number]  Received call from patient .  Voices that he had cardiologist appointment today that went well. States next appointment with primary care scheduled for next week 10/20. States he has also seen psychiatrist end of Sept because he was not sleeping well. States his  medication was adjusted & he is having less anxiety & sleeping well now.  States he will have follow up appointment with psychiatrist 10/23.   Nutrition & quality of life assessments completed.  Care plan followed & updated as noted with goals met. Patient states he feels  he understands hypertension materials.    Plan: Will follow up with patient after MD visits. Answer questions that patient may have about HTN material.  Close out case if patient ready. Patient agrees to set appointment for next call.    Sherrin Daisy, RN BSN [place] Management Coordinator [facility] Care Management  [phone]

## 2015-10-23 NOTE — Patient Instructions (Signed)

## 2015-10-26 ENCOUNTER — Ambulatory Visit: Payer: Self-pay | Admitting: *Deleted

## 2015-10-30 DIAGNOSIS — I5022 Chronic systolic (congestive) heart failure: Secondary | ICD-10-CM | POA: Diagnosis not present

## 2015-10-30 DIAGNOSIS — F319 Bipolar disorder, unspecified: Secondary | ICD-10-CM | POA: Diagnosis not present

## 2015-11-02 DIAGNOSIS — F3110 Bipolar disorder, current episode manic without psychotic features, unspecified: Secondary | ICD-10-CM | POA: Diagnosis not present

## 2015-11-09 ENCOUNTER — Encounter: Payer: Self-pay | Admitting: *Deleted

## 2015-11-09 ENCOUNTER — Other Ambulatory Visit: Payer: Self-pay | Admitting: *Deleted

## 2015-11-09 NOTE — Patient Outreach (Signed)
[place] [facility]) Care Management  11/09/2015  Grant Stevens [birth year] [id number]  Telephone call to patient for case closure.   Patient voices that he has attended appointments with primary care, cardiologist and psychiatrist this month. States blood pressure is controlled & that he is taking medications consistently as prescribed by his doctors.   Patient was able to verbalize possible signs of uncontrolled hypertension, complications of uncontrolled hypertension and self management strategies of  contolling hypertension. Patient voices that he has no questions regarding educational materials that were sent to him. States materials sent were very helpful .   Patient goals have been met.  Patient agrees with case closure. Patient has contact information for [facility] care Management services if needed in the future.   Plan. Case closure; send to care management assistant. MD closure letter sent.  Sherrin Daisy, RN BSN [place] Management Coordinator [facility] Care Management  [phone]

## 2015-11-12 DIAGNOSIS — C672 Malignant neoplasm of lateral wall of bladder: Secondary | ICD-10-CM | POA: Diagnosis not present

## 2015-12-05 ENCOUNTER — Other Ambulatory Visit: Payer: Self-pay | Admitting: Cardiology

## 2015-12-07 ENCOUNTER — Other Ambulatory Visit: Payer: Self-pay | Admitting: Cardiology

## 2015-12-23 DIAGNOSIS — Z5111 Encounter for antineoplastic chemotherapy: Secondary | ICD-10-CM | POA: Diagnosis not present

## 2015-12-23 DIAGNOSIS — C672 Malignant neoplasm of lateral wall of bladder: Secondary | ICD-10-CM | POA: Diagnosis not present

## 2015-12-30 DIAGNOSIS — C672 Malignant neoplasm of lateral wall of bladder: Secondary | ICD-10-CM | POA: Diagnosis not present

## 2015-12-30 DIAGNOSIS — Z5111 Encounter for antineoplastic chemotherapy: Secondary | ICD-10-CM | POA: Diagnosis not present

## 2016-01-02 ENCOUNTER — Emergency Department (HOSPITAL_COMMUNITY)
Admission: EM | Admit: 2016-01-02 | Discharge: 2016-01-02 | Disposition: A | Discharge status: Home / Self Care | Payer: PPO | Attending: Emergency Medicine | Admitting: Emergency Medicine

## 2016-01-02 ENCOUNTER — Encounter (HOSPITAL_COMMUNITY): Payer: Self-pay | Admitting: *Deleted

## 2016-01-02 ENCOUNTER — Emergency Department (HOSPITAL_COMMUNITY): Payer: PPO

## 2016-01-02 DIAGNOSIS — Z87891 Personal history of nicotine dependence: Secondary | ICD-10-CM | POA: Diagnosis not present

## 2016-01-02 DIAGNOSIS — R0600 Dyspnea, unspecified: Secondary | ICD-10-CM

## 2016-01-02 DIAGNOSIS — R0602 Shortness of breath: Secondary | ICD-10-CM | POA: Diagnosis not present

## 2016-01-02 DIAGNOSIS — Z79899 Other long term (current) drug therapy: Secondary | ICD-10-CM | POA: Insufficient documentation

## 2016-01-02 DIAGNOSIS — Z8551 Personal history of malignant neoplasm of bladder: Secondary | ICD-10-CM | POA: Insufficient documentation

## 2016-01-02 DIAGNOSIS — Z951 Presence of aortocoronary bypass graft: Secondary | ICD-10-CM | POA: Insufficient documentation

## 2016-01-02 DIAGNOSIS — I10 Essential (primary) hypertension: Secondary | ICD-10-CM | POA: Insufficient documentation

## 2016-01-02 DIAGNOSIS — R600 Localized edema: Secondary | ICD-10-CM | POA: Diagnosis not present

## 2016-01-02 DIAGNOSIS — J45909 Unspecified asthma, uncomplicated: Secondary | ICD-10-CM | POA: Insufficient documentation

## 2016-01-02 DIAGNOSIS — I25810 Atherosclerosis of coronary artery bypass graft(s) without angina pectoris: Secondary | ICD-10-CM | POA: Diagnosis not present

## 2016-01-02 DIAGNOSIS — R05 Cough: Secondary | ICD-10-CM | POA: Diagnosis not present

## 2016-01-02 DIAGNOSIS — Z7982 Long term (current) use of aspirin: Secondary | ICD-10-CM | POA: Insufficient documentation

## 2016-01-02 LAB — COMPREHENSIVE METABOLIC PANEL
ALBUMIN: 3.8 g/dL (ref 3.5–5.0)
ALK PHOS: 81 U/L (ref 38–126)
ALT: 21 U/L (ref 17–63)
AST: 22 U/L (ref 15–41)
Anion gap: 6 (ref 5–15)
BUN: 25 mg/dL — ABNORMAL HIGH (ref 6–20)
CALCIUM: 8.9 mg/dL (ref 8.9–10.3)
CHLORIDE: 104 mmol/L (ref 101–111)
CO2: 28 mmol/L (ref 22–32)
CREATININE: 1.08 mg/dL (ref 0.61–1.24)
GFR calc Af Amer: >60 mL/min (ref >60)
GFR calc non Af Amer: >60 mL/min (ref >60)
GLUCOSE: 103 mg/dL — AB (ref 65–99)
Potassium: 4.3 mmol/L (ref 3.5–5.1)
SODIUM: 138 mmol/L (ref 135–145)
Total Bilirubin: 0.4 mg/dL (ref 0.3–1.2)
Total Protein: 6.8 g/dL (ref 6.5–8.1)

## 2016-01-02 LAB — CBC WITH DIFFERENTIAL/PLATELET
Basophils Absolute: 0 10*3/uL (ref 0.0–0.1)
Basophils Relative: 0 %
EOS PCT: 3 %
Eosinophils Absolute: 0.2 10*3/uL (ref 0.0–0.7)
HEMATOCRIT: 38.5 % — AB (ref 39.0–52.0)
Hemoglobin: 12.6 g/dL — ABNORMAL LOW (ref 13.0–17.0)
LYMPHS ABS: 1 10*3/uL (ref 0.7–4.0)
LYMPHS PCT: 13 %
MCH: 31.3 pg (ref 26.0–34.0)
MCHC: 32.7 g/dL (ref 30.0–36.0)
MCV: 95.8 fL (ref 78.0–100.0)
MONO ABS: 0.7 10*3/uL (ref 0.1–1.0)
Monocytes Relative: 9 %
NEUTROS ABS: 5.7 10*3/uL (ref 1.7–7.7)
Neutrophils Relative %: 75 %
PLATELETS: 137 10*3/uL — AB (ref 150–400)
RBC: 4.02 MIL/uL — ABNORMAL LOW (ref 4.22–5.81)
RDW: 13.2 % (ref 11.5–15.5)
WBC: 7.6 10*3/uL (ref 4.0–10.5)

## 2016-01-02 LAB — BRAIN NATRIURETIC PEPTIDE: B Natriuretic Peptide: 61 pg/mL (ref 0.0–100.0)

## 2016-01-02 LAB — TROPONIN I: Troponin I: <0.03 ng/mL (ref <0.03)

## 2016-01-02 MED ORDER — ALBUTEROL SULFATE HFA 108 (90 BASE) MCG/ACT IN AERS: 2.0000 | INHALATION_SPRAY | RESPIRATORY_TRACT | 0 refills | Status: DC | PRN

## 2016-01-02 MED ORDER — AEROCHAMBER Z-STAT PLUS/MEDIUM MISC: 1.0000 | Freq: Once | Status: DC

## 2016-01-02 NOTE — ED Notes (Signed)
Patient with no complaints at this time. Respirations even and unlabored. Skin warm/dry. Discharge instructions reviewed with patient at this time. Patient given opportunity to voice concerns/ask questions. IV removed and band-aid applied. Patient discharged at this time and left Emergency Department with steady gait.

## 2016-01-02 NOTE — ED Notes (Signed)
Pt states he still feels short of breath

## 2016-01-02 NOTE — ED Provider Notes (Signed)
[facility] DEPT Provider Note   CSN: [id number] Arrival date & time: 01/02/16  0419  Time seen 04:50 AM   History   Chief Complaint Chief Complaint  Patient presents with  . Shortness of Breath    HPI Grant Stevens is a 75 y.o. male.  HPI  patient reports she's been short of breath and having wheezing for a few days. He states his legs have been swelling however he denies abdominal swelling or distention. He denies chest pain. He states he rarely coughs and sometimes has some white sputum production. He states he has had asthma in the past but hasn't used an inhaler in many years. He also reports a history of congestive heart failure past. After examining patient he then tells me he hasn't had wheezing and a couple of days. No fever. He states his girlfriend noted he seemed short of breath at dinner tonight.  PCP [name], MD Cardiology Dr Domenic Polite.    Past Medical History:  Diagnosis Date  . Allergic rhinitis   . Arthritis   . Asthma    Childhood  . Atrial fibrillation ([place])    Remote history - WARCEF  . Bladder cancer ([place])   . BPH (benign prostatic hyperplasia)   . Cardiomyopathy ([place])   . Coronary atherosclerosis of native coronary artery    Multivessel s/p CABG in 06/2001 post-IMI, LVEF 35% up to 50% postoperatively;  . Depression   . Difficulty sleeping   . Erectile dysfunction   . Essential hypertension, benign   . Frequency of urination   . History of bipolar disorder   . Hyperlipidemia   . IBS (irritable bowel syndrome)   . Myocardial infarction 2002    Patient Active Problem List   Diagnosis Date Noted  . BPH (benign prostatic hyperplasia) 01/26/2015  . Dizziness 10/19/2014  . Coronary atherosclerosis of native coronary artery   . History of atrial fibrillation   . Essential hypertension, benign 09/22/2010  . Mixed hyperlipidemia 11/28/2008  . SLEEP APNEA 11/28/2008    Past Surgical History:  Procedure Laterality Date  . CATARACT  EXTRACTION W/PHACO Left 12/29/2014   Procedure: CATARACT EXTRACTION PHACO AND INTRAOCULAR LENS PLACEMENT (IOC);  Surgeon: Williams Che, MD;  Location: [facility];  Service: Ophthalmology;  Laterality: Left;  CDE:3.71  . CATARACT EXTRACTION W/PHACO Right 03/30/2015   Procedure: CATARACT EXTRACTION PHACO AND INTRAOCULAR LENS PLACEMENT RIGHT EYE CDE=2.56;  Surgeon: Williams Che, MD;  Location: [facility];  Service: Ophthalmology;  Laterality: Right;  . CIRCUMCISION  10/2003  . COLONOSCOPY N/A 07/11/2012   Procedure: COLONOSCOPY;  Surgeon: Rogene Houston, MD;  Location: [facility];  Service: Endoscopy;  Laterality: N/A;  830-moved to 930 Ann to notify pt  . CORONARY ARTERY BYPASS GRAFT  08/2000   Dr. Servando Snare - LIMA to LAD, SVG to diagonal, SVG to PDA TRIPLE BYPASS  . CYSTOSCOPY W/ RETROGRADES Bilateral 03/02/2015   Procedure: CYSTOSCOPY WITH RIGHT RETROGRADE PYELOGRAM,  ATTEMPTED LEFT RETROGRADE PYELOGRAM;  Surgeon: Cleon Gustin, MD;  Location: [facility];  Service: Urology;  Laterality: Bilateral;  . TONSILLECTOMY    . TRANSURETHRAL RESECTION OF BLADDER TUMOR N/A 01/26/2015   Procedure: TRANSURETHRAL RESECTION OF BLADDER TUMOR (TURBT);  Surgeon: Cleon Gustin, MD;  Location: [facility];  Service: Urology;  Laterality: N/A;  . TRANSURETHRAL RESECTION OF BLADDER TUMOR WITH GYRUS (TURBT-GYRUS) N/A 03/02/2015   Procedure: TRANSURETHRAL RESECTION OF BLADDER TUMOR WITH GYRUS (TURBT-GYRUS);  Surgeon: Cleon Gustin, MD;  Location: [facility];  Service:  Urology;  Laterality: N/A;  . TRANSURETHRAL RESECTION OF PROSTATE N/A 01/26/2015   Procedure: TRANSURETHRAL RESECTION OF THE PROSTATE WITH GYRUS INSTRUMENTS;  Surgeon: Cleon Gustin, MD;  Location: [facility];  Service: Urology;  Laterality: N/A;       Home Medications    Prior to Admission medications   Medication Sig Start Date End Date Taking? Authorizing Provider  albuterol (PROVENTIL HFA;VENTOLIN HFA) 108 (90 Base) MCG/ACT inhaler Inhale 2  puffs into the lungs every 4 (four) hours as needed. 01/02/16   Rolland Porter, MD  ARIPiprazole (ABILIFY) 5 MG tablet Take 5 mg by mouth once.  10/09/15   Historical Provider, MD  aspirin EC 81 MG tablet Take 81 mg by mouth daily.    Historical Provider, MD  atorvastatin (LIPITOR) 80 MG tablet Take 80 mg by mouth at bedtime.      Historical Provider, MD  carbamazepine (TEGRETOL) 200 MG tablet Take 400 mg by mouth 2 (two) times daily.     Historical Provider, MD  divalproex (DEPAKOTE) 250 MG DR tablet Take 250 mg by mouth 3 (three) times daily.    Historical Provider, MD  EPINEPHrine (EPIPEN 2-PAK IJ) Inject as directed as needed.    Historical Provider, MD  furosemide (LASIX) 40 MG tablet Take 2 tablets (80 mg total) by mouth daily as needed for fluid. Patient taking differently: Take 40 mg by mouth daily. *Take an additional tablet if you gain 3lbs or more 01/19/15   Rolland Porter, MD  lisinopril (PRINIVIL,ZESTRIL) 5 MG tablet TAKE ONE TABLET BY MOUTH ONCE DAILY. 12/07/15   [name], MD  temazepam (RESTORIL) 15 MG capsule Take 15 mg by mouth at bedtime as needed for sleep.    Historical Provider, MD    Family History Family History  Problem Relation Age of Onset  . Asthma Mother   . Heart attack Father 72  . Diabetes Brother     Social History Social History  Substance Use Topics  . Smoking status: Former Smoker    Packs/day: 1.00    Years: 15.00    Types: Cigarettes    Quit date: 01/11/1971  . Smokeless tobacco: Never Used  . Alcohol use No  widower Lives alone   Allergies   Patient has no known allergies.   Review of Systems Review of Systems  All other systems reviewed and are negative.    Physical Exam Updated Vital Signs BP 133/71   Pulse 66   Temp 97.6 F (36.4 C) (Oral)   Resp 13   Ht 6' (1.829 m)   Wt 235 lb (106.6 kg)   SpO2 96%   BMI 31.87 kg/m   Vital signs normal    Physical Exam  Constitutional: He is oriented to person, place, and time. He  appears well-developed and well-nourished.  Non-toxic appearance. He does not appear ill. No distress.  Talkative, no dyspnea with talking  HENT:  Head: Normocephalic and atraumatic.  Right Ear: External ear normal.  Left Ear: External ear normal.  Nose: Nose normal. No mucosal edema or rhinorrhea.  Mouth/Throat: Oropharynx is clear and moist and mucous membranes are normal. No dental abscesses or uvula swelling.  Eyes: Conjunctivae and EOM are normal. Pupils are equal, round, and reactive to light.  Neck: Normal range of motion and full passive range of motion without pain. Neck supple.  Cardiovascular: Normal rate, regular rhythm and normal heart sounds.  Exam reveals no gallop and no friction rub.   No murmur heard. Pulmonary/Chest: Effort  normal and breath sounds normal. No respiratory distress. He has no wheezes. He has no rhonchi. He has no rales. He exhibits no tenderness and no crepitus.  Abdominal: Soft. Normal appearance and bowel sounds are normal. He exhibits no distension. There is no tenderness. There is no rebound and no guarding.  Musculoskeletal: Normal range of motion. He exhibits edema. He exhibits no tenderness.  Moves all extremities well. Is noted to have some pitting edema almost up to his knees.  Neurological: He is alert and oriented to person, place, and time. He has normal strength. No cranial nerve deficit.  Skin: Skin is warm, dry and intact. No rash noted. No erythema. No pallor.  Psychiatric: He has a normal mood and affect. His speech is normal and behavior is normal. His mood appears not anxious.  Nursing note and vitals reviewed.    ED Treatments / Results  Labs (all labs ordered are listed, but only abnormal results are displayed) Results for orders placed or performed during the hospital encounter of 01/02/16  Comprehensive metabolic panel  Result Value Ref Range   Sodium 138 135 - 145 mmol/L   Potassium 4.3 3.5 - 5.1 mmol/L   Chloride 104 101 - 111  mmol/L   CO2 28 22 - 32 mmol/L   Glucose, Bld 103 (H) 65 - 99 mg/dL   BUN 25 (H) 6 - 20 mg/dL   Creatinine, Ser 1.08 0.61 - 1.24 mg/dL   Calcium 8.9 8.9 - 10.3 mg/dL   Total Protein 6.8 6.5 - 8.1 g/dL   Albumin 3.8 3.5 - 5.0 g/dL   AST 22 15 - 41 U/L   ALT 21 17 - 63 U/L   Alkaline Phosphatase 81 38 - 126 U/L   Total Bilirubin 0.4 0.3 - 1.2 mg/dL   GFR calc non Af Amer >60 >60 mL/min   GFR calc Af Amer >60 >60 mL/min   Anion gap 6 5 - 15  Troponin I  Result Value Ref Range   Troponin I <0.03 <0.03 ng/mL  Brain natriuretic peptide  Result Value Ref Range   B Natriuretic Peptide 61.0 0.0 - 100.0 pg/mL  CBC with Differential  Result Value Ref Range   WBC 7.6 4.0 - 10.5 K/uL   RBC 4.02 (L) 4.22 - 5.81 MIL/uL   Hemoglobin 12.6 (L) 13.0 - 17.0 g/dL   HCT 38.5 (L) 39.0 - 52.0 %   MCV 95.8 78.0 - 100.0 fL   MCH 31.3 26.0 - 34.0 pg   MCHC 32.7 30.0 - 36.0 g/dL   RDW 13.2 11.5 - 15.5 %   Platelets 137 (L) 150 - 400 K/uL   Neutrophils Relative % 75 %   Neutro Abs 5.7 1.7 - 7.7 K/uL   Lymphocytes Relative 13 %   Lymphs Abs 1.0 0.7 - 4.0 K/uL   Monocytes Relative 9 %   Monocytes Absolute 0.7 0.1 - 1.0 K/uL   Eosinophils Relative 3 %   Eosinophils Absolute 0.2 0.0 - 0.7 K/uL   Basophils Relative 0 %   Basophils Absolute 0.0 0.0 - 0.1 K/uL   Laboratory interpretation all normal except Mild anemia    EKG  EKG Interpretation  Date/Time:  Saturday January 02 2016 04:36:49 EST Ventricular Rate:  69 PR Interval:    QRS Duration: 160 QT Interval:  436 QTC Calculation: 468 R Axis:   -49 Text Interpretation:  Sinus rhythm Prolonged PR interval Right bundle branch block Since last tracing rate slower 18 Jan 2015 Confirmed by [facility]  MD-I, Sofya Moustafa ([id number]) on 01/02/2016 4:41:55 AM       Radiology Dg Chest 2 View  Result Date: 01/02/2016 CLINICAL DATA:  75 year old male with cough and shortness of breath EXAM: CHEST  2 VIEW COMPARISON:  Chest radiograph dated 01/18/2015 FINDINGS:  The lungs are clear. There is no pleural effusion or pneumothorax. Stable top-normal cardiac size. Median sternotomy wires and CABG surgery noted. Old healed left rib fracture. No acute osseous pathology. IMPRESSION: No active cardiopulmonary disease. Electronically Signed   By: Anner Crete M.D.   On: 01/02/2016 06:03    Procedures Procedures (including critical care time)  Medications Ordered in ED Medications  aerochamber Z-Stat Plus/medium 1 each (not administered)     Initial Impression / Assessment and Plan / ED Course  I have reviewed the triage vital signs and the nursing notes.  Pertinent labs & imaging results that were available during my care of the patient were reviewed by me and considered in my medical decision making (see chart for details).  Clinical Course    X-rays and laboratory testing was ordered. Patient is in no distress at this point. He does not need a nebulizer, he is not wheezing and he does not have diminished breath sounds. Talk a lot without having dyspnea.  After reviewing his test results asked nursing staff to ambulate patient with a pulse ox.  06:51:12 ED Notes Patient ambulated around nursing station at this time. Patient 02 sat 100 heart rate 87. Patient states that he feels a lot better now than when he came in. Asking how long it will be before he is discharged.   Recheck at 07:05 AM pt's lungs are clear. He said walking didn't make him feel SOB. PT was discharged home with an inhaler, he doesn't have one at home. He should use it if he thinks he is wheezing.    Final Clinical Impressions(s) / ED Diagnoses   Final diagnoses:  Dyspnea, unspecified type    New Prescriptions New Prescriptions   ALBUTEROL (PROVENTIL HFA;VENTOLIN HFA) 108 (90 BASE) MCG/ACT INHALER    Inhale 2 puffs into the lungs every 4 (four) hours as needed.    Plan discharge  Rolland Porter, MD, Barbette Or, MD 01/02/16 [phone]

## 2016-01-02 NOTE — ED Triage Notes (Signed)
Pt c/o cough for the last couple of days with worsening of sx tonight

## 2016-01-02 NOTE — ED Notes (Signed)
Patient ambulated around nursing station at this time. Patient 02 sat 100 heart rate 87. Patient states that he feels a lot better now than when he came in. Asking how long it will be before he is discharged.

## 2016-01-02 NOTE — ED Notes (Signed)
IV established, labs drawn and specimens to lab. Dr Tomi Bamberger in to evaluate

## 2016-01-02 NOTE — Progress Notes (Signed)
Instructed patient on the use of aero chamber (when he fills his prescription) for an Albuterol MDI. Patient verbalized his understanding.

## 2016-01-02 NOTE — ED Notes (Signed)
Pt reports that he has been short of breath for several days. He decided to come for evaluation tonight. He denies chest pain but when he walks appears slight short of breath

## 2016-01-02 NOTE — Discharge Instructions (Signed)
Use the inhaler if you think you are wheezing. You can take an extra lasix for the swelling. Elevate your legs to help with the swelling. Recheck if you are struggling to breathe.

## 2016-01-06 DIAGNOSIS — Z5111 Encounter for antineoplastic chemotherapy: Secondary | ICD-10-CM | POA: Diagnosis not present

## 2016-01-06 DIAGNOSIS — C672 Malignant neoplasm of lateral wall of bladder: Secondary | ICD-10-CM | POA: Diagnosis not present

## 2016-01-13 DIAGNOSIS — F3110 Bipolar disorder, current episode manic without psychotic features, unspecified: Secondary | ICD-10-CM | POA: Diagnosis not present

## 2016-02-01 DIAGNOSIS — Z79899 Other long term (current) drug therapy: Secondary | ICD-10-CM | POA: Diagnosis not present

## 2016-02-08 ENCOUNTER — Other Ambulatory Visit (HOSPITAL_COMMUNITY): Payer: Self-pay | Admitting: Psychiatry

## 2016-02-09 DIAGNOSIS — Z951 Presence of aortocoronary bypass graft: Secondary | ICD-10-CM | POA: Diagnosis not present

## 2016-02-09 DIAGNOSIS — F319 Bipolar disorder, unspecified: Secondary | ICD-10-CM | POA: Diagnosis not present

## 2016-02-09 DIAGNOSIS — I5022 Chronic systolic (congestive) heart failure: Secondary | ICD-10-CM | POA: Diagnosis not present

## 2016-02-09 DIAGNOSIS — E875 Hyperkalemia: Secondary | ICD-10-CM | POA: Diagnosis not present

## 2016-03-07 ENCOUNTER — Other Ambulatory Visit (HOSPITAL_COMMUNITY): Payer: Self-pay | Admitting: Psychiatry

## 2016-03-09 ENCOUNTER — Other Ambulatory Visit (HOSPITAL_COMMUNITY): Payer: Self-pay | Admitting: Psychiatry

## 2016-03-18 DIAGNOSIS — C672 Malignant neoplasm of lateral wall of bladder: Secondary | ICD-10-CM | POA: Diagnosis not present

## 2016-04-11 DIAGNOSIS — F3110 Bipolar disorder, current episode manic without psychotic features, unspecified: Secondary | ICD-10-CM | POA: Diagnosis not present

## 2016-05-04 DIAGNOSIS — I2510 Atherosclerotic heart disease of native coronary artery without angina pectoris: Secondary | ICD-10-CM | POA: Diagnosis not present

## 2016-05-04 DIAGNOSIS — E871 Hypo-osmolality and hyponatremia: Secondary | ICD-10-CM | POA: Diagnosis not present

## 2016-05-04 DIAGNOSIS — Z79899 Other long term (current) drug therapy: Secondary | ICD-10-CM | POA: Diagnosis not present

## 2016-05-10 DIAGNOSIS — I5022 Chronic systolic (congestive) heart failure: Secondary | ICD-10-CM | POA: Diagnosis not present

## 2016-05-10 DIAGNOSIS — Z8551 Personal history of malignant neoplasm of bladder: Secondary | ICD-10-CM | POA: Diagnosis not present

## 2016-05-10 DIAGNOSIS — E875 Hyperkalemia: Secondary | ICD-10-CM | POA: Diagnosis not present

## 2016-05-23 DIAGNOSIS — H01022 Squamous blepharitis right lower eyelid: Secondary | ICD-10-CM | POA: Diagnosis not present

## 2016-05-23 DIAGNOSIS — H04123 Dry eye syndrome of bilateral lacrimal glands: Secondary | ICD-10-CM | POA: Diagnosis not present

## 2016-05-23 DIAGNOSIS — H01021 Squamous blepharitis right upper eyelid: Secondary | ICD-10-CM | POA: Diagnosis not present

## 2016-05-23 DIAGNOSIS — H353132 Nonexudative age-related macular degeneration, bilateral, intermediate dry stage: Secondary | ICD-10-CM | POA: Diagnosis not present

## 2016-05-23 DIAGNOSIS — Z961 Presence of intraocular lens: Secondary | ICD-10-CM | POA: Diagnosis not present

## 2016-05-23 DIAGNOSIS — H01024 Squamous blepharitis left upper eyelid: Secondary | ICD-10-CM | POA: Diagnosis not present

## 2016-05-23 DIAGNOSIS — H01025 Squamous blepharitis left lower eyelid: Secondary | ICD-10-CM | POA: Diagnosis not present

## 2016-05-30 DIAGNOSIS — M722 Plantar fascial fibromatosis: Secondary | ICD-10-CM | POA: Diagnosis not present

## 2016-05-30 DIAGNOSIS — M79671 Pain in right foot: Secondary | ICD-10-CM | POA: Diagnosis not present

## 2016-06-09 ENCOUNTER — Other Ambulatory Visit: Payer: Self-pay | Admitting: Cardiology

## 2016-06-09 DIAGNOSIS — L600 Ingrowing nail: Secondary | ICD-10-CM | POA: Diagnosis not present

## 2016-06-09 DIAGNOSIS — M79674 Pain in right toe(s): Secondary | ICD-10-CM | POA: Diagnosis not present

## 2016-06-10 DIAGNOSIS — F3110 Bipolar disorder, current episode manic without psychotic features, unspecified: Secondary | ICD-10-CM | POA: Diagnosis not present

## 2016-06-13 DIAGNOSIS — C44319 Basal cell carcinoma of skin of other parts of face: Secondary | ICD-10-CM | POA: Diagnosis not present

## 2016-06-13 DIAGNOSIS — D225 Melanocytic nevi of trunk: Secondary | ICD-10-CM | POA: Diagnosis not present

## 2016-06-20 DIAGNOSIS — M79671 Pain in right foot: Secondary | ICD-10-CM | POA: Diagnosis not present

## 2016-06-20 DIAGNOSIS — M722 Plantar fascial fibromatosis: Secondary | ICD-10-CM | POA: Diagnosis not present

## 2016-06-24 DIAGNOSIS — C672 Malignant neoplasm of lateral wall of bladder: Secondary | ICD-10-CM | POA: Diagnosis not present

## 2016-06-27 DIAGNOSIS — M79671 Pain in right foot: Secondary | ICD-10-CM | POA: Diagnosis not present

## 2016-06-27 DIAGNOSIS — M722 Plantar fascial fibromatosis: Secondary | ICD-10-CM | POA: Diagnosis not present

## 2016-07-01 DIAGNOSIS — Z5111 Encounter for antineoplastic chemotherapy: Secondary | ICD-10-CM | POA: Diagnosis not present

## 2016-07-01 DIAGNOSIS — C672 Malignant neoplasm of lateral wall of bladder: Secondary | ICD-10-CM | POA: Diagnosis not present

## 2016-07-08 DIAGNOSIS — C672 Malignant neoplasm of lateral wall of bladder: Secondary | ICD-10-CM | POA: Diagnosis not present

## 2016-07-08 DIAGNOSIS — Z5111 Encounter for antineoplastic chemotherapy: Secondary | ICD-10-CM | POA: Diagnosis not present

## 2016-07-15 DIAGNOSIS — Z5111 Encounter for antineoplastic chemotherapy: Secondary | ICD-10-CM | POA: Diagnosis not present

## 2016-07-15 DIAGNOSIS — C672 Malignant neoplasm of lateral wall of bladder: Secondary | ICD-10-CM | POA: Diagnosis not present

## 2016-07-28 DIAGNOSIS — F3110 Bipolar disorder, current episode manic without psychotic features, unspecified: Secondary | ICD-10-CM | POA: Diagnosis not present

## 2016-08-31 DIAGNOSIS — I2510 Atherosclerotic heart disease of native coronary artery without angina pectoris: Secondary | ICD-10-CM | POA: Diagnosis not present

## 2016-08-31 DIAGNOSIS — E875 Hyperkalemia: Secondary | ICD-10-CM | POA: Diagnosis not present

## 2016-08-31 DIAGNOSIS — I5022 Chronic systolic (congestive) heart failure: Secondary | ICD-10-CM | POA: Diagnosis not present

## 2016-08-31 DIAGNOSIS — Z79899 Other long term (current) drug therapy: Secondary | ICD-10-CM | POA: Diagnosis not present

## 2016-09-07 DIAGNOSIS — I5022 Chronic systolic (congestive) heart failure: Secondary | ICD-10-CM | POA: Diagnosis not present

## 2016-09-07 DIAGNOSIS — E875 Hyperkalemia: Secondary | ICD-10-CM | POA: Diagnosis not present

## 2016-09-07 DIAGNOSIS — E785 Hyperlipidemia, unspecified: Secondary | ICD-10-CM | POA: Diagnosis not present

## 2016-09-12 ENCOUNTER — Other Ambulatory Visit (HOSPITAL_COMMUNITY): Payer: Self-pay | Admitting: Psychiatry

## 2016-09-20 DIAGNOSIS — M545 Low back pain: Secondary | ICD-10-CM | POA: Diagnosis not present

## 2016-09-30 DIAGNOSIS — F3110 Bipolar disorder, current episode manic without psychotic features, unspecified: Secondary | ICD-10-CM | POA: Diagnosis not present

## 2016-10-06 DIAGNOSIS — Z8551 Personal history of malignant neoplasm of bladder: Secondary | ICD-10-CM | POA: Diagnosis not present

## 2016-10-25 ENCOUNTER — Encounter (HOSPITAL_COMMUNITY): Payer: Self-pay | Admitting: *Deleted

## 2016-10-25 ENCOUNTER — Emergency Department (HOSPITAL_COMMUNITY): Payer: PPO

## 2016-10-25 ENCOUNTER — Emergency Department (HOSPITAL_COMMUNITY)
Admission: EM | Admit: 2016-10-25 | Discharge: 2016-10-26 | Disposition: A | Discharge status: Home / Self Care | Payer: PPO | Attending: Emergency Medicine | Admitting: Emergency Medicine

## 2016-10-25 DIAGNOSIS — Z951 Presence of aortocoronary bypass graft: Secondary | ICD-10-CM | POA: Diagnosis not present

## 2016-10-25 DIAGNOSIS — J029 Acute pharyngitis, unspecified: Secondary | ICD-10-CM | POA: Insufficient documentation

## 2016-10-25 DIAGNOSIS — Z8551 Personal history of malignant neoplasm of bladder: Secondary | ICD-10-CM | POA: Diagnosis not present

## 2016-10-25 DIAGNOSIS — R05 Cough: Secondary | ICD-10-CM | POA: Diagnosis not present

## 2016-10-25 DIAGNOSIS — Z79899 Other long term (current) drug therapy: Secondary | ICD-10-CM | POA: Diagnosis not present

## 2016-10-25 DIAGNOSIS — Z7982 Long term (current) use of aspirin: Secondary | ICD-10-CM | POA: Diagnosis not present

## 2016-10-25 DIAGNOSIS — I10 Essential (primary) hypertension: Secondary | ICD-10-CM | POA: Diagnosis not present

## 2016-10-25 DIAGNOSIS — Z87891 Personal history of nicotine dependence: Secondary | ICD-10-CM | POA: Insufficient documentation

## 2016-10-25 DIAGNOSIS — I2510 Atherosclerotic heart disease of native coronary artery without angina pectoris: Secondary | ICD-10-CM | POA: Diagnosis not present

## 2016-10-25 DIAGNOSIS — R6883 Chills (without fever): Secondary | ICD-10-CM | POA: Diagnosis not present

## 2016-10-25 DIAGNOSIS — R059 Cough, unspecified: Secondary | ICD-10-CM

## 2016-10-25 MED ORDER — DOXYCYCLINE HYCLATE 100 MG PO CAPS: 100.0000 mg | ORAL_CAPSULE | Freq: Two times a day (BID) | ORAL | 0 refills | Status: DC

## 2016-10-25 NOTE — ED Notes (Signed)
Pt states productive cough for the past week. Pt says not getting better.

## 2016-10-25 NOTE — ED Provider Notes (Signed)
[facility] EMERGENCY DEPARTMENT Provider Note   CSN: [id number] Arrival date & time: 10/25/16  2137     History   Chief Complaint Chief Complaint  Patient presents with  . Cough    HPI Grant Stevens is a 76 y.o. male.  The history is provided by the patient and a friend.  Cough  This is a new problem. The current episode started more than 2 days ago. The problem occurs every few minutes. The problem has been gradually worsening. The cough is non-productive. There has been no fever. Associated symptoms include chills and sore throat. Pertinent negatives include no chest pain and no shortness of breath. Risk factors: no foreign travel. He is not a smoker.  pt reports cough for up to a week No hemoptysis No SOB No CP No dyspnea on exertion No orthopnea   Past Medical History:  Diagnosis Date  . Allergic rhinitis   . Arthritis   . Asthma    Childhood  . Atrial fibrillation ([place])    Remote history - WARCEF  . Bladder cancer ([place])   . BPH (benign prostatic hyperplasia)   . Cardiomyopathy ([place])   . Coronary atherosclerosis of native coronary artery    Multivessel s/p CABG in 06/2001 post-IMI, LVEF 35% up to 50% postoperatively;  . Depression   . Difficulty sleeping   . Erectile dysfunction   . Essential hypertension, benign   . Frequency of urination   . History of bipolar disorder   . Hyperlipidemia   . IBS (irritable bowel syndrome)   . Myocardial infarction [facility]) 2002    Patient Active Problem List   Diagnosis Date Noted  . BPH (benign prostatic hyperplasia) 01/26/2015  . Dizziness 10/19/2014  . Coronary atherosclerosis of native coronary artery   . History of atrial fibrillation   . Essential hypertension, benign 09/22/2010  . Mixed hyperlipidemia 11/28/2008  . SLEEP APNEA 11/28/2008    Past Surgical History:  Procedure Laterality Date  . CATARACT EXTRACTION W/PHACO Left 12/29/2014   Procedure: CATARACT EXTRACTION PHACO AND INTRAOCULAR LENS  PLACEMENT (IOC);  Surgeon: Williams Che, MD;  Location: [facility];  Service: Ophthalmology;  Laterality: Left;  CDE:3.71  . CATARACT EXTRACTION W/PHACO Right 03/30/2015   Procedure: CATARACT EXTRACTION PHACO AND INTRAOCULAR LENS PLACEMENT RIGHT EYE CDE=2.56;  Surgeon: Williams Che, MD;  Location: [facility];  Service: Ophthalmology;  Laterality: Right;  . CIRCUMCISION  10/2003  . COLONOSCOPY N/A 07/11/2012   Procedure: COLONOSCOPY;  Surgeon: Rogene Houston, MD;  [facility]: [facility];  Service: Endoscopy;  Laterality: N/A;  830-moved to 930 Ann to notify pt  . CORONARY ARTERY BYPASS GRAFT  08/2000   Dr. Servando Snare - LIMA to LAD, SVG to diagonal, SVG to PDA TRIPLE BYPASS  . CYSTOSCOPY W/ RETROGRADES Bilateral 03/02/2015   Procedure: CYSTOSCOPY WITH RIGHT RETROGRADE PYELOGRAM,  ATTEMPTED LEFT RETROGRADE PYELOGRAM;  Surgeon: Cleon Gustin, MD;  Location: [facility];  Service: Urology;  Laterality: Bilateral;  . TONSILLECTOMY    . TRANSURETHRAL RESECTION OF BLADDER TUMOR N/A 01/26/2015   Procedure: TRANSURETHRAL RESECTION OF BLADDER TUMOR (TURBT);  Surgeon: Cleon Gustin, MD;  Location: [facility];  Service: Urology;  Laterality: N/A;  . TRANSURETHRAL RESECTION OF BLADDER TUMOR WITH GYRUS (TURBT-GYRUS) N/A 03/02/2015   Procedure: TRANSURETHRAL RESECTION OF BLADDER TUMOR WITH GYRUS (TURBT-GYRUS);  Surgeon: Cleon Gustin, MD;  Location: [facility];  Service: Urology;  Laterality: N/A;  . TRANSURETHRAL RESECTION OF PROSTATE N/A 01/26/2015   Procedure: TRANSURETHRAL RESECTION  OF THE PROSTATE WITH GYRUS INSTRUMENTS;  Surgeon: Cleon Gustin, MD;  Location: [facility];  Service: Urology;  Laterality: N/A;       Home Medications    Prior to Admission medications   Medication Sig Start Date End Date Taking? Authorizing Provider  albuterol (PROVENTIL HFA;VENTOLIN HFA) 108 (90 Base) MCG/ACT inhaler Inhale 2 puffs into the lungs every 4 (four) hours as needed. 01/02/16   Rolland Porter, MD  ARIPiprazole  (ABILIFY) 5 MG tablet Take 5 mg by mouth once.  10/09/15   [provider]  aspirin EC 81 MG tablet Take 81 mg by mouth daily.    [provider]  atorvastatin (LIPITOR) 80 MG tablet Take 80 mg by mouth at bedtime.      [provider]  carbamazepine (TEGRETOL) 200 MG tablet Take 400 mg by mouth 2 (two) times daily.     [provider]  divalproex (DEPAKOTE) 250 MG DR tablet Take 250 mg by mouth 3 (three) times daily.    [provider]  doxycycline (VIBRAMYCIN) 100 MG capsule Take 1 capsule (100 mg total) by mouth 2 (two) times daily. One po bid x 7 days 10/25/16   Ripley Fraise, MD  EPINEPHrine (EPIPEN 2-PAK IJ) Inject as directed as needed.    [provider]  furosemide (LASIX) 40 MG tablet Take 2 tablets (80 mg total) by mouth daily as needed for fluid. Patient taking differently: Take 40 mg by mouth daily. *Take an additional tablet if you gain 3lbs or more 01/19/15   Rolland Porter, MD  lisinopril (PRINIVIL,ZESTRIL) 5 MG tablet TAKE ONE TABLET BY MOUTH ONCE DAILY. 06/09/16   [name], MD  temazepam (RESTORIL) 15 MG capsule Take 15 mg by mouth at bedtime as needed for sleep.    [provider]    Family History Family History  Problem Relation Age of Onset  . Asthma Mother   . Heart attack Father 77  . Diabetes Brother     Social History Social History  Substance Use Topics  . Smoking status: Former Smoker    Packs/day: 1.00    Years: 15.00    Types: Cigarettes    Quit date: 01/11/1971  . Smokeless tobacco: Never Used  . Alcohol use No     Allergies   Patient has no known allergies.   Review of Systems Review of Systems  Constitutional: Positive for chills.  HENT: Positive for sore throat.   Respiratory: Positive for cough. Negative for apnea and shortness of breath.   Cardiovascular: Negative for chest pain and leg swelling.  All other systems reviewed and are negative.    Physical Exam Updated  Vital Signs BP (!) 155/81 (BP Location: Right Arm)   Pulse 69   Temp 98.1 F (36.7 C) (Oral)   Resp 17   Ht 1.829 m (6')   Wt 105.2 kg (232 lb)   SpO2 96%   BMI 31.46 kg/m   Physical Exam CONSTITUTIONAL: elderly, no acute distress HEAD: Normocephalic/atraumatic EYES: EOMI/PERRL ENMT: Mucous membranes moist, congestion noted NECK: supple no meningeal signs SPINE/BACK:entire spine nontender CV: S1/S2 noted, no murmurs/rubs/gallops noted LUNGS: Lungs are clear to auscultation bilaterally, no apparent distress ABDOMEN: soft, nontender, no rebound or guarding, bowel sounds noted throughout abdomen GU:no cva tenderness NEURO: Pt is awake/alert/appropriate, moves all extremitiesx4.  No facial droop.   EXTREMITIES: pulses normal/equal, full ROM, no LE edema noted SKIN: warm, color normal PSYCH: no abnormalities of mood noted, alert and oriented to  situation   ED Treatments / Results  Labs (all labs ordered are listed, but only abnormal results are displayed) Labs Reviewed - No data to display  EKG  EKG Interpretation None       Radiology Dg Chest 2 View  Result Date: 10/25/2016 CLINICAL DATA:  Cough EXAM: CHEST  2 VIEW COMPARISON:  01/02/2016 FINDINGS: Post sternotomy changes. Mild bronchitic changes and hyperinflation. No focal consolidation. Streaky atelectasis at the lingula and bilateral bases. Stable cardiomediastinal silhouette. No pneumothorax. Stable contiguous mild wedge compression deformities at the thoracolumbar junction IMPRESSION: Low lung volumes with streaky atelectasis at the lingula and bilateral lung bases. Mild bronchitic changes. Electronically Signed   By: Donavan Foil M.D.   On: 10/25/2016 22:18    Procedures Procedures (including critical care time)  Medications Ordered in ED Medications - No data to display   Initial Impression / Assessment and Plan / ED Course  I have reviewed the triage vital signs and the nursing notes.  Pertinent    imaging results that were available during my care of the patient were reviewed by me and considered in my medical decision making (see chart for details).     Pt stable No signs of CHF or pneumonia Pt with cough for over a week, will prescribe oral antibiotic I feel he is safe for d/c home We discussed strict ER return precautions   Final Clinical Impressions(s) / ED Diagnoses   Final diagnoses:  Cough    New Prescriptions New Prescriptions   DOXYCYCLINE (VIBRAMYCIN) 100 MG CAPSULE    Take 1 capsule (100 mg total) by mouth 2 (two) times daily. One po bid x 7 days     Ripley Fraise, MD 10/25/16 2357

## 2016-10-25 NOTE — ED Triage Notes (Signed)
Pt c/o cough that is productive with clear sputum since Thursday, has been using otc medication without any relief,

## 2016-10-26 NOTE — ED Notes (Signed)
Pt alert & oriented x4, stable gait. Patient given discharge instructions, paperwork & prescription(s). Patient  instructed to stop at the registration desk to finish any additional paperwork. Patient verbalized understanding. Pt left department w/ no further questions. 

## 2016-11-01 NOTE — Progress Notes (Signed)
Cardiology Office Note  Date: 11/02/2016   ID: Grant Stevens, DOB 1940/02/19, MRN [id number]  PCP: [name], MD  Primary Cardiologist: Rozann Lesches, MD   Chief Complaint  Patient presents with  . Coronary Artery Disease  . Cardiomyopathy    History of Present Illness: Grant Stevens is a 76 y.o. male last seen in October 2017. He presents for a routine follow-up visit. Reports no angina symptoms and stable NYHA class II dyspnea. 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 Also exercises regularly at [facility]. He is not reporting any palpitations or syncope.  I reviewed his current medications which are outlined below. He reports compliance and no intolerances.  Last echocardiogram was in 2015. We did discuss obtaining a follow-up study.  Past Medical History:  Diagnosis Date  . Allergic rhinitis   . Arthritis   . Asthma    Childhood  . Atrial fibrillation ([employer])    Remote history - WARCEF  . Bladder cancer ([place])   . BPH (benign prostatic hyperplasia)   . Cardiomyopathy ([place])   . Coronary atherosclerosis of native coronary artery    Multivessel s/p CABG in 06/2001 post-IMI, LVEF 35% up to 50% postoperatively;  . Depression   . Difficulty sleeping   . Erectile dysfunction   . Essential hypertension, benign   . Frequency of urination   . History of bipolar disorder   . Hyperlipidemia   . IBS (irritable bowel syndrome)   . Myocardial infarction [facility]) 2002    Past Surgical History:  Procedure Laterality Date  . CATARACT EXTRACTION W/PHACO Left 12/29/2014   Procedure: CATARACT EXTRACTION PHACO AND INTRAOCULAR LENS PLACEMENT (IOC);  Surgeon: Williams Che, MD;  Location: [facility] ORS;  Service: Ophthalmology;  Laterality: Left;  CDE:3.71  . CATARACT EXTRACTION W/PHACO Right 03/30/2015   Procedure: CATARACT EXTRACTION PHACO AND INTRAOCULAR LENS PLACEMENT RIGHT EYE CDE=2.56;  Surgeon: Williams Che, MD;  Location: [facility] ORS;  Service:  Ophthalmology;  Laterality: Right;  . CIRCUMCISION  10/2003  . COLONOSCOPY N/A 07/11/2012   Procedure: COLONOSCOPY;  Surgeon: Rogene Houston, MD;  Location: [facility] END[facility];  Service: Endoscopy;  Laterality: N/A;  830-moved to 930 Ann to notify pt  . CORONARY ARTERY BYPASS GRAFT  08/2000   Dr. Servando Snare - LIMA to LAD, SVG to diagonal, SVG to PDA TRIPLE BYPASS  . CYSTOSCOPY W/ RETROGRADES Bilateral 03/02/2015   Procedure: CYSTOSCOPY WITH RIGHT RETROGRADE PYELOGRAM,  ATTEMPTED LEFT RETROGRADE PYELOGRAM;  Surgeon: Cleon Gustin, MD;  Location: [facility];  Service: Urology;  Laterality: Bilateral;  . TONSILLECTOMY    . TRANSURETHRAL RESECTION OF BLADDER TUMOR N/A 01/26/2015   Procedure: TRANSURETHRAL RESECTION OF BLADDER TUMOR (TURBT);  Surgeon: Cleon Gustin, MD;  [facility];  Service: Urology;  Laterality: N/A;  . TRANSURETHRAL RESECTION OF BLADDER TUMOR WITH GYRUS (TURBT-GYRUS) N/A 03/02/2015   Procedure: TRANSURETHRAL RESECTION OF BLADDER TUMOR WITH GYRUS (TURBT-GYRUS);  Surgeon: Cleon Gustin, MD;  [facility];  Service: Urology;  Laterality: N/A;  . TRANSURETHRAL RESECTION OF PROSTATE N/A 01/26/2015   Procedure: TRANSURETHRAL RESECTION OF THE PROSTATE WITH GYRUS INSTRUMENTS;  Surgeon: Cleon Gustin, MD;  [facility];  Service: Urology;  Laterality: N/A;    Current Outpatient Prescriptions  Medication Sig Dispense Refill  . ARIPiprazole (ABILIFY) 5 MG tablet Take 5 mg by mouth once.     Marland Kitchen aspirin EC 81 MG tablet Take 81 mg by mouth daily.    Marland Kitchen  atorvastatin (LIPITOR) 80 MG tablet Take 80 mg by mouth at bedtime.      . carbamazepine (TEGRETOL) 200 MG tablet Take 400 mg by mouth 2 (two) times daily.     . divalproex (DEPAKOTE) 250 MG DR tablet Take 250 mg by mouth 3 (three) times daily.    Marland Kitchen EPINEPHrine (EPIPEN 2-PAK IJ) Inject as directed as needed.    . furosemide (LASIX) 40 MG tablet Take 2 tablets (80 mg total) by mouth daily as needed for fluid. (Patient taking  differently: Take 40 mg by mouth daily. *Take an additional tablet if you gain 3lbs or more) 10 tablet 0  . lisinopril (PRINIVIL,ZESTRIL) 5 MG tablet TAKE ONE TABLET BY MOUTH ONCE DAILY. 90 tablet 3   No current facility-administered medications for this visit.    Allergies:  Patient has no known allergies.   Social History: The patient  reports that he quit smoking about 45 years ago. His smoking use included Cigarettes. He has a 15.00 pack-year smoking history. He has never used smokeless tobacco. He reports that he does not drink alcohol or use drugs.   ROS:  Please see the history of present illness. Otherwise, complete review of systems is positive for recent URI.  All other systems are reviewed and negative.   Physical Exam: VS:  BP 122/72 (BP Location: Right Arm)   Pulse 69   Ht 6' (1.829 m)   Wt 228 lb (103.4 kg)   SpO2 98%   BMI 30.92 kg/m , BMI Body mass index is 30.92 kg/m.  Wt Readings from Last 3 Encounters:  11/02/16 228 lb (103.4 kg)  10/25/16 232 lb (105.2 kg)  01/02/16 235 lb (106.6 kg)    General: Patient appears comfortable at rest. HEENT: Conjunctiva and lids normal, oropharynx clear. Neck: Supple, no elevated JVP or carotid bruits, no thyromegaly. Lungs: Clear to auscultation, nonlabored breathing at rest. Cardiac: Regular rate and rhythm, no S3 or significant systolic murmur, no pericardial rub. Abdomen: Soft, nontender, bowel sounds present, no guarding or rebound. Extremities: No pitting edema, distal pulses 2+. Skin: Warm and dry. Musculoskeletal: No kyphosis. Neuropsychiatric: Alert and oriented x3, affect grossly appropriate.  ECG: I personally reviewed the tracing from 01/02/2016 which showed sinus rhythm with prolonged PR interval and right branch block.  Recent Labwork: 01/02/2016: ALT 21; AST 22; B Natriuretic Peptide 61.0; BUN 25; Creatinine, Ser 1.08; Hemoglobin 12.6; Platelets 137; Potassium 4.3; Sodium 138   Other Studies Reviewed  Today:  Echocardiogram 11/20/2013: Study Conclusions  - Left ventricle: The cavity size was at the upper limits of normal. Systolic function was moderately reduced. The estimated ejection fraction was in the range of 35% to 40%. Speckle tracking biplane LVEF 48%, GLS -14.6%. There is akinesis and scarring of the inferolateral and inferior myocardium. Doppler parameters are consistent with abnormal left ventricular relaxation (grade 1 diastolic dysfunction). - Aortic valve: Mildly calcified annulus. Trileaflet; mildly calcified leaflets. There was no significant regurgitation. - Mitral valve: Calcified annulus. Mildly thickened leaflets . There was mild regurgitation. - Left atrium: The atrium was moderately dilated. - Right atrium: The atrium was mildly dilated. Central venous pressure (est): 3 mm Hg. - Atrial septum: No defect or patent foramen ovale was identified. - Tricuspid valve: There was trivial regurgitation. - Pulmonary arteries: PA peak pressure: 30 mm Hg (S). - Pericardium, extracardiac: There was no pericardial effusion.  Impressions:  - Upper normal LV chamber size with LVEF 35-40% (perhaps better by speckle tracking LVEF), wall motion abnormalities consistent  with ischemic cardiomyopathy. Grade 1 diastolic dysfunction. MAC with mild mitral regurgitation. Moderate left atrial enlargement. Trivial tricuspid regurgitation with PASP 30 mmHg.  Chest x-ray 10/25/2016: FINDINGS: Post sternotomy changes. Mild bronchitic changes and hyperinflation. No focal consolidation. Streaky atelectasis at the lingula and bilateral bases. Stable cardiomediastinal silhouette. No pneumothorax. Stable contiguous mild wedge compression deformities at the thoracolumbar junction  IMPRESSION: Low lung volumes with streaky atelectasis at the lingula and bilateral lung bases. Mild bronchitic changes.  Assessment and Plan:  1. Multivessel CAD status post CABG  in 2002. Last ischemic testing in 2015 showed a large region of inferior scar without ischemia and he continues to do well without active angina symptoms on medical therapy and with regular exercise.  2. Ischemic cardiomyopathy, LVEF 35-40% in 2015. We will obtain a follow-up echocardiogram for reassessment. He has declined ICD.  3. Hyperlipidemia, continues on Lipitor with follow-up per Dr. Willey Blade.  4. Essential hypertension, blood pressure well controlled today.  Current medicines were reviewed with the patient today.   Orders Placed This Encounter  Procedures  . ECHOCARDIOGRAM COMPLETE    Disposition: Follow-up in one year, sooner if needed.  Signed, [name], MD, [facility] 11/02/2016 12:04 PM    [facility] at [facility] [address], [place], [place] [address] Phone: [phone]; Fax: [phone]

## 2016-11-02 ENCOUNTER — Encounter: Payer: Self-pay | Admitting: Cardiology

## 2016-11-02 ENCOUNTER — Ambulatory Visit (INDEPENDENT_AMBULATORY_CARE_PROVIDER_SITE_OTHER): Payer: PPO | Admitting: Cardiology

## 2016-11-02 VITALS — BP 122/72 | HR 69 | Ht 72.0 in | Wt 228.0 lb

## 2016-11-02 DIAGNOSIS — I255 Ischemic cardiomyopathy: Secondary | ICD-10-CM

## 2016-11-02 DIAGNOSIS — I10 Essential (primary) hypertension: Secondary | ICD-10-CM | POA: Diagnosis not present

## 2016-11-02 DIAGNOSIS — E782 Mixed hyperlipidemia: Secondary | ICD-10-CM | POA: Diagnosis not present

## 2016-11-02 DIAGNOSIS — I25119 Atherosclerotic heart disease of native coronary artery with unspecified angina pectoris: Secondary | ICD-10-CM

## 2016-11-02 NOTE — Patient Instructions (Signed)

## 2016-11-04 ENCOUNTER — Ambulatory Visit (HOSPITAL_COMMUNITY)
Admission: RE | Admit: 2016-11-04 | Discharge: 2016-11-04 | Disposition: A | Discharge status: Home / Self Care | Payer: PPO | Source: Ambulatory Visit | Attending: Cardiology | Admitting: Cardiology

## 2016-11-04 DIAGNOSIS — I255 Ischemic cardiomyopathy: Secondary | ICD-10-CM | POA: Diagnosis not present

## 2016-11-04 DIAGNOSIS — I700 Atherosclerosis of aorta: Secondary | ICD-10-CM | POA: Diagnosis not present

## 2016-11-04 LAB — ECHOCARDIOGRAM COMPLETE
CHL CUP DOP CALC LVOT VTI: 19.8 cm
CHL CUP MV DEC (S): 246
CHL CUP STROKE VOLUME: 62 mL
CHL CUP TV REG PEAK VELOCITY: 248 cm/s
E decel time: 246 msec
E/e' ratio: 7.74
FS: 20 % — AB (ref 28–44)
IVS/LV PW RATIO, ED: 1.08
LA diam end sys: 43 mm
LA vol: 96.6 mL
LADIAMINDEX: 1.85 cm/m2
LASIZE: 43 mm
LAVOLA4C: 107 mL
LAVOLIN: 41.7 mL/m2
LV E/e'average: 7.74
LV PW d: 12.4 mm — AB (ref 0.6–1.1)
LV TDI E'MEDIAL: 6.42
LV dias vol index: 63 mL/m2
LV sys vol index: 37 mL/m2
LV sys vol: 85 mL — AB (ref 21–61)
LVDIAVOL: 147 mL (ref 62–150)
LVEEMED: 7.74
LVELAT: 10.8 cm/s
LVOT area: 2.84 cm2
LVOT diameter: 19 mm
LVOT peak grad rest: 3 mmHg
LVOT peak vel: 93.3 cm/s
LVOTSV: 56 mL
Lateral S' vel: 9.68 cm/s
MV pk A vel: 94.4 m/s
MVPG: 3 mmHg
MVPKEVEL: 83.6 m/s
RV sys press: 33 mmHg
Simpson's disk: 42
TAPSE: 15.1 mm
TDI e' lateral: 10.8
TRMAXVEL: 248 cm/s

## 2016-11-04 NOTE — Progress Notes (Signed)
*  PRELIMINARY RESULTS* Echocardiogram 2D Echocardiogram has been performed.  Grant Stevens 11/04/2016, 1:28 PM

## 2016-11-05 ENCOUNTER — Other Ambulatory Visit (HOSPITAL_COMMUNITY): Payer: Self-pay | Admitting: Psychiatry

## 2016-11-29 DIAGNOSIS — Z79899 Other long term (current) drug therapy: Secondary | ICD-10-CM | POA: Diagnosis not present

## 2016-11-29 DIAGNOSIS — I5022 Chronic systolic (congestive) heart failure: Secondary | ICD-10-CM | POA: Diagnosis not present

## 2016-11-29 DIAGNOSIS — E785 Hyperlipidemia, unspecified: Secondary | ICD-10-CM | POA: Diagnosis not present

## 2016-12-05 DIAGNOSIS — L600 Ingrowing nail: Secondary | ICD-10-CM | POA: Diagnosis not present

## 2016-12-05 DIAGNOSIS — M79674 Pain in right toe(s): Secondary | ICD-10-CM | POA: Diagnosis not present

## 2016-12-09 DIAGNOSIS — F319 Bipolar disorder, unspecified: Secondary | ICD-10-CM | POA: Diagnosis not present

## 2016-12-09 DIAGNOSIS — I5022 Chronic systolic (congestive) heart failure: Secondary | ICD-10-CM | POA: Diagnosis not present

## 2016-12-09 DIAGNOSIS — E875 Hyperkalemia: Secondary | ICD-10-CM | POA: Diagnosis not present

## 2016-12-26 DIAGNOSIS — F3110 Bipolar disorder, current episode manic without psychotic features, unspecified: Secondary | ICD-10-CM | POA: Diagnosis not present

## 2017-01-06 DIAGNOSIS — C672 Malignant neoplasm of lateral wall of bladder: Secondary | ICD-10-CM | POA: Diagnosis not present

## 2017-01-16 DIAGNOSIS — L600 Ingrowing nail: Secondary | ICD-10-CM | POA: Diagnosis not present

## 2017-01-16 DIAGNOSIS — M79674 Pain in right toe(s): Secondary | ICD-10-CM | POA: Diagnosis not present

## 2017-01-20 DIAGNOSIS — C672 Malignant neoplasm of lateral wall of bladder: Secondary | ICD-10-CM | POA: Diagnosis not present

## 2017-01-20 DIAGNOSIS — Z5111 Encounter for antineoplastic chemotherapy: Secondary | ICD-10-CM | POA: Diagnosis not present

## 2017-01-27 DIAGNOSIS — C672 Malignant neoplasm of lateral wall of bladder: Secondary | ICD-10-CM | POA: Diagnosis not present

## 2017-01-27 DIAGNOSIS — Z5111 Encounter for antineoplastic chemotherapy: Secondary | ICD-10-CM | POA: Diagnosis not present

## 2017-02-03 DIAGNOSIS — Z5111 Encounter for antineoplastic chemotherapy: Secondary | ICD-10-CM | POA: Diagnosis not present

## 2017-02-03 DIAGNOSIS — C672 Malignant neoplasm of lateral wall of bladder: Secondary | ICD-10-CM | POA: Diagnosis not present

## 2017-02-19 IMAGING — DX DG CHEST 2V
2 series · 2 of 2 positions shown · non-contrast
Comparison: Chest radiograph dated 08/02/2011

CLINICAL DATA: 74-year-old male with shortness of breath and CHF

EXAM:
CHEST  2 VIEW

[chest pa]
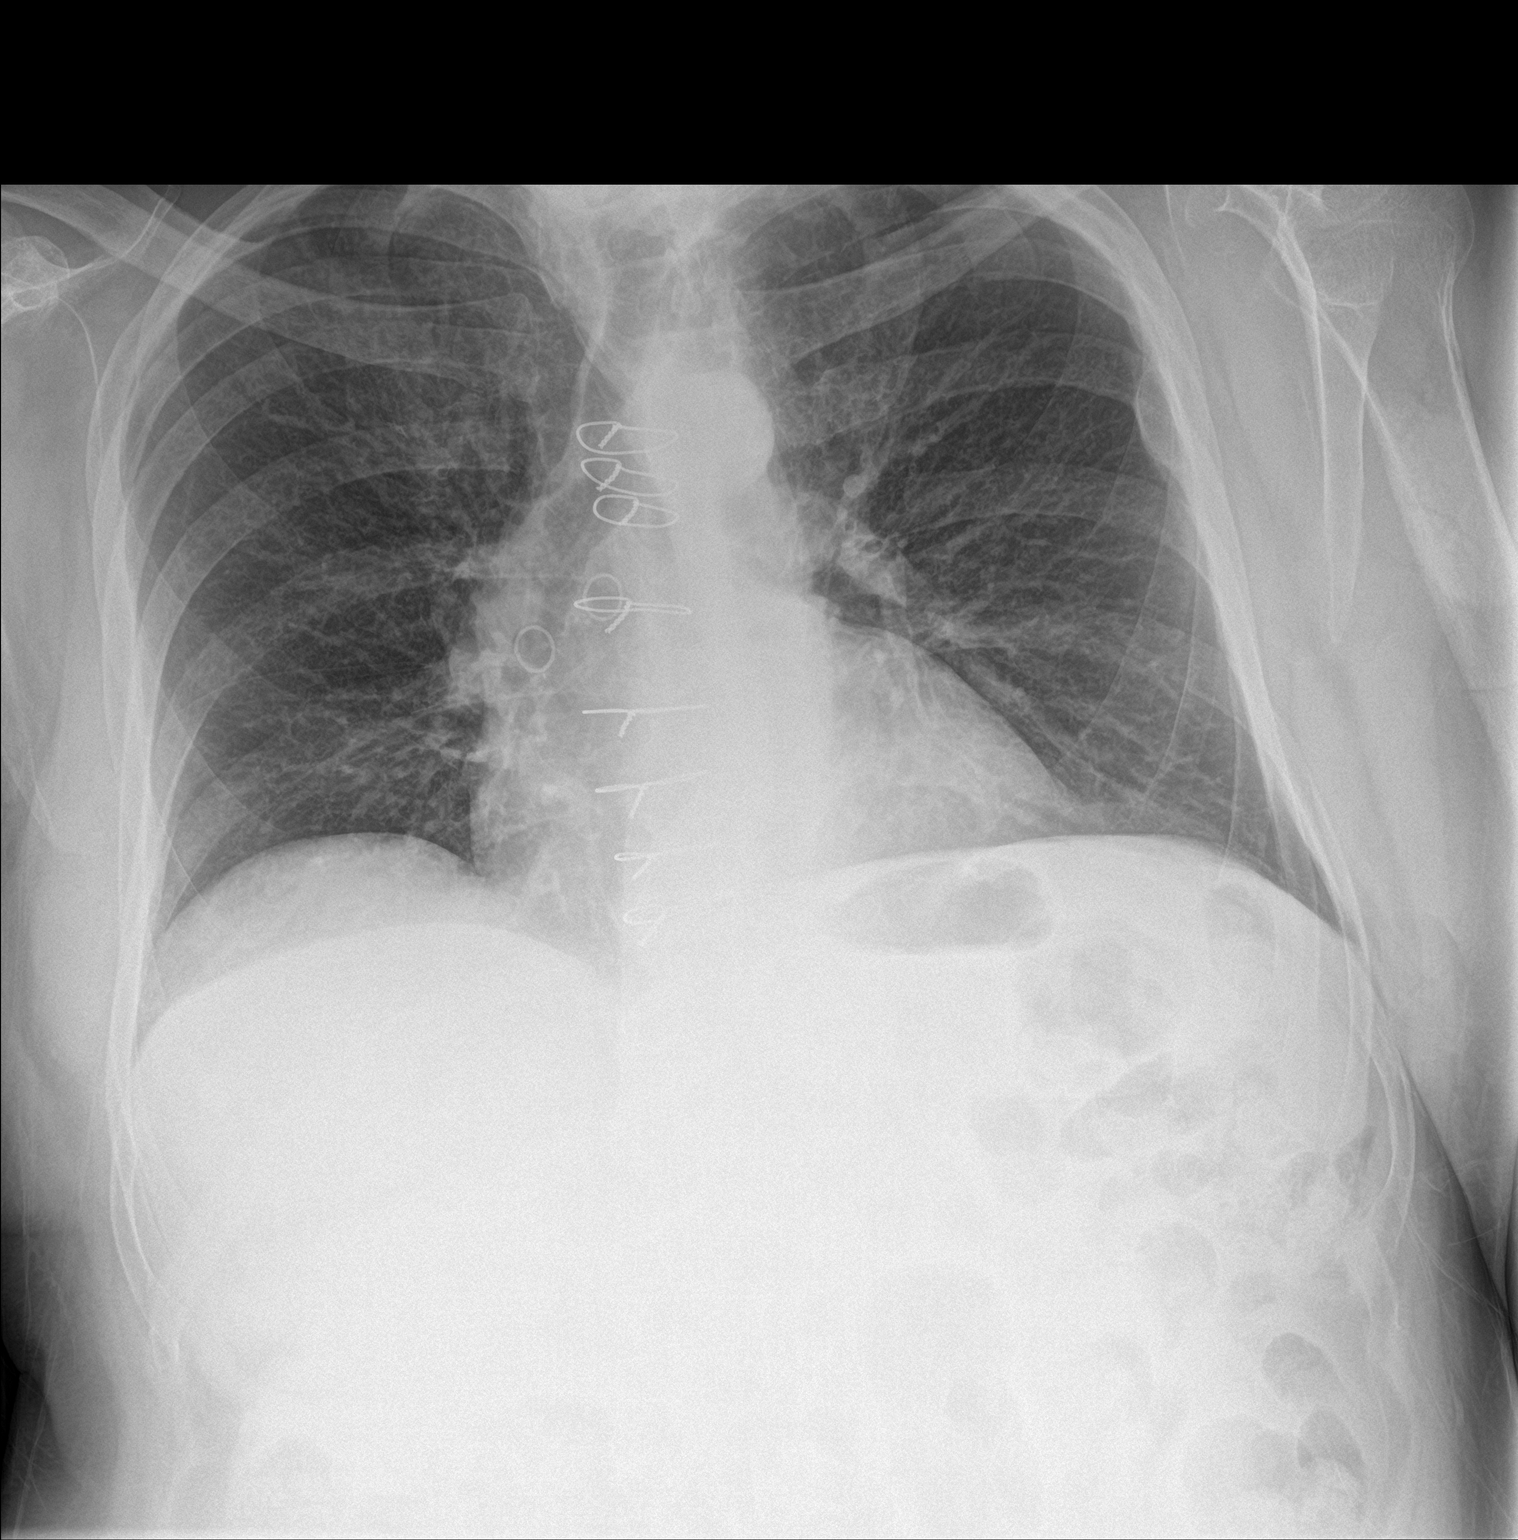

[chest lat]
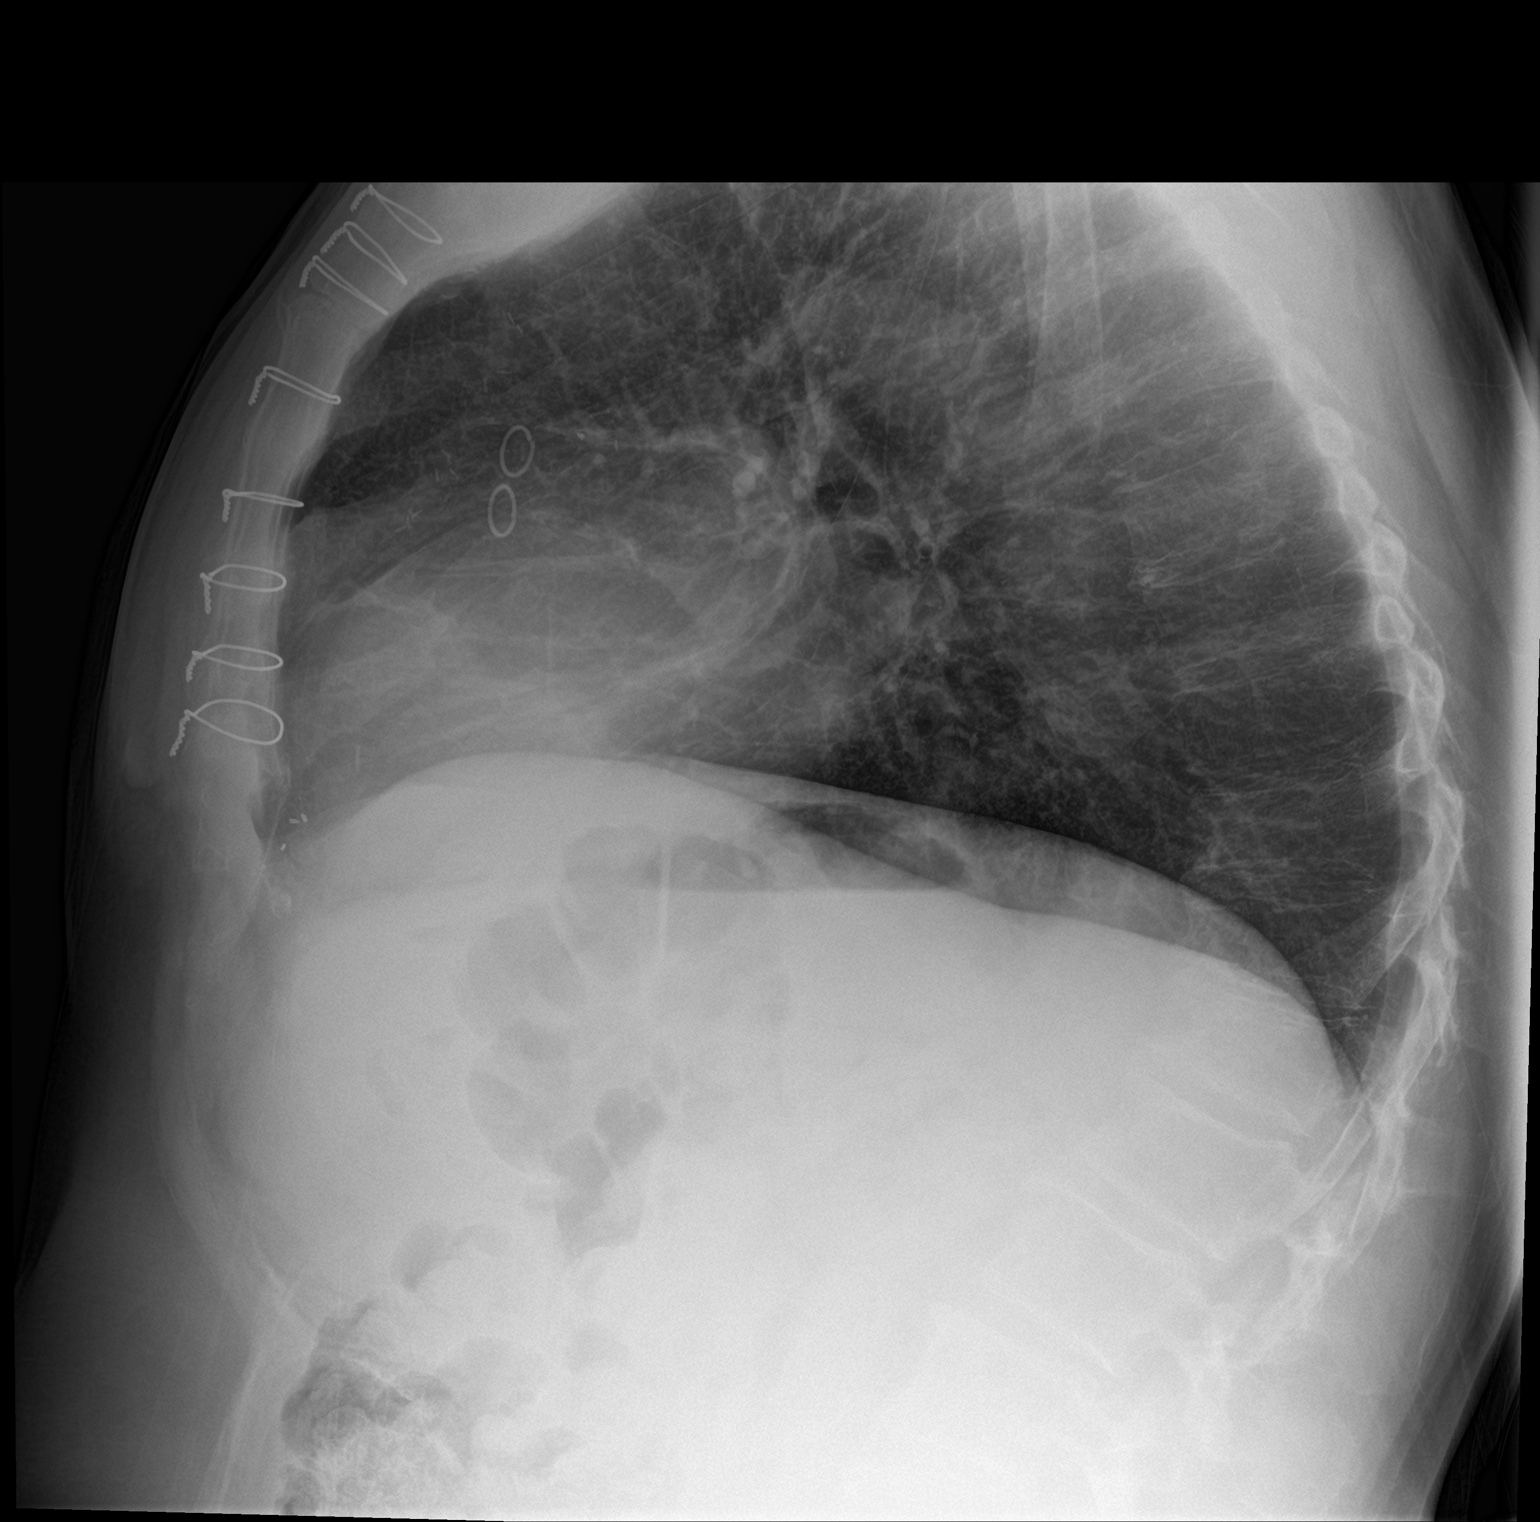

[2 of 2 positions shown; findings below may reference images not displayed]

FINDINGS: No focal consolidation, pleural effusion, or pneumothorax. Stable
cardiac silhouette. Median sternotomy wires. No acute fracture.
IMPRESSION: No active cardiopulmonary disease.

## 2017-03-03 DIAGNOSIS — I5022 Chronic systolic (congestive) heart failure: Secondary | ICD-10-CM | POA: Diagnosis not present

## 2017-03-03 DIAGNOSIS — C679 Malignant neoplasm of bladder, unspecified: Secondary | ICD-10-CM | POA: Diagnosis not present

## 2017-03-03 DIAGNOSIS — E871 Hypo-osmolality and hyponatremia: Secondary | ICD-10-CM | POA: Diagnosis not present

## 2017-03-08 DIAGNOSIS — F319 Bipolar disorder, unspecified: Secondary | ICD-10-CM | POA: Diagnosis not present

## 2017-03-08 DIAGNOSIS — I5022 Chronic systolic (congestive) heart failure: Secondary | ICD-10-CM | POA: Diagnosis not present

## 2017-03-08 DIAGNOSIS — E875 Hyperkalemia: Secondary | ICD-10-CM | POA: Diagnosis not present

## 2017-03-08 DIAGNOSIS — Z6832 Body mass index (BMI) 32.0-32.9, adult: Secondary | ICD-10-CM | POA: Diagnosis not present

## 2017-03-31 DIAGNOSIS — C672 Malignant neoplasm of lateral wall of bladder: Secondary | ICD-10-CM | POA: Diagnosis not present

## 2017-04-24 DIAGNOSIS — F3110 Bipolar disorder, current episode manic without psychotic features, unspecified: Secondary | ICD-10-CM | POA: Diagnosis not present

## 2017-05-26 DIAGNOSIS — H353132 Nonexudative age-related macular degeneration, bilateral, intermediate dry stage: Secondary | ICD-10-CM | POA: Diagnosis not present

## 2017-05-26 DIAGNOSIS — H01021 Squamous blepharitis right upper eyelid: Secondary | ICD-10-CM | POA: Diagnosis not present

## 2017-05-26 DIAGNOSIS — Z961 Presence of intraocular lens: Secondary | ICD-10-CM | POA: Diagnosis not present

## 2017-05-26 DIAGNOSIS — H01022 Squamous blepharitis right lower eyelid: Secondary | ICD-10-CM | POA: Diagnosis not present

## 2017-05-26 DIAGNOSIS — H0289 Other specified disorders of eyelid: Secondary | ICD-10-CM | POA: Diagnosis not present

## 2017-05-26 DIAGNOSIS — H01025 Squamous blepharitis left lower eyelid: Secondary | ICD-10-CM | POA: Diagnosis not present

## 2017-05-26 DIAGNOSIS — H01024 Squamous blepharitis left upper eyelid: Secondary | ICD-10-CM | POA: Diagnosis not present

## 2017-05-26 DIAGNOSIS — H04123 Dry eye syndrome of bilateral lacrimal glands: Secondary | ICD-10-CM | POA: Diagnosis not present

## 2017-06-05 ENCOUNTER — Encounter (HOSPITAL_COMMUNITY): Payer: Self-pay | Admitting: Emergency Medicine

## 2017-06-05 ENCOUNTER — Emergency Department (HOSPITAL_COMMUNITY): Payer: PPO

## 2017-06-05 ENCOUNTER — Other Ambulatory Visit: Payer: Self-pay

## 2017-06-05 ENCOUNTER — Emergency Department (HOSPITAL_COMMUNITY)
Admission: EM | Admit: 2017-06-05 | Discharge: 2017-06-05 | Disposition: A | Discharge status: Home / Self Care | Payer: PPO | Attending: Emergency Medicine | Admitting: Emergency Medicine

## 2017-06-05 DIAGNOSIS — I2510 Atherosclerotic heart disease of native coronary artery without angina pectoris: Secondary | ICD-10-CM | POA: Diagnosis not present

## 2017-06-05 DIAGNOSIS — Z8551 Personal history of malignant neoplasm of bladder: Secondary | ICD-10-CM | POA: Diagnosis not present

## 2017-06-05 DIAGNOSIS — Z951 Presence of aortocoronary bypass graft: Secondary | ICD-10-CM | POA: Diagnosis not present

## 2017-06-05 DIAGNOSIS — J40 Bronchitis, not specified as acute or chronic: Secondary | ICD-10-CM | POA: Diagnosis not present

## 2017-06-05 DIAGNOSIS — Z79899 Other long term (current) drug therapy: Secondary | ICD-10-CM | POA: Insufficient documentation

## 2017-06-05 DIAGNOSIS — Z87891 Personal history of nicotine dependence: Secondary | ICD-10-CM | POA: Diagnosis not present

## 2017-06-05 DIAGNOSIS — Z7982 Long term (current) use of aspirin: Secondary | ICD-10-CM | POA: Insufficient documentation

## 2017-06-05 DIAGNOSIS — R05 Cough: Secondary | ICD-10-CM | POA: Diagnosis not present

## 2017-06-05 DIAGNOSIS — I10 Essential (primary) hypertension: Secondary | ICD-10-CM | POA: Insufficient documentation

## 2017-06-05 MED ORDER — BENZONATATE 100 MG PO CAPS: 100.0000 mg | ORAL_CAPSULE | Freq: Three times a day (TID) | ORAL | 0 refills | Status: DC | PRN

## 2017-06-05 MED ORDER — ALBUTEROL SULFATE HFA 108 (90 BASE) MCG/ACT IN AERS: 2.0000 | INHALATION_SPRAY | RESPIRATORY_TRACT | 0 refills | Status: DC | PRN

## 2017-06-05 MED ORDER — DOXYCYCLINE HYCLATE 100 MG PO TABS: 100.0000 mg | ORAL_TABLET | Freq: Two times a day (BID) | ORAL | 0 refills | Status: DC

## 2017-06-05 NOTE — ED Triage Notes (Signed)
Patient c/o cough and chest congestion x3 days. Per patient cough productive with thick yellow sputum. Denies any shortness of breath, chest pain, fevers, or sinus pressure.

## 2017-06-05 NOTE — ED Provider Notes (Signed)
[facility] EMERGENCY DEPARTMENT Provider Note   CSN: [id number] Arrival date & time: 06/05/17  1025     History   Chief Complaint Chief Complaint  Patient presents with  . Cough    HPI Grant Stevens is a 77 y.o. male.  HPI  Pt was seen at 1125.  Per pt, c/o gradual onset and persistence of constant cough for the past 3 days. Describes the cough as productive of "yellow" sputum. Denies any other symptoms. Denies fevers, no sore throat, no rash, no CP/SOB, no N/V/D, no abd pain.    Past Medical History:  Diagnosis Date  . Allergic rhinitis   . Arthritis   . Asthma    Childhood  . Atrial fibrillation ([employer])    Remote history - WARCEF  . Bladder cancer ([place])   . BPH (benign prostatic hyperplasia)   . Cardiomyopathy ([place])   . Coronary atherosclerosis of native coronary artery    Multivessel s/p CABG in 06/2001 post-IMI, LVEF 35% up to 50% postoperatively;  . Depression   . Difficulty sleeping   . Erectile dysfunction   . Essential hypertension, benign   . Frequency of urination   . History of bipolar disorder   . Hyperlipidemia   . IBS (irritable bowel syndrome)   . Myocardial infarction [facility]) 2002    Patient Active Problem List   Diagnosis Date Noted  . BPH (benign prostatic hyperplasia) 01/26/2015  . Dizziness 10/19/2014  . Coronary atherosclerosis of native coronary artery   . History of atrial fibrillation   . Essential hypertension, benign 09/22/2010  . Mixed hyperlipidemia 11/28/2008  . SLEEP APNEA 11/28/2008    Past Surgical History:  Procedure Laterality Date  . CATARACT EXTRACTION W/PHACO Left 12/29/2014   Procedure: CATARACT EXTRACTION PHACO AND INTRAOCULAR LENS PLACEMENT (IOC);  Surgeon: Williams Che, MD;  Location: [facility];  Service: Ophthalmology;  Laterality: Left;  CDE:3.71  . CATARACT EXTRACTION W/PHACO Right 03/30/2015   Procedure: CATARACT EXTRACTION PHACO AND INTRAOCULAR LENS PLACEMENT RIGHT EYE CDE=2.56;  Surgeon: Williams Che, MD;   Location: [facility] ORS;  Service: Ophthalmology;  Laterality: Right;  . CIRCUMCISION  10/2003  . COLONOSCOPY N/A 07/11/2012   Procedure: COLONOSCOPY;  Surgeon: Rogene Houston, MD;  Location: [facility] ENDO SU[facility];  Service: Endoscopy;  Laterality: N/A;  830-moved to 930 Ann to notify pt  . CORONARY ARTERY BYPASS GRAFT  08/2000   Dr. Servando Snare - LIMA to LAD, SVG to diagonal, SVG to PDA TRIPLE BYPASS  . CYSTOSCOPY W/ RETROGRADES Bilateral 03/02/2015   Procedure: CYSTOSCOPY WITH RIGHT RETROGRADE PYELOGRAM,  ATTEMPTED LEFT RETROGRADE PYELOGRAM;  Surgeon: Cleon Gustin, MD;  Location: [facility];  Service: Urology;  Laterality: Bilateral;  . TONSILLECTOMY    . TRANSURETHRAL RESECTION OF BLADDER TUMOR N/A 01/26/2015   Procedure: TRANSURETHRAL RESECTION OF BLADDER TUMOR (TURBT);  Surgeon: Cleon Gustin, MD;  Location: [facility];  Service: Urology;  Laterality: N/A;  . TRANSURETHRAL RESECTION OF BLADDER TUMOR WITH GYRUS (TURBT-GYRUS) N/A 03/02/2015   Procedure: TRANSURETHRAL RESECTION OF BLADDER TUMOR WITH GYRUS (TURBT-GYRUS);  Surgeon: Cleon Gustin, MD;  [facility];  Service: Urology;  Laterality: N/A;  . TRANSURETHRAL RESECTION OF PROSTATE N/A 01/26/2015   Procedure: TRANSURETHRAL RESECTION OF THE PROSTATE WITH GYRUS INSTRUMENTS;  Surgeon: Cleon Gustin, MD;  Location: [facility];  Service: Urology;  Laterality: N/A;        Home Medications    Prior to Admission medications   Medication Sig Start Date End Date Taking?  Authorizing Provider  ARIPiprazole (ABILIFY) 5 MG tablet Take 5 mg by mouth once.  10/09/15   [provider]  aspirin EC 81 MG tablet Take 81 mg by mouth daily.    [provider]  atorvastatin (LIPITOR) 80 MG tablet Take 80 mg by mouth at bedtime.      [provider]  carbamazepine (TEGRETOL) 200 MG tablet Take 400 mg by mouth 2 (two) times daily.     [provider]  divalproex (DEPAKOTE) 250 MG DR tablet Take 250 mg by mouth 3 (three) times  daily.    [provider]  EPINEPHrine (EPIPEN 2-PAK IJ) Inject as directed as needed.    [provider]  furosemide (LASIX) 40 MG tablet Take 2 tablets (80 mg total) by mouth daily as needed for fluid. Patient taking differently: Take 40 mg by mouth daily. *Take an additional tablet if you gain 3lbs or more 01/19/15   Rolland Porter, MD  lisinopril (PRINIVIL,ZESTRIL) 5 MG tablet TAKE ONE TABLET BY MOUTH ONCE DAILY. 06/09/16   [name], MD    Family History Family History  Problem Relation Age of Onset  . Asthma Mother   . Heart attack Father 72  . Diabetes Brother     Social History Social History   Tobacco Use  . Smoking status: Former Smoker    Packs/day: 1.00    Years: 15.00    Pack years: 15.00    Types: Cigarettes    Last attempt to quit: 01/11/1971    Years since quitting: 46.4  . Smokeless tobacco: Never Used  Substance Use Topics  . Alcohol use: No  . Drug use: No     Allergies   Patient has no known allergies.   Review of Systems Review of Systems ROS: Statement: All systems negative except as marked or noted in the HPI; Constitutional: Negative for fever and chills. ; ; Eyes: Negative for eye pain, redness and discharge. ; ; ENMT: Negative for ear pain, hoarseness, nasal congestion, sinus pressure and sore throat. ; ; Cardiovascular: Negative for chest pain, palpitations, diaphoresis, dyspnea and peripheral edema. ; ; Respiratory: +cough. Negative for wheezing and stridor. ; ; Gastrointestinal: Negative for nausea, vomiting, diarrhea, abdominal pain, blood in stool, hematemesis, jaundice and rectal bleeding. . ; ; Genitourinary: Negative for dysuria, flank pain and hematuria. ; ; Musculoskeletal: Negative for back pain and neck pain. Negative for swelling and trauma.; ; Skin: Negative for pruritus, rash, abrasions, blisters, bruising and skin lesion.; ; Neuro: Negative for headache, lightheadedness and neck stiffness. Negative for weakness,  altered level of consciousness, altered mental status, extremity weakness, paresthesias, involuntary movement, seizure and syncope.       Physical Exam Updated Vital Signs BP (!) 156/80 (BP Location: Left Arm)   Pulse 69   Temp 98 F (36.7 C) (Oral)   Resp 16   Ht 6' (1.829 m)   Wt 104.3 kg (230 lb)   SpO2 98%   BMI 31.19 kg/m   Physical Exam 1130: Physical examination:  Nursing notes reviewed; Vital signs and O2 SAT reviewed;  Constitutional: Well developed, Well nourished, Well hydrated, In no acute distress; Head:  Normocephalic, atraumatic; Eyes: EOMI, PERRL, No scleral icterus; ENMT: TM's clear bilat. +edemetous nasal turbinates bilat with clear rhinorrhea. Mouth and pharynx normal, Mucous membranes moist; Neck: Supple, Full range of motion, No lymphadenopathy; Cardiovascular: Regular rate and rhythm, No gallop; Respiratory: Breath sounds clear & equal bilaterally, No wheezes.  Speaking full sentences with ease,  Normal respiratory effort/excursion; Chest: Nontender, Movement normal; Abdomen: Soft, Nontender, Nondistended, Normal bowel sounds; Genitourinary: No CVA tenderness; Extremities: Peripheral pulses normal, No tenderness, No edema, No calf edema or asymmetry.; Neuro: AA&Ox3, Major CN grossly intact.  Speech clear. No gross focal motor or sensory deficits in extremities.; Skin: Color normal, Warm, Dry.   ED Treatments / Results  Labs (all labs ordered are listed, but only abnormal results are displayed)   EKG None  Radiology   Procedures Procedures (including critical care time)  Medications Ordered in ED Medications - No data to display   Initial Impression / Assessment and Plan / ED Course  I have reviewed the triage vital signs and the nursing notes.  Pertinent labs & imaging results that were available during my care of the patient were reviewed by me and considered in my medical decision making (see chart for details).  MDM Reviewed: previous chart,  nursing note and vitals Interpretation: x-ray   Dg Chest 2 View Result Date: 06/05/2017 CLINICAL DATA:  Cough and chest congestion for 3 days. EXAM: CHEST - 2 VIEW COMPARISON:  Two-view chest x-ray 10/25/2016. FINDINGS: The heart size is normal. Mild atelectasis or scarring in the lingula is similar the prior exam. No other focal airspace disease is present. There is no edema or effusion. Patient is status post median sternotomy for CABG. Remote left-sided rib fractures are again noted. The visualized soft tissues and bony thorax are unremarkable. IMPRESSION: 1. No acute cardiopulmonary disease or significant interval change. Electronically Signed   By: San Morelle M.D.   On: 06/05/2017 11:25    1240:  Workup reassuring. Tx symptomatically, f/u PMD. Dx and testing d/w pt.  Questions answered.  Verb understanding, agreeable to d/c home with outpt f/u.   Final Clinical Impressions(s) / ED Diagnoses   Final diagnoses:  None    ED Discharge Orders    None       Francine Graven, DO 06/10/17 2202

## 2017-06-05 NOTE — Discharge Instructions (Signed)
Take the prescriptions as directed.  Use the albuterol inhaler (2 to 4 puffs) every 4 hours for the next 7 days, then as needed for cough or wheezing.  Call your regular medical doctor tomorrow morning to schedule a follow up appointment within the next 3 days.  Return to the Emergency Department immediately sooner if worsening.

## 2017-06-12 DIAGNOSIS — I5022 Chronic systolic (congestive) heart failure: Secondary | ICD-10-CM | POA: Diagnosis not present

## 2017-06-12 DIAGNOSIS — I2510 Atherosclerotic heart disease of native coronary artery without angina pectoris: Secondary | ICD-10-CM | POA: Diagnosis not present

## 2017-06-12 DIAGNOSIS — Z79899 Other long term (current) drug therapy: Secondary | ICD-10-CM | POA: Diagnosis not present

## 2017-06-21 DIAGNOSIS — E871 Hypo-osmolality and hyponatremia: Secondary | ICD-10-CM | POA: Diagnosis not present

## 2017-06-21 DIAGNOSIS — F319 Bipolar disorder, unspecified: Secondary | ICD-10-CM | POA: Diagnosis not present

## 2017-06-21 DIAGNOSIS — I5022 Chronic systolic (congestive) heart failure: Secondary | ICD-10-CM | POA: Diagnosis not present

## 2017-06-24 ENCOUNTER — Other Ambulatory Visit: Payer: Self-pay | Admitting: Cardiology

## 2017-09-13 DIAGNOSIS — F319 Bipolar disorder, unspecified: Secondary | ICD-10-CM | POA: Diagnosis not present

## 2017-09-13 DIAGNOSIS — I2510 Atherosclerotic heart disease of native coronary artery without angina pectoris: Secondary | ICD-10-CM | POA: Diagnosis not present

## 2017-09-13 DIAGNOSIS — I5022 Chronic systolic (congestive) heart failure: Secondary | ICD-10-CM | POA: Diagnosis not present

## 2017-09-13 DIAGNOSIS — E871 Hypo-osmolality and hyponatremia: Secondary | ICD-10-CM | POA: Diagnosis not present

## 2017-09-13 DIAGNOSIS — Z79899 Other long term (current) drug therapy: Secondary | ICD-10-CM | POA: Diagnosis not present

## 2017-09-18 DIAGNOSIS — F3110 Bipolar disorder, current episode manic without psychotic features, unspecified: Secondary | ICD-10-CM | POA: Diagnosis not present

## 2017-09-19 DIAGNOSIS — I5022 Chronic systolic (congestive) heart failure: Secondary | ICD-10-CM | POA: Diagnosis not present

## 2017-09-19 DIAGNOSIS — F319 Bipolar disorder, unspecified: Secondary | ICD-10-CM | POA: Diagnosis not present

## 2017-09-19 DIAGNOSIS — E785 Hyperlipidemia, unspecified: Secondary | ICD-10-CM | POA: Diagnosis not present

## 2017-09-19 DIAGNOSIS — E871 Hypo-osmolality and hyponatremia: Secondary | ICD-10-CM | POA: Diagnosis not present

## 2017-09-26 DIAGNOSIS — C672 Malignant neoplasm of lateral wall of bladder: Secondary | ICD-10-CM | POA: Diagnosis not present

## 2017-10-11 DIAGNOSIS — N312 Flaccid neuropathic bladder, not elsewhere classified: Secondary | ICD-10-CM | POA: Diagnosis not present

## 2017-10-11 DIAGNOSIS — Z5111 Encounter for antineoplastic chemotherapy: Secondary | ICD-10-CM | POA: Diagnosis not present

## 2017-10-18 DIAGNOSIS — Z5111 Encounter for antineoplastic chemotherapy: Secondary | ICD-10-CM | POA: Diagnosis not present

## 2017-10-18 DIAGNOSIS — C672 Malignant neoplasm of lateral wall of bladder: Secondary | ICD-10-CM | POA: Diagnosis not present

## 2017-10-25 DIAGNOSIS — C672 Malignant neoplasm of lateral wall of bladder: Secondary | ICD-10-CM | POA: Diagnosis not present

## 2017-10-25 DIAGNOSIS — Z5111 Encounter for antineoplastic chemotherapy: Secondary | ICD-10-CM | POA: Diagnosis not present

## 2017-11-02 DIAGNOSIS — X32XXXD Exposure to sunlight, subsequent encounter: Secondary | ICD-10-CM | POA: Diagnosis not present

## 2017-11-02 DIAGNOSIS — B078 Other viral warts: Secondary | ICD-10-CM | POA: Diagnosis not present

## 2017-11-02 DIAGNOSIS — L821 Other seborrheic keratosis: Secondary | ICD-10-CM | POA: Diagnosis not present

## 2017-11-02 DIAGNOSIS — L570 Actinic keratosis: Secondary | ICD-10-CM | POA: Diagnosis not present

## 2017-11-03 NOTE — Progress Notes (Signed)
Cardiology Office Note  Date: 11/06/2017   ID: Grant Stevens, DOB [birth year], MRN [id number]  PCP: [name], MD  Primary Cardiologist: Rozann Lesches, MD   Chief Complaint  Patient presents with  . Coronary Artery Disease  . Cardiomyopathy    History of Present Illness: Grant Stevens is a 77 y.o. male last seen in October 2018.  He is here for a routine visit.  He tells me that he has been doing very well.  He and his girlfriend have been traveling, he also exercises at [employer] three days a week and also walks on a local trail.  He reports no exertional chest pain and describes NYHA class II dyspnea.  He has had no palpitations or syncope.  Follow-up echocardiogram in October of last year revealed LVEF 30 to 35% range with diffuse hypokinesis, akinesis of the mid to basal inferior and mid inferolateral walls, mild diastolic dysfunction, mildly reduced right ventricular contraction, and PA systolic pressure 33 mmHg.  He has declined ICD.  I reviewed his current medications.  He reports compliance and no obvious intolerances.  I personally reviewed his ECG today which shows sinus rhythm with right bundle branch block, old inferior and anterolateral infarct pattern, IVCD.  Also prolonged PR interval.  Past Medical History:  Diagnosis Date  . Allergic rhinitis   . Arthritis   . Asthma    Childhood  . Atrial fibrillation ([place])    Remote history - WARCEF  . Bladder cancer ([place])   . BPH (benign prostatic hyperplasia)   . Cardiomyopathy ([place])   . Coronary atherosclerosis of native coronary artery    Multivessel s/p CABG in 06/2001 post-IMI, LVEF 35% up to 50% postoperatively;  . Depression   . Difficulty sleeping   . Erectile dysfunction   . Essential hypertension, benign   . Frequency of urination   . History of bipolar disorder   . Hyperlipidemia   . IBS (irritable bowel syndrome)   . Myocardial infarction [facility]) 2002    Past Surgical History:  Procedure  Laterality Date  . CATARACT EXTRACTION W/PHACO Left 12/29/2014   Procedure: CATARACT EXTRACTION PHACO AND INTRAOCULAR LENS PLACEMENT (IOC);  Surgeon: Williams Che, MD;  Location: [facility] OR[facility];  Service: Ophthalmology;  Laterality: Left;  CDE:3.71  . CATARACT EXTRACTION W/PHACO Right 03/30/2015   Procedure: CATARACT EXTRACTION PHACO AND INTRAOCULAR LENS PLACEMENT RIGHT EYE CDE=2.56;  Surgeon: Williams Che, MD;  Location: [facility] ORS;  Service: Ophthalmology;  Laterality: Right;  . CIRCUMCISION  10/2003  . COLONOSCOPY N/A 07/11/2012   Procedure: COLONOSCOPY;  Surgeon: Rogene Houston, MD;  Location: [facility] ENDO SUITE;  Service: Endoscopy;  Laterality: N/A;  830-moved to 930 Ann to notify pt  . CORONARY ARTERY BYPASS GRAFT  08/2000   Dr. Servando Snare - LIMA to LAD, SVG to diagonal, SVG to PDA TRIPLE BYPASS  . CYSTOSCOPY W/ RETROGRADES Bilateral 03/02/2015   Procedure: CYSTOSCOPY WITH RIGHT RETROGRADE PYELOGRAM,  ATTEMPTED LEFT RETROGRADE PYELOGRAM;  Surgeon: Cleon Gustin, MD;  Location: [facility];  Service: Urology;  Laterality: Bilateral;  . TONSILLECTOMY    . TRANSURETHRAL RESECTION OF BLADDER TUMOR N/A 01/26/2015   Procedure: TRANSURETHRAL RESECTION OF BLADDER TUMOR (TURBT);  Surgeon: Cleon Gustin, MD;  [facility];  Service: Urology;  Laterality: N/A;  . TRANSURETHRAL RESECTION OF BLADDER TUMOR WITH GYRUS (TURBT-GYRUS) N/A 03/02/2015   Procedure: TRANSURETHRAL RESECTION OF BLADDER TUMOR WITH GYRUS (TURBT-GYRUS);  Surgeon: Cleon Gustin, MD;  [facility];  Service: Urology;  Laterality: N/A;  . TRANSURETHRAL RESECTION OF PROSTATE N/A 01/26/2015   Procedure: TRANSURETHRAL RESECTION OF THE PROSTATE WITH GYRUS INSTRUMENTS;  Surgeon: Cleon Gustin, MD;  [facility];  Service: Urology;  Laterality: N/A;    Current Outpatient Medications  Medication Sig Dispense Refill  . ARIPiprazole (ABILIFY) 5 MG tablet Take 5 mg by mouth once.     Marland Kitchen aspirin EC 81 MG tablet Take 81 mg by mouth  daily.    Marland Kitchen atorvastatin (LIPITOR) 80 MG tablet Take 80 mg by mouth at bedtime.      . carbamazepine (TEGRETOL) 200 MG tablet Take 400 mg by mouth 2 (two) times daily.     . divalproex (DEPAKOTE) 250 MG DR tablet Take 250 mg by mouth 3 (three) times daily.    . furosemide (LASIX) 40 MG tablet Take 40 mg by mouth daily. May take additional tablet for 3 lbs wt gain over night    . lisinopril (PRINIVIL,ZESTRIL) 5 MG tablet TAKE (1) TABLET BY MOUTH ONCE DAILY. 90 tablet 3  . LORazepam (ATIVAN) 0.5 MG tablet Take 0.5 mg by mouth at bedtime.     No current facility-administered medications for this visit.    Allergies:  Patient has no known allergies.   Social History: The patient  reports that he quit smoking about 46 years ago. His smoking use included cigarettes. He has a 15.00 pack-year smoking history. He has never used smokeless tobacco. He reports that he does not drink alcohol or use drugs.   ROS:  Please see the history of present illness. Otherwise, complete review of systems is positive for none.  All other systems are reviewed and negative.   Physical Exam: VS:  BP 134/78   Pulse 70   Ht 6' (1.829 m)   Wt 228 lb (103.4 kg)   SpO2 97%   BMI 30.92 kg/m , BMI Body mass index is 30.92 kg/m.  Wt Readings from Last 3 Encounters:  11/06/17 228 lb (103.4 kg)  06/05/17 230 lb (104.3 kg)  11/02/16 228 lb (103.4 kg)    General: Patient appears comfortable at rest. HEENT: Conjunctiva and lids normal, oropharynx clear. Neck: Supple, no elevated JVP or carotid bruits, no thyromegaly. Lungs: Clear to auscultation, nonlabored breathing at rest. Cardiac: Regular rate and rhythm, no S3 or significant systolic murmur, no pericardial rub. Abdomen: Soft, nontender, bowel sounds present. Extremities: No pitting edema, distal pulses 2+. Skin: Warm and dry. Musculoskeletal: No kyphosis. Neuropsychiatric: Alert and oriented x3, affect grossly appropriate.  ECG: I personally reviewed the  tracing from 01/02/2016 which showed sinus rhythm with prolonged PR interval and right bundle branch block.  Old inferior infarct pattern.  Other Studies Reviewed Today:  Echocardiogram 11/04/2016: Study Conclusions  - Left ventricle: The cavity size was mildly dilated. Wall   thickness was increased in a pattern of mild LVH. Systolic   function was moderately to severely reduced. The estimated   ejection fraction was in the range of 30% to 35%. Diffuse   hypokinesis. Doppler parameters are consistent with abnormal left   ventricular relaxation (grade 1 diastolic dysfunction).   Indeterminate filling pressures. - Regional wall motion abnormality: Akinesis of the basal-mid   inferior and mid inferolateral myocardium. - Aortic valve: Trileaflet; mildly thickened, mildly calcified   leaflets. - Mitral valve: Calcified annulus. - Left atrium: The atrium was moderately dilated. - Right ventricle: Systolic function was mildly reduced. - Tricuspid valve: There was mild regurgitation. - Pulmonary arteries: PA peak  pressure: 33 mm Hg (S). - Inferior vena cava: The vessel was normal in size, with abnormal   respiratory variation. Estimated CVP 8 mmHg.  Assessment and Plan:  1.  Multivessel CAD status post CABG in 2002.  He reports no angina symptoms and describes good exercise tolerance on medical therapy.  Continue aspirin and statin.  2.  Ischemic cardiomyopathy with LVEF 30 to 35%.  He is doing remarkably well in terms of symptoms on limited medical therapy and has declined ICD.  He reports no syncope.  Not on beta-blocker with evidence of conduction system disease.  3.  Hyperlipidemia, continues on Lipitor.  Requesting interval lab work from Dr. Willey Blade.  4.  Essential hypertension, blood pressure control is adequate today.  Current medicines were reviewed with the patient today.   Orders Placed This Encounter  Procedures  . EKG 12-Lead    Disposition: Follow-up in 1  year.  Signed, [name], MD, [facility] 11/06/2017 10:26 AM    [facility] at [facility] [address], [place], [place] [address] Phone: [phone]; Fax: [phone]

## 2017-11-06 ENCOUNTER — Ambulatory Visit (INDEPENDENT_AMBULATORY_CARE_PROVIDER_SITE_OTHER): Payer: PPO | Admitting: Cardiology

## 2017-11-06 ENCOUNTER — Encounter: Payer: Self-pay | Admitting: Cardiology

## 2017-11-06 VITALS — BP 134/78 | HR 70 | Ht 72.0 in | Wt 228.0 lb

## 2017-11-06 DIAGNOSIS — E782 Mixed hyperlipidemia: Secondary | ICD-10-CM | POA: Diagnosis not present

## 2017-11-06 DIAGNOSIS — I10 Essential (primary) hypertension: Secondary | ICD-10-CM | POA: Diagnosis not present

## 2017-11-06 DIAGNOSIS — I255 Ischemic cardiomyopathy: Secondary | ICD-10-CM | POA: Diagnosis not present

## 2017-11-06 DIAGNOSIS — I25119 Atherosclerotic heart disease of native coronary artery with unspecified angina pectoris: Secondary | ICD-10-CM

## 2017-11-06 NOTE — Patient Instructions (Addendum)
Medication Instructions:  Your physician recommends that you continue on your current medications as directed. Please refer to the Current Medication list given to you today.  If you need a refill on your cardiac medications before your next appointment, please call your pharmacy.   Lab work: none If you have labs (blood work) drawn today and your tests are completely normal, you will receive your results only by: Marland Kitchen MyChart Message (if you have MyChart) OR . A paper copy in the mail If you have any lab test that is abnormal or we need to change your treatment, we will call you to review the results.  Testing/Procedures: none  Follow-Up: At [facility], you and your health needs are our priority.  As part of our continuing mission to provide you with exceptional heart care, we have created designated Provider Care Teams.  These Care Teams include your primary Cardiologist (physician) and Advanced Practice Providers (APPs -  Physician Assistants and Nurse Practitioners) who all work together to provide you with the care you need, when you need it. You will need a follow up appointment in 1 year.  Please call our office 2 months in advance to schedule this appointment.  You may see Rozann Lesches, MD or one of the following Advanced Practice Providers on your designated Care Team:   Bernerd Pho, PA-C [facility]) . Ermalinda Barrios, PA-C ([place])  Any Other Special Instructions Will Be Listed Below (If Applicable). None

## 2017-12-12 DIAGNOSIS — I5022 Chronic systolic (congestive) heart failure: Secondary | ICD-10-CM | POA: Diagnosis not present

## 2017-12-12 DIAGNOSIS — Z79899 Other long term (current) drug therapy: Secondary | ICD-10-CM | POA: Diagnosis not present

## 2017-12-20 DIAGNOSIS — F319 Bipolar disorder, unspecified: Secondary | ICD-10-CM | POA: Diagnosis not present

## 2017-12-20 DIAGNOSIS — I5022 Chronic systolic (congestive) heart failure: Secondary | ICD-10-CM | POA: Diagnosis not present

## 2017-12-20 DIAGNOSIS — E875 Hyperkalemia: Secondary | ICD-10-CM | POA: Diagnosis not present

## 2018-01-19 DIAGNOSIS — F3110 Bipolar disorder, current episode manic without psychotic features, unspecified: Secondary | ICD-10-CM | POA: Diagnosis not present

## 2018-02-13 DIAGNOSIS — F319 Bipolar disorder, unspecified: Secondary | ICD-10-CM | POA: Diagnosis not present

## 2018-02-13 DIAGNOSIS — K589 Irritable bowel syndrome without diarrhea: Secondary | ICD-10-CM | POA: Diagnosis not present

## 2018-03-02 ENCOUNTER — Emergency Department (HOSPITAL_COMMUNITY): Payer: PPO

## 2018-03-02 ENCOUNTER — Emergency Department (HOSPITAL_COMMUNITY)
Admission: EM | Admit: 2018-03-02 | Discharge: 2018-03-02 | Disposition: A | Discharge status: Home / Self Care | Payer: PPO | Attending: Emergency Medicine | Admitting: Emergency Medicine

## 2018-03-02 ENCOUNTER — Other Ambulatory Visit: Payer: Self-pay

## 2018-03-02 ENCOUNTER — Encounter (HOSPITAL_COMMUNITY): Payer: Self-pay | Admitting: *Deleted

## 2018-03-02 DIAGNOSIS — Z951 Presence of aortocoronary bypass graft: Secondary | ICD-10-CM | POA: Insufficient documentation

## 2018-03-02 DIAGNOSIS — X58XXXA Exposure to other specified factors, initial encounter: Secondary | ICD-10-CM | POA: Diagnosis not present

## 2018-03-02 DIAGNOSIS — T17308A Unspecified foreign body in larynx causing other injury, initial encounter: Secondary | ICD-10-CM | POA: Diagnosis not present

## 2018-03-02 DIAGNOSIS — Z8551 Personal history of malignant neoplasm of bladder: Secondary | ICD-10-CM | POA: Diagnosis not present

## 2018-03-02 DIAGNOSIS — Z87891 Personal history of nicotine dependence: Secondary | ICD-10-CM | POA: Diagnosis not present

## 2018-03-02 DIAGNOSIS — Z7982 Long term (current) use of aspirin: Secondary | ICD-10-CM | POA: Insufficient documentation

## 2018-03-02 DIAGNOSIS — R05 Cough: Secondary | ICD-10-CM | POA: Insufficient documentation

## 2018-03-02 DIAGNOSIS — F3110 Bipolar disorder, current episode manic without psychotic features, unspecified: Secondary | ICD-10-CM | POA: Diagnosis not present

## 2018-03-02 DIAGNOSIS — Z79899 Other long term (current) drug therapy: Secondary | ICD-10-CM | POA: Insufficient documentation

## 2018-03-02 DIAGNOSIS — I2510 Atherosclerotic heart disease of native coronary artery without angina pectoris: Secondary | ICD-10-CM | POA: Diagnosis not present

## 2018-03-02 DIAGNOSIS — J45909 Unspecified asthma, uncomplicated: Secondary | ICD-10-CM | POA: Insufficient documentation

## 2018-03-02 DIAGNOSIS — I10 Essential (primary) hypertension: Secondary | ICD-10-CM | POA: Diagnosis not present

## 2018-03-02 DIAGNOSIS — Y9389 Activity, other specified: Secondary | ICD-10-CM | POA: Insufficient documentation

## 2018-03-02 DIAGNOSIS — Y929 Unspecified place or not applicable: Secondary | ICD-10-CM | POA: Diagnosis not present

## 2018-03-02 DIAGNOSIS — R0989 Other specified symptoms and signs involving the circulatory and respiratory systems: Secondary | ICD-10-CM | POA: Diagnosis not present

## 2018-03-02 DIAGNOSIS — Y999 Unspecified external cause status: Secondary | ICD-10-CM | POA: Diagnosis not present

## 2018-03-02 NOTE — ED Triage Notes (Signed)
Pt reports he was eating a ham biscuit this morning without drinking anything and he got choked and couldn't breathe. Pt reports he kept coughing and was finally able to get the food up. Pt continues to c/o cough after getting the food up. Pt denies difficulty breathing or sensation of feeling like the food is still stuck. Pt able to tolerate water and oral secretions at this time.

## 2018-03-02 NOTE — Discharge Instructions (Addendum)
Follow-up with your doctor if any problems swallowing or breathing

## 2018-03-02 NOTE — ED Provider Notes (Signed)
[facility] EMERGENCY DEPARTMENT Provider Note   CSN: [id number] Arrival date & time: 03/02/18  0809    History   Chief Complaint No chief complaint on file.   HPI Grant Stevens is a 78 y.o. male.     Patient had a choking spell when eating a ham biscuit.  He eventually was able to cough it up and feels fine now  The history is provided by the patient. No language interpreter was used.  Cough  Cough characteristics:  Dry Sputum characteristics:  Nondescript Severity:  Moderate Onset quality:  Sudden Timing:  Rare Progression:  Resolved Chronicity:  New Smoker: no   Context: not animal exposure   Relieved by:  Nothing Worsened by:  Nothing Ineffective treatments:  None tried Associated symptoms: no chest pain, no eye discharge, no headaches and no rash     Past Medical History:  Diagnosis Date  . Allergic rhinitis   . Arthritis   . Asthma    Childhood  . Atrial fibrillation ([place])    Remote history - WARCEF  . Bladder cancer ([place])   . BPH (benign prostatic hyperplasia)   . Cardiomyopathy ([place])   . Coronary atherosclerosis of native coronary artery    Multivessel s/p CABG in 06/2001 post-IMI, LVEF 35% up to 50% postoperatively;  . Depression   . Difficulty sleeping   . Erectile dysfunction   . Essential hypertension, benign   . Frequency of urination   . History of bipolar disorder   . Hyperlipidemia   . IBS (irritable bowel syndrome)   . Myocardial infarction [facility]) 2002    Patient Active Problem List   Diagnosis Date Noted  . BPH (benign prostatic hyperplasia) 01/26/2015  . Dizziness 10/19/2014  . Coronary atherosclerosis of native coronary artery   . History of atrial fibrillation   . Essential hypertension, benign 09/22/2010  . Mixed hyperlipidemia 11/28/2008  . SLEEP APNEA 11/28/2008    Past Surgical History:  Procedure Laterality Date  . CATARACT EXTRACTION W/PHACO Left 12/29/2014   Procedure: CATARACT EXTRACTION PHACO AND INTRAOCULAR  LENS PLACEMENT (IOC);  Surgeon: Williams Che, MD;  Location: [facility];  Service: Ophthalmology;  Laterality: Left;  CDE:3.71  . CATARACT EXTRACTION W/PHACO Right 03/30/2015   Procedure: CATARACT EXTRACTION PHACO AND INTRAOCULAR LENS PLACEMENT RIGHT EYE CDE=2.56;  Surgeon: Williams Che, MD;  Location: [facility] ORS;  Service: Ophthalmology;  Laterality: Right;  . CIRCUMCISION  10/2003  . COLONOSCOPY N/A 07/11/2012   Procedure: COLONOSCOPY;  Surgeon: Rogene Houston, MD;  Location: [facility];  Service: Endoscopy;  Laterality: N/A;  830-moved to 930 Ann to notify pt  . CORONARY ARTERY BYPASS GRAFT  08/2000   Dr. Servando Snare - LIMA to LAD, SVG to diagonal, SVG to PDA TRIPLE BYPASS  . CYSTOSCOPY W/ RETROGRADES Bilateral 03/02/2015   Procedure: CYSTOSCOPY WITH RIGHT RETROGRADE PYELOGRAM,  ATTEMPTED LEFT RETROGRADE PYELOGRAM;  Surgeon: Cleon Gustin, MD;  Location[facility];  Service: Urology;  Laterality: Bilateral;  . TONSILLECTOMY    . TRANSURETHRAL RESECTION OF BLADDER TUMOR N/A 01/26/2015   Procedure: TRANSURETHRAL RESECTION OF BLADDER TUMOR (TURBT);  Surgeon: Cleon Gustin, MD;  [facility];  Service: Urology;  Laterality: N/A;  . TRANSURETHRAL RESECTION OF BLADDER TUMOR WITH GYRUS (TURBT-GYRUS) N/A 03/02/2015   Procedure: TRANSURETHRAL RESECTION OF BLADDER TUMOR WITH GYRUS (TURBT-GYRUS);  Surgeon: Cleon Gustin, MD;  [facility]: [facility];  Service: Urology;  Laterality: N/A;  . TRANSURETHRAL RESECTION OF PROSTATE N/A 01/26/2015   Procedure: TRANSURETHRAL  RESECTION OF THE PROSTATE WITH GYRUS INSTRUMENTS;  Surgeon: Cleon Gustin, MD;  Location: [facility];  Service: Urology;  Laterality: N/A;        Home Medications    Prior to Admission medications   Medication Sig Start Date End Date Taking? Authorizing Provider  ARIPiprazole (ABILIFY) 5 MG tablet Take 5 mg by mouth once.  10/09/15   [provider]  aspirin EC 81 MG tablet Take 81 mg by mouth daily.    [provider]  atorvastatin (LIPITOR) 80 MG tablet Take 80 mg by mouth at bedtime.      [provider]  carbamazepine (TEGRETOL) 200 MG tablet Take 400 mg by mouth 2 (two) times daily.     [provider]  divalproex (DEPAKOTE) 250 MG DR tablet Take 250 mg by mouth 3 (three) times daily.    [provider]  furosemide (LASIX) 40 MG tablet Take 40 mg by mouth daily. May take additional tablet for 3 lbs wt gain over night    [provider]  lisinopril (PRINIVIL,ZESTRIL) 5 MG tablet TAKE (1) TABLET BY MOUTH ONCE DAILY. 06/26/17   [name], MD  LORazepam (ATIVAN) 0.5 MG tablet Take 0.5 mg by mouth at bedtime.    [provider]    Family History Family History  Problem Relation Age of Onset  . Asthma Mother   . Heart attack Father 18  . Diabetes Brother     Social History Social History   Tobacco Use  . Smoking status: Former Smoker    Packs/day: 1.00    Years: 15.00    Pack years: 15.00    Types: Cigarettes    Last attempt to quit: 01/11/1971    Years since quitting: 47.1  . Smokeless tobacco: Never Used  Substance Use Topics  . Alcohol use: No  . Drug use: No     Allergies   Patient has no known allergies.   Review of Systems Review of Systems  Constitutional: Negative for appetite change and fatigue.  HENT: Negative for congestion, ear discharge and sinus pressure.   Eyes: Negative for discharge.  Respiratory: Positive for cough.   Cardiovascular: Negative for chest pain.  Gastrointestinal: Negative for abdominal pain and diarrhea.  Genitourinary: Negative for frequency and hematuria.  Musculoskeletal: Negative for back pain.  Skin: Negative for rash.  Neurological: Negative for seizures and headaches.  Psychiatric/Behavioral: Negative for hallucinations.     Physical Exam Updated Vital Signs BP (!) 119/96   Pulse 67   Temp (!) 97.5 F (36.4 C) (Oral)   Resp 20   Ht 6' (1.829 m)   Wt 105.2 kg   SpO2  100%   BMI 31.46 kg/m   Physical Exam Vitals signs and nursing note reviewed.  Constitutional:      Appearance: He is well-developed.  HENT:     Head: Normocephalic.     Nose: Nose normal.  Eyes:     General: No scleral icterus.    Conjunctiva/sclera: Conjunctivae normal.  Neck:     Musculoskeletal: Neck supple.     Thyroid: No thyromegaly.  Cardiovascular:     Rate and Rhythm: Normal rate and regular rhythm.     Heart sounds: No murmur. No friction rub. No gallop.   Pulmonary:     Breath sounds: No stridor. No wheezing or rales.  Chest:     Chest wall: No tenderness.  Abdominal:     General: There is no distension.  Tenderness: There is no abdominal tenderness. There is no rebound.  Musculoskeletal: Normal range of motion.  Lymphadenopathy:     Cervical: No cervical adenopathy.  Skin:    Findings: No erythema or rash.  Neurological:     Mental Status: He is oriented to person, place, and time.     Motor: No abnormal muscle tone.     Coordination: Coordination normal.  Psychiatric:        Behavior: Behavior normal.      ED Treatments / Results  Labs (all labs ordered are listed, but only abnormal results are displayed) Labs Reviewed - No data to display  EKG None  Radiology Dg Chest 2 View  Result Date: 03/02/2018 CLINICAL DATA:  Pt states he choked on a ham biscuit this morning/hx MI/htn/hx bladder cancer/a fib/asthma/ex smoker/hx by pass surgerycough EXAM: CHEST - 2 VIEW COMPARISON:  06/05/2017 FINDINGS: Sternotomy wires overlie normal cardiac silhouette. Normal pulmonary vasculature. No effusion, infiltrate, or pneumothorax. No acute osseous abnormality. Lungs are hyperinflated. Thoracic spine kyphosis. IMPRESSION: Hyperinflated lungs.  No acute findings. Kyphosis of the spine. Electronically Signed   By: Suzy Bouchard M.D.   On: 03/02/2018 09:29    Procedures Procedures (including critical care time)  Medications Ordered in ED Medications - No  data to display   Initial Impression / Assessment and Plan / ED Course  I have reviewed the triage vital signs and the nursing notes.  Pertinent labs & imaging results that were available during my care of the patient were reviewed by me and considered in my medical decision making (see chart for details).       Feels much better.  Chest x-ray unremarkable.  Patient with choking episode from PAM visit.  He will follow-up with his PCP as needed  Final Clinical Impressions(s) / ED Diagnoses   Final diagnoses:  Choking, initial encounter    ED Discharge Orders    None       Milton Ferguson, MD 03/02/18 1029

## 2018-03-07 ENCOUNTER — Ambulatory Visit: Payer: PPO | Admitting: Urology

## 2018-03-07 DIAGNOSIS — R310 Gross hematuria: Secondary | ICD-10-CM | POA: Diagnosis not present

## 2018-03-14 ENCOUNTER — Other Ambulatory Visit: Payer: Self-pay

## 2018-03-14 ENCOUNTER — Emergency Department (HOSPITAL_COMMUNITY)
Admission: EM | Admit: 2018-03-14 | Discharge: 2018-03-15 | Disposition: A | Discharge status: Home / Self Care | Payer: PPO | Attending: Emergency Medicine | Admitting: Emergency Medicine

## 2018-03-14 ENCOUNTER — Encounter (HOSPITAL_COMMUNITY): Payer: Self-pay | Admitting: Emergency Medicine

## 2018-03-14 ENCOUNTER — Emergency Department (HOSPITAL_COMMUNITY): Payer: PPO

## 2018-03-14 DIAGNOSIS — J45909 Unspecified asthma, uncomplicated: Secondary | ICD-10-CM | POA: Insufficient documentation

## 2018-03-14 DIAGNOSIS — R609 Edema, unspecified: Secondary | ICD-10-CM | POA: Insufficient documentation

## 2018-03-14 DIAGNOSIS — R0600 Dyspnea, unspecified: Secondary | ICD-10-CM | POA: Diagnosis not present

## 2018-03-14 DIAGNOSIS — I252 Old myocardial infarction: Secondary | ICD-10-CM | POA: Diagnosis not present

## 2018-03-14 DIAGNOSIS — Z79899 Other long term (current) drug therapy: Secondary | ICD-10-CM | POA: Diagnosis not present

## 2018-03-14 DIAGNOSIS — R2243 Localized swelling, mass and lump, lower limb, bilateral: Secondary | ICD-10-CM | POA: Diagnosis present

## 2018-03-14 DIAGNOSIS — I10 Essential (primary) hypertension: Secondary | ICD-10-CM | POA: Insufficient documentation

## 2018-03-14 DIAGNOSIS — R600 Localized edema: Secondary | ICD-10-CM | POA: Diagnosis not present

## 2018-03-14 DIAGNOSIS — Z7982 Long term (current) use of aspirin: Secondary | ICD-10-CM | POA: Diagnosis not present

## 2018-03-14 DIAGNOSIS — Z87891 Personal history of nicotine dependence: Secondary | ICD-10-CM | POA: Diagnosis not present

## 2018-03-14 DIAGNOSIS — R0602 Shortness of breath: Secondary | ICD-10-CM | POA: Diagnosis not present

## 2018-03-14 DIAGNOSIS — R319 Hematuria, unspecified: Secondary | ICD-10-CM | POA: Diagnosis not present

## 2018-03-14 LAB — CBC WITH DIFFERENTIAL/PLATELET
ABS IMMATURE GRANULOCYTES: 0.03 10*3/uL (ref 0.00–0.07)
BASOS ABS: 0 10*3/uL (ref 0.0–0.1)
BASOS PCT: 0 %
Eosinophils Absolute: 0.2 10*3/uL (ref 0.0–0.5)
Eosinophils Relative: 2 %
HCT: 34.8 % — ABNORMAL LOW (ref 39.0–52.0)
Hemoglobin: 10.7 g/dL — ABNORMAL LOW (ref 13.0–17.0)
Immature Granulocytes: 0 %
Lymphocytes Relative: 7 %
Lymphs Abs: 0.6 10*3/uL — ABNORMAL LOW (ref 0.7–4.0)
MCH: 29.6 pg (ref 26.0–34.0)
MCHC: 30.7 g/dL (ref 30.0–36.0)
MCV: 96.1 fL (ref 80.0–100.0)
MONOS PCT: 7 %
Monocytes Absolute: 0.6 10*3/uL (ref 0.1–1.0)
NEUTROS ABS: 7.4 10*3/uL (ref 1.7–7.7)
NEUTROS PCT: 84 %
NRBC: 0 % (ref 0.0–0.2)
PLATELETS: 155 10*3/uL (ref 150–400)
RBC: 3.62 MIL/uL — AB (ref 4.22–5.81)
RDW: 13.7 % (ref 11.5–15.5)
WBC: 8.8 10*3/uL (ref 4.0–10.5)

## 2018-03-14 MED ORDER — FUROSEMIDE 10 MG/ML IJ SOLN
80.0000 mg | Freq: Once | INTRAMUSCULAR | Status: AC
  Administered 2018-03-14: 80 mg via INTRAVENOUS
  Filled 2018-03-14: qty 8

## 2018-03-14 NOTE — ED Provider Notes (Signed)
[facility] EMERGENCY DEPARTMENT Provider Note   CSN: [id number] Arrival date & time: 03/14/18  2237    History   Chief Complaint Chief Complaint  Patient presents with  . Leg Swelling    HPI Grant Stevens is a 78 y.o. male.     The history is provided by the patient.  Illness  Location:  Bilateral legs Quality:  Swelling Severity:  Moderate Onset quality:  Gradual Duration:  1 week Timing:  Constant Progression:  Worsening Chronicity:  New Relieved by:  Nothing Worsened by:  Nothing Associated symptoms: shortness of breath   Associated symptoms: no chest pain, no fever and no vomiting   PT With history of CAD, cardiomyopathy presents with leg swelling for a week.  He reports it is in both legs.  He has some pain.  He also is reporting wheezing and shortness of breath.  He reports dyspnea on exertion.  Denies orthopnea Reports recent hematuria and has been on Bactrim for this  Past Medical History:  Diagnosis Date  . Allergic rhinitis   . Arthritis   . Asthma    Childhood  . Atrial fibrillation ([place])    Remote history - WARCEF  . Bladder cancer ([place])   . BPH (benign prostatic hyperplasia)   . Cardiomyopathy ([place])   . Coronary atherosclerosis of native coronary artery    Multivessel s/p CABG in 06/2001 post-IMI, LVEF 35% up to 50% postoperatively;  . Depression   . Difficulty sleeping   . Erectile dysfunction   . Essential hypertension, benign   . Frequency of urination   . History of bipolar disorder   . Hyperlipidemia   . IBS (irritable bowel syndrome)   . Myocardial infarction [name]) 2002    Patient Active Problem List   Diagnosis Date Noted  . BPH (benign prostatic hyperplasia) 01/26/2015  . Dizziness 10/19/2014  . Coronary atherosclerosis of native coronary artery   . History of atrial fibrillation   . Essential hypertension, benign 09/22/2010  . Mixed hyperlipidemia 11/28/2008  . SLEEP APNEA 11/28/2008    Past Surgical History:  Procedure  Laterality Date  . CATARACT EXTRACTION W/PHACO Left 12/29/2014   Procedure: CATARACT EXTRACTION PHACO AND INTRAOCULAR LENS PLACEMENT (IOC);  Surgeon: Williams Che, MD;  Location: [facility] ORS;  Service: Ophthalmology;  Laterality: Left;  CDE:3.71  . CATARACT EXTRACTION W/PHACO Right 03/30/2015   Procedure: CATARACT EXTRACTION PHACO AND INTRAOCULAR LENS PLACEMENT RIGHT EYE CDE=2.56;  Surgeon: Williams Che, MD;  Location: [facility] ORS;  Service: Ophthalmology;  Laterality: Right;  . CIRCUMCISION  10/2003  . COLONOSCOPY N/A 07/11/2012   Procedure: COLONOSCOPY;  Surgeon: Rogene Houston, MD;  Location: [facility] ENDO SUITE;  Service: Endoscopy;  Laterality: N/A;  830-moved to 930 Ann to notify pt  . CORONARY ARTERY BYPASS GRAFT  08/2000   Dr. Servando Snare - LIMA to LAD, SVG to diagonal, SVG to PDA TRIPLE BYPASS  . CYSTOSCOPY W/ RETROGRADES Bilateral 03/02/2015   Procedure: CYSTOSCOPY WITH RIGHT RETROGRADE PYELOGRAM,  ATTEMPTED LEFT RETROGRADE PYELOGRAM;  Surgeon: Cleon Gustin, MD;  Location: [facility] ORS;  Service: Urology;  Laterality: Bilateral;  . TONSILLECTOMY    . TRANSURETHRAL RESECTION OF BLADDER TUMOR N/A 01/26/2015   Procedure: TRANSURETHRAL RESECTION OF BLADDER TUMOR (TURBT);  Surgeon: Cleon Gustin, MD;  Location: [facility] ORS;  Service: Urology;  Laterality: N/A;  . TRANSURETHRAL RESECTION OF BLADDER TUMOR WITH GYRUS (TURBT-GYRUS) N/A 03/02/2015   Procedure: TRANSURETHRAL RESECTION OF BLADDER TUMOR WITH GYRUS (TURBT-GYRUS);  Surgeon: Cleon Gustin,  MD;  Location: [facility] ORS;  Service: Urology;  Laterality: N/A;  . TRANSURETHRAL RESECTION OF PROSTATE N/A 01/26/2015   Procedure: TRANSURETHRAL RESECTION OF THE PROSTATE WITH GYRUS INSTRUMENTS;  Surgeon: Cleon Gustin, MD;  Location: [facility] ORS;  Service: Urology;  Laterality: N/A;        Home Medications    Prior to Admission medications   Medication Sig Start Date End Date Taking? Authorizing Provider  ARIPiprazole (ABILIFY) 5 MG tablet Take 5 mg by  mouth once.  10/09/15   [provider]  aspirin EC 81 MG tablet Take 81 mg by mouth daily.    [provider]  atorvastatin (LIPITOR) 80 MG tablet Take 80 mg by mouth at bedtime.      [provider]  carbamazepine (TEGRETOL) 200 MG tablet Take 400 mg by mouth 2 (two) times daily.     [provider]  divalproex (DEPAKOTE) 250 MG DR tablet Take 250 mg by mouth 3 (three) times daily.    [provider]  furosemide (LASIX) 40 MG tablet Take 40 mg by mouth daily. May take additional tablet for 3 lbs wt gain over night    [provider]  lisinopril (PRINIVIL,ZESTRIL) 5 MG tablet TAKE (1) TABLET BY MOUTH ONCE DAILY. 06/26/17   [name], MD  LORazepam (ATIVAN) 0.5 MG tablet Take 0.5 mg by mouth at bedtime.    [provider]    Family History Family History  Problem Relation Age of Onset  . Asthma Mother   . Heart attack Father 41  . Diabetes Brother     Social History Social History   Tobacco Use  . Smoking status: Former Smoker    Packs/day: 1.00    Years: 15.00    Pack years: 15.00    Types: Cigarettes    Last attempt to quit: 01/11/1971    Years since quitting: 47.2  . Smokeless tobacco: Never Used  Substance Use Topics  . Alcohol use: Yes    Comment: occ.  . Drug use: No     Allergies   Patient has no known allergies.   Review of Systems Review of Systems  Constitutional: Negative for fever.  Respiratory: Positive for shortness of breath.   Cardiovascular: Positive for leg swelling. Negative for chest pain.  Gastrointestinal: Negative for anal bleeding, blood in stool and vomiting.  Genitourinary: Positive for hematuria.  All other systems reviewed and are negative.    Physical Exam Updated Vital Signs BP (!) 146/63 (BP Location: Right Arm)   Pulse 89   Temp 97.6 F (36.4 C) (Oral)   Resp 16   Ht 1.829 m (6')   Wt 109 kg   SpO2 96%   BMI 32.59 kg/m   Physical Exam CONSTITUTIONAL:  Well developed/well nourished HEAD: Normocephalic/atraumatic EYES: EOMI/PERRL ENMT: Mucous membranes moist NECK: supple no meningeal signs, +JVD SPINE/BACK:entire spine nontender CV: S1/S2 noted, no murmurs/rubs/gallops noted LUNGS: Lungs are clear to auscultation bilaterally, no apparent distress ABDOMEN: soft, nontender, no rebound or guarding, bowel sounds noted throughout abdomen GU:no cva tenderness NEURO: Pt is awake/alert/appropriate, moves all extremitiesx4.  No facial droop.   EXTREMITIES: pulses normal/equal, full ROM, symmetric LE pitting edema, no tenderness, no significant erythema, no tenderness, no crepitus SKIN: warm, color normal PSYCH: no abnormalities of mood noted, alert and oriented to situation  ED Treatments / Results  Labs (all labs ordered are listed, but only abnormal results are displayed) Labs Reviewed  BASIC METABOLIC PANEL - Abnormal; Notable for  the following components:      Result Value   Sodium 133 (*)    Glucose, Bld 106 (*)    BUN 24 (*)    Calcium 8.3 (*)    GFR calc non Af Amer 57 (*)    All other components within normal limits  CBC WITH DIFFERENTIAL/PLATELET - Abnormal; Notable for the following components:   RBC 3.62 (*)    Hemoglobin 10.7 (*)    HCT 34.8 (*)    Lymphs Abs 0.6 (*)    All other components within normal limits    EKG None  Radiology Dg Chest 2 View  Result Date: 03/15/2018 CLINICAL DATA:  Bilateral lower leg swelling and dyspnea x2 days. EXAM: CHEST - 2 VIEW COMPARISON:  03/02/2018 FINDINGS: Low lung volumes. Stable cardiomegaly. Aortic atherosclerosis without aneurysm. The patient is status post median sternotomy. Remote left-sided seventh rib fracture is identified. No effusion or pneumothorax. IMPRESSION: 1. Low lung volumes without active pulmonary disease. 2. Stable borderline cardiomegaly with aortic atherosclerosis. 3. Remote left posterior seventh rib fracture. Electronically Signed   By: Ashley Royalty M.D.   On:  03/15/2018 00:08    Procedures Procedures    Medications Ordered in ED Medications  potassium chloride SA (K-DUR,KLOR-CON) CR tablet 40 mEq (has no administration in time range)  furosemide (LASIX) injection 80 mg (80 mg Intravenous Given 03/14/18 2341)     Initial Impression / Assessment and Plan / ED Course  I have reviewed the triage vital signs and the nursing notes.  Pertinent labs & imaging results that were available during my care of the patient were reviewed by me and considered in my medical decision making (see chart for details).        12:28 AM Patient with known history of cardiomyopathy presents with leg swelling for a week.  Fortunately his chest x-ray is negative for any peripheral edema.  He had mentioned shortness of breath, but is in no acute distress, no hypoxia Plan will be to diurese patient.  I feel he is appropriate discharge home.  He will need to increase his Lasix to twice daily the next several days to improve diuresis 1:07 AM Patient feels improved.  He had  over 800 mL's of urine output. He feels improved. Will be to increase his Lasix to twice daily dosing for 5 days.  We will also supplement potassium daily while taking the extra Lasix to prevent hypokalemia.  He will follow-up with his primary doctor in a week as he reports he  has been having some difficulty swallowing recently and needs to have this evaluated Final Clinical Impressions(s) / ED Diagnoses   Final diagnoses:  Peripheral edema    ED Discharge Orders         Ordered    potassium chloride SA (K-DUR,KLOR-CON) 20 MEQ tablet  Daily     03/15/18 0103           Ripley Fraise, MD 03/15/18 [phone]

## 2018-03-14 NOTE — ED Triage Notes (Signed)
Pt c/o bilateral leg swelling for about 2 days. Pt was seen by urologist on 2/26 and started on tamulosin.

## 2018-03-15 LAB — BASIC METABOLIC PANEL
Anion gap: 7 (ref 5–15)
BUN: 24 mg/dL — AB (ref 8–23)
CO2: 26 mmol/L (ref 22–32)
Calcium: 8.3 mg/dL — ABNORMAL LOW (ref 8.9–10.3)
Chloride: 100 mmol/L (ref 98–111)
Creatinine, Ser: 1.21 mg/dL (ref 0.61–1.24)
GFR calc Af Amer: >60 mL/min (ref >60)
GFR, EST NON AFRICAN AMERICAN: 57 mL/min — AB (ref >60)
GLUCOSE: 106 mg/dL — AB (ref 70–99)
POTASSIUM: 3.9 mmol/L (ref 3.5–5.1)
Sodium: 133 mmol/L — ABNORMAL LOW (ref 135–145)

## 2018-03-15 MED ORDER — POTASSIUM CHLORIDE CRYS ER 20 MEQ PO TBCR
40.0000 meq | EXTENDED_RELEASE_TABLET | Freq: Once | ORAL | Status: AC
  Administered 2018-03-15: 40 meq via ORAL
  Filled 2018-03-15: qty 2

## 2018-03-15 MED ORDER — POTASSIUM CHLORIDE CRYS ER 20 MEQ PO TBCR: 20.0000 meq | EXTENDED_RELEASE_TABLET | Freq: Every day | ORAL | 0 refills | Status: DC

## 2018-03-19 ENCOUNTER — Ambulatory Visit: Payer: Self-pay | Admitting: Orthopedic Surgery

## 2018-03-19 DIAGNOSIS — M25561 Pain in right knee: Secondary | ICD-10-CM | POA: Diagnosis not present

## 2018-03-19 DIAGNOSIS — R609 Edema, unspecified: Secondary | ICD-10-CM | POA: Diagnosis not present

## 2018-03-19 DIAGNOSIS — R1310 Dysphagia, unspecified: Secondary | ICD-10-CM | POA: Diagnosis not present

## 2018-03-20 ENCOUNTER — Other Ambulatory Visit (INDEPENDENT_AMBULATORY_CARE_PROVIDER_SITE_OTHER): Payer: Self-pay | Admitting: *Deleted

## 2018-03-20 ENCOUNTER — Telehealth (INDEPENDENT_AMBULATORY_CARE_PROVIDER_SITE_OTHER): Payer: Self-pay | Admitting: *Deleted

## 2018-03-20 DIAGNOSIS — T18128A Food in esophagus causing other injury, initial encounter: Secondary | ICD-10-CM | POA: Insufficient documentation

## 2018-03-20 NOTE — Telephone Encounter (Signed)
Per Dr.Rehman the patient will need to have EGD/ED with conscious sedation. Food Impaction.

## 2018-03-20 NOTE — Telephone Encounter (Signed)
EGD/ED sch'd 03/28/18 at 120, left detailed message for patient

## 2018-03-24 ENCOUNTER — Other Ambulatory Visit: Payer: Self-pay

## 2018-03-24 ENCOUNTER — Emergency Department (HOSPITAL_COMMUNITY)
Admission: EM | Admit: 2018-03-24 | Discharge: 2018-03-24 | Disposition: A | Discharge status: Home / Self Care | Payer: PPO | Attending: Emergency Medicine | Admitting: Emergency Medicine

## 2018-03-24 ENCOUNTER — Encounter (HOSPITAL_COMMUNITY): Payer: Self-pay | Admitting: Emergency Medicine

## 2018-03-24 DIAGNOSIS — J45909 Unspecified asthma, uncomplicated: Secondary | ICD-10-CM | POA: Diagnosis not present

## 2018-03-24 DIAGNOSIS — Z79899 Other long term (current) drug therapy: Secondary | ICD-10-CM | POA: Insufficient documentation

## 2018-03-24 DIAGNOSIS — I10 Essential (primary) hypertension: Secondary | ICD-10-CM | POA: Insufficient documentation

## 2018-03-24 DIAGNOSIS — R609 Edema, unspecified: Secondary | ICD-10-CM

## 2018-03-24 DIAGNOSIS — Z7982 Long term (current) use of aspirin: Secondary | ICD-10-CM | POA: Insufficient documentation

## 2018-03-24 DIAGNOSIS — R600 Localized edema: Secondary | ICD-10-CM | POA: Diagnosis not present

## 2018-03-24 DIAGNOSIS — Z87891 Personal history of nicotine dependence: Secondary | ICD-10-CM | POA: Diagnosis not present

## 2018-03-24 LAB — CBC WITH DIFFERENTIAL/PLATELET
Abs Immature Granulocytes: 0.02 10*3/uL (ref 0.00–0.07)
Basophils Absolute: 0 10*3/uL (ref 0.0–0.1)
Basophils Relative: 1 %
Eosinophils Absolute: 0.3 10*3/uL (ref 0.0–0.5)
Eosinophils Relative: 3 %
HCT: 36.6 % — ABNORMAL LOW (ref 39.0–52.0)
Hemoglobin: 11.6 g/dL — ABNORMAL LOW (ref 13.0–17.0)
Immature Granulocytes: 0 %
Lymphocytes Relative: 9 %
Lymphs Abs: 0.6 10*3/uL — ABNORMAL LOW (ref 0.7–4.0)
MCH: 30.4 pg (ref 26.0–34.0)
MCHC: 31.7 g/dL (ref 30.0–36.0)
MCV: 96.1 fL (ref 80.0–100.0)
Monocytes Absolute: 0.5 10*3/uL (ref 0.1–1.0)
Monocytes Relative: 7 %
NEUTROS ABS: 6.1 10*3/uL (ref 1.7–7.7)
Neutrophils Relative %: 80 %
Platelets: 163 10*3/uL (ref 150–400)
RBC: 3.81 MIL/uL — ABNORMAL LOW (ref 4.22–5.81)
RDW: 13.3 % (ref 11.5–15.5)
WBC: 7.6 10*3/uL (ref 4.0–10.5)
nRBC: 0 % (ref 0.0–0.2)

## 2018-03-24 LAB — BASIC METABOLIC PANEL
ANION GAP: 9 (ref 5–15)
BUN: 19 mg/dL (ref 8–23)
CO2: 28 mmol/L (ref 22–32)
Calcium: 8.9 mg/dL (ref 8.9–10.3)
Chloride: 96 mmol/L — ABNORMAL LOW (ref 98–111)
Creatinine, Ser: 1.13 mg/dL (ref 0.61–1.24)
GFR calc Af Amer: >60 mL/min (ref >60)
GFR calc non Af Amer: >60 mL/min (ref >60)
GLUCOSE: 90 mg/dL (ref 70–99)
Potassium: 4 mmol/L (ref 3.5–5.1)
Sodium: 133 mmol/L — ABNORMAL LOW (ref 135–145)

## 2018-03-24 LAB — BRAIN NATRIURETIC PEPTIDE: B Natriuretic Peptide: 142.3 pg/mL — ABNORMAL HIGH (ref 0.0–100.0)

## 2018-03-24 MED ORDER — FUROSEMIDE 10 MG/ML IJ SOLN
40.0000 mg | Freq: Once | INTRAMUSCULAR | Status: AC
  Administered 2018-03-24: 40 mg via INTRAVENOUS
  Filled 2018-03-24: qty 4

## 2018-03-24 NOTE — ED Triage Notes (Addendum)
Pt seen at [facility] 10 days ago for R leg swelling.  Was instructed to increase lasix for 5 days and take potassium.  C/o increased swelling to R leg and R leg pain.

## 2018-03-24 NOTE — ED Provider Notes (Signed)
[facility] EMERGENCY DEPARTMENT Provider Note  CSN: [id number] Arrival date & time: 03/24/18 0006  Chief Complaint(s) Leg Swelling  HPI Grant Stevens is a 78 y.o. male extensive past medical history listed below including congestive heart failure with an EF of 30 to 35% in October 2018 who presents to the emergency department with several days of gradually worsening bilateral lower extremity peripheral edema.  Patient was seen for the same last week, treated with IV Lasix and discharged home to follow-up with PCP.  Patient followed up and states that his PCP told to take twice a day Lasix which he has been compliant with.  He denies any associated chest pain or shortness of breath.   He is endorsing associated right lower extremity pain.  States RLE is always slightly larger than left.  Reports that he was on his feet a lot today.  HPI  Past Medical History Past Medical History:  Diagnosis Date  . Allergic rhinitis   . Arthritis   . Asthma    Childhood  . Atrial fibrillation ([place])    Remote history - WARCEF  . Bladder cancer ([place])   . BPH (benign prostatic hyperplasia)   . Cardiomyopathy ([place])   . Coronary atherosclerosis of native coronary artery    Multivessel s/p CABG in 06/2001 post-IMI, LVEF 35% up to 50% postoperatively;  . Depression   . Difficulty sleeping   . Erectile dysfunction   . Essential hypertension, benign   . Frequency of urination   . History of bipolar disorder   . Hyperlipidemia   . IBS (irritable bowel syndrome)   . Myocardial infarction [facility]) 2002   Patient Active Problem List   Diagnosis Date Noted  . Food impaction of esophagus 03/20/2018  . BPH (benign prostatic hyperplasia) 01/26/2015  . Dizziness 10/19/2014  . Coronary atherosclerosis of native coronary artery   . History of atrial fibrillation   . Essential hypertension, benign 09/22/2010  . Mixed hyperlipidemia 11/28/2008  . SLEEP APNEA 11/28/2008   Home  Medication(s) Prior to Admission medications   Medication Sig Start Date End Date Taking? Authorizing Provider  albuterol (PROVENTIL HFA;VENTOLIN HFA) 108 (90 Base) MCG/ACT inhaler Inhale 2 puffs into the lungs every 6 (six) hours as needed for wheezing or shortness of breath.    [provider]  ARIPiprazole (ABILIFY) 5 MG tablet Take 5 mg by mouth daily.  10/09/15   [provider]  aspirin EC 81 MG tablet Take 81 mg by mouth daily.    [provider]  atorvastatin (LIPITOR) 80 MG tablet Take 80 mg by mouth at bedtime.      [provider]  carbamazepine (TEGRETOL) 200 MG tablet Take 400 mg by mouth 2 (two) times daily.     [provider]  divalproex (DEPAKOTE ER) 250 MG 24 hr tablet Take 500 mg by mouth every morning. 03/12/18   [provider]  furosemide (LASIX) 40 MG tablet Take 40 mg by mouth daily. May take additional tablet for 3 lbs wt gain over night    [provider]  lisinopril (PRINIVIL,ZESTRIL) 5 MG tablet TAKE (1) TABLET BY MOUTH ONCE DAILY. Patient taking differently: Take 5 mg by mouth daily.  06/26/17   [name], MD  LORazepam (ATIVAN) 0.5 MG tablet Take 0.5 mg by mouth at bedtime.    [provider]  potassium chloride SA (K-DUR,KLOR-CON) 20 MEQ tablet Take 1 tablet (20 mEq total) by mouth daily. Patient not taking: Reported on  03/21/2018 03/15/18   Ripley Fraise, MD  Psyllium (METAMUCIL FIBER PO) Take 2 capsules by mouth daily.    [provider]  tamsulosin (FLOMAX) 0.4 MG CAPS capsule Take 0.4 mg by mouth at bedtime.    [provider]  temazepam (RESTORIL) 15 MG capsule Take 15 mg by mouth at bedtime as needed for sleep.    [provider]                                                                                                                                    Past Surgical History Past Surgical History:  Procedure Laterality Date  . CATARACT EXTRACTION  W/PHACO Left 12/29/2014   Procedure: CATARACT EXTRACTION PHACO AND INTRAOCULAR LENS PLACEMENT (IOC);  Surgeon: Williams Che, MD;  Location: [facility] ORS;  Service: Ophthalmology;  Laterality: Left;  CDE:3.71  . CATARACT EXTRACTION W/PHACO Right 03/30/2015   Procedure: CATARACT EXTRACTION PHACO AND INTRAOCULAR LENS PLACEMENT RIGHT EYE CDE=2.56;  Surgeon: Williams Che, MD;  Location: [facility] ORS;  Service: Ophthalmology;  Laterality: Right;  . CIRCUMCISION  10/2003  . COLONOSCOPY N/A 07/11/2012   Procedure: COLONOSCOPY;  Surgeon: Rogene Houston, MD;  Location: [facility] ENDO SUITE;  Service: Endoscopy;  Laterality: N/A;  830-moved to 930 Ann to notify pt  . CORONARY ARTERY BYPASS GRAFT  08/2000   Dr. Servando Snare - LIMA to LAD, SVG to diagonal, SVG to PDA TRIPLE BYPASS  . CYSTOSCOPY W/ RETROGRADES Bilateral 03/02/2015   Procedure: CYSTOSCOPY WITH RIGHT RETROGRADE PYELOGRAM,  ATTEMPTED LEFT RETROGRADE PYELOGRAM;  Surgeon: Cleon Gustin, MD;  Location: [facility];  Service: Urology;  Laterality: Bilateral;  . TONSILLECTOMY    . TRANSURETHRAL RESECTION OF BLADDER TUMOR N/A 01/26/2015   Procedure: TRANSURETHRAL RESECTION OF BLADDER TUMOR (TURBT);  Surgeon: Cleon Gustin, MD;  Location: [facility];  Service: Urology;  Laterality: N/A;  . TRANSURETHRAL RESECTION OF BLADDER TUMOR WITH GYRUS (TURBT-GYRUS) N/A 03/02/2015   Procedure: TRANSURETHRAL RESECTION OF BLADDER TUMOR WITH GYRUS (TURBT-GYRUS);  Surgeon: Cleon Gustin, MD;  Location: [facility]S;  Service: Urology;  Laterality: N/A;  . TRANSURETHRAL RESECTION OF PROSTATE N/A 01/26/2015   Procedure: TRANSURETHRAL RESECTION OF THE PROSTATE WITH GYRUS INSTRUMENTS;  Surgeon: Cleon Gustin, MD;  Location: [facility]S;  Service: Urology;  Laterality: N/A;   Family History Family History  Problem Relation Age of Onset  . Asthma Mother   . Heart attack Father 19  . Diabetes Brother     Social History Social History   Tobacco Use  . Smoking status: Former Smoker     Packs/day: 1.00    Years: 15.00    Pack years: 15.00    Types: Cigarettes    Last attempt to quit: 01/11/1971    Years since quitting: 47.2  . Smokeless tobacco: Never Used  Substance Use Topics  . Alcohol use: Yes    Comment: occ.  . Drug use: No  Allergies Patient has no known allergies.  Review of Systems Review of Systems All other systems are reviewed and are negative for acute change except as noted in the HPI  Physical Exam Vital Signs  I have reviewed the triage vital signs BP (!) 146/72   Pulse 66   Temp 98.4 F (36.9 C) (Oral)   Resp 13   Ht 6' (1.829 m)   Wt 108.9 kg   SpO2 99%   BMI 32.55 kg/m   Physical Exam Vitals signs reviewed.  Constitutional:      General: He is not in acute distress.    Appearance: He is well-developed. He is not diaphoretic.  HENT:     Head: Normocephalic and atraumatic.     Jaw: No trismus.     Right Ear: External ear normal.     Left Ear: External ear normal.     Nose: Nose normal.  Eyes:     General: No scleral icterus.       Right eye: No discharge.        Left eye: No discharge.     Conjunctiva/sclera: Conjunctivae normal.     Pupils: Pupils are equal, round, and reactive to light.  Neck:     Musculoskeletal: Normal range of motion and neck supple.     Trachea: Phonation normal.  Cardiovascular:     Rate and Rhythm: Normal rate and regular rhythm.     Heart sounds: No murmur. No friction rub. No gallop.   Pulmonary:     Effort: Pulmonary effort is normal. No respiratory distress.     Breath sounds: Normal breath sounds. No stridor. No rales.  Abdominal:     General: There is no distension.     Palpations: Abdomen is soft.     Tenderness: There is no abdominal tenderness.  Musculoskeletal: Normal range of motion.        General: No tenderness.     Comments: 2+ bilateral lower extremity edema.  Right slightly greater than left; though left has compression stockings in place. Remote surgical scar to medial aspect  of right lower extremity.  Skin:    General: Skin is warm and dry.     Findings: No erythema or rash.  Neurological:     Mental Status: He is alert and oriented to person, place, and time.  Psychiatric:        Behavior: Behavior normal.     ED Results and Treatments Labs (all labs ordered are listed, but only abnormal results are displayed) Labs Reviewed  CBC WITH DIFFERENTIAL/PLATELET - Abnormal; Notable for the following components:      Result Value   RBC 3.81 (*)    Hemoglobin 11.6 (*)    HCT 36.6 (*)    Lymphs Abs 0.6 (*)    All other components within normal limits  BASIC METABOLIC PANEL - Abnormal; Notable for the following components:   Sodium 133 (*)    Chloride 96 (*)    All other components within normal limits  BRAIN NATRIURETIC PEPTIDE - Abnormal; Notable for the following components:   B Natriuretic Peptide 142.3 (*)    All other components within normal limits  EKG  EKG Interpretation  Date/Time:    Ventricular Rate:    PR Interval:    QRS Duration:   QT Interval:    QTC Calculation:   R Axis:     Text Interpretation:        Radiology No results found. Pertinent labs & imaging results that were available during my care of the patient were reviewed by me and considered in my medical decision making (see chart for details).  Medications Ordered in ED Medications  furosemide (LASIX) injection 40 mg (40 mg Intravenous Given 03/24/18 0509)                                                                                                                                    Procedures Procedures  (including critical care time)  Medical Decision Making / ED Course I have reviewed the nursing notes for this encounter and the patient's prior records (if available in EHR or on provided paperwork).    Patient presents with bilateral lower  extremity edema.  He denies any respiratory distress or associated chest pain.  He is endorsing usual pain from edema.  Labs reassuring.  Provided with 40 mg of IV Lasix resulting in good diuresing.  Patient reports that after this his swelling has improved and his pain has resolved.  Doubt DVT. No consistent with arterial occlusion. No evidence of infection.  The patient appears reasonably screened and/or stabilized for discharge and I doubt any other medical condition or other [facility] requiring further screening, evaluation, or treatment in the ED at this time prior to discharge.  The patient is safe for discharge with strict return precautions.   Final Clinical Impression(s) / ED Diagnoses Final diagnoses:  Peripheral edema   Disposition: Discharge  Condition: Good  I have discussed the results, Dx and Tx plan with the patient who expressed understanding and agree(s) with the plan. Discharge instructions discussed at great length. The patient was given strict return precautions who verbalized understanding of the instructions. No further questions at time of discharge.    ED Discharge Orders    None       Follow Up: [name], MD [address] [place] [address] [phone]  On 03/26/2018 As scheduled      This chart was dictated using voice recognition software.  Despite best efforts to proofread,  errors can occur which can change the documentation meaning.   Fatima Blank, MD 03/24/18 [phone]

## 2018-03-26 ENCOUNTER — Ambulatory Visit: Payer: PPO | Admitting: Orthopedic Surgery

## 2018-03-26 ENCOUNTER — Other Ambulatory Visit: Payer: Self-pay

## 2018-03-26 ENCOUNTER — Encounter: Payer: Self-pay | Admitting: Orthopedic Surgery

## 2018-03-26 ENCOUNTER — Ambulatory Visit (INDEPENDENT_AMBULATORY_CARE_PROVIDER_SITE_OTHER): Payer: PPO

## 2018-03-26 VITALS — BP 111/65 | HR 76 | Ht 72.0 in | Wt 241.0 lb

## 2018-03-26 DIAGNOSIS — R609 Edema, unspecified: Secondary | ICD-10-CM | POA: Diagnosis not present

## 2018-03-26 DIAGNOSIS — M25561 Pain in right knee: Secondary | ICD-10-CM

## 2018-03-26 DIAGNOSIS — G8929 Other chronic pain: Secondary | ICD-10-CM | POA: Diagnosis not present

## 2018-03-26 DIAGNOSIS — Z6834 Body mass index (BMI) 34.0-34.9, adult: Secondary | ICD-10-CM | POA: Diagnosis not present

## 2018-03-26 DIAGNOSIS — M1711 Unilateral primary osteoarthritis, right knee: Secondary | ICD-10-CM | POA: Diagnosis not present

## 2018-03-26 DIAGNOSIS — I5022 Chronic systolic (congestive) heart failure: Secondary | ICD-10-CM | POA: Diagnosis not present

## 2018-03-26 NOTE — Progress Notes (Signed)
ESTABLISHED PATIENT NEW PROBLEM OFFICE VISIT  Chief Complaint  Patient presents with  . Knee Pain    right knee pain x 3 weeks had problems 2014 injection helped     78 year old male with a history of coronary artery bypass, congestive heart failure, currently treated for bilateral leg edema presents with 3-week history of right knee pain.  He was seen in our office in 2014 had a cortisone injection which gave him significant relief until about 3 weeks ago.  Complains of diffuse knee pain and swelling pain with walking pain with going up and down the steps which is best described as a dull ache.  Mechanical symptoms none   Review of Systems  Constitutional: Negative for fever.  Respiratory: Negative for shortness of breath.   Cardiovascular: Positive for leg swelling. Negative for chest pain.  Neurological: Negative for tingling, tremors, focal weakness and weakness.     Past Medical History:  Diagnosis Date  . Allergic rhinitis   . Arthritis   . Asthma    Childhood  . Atrial fibrillation ([place])    Remote history - WARCEF  . Bladder cancer ([place])   . BPH (benign prostatic hyperplasia)   . Cardiomyopathy ([place])   . Coronary atherosclerosis of native coronary artery    Multivessel s/p CABG in 06/2001 post-IMI, LVEF 35% up to 50% postoperatively;  . Depression   . Difficulty sleeping   . Erectile dysfunction   . Essential hypertension, benign   . Frequency of urination   . History of bipolar disorder   . Hyperlipidemia   . IBS (irritable bowel syndrome)   . Myocardial infarction [facility]) 2002    Past Surgical History:  Procedure Laterality Date  . CATARACT EXTRACTION W/PHACO Left 12/29/2014   Procedure: CATARACT EXTRACTION PHACO AND INTRAOCULAR LENS PLACEMENT (IOC);  Surgeon: Williams Che, MD;  Location: [facility];  Service: Ophthalmology;  Laterality: Left;  CDE:3.71  . CATARACT EXTRACTION W/PHACO Right 03/30/2015   Procedure: CATARACT EXTRACTION PHACO AND INTRAOCULAR LENS  PLACEMENT RIGHT EYE CDE=2.56;  Surgeon: Williams Che, MD;  Location: [facility];  Service: Ophthalmology;  Laterality: Right;  . CIRCUMCISION  10/2003  . COLONOSCOPY N/A 07/11/2012   Procedure: COLONOSCOPY;  Surgeon: Rogene Houston, MD;  Location: [facility];  Service: Endoscopy;  Laterality: N/A;  830-moved to 930 Ann to notify pt  . CORONARY ARTERY BYPASS GRAFT  08/2000   Dr. Servando Snare - LIMA to LAD, SVG to diagonal, SVG to PDA TRIPLE BYPASS  . CYSTOSCOPY W/ RETROGRADES Bilateral 03/02/2015   Procedure: CYSTOSCOPY WITH RIGHT RETROGRADE PYELOGRAM,  ATTEMPTED LEFT RETROGRADE PYELOGRAM;  Surgeon: Cleon Gustin, MD;  Location: [facility];  Service: Urology;  Laterality: Bilateral;  . TONSILLECTOMY    . TRANSURETHRAL RESECTION OF BLADDER TUMOR N/A 01/26/2015   Procedure: TRANSURETHRAL RESECTION OF BLADDER TUMOR (TURBT);  Surgeon: Cleon Gustin, MD;  [facility];  Service: Urology;  Laterality: N/A;  . TRANSURETHRAL RESECTION OF BLADDER TUMOR WITH GYRUS (TURBT-GYRUS) N/A 03/02/2015   Procedure: TRANSURETHRAL RESECTION OF BLADDER TUMOR WITH GYRUS (TURBT-GYRUS);  Surgeon: Cleon Gustin, MD;  [facility];  Service: Urology;  Laterality: N/A;  . TRANSURETHRAL RESECTION OF PROSTATE N/A 01/26/2015   Procedure: TRANSURETHRAL RESECTION OF THE PROSTATE WITH GYRUS INSTRUMENTS;  Surgeon: Cleon Gustin, MD;  [facility];  Service: Urology;  Laterality: N/A;    Family History  Problem Relation Age of Onset  . Asthma Mother   . Heart attack Father 9  .  Diabetes Brother    Social History   Tobacco Use  . Smoking status: Former Smoker    Packs/day: 1.00    Years: 15.00    Pack years: 15.00    Types: Cigarettes    Last attempt to quit: 01/11/1971    Years since quitting: 47.2  . Smokeless tobacco: Never Used  Substance Use Topics  . Alcohol use: Yes    Comment: occ.  . Drug use: No    No Known Allergies  Current Meds  Medication Sig  . albuterol (PROVENTIL  HFA;VENTOLIN HFA) 108 (90 Base) MCG/ACT inhaler Inhale 2 puffs into the lungs every 6 (six) hours as needed for wheezing or shortness of breath.  . ARIPiprazole (ABILIFY) 5 MG tablet Take 5 mg by mouth daily.   Marland Kitchen atorvastatin (LIPITOR) 80 MG tablet Take 80 mg by mouth at bedtime.    . carbamazepine (TEGRETOL) 200 MG tablet Take 400 mg by mouth 2 (two) times daily.   . divalproex (DEPAKOTE ER) 250 MG 24 hr tablet Take 500 mg by mouth every morning.  . furosemide (LASIX) 40 MG tablet Take 40 mg by mouth daily. May take additional tablet for 3 lbs wt gain over night  . lisinopril (PRINIVIL,ZESTRIL) 5 MG tablet TAKE (1) TABLET BY MOUTH ONCE DAILY. (Patient taking differently: Take 5 mg by mouth daily. )  . LORazepam (ATIVAN) 0.5 MG tablet Take 0.5 mg by mouth at bedtime.  . tamsulosin (FLOMAX) 0.4 MG CAPS capsule Take 0.4 mg by mouth at bedtime.  . temazepam (RESTORIL) 15 MG capsule Take 15 mg by mouth at bedtime as needed for sleep.    BP 111/65   Pulse 76   Ht 6' (1.829 m)   Wt 241 lb (109.3 kg)   BMI 32.69 kg/m   Physical Exam Vitals signs and nursing note reviewed.  Constitutional:      Appearance: Normal appearance.  Musculoskeletal:     Right knee: He exhibits effusion.     Left knee: He exhibits effusion.  Neurological:     Mental Status: He is alert and oriented to person, place, and time.  Psychiatric:        Mood and Affect: Mood normal.     Right Knee Exam   Muscle Strength  The patient has normal right knee strength.  Tenderness  The patient is experiencing no tenderness.   Range of Motion  Extension: normal  Flexion: normal   Tests  McMurray:  Medial - negative Lateral - negative Varus: negative Valgus: negative Drawer:  Anterior - negative    Posterior - negative  Other  Erythema: absent Scars: absent Sensation: normal Pulse: present Swelling: none Effusion: effusion present   Left Knee Exam   Muscle Strength  The patient has normal left knee  strength.  Tenderness  The patient is experiencing no tenderness.   Range of Motion  Extension: normal  Flexion: normal   Tests  McMurray:  Medial - negative Lateral - negative Varus: negative Valgus: negative Drawer:  Anterior - negative     Posterior - negative  Other  Erythema: absent Scars: absent Sensation: normal Pulse: present Swelling: none Effusion: effusion present        MEDICAL DECISION SECTION  Xrays were done at [place]  My independent reading of xrays:  The x-ray in 2014 showed a lot more cartilage left on the medial side of the knee now he is trending towards a varus knee with bone-on-bone medial side with some sclerosis in the  subchondral bone  Encounter Diagnoses  Name Primary?  . Chronic pain of right knee   . Primary osteoarthritis of right knee Yes    PLAN:   Based on age and medical history current situation recommend nonoperative treatment Procedure note right knee injection verbal consent was obtained to inject right knee joint  Timeout was completed to confirm the site of injection  The medications used were 40 mg of Depo-Medrol and 1% lidocaine 3 cc  Anesthesia was provided by ethyl chloride and the skin was prepped with alcohol.  After cleaning the skin with alcohol a 20-gauge needle was used to inject the right knee joint. There were no complications. A sterile bandage was applied.    No orders of the defined types were placed in this encounter.  Fu as needed Arther Abbott, MD  03/26/2018 10:30 AM

## 2018-03-26 NOTE — Patient Instructions (Signed)

## 2018-03-28 ENCOUNTER — Ambulatory Visit (HOSPITAL_COMMUNITY)
Admission: RE | Admit: 2018-03-28 | Discharge: 2018-03-28 | Disposition: A | Discharge status: Home / Self Care | Payer: PPO | Attending: Internal Medicine | Admitting: Internal Medicine

## 2018-03-28 ENCOUNTER — Encounter (HOSPITAL_COMMUNITY)
Admission: RE | Disposition: A | Discharge status: Home / Self Care | Payer: Self-pay | Source: Home / Self Care | Attending: Internal Medicine

## 2018-03-28 ENCOUNTER — Other Ambulatory Visit: Payer: Self-pay

## 2018-03-28 ENCOUNTER — Encounter (HOSPITAL_COMMUNITY): Payer: Self-pay

## 2018-03-28 DIAGNOSIS — G479 Sleep disorder, unspecified: Secondary | ICD-10-CM | POA: Diagnosis not present

## 2018-03-28 DIAGNOSIS — Z87891 Personal history of nicotine dependence: Secondary | ICD-10-CM | POA: Diagnosis not present

## 2018-03-28 DIAGNOSIS — Z8551 Personal history of malignant neoplasm of bladder: Secondary | ICD-10-CM | POA: Diagnosis not present

## 2018-03-28 DIAGNOSIS — I509 Heart failure, unspecified: Secondary | ICD-10-CM | POA: Insufficient documentation

## 2018-03-28 DIAGNOSIS — K2270 Barrett's esophagus without dysplasia: Secondary | ICD-10-CM | POA: Insufficient documentation

## 2018-03-28 DIAGNOSIS — F319 Bipolar disorder, unspecified: Secondary | ICD-10-CM | POA: Insufficient documentation

## 2018-03-28 DIAGNOSIS — R350 Frequency of micturition: Secondary | ICD-10-CM | POA: Insufficient documentation

## 2018-03-28 DIAGNOSIS — K259 Gastric ulcer, unspecified as acute or chronic, without hemorrhage or perforation: Secondary | ICD-10-CM | POA: Insufficient documentation

## 2018-03-28 DIAGNOSIS — Z951 Presence of aortocoronary bypass graft: Secondary | ICD-10-CM | POA: Insufficient documentation

## 2018-03-28 DIAGNOSIS — K3189 Other diseases of stomach and duodenum: Secondary | ICD-10-CM | POA: Diagnosis not present

## 2018-03-28 DIAGNOSIS — K222 Esophageal obstruction: Secondary | ICD-10-CM | POA: Insufficient documentation

## 2018-03-28 DIAGNOSIS — T18128A Food in esophagus causing other injury, initial encounter: Secondary | ICD-10-CM | POA: Insufficient documentation

## 2018-03-28 DIAGNOSIS — E785 Hyperlipidemia, unspecified: Secondary | ICD-10-CM | POA: Diagnosis not present

## 2018-03-28 DIAGNOSIS — R1310 Dysphagia, unspecified: Secondary | ICD-10-CM | POA: Diagnosis present

## 2018-03-28 DIAGNOSIS — J45909 Unspecified asthma, uncomplicated: Secondary | ICD-10-CM | POA: Diagnosis not present

## 2018-03-28 DIAGNOSIS — K2990 Gastroduodenitis, unspecified, without bleeding: Secondary | ICD-10-CM

## 2018-03-28 DIAGNOSIS — I252 Old myocardial infarction: Secondary | ICD-10-CM | POA: Insufficient documentation

## 2018-03-28 DIAGNOSIS — Z79899 Other long term (current) drug therapy: Secondary | ICD-10-CM | POA: Insufficient documentation

## 2018-03-28 DIAGNOSIS — K269 Duodenal ulcer, unspecified as acute or chronic, without hemorrhage or perforation: Secondary | ICD-10-CM | POA: Diagnosis not present

## 2018-03-28 DIAGNOSIS — Z7982 Long term (current) use of aspirin: Secondary | ICD-10-CM | POA: Diagnosis not present

## 2018-03-28 DIAGNOSIS — K449 Diaphragmatic hernia without obstruction or gangrene: Secondary | ICD-10-CM | POA: Diagnosis not present

## 2018-03-28 DIAGNOSIS — I110 Hypertensive heart disease with heart failure: Secondary | ICD-10-CM | POA: Diagnosis not present

## 2018-03-28 DIAGNOSIS — K224 Dyskinesia of esophagus: Secondary | ICD-10-CM | POA: Insufficient documentation

## 2018-03-28 DIAGNOSIS — N401 Enlarged prostate with lower urinary tract symptoms: Secondary | ICD-10-CM | POA: Insufficient documentation

## 2018-03-28 DIAGNOSIS — R1314 Dysphagia, pharyngoesophageal phase: Secondary | ICD-10-CM | POA: Diagnosis not present

## 2018-03-28 DIAGNOSIS — I2510 Atherosclerotic heart disease of native coronary artery without angina pectoris: Secondary | ICD-10-CM | POA: Insufficient documentation

## 2018-03-28 HISTORY — PX: BIOPSY: SHX5522

## 2018-03-28 HISTORY — PX: ESOPHAGOGASTRODUODENOSCOPY: SHX5428

## 2018-03-28 HISTORY — PX: ESOPHAGEAL DILATION: SHX303

## 2018-03-28 SURGERY — EGD (ESOPHAGOGASTRODUODENOSCOPY)
Anesthesia: Moderate Sedation

## 2018-03-28 MED ORDER — MIDAZOLAM HCL 5 MG/5ML IJ SOLN
INTRAMUSCULAR | Status: DC | PRN
  Administered 2018-03-28 (×2): 2 mg via INTRAVENOUS

## 2018-03-28 MED ORDER — STERILE WATER FOR IRRIGATION IR SOLN
Status: DC | PRN
  Administered 2018-03-28: 1.25 mL

## 2018-03-28 MED ORDER — LIDOCAINE VISCOUS HCL 2 % MT SOLN
OROMUCOSAL | Status: DC | PRN
  Administered 2018-03-28: 1 via OROMUCOSAL

## 2018-03-28 MED ORDER — MEPERIDINE HCL 50 MG/ML IJ SOLN
INTRAMUSCULAR | Status: AC
  Filled 2018-03-28: qty 1

## 2018-03-28 MED ORDER — MIDAZOLAM HCL 5 MG/5ML IJ SOLN
INTRAMUSCULAR | Status: AC
  Filled 2018-03-28: qty 10

## 2018-03-28 MED ORDER — FAMOTIDINE 20 MG PO TABS: 40.0000 mg | ORAL_TABLET | Freq: Every day | ORAL | 5 refills | Status: DC

## 2018-03-28 MED ORDER — LIDOCAINE VISCOUS HCL 2 % MT SOLN
OROMUCOSAL | Status: AC
  Filled 2018-03-28: qty 15

## 2018-03-28 MED ORDER — SODIUM CHLORIDE 0.9 % IV SOLN
INTRAVENOUS | Status: DC
  Administered 2018-03-28: 11:00:00 via INTRAVENOUS

## 2018-03-28 MED ORDER — MEPERIDINE HCL 50 MG/ML IJ SOLN
INTRAMUSCULAR | Status: DC | PRN
  Administered 2018-03-28: 25 mg via INTRAVENOUS

## 2018-03-28 NOTE — H&P (Signed)
Grant Stevens is an 78 y.o. male.   Chief Complaint: Patient is here for EGD and ED. HPI: Patient is 78 year old Caucasian male who presents with 1 month history of dysphagia to solids.  He had one episode of food impaction about 4 weeks ago when he was seen in emergency room.  He states he was eating ham biscuit.  He had another episode few days later.  He points to suprasternal area as site of bolus obstruction.  He denies heartburn.  He denies nausea vomiting abdominal pain weight loss or melena.  Past Medical History:  Diagnosis Date  . Allergic rhinitis   . Arthritis   . Asthma    Childhood  . Atrial fibrillation ([place])    Remote history - WARCEF  . Bladder cancer ([place])   . BPH (benign prostatic hyperplasia)   . Cardiomyopathy ([place])   . CHF (congestive heart failure) ([place])   . Coronary atherosclerosis of native coronary artery    Multivessel s/p CABG in 06/2001 post-IMI, LVEF 35% up to 50% postoperatively;  . Depression   . Difficulty sleeping   . Erectile dysfunction   . Essential hypertension, benign   . Frequency of urination   . History of bipolar disorder   . Hyperlipidemia   . IBS (irritable bowel syndrome)   . Myocardial infarction [facility]) 2002    Past Surgical History:  Procedure Laterality Date  . CATARACT EXTRACTION W/PHACO Left 12/29/2014   Procedure: CATARACT EXTRACTION PHACO AND INTRAOCULAR LENS PLACEMENT (IOC);  Surgeon: Williams Che, MD;  [facility]: [facility];  Service: Ophthalmology;  Laterality: Left;  CDE:3.71  . CATARACT EXTRACTION W/PHACO Right 03/30/2015   Procedure: CATARACT EXTRACTION PHACO AND INTRAOCULAR LENS PLACEMENT RIGHT EYE CDE=2.56;  Surgeon: Williams Che, MD;  Location: [facility];  Service: Ophthalmology;  Laterality: Right;  . CIRCUMCISION  10/2003  . COLONOSCOPY N/A 07/11/2012   Procedure: COLONOSCOPY;  Surgeon: Rogene Houston, MD;  [facility];  Service: Endoscopy;  Laterality: N/A;  830-moved to 930 Ann to notify pt  . CORONARY  ARTERY BYPASS GRAFT  08/2000   Dr. Servando Snare - LIMA to LAD, SVG to diagonal, SVG to PDA TRIPLE BYPASS  . CYSTOSCOPY W/ RETROGRADES Bilateral 03/02/2015   Procedure: CYSTOSCOPY WITH RIGHT RETROGRADE PYELOGRAM,  ATTEMPTED LEFT RETROGRADE PYELOGRAM;  Surgeon: Cleon Gustin, MD;  [facility];  Service: Urology;  Laterality: Bilateral;  . TONSILLECTOMY    . TRANSURETHRAL RESECTION OF BLADDER TUMOR N/A 01/26/2015   Procedure: TRANSURETHRAL RESECTION OF BLADDER TUMOR (TURBT);  Surgeon: Cleon Gustin, MD;  [facility];  Service: Urology;  Laterality: N/A;  . TRANSURETHRAL RESECTION OF BLADDER TUMOR WITH GYRUS (TURBT-GYRUS) N/A 03/02/2015   Procedure: TRANSURETHRAL RESECTION OF BLADDER TUMOR WITH GYRUS (TURBT-GYRUS);  Surgeon: Cleon Gustin, MD;  [facility];  Service: Urology;  Laterality: N/A;  . TRANSURETHRAL RESECTION OF PROSTATE N/A 01/26/2015   Procedure: TRANSURETHRAL RESECTION OF THE PROSTATE WITH GYRUS INSTRUMENTS;  Surgeon: Cleon Gustin, MD;  [facility];  Service: Urology;  Laterality: N/A;    Family History  Problem Relation Age of Onset  . Asthma Mother   . Heart attack Father 26  . Diabetes Brother    Social History:  reports that he quit smoking about 47 years ago. His smoking use included cigarettes. He has a 15.00 pack-year smoking history. He has never used smokeless tobacco. He reports current alcohol use. He reports that he does not use drugs.  Allergies: No  Known Allergies  Medications Prior to Admission  Medication Sig Dispense Refill  . ARIPiprazole (ABILIFY) 5 MG tablet Take 5 mg by mouth daily.     Marland Kitchen aspirin EC 81 MG tablet Take 81 mg by mouth daily.    Marland Kitchen atorvastatin (LIPITOR) 80 MG tablet Take 80 mg by mouth at bedtime.      . carbamazepine (TEGRETOL) 200 MG tablet Take 400 mg by mouth 2 (two) times daily.     . divalproex (DEPAKOTE ER) 250 MG 24 hr tablet Take 500 mg by mouth every morning.    . furosemide (LASIX) 40 MG tablet Take  40 mg by mouth daily. May take additional tablet for 3 lbs wt gain over night    . lisinopril (PRINIVIL,ZESTRIL) 5 MG tablet TAKE (1) TABLET BY MOUTH ONCE DAILY. (Patient taking differently: Take 5 mg by mouth daily. ) 90 tablet 3  . LORazepam (ATIVAN) 0.5 MG tablet Take 0.5 mg by mouth at bedtime.    . Psyllium (METAMUCIL FIBER PO) Take 2 capsules by mouth daily.    . tamsulosin (FLOMAX) 0.4 MG CAPS capsule Take 0.4 mg by mouth at bedtime.    . temazepam (RESTORIL) 15 MG capsule Take 15 mg by mouth at bedtime as needed for sleep.    Marland Kitchen albuterol (PROVENTIL HFA;VENTOLIN HFA) 108 (90 Base) MCG/ACT inhaler Inhale 2 puffs into the lungs every 6 (six) hours as needed for wheezing or shortness of breath.    . potassium chloride SA (K-DUR,KLOR-CON) 20 MEQ tablet Take 1 tablet (20 mEq total) by mouth daily. (Patient not taking: Reported on 03/26/2018) 5 tablet 0    No results found for this or any previous visit (from the past 48 hour(s)). No results found.  ROS  Blood pressure 114/70, pulse 74, resp. rate (!) 21, height 6' (1.829 m), weight 108.9 kg, SpO2 98 %. Physical Exam  Constitutional: He appears well-developed and well-nourished.  HENT:  Mouth/Throat: Oropharynx is clear and moist.  Eyes: Conjunctivae are normal. No scleral icterus.  Neck: No thyromegaly present.  Cardiovascular: Normal rate, regular rhythm and normal heart sounds.  No murmur heard. Respiratory: Effort normal and breath sounds normal.  Midsternal scar.  GI:  Abdomen is full but soft and nontender with organomegaly or masses.  Musculoskeletal:        General: Edema present.     Comments: His edema around both ankles. Large and both legs site of venous graft.  Lymphadenopathy:    He has no cervical adenopathy.  Neurological: He is alert.  Skin: Skin is warm and dry.     Assessment/Plan Esophageal dysphagia. EGD with ED.  Hildred Laser, MD 03/28/2018, 11:44 AM

## 2018-03-28 NOTE — Discharge Instructions (Signed)
Upper Endoscopy, Adult, Care After This sheet gives you information about how to care for yourself after your procedure. Your health care provider may also give you more specific instructions. If you have problems or questions, contact your health care provider. What can I expect after the procedure? After the procedure, it is common to have:  A sore throat.  Mild stomach pain or discomfort.  Bloating.  Nausea. Follow these instructions at home:   Follow instructions from your health care provider about what to eat or drink after your procedure.  Return to your normal activities as told by your health care provider. Ask your health care provider what activities are safe for you.  Take over-the-counter and prescription medicines only as told by your health care provider.  Do not drive for 24 hours if you were given a sedative during your procedure.  Keep all follow-up visits as told by your health care provider. This is important. Contact a health care provider if you have:  A sore throat that lasts longer than one day.  Trouble swallowing. Get help right away if:  You vomit blood or your vomit looks like coffee grounds.  You have: ? A fever. ? Bloody, black, or tarry stools. ? A severe sore throat or you cannot swallow. ? Difficulty breathing. ? Severe pain in your chest or abdomen. Summary  After the procedure, it is common to have a sore throat, mild stomach discomfort, bloating, and nausea.  Do not drive for 24 hours if you were given a sedative during the procedure.  Follow instructions from your health care provider about what to eat or drink after your procedure.  Return to your normal activities as told by your health care provider. This information is not intended to replace advice given to you by your health care provider. Make sure you discuss any questions you have with your health care provider. Document Released: 06/28/2011 Document Revised: 05/29/2017  Document Reviewed: 05/29/2017 Elsevier Interactive Patient Education  2019 [employer]. Resume aspirin on 03/31/2018. Resume other medications as before. Famotidine 40 mg by mouth daily at bedtime. Resume usual diet.  You must chew food thoroughly before swallowing. No driving for 24 hours. Physician will call with results of biopsy and blood test.

## 2018-03-28 NOTE — Op Note (Addendum)
[facility] Patient Name: Grant Stevens Procedure Date: 03/28/2018 11:27 AM MRN: [id number] Date of Birth: Oct 13, 1940 Attending MD: Hildred Laser , MD CSN: [id number] Age: 78 Admit Type: Outpatient Procedure:                Upper GI endoscopy Indications:              Esophageal dysphagia Providers:                Hildred Laser, MD, Lurline Del, RN, Aram Candela Referring MD:             [name], MD Medicines:                Lidocaine spray, Meperidine 25 mg IV, Midazolam 4                            mg IV Complications:            No immediate complications. Estimated Blood Loss:     Estimated blood loss was minimal. Procedure:                Pre-Anesthesia Assessment:                           - Prior to the procedure, a History and Physical                            was performed, and patient medications and                            allergies were reviewed. The patient's tolerance of                            previous anesthesia was also reviewed. The risks                            and benefits of the procedure and the sedation                            options and risks were discussed with the patient.                            All questions were answered, and informed consent                            was obtained. Prior Anticoagulants: The patient                            last took aspirin 3 days prior to the procedure.                            ASA Grade Assessment: III - A patient with severe                            systemic disease. After reviewing the risks and  benefits, the patient was deemed in satisfactory                            condition to undergo the procedure.                           After obtaining informed consent, the endoscope was                            passed under direct vision. Throughout the                            procedure, the patient's blood pressure, pulse, and                            oxygen  saturations were monitored continuously. The                            GIF-H190 ([id number]) scope was introduced through the                            mouth, and advanced to the second part of duodenum.                            The upper GI endoscopy was accomplished without                            difficulty. The patient tolerated the procedure                            well. Scope In: 11:56:21 AM Scope Out: 12:13:09 PM Total Procedure Duration: 0 hours 16 minutes 48 seconds  Findings:      Abnormal motility was noted in the mid esophagus and in the distal       esophagus. There are extra peristaltic waves in the esophageal body.      A mild Schatzki ring was found at the gastroesophageal junction. The       dilation site was examined and showed mild mucosal disruption, moderate       improvement in luminal narrowing and no perforation.      Patch of salmon colored mucosa at distal esophagus with squamous island       in the middle. This was biopsied with a cold forceps for histology.      A 2 cm hiatal hernia was present.      A few erosions were found in the gastric body and in the gastric antrum.      The pylorus was normal.      A few erosions without bleeding were found in the duodenal bulb.      The second portion of the duodenum was normal. Impression:               - Abnormal esophageal motility.                           - Mild Schatzki ring at GEJ(39).                           -  Patch of salmon colored mucosa at distal                            esophagus. Biopsied.                           - 2 cm hiatal hernia.                           - Erosive gastropathy.                           - Normal pylorus.                           - Duodenal erosions without bleeding.                           - Normal second portion of the duodenum. Moderate Sedation:      Moderate (conscious) sedation was administered by the endoscopy nurse       and supervised by the endoscopist.  The following parameters were       monitored: oxygen saturation, heart rate, blood pressure, CO2       capnography and response to care. Total physician intraservice time was       24 minutes. Recommendation:           - Patient has a contact number available for                            emergencies. The signs and symptoms of potential                            delayed complications were discussed with the                            patient. Return to normal activities tomorrow.                            Written discharge instructions were provided to the                            patient.                           - Resume previous diet today.                           - Continue present medications.                           - Resume aspirin at prior dose in 3 days.                           - Hpylori serology.                           - Await pathology results.                           -  Use Pepcid (famotidine) 40 mg PO daily at bedtime. Procedure Code(s):        --- Professional ---                           [phone], Esophagogastroduodenoscopy, flexible,                            transoral; with biopsy, single or multiple                           99153, Moderate sedation; each additional 15                            minutes intraservice time                           G0500, Moderate sedation services provided by the                            same physician or other qualified health care                            professional performing a gastrointestinal                            endoscopic service that sedation supports,                            requiring the presence of an independent trained                            observer to assist in the monitoring of the                            patient's level of consciousness and physiological                            status; initial 15 minutes of intra-service time;                            patient age 39 years or  older (additional time may                            be reported with [phone], as appropriate) Diagnosis Code(s):        --- Professional ---                           K22.4, Dyskinesia of esophagus                           K22.2, Esophageal obstruction                           K22.8, Other specified diseases of esophagus  K44.9, Diaphragmatic hernia without obstruction or                            gangrene                           K31.89, Other diseases of stomach and duodenum                           K26.9, Duodenal ulcer, unspecified as acute or                            chronic, without hemorrhage or perforation                           R13.14, Dysphagia, pharyngoesophageal phase CPT copyright 2018 American [employer]. All rights reserved. The codes documented in this report are preliminary and upon coder review may  be revised to meet current compliance requirements. Hildred Laser, MD Hildred Laser, MD 03/28/2018 12:41:08 PM This report has been signed electronically. Number of Addenda: 0

## 2018-03-29 LAB — H. PYLORI ANTIBODY, IGG: H Pylori IgG: 0.2 Index Value (ref 0.00–0.79)

## 2018-03-30 ENCOUNTER — Encounter (HOSPITAL_COMMUNITY): Payer: Self-pay | Admitting: Internal Medicine

## 2018-03-30 DIAGNOSIS — C672 Malignant neoplasm of lateral wall of bladder: Secondary | ICD-10-CM | POA: Diagnosis not present

## 2018-04-11 ENCOUNTER — Other Ambulatory Visit: Payer: Self-pay | Admitting: Cardiology

## 2018-04-13 DIAGNOSIS — F3110 Bipolar disorder, current episode manic without psychotic features, unspecified: Secondary | ICD-10-CM | POA: Diagnosis not present

## 2018-04-16 DIAGNOSIS — R300 Dysuria: Secondary | ICD-10-CM | POA: Diagnosis not present

## 2018-04-16 DIAGNOSIS — I5022 Chronic systolic (congestive) heart failure: Secondary | ICD-10-CM | POA: Diagnosis not present

## 2018-04-16 DIAGNOSIS — F319 Bipolar disorder, unspecified: Secondary | ICD-10-CM | POA: Diagnosis not present

## 2018-05-15 DIAGNOSIS — F319 Bipolar disorder, unspecified: Secondary | ICD-10-CM | POA: Diagnosis not present

## 2018-05-15 DIAGNOSIS — I5022 Chronic systolic (congestive) heart failure: Secondary | ICD-10-CM | POA: Diagnosis not present

## 2018-05-21 DIAGNOSIS — E875 Hyperkalemia: Secondary | ICD-10-CM | POA: Diagnosis not present

## 2018-05-21 DIAGNOSIS — I5022 Chronic systolic (congestive) heart failure: Secondary | ICD-10-CM | POA: Diagnosis not present

## 2018-05-28 ENCOUNTER — Encounter (HOSPITAL_COMMUNITY): Payer: Self-pay | Admitting: *Deleted

## 2018-05-28 ENCOUNTER — Other Ambulatory Visit: Payer: Self-pay

## 2018-05-28 ENCOUNTER — Emergency Department (HOSPITAL_COMMUNITY)
Admission: EM | Admit: 2018-05-28 | Discharge: 2018-05-28 | Disposition: A | Discharge status: Home / Self Care | Payer: PPO | Attending: Emergency Medicine | Admitting: Emergency Medicine

## 2018-05-28 DIAGNOSIS — Z79899 Other long term (current) drug therapy: Secondary | ICD-10-CM | POA: Diagnosis not present

## 2018-05-28 DIAGNOSIS — I509 Heart failure, unspecified: Secondary | ICD-10-CM | POA: Insufficient documentation

## 2018-05-28 DIAGNOSIS — Y999 Unspecified external cause status: Secondary | ICD-10-CM | POA: Diagnosis not present

## 2018-05-28 DIAGNOSIS — Y92821 Forest as the place of occurrence of the external cause: Secondary | ICD-10-CM | POA: Diagnosis not present

## 2018-05-28 DIAGNOSIS — I252 Old myocardial infarction: Secondary | ICD-10-CM | POA: Diagnosis not present

## 2018-05-28 DIAGNOSIS — M545 Low back pain: Secondary | ICD-10-CM | POA: Diagnosis present

## 2018-05-28 DIAGNOSIS — S30860A Insect bite (nonvenomous) of lower back and pelvis, initial encounter: Secondary | ICD-10-CM | POA: Diagnosis not present

## 2018-05-28 DIAGNOSIS — J45909 Unspecified asthma, uncomplicated: Secondary | ICD-10-CM | POA: Insufficient documentation

## 2018-05-28 DIAGNOSIS — S30861A Insect bite (nonvenomous) of abdominal wall, initial encounter: Secondary | ICD-10-CM | POA: Diagnosis not present

## 2018-05-28 DIAGNOSIS — Y939 Activity, unspecified: Secondary | ICD-10-CM | POA: Insufficient documentation

## 2018-05-28 DIAGNOSIS — Z7982 Long term (current) use of aspirin: Secondary | ICD-10-CM | POA: Insufficient documentation

## 2018-05-28 DIAGNOSIS — I110 Hypertensive heart disease with heart failure: Secondary | ICD-10-CM | POA: Diagnosis not present

## 2018-05-28 DIAGNOSIS — Z87891 Personal history of nicotine dependence: Secondary | ICD-10-CM | POA: Diagnosis not present

## 2018-05-28 DIAGNOSIS — W57XXXA Bitten or stung by nonvenomous insect and other nonvenomous arthropods, initial encounter: Secondary | ICD-10-CM | POA: Diagnosis not present

## 2018-05-28 DIAGNOSIS — I2510 Atherosclerotic heart disease of native coronary artery without angina pectoris: Secondary | ICD-10-CM | POA: Insufficient documentation

## 2018-05-28 DIAGNOSIS — S40861A Insect bite (nonvenomous) of right upper arm, initial encounter: Secondary | ICD-10-CM | POA: Diagnosis not present

## 2018-05-28 MED ORDER — DOXYCYCLINE HYCLATE 100 MG PO TABS
100.0000 mg | ORAL_TABLET | Freq: Once | ORAL | Status: AC
  Administered 2018-05-28: 01:00:00 100 mg via ORAL
  Filled 2018-05-28: qty 1

## 2018-05-28 MED ORDER — DOXYCYCLINE HYCLATE 100 MG PO CAPS: 100.0000 mg | ORAL_CAPSULE | Freq: Two times a day (BID) | ORAL | 0 refills | Status: DC

## 2018-05-28 NOTE — ED Provider Notes (Signed)
[facility] EMERGENCY DEPARTMENT Provider Note   CSN: [id number] Arrival date & time: 05/28/18  0036    History   Chief Complaint Chief Complaint  Patient presents with  . Insect Bite    HPI Grant Stevens is a 78 y.o. male.     Patient presents with suspected tick bites.  States he was working in the woods over the past 2 days and believes that he had several tick bites.  He came in tonight because there is pain and itching in his gluteal cleft and he called the nurse line he was advised to come to the ED.  His girlfriend was unwilling to remove the tick from his buttocks.  He also has several bites to his abdomen and right elbow.  He is also concerned about a lesion on his foot that is been there for several years for which his doctor gave him salicylic acid.  He denies any fevers, chills, nausea, vomiting.  No chest pain or shortness of breath.  Patient does have a history of CAD status post bypass, CHF, bladder cancer, bipolar disorder. Tick was removed from gluteal cleft before my evaluation by nursing staff.  The history is provided by the patient.    Past Medical History:  Diagnosis Date  . Allergic rhinitis   . Arthritis   . Asthma    Childhood  . Atrial fibrillation ([place])    Remote history - WARCEF  . Bladder cancer ([place])   . BPH (benign prostatic hyperplasia)   . Cardiomyopathy ([place])   . CHF (congestive heart failure) ([place])   . Coronary atherosclerosis of native coronary artery    Multivessel s/p CABG in 06/2001 post-IMI, LVEF 35% up to 50% postoperatively;  . Depression   . Difficulty sleeping   . Erectile dysfunction   . Essential hypertension, benign   . Frequency of urination   . History of bipolar disorder   . Hyperlipidemia   . IBS (irritable bowel syndrome)   . Myocardial infarction [facility]) 2002    Patient Active Problem List   Diagnosis Date Noted  . Food impaction of esophagus 03/20/2018  . BPH (benign prostatic hyperplasia) 01/26/2015  .  Dizziness 10/19/2014  . Coronary atherosclerosis of native coronary artery   . History of atrial fibrillation   . Essential hypertension, benign 09/22/2010  . Mixed hyperlipidemia 11/28/2008  . SLEEP APNEA 11/28/2008    Past Surgical History:  Procedure Laterality Date  . BIOPSY  03/28/2018   Procedure: BIOPSY;  Surgeon: Rogene Houston, MD;  Location: [facility] ENDO SUITE;  Service: Endoscopy;;  esophagus  . CATARACT EXTRACTION W/PHACO Left 12/29/2014   Procedure: CATARACT EXTRACTION PHACO AND INTRAOCULAR LENS PLACEMENT (IOC);  Surgeon: Williams Che, MD;  Location: [facility] ORS;  Service: Ophthalmology;  Laterality: Left;  CDE:3.71  . CATARACT EXTRACTION W/PHACO Right 03/30/2015   Procedure: CATARACT EXTRACTION PHACO AND INTRAOCULAR LENS PLACEMENT RIGHT EYE CDE=2.56;  Surgeon: Williams Che, MD;  Location: [facility] ORS;  Service: Ophthalmology;  Laterality: Right;  . CIRCUMCISION  10/2003  . COLONOSCOPY N/A 07/11/2012   Procedure: COLONOSCOPY;  Surgeon: Rogene Houston, MD;  Location: [facility] ENDO SUITE;  Service: Endoscopy;  Laterality: N/A;  830-moved to 930 Ann to notify pt  . CORONARY ARTERY BYPASS GRAFT  08/2000   Dr. Servando Snare - LIMA to LAD, SVG to diagonal, SVG to PDA TRIPLE BYPASS  . CYSTOSCOPY W/ RETROGRADES Bilateral 03/02/2015   Procedure: CYSTOSCOPY WITH RIGHT RETROGRADE PYELOGRAM,  ATTEMPTED LEFT RETROGRADE PYELOGRAM;  Surgeon:  Cleon Gustin, MD;  Location: [facility] ORS;  Service: Urology;  Laterality: Bilateral;  . ESOPHAGEAL DILATION N/A 03/28/2018   Procedure: ESOPHAGEAL DILATION;  Surgeon: Rogene Houston, MD;  Location: [facility];  Service: Endoscopy;  Laterality: N/A;  . ESOPHAGOGASTRODUODENOSCOPY N/A 03/28/2018   Procedure: ESOPHAGOGASTRODUODENOSCOPY (EGD);  Surgeon: Rogene Houston, MD;  Location: [facility] END[facility];  Service: Endoscopy;  Laterality: N/A;  120  . TONSILLECTOMY    . TRANSURETHRAL RESECTION OF BLADDER TUMOR N/A 01/26/2015   Procedure: TRANSURETHRAL RESECTION OF BLADDER TUMOR  (TURBT);  Surgeon: Cleon Gustin, MD;  Location: [facility] OR[facility];  Service: Urology;  Laterality: N/A;  . TRANSURETHRAL RESECTION OF BLADDER TUMOR WITH GYRUS (TURBT-GYRUS) N/A 03/02/2015   Procedure: TRANSURETHRAL RESECTION OF BLADDER TUMOR WITH GYRUS (TURBT-GYRUS);  Surgeon: Cleon Gustin, MD;  [facility];  Service: Urology;  Laterality: N/A;  . TRANSURETHRAL RESECTION OF PROSTATE N/A 01/26/2015   Procedure: TRANSURETHRAL RESECTION OF THE PROSTATE WITH GYRUS INSTRUMENTS;  Surgeon: Cleon Gustin, MD;  [facility]: [facility];  Service: Urology;  Laterality: N/A;        Home Medications    Prior to Admission medications   Medication Sig Start Date End Date Taking? Authorizing Provider  albuterol (PROVENTIL HFA;VENTOLIN HFA) 108 (90 Base) MCG/ACT inhaler Inhale 2 puffs into the lungs every 6 (six) hours as needed for wheezing or shortness of breath.    [provider]  ARIPiprazole (ABILIFY) 5 MG tablet Take 5 mg by mouth daily.  10/09/15   [provider]  aspirin EC 81 MG tablet Take 1 tablet (81 mg total) by mouth daily. 03/31/18   Rehman, Mechele Dawley, MD  atorvastatin (LIPITOR) 80 MG tablet Take 80 mg by mouth at bedtime.      [provider]  carbamazepine (TEGRETOL) 200 MG tablet Take 400 mg by mouth 2 (two) times daily.     [provider]  divalproex (DEPAKOTE ER) 250 MG 24 hr tablet Take 500 mg by mouth every morning. 03/12/18   [provider]  famotidine (PEPCID) 20 MG tablet Take 2 tablets (40 mg total) by mouth at bedtime. 03/28/18   Rehman, Mechele Dawley, MD  furosemide (LASIX) 40 MG tablet Take 40 mg by mouth daily. May take additional tablet for 3 lbs wt gain over night    [provider]  lisinopril (PRINIVIL,ZESTRIL) 5 MG tablet TAKE (1) TABLET BY MOUTH ONCE DAILY. 04/11/18   [name], MD  LORazepam (ATIVAN) 0.5 MG tablet Take 0.5 mg by mouth at bedtime.    [provider]  Psyllium (METAMUCIL FIBER PO) Take 2  capsules by mouth daily.    [provider]  tamsulosin (FLOMAX) 0.4 MG CAPS capsule Take 0.4 mg by mouth at bedtime.    [provider]  temazepam (RESTORIL) 15 MG capsule Take 15 mg by mouth at bedtime as needed for sleep.    [provider]    Family History Family History  Problem Relation Age of Onset  . Asthma Mother   . Heart attack Father 51  . Diabetes Brother     Social History Social History   Tobacco Use  . Smoking status: Former Smoker    Packs/day: 1.00    Years: 15.00    Pack years: 15.00    Types: Cigarettes    Last attempt to quit: 01/11/1971    Years since quitting: 47.4  . Smokeless tobacco: Never Used  Substance Use Topics  . Alcohol use:  Yes    Comment: occ.  . Drug use: No     Allergies   Patient has no known allergies.   Review of Systems Review of Systems  Constitutional: Negative for activity change and appetite change.  HENT: Negative for congestion and rhinorrhea.   Eyes: Negative for visual disturbance.  Respiratory: Negative for chest tightness and shortness of breath.   Cardiovascular: Negative for chest pain and leg swelling.  Gastrointestinal: Negative for abdominal pain, nausea and vomiting.  Genitourinary: Negative for dysuria and hematuria.  Skin: Positive for rash and wound.  Neurological: Negative for dizziness, weakness and headaches.   all other systems are negative except as noted in the HPI and PMH.     Physical Exam Updated Vital Signs BP (!) 151/87   Pulse 79   Temp 98.4 F (36.9 C) (Oral)   Resp 20   Ht 6' (1.829 m)   Wt 103.4 kg   SpO2 100%   BMI 30.92 kg/m   Physical Exam Vitals signs and nursing note reviewed.  Constitutional:      General: He is not in acute distress.    Appearance: He is well-developed.     Comments: Talkative, no distress  HENT:     Head: Normocephalic and atraumatic.     Mouth/Throat:     Pharynx: No oropharyngeal exudate.  Eyes:      Conjunctiva/sclera: Conjunctivae normal.     Pupils: Pupils are equal, round, and reactive to light.  Neck:     Musculoskeletal: Normal range of motion and neck supple.     Comments: No meningismus. Cardiovascular:     Rate and Rhythm: Normal rate and regular rhythm.     Heart sounds: Normal heart sounds. No murmur.  Pulmonary:     Effort: Pulmonary effort is normal. No respiratory distress.     Breath sounds: Normal breath sounds.  Abdominal:     Palpations: Abdomen is soft.     Tenderness: There is no abdominal tenderness. There is no guarding or rebound.  Genitourinary:    Comments: Punctate erythematous insect bite to left gluteal cleft.  No surrounding induration or fluctuance.  Erythematous insect bites to right abdomen x2 and right elbow.  Musculoskeletal: Normal range of motion.        General: No tenderness.  Skin:    General: Skin is warm.     Capillary Refill: Capillary refill takes less than 2 seconds.     Findings: Rash present.     Comments: Tick bites to gluteal cleft, abdomen and right elbow as above.  Apparent plantar wart to sole of right foot.  Neurological:     General: No focal deficit present.     Mental Status: He is alert and oriented to person, place, and time. Mental status is at baseline.     Cranial Nerves: No cranial nerve deficit.     Motor: No abnormal muscle tone.     Coordination: Coordination normal.     Comments: No ataxia on finger to nose bilaterally. No pronator drift. 5/5 strength throughout. CN 2-12 intact.Equal grip strength. Sensation intact.   Psychiatric:        Behavior: Behavior normal.      ED Treatments / Results  Labs (all labs ordered are listed, but only abnormal results are displayed) Labs Reviewed - No data to display  EKG None  Radiology No results found.  Procedures Procedures (including critical care time)  Medications Ordered in ED Medications  doxycycline (VIBRA-TABS) tablet 100 mg (  has no  administration in time range)     Initial Impression / Assessment and Plan / ED Course  I have reviewed the triage vital signs and the nursing notes.  Pertinent labs & imaging results that were available during my care of the patient were reviewed by me and considered in my medical decision making (see chart for details).       Patient to the ED for tick removal.  Multiple tick bites over the past several days.  No fevers, chills, nausea, vomiting, headache.  Also concerned about plantar wart on foot.  Tick removed from buttocks by nursing staff.  No other ticks visualized but does have several areas of possible bites. Patient will be started on prophylactic doxycycline.  Will be given referral to podiatry regarding his plantar wart.  Continue salicylic acid as prescribed by PCP.  Return precautions discussed including fever, chills, chest pain, shortness of breath or any other concerns. Final Clinical Impressions(s) / ED Diagnoses   Final diagnoses:  Tick bite, initial encounter    ED Discharge Orders    None       Maddex Garlitz, Annie Main, MD 05/28/18 [phone]

## 2018-05-28 NOTE — ED Notes (Signed)
Pt reports he was cutting wood yesterday and noticed insect bites in multiple locations on body and tick was still implanted in left buttocks. This RN notified EDP then removed insect from left buttocks and cleaned the wound.

## 2018-05-28 NOTE — Discharge Instructions (Signed)
Take the antibiotics as prescribed and follow-up with your doctor.  Return to the ED with fever, headache, chest pain, shortness of breath or other concerns.

## 2018-05-28 NOTE — ED Triage Notes (Signed)
Pt c/o tick bites to several areas, red in color, pt states that he noticed the areas yesterday,

## 2018-05-28 NOTE — ED Notes (Signed)
ED Provider at bedside. 

## 2018-06-01 DIAGNOSIS — H04123 Dry eye syndrome of bilateral lacrimal glands: Secondary | ICD-10-CM | POA: Diagnosis not present

## 2018-06-01 DIAGNOSIS — H0102B Squamous blepharitis left eye, upper and lower eyelids: Secondary | ICD-10-CM | POA: Diagnosis not present

## 2018-06-01 DIAGNOSIS — H26493 Other secondary cataract, bilateral: Secondary | ICD-10-CM | POA: Diagnosis not present

## 2018-06-01 DIAGNOSIS — H353132 Nonexudative age-related macular degeneration, bilateral, intermediate dry stage: Secondary | ICD-10-CM | POA: Diagnosis not present

## 2018-06-01 DIAGNOSIS — Z961 Presence of intraocular lens: Secondary | ICD-10-CM | POA: Diagnosis not present

## 2018-06-01 DIAGNOSIS — H0102A Squamous blepharitis right eye, upper and lower eyelids: Secondary | ICD-10-CM | POA: Diagnosis not present

## 2018-06-07 DIAGNOSIS — Z79899 Other long term (current) drug therapy: Secondary | ICD-10-CM | POA: Diagnosis not present

## 2018-06-07 DIAGNOSIS — I5022 Chronic systolic (congestive) heart failure: Secondary | ICD-10-CM | POA: Diagnosis not present

## 2018-06-10 ENCOUNTER — Encounter (HOSPITAL_COMMUNITY): Payer: Self-pay | Admitting: Emergency Medicine

## 2018-06-10 ENCOUNTER — Emergency Department (HOSPITAL_COMMUNITY)
Admission: EM | Admit: 2018-06-10 | Discharge: 2018-06-10 | Disposition: A | Discharge status: Home / Self Care | Payer: PPO | Attending: Emergency Medicine | Admitting: Emergency Medicine

## 2018-06-10 ENCOUNTER — Other Ambulatory Visit: Payer: Self-pay

## 2018-06-10 DIAGNOSIS — Z951 Presence of aortocoronary bypass graft: Secondary | ICD-10-CM | POA: Diagnosis not present

## 2018-06-10 DIAGNOSIS — I2510 Atherosclerotic heart disease of native coronary artery without angina pectoris: Secondary | ICD-10-CM | POA: Insufficient documentation

## 2018-06-10 DIAGNOSIS — F319 Bipolar disorder, unspecified: Secondary | ICD-10-CM | POA: Insufficient documentation

## 2018-06-10 DIAGNOSIS — S30860A Insect bite (nonvenomous) of lower back and pelvis, initial encounter: Secondary | ICD-10-CM | POA: Diagnosis not present

## 2018-06-10 DIAGNOSIS — Z5189 Encounter for other specified aftercare: Secondary | ICD-10-CM

## 2018-06-10 DIAGNOSIS — I110 Hypertensive heart disease with heart failure: Secondary | ICD-10-CM | POA: Diagnosis not present

## 2018-06-10 DIAGNOSIS — S30860D Insect bite (nonvenomous) of lower back and pelvis, subsequent encounter: Secondary | ICD-10-CM | POA: Diagnosis not present

## 2018-06-10 DIAGNOSIS — Z79899 Other long term (current) drug therapy: Secondary | ICD-10-CM | POA: Diagnosis not present

## 2018-06-10 DIAGNOSIS — Y929 Unspecified place or not applicable: Secondary | ICD-10-CM | POA: Insufficient documentation

## 2018-06-10 DIAGNOSIS — Y939 Activity, unspecified: Secondary | ICD-10-CM | POA: Insufficient documentation

## 2018-06-10 DIAGNOSIS — I509 Heart failure, unspecified: Secondary | ICD-10-CM | POA: Insufficient documentation

## 2018-06-10 DIAGNOSIS — W57XXXA Bitten or stung by nonvenomous insect and other nonvenomous arthropods, initial encounter: Secondary | ICD-10-CM | POA: Diagnosis not present

## 2018-06-10 DIAGNOSIS — Z8551 Personal history of malignant neoplasm of bladder: Secondary | ICD-10-CM | POA: Diagnosis not present

## 2018-06-10 DIAGNOSIS — Y999 Unspecified external cause status: Secondary | ICD-10-CM | POA: Insufficient documentation

## 2018-06-10 DIAGNOSIS — Z4800 Encounter for change or removal of nonsurgical wound dressing: Secondary | ICD-10-CM | POA: Diagnosis present

## 2018-06-10 NOTE — ED Provider Notes (Signed)
[facility] EMERGENCY DEPARTMENT Provider Note   CSN: [id number] Arrival date & time: 06/10/18  0009    History   Chief Complaint Chief Complaint  Patient presents with  . Tick Removal    HPI TOBYN OSGOOD is a 78 y.o. male.     HPI  This is a 78 year old male with a history of heart failure, atrial fibrillation, bipolar disorder who presents with concerns for tick bite.  Reports that he believes he has a tick on his buttock.  He tried to have his girlfriend remove it but she would not.  He denies any significant pain.  Reports that he feels like he got the tick in the last 1 to 2 days.  Has noted some itching but no pain.  No other rash.  Denies fevers.  Past Medical History:  Diagnosis Date  . Allergic rhinitis   . Arthritis   . Asthma    Childhood  . Atrial fibrillation ([place])    Remote history - WARCEF  . Bladder cancer ([place])   . BPH (benign prostatic hyperplasia)   . Cardiomyopathy ([place])   . CHF (congestive heart failure) ([place])   . Coronary atherosclerosis of native coronary artery    Multivessel s/p CABG in 06/2001 post-IMI, LVEF 35% up to 50% postoperatively;  . Depression   . Difficulty sleeping   . Erectile dysfunction   . Essential hypertension, benign   . Frequency of urination   . History of bipolar disorder   . Hyperlipidemia   . IBS (irritable bowel syndrome)   . Myocardial infarction [facility]) 2002    Patient Active Problem List   Diagnosis Date Noted  . Food impaction of esophagus 03/20/2018  . BPH (benign prostatic hyperplasia) 01/26/2015  . Dizziness 10/19/2014  . Coronary atherosclerosis of native coronary artery   . History of atrial fibrillation   . Essential hypertension, benign 09/22/2010  . Mixed hyperlipidemia 11/28/2008  . SLEEP APNEA 11/28/2008    Past Surgical History:  Procedure Laterality Date  . BIOPSY  03/28/2018   Procedure: BIOPSY;  Surgeon: Rogene Houston, MD;  Location[facility];  Service: Endoscopy;;  esophagus   . CATARACT EXTRACTION W/PHACO Left 12/29/2014   Procedure: CATARACT EXTRACTION PHACO AND INTRAOCULAR LENS PLACEMENT (IOC);  Surgeon: Williams Che, MD;  Location: [facility] ORS;  Service: Ophthalmology;  Laterality: Left;  CDE:3.71  . CATARACT EXTRACTION W/PHACO Right 03/30/2015   Procedure: CATARACT EXTRACTION PHACO AND INTRAOCULAR LENS PLACEMENT RIGHT EYE CDE=2.56;  Surgeon: Williams Che, MD;  Location: [facility] ORS;  Service: Ophthalmology;  Laterality: Right;  . CIRCUMCISION  10/2003  . COLONOSCOPY N/A 07/11/2012   Procedure: COLONOSCOPY;  Surgeon: Rogene Houston, MD;  Location: [facility] END[facility] SU[facility];  Service: Endoscopy;  Laterality: N/A;  830-moved to 930 Ann to notify pt  . CORONARY ARTERY BYPASS GRAFT  08/2000   Dr. Servando Snare - LIMA to LAD, SVG to diagonal, SVG to PDA TRIPLE BYPASS  . CYSTOSCOPY W/ RETROGRADES Bilateral 03/02/2015   Procedure: CYSTOSCOPY WITH RIGHT RETROGRADE PYELOGRAM,  ATTEMPTED LEFT RETROGRADE PYELOGRAM;  Surgeon: Cleon Gustin, MD;  Location: [facility];  Service: Urology;  Laterality: Bilateral;  . ESOPHAGEAL DILATION N/A 03/28/2018   Procedure: ESOPHAGEAL DILATION;  Surgeon: Rogene Houston, MD;  Location: [facility];  Service: Endoscopy;  Laterality: N/A;  . ESOPHAGOGASTRODUODENOSCOPY N/A 03/28/2018   Procedure: ESOPHAGOGASTRODUODENOSCOPY (EGD);  Surgeon: Rogene Houston, MD;  Location: [facility];  Service: Endoscopy;  Laterality: N/A;  120  .  TONSILLECTOMY    . TRANSURETHRAL RESECTION OF BLADDER TUMOR N/A 01/26/2015   Procedure: TRANSURETHRAL RESECTION OF BLADDER TUMOR (TURBT);  Surgeon: Cleon Gustin, MD;  Location: [facility];  Service: Urology;  Laterality: N/A;  . TRANSURETHRAL RESECTION OF BLADDER TUMOR WITH GYRUS (TURBT-GYRUS) N/A 03/02/2015   Procedure: TRANSURETHRAL RESECTION OF BLADDER TUMOR WITH GYRUS (TURBT-GYRUS);  Surgeon: Cleon Gustin, MD;  [facility]: [facility];  Service: Urology;  Laterality: N/A;  . TRANSURETHRAL RESECTION OF PROSTATE N/A 01/26/2015    Procedure: TRANSURETHRAL RESECTION OF THE PROSTATE WITH GYRUS INSTRUMENTS;  Surgeon: Cleon Gustin, MD;  [facility]: [facility];  Service: Urology;  Laterality: N/A;        Home Medications    Prior to Admission medications   Medication Sig Start Date End Date Taking? Authorizing Provider  albuterol (PROVENTIL HFA;VENTOLIN HFA) 108 (90 Base) MCG/ACT inhaler Inhale 2 puffs into the lungs every 6 (six) hours as needed for wheezing or shortness of breath.    [provider]  ARIPiprazole (ABILIFY) 5 MG tablet Take 5 mg by mouth daily.  10/09/15   [provider]  aspirin EC 81 MG tablet Take 1 tablet (81 mg total) by mouth daily. 03/31/18   Rehman, Mechele Dawley, MD  atorvastatin (LIPITOR) 80 MG tablet Take 80 mg by mouth at bedtime.      [provider]  carbamazepine (TEGRETOL) 200 MG tablet Take 400 mg by mouth 2 (two) times daily.     [provider]  divalproex (DEPAKOTE ER) 250 MG 24 hr tablet Take 500 mg by mouth every morning. 03/12/18   [provider]  doxycycline (VIBRAMYCIN) 100 MG capsule Take 1 capsule (100 mg total) by mouth 2 (two) times daily. 05/28/18   Rancour, Annie Main, MD  famotidine (PEPCID) 20 MG tablet Take 2 tablets (40 mg total) by mouth at bedtime. 03/28/18   Rehman, Mechele Dawley, MD  furosemide (LASIX) 40 MG tablet Take 40 mg by mouth daily. May take additional tablet for 3 lbs wt gain over night    [provider]  lisinopril (PRINIVIL,ZESTRIL) 5 MG tablet TAKE (1) TABLET BY MOUTH ONCE DAILY. 04/11/18   [name], MD  LORazepam (ATIVAN) 0.5 MG tablet Take 0.5 mg by mouth at bedtime.    [provider]  Psyllium (METAMUCIL FIBER PO) Take 2 capsules by mouth daily.    [provider]  tamsulosin (FLOMAX) 0.4 MG CAPS capsule Take 0.4 mg by mouth at bedtime.    [provider]  temazepam (RESTORIL) 15 MG capsule Take 15 mg by mouth at bedtime as needed for sleep.    [provider]     Family History Family History  Problem Relation Age of Onset  . Asthma Mother   . Heart attack Father 29  . Diabetes Brother     Social History Social History   Tobacco Use  . Smoking status: Former Smoker    Packs/day: 1.00    Years: 15.00    Pack years: 15.00    Types: Cigarettes    Last attempt to quit: 01/11/1971    Years since quitting: 47.4  . Smokeless tobacco: Never Used  Substance Use Topics  . Alcohol use: Yes    Comment: occ.  . Drug use: No     Allergies   Patient has no known allergies.   Review of Systems Review of Systems  Constitutional: Negative for fever.  Respiratory: Negative for shortness of breath.   Cardiovascular: Negative for chest  pain.  Gastrointestinal: Negative for abdominal pain, nausea and vomiting.  Skin: Positive for wound. Negative for rash.  All other systems reviewed and are negative.    Physical Exam Updated Vital Signs BP 139/71 (BP Location: Right Arm)   Pulse 73   Temp 97.9 F (36.6 C) (Oral)   Resp 16   Ht 1.829 m (6')   Wt 103.4 kg   SpO2 96%   BMI 30.92 kg/m   Physical Exam Vitals signs and nursing note reviewed.  Constitutional:      Appearance: He is well-developed. He is not ill-appearing.  HENT:     Head: Normocephalic and atraumatic.     Mouth/Throat:     Mouth: Mucous membranes are moist.  Neck:     Musculoskeletal: Neck supple.  Cardiovascular:     Rate and Rhythm: Normal rate and regular rhythm.     Heart sounds: Normal heart sounds.  Pulmonary:     Effort: Pulmonary effort is normal. No respiratory distress.  Musculoskeletal:        General: No swelling.  Skin:    General: Skin is warm and dry.     Comments: Excoriations noted over the left buttock with a small scab, no insect or tick noted, no significant erythema No systemic rash noted  Neurological:     Mental Status: He is alert and oriented to person, place, and time.  Psychiatric:        Mood and Affect: Mood normal.      ED  Treatments / Results  Labs (all labs ordered are listed, but only abnormal results are displayed) Labs Reviewed - No data to display  EKG None  Radiology No results found.  Procedures Procedures (including critical care time)  Medications Ordered in ED Medications - No data to display   Initial Impression / Assessment and Plan / ED Course  I have reviewed the triage vital signs and the nursing notes.  Pertinent labs & imaging results that were available during my care of the patient were reviewed by me and considered in my medical decision making (see chart for details).        Patient presents with concerns for tick.  No tick was noted on exam.  He has a scab presumably where tick was removed last week over the same spot.  He was seen here for the same.  He does have some excoriation but reports itching.  No signs of superimposed infection and he was previously placed on prophylactic doxycycline.  No signs or symptoms of systemic tickborne illness.  Patient was reassured.  Will discharge home.  After history, exam, and medical workup I feel the patient has been appropriately medically screened and is safe for discharge home. Pertinent diagnoses were discussed with the patient. Patient was given return precautions.   Final Clinical Impressions(s) / ED Diagnoses   Final diagnoses:  Visit for wound check    ED Discharge Orders    None       Merryl Hacker, MD 06/10/18 [phone]

## 2018-06-10 NOTE — Discharge Instructions (Addendum)
You were seen today for possible tick remover.  No tick was found.

## 2018-06-10 NOTE — ED Triage Notes (Signed)
Pt states he has a tick that needs removing from his L. Buttock.

## 2018-06-14 DIAGNOSIS — E875 Hyperkalemia: Secondary | ICD-10-CM | POA: Diagnosis not present

## 2018-06-14 DIAGNOSIS — I5022 Chronic systolic (congestive) heart failure: Secondary | ICD-10-CM | POA: Diagnosis not present

## 2018-06-18 DIAGNOSIS — L851 Acquired keratosis [keratoderma] palmaris et plantaris: Secondary | ICD-10-CM | POA: Diagnosis not present

## 2018-06-18 DIAGNOSIS — M7740 Metatarsalgia, unspecified foot: Secondary | ICD-10-CM | POA: Diagnosis not present

## 2018-06-18 DIAGNOSIS — M79672 Pain in left foot: Secondary | ICD-10-CM | POA: Diagnosis not present

## 2018-06-21 DIAGNOSIS — F3110 Bipolar disorder, current episode manic without psychotic features, unspecified: Secondary | ICD-10-CM | POA: Diagnosis not present

## 2018-07-16 DIAGNOSIS — B070 Plantar wart: Secondary | ICD-10-CM | POA: Diagnosis not present

## 2018-07-16 DIAGNOSIS — M79672 Pain in left foot: Secondary | ICD-10-CM | POA: Diagnosis not present

## 2018-07-23 ENCOUNTER — Other Ambulatory Visit: Payer: Self-pay

## 2018-07-23 DIAGNOSIS — Z20822 Contact with and (suspected) exposure to covid-19: Secondary | ICD-10-CM

## 2018-07-27 LAB — NOVEL CORONAVIRUS, NAA: SARS-CoV-2, NAA: NOT DETECTED

## 2018-08-27 DIAGNOSIS — B070 Plantar wart: Secondary | ICD-10-CM | POA: Diagnosis not present

## 2018-08-27 DIAGNOSIS — M79672 Pain in left foot: Secondary | ICD-10-CM | POA: Diagnosis not present

## 2018-09-10 ENCOUNTER — Ambulatory Visit (INDEPENDENT_AMBULATORY_CARE_PROVIDER_SITE_OTHER): Payer: PPO | Admitting: Orthopedic Surgery

## 2018-09-10 ENCOUNTER — Encounter: Payer: Self-pay | Admitting: Orthopedic Surgery

## 2018-09-10 ENCOUNTER — Other Ambulatory Visit: Payer: Self-pay

## 2018-09-10 VITALS — BP 115/70 | HR 70 | Ht 72.0 in | Wt 224.0 lb

## 2018-09-10 DIAGNOSIS — M25561 Pain in right knee: Secondary | ICD-10-CM

## 2018-09-10 DIAGNOSIS — G8929 Other chronic pain: Secondary | ICD-10-CM

## 2018-09-10 NOTE — Patient Instructions (Signed)

## 2018-09-10 NOTE — Progress Notes (Signed)
Chief Complaint  Patient presents with  . Knee Pain    right    Recurrent pain swelling right knee.  Request injection right knee  Procedure note right knee injection   verbal consent was obtained to inject right knee joint  Timeout was completed to confirm the site of injection  The medications used were 40 mg of Depo-Medrol and 1% lidocaine 3 cc  Anesthesia was provided by ethyl chloride and the skin was prepped with alcohol.  After cleaning the skin with alcohol a 20-gauge needle was used to inject the right knee joint. There were no complications. A sterile bandage was applied.   Encounter Diagnosis  Name Primary?  . Chronic pain of right knee Yes

## 2018-09-14 DIAGNOSIS — I5022 Chronic systolic (congestive) heart failure: Secondary | ICD-10-CM | POA: Diagnosis not present

## 2018-09-14 DIAGNOSIS — E875 Hyperkalemia: Secondary | ICD-10-CM | POA: Diagnosis not present

## 2018-09-14 DIAGNOSIS — Z79899 Other long term (current) drug therapy: Secondary | ICD-10-CM | POA: Diagnosis not present

## 2018-09-17 ENCOUNTER — Other Ambulatory Visit (INDEPENDENT_AMBULATORY_CARE_PROVIDER_SITE_OTHER): Payer: Self-pay | Admitting: Internal Medicine

## 2018-09-21 DIAGNOSIS — F319 Bipolar disorder, unspecified: Secondary | ICD-10-CM | POA: Diagnosis not present

## 2018-09-21 DIAGNOSIS — D696 Thrombocytopenia, unspecified: Secondary | ICD-10-CM | POA: Diagnosis not present

## 2018-09-21 DIAGNOSIS — N183 Chronic kidney disease, stage 3 (moderate): Secondary | ICD-10-CM | POA: Diagnosis not present

## 2018-09-21 DIAGNOSIS — I5022 Chronic systolic (congestive) heart failure: Secondary | ICD-10-CM | POA: Diagnosis not present

## 2018-09-21 DIAGNOSIS — E785 Hyperlipidemia, unspecified: Secondary | ICD-10-CM | POA: Diagnosis not present

## 2018-10-05 DIAGNOSIS — C672 Malignant neoplasm of lateral wall of bladder: Secondary | ICD-10-CM | POA: Diagnosis not present

## 2018-10-08 ENCOUNTER — Telehealth: Payer: Self-pay | Admitting: Cardiology

## 2018-10-08 NOTE — Telephone Encounter (Signed)

## 2018-10-15 ENCOUNTER — Telehealth (INDEPENDENT_AMBULATORY_CARE_PROVIDER_SITE_OTHER): Payer: PPO | Admitting: Cardiology

## 2018-10-15 ENCOUNTER — Encounter: Payer: Self-pay | Admitting: Cardiology

## 2018-10-15 VITALS — Ht 72.0 in | Wt 230.0 lb

## 2018-10-15 DIAGNOSIS — E782 Mixed hyperlipidemia: Secondary | ICD-10-CM | POA: Diagnosis not present

## 2018-10-15 DIAGNOSIS — I255 Ischemic cardiomyopathy: Secondary | ICD-10-CM | POA: Diagnosis not present

## 2018-10-15 DIAGNOSIS — I25119 Atherosclerotic heart disease of native coronary artery with unspecified angina pectoris: Secondary | ICD-10-CM | POA: Diagnosis not present

## 2018-10-15 DIAGNOSIS — I10 Essential (primary) hypertension: Secondary | ICD-10-CM | POA: Diagnosis not present

## 2018-10-15 NOTE — Patient Instructions (Signed)
Medication Instructions: Your physician recommends that you continue on your current medications as directed. Please refer to the Current Medication list given to you today.   Labwork: None  Procedures/Testing: Your physician has requested that you have an echocardiogram. Echocardiography is a painless test that uses sound waves to create images of your heart. It provides your doctor with information about the size and shape of your heart and how well your heart's chambers and valves are working. This procedure takes approximately one hour. There are no restrictions for this procedure.   Follow-Up:   We will call you with echo results No follow-ups on file. up will be determioned after the echo  Any Additional Special Instructions Will Be Listed Below (If Applicable).     If you need a refill on your cardiac medications before your next appointment, please call your pharmacy.

## 2018-10-15 NOTE — Progress Notes (Signed)
Virtual Visit via Telephone Note   This visit type was conducted due to national recommendations for restrictions regarding the COVID-19 Pandemic (e.g. social distancing) in an effort to limit this patient's exposure and mitigate transmission in our community.  Due to his co-morbid illnesses, this patient is at least at moderate risk for complications without adequate follow up.  This format is felt to be most appropriate for this patient at this time.  The patient did not have access to video technology/had technical difficulties with video requiring transitioning to audio format only (telephone).  All issues noted in this document were discussed and addressed.  No physical exam could be performed with this format.  Please refer to the patient's chart for his  consent to telehealth for [facility].   Date:  10/15/2018   ID:  Grant Stevens, DOB [birth year], MRN [id number]  Patient Location: Home Provider Location: Home  PCP:  [name], MD  Cardiologist:  Rozann Lesches, MD Electrophysiologist:  None   Evaluation Performed:  Follow-Up Visit  Chief Complaint:   Cardiac follow-up  History of Present Illness:    Grant Stevens is a 78 y.o. male last seen in October 2019.  We spoke by phone today.  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  He has had trouble with arthritis.  He is in the process of having this evaluated further, may ultimately need surgery.  He still exercises 3 days a week at [employer].  He does not report any angina symptoms, no palpitations or syncope.  His last echocardiogram was in 2018 as outlined below.  We discussed obtaining a follow-up study, possible medication adjustments thereafter.  He has declined ICD over time.  I reviewed his present cardiac regimen.  The patient does not have symptoms concerning for COVID-19 infection (fever, chills, cough, or new shortness of breath).     Past Medical History:  Diagnosis Date  . Allergic rhinitis   . Arthritis   . Asthma    Childhood  . Atrial fibrillation ([place])    Remote history - WARCEF  . Bladder cancer ([place])   . BPH (benign prostatic hyperplasia)   . Cardiomyopathy ([place])   . CHF (congestive heart failure) ([place])   . Coronary atherosclerosis of native coronary artery    Multivessel s/p CABG in 06/2001 post-IMI, LVEF 35% up to 50% postoperatively;  . Depression   . Difficulty sleeping   . Erectile dysfunction   . Essential hypertension   . Frequency of urination   . History of bipolar disorder   . Hyperlipidemia   . IBS (irritable bowel syndrome)   . Myocardial infarction [facility]) 2002   Past Surgical History:  Procedure Laterality Date  . BIOPSY  03/28/2018   Procedure: BIOPSY;  Surgeon: Rogene Houston, MD;  [facility];  Service: Endoscopy;;  esophagus  . CATARACT EXTRACTION W/PHACO Left 12/29/2014   Procedure: CATARACT EXTRACTION PHACO AND INTRAOCULAR LENS PLACEMENT (IOC);  Surgeon: Williams Che, MD;  Location: [facility] ORS;  Service: Ophthalmology;  Laterality: Left;  CDE:3.71  . CATARACT EXTRACTION W/PHACO Right 03/30/2015   Procedure: CATARACT EXTRACTION PHACO AND INTRAOCULAR LENS PLACEMENT RIGHT EYE CDE=2.56;  Surgeon: Williams Che, MD;  Location: [facility] ORS;  Service: Ophthalmology;  Laterality: Right;  . CIRCUMCISION  10/2003  . COLONOSCOPY N/A 07/11/2012   Procedure: COLONOSCOPY;  Surgeon: Rogene Houston, MD;  Location: [facility] END[facility];  Service: Endoscopy;  Laterality: N/A;  830-moved to 930 Ann to notify pt  . CORONARY ARTERY BYPASS GRAFT  08/2000   Dr. Servando Snare - LIMA to LAD, SVG to diagonal, SVG to PDA TRIPLE BYPASS  . CYSTOSCOPY W/ RETROGRADES Bilateral 03/02/2015   Procedure: CYSTOSCOPY WITH RIGHT RETROGRADE PYELOGRAM,  ATTEMPTED LEFT RETROGRADE PYELOGRAM;  Surgeon: Cleon Gustin, MD;  Location: [facility] ORS;  Service: Urology;  Laterality: Bilateral;  . ESOPHAGEAL DILATION N/A 03/28/2018    Procedure: ESOPHAGEAL DILATION;  Surgeon: Rogene Houston, MD;  [facility];  Service: Endoscopy;  Laterality: N/A;  . ESOPHAGOGASTRODUODENOSCOPY N/A 03/28/2018   Procedure: ESOPHAGOGASTRODUODENOSCOPY (EGD);  Surgeon: Rogene Houston, MD;  [facility];  Service: Endoscopy;  Laterality: N/A;  120  . TONSILLECTOMY    . TRANSURETHRAL RESECTION OF BLADDER TUMOR N/A 01/26/2015   Procedure: TRANSURETHRAL RESECTION OF BLADDER TUMOR (TURBT);  Surgeon: Cleon Gustin, MD;  [facility];  Service: Urology;  Laterality: N/A;  . TRANSURETHRAL RESECTION OF BLADDER TUMOR WITH GYRUS (TURBT-GYRUS) N/A 03/02/2015   Procedure: TRANSURETHRAL RESECTION OF BLADDER TUMOR WITH GYRUS (TURBT-GYRUS);  Surgeon: Cleon Gustin, MD;  [facility];  Service: Urology;  Laterality: N/A;  . TRANSURETHRAL RESECTION OF PROSTATE N/A 01/26/2015   Procedure: TRANSURETHRAL RESECTION OF THE PROSTATE WITH GYRUS INSTRUMENTS;  Surgeon: Cleon Gustin, MD;  [facility];  Service: Urology;  Laterality: N/A;     Current Meds  Medication Sig  . albuterol (PROVENTIL HFA;VENTOLIN HFA) 108 (90 Base) MCG/ACT inhaler Inhale 2 puffs into the lungs every 6 (six) hours as needed for wheezing or shortness of breath.  . ARIPiprazole (ABILIFY) 5 MG tablet Take 5 mg by mouth daily.   Marland Kitchen aspirin EC 81 MG tablet Take 1 tablet (81 mg total) by mouth daily.  Marland Kitchen atorvastatin (LIPITOR) 80 MG tablet Take 80 mg by mouth at bedtime.    . carbamazepine (TEGRETOL) 200 MG tablet Take 400 mg by mouth 2 (two) times daily.   . famotidine (PEPCID) 20 MG tablet Take 2 tablets (40 mg total) by mouth at bedtime.  . famotidine (PEPCID) 40 MG tablet TAKE (1) TABLET BY MOUTH AT BEDTIME.  . furosemide (LASIX) 40 MG tablet Take 40 mg by mouth daily. May take additional tablet for 3 lbs wt gain over night  . lisinopril (PRINIVIL,ZESTRIL) 5 MG tablet TAKE (1) TABLET BY MOUTH ONCE DAILY.  Marland Kitchen LORazepam (ATIVAN) 0.5 MG tablet Take 0.5  mg by mouth at bedtime.  . Psyllium (METAMUCIL FIBER PO) Take 2 capsules by mouth daily.  . tamsulosin (FLOMAX) 0.4 MG CAPS capsule Take 0.4 mg by mouth at bedtime.  . temazepam (RESTORIL) 15 MG capsule Take 15 mg by mouth at bedtime as needed for sleep.     Allergies:   Patient has no known allergies.   Social History   Tobacco Use  . Smoking status: Former Smoker    Packs/day: 1.00    Years: 15.00    Pack years: 15.00    Types: Cigarettes    Quit date: 01/11/1971    Years since quitting: 47.7  . Smokeless tobacco: Never Used  Substance Use Topics  . Alcohol use: Yes    Comment: occ.  . Drug use: No     Family Hx: The patient's family history includes Asthma in his mother; Diabetes in his brother; Heart attack (age of onset: 66) in his father.  ROS:   Please see the history of present illness. All other  systems reviewed and are negative.   Prior CV studies:   The following studies were reviewed today:  Echocardiogram 11/04/2016: Study Conclusions  - Left ventricle: The cavity size was mildly dilated. Wall   thickness was increased in a pattern of mild LVH. Systolic   function was moderately to severely reduced. The estimated   ejection fraction was in the range of 30% to 35%. Diffuse   hypokinesis. Doppler parameters are consistent with abnormal left   ventricular relaxation (grade 1 diastolic dysfunction).   Indeterminate filling pressures. - Regional wall motion abnormality: Akinesis of the basal-mid   inferior and mid inferolateral myocardium. - Aortic valve: Trileaflet; mildly thickened, mildly calcified   leaflets. - Mitral valve: Calcified annulus. - Left atrium: The atrium was moderately dilated. - Right ventricle: Systolic function was mildly reduced. - Tricuspid valve: There was mild regurgitation. - Pulmonary arteries: PA peak pressure: 33 mm Hg (S). - Inferior vena cava: The vessel was normal in size, with abnormal   respiratory variation. Estimated  CVP 8 mmHg.  Labs/Other Tests and Data Reviewed:    EKG:  An ECG dated 03/15/2018 was personally reviewed today and demonstrated:  Sinus rhythm with prolonged PR interval, right bundle branch block, old inferior/anterolateral infarct pattern.  Recent Labs: 03/24/2018: B Natriuretic Peptide 142.3; BUN 19; Creatinine, Ser 1.13; Hemoglobin 11.6; Platelets 163; Potassium 4.0; Sodium 133    Wt Readings from Last 3 Encounters:  10/15/18 230 lb (104.3 kg)  09/10/18 224 lb (101.6 kg)  06/10/18 228 lb (103.4 kg)     Objective:    Vital Signs:  Ht 6' (1.829 m)   Wt 230 lb (104.3 kg)   BMI 31.19 kg/m    He did not have a way to check vital signs today. Patient spoke in full sentences, not short of breath. No audible wheezing or coughing. Speech pattern normal.  ASSESSMENT & PLAN:    1.  Ischemic cardiomyopathy with LVEF 30 to 35% by last assessment.  He has declined ICD and has done well overall clinically.  Plan to obtain a follow-up echocardiogram.  I did talk with him about potential medication adjustments.  2.  Multivessel CAD status post CABG in 2002.  He reports no angina symptoms with regular exercise plan.  Continue aspirin and statin.  3.  Mixed hyperlipidemia on Lipitor.  He continues to follow with Dr. Willey Blade.  4.  Essential hypertension, continue with current regimen and follow-up with Dr. Willey Blade.  COVID-19 Education: The signs and symptoms of COVID-19 were discussed with the patient and how to seek care for testing (follow up with PCP or arrange E-visit).  The importance of social distancing was discussed today.  Time:   Today, I have spent 8 minutes with the patient with telehealth technology discussing the above problems.     Medication Adjustments/Labs and Tests Ordered: Current medicines are reviewed at length with the patient today.  Concerns regarding medicines are outlined above.   Tests Ordered: Orders Placed This Encounter  Procedures  . ECHOCARDIOGRAM  COMPLETE    Medication Changes: No orders of the defined types were placed in this encounter.   Follow Up:  Review test results and determine follow-up plan.  Signed, Rozann Lesches, MD  10/15/2018 8:51 AM    [place]

## 2018-10-24 ENCOUNTER — Other Ambulatory Visit: Payer: Self-pay

## 2018-10-24 ENCOUNTER — Ambulatory Visit (HOSPITAL_COMMUNITY)
Admission: RE | Admit: 2018-10-24 | Discharge: 2018-10-24 | Disposition: A | Discharge status: Home / Self Care | Payer: PPO | Source: Ambulatory Visit | Attending: Cardiology | Admitting: Cardiology

## 2018-10-24 DIAGNOSIS — I255 Ischemic cardiomyopathy: Secondary | ICD-10-CM | POA: Diagnosis not present

## 2018-10-24 NOTE — Progress Notes (Signed)
*  PRELIMINARY RESULTS* Echocardiogram 2D Echocardiogram has been performed.  Grant Stevens 10/24/2018, 10:23 AM

## 2018-10-29 ENCOUNTER — Other Ambulatory Visit: Payer: Self-pay

## 2018-10-29 ENCOUNTER — Ambulatory Visit (INDEPENDENT_AMBULATORY_CARE_PROVIDER_SITE_OTHER): Payer: PPO | Admitting: Otolaryngology

## 2018-10-29 DIAGNOSIS — H903 Sensorineural hearing loss, bilateral: Secondary | ICD-10-CM

## 2018-10-29 DIAGNOSIS — H6123 Impacted cerumen, bilateral: Secondary | ICD-10-CM | POA: Diagnosis not present

## 2018-11-12 ENCOUNTER — Encounter: Payer: Self-pay | Admitting: Orthopedic Surgery

## 2018-11-12 ENCOUNTER — Other Ambulatory Visit: Payer: Self-pay

## 2018-11-12 ENCOUNTER — Ambulatory Visit: Payer: PPO | Admitting: Orthopedic Surgery

## 2018-11-12 VITALS — BP 123/63 | HR 79 | Ht 72.0 in | Wt 225.0 lb

## 2018-11-12 DIAGNOSIS — M25561 Pain in right knee: Secondary | ICD-10-CM | POA: Diagnosis not present

## 2018-11-12 DIAGNOSIS — G8929 Other chronic pain: Secondary | ICD-10-CM | POA: Diagnosis not present

## 2018-11-12 DIAGNOSIS — M1711 Unilateral primary osteoarthritis, right knee: Secondary | ICD-10-CM | POA: Diagnosis not present

## 2018-11-12 NOTE — Patient Instructions (Addendum)
You have received an injection of steroids into the joint. 15% of patients will have increased pain within the 24 hours postinjection.   This is transient and will go away.   We recommend that you use ice packs on the injection site for 20 minutes every 2 hours and extra strength Tylenol 2 tablets every 8 as needed until the pain resolves.  If you continue to have pain after taking the Tylenol and using the ice please call the office for further instructions.   BRACE FOR ACTIVITY    TYLENOL ARTHRITIS FOR PAIN

## 2018-11-12 NOTE — Progress Notes (Addendum)
Chief Complaint  Patient presents with  . 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  His pain is medial its activity limiting and activity related  Review of systems no swelling some mild stiffness  BP 123/63   Pulse 79   Ht 6' (1.829 m)   Wt 225 lb (102.1 kg)   BMI 30.52 kg/m   Normal development grooming and hygiene ambulates without assistive device no significant limp is noted.  He is noted to have medial joint line tenderness a fairly good range of motion with a stable knee normal strength and muscle tone skin is intact although the tenderness is on the medial joint line  Impression Encounter Diagnoses  Name Primary?  . Chronic pain of right knee   . Primary osteoarthritis of right knee Yes    Procedure note right knee injection   verbal consent was obtained to inject right knee joint  Timeout was completed to confirm the site of injection  The medications used were 40 mg of Depo-Medrol and 1% lidocaine 3 cc  Anesthesia was provided by ethyl chloride and the skin was prepped with alcohol.  After cleaning the skin with alcohol a 20-gauge needle was used to inject the right knee joint. There were no complications. A sterile bandage was applied.  Recommend 39-month follow-up patient is a candidate for knee replacement

## 2018-11-20 DIAGNOSIS — F3110 Bipolar disorder, current episode manic without psychotic features, unspecified: Secondary | ICD-10-CM | POA: Diagnosis not present

## 2018-12-20 DIAGNOSIS — I2510 Atherosclerotic heart disease of native coronary artery without angina pectoris: Secondary | ICD-10-CM | POA: Diagnosis not present

## 2018-12-20 DIAGNOSIS — F3181 Bipolar II disorder: Secondary | ICD-10-CM | POA: Diagnosis not present

## 2018-12-20 DIAGNOSIS — Z79899 Other long term (current) drug therapy: Secondary | ICD-10-CM | POA: Diagnosis not present

## 2018-12-20 DIAGNOSIS — E785 Hyperlipidemia, unspecified: Secondary | ICD-10-CM | POA: Diagnosis not present

## 2018-12-20 DIAGNOSIS — I5022 Chronic systolic (congestive) heart failure: Secondary | ICD-10-CM | POA: Diagnosis not present

## 2018-12-25 DIAGNOSIS — F3110 Bipolar disorder, current episode manic without psychotic features, unspecified: Secondary | ICD-10-CM | POA: Diagnosis not present

## 2018-12-27 DIAGNOSIS — I5022 Chronic systolic (congestive) heart failure: Secondary | ICD-10-CM | POA: Diagnosis not present

## 2018-12-27 DIAGNOSIS — F319 Bipolar disorder, unspecified: Secondary | ICD-10-CM | POA: Diagnosis not present

## 2018-12-27 DIAGNOSIS — N1831 Chronic kidney disease, stage 3a: Secondary | ICD-10-CM | POA: Diagnosis not present

## 2018-12-28 DIAGNOSIS — Z23 Encounter for immunization: Secondary | ICD-10-CM | POA: Diagnosis not present

## 2019-01-02 DIAGNOSIS — Z79899 Other long term (current) drug therapy: Secondary | ICD-10-CM | POA: Diagnosis not present

## 2019-02-13 ENCOUNTER — Encounter: Payer: Self-pay | Admitting: Orthopedic Surgery

## 2019-02-13 ENCOUNTER — Ambulatory Visit (INDEPENDENT_AMBULATORY_CARE_PROVIDER_SITE_OTHER): Payer: PPO | Admitting: Orthopedic Surgery

## 2019-02-13 ENCOUNTER — Other Ambulatory Visit: Payer: Self-pay

## 2019-02-13 ENCOUNTER — Ambulatory Visit: Payer: PPO

## 2019-02-13 VITALS — BP 128/75 | HR 87 | Ht 72.0 in | Wt 220.0 lb

## 2019-02-13 DIAGNOSIS — M25561 Pain in right knee: Secondary | ICD-10-CM | POA: Diagnosis not present

## 2019-02-13 DIAGNOSIS — G8929 Other chronic pain: Secondary | ICD-10-CM

## 2019-02-13 NOTE — Progress Notes (Signed)
Chief Complaint  Patient presents with  . Knee Pain    right knee / feels better brace helps    Is a 79 year old male status post injection for arthritis of the right knee also in a hole a DePuy reaction OA brace with some improvement in his overall pain  X-rays today to monitor the arthritic progression  He is ambulatory with no assistive devices seems to have a reasonably good gait  He has excellent flexion in his knee but a slight flexion contracture BP 128/75   Pulse 87   Ht 6' (1.829 m)   Wt 220 lb (99.8 kg)   BMI 29.84 kg/m   No real discomfort medial side of the knee and no effusion  X-rays show no progression of arthritis from his last x-ray last year  Okay to wear the brace exercise at [employer] and come back and see [place] in a year to repeat the film  Chronic stable minimal data analysis and minimal risk  Encounter Diagnosis  Name Primary?  . Chronic pain of right knee Yes   [place] is looking good he will continue his bracing come back and see [place] in a year for x-ray

## 2019-02-13 NOTE — Patient Instructions (Signed)
Ok to exercises   Wear brace for activity

## 2019-03-21 DIAGNOSIS — F319 Bipolar disorder, unspecified: Secondary | ICD-10-CM | POA: Diagnosis not present

## 2019-03-21 DIAGNOSIS — N183 Chronic kidney disease, stage 3 unspecified: Secondary | ICD-10-CM | POA: Diagnosis not present

## 2019-03-21 DIAGNOSIS — I5022 Chronic systolic (congestive) heart failure: Secondary | ICD-10-CM | POA: Diagnosis not present

## 2019-03-21 DIAGNOSIS — Z79899 Other long term (current) drug therapy: Secondary | ICD-10-CM | POA: Diagnosis not present

## 2019-03-25 DIAGNOSIS — F3110 Bipolar disorder, current episode manic without psychotic features, unspecified: Secondary | ICD-10-CM | POA: Diagnosis not present

## 2019-03-26 DIAGNOSIS — C672 Malignant neoplasm of lateral wall of bladder: Secondary | ICD-10-CM | POA: Diagnosis not present

## 2019-03-28 DIAGNOSIS — D696 Thrombocytopenia, unspecified: Secondary | ICD-10-CM | POA: Diagnosis not present

## 2019-03-28 DIAGNOSIS — N1831 Chronic kidney disease, stage 3a: Secondary | ICD-10-CM | POA: Diagnosis not present

## 2019-03-28 DIAGNOSIS — I5022 Chronic systolic (congestive) heart failure: Secondary | ICD-10-CM | POA: Diagnosis not present

## 2019-03-30 ENCOUNTER — Other Ambulatory Visit (INDEPENDENT_AMBULATORY_CARE_PROVIDER_SITE_OTHER): Payer: Self-pay | Admitting: Internal Medicine

## 2019-04-01 DIAGNOSIS — L600 Ingrowing nail: Secondary | ICD-10-CM | POA: Diagnosis not present

## 2019-04-01 DIAGNOSIS — M79672 Pain in left foot: Secondary | ICD-10-CM | POA: Diagnosis not present

## 2019-04-01 DIAGNOSIS — B070 Plantar wart: Secondary | ICD-10-CM | POA: Diagnosis not present

## 2019-04-17 ENCOUNTER — Other Ambulatory Visit: Payer: Self-pay

## 2019-04-17 ENCOUNTER — Encounter: Payer: Self-pay | Admitting: Cardiology

## 2019-04-17 ENCOUNTER — Ambulatory Visit: Payer: PPO | Admitting: Cardiology

## 2019-04-17 VITALS — BP 128/80 | HR 74 | Temp 99.2°F | Ht 72.0 in | Wt 222.6 lb

## 2019-04-17 DIAGNOSIS — I255 Ischemic cardiomyopathy: Secondary | ICD-10-CM

## 2019-04-17 DIAGNOSIS — I25119 Atherosclerotic heart disease of native coronary artery with unspecified angina pectoris: Secondary | ICD-10-CM | POA: Diagnosis not present

## 2019-04-17 NOTE — Patient Instructions (Signed)

## 2019-04-17 NOTE — Progress Notes (Signed)
Cardiology Office Note  Date: 04/17/2019   ID: Grant Stevens, DOB 27-Jul-1940, MRN [id number]  PCP:  [name], MD  Cardiologist:  Rozann Lesches, MD Electrophysiologist:  None   Chief Complaint  Patient presents with  . Cardiac follow-up    History of Present Illness: Grant Stevens is a 79 y.o. male last assessed via telehealth encounter in October 2020.  He presents for a routine visit.  He tells me that he tries to remain active although does have to pace himself, he recently turned 42 in March.  He still goes to [employer] 3 days a week.  He retired from his Psychologist, occupational work at [employer].  Follow-up echocardiogram from October 2020 revealed LVEF 30 to 35% range, overall stable with wall motion abnormalities consistent with ischemic cardiomyopathy.  RV contraction normal.  I discussed the results of his echocardiogram, also other options for medical therapy including conversion to [facility].  After discussion he remains comfortable with remaining on the same regimen that he has tolerated for quite some time.   I personally reviewed his ECG today which shows sinus rhythm with old inferior infarct pattern, IVCD, prolonged PR interval.  Past Medical History:  Diagnosis Date  . Allergic rhinitis   . Arthritis   . Asthma    Childhood  . Atrial fibrillation ([place])    Remote history - WARCEF  . Bladder cancer ([place])   . BPH (benign prostatic hyperplasia)   . Cardiomyopathy ([place])   . CHF (congestive heart failure) ([place])   . Coronary atherosclerosis of native coronary artery    Multivessel s/p CABG in 06/2001 post-IMI, LVEF 35% up to 50% postoperatively;  . Depression   . Difficulty sleeping   . Erectile dysfunction   . Essential hypertension   . Frequency of urination   . History of bipolar disorder   . Hyperlipidemia   . IBS (irritable bowel syndrome)   . Myocardial infarction [facility]) 2002    Past Surgical History:  Procedure Laterality Date  . BIOPSY  03/28/2018   Procedure: BIOPSY;  Surgeon: Rogene Houston, MD;  [facility];  Service: Endoscopy;;  esophagus  . CATARACT EXTRACTION W/PHACO Left 12/29/2014   Procedure: CATARACT EXTRACTION PHACO AND INTRAOCULAR LENS PLACEMENT (IOC);  Surgeon: Williams Che, MD;  Location: [facility] ORS;  Service: Ophthalmology;  Laterality: Left;  CDE:3.71  . CATARACT EXTRACTION W/PHACO Right 03/30/2015   Procedure: CATARACT EXTRACTION PHACO AND INTRAOCULAR LENS PLACEMENT RIGHT EYE CDE=2.56;  Surgeon: Williams Che, MD;  Location: [facility] ORS;  Service: Ophthalmology;  Laterality: Right;  . CIRCUMCISION  10/2003  . COLONOSCOPY N/A 07/11/2012   Procedure: COLONOSCOPY;  Surgeon: Rogene Houston, MD;  [facility]: [facility];  Service: Endoscopy;  Laterality: N/A;  830-moved to 930 Ann to notify pt  . CORONARY ARTERY BYPASS GRAFT  08/2000   Dr. Servando Snare - LIMA to LAD, SVG to diagonal, SVG to PDA TRIPLE BYPASS  . CYSTOSCOPY W/ RETROGRADES Bilateral 03/02/2015   Procedure: CYSTOSCOPY WITH RIGHT RETROGRADE PYELOGRAM,  ATTEMPTED LEFT RETROGRADE PYELOGRAM;  Surgeon: Cleon Gustin, MD;  [facility];  Service: Urology;  Laterality: Bilateral;  . ESOPHAGEAL DILATION N/A 03/28/2018   Procedure: ESOPHAGEAL DILATION;  Surgeon: Rogene Houston, MD;  [facility];  Service: Endoscopy;  Laterality: N/A;  . ESOPHAGOGASTRODUODENOSCOPY N/A 03/28/2018   Procedure: ESOPHAGOGASTRODUODENOSCOPY (EGD);  Surgeon: Rogene Houston, MD;  [facility];  Service: Endoscopy;  Laterality: N/A;  120  .  TONSILLECTOMY    . TRANSURETHRAL RESECTION OF BLADDER TUMOR N/A 01/26/2015   Procedure: TRANSURETHRAL RESECTION OF BLADDER TUMOR (TURBT);  Surgeon: Cleon Gustin, MD;  [facility];  Service: Urology;  Laterality: N/A;  . TRANSURETHRAL RESECTION OF BLADDER TUMOR WITH GYRUS (TURBT-GYRUS) N/A 03/02/2015   Procedure: TRANSURETHRAL RESECTION OF BLADDER TUMOR WITH GYRUS (TURBT-GYRUS);  Surgeon: Cleon Gustin, MD;   [facility];  Service: Urology;  Laterality: N/A;  . TRANSURETHRAL RESECTION OF PROSTATE N/A 01/26/2015   Procedure: TRANSURETHRAL RESECTION OF THE PROSTATE WITH GYRUS INSTRUMENTS;  Surgeon: Cleon Gustin, MD;  [facility];  Service: Urology;  Laterality: N/A;    Current Outpatient Medications  Medication Sig Dispense Refill  . albuterol (PROVENTIL HFA;VENTOLIN HFA) 108 (90 Base) MCG/ACT inhaler Inhale 2 puffs into the lungs every 6 (six) hours as needed for wheezing or shortness of breath.    . ARIPiprazole (ABILIFY) 5 MG tablet Take 5 mg by mouth daily.     Marland Kitchen aspirin EC 81 MG tablet Take 1 tablet (81 mg total) by mouth daily.    Marland Kitchen atorvastatin (LIPITOR) 80 MG tablet Take 80 mg by mouth at bedtime.      . carbamazepine (TEGRETOL) 200 MG tablet Take 400 mg by mouth 2 (two) times daily.     . divalproex (DEPAKOTE ER) 250 MG 24 hr tablet Take 500 mg by mouth every morning.    . famotidine (PEPCID) 20 MG tablet Take 2 tablets (40 mg total) by mouth at bedtime. 60 tablet 5  . famotidine (PEPCID) 40 MG tablet TAKE (1) TABLET BY MOUTH AT BEDTIME. 30 tablet 5  . furosemide (LASIX) 40 MG tablet Take 40 mg by mouth daily. May take additional tablet for 3 lbs wt gain over night    . lisinopril (PRINIVIL,ZESTRIL) 5 MG tablet TAKE (1) TABLET BY MOUTH ONCE DAILY. 90 tablet 3  . LORazepam (ATIVAN) 0.5 MG tablet Take 0.5 mg by mouth at bedtime.    . Psyllium (METAMUCIL FIBER PO) Take 2 capsules by mouth daily.    . tamsulosin (FLOMAX) 0.4 MG CAPS capsule Take 0.4 mg by mouth at bedtime.    . temazepam (RESTORIL) 15 MG capsule Take 15 mg by mouth at bedtime as needed for sleep.     No current facility-administered medications for this visit.   Allergies:  Patient has no known allergies.   ROS:   No palpitations or syncope.  Physical Exam: VS:  BP 128/80   Pulse 74   Temp 99.2 F (37.3 C)   Ht 6' (1.829 m)   Wt 222 lb 9.6 oz (101 kg)   SpO2 97%   BMI 30.19 kg/m , BMI Body mass index  is 30.19 kg/m.  Wt Readings from Last 3 Encounters:  04/17/19 222 lb 9.6 oz (101 kg)  02/13/19 220 lb (99.8 kg)  11/12/18 225 lb (102.1 kg)    General: Elderly male, appears comfortable at rest. HEENT: Conjunctiva and lids normal, wearing a mask. Neck: Supple, no elevated JVP or carotid bruits, no thyromegaly. Lungs: Clear to auscultation, nonlabored breathing at rest. Cardiac: Regular rate and rhythm, no S3, soft systolic murmur. Abdomen: Soft, nontender, bowel sounds present. Extremities: No pitting edema, distal pulses 2+.  ECG:  An ECG dated 03/15/2018 was personally reviewed today and demonstrated:  Sinus rhythm with prolonged PR interval, right bundle branch block, old inferior/anterolateral infarct pattern.  Recent Labwork:  No interval lab work for review today.  Other Studies Reviewed Today:  Echocardiogram 10/24/2018: 1. Left ventricular ejection fraction, by visual estimation, is 30 to  35%. The left ventricle has moderate to severely decreased function.  Normal left ventricular size. There is moderately increased left  ventricular hypertrophy.  2. Entire inferior wall, basal and mid inferolateral wall, apical septal  segment, and apex are abnormal.  3. Left ventricular diastolic Doppler parameters are consistent with  impaired relaxation pattern of LV diastolic filling.  4. Global right ventricle has normal systolic function.The right  ventricular size is normal. No increase in right ventricular wall  thickness.  5. Left atrial size was moderately dilated.  6. Right atrial size was normal.  7. Moderate aortic valve annular calcification.  8. Moderate mitral annular calcification.  9. The mitral valve is grossly normal. Mild mitral valve regurgitation.  10. The tricuspid valve is grossly normal. Tricuspid valve regurgitation  is trivial.  11. The aortic valve is tricuspid Aortic valve regurgitation was not  visualized by color flow Doppler. Mild aortic  valve sclerosis without  stenosis.  12. The pulmonic valve was grossly normal. Pulmonic valve regurgitation is  trivial by color flow Doppler.  13. The inferior vena cava is normal in size with greater than 50%  respiratory variability, suggesting right atrial pressure of 3 mmHg.   Assessment and Plan:  1.  Ischemic cardiomyopathy with stable LVEF in the range of 30 to 35%.  He continues on aspirin, Lipitor, Lasix, and lisinopril.  Not on beta-blocker with conduction system disease by ECG.  He has declined ICD.  We have discussed possible conversion in medical therapy including a change to [facility], although he prefers to stick with the same regimen, clinically stable.  2.  Multivessel CAD status post CABG in 2002.  No active angina at this time.  Medication Adjustments/Labs and Tests Ordered: Current medicines are reviewed at length with the patient today.  Concerns regarding medicines are outlined above.   Tests Ordered: Orders Placed This Encounter  Procedures  . EKG 12-Lead    Medication Changes: No orders of the defined types were placed in this encounter.   Disposition:  Follow up 6 months in the [place] office.  Signed, [name], MD, [facility] 04/17/2019 2:09 PM    [facility] at [facility] [address] [place], [place] [address] Phone: [phone]; Fax: [phone]

## 2019-04-19 ENCOUNTER — Other Ambulatory Visit: Payer: Self-pay | Admitting: Cardiology

## 2019-04-22 DIAGNOSIS — M79674 Pain in right toe(s): Secondary | ICD-10-CM | POA: Diagnosis not present

## 2019-04-22 DIAGNOSIS — L600 Ingrowing nail: Secondary | ICD-10-CM | POA: Diagnosis not present

## 2019-04-22 DIAGNOSIS — B070 Plantar wart: Secondary | ICD-10-CM | POA: Diagnosis not present

## 2019-05-20 DIAGNOSIS — B070 Plantar wart: Secondary | ICD-10-CM | POA: Diagnosis not present

## 2019-05-20 DIAGNOSIS — M79672 Pain in left foot: Secondary | ICD-10-CM | POA: Diagnosis not present

## 2019-06-03 DIAGNOSIS — Z961 Presence of intraocular lens: Secondary | ICD-10-CM | POA: Diagnosis not present

## 2019-06-03 DIAGNOSIS — H04123 Dry eye syndrome of bilateral lacrimal glands: Secondary | ICD-10-CM | POA: Diagnosis not present

## 2019-06-03 DIAGNOSIS — H0102B Squamous blepharitis left eye, upper and lower eyelids: Secondary | ICD-10-CM | POA: Diagnosis not present

## 2019-06-03 DIAGNOSIS — H26493 Other secondary cataract, bilateral: Secondary | ICD-10-CM | POA: Diagnosis not present

## 2019-06-03 DIAGNOSIS — H353132 Nonexudative age-related macular degeneration, bilateral, intermediate dry stage: Secondary | ICD-10-CM | POA: Diagnosis not present

## 2019-06-03 DIAGNOSIS — H0102A Squamous blepharitis right eye, upper and lower eyelids: Secondary | ICD-10-CM | POA: Diagnosis not present

## 2019-06-24 DIAGNOSIS — F3110 Bipolar disorder, current episode manic without psychotic features, unspecified: Secondary | ICD-10-CM | POA: Diagnosis not present

## 2019-06-25 DIAGNOSIS — Z79899 Other long term (current) drug therapy: Secondary | ICD-10-CM | POA: Diagnosis not present

## 2019-07-02 DIAGNOSIS — Z79899 Other long term (current) drug therapy: Secondary | ICD-10-CM | POA: Diagnosis not present

## 2019-07-02 DIAGNOSIS — D696 Thrombocytopenia, unspecified: Secondary | ICD-10-CM | POA: Diagnosis not present

## 2019-07-02 DIAGNOSIS — F319 Bipolar disorder, unspecified: Secondary | ICD-10-CM | POA: Diagnosis not present

## 2019-07-02 DIAGNOSIS — N183 Chronic kidney disease, stage 3 unspecified: Secondary | ICD-10-CM | POA: Diagnosis not present

## 2019-07-02 DIAGNOSIS — I2510 Atherosclerotic heart disease of native coronary artery without angina pectoris: Secondary | ICD-10-CM | POA: Diagnosis not present

## 2019-07-02 DIAGNOSIS — I5022 Chronic systolic (congestive) heart failure: Secondary | ICD-10-CM | POA: Diagnosis not present

## 2019-07-24 ENCOUNTER — Other Ambulatory Visit: Payer: Self-pay | Admitting: Cardiology

## 2019-08-05 DIAGNOSIS — N1831 Chronic kidney disease, stage 3a: Secondary | ICD-10-CM | POA: Diagnosis not present

## 2019-08-05 DIAGNOSIS — D696 Thrombocytopenia, unspecified: Secondary | ICD-10-CM | POA: Diagnosis not present

## 2019-08-05 DIAGNOSIS — I5022 Chronic systolic (congestive) heart failure: Secondary | ICD-10-CM | POA: Diagnosis not present

## 2019-09-28 ENCOUNTER — Other Ambulatory Visit (INDEPENDENT_AMBULATORY_CARE_PROVIDER_SITE_OTHER): Payer: Self-pay | Admitting: Gastroenterology

## 2019-09-30 DIAGNOSIS — H0102B Squamous blepharitis left eye, upper and lower eyelids: Secondary | ICD-10-CM | POA: Diagnosis not present

## 2019-09-30 DIAGNOSIS — H0102A Squamous blepharitis right eye, upper and lower eyelids: Secondary | ICD-10-CM | POA: Diagnosis not present

## 2019-09-30 DIAGNOSIS — F3110 Bipolar disorder, current episode manic without psychotic features, unspecified: Secondary | ICD-10-CM | POA: Diagnosis not present

## 2019-09-30 DIAGNOSIS — H04123 Dry eye syndrome of bilateral lacrimal glands: Secondary | ICD-10-CM | POA: Diagnosis not present

## 2019-10-22 ENCOUNTER — Ambulatory Visit: Payer: PPO | Admitting: Cardiology

## 2019-10-22 DIAGNOSIS — H0102B Squamous blepharitis left eye, upper and lower eyelids: Secondary | ICD-10-CM | POA: Diagnosis not present

## 2019-10-22 DIAGNOSIS — H04123 Dry eye syndrome of bilateral lacrimal glands: Secondary | ICD-10-CM | POA: Diagnosis not present

## 2019-10-22 DIAGNOSIS — H0102A Squamous blepharitis right eye, upper and lower eyelids: Secondary | ICD-10-CM | POA: Diagnosis not present

## 2019-10-23 DIAGNOSIS — Z23 Encounter for immunization: Secondary | ICD-10-CM | POA: Diagnosis not present

## 2019-10-31 DIAGNOSIS — I5022 Chronic systolic (congestive) heart failure: Secondary | ICD-10-CM | POA: Diagnosis not present

## 2019-10-31 DIAGNOSIS — N183 Chronic kidney disease, stage 3 unspecified: Secondary | ICD-10-CM | POA: Diagnosis not present

## 2019-10-31 DIAGNOSIS — F3181 Bipolar II disorder: Secondary | ICD-10-CM | POA: Diagnosis not present

## 2019-10-31 DIAGNOSIS — Z79899 Other long term (current) drug therapy: Secondary | ICD-10-CM | POA: Diagnosis not present

## 2019-11-01 DIAGNOSIS — I5022 Chronic systolic (congestive) heart failure: Secondary | ICD-10-CM | POA: Diagnosis not present

## 2019-11-01 DIAGNOSIS — R197 Diarrhea, unspecified: Secondary | ICD-10-CM | POA: Diagnosis not present

## 2019-11-01 DIAGNOSIS — E871 Hypo-osmolality and hyponatremia: Secondary | ICD-10-CM | POA: Diagnosis not present

## 2019-11-18 DIAGNOSIS — L600 Ingrowing nail: Secondary | ICD-10-CM | POA: Diagnosis not present

## 2019-11-28 ENCOUNTER — Other Ambulatory Visit (INDEPENDENT_AMBULATORY_CARE_PROVIDER_SITE_OTHER): Payer: Self-pay | Admitting: Gastroenterology

## 2019-11-28 NOTE — Telephone Encounter (Signed)
JAn

## 2019-12-02 DIAGNOSIS — M79674 Pain in right toe(s): Secondary | ICD-10-CM | POA: Diagnosis not present

## 2019-12-02 DIAGNOSIS — L600 Ingrowing nail: Secondary | ICD-10-CM | POA: Diagnosis not present

## 2019-12-12 ENCOUNTER — Encounter: Payer: Self-pay | Admitting: Student

## 2019-12-12 ENCOUNTER — Ambulatory Visit: Payer: PPO | Admitting: Student

## 2019-12-12 ENCOUNTER — Other Ambulatory Visit: Payer: Self-pay

## 2019-12-12 VITALS — BP 118/70 | HR 72 | Ht 72.0 in | Wt 199.8 lb

## 2019-12-12 DIAGNOSIS — E785 Hyperlipidemia, unspecified: Secondary | ICD-10-CM | POA: Diagnosis not present

## 2019-12-12 DIAGNOSIS — I5042 Chronic combined systolic (congestive) and diastolic (congestive) heart failure: Secondary | ICD-10-CM | POA: Diagnosis not present

## 2019-12-12 DIAGNOSIS — I255 Ischemic cardiomyopathy: Secondary | ICD-10-CM | POA: Diagnosis not present

## 2019-12-12 DIAGNOSIS — I2510 Atherosclerotic heart disease of native coronary artery without angina pectoris: Secondary | ICD-10-CM | POA: Diagnosis not present

## 2019-12-12 DIAGNOSIS — I10 Essential (primary) hypertension: Secondary | ICD-10-CM | POA: Diagnosis not present

## 2019-12-12 NOTE — Progress Notes (Signed)
Cardiology Office Note    Date:  12/12/2019   ID:  Grant Stevens, DOB [birth year], MRN [id number]  PCP:  [name], MD  Cardiologist: Rozann Lesches, MD    Chief Complaint  Patient presents with  . Follow-up    6 month visit    History of Present Illness:    Grant Stevens is a 79 y.o. male with past medical history of CAD (s/p CABG in 2002), chronic combined systolic and diastolic CHF/ICM (EF 10-93% by echo in 10/2018 and he had declined ICD placement), HTN and HLD who presents to the office today for 50-month follow-up.   He was last examined by Dr. Domenic Polite in 04/2019 and remained active at that time, still going to the gym 3 days per week. Options were reviewed in regards to switching to [facility] for his cardiomyopathy at that time but he wished to continue with his current medical therapy given stability in his symptoms. He was not on a BB given his conduction disease at baseline by EKG (1st degree AV Block and RBBB).   In talking with the patient today, he reports overall doing well since his last visit. He continues to go to the gym several days per week and walks on the treadmill along with utilizing an exercise bike. He also enjoys walking at the local park with his girlfriend when weather permits. He denies any recent chest pain or dyspnea on exertion. No recent orthopnea, PND, lower extreme edema or palpitations. He has been suffering from an ingrown toenail and is being followed by Podiatry.  Past Medical History:  Diagnosis Date  . Allergic rhinitis   . Arthritis   . Asthma    Childhood  . Atrial fibrillation ([place])    Remote history - WARCEF  . Bladder cancer ([place])   . BPH (benign prostatic hyperplasia)   . Cardiomyopathy ([place])   . CHF (congestive heart failure) ([place])   . Coronary atherosclerosis of native coronary artery    Multivessel s/p CABG in 06/2001 post-IMI, LVEF 35% up to 50% postoperatively;  . Depression   . Difficulty sleeping   . Erectile  dysfunction   . Essential hypertension   . Frequency of urination   . History of bipolar disorder   . Hyperlipidemia   . IBS (irritable bowel syndrome)   . Myocardial infarction [facility]) 2002    Past Surgical History:  Procedure Laterality Date  . BIOPSY  03/28/2018   Procedure: BIOPSY;  Surgeon: Rogene Houston, MD;  Location: [facility];  Service: Endoscopy;;  esophagus  . CATARACT EXTRACTION W/PHACO Left 12/29/2014   Procedure: CATARACT EXTRACTION PHACO AND INTRAOCULAR LENS PLACEMENT (IOC);  Surgeon: Williams Che, MD;  Location: [facility] ORS;  Service: Ophthalmology;  Laterality: Left;  CDE:3.71  . CATARACT EXTRACTION W/PHACO Right 03/30/2015   Procedure: CATARACT EXTRACTION PHACO AND INTRAOCULAR LENS PLACEMENT RIGHT EYE CDE=2.56;  Surgeon: Williams Che, MD;  Location: [facility] ORS;  Service: Ophthalmology;  Laterality: Right;  . CIRCUMCISION  10/2003  . COLONOSCOPY N/A 07/11/2012   Procedure: COLONOSCOPY;  Surgeon: Rogene Houston, MD;  Location: [facility];  Service: Endoscopy;  Laterality: N/A;  830-moved to 930 Ann to notify pt  . CORONARY ARTERY BYPASS GRAFT  08/2000   Dr. Servando Snare - LIMA to LAD, SVG to diagonal, SVG to PDA TRIPLE BYPASS  . CYSTOSCOPY W/ RETROGRADES Bilateral 03/02/2015   Procedure: CYSTOSCOPY WITH RIGHT RETROGRADE PYELOGRAM,  ATTEMPTED LEFT RETROGRADE PYELOGRAM;  Surgeon: Cleon Gustin, MD;  Location: [facility] ORS;  Service: Urology;  Laterality: Bilateral;  . ESOPHAGEAL DILATION N/A 03/28/2018   Procedure: ESOPHAGEAL DILATION;  Surgeon: Rogene Houston, MD;  Location: [facility];  Service: Endoscopy;  Laterality: N/A;  . ESOPHAGOGASTRODUODENOSCOPY N/A 03/28/2018   Procedure: ESOPHAGOGASTRODUODENOSCOPY (EGD);  Surgeon: Rogene Houston, MD;  Location: [facility];  Service: Endoscopy;  Laterality: N/A;  120  . TONSILLECTOMY    . TRANSURETHRAL RESECTION OF BLADDER TUMOR N/A 01/26/2015   Procedure: TRANSURETHRAL RESECTION OF BLADDER TUMOR (TURBT);  Surgeon: Cleon Gustin, MD;  Location: [facility];  Service: Urology;  Laterality: N/A;  . TRANSURETHRAL RESECTION OF BLADDER TUMOR WITH GYRUS (TURBT-GYRUS) N/A 03/02/2015   Procedure: TRANSURETHRAL RESECTION OF BLADDER TUMOR WITH GYRUS (TURBT-GYRUS);  Surgeon: Cleon Gustin, MD;  Location: [facility];  Service: Urology;  Laterality: N/A;  . TRANSURETHRAL RESECTION OF PROSTATE N/A 01/26/2015   Procedure: TRANSURETHRAL RESECTION OF THE PROSTATE WITH GYRUS INSTRUMENTS;  Surgeon: Cleon Gustin, MD;  Location: [facility];  Service: Urology;  Laterality: N/A;    Current Medications: Outpatient Medications Prior to Visit  Medication Sig Dispense Refill  . aspirin EC 81 MG tablet Take 1 tablet (81 mg total) by mouth daily.    Marland Kitchen atorvastatin (LIPITOR) 80 MG tablet Take 80 mg by mouth at bedtime.      . carbamazepine (TEGRETOL) 200 MG tablet Take 400 mg by mouth 2 (two) times daily.     . divalproex (DEPAKOTE ER) 250 MG 24 hr tablet Take 500 mg by mouth in the morning and at bedtime.     . famotidine (PEPCID) 40 MG tablet Take 1 tablet PO at bedtime. Due for yearly office visit for refills. 30 tablet 1  . furosemide (LASIX) 40 MG tablet Take 40 mg by mouth daily. May take additional tablet for 3 lbs wt gain over night    . lisinopril (ZESTRIL) 5 MG tablet TAKE (1) TABLET BY MOUTH ONCE DAILY. 90 tablet 3  . LORazepam (ATIVAN) 0.5 MG tablet Take 0.5 mg by mouth at bedtime.    . Psyllium (METAMUCIL FIBER PO) Take 2 capsules by mouth daily.    . tamsulosin (FLOMAX) 0.4 MG CAPS capsule Take 0.4 mg by mouth at bedtime.    . temazepam (RESTORIL) 15 MG capsule Take 15 mg by mouth at bedtime as needed for sleep.    Marland Kitchen albuterol (PROVENTIL HFA;VENTOLIN HFA) 108 (90 Base) MCG/ACT inhaler Inhale 2 puffs into the lungs every 6 (six) hours as needed for wheezing or shortness of breath.    . ARIPiprazole (ABILIFY) 5 MG tablet Take 5 mg by mouth daily.     . famotidine (PEPCID) 20 MG tablet Take 2 tablets (40 mg total) by mouth at  bedtime. 60 tablet 5   No facility-administered medications prior to visit.     Allergies:   Patient has no known allergies.   Social History   Socioeconomic History  . Marital status: Widowed    Spouse name: Not on file  . Number of children: Not on file  . Years of education: Not on file  . Highest education level: Not on file  Occupational History  . Not on file  Tobacco Use  . Smoking status: Former Smoker    Packs/day: 1.00    Years: 15.00    Pack years: 15.00    Types: Cigarettes    Quit date: 01/11/1971    Years since quitting: 48.9  . Smokeless tobacco: Never Used  Vaping Use  .  Vaping Use: Never used  Substance and Sexual Activity  . Alcohol use: Not Currently    Comment: occ.  . Drug use: No  . Sexual activity: Not on file  Other Topics Concern  . Not on file  Social History Narrative   Married with no children    Exercises 4 times weekly   Caffeine once a day   Social Determinants of Health   Financial Resource Strain:   . Difficulty of Paying Living Expenses: Not on file  Food Insecurity:   . Worried About Charity fundraiser in the Last Year: Not on file  . Ran Out of Food in the Last Year: Not on file  Transportation Needs:   . Lack of Transportation (Medical): Not on file  . Lack of Transportation (Non-Medical): Not on file  Physical Activity:   . Days of Exercise per Week: Not on file  . Minutes of Exercise per Session: Not on file  Stress:   . Feeling of Stress : Not on file  Social Connections:   . Frequency of Communication with Friends and Family: Not on file  . Frequency of Social Gatherings with Friends and Family: Not on file  . Attends Religious Services: Not on file  . Active Member of Clubs or Organizations: Not on file  . Attends Archivist Meetings: Not on file  . Marital Status: Not on file     Family History:  The patient's family history includes Asthma in his mother; Diabetes in his brother; Heart attack (age of  onset: 19) in his father.   Review of Systems:   Please see the history of present illness.     General:  No chills, fever, night sweats or weight changes.  Cardiovascular:  No chest pain, dyspnea on exertion, edema, orthopnea, palpitations, paroxysmal nocturnal dyspnea. Dermatological: No rash, lesions/masses Respiratory: No cough, dyspnea Urologic: No hematuria, dysuria Abdominal:   No nausea, vomiting, diarrhea, bright red blood per rectum, melena, or hematemesis Neurologic:  No visual changes, wkns, changes in mental status. All other systems reviewed and are otherwise negative except as noted above.   Physical Exam:    VS:  BP 118/70   Pulse 72   Ht 6' (1.829 m)   Wt 199 lb 12.8 oz (90.6 kg)   SpO2 96%   BMI 27.10 kg/m    General: Well developed, well nourished,male appearing in no acute distress. Head: Normocephalic, atraumatic. Neck: No carotid bruits. JVD not elevated.  Lungs: Respirations regular and unlabored, without wheezes or rales.  Heart: Regular rate and rhythm. No S3 or S4.  No murmur, no rubs, or gallops appreciated. Abdomen: Appears non-distended. No obvious abdominal masses. Msk:  Strength and tone appear normal for age. No obvious joint deformities or effusions. Extremities: No clubbing or cyanosis. No edema.  Distal pedal pulses are 2+ bilaterally. Right great toe without erythema or drainage following recent procedure.  Neuro: Alert and oriented X 3. Moves all extremities spontaneously. No focal deficits noted. Psych:  Responds to questions appropriately with a normal affect. Skin: No rashes or lesions noted  Wt Readings from Last 3 Encounters:  12/12/19 199 lb 12.8 oz (90.6 kg)  04/17/19 222 lb 9.6 oz (101 kg)  02/13/19 220 lb (99.8 kg)     Studies/Labs Reviewed:   EKG:  EKG is not ordered today.    Recent Labs: No results found for requested labs within last 8760 hours.   Lipid Panel    Component Value Date/Time  CHOL 135 11/30/2009 2018     TRIG 62 11/30/2009 2018   HDL 47 11/30/2009 2018   CHOLHDL 2.9 Ratio 11/30/2009 2018   VLDL 12 11/30/2009 2018   LDLCALC 76 11/30/2009 2018    Additional studies/ records that were reviewed today include:   NST: 11/2013 IMPRESSION: 1. Large fixed scar of the entire inferior wall, there is no peri-infarct ischemia.  2. There is inferior wall hypokinesis. The left ventricle is dilated by volume.  3. Left ventricular ejection fraction 32%  4. Negative stress EKG for ischemia. Duke treadmill score of 7 consistent with low risk for major cardiac events. Excellent functional capacity (140% of predicted based on age and gender)  5. Overall high risk study for major cardiac events due to low ejection fraction, imaging shows no current myocardium at jeopardy. Consider correlating LVEF by echo. Exercise portion of test and Duke treadmill score suggest lower risk.   Echocardiogram: 10/2018 IMPRESSIONS    1. Left ventricular ejection fraction, by visual estimation, is 30 to  35%. The left ventricle has moderate to severely decreased function.  Normal left ventricular size. There is moderately increased left  ventricular hypertrophy.  2. Entire inferior wall, basal and mid inferolateral wall, apical septal  segment, and apex are abnormal.  3. Left ventricular diastolic Doppler parameters are consistent with  impaired relaxation pattern of LV diastolic filling.  4. Global right ventricle has normal systolic function.The right  ventricular size is normal. No increase in right ventricular wall  thickness.  5. Left atrial size was moderately dilated.  6. Right atrial size was normal.  7. Moderate aortic valve annular calcification.  8. Moderate mitral annular calcification.  9. The mitral valve is grossly normal. Mild mitral valve regurgitation.  10. The tricuspid valve is grossly normal. Tricuspid valve regurgitation  is trivial.  11. The aortic valve is tricuspid  Aortic valve regurgitation was not  visualized by color flow Doppler. Mild aortic valve sclerosis without  stenosis.  12. The pulmonic valve was grossly normal. Pulmonic valve regurgitation is  trivial by color flow Doppler.  13. The inferior vena cava is normal in size with greater than 50%  respiratory variability, suggesting right atrial pressure of 3 mmHg.   Assessment:    1. Coronary artery disease involving native coronary artery of native heart without angina pectoris   2. Chronic combined systolic and diastolic heart failure (HCC)   3. Cardiomyopathy, ischemic   4. Essential hypertension   5. Hyperlipidemia LDL goal <70      Plan:   In order of problems listed above:  1. CAD - He is s/p CABG in 2002 and most recent ischemic evaluation was in NST in 11/2013 which showed no evidence of ischemia. - He remains active at baseline and denies any anginal symptoms. Continue current medication regimen with ASA 81 mg daily, Atorvastatin 80 mg daily and Lisinopril 5 mg daily.  2. Chronic Combined Systolic and Diastolic CHF/Ischemic Cardiomyopathy - He has a known reduced EF of 30 to 35% by most recent echocardiogram in 10/2018 which was consistent with prior imaging. He denies any recent orthopnea, PND or lower extremity edema. Appears euvolemic by examination today. He remains on Lasix 40 mg daily and Lisinopril 5 mg daily. We reviewed again today possible adjustments in medical therapy but he wishes to continue his current regimen at this time. If he did develop symptoms in the future, would consider adding Spironolactone or switching from Lisinopril to [facility] following a 36-hour washout period.  He has not been on a BB given his baseline conduction disease. He also previously declined ICD placement.  3. HTN - BP is well controlled at 118/70 during today's visit. Continue current medication regimen with Lisinopril 5 mg daily.  4. HLD - He reports having labs with his PCP within the  past 3 months. Will request a copy of the records. He remains on Atorvastatin 80 mg daily with goal LDL less than 70 in the setting of known CAD.    Medication Adjustments/Labs and Tests Ordered: Current medicines are reviewed at length with the patient today.  Concerns regarding medicines are outlined above.  Medication changes, Labs and Tests ordered today are listed in the Patient Instructions below. Patient Instructions  Medication Instructions:  Your physician recommends that you continue on your current medications as directed. Please refer to the Current Medication list given to you today.  *If you need a refill on your cardiac medications before your next appointment, please call your pharmacy*   Lab Work: NONE   If you have labs (blood work) drawn today and your tests are completely normal, you will receive your results only by: Marland Kitchen MyChart Message (if you have MyChart) OR . A paper copy in the mail If you have any lab test that is abnormal or we need to change your treatment, we will call you to review the results.   Testing/Procedures: NONE    Follow-Up: At [facility], you and your health needs are our priority.  As part of our continuing mission to provide you with exceptional heart care, we have created designated Provider Care Teams.  These Care Teams include your primary Cardiologist (physician) and Advanced Practice Providers (APPs -  Physician Assistants and Nurse Practitioners) who all work together to provide you with the care you need, when you need it.  We recommend signing up for the patient portal called "MyChart".  Sign up information is provided on this After Visit Summary.  MyChart is used to connect with patients for Virtual Visits (Telemedicine).  Patients are able to view lab/test results, encounter notes, upcoming appointments, etc.  Non-urgent messages can be sent to your provider as well.   To learn more about what you can do with MyChart, go to  [email or web address].    Your next appointment:   6 month(s)  The format for your next appointment:   In Person  Provider:   Dr. Domenic Polite    Other Instructions Thank you for choosing [facility]!       Signed, Erma Heritage, PA-C  12/12/2019 5:28 PM    [address] [place], [place] [address] Phone: [phone] Fax: [phone]

## 2019-12-12 NOTE — Patient Instructions (Signed)
Medication Instructions:  Your physician recommends that you continue on your current medications as directed. Please refer to the Current Medication list given to you today.  *If you need a refill on your cardiac medications before your next appointment, please call your pharmacy*   Lab Work: NONE   If you have labs (blood work) drawn today and your tests are completely normal, you will receive your results only by: Marland Kitchen MyChart Message (if you have MyChart) OR . A paper copy in the mail If you have any lab test that is abnormal or we need to change your treatment, we will call you to review the results.   Testing/Procedures: NONE    Follow-Up: At [facility], you and your health needs are our priority.  As part of our continuing mission to provide you with exceptional heart care, we have created designated Provider Care Teams.  These Care Teams include your primary Cardiologist (physician) and Advanced Practice Providers (APPs -  Physician Assistants and Nurse Practitioners) who all work together to provide you with the care you need, when you need it.  We recommend signing up for the patient portal called "MyChart".  Sign up information is provided on this After Visit Summary.  MyChart is used to connect with patients for Virtual Visits (Telemedicine).  Patients are able to view lab/test results, encounter notes, upcoming appointments, etc.  Non-urgent messages can be sent to your provider as well.   To learn more about what you can do with MyChart, go to [email or web address].    Your next appointment:   6 month(s)  The format for your next appointment:   In Person  Provider:   Dr. Domenic Polite    Other Instructions Thank you for choosing [facility]!

## 2019-12-30 DIAGNOSIS — F3110 Bipolar disorder, current episode manic without psychotic features, unspecified: Secondary | ICD-10-CM | POA: Diagnosis not present

## 2020-01-28 DIAGNOSIS — I5022 Chronic systolic (congestive) heart failure: Secondary | ICD-10-CM | POA: Diagnosis not present

## 2020-01-28 DIAGNOSIS — D696 Thrombocytopenia, unspecified: Secondary | ICD-10-CM | POA: Diagnosis not present

## 2020-01-28 DIAGNOSIS — I2510 Atherosclerotic heart disease of native coronary artery without angina pectoris: Secondary | ICD-10-CM | POA: Diagnosis not present

## 2020-01-28 DIAGNOSIS — E871 Hypo-osmolality and hyponatremia: Secondary | ICD-10-CM | POA: Diagnosis not present

## 2020-01-28 DIAGNOSIS — Z79899 Other long term (current) drug therapy: Secondary | ICD-10-CM | POA: Diagnosis not present

## 2020-01-31 ENCOUNTER — Other Ambulatory Visit (INDEPENDENT_AMBULATORY_CARE_PROVIDER_SITE_OTHER): Payer: Self-pay

## 2020-01-31 MED ORDER — FAMOTIDINE 40 MG PO TABS: ORAL_TABLET | ORAL | 0 refills | Status: DC

## 2020-01-31 NOTE — Telephone Encounter (Signed)
Will refill only for a month as patient has not bee seen in clinic for more than a year, will need follow up appointment with [place] or PCP

## 2020-02-03 DIAGNOSIS — D696 Thrombocytopenia, unspecified: Secondary | ICD-10-CM | POA: Diagnosis not present

## 2020-02-03 DIAGNOSIS — E871 Hypo-osmolality and hyponatremia: Secondary | ICD-10-CM | POA: Diagnosis not present

## 2020-02-03 DIAGNOSIS — E785 Hyperlipidemia, unspecified: Secondary | ICD-10-CM | POA: Diagnosis not present

## 2020-02-03 DIAGNOSIS — I5022 Chronic systolic (congestive) heart failure: Secondary | ICD-10-CM | POA: Diagnosis not present

## 2020-02-11 ENCOUNTER — Encounter (HOSPITAL_COMMUNITY): Payer: Self-pay | Admitting: *Deleted

## 2020-02-11 ENCOUNTER — Emergency Department (HOSPITAL_COMMUNITY)
Admission: EM | Admit: 2020-02-11 | Discharge: 2020-02-11 | Disposition: A | Discharge status: Home / Self Care | Payer: PPO | Attending: Emergency Medicine | Admitting: Emergency Medicine

## 2020-02-11 ENCOUNTER — Ambulatory Visit
Admission: EM | Admit: 2020-02-11 | Discharge: 2020-02-11 | Payer: PPO | Attending: Family Medicine | Admitting: Family Medicine

## 2020-02-11 ENCOUNTER — Emergency Department (HOSPITAL_COMMUNITY): Payer: PPO

## 2020-02-11 ENCOUNTER — Other Ambulatory Visit: Payer: Self-pay

## 2020-02-11 ENCOUNTER — Encounter: Payer: Self-pay | Admitting: Emergency Medicine

## 2020-02-11 DIAGNOSIS — M40204 Unspecified kyphosis, thoracic region: Secondary | ICD-10-CM | POA: Diagnosis not present

## 2020-02-11 DIAGNOSIS — Z20822 Contact with and (suspected) exposure to covid-19: Secondary | ICD-10-CM | POA: Insufficient documentation

## 2020-02-11 DIAGNOSIS — E860 Dehydration: Secondary | ICD-10-CM

## 2020-02-11 DIAGNOSIS — K449 Diaphragmatic hernia without obstruction or gangrene: Secondary | ICD-10-CM | POA: Diagnosis not present

## 2020-02-11 DIAGNOSIS — I4891 Unspecified atrial fibrillation: Secondary | ICD-10-CM | POA: Diagnosis not present

## 2020-02-11 DIAGNOSIS — R531 Weakness: Secondary | ICD-10-CM | POA: Diagnosis not present

## 2020-02-11 DIAGNOSIS — J45909 Unspecified asthma, uncomplicated: Secondary | ICD-10-CM | POA: Diagnosis not present

## 2020-02-11 DIAGNOSIS — I509 Heart failure, unspecified: Secondary | ICD-10-CM | POA: Insufficient documentation

## 2020-02-11 DIAGNOSIS — Z7982 Long term (current) use of aspirin: Secondary | ICD-10-CM | POA: Diagnosis not present

## 2020-02-11 DIAGNOSIS — R42 Dizziness and giddiness: Secondary | ICD-10-CM

## 2020-02-11 DIAGNOSIS — Z79899 Other long term (current) drug therapy: Secondary | ICD-10-CM | POA: Diagnosis not present

## 2020-02-11 DIAGNOSIS — I959 Hypotension, unspecified: Secondary | ICD-10-CM

## 2020-02-11 DIAGNOSIS — R000 Tachycardia, unspecified: Secondary | ICD-10-CM | POA: Diagnosis not present

## 2020-02-11 DIAGNOSIS — I110 Hypertensive heart disease with heart failure: Secondary | ICD-10-CM | POA: Insufficient documentation

## 2020-02-11 DIAGNOSIS — Z8551 Personal history of malignant neoplasm of bladder: Secondary | ICD-10-CM | POA: Diagnosis not present

## 2020-02-11 DIAGNOSIS — R0689 Other abnormalities of breathing: Secondary | ICD-10-CM | POA: Diagnosis not present

## 2020-02-11 DIAGNOSIS — I25811 Atherosclerosis of native coronary artery of transplanted heart without angina pectoris: Secondary | ICD-10-CM | POA: Insufficient documentation

## 2020-02-11 DIAGNOSIS — R197 Diarrhea, unspecified: Secondary | ICD-10-CM | POA: Insufficient documentation

## 2020-02-11 LAB — CBC WITH DIFFERENTIAL/PLATELET
Abs Immature Granulocytes: 0.05 10*3/uL (ref 0.00–0.07)
Basophils Absolute: 0 10*3/uL (ref 0.0–0.1)
Basophils Relative: 0 %
Eosinophils Absolute: 0.1 10*3/uL (ref 0.0–0.5)
Eosinophils Relative: 1 %
HCT: 41.1 % (ref 39.0–52.0)
Hemoglobin: 13.3 g/dL (ref 13.0–17.0)
Immature Granulocytes: 0 %
Lymphocytes Relative: 2 %
Lymphs Abs: 0.3 10*3/uL — ABNORMAL LOW (ref 0.7–4.0)
MCH: 31.2 pg (ref 26.0–34.0)
MCHC: 32.4 g/dL (ref 30.0–36.0)
MCV: 96.5 fL (ref 80.0–100.0)
Monocytes Absolute: 0.6 10*3/uL (ref 0.1–1.0)
Monocytes Relative: 5 %
Neutro Abs: 11.8 10*3/uL — ABNORMAL HIGH (ref 1.7–7.7)
Neutrophils Relative %: 92 %
Platelets: 119 10*3/uL — ABNORMAL LOW (ref 150–400)
RBC: 4.26 MIL/uL (ref 4.22–5.81)
RDW: 13.1 % (ref 11.5–15.5)
WBC: 12.8 10*3/uL — ABNORMAL HIGH (ref 4.0–10.5)
nRBC: 0 % (ref 0.0–0.2)

## 2020-02-11 LAB — COMPREHENSIVE METABOLIC PANEL
ALT: 22 U/L (ref 0–44)
AST: 24 U/L (ref 15–41)
Albumin: 3.9 g/dL (ref 3.5–5.0)
Alkaline Phosphatase: 78 U/L (ref 38–126)
Anion gap: 8 (ref 5–15)
BUN: 27 mg/dL — ABNORMAL HIGH (ref 8–23)
CO2: 24 mmol/L (ref 22–32)
Calcium: 8.5 mg/dL — ABNORMAL LOW (ref 8.9–10.3)
Chloride: 100 mmol/L (ref 98–111)
Creatinine, Ser: 1.34 mg/dL — ABNORMAL HIGH (ref 0.61–1.24)
GFR, Estimated: 54 mL/min — ABNORMAL LOW (ref >60)
Glucose, Bld: 101 mg/dL — ABNORMAL HIGH (ref 70–99)
Potassium: 4.1 mmol/L (ref 3.5–5.1)
Sodium: 132 mmol/L — ABNORMAL LOW (ref 135–145)
Total Bilirubin: 0.7 mg/dL (ref 0.3–1.2)
Total Protein: 6.5 g/dL (ref 6.5–8.1)

## 2020-02-11 LAB — URINALYSIS, ROUTINE W REFLEX MICROSCOPIC
Bilirubin Urine: NEGATIVE
Glucose, UA: NEGATIVE mg/dL
Hgb urine dipstick: NEGATIVE
Ketones, ur: 5 mg/dL — AB
Leukocytes,Ua: NEGATIVE
Nitrite: NEGATIVE
Protein, ur: NEGATIVE mg/dL
Specific Gravity, Urine: >1.046 — ABNORMAL HIGH (ref 1.005–1.030)
pH: 5 (ref 5.0–8.0)

## 2020-02-11 LAB — POC SARS CORONAVIRUS 2 AG -  ED: SARS Coronavirus 2 Ag: NEGATIVE

## 2020-02-11 MED ORDER — LOPERAMIDE HCL 2 MG PO TABS: 2.0000 mg | ORAL_TABLET | Freq: Four times a day (QID) | ORAL | 0 refills | Status: DC | PRN

## 2020-02-11 MED ORDER — SODIUM CHLORIDE 0.9 % IV BOLUS: 1000.0000 mL | Freq: Once | INTRAVENOUS | Status: DC

## 2020-02-11 MED ORDER — SODIUM CHLORIDE 0.9 % IV BOLUS
1000.0000 mL | Freq: Once | INTRAVENOUS | Status: AC
  Administered 2020-02-11: 1000 mL via INTRAVENOUS

## 2020-02-11 MED ORDER — IOHEXOL 300 MG/ML  SOLN
100.0000 mL | Freq: Once | INTRAMUSCULAR | Status: AC | PRN
  Administered 2020-02-11: 100 mL via INTRAVENOUS

## 2020-02-11 NOTE — ED Notes (Signed)

## 2020-02-11 NOTE — ED Provider Notes (Signed)
[facility] Provider Note  CSN: [id number] Arrival date & time: 02/11/20 0936    History Chief Complaint  Patient presents with  . Hypotension    HPI  Grant Stevens is a 80 y.o. male brought to the ED via EMS from local UC. He reports he was in his usual state of health yesterday but began having multiple episodes of dark brown diarrhea during the night. He went to UC this morning and was noted to by hypotensive. They started an IV and called EMS to bring him to the ED. He was given a total of 800cc saline enroute with improvement in BP.He denies any fever, cough, congestion or SOB. No abdominal pain, nausea or vomiting. Denies any melena or hematochezia. Patient reports a 25lb weight loss in the last 2 months despite a good appetite.    Past Medical History:  Diagnosis Date  . Allergic rhinitis   . Arthritis   . Asthma    Childhood  . Atrial fibrillation ([place])    Remote history - WARCEF  . Bladder cancer ([place])   . BPH (benign prostatic hyperplasia)   . Cardiomyopathy ([place])   . CHF (congestive heart failure) ([place])   . Coronary atherosclerosis of native coronary artery    Multivessel s/p CABG in 06/2001 post-IMI, LVEF 35% up to 50% postoperatively;  . Depression   . Difficulty sleeping   . Erectile dysfunction   . Essential hypertension   . Frequency of urination   . History of bipolar disorder   . Hyperlipidemia   . IBS (irritable bowel syndrome)   . Myocardial infarction [facility]) 2002    Past Surgical History:  Procedure Laterality Date  . BIOPSY  03/28/2018   Procedure: BIOPSY;  Surgeon: Rogene Houston, MD;  [facility];  Service: Endoscopy;;  esophagus  . CATARACT EXTRACTION W/PHACO Left 12/29/2014   Procedure: CATARACT EXTRACTION PHACO AND INTRAOCULAR LENS PLACEMENT (IOC);  Surgeon: Williams Che, MD;  Location: [facility] OR[facility];  Service: Ophthalmology;  Laterality: Left;  CDE:3.71  . CATARACT EXTRACTION W/PHACO Right 03/30/2015    Procedure: CATARACT EXTRACTION PHACO AND INTRAOCULAR LENS PLACEMENT RIGHT EYE CDE=2.56;  Surgeon: Williams Che, MD;  Location: [facility];  Service: Ophthalmology;  Laterality: Right;  . CIRCUMCISION  10/2003  . COLONOSCOPY N/A 07/11/2012   Procedure: COLONOSCOPY;  Surgeon: Rogene Houston, MD;  [facility];  Service: Endoscopy;  Laterality: N/A;  830-moved to 930 Ann to notify pt  . CORONARY ARTERY BYPASS GRAFT  08/2000   Dr. Servando Snare - LIMA to LAD, SVG to diagonal, SVG to PDA TRIPLE BYPASS  . CYSTOSCOPY W/ RETROGRADES Bilateral 03/02/2015   Procedure: CYSTOSCOPY WITH RIGHT RETROGRADE PYELOGRAM,  ATTEMPTED LEFT RETROGRADE PYELOGRAM;  Surgeon: Cleon Gustin, MD;  [facility];  Service: Urology;  Laterality: Bilateral;  . ESOPHAGEAL DILATION N/A 03/28/2018   Procedure: ESOPHAGEAL DILATION;  Surgeon: Rogene Houston, MD;  [facility];  Service: Endoscopy;  Laterality: N/A;  . ESOPHAGOGASTRODUODENOSCOPY N/A 03/28/2018   Procedure: ESOPHAGOGASTRODUODENOSCOPY (EGD);  Surgeon: Rogene Houston, MD;  [facility];  Service: Endoscopy;  Laterality: N/A;  120  . TONSILLECTOMY    . TRANSURETHRAL RESECTION OF BLADDER TUMOR N/A 01/26/2015   Procedure: TRANSURETHRAL RESECTION OF BLADDER TUMOR (TURBT);  Surgeon: Cleon Gustin, MD;  [facility];  Service: Urology;  Laterality: N/A;  . TRANSURETHRAL RESECTION OF BLADDER TUMOR WITH GYRUS (TURBT-GYRUS) N/A 03/02/2015   Procedure: TRANSURETHRAL RESECTION  OF BLADDER TUMOR WITH GYRUS (TURBT-GYRUS);  Surgeon: Cleon Gustin, MD;  [facility];  Service: Urology;  Laterality: N/A;  . TRANSURETHRAL RESECTION OF PROSTATE N/A 01/26/2015   Procedure: TRANSURETHRAL RESECTION OF THE PROSTATE WITH GYRUS INSTRUMENTS;  Surgeon: Cleon Gustin, MD;  [facility];  Service: Urology;  Laterality: N/A;    Family History  Problem Relation Age of Onset  . Asthma Mother   . Heart attack Father 42  . Diabetes Brother      Social History   Tobacco Use  . Smoking status: Former Smoker    Packs/day: 1.00    Years: 15.00    Pack years: 15.00    Types: Cigarettes    Quit date: 01/11/1971    Years since quitting: 49.1  . Smokeless tobacco: Never Used  Vaping Use  . Vaping Use: Never used  Substance Use Topics  . Alcohol use: Not Currently    Comment: occ.  . Drug use: No     Home Medications Prior to Admission medications   Medication Sig Start Date End Date Taking? Authorizing Provider  aspirin EC 81 MG tablet Take 1 tablet (81 mg total) by mouth daily. 03/31/18   Rehman, Mechele Dawley, MD  atorvastatin (LIPITOR) 80 MG tablet Take 80 mg by mouth at bedtime.      [provider]  carbamazepine (TEGRETOL) 200 MG tablet Take 400 mg by mouth 2 (two) times daily.     [provider]  divalproex (DEPAKOTE ER) 250 MG 24 hr tablet Take 500 mg by mouth in the morning and at bedtime.  03/12/18   [provider]  famotidine (PEPCID) 40 MG tablet Take 1 tablet PO at bedtime. Due for yearly office visit for refills. 01/31/20   Harvel Quale, MD  furosemide (LASIX) 40 MG tablet Take 40 mg by mouth daily. May take additional tablet for 3 lbs wt gain over night    [provider]  lisinopril (ZESTRIL) 5 MG tablet TAKE (1) TABLET BY MOUTH ONCE DAILY. 07/24/19   [name], MD  LORazepam (ATIVAN) 0.5 MG tablet Take 0.5 mg by mouth at bedtime.    [provider]  Psyllium (METAMUCIL FIBER PO) Take 2 capsules by mouth daily.    [provider]  tamsulosin (FLOMAX) 0.4 MG CAPS capsule Take 0.4 mg by mouth at bedtime.    [provider]  temazepam (RESTORIL) 15 MG capsule Take 15 mg by mouth at bedtime as needed for sleep.    [provider]     Allergies    Patient has no known allergies.   Review of Systems   Review of Systems A comprehensive review of systems was completed and negative except as noted in HPI.    Physical  Exam BP 111/64   Pulse 73   Temp 98 F (36.7 C) (Oral)   Resp 18   Ht 6' (1.829 m)   Wt 90.7 kg   SpO2 100%   BMI 27.12 kg/m   Physical Exam Vitals and nursing note reviewed.  Constitutional:      Appearance: Normal appearance.  HENT:     Head: Normocephalic and atraumatic.     Nose: Nose normal.     Mouth/Throat:     Mouth: Mucous membranes are moist.  Eyes:     Extraocular Movements: Extraocular movements intact.     Conjunctiva/sclera: Conjunctivae normal.  Cardiovascular:     Rate and Rhythm: Normal rate.  Pulmonary:  Effort: Pulmonary effort is normal.     Breath sounds: Normal breath sounds.  Abdominal:     General: Abdomen is flat.     Palpations: Abdomen is soft.     Tenderness: There is no abdominal tenderness.  Musculoskeletal:        General: No swelling. Normal range of motion.     Cervical back: Neck supple.  Skin:    General: Skin is warm and dry.  Neurological:     General: No focal deficit present.     Mental Status: He is alert.  Psychiatric:        Mood and Affect: Mood normal.      ED Results / Procedures / Treatments   Labs (all labs ordered are listed, but only abnormal results are displayed) Labs Reviewed  COMPREHENSIVE METABOLIC PANEL  CBC WITH DIFFERENTIAL/PLATELET  URINALYSIS, ROUTINE W REFLEX MICROSCOPIC    EKG None  Radiology No results found.  Procedures Procedures  Medications Ordered in the ED Medications - No data to display   MDM Rules/Calculators/A&P MDM Benign abdomen today but multiple low BP readings at UC now improved. Given unexplained weight loss, will check labs and CT to evaluate cause of his symptoms.   ED Course  I have reviewed the triage vital signs and the nursing notes.  Pertinent labs & imaging results that were available during my care of the patient were reviewed by me and considered in my medical decision making (see chart for details).  Clinical Course as of 02/11/20 1640  Tue Feb 11, 2020  1036 CMP with mild increase in Cr from baseline. Na similar to previous.  [CS]  [id number] CBC with mild leukocytosis.  [CS]  [id number] Covid Ag is neg. Low suspicion for Covid infection given no fever or respiratory symptoms and fully vaccinated/boosted.  [CS]  1430 Patient reports he is feeling better, but has still not been able to urinate. He wants to try walking to the bathroom to see how he feels.  [CS]  [id number] Patient still unable to urinate, will give additional IVF.  [CS]  [id number] Care of the patient signed out to Dr. Rogene Houston at the change of shift.  [CS]    Clinical Course User Index [CS] Truddie Hidden, MD    Final Clinical Impression(s) / ED Diagnoses Final diagnoses:  None    Rx / DC Orders ED Discharge Orders    None       Truddie Hidden, MD 02/11/20 [phone]

## 2020-02-11 NOTE — ED Notes (Signed)
Pt transported to CT ?

## 2020-02-11 NOTE — ED Triage Notes (Signed)
C/o dizzy and diarrhea since last night.

## 2020-02-11 NOTE — ED Provider Notes (Signed)
[facility]    CSN: [id number] Arrival date & time: 02/11/20  0844      History   Chief Complaint No chief complaint on file.   HPI Grant Stevens is a 80 y.o. male.   Reports that he is not feeling well.  Reports he has been feeling dizzy, lightheaded.  Reports that he has been having excessive diarrhea.  Reports that he was told that it may be one of his medications.  Patient has history of A. fib, hypertension, coronary atherosclerosis of the native coronary artery, bipolar, bladder cancer, previous MI, CHF.  Reports that he felt fine yesterday.  Reports that he had uncontrolled diarrhea through the night and now this morning he feels terribly weak.  States that he did drive himself this morning.  Denies alleviating factors.  Reports that symptoms are worsened with activity.  Denies headache, nausea, vomiting, fever, rash, other symptoms.  ROS per HPI   The history is provided by the patient.    Past Medical History:  Diagnosis Date  . Allergic rhinitis   . Arthritis   . Asthma    Childhood  . Atrial fibrillation ([place])    Remote history - WARCEF  . Bladder cancer ([place])   . BPH (benign prostatic hyperplasia)   . Cardiomyopathy ([place])   . CHF (congestive heart failure) ([place])   . Coronary atherosclerosis of native coronary artery    Multivessel s/p CABG in 06/2001 post-IMI, LVEF 35% up to 50% postoperatively;  . Depression   . Difficulty sleeping   . Erectile dysfunction   . Essential hypertension   . Frequency of urination   . History of bipolar disorder   . Hyperlipidemia   . IBS (irritable bowel syndrome)   . Myocardial infarction [facility]) 2002    Patient Active Problem List   Diagnosis Date Noted  . Food impaction of esophagus 03/20/2018  . BPH (benign prostatic hyperplasia) 01/26/2015  . Dizziness 10/19/2014  . Coronary atherosclerosis of native coronary artery   . History of atrial fibrillation   . Essential hypertension, benign 09/22/2010  .  Mixed hyperlipidemia 11/28/2008  . SLEEP APNEA 11/28/2008    Past Surgical History:  Procedure Laterality Date  . BIOPSY  03/28/2018   Procedure: BIOPSY;  Surgeon: Rogene Houston, MD;  [facility];  Service: Endoscopy;;  esophagus  . CATARACT EXTRACTION W/PHACO Left 12/29/2014   Procedure: CATARACT EXTRACTION PHACO AND INTRAOCULAR LENS PLACEMENT (IOC);  Surgeon: Williams Che, MD;  Location: [facility];  Service: Ophthalmology;  Laterality: Left;  CDE:3.71  . CATARACT EXTRACTION W/PHACO Right 03/30/2015   Procedure: CATARACT EXTRACTION PHACO AND INTRAOCULAR LENS PLACEMENT RIGHT EYE CDE=2.56;  Surgeon: Williams Che, MD;  Location: [facility];  Service: Ophthalmology;  Laterality: Right;  . CIRCUMCISION  10/2003  . COLONOSCOPY N/A 07/11/2012   Procedure: COLONOSCOPY;  Surgeon: Rogene Houston, MD;  [facility];  Service: Endoscopy;  Laterality: N/A;  830-moved to 930 Ann to notify pt  . CORONARY ARTERY BYPASS GRAFT  08/2000   Dr. Servando Snare - LIMA to LAD, SVG to diagonal, SVG to PDA TRIPLE BYPASS  . CYSTOSCOPY W/ RETROGRADES Bilateral 03/02/2015   Procedure: CYSTOSCOPY WITH RIGHT RETROGRADE PYELOGRAM,  ATTEMPTED LEFT RETROGRADE PYELOGRAM;  Surgeon: Cleon Gustin, MD;  [facility];  Service: Urology;  Laterality: Bilateral;  . ESOPHAGEAL DILATION N/A 03/28/2018   Procedure: ESOPHAGEAL DILATION;  Surgeon: Rogene Houston, MD;  [facility];  Service: Endoscopy;  Laterality: N/A;  . ESOPHAGOGASTRODUODENOSCOPY N/A 03/28/2018   Procedure: ESOPHAGOGASTRODUODENOSCOPY (EGD);  Surgeon: Rogene Houston, MD;  [facility];  Service: Endoscopy;  Laterality: N/A;  120  . TONSILLECTOMY    . TRANSURETHRAL RESECTION OF BLADDER TUMOR N/A 01/26/2015   Procedure: TRANSURETHRAL RESECTION OF BLADDER TUMOR (TURBT);  Surgeon: Cleon Gustin, MD;  [facility];  Service: Urology;  Laterality: N/A;  . TRANSURETHRAL RESECTION OF BLADDER TUMOR WITH GYRUS  (TURBT-GYRUS) N/A 03/02/2015   Procedure: TRANSURETHRAL RESECTION OF BLADDER TUMOR WITH GYRUS (TURBT-GYRUS);  Surgeon: Cleon Gustin, MD;  [facility];  Service: Urology;  Laterality: N/A;  . TRANSURETHRAL RESECTION OF PROSTATE N/A 01/26/2015   Procedure: TRANSURETHRAL RESECTION OF THE PROSTATE WITH GYRUS INSTRUMENTS;  Surgeon: Cleon Gustin, MD;  [facility];  Service: Urology;  Laterality: N/A;       Home Medications    Prior to Admission medications   Medication Sig Start Date End Date Taking? Authorizing Provider  aspirin EC 81 MG tablet Take 1 tablet (81 mg total) by mouth daily. 03/31/18   Rehman, Mechele Dawley, MD  atorvastatin (LIPITOR) 80 MG tablet Take 80 mg by mouth at bedtime.      [provider]  carbamazepine (TEGRETOL) 200 MG tablet Take 400 mg by mouth 2 (two) times daily.     [provider]  divalproex (DEPAKOTE ER) 250 MG 24 hr tablet Take 500 mg by mouth in the morning and at bedtime.  03/12/18   [provider]  famotidine (PEPCID) 40 MG tablet Take 1 tablet PO at bedtime. Due for yearly office visit for refills. 01/31/20   Harvel Quale, MD  furosemide (LASIX) 40 MG tablet Take 40 mg by mouth daily. May take additional tablet for 3 lbs wt gain over night    [provider]  lisinopril (ZESTRIL) 5 MG tablet TAKE (1) TABLET BY MOUTH ONCE DAILY. 07/24/19   [name], MD  LORazepam (ATIVAN) 0.5 MG tablet Take 0.5 mg by mouth at bedtime.    [provider]  Psyllium (METAMUCIL FIBER PO) Take 2 capsules by mouth daily.    [provider]  tamsulosin (FLOMAX) 0.4 MG CAPS capsule Take 0.4 mg by mouth at bedtime.    [provider]  temazepam (RESTORIL) 15 MG capsule Take 15 mg by mouth at bedtime as needed for sleep.    [provider]    Family History Family History  Problem Relation Age of Onset  . Asthma Mother   . Heart attack Father 25  . Diabetes Brother      Social History Social History   Tobacco Use  . Smoking status: Former Smoker    Packs/day: 1.00    Years: 15.00    Pack years: 15.00    Types: Cigarettes    Quit date: 01/11/1971    Years since quitting: 49.1  . Smokeless tobacco: Never Used  Vaping Use  . Vaping Use: Never used  Substance Use Topics  . Alcohol use: Not Currently    Comment: occ.  . Drug use: No     Allergies   Patient has no known allergies.   Review of Systems Review of Systems   Physical Exam Triage Vital Signs ED Triage Vitals  Enc Vitals Group     BP 02/11/20 0851 (!) 74/51     Pulse Rate 02/11/20 0851 81     Resp 02/11/20 0851 16     Temp 02/11/20 0851  98.1 F (36.7 C)     Temp Source 02/11/20 0851 Oral     SpO2 02/11/20 0851 94 %     Weight --      Height --      Head Circumference --      Peak Flow --      Pain Score 02/11/20 0909 0     Pain Loc --      Pain Edu? --      Excl. in [place]? --    No data found.  Updated Vital Signs BP (!) 81/51 (BP Location: Right Arm)   Pulse 68   Temp 98.1 F (36.7 C) (Oral)   Resp 18   SpO2 96%   Visual Acuity Right Eye Distance:   Left Eye Distance:   Bilateral Distance:    Right Eye Near:   Left Eye Near:    Bilateral Near:     Physical Exam Vitals and nursing note reviewed.  Constitutional:      General: He is in acute distress.     Appearance: He is well-developed and well-nourished. He is ill-appearing.  HENT:     Head: Normocephalic and atraumatic.  Eyes:     Conjunctiva/sclera: Conjunctivae normal.  Cardiovascular:     Rate and Rhythm: Normal rate and regular rhythm.     Heart sounds: No murmur heard.     Comments: Hypotensive, diaphoretic and pale Pulmonary:     Effort: Pulmonary effort is normal. No respiratory distress.     Breath sounds: Normal breath sounds.  Abdominal:     Palpations: Abdomen is soft.     Tenderness: There is no abdominal tenderness.  Musculoskeletal:        General: No edema.     Cervical  back: Normal range of motion and neck supple.  Skin:    General: Skin is warm and dry.     Capillary Refill: Capillary refill takes less than 2 seconds.  Neurological:     Mental Status: He is alert.  Psychiatric:        Mood and Affect: Mood and affect normal.      UC Treatments / Results  Labs (all labs ordered are listed, but only abnormal results are displayed) Labs Reviewed - No data to display  EKG   Radiology  Procedures Procedures (including critical care time)  Medications Ordered in UC Medications  sodium chloride 0.9 % bolus 1,000 mL (1,000 mLs Intravenous New Bag/Given 02/11/20 0911)    Initial Impression / Assessment and Plan / UC Course  I have reviewed the triage vital signs and the nursing notes.  Pertinent labs & imaging results that were available during my care of the patient were reviewed by me and considered in my medical decision making (see chart for details).    Weakness Dizziness Hypotension Dehydration  Presents with weakness, dizziness, hypotensive on exam Initial blood pressure was 78/40 Patient initially pale, diaphoretic, stating he feels like he is going to pass out Peripheral IV started and normal saline bolus Discussed with patient that he needs to be further evaluated in the ER Verbalized understanding is in agreement with treatment plan Patient to ER via EMS with saline bolus running  Final Clinical Impressions(s) / UC Diagnoses   Final diagnoses:  Hypotension, unspecified hypotension type  Dehydration  Weakness  Dizziness and giddiness     Discharge Instructions     To ER via EMS    ED Prescriptions    None     PDMP  not reviewed this encounter.   Faustino Congress, NP 02/11/20 1545

## 2020-02-11 NOTE — Discharge Instructions (Signed)
To ER via EMS 

## 2020-02-11 NOTE — ED Provider Notes (Signed)
Patient's blood pressures have improved significantly and have been steady now for several hours.  He is ambulated without any lightheadedness or feeling as if he is got a passout.  Urinalysis was negative other than his urine was very hemoconcentrated.  I think the patient was having significant dehydration which resulted in the hypotension when he first arrived secondary to the diarrhea.  CT scan showed no acute abdominal findings.  Patient feeling much better overall we will send him home with some Imodium A-D.  His only had one loose bowel movement here.  Patient will follow up with his doctors.  Also his antigen Covid testing was negative so did send formal PCR test that he can get results on tomorrow   Fredia Sorrow, MD 02/11/20 1900

## 2020-02-11 NOTE — ED Notes (Signed)
EMS here to transport Pt to ED

## 2020-02-11 NOTE — Discharge Instructions (Signed)
Formal COVID testing done right prior to discharge.  Should have results by tomorrow.  Covid can cause diarrhea.  Hydrate yourself well.  Take the Imodium A-D to help slow the diarrhea down.  Return for any new or worse symptoms.  Make an appointment to follow-up with your doctor for recheck in a couple days.

## 2020-02-11 NOTE — ED Notes (Signed)
LATE ENTRY for 1815  This RN assisted pt to restroom to attempt to void and provide a urine sample. Pt then rang for staff assistance, found patient to be sitting on toilet and to have had an large, watery bowel movement. Pt cleaned and changed into mesh underwear, blue paper scrub pants and clean socks. Pt ambulated back to bed with a standby assistance from staff. Bed is locked in the lowest position, side rails x2, call bell within reach.

## 2020-02-11 NOTE — ED Triage Notes (Signed)
Pt brought in by [facility] from Urgent Care with c/o dizziness and diarrhea. Pt reports the diarrhea isn't something new but that it started back last night. EMS was called due to pt's SBP 68. EMS arrived and pt's SBP of 100. CBG 119. Pt was given 845ml of NS by EMS.

## 2020-02-12 ENCOUNTER — Ambulatory Visit: Payer: PPO | Admitting: Orthopedic Surgery

## 2020-02-13 ENCOUNTER — Ambulatory Visit: Payer: PPO | Admitting: Orthopedic Surgery

## 2020-02-17 DIAGNOSIS — R197 Diarrhea, unspecified: Secondary | ICD-10-CM | POA: Diagnosis not present

## 2020-02-18 DIAGNOSIS — K591 Functional diarrhea: Secondary | ICD-10-CM | POA: Diagnosis not present

## 2020-02-20 ENCOUNTER — Encounter: Payer: Self-pay | Admitting: Orthopedic Surgery

## 2020-02-20 ENCOUNTER — Ambulatory Visit (INDEPENDENT_AMBULATORY_CARE_PROVIDER_SITE_OTHER): Payer: PPO

## 2020-02-20 ENCOUNTER — Other Ambulatory Visit: Payer: Self-pay

## 2020-02-20 ENCOUNTER — Ambulatory Visit (INDEPENDENT_AMBULATORY_CARE_PROVIDER_SITE_OTHER): Payer: PPO | Admitting: Orthopedic Surgery

## 2020-02-20 VITALS — BP 136/76 | HR 77 | Ht 72.0 in | Wt 196.0 lb

## 2020-02-20 DIAGNOSIS — M1711 Unilateral primary osteoarthritis, right knee: Secondary | ICD-10-CM | POA: Diagnosis not present

## 2020-02-20 DIAGNOSIS — M1712 Unilateral primary osteoarthritis, left knee: Secondary | ICD-10-CM | POA: Diagnosis not present

## 2020-02-20 NOTE — Patient Instructions (Signed)
Brace for activity   Let us know if pain or function deteriorate

## 2020-02-20 NOTE — Progress Notes (Signed)
Chief Complaint  Patient presents with  . Knee Pain    Rt knee    BP 136/76   Pulse 77   Ht 6' (1.829 m)   Wt 196 lb (88.9 kg)   BMI 26.58 kg/m   Encounter Diagnosis  Name Primary?  . Primary osteoarthritis of right knee Yes    80 year old male comes to [place] for 1 year follow-up he has osteoarthritis of his right knee primarily medial compartment he says his pain is well controlled although he needs a new brace  He is also reporting good function  Reminder he has a history of severe coronary artery disease and surgery should be a last resort  His exam is noted below  Physical Exam Constitutional:      General: He is not in acute distress.    Appearance: He is well-developed.     Comments: Well developed, well nourished Normal grooming and hygiene     Cardiovascular:     Comments: No peripheral edema Musculoskeletal:     Comments: Right knee still has good function good strength well balanced with stable ligaments and good range of motion  Skin:    General: Skin is warm and dry.  Neurological:     Mental Status: He is alert and oriented to person, place, and time.     Sensory: No sensory deficit.     Coordination: Coordination normal.     Gait: Gait normal.     Deep Tendon Reflexes: Reflexes are normal and symmetric.  Psychiatric:        Mood and Affect: Mood normal.        Behavior: Behavior normal.        Thought Content: Thought content normal.        Judgment: Judgment normal.     Comments: Affect normal      xrays medial oa w osteophytes  Patient will wear unloader brace continue to control his pain with primarily Tylenol  Follow-up as needed

## 2020-03-30 DIAGNOSIS — F3110 Bipolar disorder, current episode manic without psychotic features, unspecified: Secondary | ICD-10-CM | POA: Diagnosis not present

## 2020-03-31 DIAGNOSIS — Z79899 Other long term (current) drug therapy: Secondary | ICD-10-CM | POA: Diagnosis not present

## 2020-04-01 ENCOUNTER — Other Ambulatory Visit: Payer: Self-pay | Admitting: Urology

## 2020-05-01 DIAGNOSIS — I5022 Chronic systolic (congestive) heart failure: Secondary | ICD-10-CM | POA: Diagnosis not present

## 2020-05-01 DIAGNOSIS — N183 Chronic kidney disease, stage 3 unspecified: Secondary | ICD-10-CM | POA: Diagnosis not present

## 2020-05-08 DIAGNOSIS — I5022 Chronic systolic (congestive) heart failure: Secondary | ICD-10-CM | POA: Diagnosis not present

## 2020-05-08 DIAGNOSIS — E871 Hypo-osmolality and hyponatremia: Secondary | ICD-10-CM | POA: Diagnosis not present

## 2020-05-08 DIAGNOSIS — F319 Bipolar disorder, unspecified: Secondary | ICD-10-CM | POA: Diagnosis not present

## 2020-05-11 ENCOUNTER — Ambulatory Visit (INDEPENDENT_AMBULATORY_CARE_PROVIDER_SITE_OTHER): Payer: PPO | Admitting: Gastroenterology

## 2020-05-11 ENCOUNTER — Encounter (INDEPENDENT_AMBULATORY_CARE_PROVIDER_SITE_OTHER): Payer: Self-pay | Admitting: Gastroenterology

## 2020-05-11 ENCOUNTER — Other Ambulatory Visit: Payer: Self-pay

## 2020-05-11 DIAGNOSIS — A09 Infectious gastroenteritis and colitis, unspecified: Secondary | ICD-10-CM | POA: Diagnosis not present

## 2020-05-11 NOTE — Patient Instructions (Signed)
Continue taking Metamucil daily

## 2020-05-11 NOTE — Progress Notes (Signed)
Grant Stevens, M.D. Gastroenterology & Hepatology [facility] [address] [place], [place] [address]  Primary Care Physician: [name], MD [address] [place] [address]  I will communicate my assessment and recommendations to the referring MD via EMR.  Problems: 1. Acute diarrhea, likely viral gastroenteritis 2. Barrett's esophagus - short segment 3. History of food impaction due to Schatzki's ring vs dysmotility 4. IBS  History of Present Illness: Grant Stevens is a 80 y.o. male with PMH localized bladder cancer s/p BCG injection, MI s/p CABG, asthma, BPH, cardiomyopathy, heart failure, hypertension, bipolar disorder, hyperlipidemia, IBS, who presents for follow up of acute diarrhea.  The patient was last seen on 03/28/2018. At that time, the patient underwent an EGD for evaluation of dysphagia.  This procedure was performed on 03/28/2018, patient was found to have abnormal motility in the mid esophagus to distal esophagus, with presence of mild Schatzki ring at the GE junction which was a patch of salmon-colored mucosa in the distal esophagus which was consistent with Barrett's esophagus per biopsy report.  A 2 cm hiatal hernia was present.  Normal stomach and presence of duodenal erosions were found.  H. pylori serology was negative..  The patient came to the ER and the hospital on 02/11/2020 after presenting with several episodes of diarrhea which were watery to loose in nature.  He initially went to an urgent care and was found to have hypotension and he was transferred to the ER for further evaluation.  He received IV fluids as his pressure was in the low side with adequate response afterwards. Labs at that time showed although cell count of 12,000 with normal hemoglobin of 13.3 and platelets were low 119, CMP showed normal liver enzymes, normal renal function with creatinine 1.34 and BUN 27, sodium was low 132 with  rest of electrolytes within normal limits.  Urinalysis was negative.  A CT of the abdomen and pelvis with IV contrast showed changes consistent with diarrheal illness in the rectal area but no other acute alterations.  Patient was discharged on Lomotil. He reports that he did not tolerate Lomotil and only took it for two days, he reports it did not help him feel well and the diarrhea eventually resolved on its own.  Patient reports that he had a stool testing in his PCP office a few days later and he was told the infection was a viral episode. He has been feeling well since then. He reports that once every 3 weeks he has seen some scant stool staining in his underwear, but no large amount of stool.  He has recently started taking Metamucil which he believes has helped resolve the fecal smearing.  The patient denies having any nausea, vomiting, fever, chills, hematochezia, melena, hematemesis, abdominal distention, abdominal pain, diarrhea, jaundice, pruritus or weight loss.  Past Medical History: Past Medical History:  Diagnosis Date  . Allergic rhinitis   . Arthritis   . Asthma    Childhood  . Atrial fibrillation ([place])    Remote history - WARCEF  . Bladder cancer ([place])   . BPH (benign prostatic hyperplasia)   . Cardiomyopathy ([place])   . CHF (congestive heart failure) ([place])   . Coronary atherosclerosis of native coronary artery    Multivessel s/p CABG in 06/2001 post-IMI, LVEF 35% up to 50% postoperatively;  . Depression   . Difficulty sleeping   . Erectile dysfunction   . Essential hypertension   . Frequency of urination   .  History of bipolar disorder   . Hyperlipidemia   . IBS (irritable bowel syndrome)   . Myocardial infarction [facility]) 2002    Past Surgical History: Past Surgical History:  Procedure Laterality Date  . BIOPSY  03/28/2018   Procedure: BIOPSY;  Surgeon: Rogene Houston, MD;  Location: [facility] ENDO SU[facility];  Service: Endoscopy;;  esophagus  . CATARACT EXTRACTION W/PHACO  Left 12/29/2014   Procedure: CATARACT EXTRACTION PHACO AND INTRAOCULAR LENS PLACEMENT (IOC);  Surgeon: Williams Che, MD;  Location: [facility] ORS;  Service: Ophthalmology;  Laterality: Left;  CDE:3.71  . CATARACT EXTRACTION W/PHACO Right 03/30/2015   Procedure: CATARACT EXTRACTION PHACO AND INTRAOCULAR LENS PLACEMENT RIGHT EYE CDE=2.56;  Surgeon: Williams Che, MD;  Location: [facility] ORS;  Service: Ophthalmology;  Laterality: Right;  . CIRCUMCISION  10/2003  . COLONOSCOPY N/A 07/11/2012   Procedure: COLONOSCOPY;  Surgeon: Rogene Houston, MD;  Location: [facility] ENDO SUITE;  Service: Endoscopy;  Laterality: N/A;  830-moved to 930 Ann to notify pt  . CORONARY ARTERY BYPASS GRAFT  08/2000   Dr. Servando Snare - LIMA to LAD, SVG to diagonal, SVG to PDA TRIPLE BYPASS  . CYSTOSCOPY W/ RETROGRADES Bilateral 03/02/2015   Procedure: CYSTOSCOPY WITH RIGHT RETROGRADE PYELOGRAM,  ATTEMPTED LEFT RETROGRADE PYELOGRAM;  Surgeon: Cleon Gustin, MD;  Location: [facility] ORS;  Service: Urology;  Laterality: Bilateral;  . ESOPHAGEAL DILATION N/A 03/28/2018   Procedure: ESOPHAGEAL DILATION;  Surgeon: Rogene Houston, MD;  Location: [facility];  Service: Endoscopy;  Laterality: N/A;  . ESOPHAGOGASTRODUODENOSCOPY N/A 03/28/2018   Procedure: ESOPHAGOGASTRODUODENOSCOPY (EGD);  Surgeon: Rogene Houston, MD;  [facility];  Service: Endoscopy;  Laterality: N/A;  120  . TONSILLECTOMY    . TRANSURETHRAL RESECTION OF BLADDER TUMOR N/A 01/26/2015   Procedure: TRANSURETHRAL RESECTION OF BLADDER TUMOR (TURBT);  Surgeon: Cleon Gustin, MD;  Location: [facility] ORS;  Service: Urology;  Laterality: N/A;  . TRANSURETHRAL RESECTION OF BLADDER TUMOR WITH GYRUS (TURBT-GYRUS) N/A 03/02/2015   Procedure: TRANSURETHRAL RESECTION OF BLADDER TUMOR WITH GYRUS (TURBT-GYRUS);  Surgeon: Cleon Gustin, MD;  Location: [facility] ORS;  Service: Urology;  Laterality: N/A;  . TRANSURETHRAL RESECTION OF PROSTATE N/A 01/26/2015   Procedure: TRANSURETHRAL RESECTION  OF THE PROSTATE WITH GYRUS INSTRUMENTS;  Surgeon: Cleon Gustin, MD;  Location: [facility] ORS;  Service: Urology;  Laterality: N/A;    Family History: Family History  Problem Relation Age of Onset  . Asthma Mother   . Heart attack Father 24  . Diabetes Brother     Social History: Social History   Tobacco Use  Smoking Status Former Smoker  . Packs/day: 1.00  . Years: 15.00  . Pack years: 15.00  . Types: Cigarettes  . Quit date: 01/11/1971  . Years since quitting: 49.3  Smokeless Tobacco Never Used   Social History   Substance and Sexual Activity  Alcohol Use Not Currently   Comment: occ.   Social History   Substance and Sexual Activity  Drug Use No    Allergies: No Known Allergies  Medications: Current Outpatient Medications  Medication Sig Dispense Refill  . aspirin EC 81 MG tablet Take 1 tablet (81 mg total) by mouth daily.    Marland Kitchen atorvastatin (LIPITOR) 80 MG tablet Take 80 mg by mouth at bedtime.      . carbamazepine (TEGRETOL) 200 MG tablet Take 400 mg by mouth 2 (two) times daily.     . divalproex (DEPAKOTE ER) 250 MG 24 hr tablet Take 500 mg by  mouth in the morning and at bedtime.     . famotidine (PEPCID) 40 MG tablet Take 1 tablet PO at bedtime. Due for yearly office visit for refills. 30 tablet 0  . furosemide (LASIX) 40 MG tablet Take 40 mg by mouth daily. May take additional tablet for 3 lbs wt gain over night    . lisinopril (ZESTRIL) 5 MG tablet TAKE (1) TABLET BY MOUTH ONCE DAILY. (Patient taking differently: Take 5 mg by mouth daily.) 90 tablet 3  . LORazepam (ATIVAN) 0.5 MG tablet Take 0.5 mg by mouth at bedtime.    . psyllium (METAMUCIL) 58.6 % packet Take 1 packet by mouth 2 (two) times daily.    . tamsulosin (FLOMAX) 0.4 MG CAPS capsule TAKE (1) CAPSULE BY MOUTH AT BEDTIME. 90 capsule 0   No current facility-administered medications for this visit.    Review of Systems: GENERAL: negative for malaise, night sweats HEENT: No changes in hearing or  vision, no nose bleeds or other nasal problems. NECK: Negative for lumps, goiter, pain and significant neck swelling RESPIRATORY: Negative for cough, wheezing CARDIOVASCULAR: Negative for chest pain, leg swelling, palpitations, orthopnea GI: SEE HPI MUSCULOSKELETAL: Negative for joint pain or swelling, back pain, and muscle pain. SKIN: Negative for lesions, rash PSYCH: Negative for sleep disturbance, mood disorder and recent psychosocial stressors. HEMATOLOGY Negative for prolonged bleeding, bruising easily, and swollen nodes. ENDOCRINE: Negative for cold or heat intolerance, polyuria, polydipsia and goiter. NEURO: negative for tremor, gait imbalance, syncope and seizures. The remainder of the review of systems is noncontributory.   Physical Exam: BP (!) 147/79 (BP Location: Right Arm, Patient Position: Sitting, Cuff Size: Large)   Pulse 69   Temp 98.5 F (36.9 C) (Oral)   Ht 6' (1.829 m)   Wt 197 lb (89.4 kg)   BMI 26.72 kg/m  GENERAL: The patient is AO x3, in no acute distress. HEENT: Head is normocephalic and atraumatic. EOMI are intact. Mouth is well hydrated and without lesions. NECK: Supple. No masses LUNGS: Clear to auscultation. No presence of rhonchi/wheezing/rales. Adequate chest expansion HEART: RRR, normal s1 and s2. ABDOMEN: Soft, nontender, no guarding, no peritoneal signs, and nondistended. BS +. No masses. EXTREMITIES: Without any cyanosis, clubbing, rash, lesions or edema. NEUROLOGIC: AOx3, no focal motor deficit. SKIN: no jaundice, no rashes  Imaging/Labs: as above  I personally reviewed and interpreted the available labs, imaging and endoscopic files.  Impression and Plan: Grant Stevens is a 80 y.o. male with PMH localized bladder cancer s/p BCG injection, MI s/p CABG, asthma, BPH, cardiomyopathy, heart failure, hypertension, bipolar disorder, hyperlipidemia, IBS, who presents for follow up of acute diarrhea.  Patient has presented significant improvement  of his symptoms after his initial presentation, it is very likely his symptoms were related to an acute viral gastroenteritis that has resolved on its own.  I do not consider he requires any further treatment with any other medications.  He has responded to the use of Metamucil that has increased the bulk of his stool.  Patient understood and agreed.  - Continue taking Metamucil daily  All questions were answered.      Harvel Quale, MD Gastroenterology and Hepatology [facility]

## 2020-05-12 ENCOUNTER — Inpatient Hospital Stay (HOSPITAL_COMMUNITY)
Admission: EM | Admit: 2020-05-12 | Discharge: 2020-05-13 | DRG: 243 | Disposition: A | Discharge status: Home / Self Care | Payer: PPO | Attending: Cardiology | Admitting: Cardiology

## 2020-05-12 ENCOUNTER — Other Ambulatory Visit: Payer: Self-pay

## 2020-05-12 ENCOUNTER — Encounter (HOSPITAL_COMMUNITY)
Admission: EM | Disposition: A | Discharge status: Home / Self Care | Payer: Self-pay | Source: Home / Self Care | Attending: Cardiology

## 2020-05-12 ENCOUNTER — Encounter (HOSPITAL_COMMUNITY): Payer: Self-pay | Admitting: Emergency Medicine

## 2020-05-12 ENCOUNTER — Ambulatory Visit (HOSPITAL_COMMUNITY): Admit: 2020-05-12 | Payer: PPO | Admitting: Cardiology

## 2020-05-12 ENCOUNTER — Inpatient Hospital Stay (HOSPITAL_COMMUNITY)
Admission: EM | Disposition: A | Discharge status: Home / Self Care | Payer: Self-pay | Source: Home / Self Care | Attending: Cardiology

## 2020-05-12 ENCOUNTER — Inpatient Hospital Stay (HOSPITAL_COMMUNITY): Payer: PPO

## 2020-05-12 DIAGNOSIS — E782 Mixed hyperlipidemia: Secondary | ICD-10-CM | POA: Diagnosis present

## 2020-05-12 DIAGNOSIS — I5042 Chronic combined systolic (congestive) and diastolic (congestive) heart failure: Secondary | ICD-10-CM | POA: Diagnosis present

## 2020-05-12 DIAGNOSIS — I110 Hypertensive heart disease with heart failure: Secondary | ICD-10-CM | POA: Diagnosis present

## 2020-05-12 DIAGNOSIS — Z79899 Other long term (current) drug therapy: Secondary | ICD-10-CM

## 2020-05-12 DIAGNOSIS — Z87891 Personal history of nicotine dependence: Secondary | ICD-10-CM

## 2020-05-12 DIAGNOSIS — Z20822 Contact with and (suspected) exposure to covid-19: Secondary | ICD-10-CM | POA: Diagnosis present

## 2020-05-12 DIAGNOSIS — I252 Old myocardial infarction: Secondary | ICD-10-CM | POA: Diagnosis not present

## 2020-05-12 DIAGNOSIS — M1990 Unspecified osteoarthritis, unspecified site: Secondary | ICD-10-CM | POA: Diagnosis present

## 2020-05-12 DIAGNOSIS — I2510 Atherosclerotic heart disease of native coronary artery without angina pectoris: Secondary | ICD-10-CM | POA: Diagnosis not present

## 2020-05-12 DIAGNOSIS — N400 Enlarged prostate without lower urinary tract symptoms: Secondary | ICD-10-CM | POA: Diagnosis not present

## 2020-05-12 DIAGNOSIS — Z951 Presence of aortocoronary bypass graft: Secondary | ICD-10-CM

## 2020-05-12 DIAGNOSIS — Z959 Presence of cardiac and vascular implant and graft, unspecified: Secondary | ICD-10-CM

## 2020-05-12 DIAGNOSIS — E785 Hyperlipidemia, unspecified: Secondary | ICD-10-CM | POA: Diagnosis present

## 2020-05-12 DIAGNOSIS — I10 Essential (primary) hypertension: Secondary | ICD-10-CM | POA: Diagnosis present

## 2020-05-12 DIAGNOSIS — G4730 Sleep apnea, unspecified: Secondary | ICD-10-CM | POA: Diagnosis present

## 2020-05-12 DIAGNOSIS — Z833 Family history of diabetes mellitus: Secondary | ICD-10-CM | POA: Diagnosis not present

## 2020-05-12 DIAGNOSIS — I25810 Atherosclerosis of coronary artery bypass graft(s) without angina pectoris: Secondary | ICD-10-CM | POA: Diagnosis present

## 2020-05-12 DIAGNOSIS — I4891 Unspecified atrial fibrillation: Secondary | ICD-10-CM | POA: Diagnosis present

## 2020-05-12 DIAGNOSIS — I213 ST elevation (STEMI) myocardial infarction of unspecified site: Secondary | ICD-10-CM | POA: Diagnosis not present

## 2020-05-12 DIAGNOSIS — Z8551 Personal history of malignant neoplasm of bladder: Secondary | ICD-10-CM

## 2020-05-12 DIAGNOSIS — I442 Atrioventricular block, complete: Secondary | ICD-10-CM | POA: Diagnosis not present

## 2020-05-12 DIAGNOSIS — R001 Bradycardia, unspecified: Secondary | ICD-10-CM | POA: Diagnosis not present

## 2020-05-12 DIAGNOSIS — Z8249 Family history of ischemic heart disease and other diseases of the circulatory system: Secondary | ICD-10-CM

## 2020-05-12 DIAGNOSIS — I255 Ischemic cardiomyopathy: Secondary | ICD-10-CM | POA: Diagnosis present

## 2020-05-12 DIAGNOSIS — R55 Syncope and collapse: Secondary | ICD-10-CM | POA: Diagnosis not present

## 2020-05-12 DIAGNOSIS — R4020 Unspecified coma: Secondary | ICD-10-CM | POA: Diagnosis not present

## 2020-05-12 DIAGNOSIS — Z825 Family history of asthma and other chronic lower respiratory diseases: Secondary | ICD-10-CM | POA: Diagnosis not present

## 2020-05-12 DIAGNOSIS — R0902 Hypoxemia: Secondary | ICD-10-CM | POA: Diagnosis not present

## 2020-05-12 DIAGNOSIS — R404 Transient alteration of awareness: Secondary | ICD-10-CM | POA: Diagnosis not present

## 2020-05-12 DIAGNOSIS — Z950 Presence of cardiac pacemaker: Secondary | ICD-10-CM | POA: Diagnosis not present

## 2020-05-12 DIAGNOSIS — R0689 Other abnormalities of breathing: Secondary | ICD-10-CM | POA: Diagnosis not present

## 2020-05-12 DIAGNOSIS — Z7982 Long term (current) use of aspirin: Secondary | ICD-10-CM | POA: Diagnosis not present

## 2020-05-12 HISTORY — PX: BIV PACEMAKER INSERTION CRT-P: EP1199

## 2020-05-12 HISTORY — PX: TEMPORARY PACEMAKER: CATH118268

## 2020-05-12 LAB — CBC
HCT: 39.2 % (ref 39.0–52.0)
Hemoglobin: 13.1 g/dL (ref 13.0–17.0)
MCH: 31.5 pg (ref 26.0–34.0)
MCHC: 33.4 g/dL (ref 30.0–36.0)
MCV: 94.2 fL (ref 80.0–100.0)
Platelets: 159 10*3/uL (ref 150–400)
RBC: 4.16 MIL/uL — ABNORMAL LOW (ref 4.22–5.81)
RDW: 12.9 % (ref 11.5–15.5)
WBC: 15.8 10*3/uL — ABNORMAL HIGH (ref 4.0–10.5)
nRBC: 0 % (ref 0.0–0.2)

## 2020-05-12 LAB — APTT: aPTT: 26 seconds (ref 24–36)

## 2020-05-12 LAB — ECHOCARDIOGRAM LIMITED
AR max vel: 2.11 cm2
AV Area VTI: 2.86 cm2
AV Area mean vel: 2.39 cm2
AV Mean grad: 5 mmHg
AV Peak grad: 12.1 mmHg
Ao pk vel: 1.74 m/s
Area-P 1/2: 5.97 cm2
Height: 72 in
S' Lateral: 3.8 cm
Weight: 3068.8 oz

## 2020-05-12 LAB — COMPREHENSIVE METABOLIC PANEL
ALT: 102 U/L — ABNORMAL HIGH (ref 0–44)
AST: 93 U/L — ABNORMAL HIGH (ref 15–41)
Albumin: 3.6 g/dL (ref 3.5–5.0)
Alkaline Phosphatase: 75 U/L (ref 38–126)
Anion gap: 10 (ref 5–15)
BUN: 22 mg/dL (ref 8–23)
CO2: 27 mmol/L (ref 22–32)
Calcium: 8.5 mg/dL — ABNORMAL LOW (ref 8.9–10.3)
Chloride: 96 mmol/L — ABNORMAL LOW (ref 98–111)
Creatinine, Ser: 1.4 mg/dL — ABNORMAL HIGH (ref 0.61–1.24)
GFR, Estimated: 51 mL/min — ABNORMAL LOW (ref >60)
Glucose, Bld: 177 mg/dL — ABNORMAL HIGH (ref 70–99)
Potassium: 4.1 mmol/L (ref 3.5–5.1)
Sodium: 133 mmol/L — ABNORMAL LOW (ref 135–145)
Total Bilirubin: 0.5 mg/dL (ref 0.3–1.2)
Total Protein: 6 g/dL — ABNORMAL LOW (ref 6.5–8.1)

## 2020-05-12 LAB — POCT I-STAT, CHEM 8
BUN: 25 mg/dL — ABNORMAL HIGH (ref 8–23)
Calcium, Ion: 1.23 mmol/L (ref 1.15–1.40)
Chloride: 94 mmol/L — ABNORMAL LOW (ref 98–111)
Creatinine, Ser: 1.3 mg/dL — ABNORMAL HIGH (ref 0.61–1.24)
Glucose, Bld: 168 mg/dL — ABNORMAL HIGH (ref 70–99)
HCT: 41 % (ref 39.0–52.0)
Hemoglobin: 13.9 g/dL (ref 13.0–17.0)
Potassium: 4.1 mmol/L (ref 3.5–5.1)
Sodium: 134 mmol/L — ABNORMAL LOW (ref 135–145)
TCO2: 27 mmol/L (ref 22–32)

## 2020-05-12 LAB — LIPID PANEL
Cholesterol: 145 mg/dL (ref 0–200)
HDL: 48 mg/dL (ref >40)
LDL Cholesterol: 81 mg/dL (ref 0–99)
Total CHOL/HDL Ratio: 3 RATIO
Triglycerides: 79 mg/dL (ref <150)
VLDL: 16 mg/dL (ref 0–40)

## 2020-05-12 LAB — HEMOGLOBIN A1C
Hgb A1c MFr Bld: 5.7 % — ABNORMAL HIGH (ref 4.8–5.6)
Mean Plasma Glucose: 116.89 mg/dL

## 2020-05-12 LAB — PROTIME-INR
INR: 1 (ref 0.8–1.2)
Prothrombin Time: 13.3 seconds (ref 11.4–15.2)

## 2020-05-12 LAB — RESP PANEL BY RT-PCR (FLU A&B, COVID) ARPGX2
Influenza A by PCR: NEGATIVE
Influenza B by PCR: NEGATIVE
SARS Coronavirus 2 by RT PCR: NEGATIVE

## 2020-05-12 LAB — TROPONIN I (HIGH SENSITIVITY): Troponin I (High Sensitivity): 28 ng/L — ABNORMAL HIGH (ref <18)

## 2020-05-12 LAB — MRSA PCR SCREENING: MRSA by PCR: NEGATIVE

## 2020-05-12 LAB — SARS CORONAVIRUS 2 (TAT 6-24 HRS): SARS Coronavirus 2: NEGATIVE

## 2020-05-12 SURGERY — BIV PACEMAKER INSERTION CRT-P

## 2020-05-12 SURGERY — TEMPORARY PACEMAKER
Anesthesia: LOCAL

## 2020-05-12 MED ORDER — SODIUM CHLORIDE 0.9 % IV SOLN: 250.0000 mL | INTRAVENOUS | Status: DC | PRN

## 2020-05-12 MED ORDER — LIDOCAINE HCL (PF) 1 % IJ SOLN
INTRAMUSCULAR | Status: DC | PRN
  Administered 2020-05-12: 60 mL

## 2020-05-12 MED ORDER — SODIUM CHLORIDE 0.9 % IV SOLN: INTRAVENOUS | Status: DC

## 2020-05-12 MED ORDER — CEFAZOLIN SODIUM-DEXTROSE 2-4 GM/100ML-% IV SOLN
2.0000 g | INTRAVENOUS | Status: AC
  Administered 2020-05-12: 2 g via INTRAVENOUS
  Filled 2020-05-12: qty 100

## 2020-05-12 MED ORDER — SODIUM CHLORIDE 0.9 % IV SOLN
250.0000 mL | INTRAVENOUS | Status: DC
  Administered 2020-05-12: 250 mL via INTRAVENOUS

## 2020-05-12 MED ORDER — NOREPINEPHRINE 4 MG/250ML-% IV SOLN: 0.0000 ug/min | INTRAVENOUS | Status: DC

## 2020-05-12 MED ORDER — HEPARIN (PORCINE) IN NACL 1000-0.9 UT/500ML-% IV SOLN
INTRAVENOUS | Status: DC | PRN
  Administered 2020-05-12: 500 mL

## 2020-05-12 MED ORDER — LIDOCAINE HCL (PF) 1 % IJ SOLN
INTRAMUSCULAR | Status: DC | PRN
  Administered 2020-05-12: 15 mL

## 2020-05-12 MED ORDER — HEPARIN (PORCINE) IN NACL 1000-0.9 UT/500ML-% IV SOLN
INTRAVENOUS | Status: AC
  Filled 2020-05-12: qty 500

## 2020-05-12 MED ORDER — CEFAZOLIN SODIUM-DEXTROSE 1-4 GM/50ML-% IV SOLN
1.0000 g | Freq: Four times a day (QID) | INTRAVENOUS | Status: AC
  Administered 2020-05-12 – 2020-05-13 (×3): 1 g via INTRAVENOUS
  Filled 2020-05-12 (×3): qty 50

## 2020-05-12 MED ORDER — CEFAZOLIN SODIUM-DEXTROSE 2-4 GM/100ML-% IV SOLN
INTRAVENOUS | Status: AC
  Filled 2020-05-12: qty 100

## 2020-05-12 MED ORDER — MIDAZOLAM HCL 5 MG/5ML IJ SOLN
INTRAMUSCULAR | Status: DC | PRN
  Administered 2020-05-12: 1 mg via INTRAVENOUS

## 2020-05-12 MED ORDER — ACETAMINOPHEN 325 MG PO TABS: 325.0000 mg | ORAL_TABLET | ORAL | Status: DC | PRN

## 2020-05-12 MED ORDER — NOREPINEPHRINE 4 MG/250ML-% IV SOLN
INTRAVENOUS | Status: AC
  Administered 2020-05-12: 5 ug/min via INTRAVENOUS
  Filled 2020-05-12: qty 250

## 2020-05-12 MED ORDER — ATORVASTATIN CALCIUM 80 MG PO TABS
80.0000 mg | ORAL_TABLET | Freq: Every day | ORAL | Status: DC
  Administered 2020-05-12: 80 mg via ORAL
  Filled 2020-05-12: qty 1

## 2020-05-12 MED ORDER — SODIUM CHLORIDE 0.9 % IV SOLN
INTRAVENOUS | Status: AC
  Filled 2020-05-12: qty 2

## 2020-05-12 MED ORDER — HYDROCODONE-ACETAMINOPHEN 5-325 MG PO TABS: 1.0000 | ORAL_TABLET | ORAL | Status: DC | PRN

## 2020-05-12 MED ORDER — SODIUM CHLORIDE 0.9 % IV SOLN
INTRAVENOUS | Status: DC | PRN
  Administered 2020-05-12: 500 mL

## 2020-05-12 MED ORDER — SODIUM CHLORIDE 0.9 % IV SOLN
INTRAVENOUS | Status: DC
Start: 2020-05-12 — End: 2020-05-12

## 2020-05-12 MED ORDER — DIVALPROEX SODIUM ER 500 MG PO TB24
500.0000 mg | ORAL_TABLET | Freq: Two times a day (BID) | ORAL | Status: DC
  Administered 2020-05-12 – 2020-05-13 (×2): 500 mg via ORAL
  Filled 2020-05-12 (×5): qty 1

## 2020-05-12 MED ORDER — LIDOCAINE HCL 1 % IJ SOLN
INTRAMUSCULAR | Status: AC
  Filled 2020-05-12: qty 20

## 2020-05-12 MED ORDER — LIDOCAINE HCL 1 % IJ SOLN
INTRAMUSCULAR | Status: AC
  Filled 2020-05-12: qty 40

## 2020-05-12 MED ORDER — ASPIRIN EC 81 MG PO TBEC
81.0000 mg | DELAYED_RELEASE_TABLET | Freq: Every day | ORAL | Status: DC
  Administered 2020-05-13: 81 mg via ORAL
  Filled 2020-05-12: qty 1

## 2020-05-12 MED ORDER — FAMOTIDINE 20 MG PO TABS
20.0000 mg | ORAL_TABLET | Freq: Every day | ORAL | Status: DC
  Administered 2020-05-12: 20 mg via ORAL
  Filled 2020-05-12: qty 1

## 2020-05-12 MED ORDER — HEPARIN (PORCINE) IN NACL 1000-0.9 UT/500ML-% IV SOLN
INTRAVENOUS | Status: DC | PRN
  Administered 2020-05-12 (×2): 500 mL

## 2020-05-12 MED ORDER — LIDOCAINE HCL (PF) 1 % IJ SOLN
INTRAMUSCULAR | Status: AC
  Filled 2020-05-12: qty 30

## 2020-05-12 MED ORDER — SODIUM CHLORIDE 0.9 % IV SOLN
80.0000 mg | INTRAVENOUS | Status: AC
  Administered 2020-05-12: 80 mg
  Filled 2020-05-12: qty 2

## 2020-05-12 MED ORDER — ONDANSETRON HCL 4 MG/2ML IJ SOLN: 4.0000 mg | Freq: Four times a day (QID) | INTRAMUSCULAR | Status: DC | PRN

## 2020-05-12 MED ORDER — SODIUM CHLORIDE 0.9 % IV SOLN
INTRAVENOUS | Status: AC | PRN
  Administered 2020-05-12: 10 mL/h via INTRAVENOUS

## 2020-05-12 MED ORDER — MIDAZOLAM HCL 5 MG/5ML IJ SOLN
INTRAMUSCULAR | Status: AC
  Filled 2020-05-12: qty 5

## 2020-05-12 MED ORDER — PERFLUTREN LIPID MICROSPHERE
1.0000 mL | INTRAVENOUS | Status: DC | PRN
  Administered 2020-05-12: 5 mL via INTRAVENOUS
  Filled 2020-05-12: qty 10

## 2020-05-12 MED ORDER — CHLORHEXIDINE GLUCONATE 4 % EX LIQD: 60.0000 mL | Freq: Once | CUTANEOUS | Status: DC

## 2020-05-12 MED ORDER — SODIUM CHLORIDE 0.9% FLUSH
3.0000 mL | Freq: Two times a day (BID) | INTRAVENOUS | Status: DC
  Administered 2020-05-12: 3 mL via INTRAVENOUS

## 2020-05-12 MED ORDER — SODIUM CHLORIDE 0.9% FLUSH: 3.0000 mL | INTRAVENOUS | Status: DC | PRN

## 2020-05-12 MED ORDER — CARBAMAZEPINE 200 MG PO TABS
400.0000 mg | ORAL_TABLET | Freq: Two times a day (BID) | ORAL | Status: DC
  Administered 2020-05-12 – 2020-05-13 (×2): 400 mg via ORAL
  Filled 2020-05-12 (×4): qty 2

## 2020-05-12 MED ORDER — TAMSULOSIN HCL 0.4 MG PO CAPS
0.4000 mg | ORAL_CAPSULE | Freq: Every day | ORAL | Status: DC
  Administered 2020-05-12: 0.4 mg via ORAL
  Filled 2020-05-12: qty 1

## 2020-05-12 MED ORDER — IOHEXOL 350 MG/ML SOLN
INTRAVENOUS | Status: DC | PRN
  Administered 2020-05-12: 5 mL
  Administered 2020-05-12: 15 mL

## 2020-05-12 SURGICAL SUPPLY — 16 items
ADAPTER SEALING SSA-EW-09 (ADAPTER) ×2 IMPLANT
BALLN COR SINUS VENO 6FR 80 (BALLOONS) ×2 IMPLANT
BALLOON COR SINUS VENO 6FR 80 (BALLOONS) ×1 IMPLANT
CABLE SURGICAL S-101-97-12 (CABLE) ×2 IMPLANT
CATH ATTAIN COM SURV 6250V-MB2 (CATHETERS) ×2 IMPLANT
CATH JOSEPH QUAD ALLRED 6F REP (CATHETERS) ×2 IMPLANT
KIT MICROPUNCTURE NIT STIFF (SHEATH) ×2 IMPLANT
LEAD TENDRIL MRI 52CM LPA1200M (Lead) ×2 IMPLANT
LEAD TENDRIL MRI 58CM LPA1200M (Lead) ×2 IMPLANT
PACEMAKER ASSURITY DR-RF (Pacemaker) ×2 IMPLANT
PAD PRO RADIOLUCENT 2001M-C (PAD) ×2 IMPLANT
SHEATH 8FR PRELUDE SNAP 13 (SHEATH) ×4 IMPLANT
SHEATH 9FR PRELUDE SNAP 13 (SHEATH) ×2 IMPLANT
TRAY PACEMAKER INSERTION (PACKS) ×2 IMPLANT
WIRE ACUITY WHISPER EDS 4648 (WIRE) ×2 IMPLANT
WIRE HI TORQ VERSACORE-J 145CM (WIRE) ×2 IMPLANT

## 2020-05-12 SURGICAL SUPPLY — 8 items
CABLE ADAPT PACING TEMP 12FT (ADAPTER) ×2 IMPLANT
CATH S G BIP PACING (CATHETERS) ×2 IMPLANT
KIT HEART LEFT (KITS) IMPLANT
PACK CARDIAC CATHETERIZATION (CUSTOM PROCEDURE TRAY) ×2 IMPLANT
SHEATH PINNACLE 6F 10CM (SHEATH) ×2 IMPLANT
SLEEVE REPOSITIONING LENGTH 30 (MISCELLANEOUS) ×2 IMPLANT
TRANSDUCER W/STOPCOCK (MISCELLANEOUS) IMPLANT
TUBING CIL FLEX 10 FLL-RA (TUBING) IMPLANT

## 2020-05-12 NOTE — Interval H&P Note (Signed)
History and Physical Interval Note:  05/12/2020 3:08 PM  Grant Stevens  has presented today for surgery, with the diagnosis of Complete Heart Block.  The various methods of treatment have been discussed with the patient and family. After consideration of risks, benefits and other options for treatment, the patient has consented to  Procedure(s): BIV PACEMAKER INSERTION CRT-P (N/A) as a surgical intervention.  The patient's history has been reviewed, patient examined, no change in status, stable for surgery.  I have reviewed the patient's chart and labs.  Questions were answered to the patient's satisfaction.     Thompson Grayer

## 2020-05-12 NOTE — Progress Notes (Incomplete)
  Echocardiogram 2D Echocardiogram has been performed.  Grant Stevens  [name] 05/12/2020, 1:11 PM

## 2020-05-12 NOTE — ED Notes (Addendum)
Pacing started by Cardiologist Martinique en route to cath lab. Levophed 5mg  started en route to cath lab room 3 verbal order by Martinique, Cardiology MD. Pharmacy at bedside. Syncopal episode that lasted no more than one minute upon arrival to cath lab.

## 2020-05-12 NOTE — ED Triage Notes (Signed)
Per ems, pt was sitting at [place] this morning when he had an episode of dizziness then syncopal episode for about 2-3 minutes, weak and slow pulse noted. Pt has passed out about 4-5x with ems that lasted 2-3 minutes each. EMS gave 0.5 Atropine at 1015. STEMI called by EMS. CBG 124 with ems. Pt denies pain, A&Ox4 in triage. Hx of bypass, MI and CHF.

## 2020-05-12 NOTE — ED Notes (Addendum)
Transported at this time to cath lab room 3 with Dian Situ, EMT, Arta Bruce, EMT, Pharmacist, and Martinique, MD with cardiology. Translucent pads placed on patient and transported on [place].

## 2020-05-12 NOTE — H&P (Addendum)
ELECTROPHYSIOLOGY H&P NOTE    Patient ID: Grant Stevens MRN: [id number], DOB/AGE: [birth year] 80 y.o.  Admit date: 05/12/2020 Date of Consult: 05/12/2020  Primary Physician: [name], MD Primary Cardiologist: Rozann Lesches, MD  Electrophysiologist: New to Dr. Rayann Heman  Referring Provider: Dr. Martinique  Patient Profile: Grant Stevens is a 80 y.o. male with a history of CAD (s/p CABG in 2002), chronic combined systolic and diastolic CHF/ICM (EF [id number] by echo in 10/2018 and he had declined ICD placement), HTN and HLD who is being seen today for the evaluation of CHB at the request of Dr. Martinique.  HPI:  Grant Stevens is a 80 y.o. male with medical history as above.   Pt was USOH until this am when eating breakfast and had a syncopal event. He was a "bit dizzy" just prior to. CHB noted on EMS arrival, he was given atropine without significant improvement but was not paced en-route, and did have several other syncopal events.   He was taken emergently to the cath lab on arrival with HRs in the 16-20 range. He required 120 ma output on external pacing for capture.   Pertinent labs on admission include COVID negative, K 4.1, Cr 1.30, AST 93, ALT 102, HS-trop 28, WBC 15.8, Hgb 13.9. Hgb A1c 5.7.  Pt currently awake and alert post temp pacer on my exam. He repeats history above. Denies any recent illness. He denies chest pain, SOB, or h/o syncope. Prior to his CABG, his symptoms were SOB and diaphoresis with mid chest pressure. He denies any recurrence.   Echo 10/2018 LVEF 30-35% Myoview 11/2013 was stopped due to fatigue but was negative.   Past Medical History:  Diagnosis Date  . Allergic rhinitis   . Arthritis   . Asthma    Childhood  . Atrial fibrillation ([place])    Remote history - WARCEF  . Bladder cancer ([place])   . BPH (benign prostatic hyperplasia)   . Cardiomyopathy ([place])   . CHF (congestive heart failure) ([place])   . Coronary atherosclerosis of native coronary artery     Multivessel s/p CABG in 06/2001 post-IMI, LVEF 35% up to 50% postoperatively;  . Depression   . Difficulty sleeping   . Erectile dysfunction   . Essential hypertension   . Frequency of urination   . History of bipolar disorder   . Hyperlipidemia   . IBS (irritable bowel syndrome)   . Myocardial infarction [facility]) 2002     Surgical History:  Past Surgical History:  Procedure Laterality Date  . BIOPSY  03/28/2018   Procedure: BIOPSY;  Surgeon: Rogene Houston, MD;  [facility];  Service: Endoscopy;;  esophagus  . CATARACT EXTRACTION W/PHACO Left 12/29/2014   Procedure: CATARACT EXTRACTION PHACO AND INTRAOCULAR LENS PLACEMENT (IOC);  Surgeon: Williams Che, MD;  Location: [facility] ORS;  Service: Ophthalmology;  Laterality: Left;  CDE:3.71  . CATARACT EXTRACTION W/PHACO Right 03/30/2015   Procedure: CATARACT EXTRACTION PHACO AND INTRAOCULAR LENS PLACEMENT RIGHT EYE CDE=2.56;  Surgeon: Williams Che, MD;  Location: [facility] ORS;  Service: Ophthalmology;  Laterality: Right;  . CIRCUMCISION  10/2003  . COLONOSCOPY N/A 07/11/2012   Procedure: COLONOSCOPY;  Surgeon: Rogene Houston, MD;  Location: [facility];  Service: Endoscopy;  Laterality: N/A;  830-moved to 930 Ann to notify pt  . CORONARY ARTERY BYPASS GRAFT  08/2000   Dr. Servando Snare - LIMA to LAD, SVG to diagonal, SVG to PDA TRIPLE BYPASS  . CYSTOSCOPY W/ RETROGRADES Bilateral  03/02/2015   Procedure: CYSTOSCOPY WITH RIGHT RETROGRADE PYELOGRAM,  ATTEMPTED LEFT RETROGRADE PYELOGRAM;  Surgeon: Cleon Gustin, MD;  Location: [facility] OR[facility];  Service: Urology;  Laterality: Bilateral;  . ESOPHAGEAL DILATION N/A 03/28/2018   Procedure: ESOPHAGEAL DILATION;  Surgeon: Rogene Houston, MD;  Location: [facility];  Service: Endoscopy;  Laterality: N/A;  . ESOPHAGOGASTRODUODENOSCOPY N/A 03/28/2018   Procedure: ESOPHAGOGASTRODUODENOSCOPY (EGD);  Surgeon: Rogene Houston, MD;  Location: [facility];  Service: Endoscopy;  Laterality: N/A;  120  .  TONSILLECTOMY    . TRANSURETHRAL RESECTION OF BLADDER TUMOR N/A 01/26/2015   Procedure: TRANSURETHRAL RESECTION OF BLADDER TUMOR (TURBT);  Surgeon: Cleon Gustin, MD;  Location: [facility];  Service: Urology;  Laterality: N/A;  . TRANSURETHRAL RESECTION OF BLADDER TUMOR WITH GYRUS (TURBT-GYRUS) N/A 03/02/2015   Procedure: TRANSURETHRAL RESECTION OF BLADDER TUMOR WITH GYRUS (TURBT-GYRUS);  Surgeon: Cleon Gustin, MD;  Location: [facility];  Service: Urology;  Laterality: N/A;  . TRANSURETHRAL RESECTION OF PROSTATE N/A 01/26/2015   Procedure: TRANSURETHRAL RESECTION OF THE PROSTATE WITH GYRUS INSTRUMENTS;  Surgeon: Cleon Gustin, MD;  Location: [facility] ORS;  Service: Urology;  Laterality: N/A;     Medications Prior to Admission  Medication Sig Dispense Refill Last Dose  . aspirin EC 81 MG tablet Take 1 tablet (81 mg total) by mouth daily.     Marland Kitchen atorvastatin (LIPITOR) 80 MG tablet Take 80 mg by mouth at bedtime.       . carbamazepine (TEGRETOL) 200 MG tablet Take 400 mg by mouth 2 (two) times daily.      . divalproex (DEPAKOTE ER) 250 MG 24 hr tablet Take 500 mg by mouth in the morning and at bedtime.      . famotidine (PEPCID) 40 MG tablet Take 1 tablet PO at bedtime. Due for yearly office visit for refills. 30 tablet 0   . furosemide (LASIX) 40 MG tablet Take 40 mg by mouth daily. May take additional tablet for 3 lbs wt gain over night     . lisinopril (ZESTRIL) 5 MG tablet TAKE (1) TABLET BY MOUTH ONCE DAILY. (Patient taking differently: Take 5 mg by mouth daily.) 90 tablet 3   . LORazepam (ATIVAN) 0.5 MG tablet Take 0.5 mg by mouth at bedtime.     . psyllium (METAMUCIL) 58.6 % packet Take 1 packet by mouth 2 (two) times daily.     . tamsulosin (FLOMAX) 0.4 MG CAPS capsule TAKE (1) CAPSULE BY MOUTH AT BEDTIME. 90 capsule 0     Inpatient Medications:  . aspirin EC  81 mg Oral Daily  . atorvastatin  80 mg Oral QHS  . carbamazepine  400 mg Oral BID  . divalproex  500 mg Oral BID AC & HS  .  famotidine  20 mg Oral Daily  . tamsulosin  0.4 mg Oral QPC supper    Allergies: No Known Allergies  Social History   Socioeconomic History  . Marital status: Widowed    Spouse name: Not on file  . Number of children: Not on file  . Years of education: Not on file  . Highest education level: Not on file  Occupational History  . Not on file  Tobacco Use  . Smoking status: Former Smoker    Packs/day: 1.00    Years: 15.00    Pack years: 15.00    Types: Cigarettes    Quit date: 01/11/1971    Years since quitting: 49.3  . Smokeless tobacco: Never Used  Vaping Use  . Vaping Use: Never used  Substance and Sexual Activity  . Alcohol use: Not Currently    Comment: occ.  . Drug use: No  . Sexual activity: Not on file  Other Topics Concern  . Not on file  Social History Narrative   Married with no children    Exercises 4 times weekly   Caffeine once a day   Social Determinants of Health   Financial Resource Strain: Not on file  Food Insecurity: Not on file  Transportation Needs: Not on file  Physical Activity: Not on file  Stress: Not on file  Social Connections: Not on file  Intimate Partner Violence: Not on file     Family History  Problem Relation Age of Onset  . Asthma Mother   . Heart attack Father 36  . Diabetes Brother      Review of Systems: All other systems reviewed and are otherwise negative except as noted above.  Physical Exam: Vitals:   05/12/20 1101 05/12/20 1106 05/12/20 1111 05/12/20 1130  BP: (!) 153/78 114/72 105/73 98/61  Pulse: 80 80 75 79  Resp: 17 13 15 10   Temp:    98 F (36.7 C)  TempSrc:    Oral  SpO2: 100% 100% 100% 97%  Weight:    87 kg  Height:    6' (1.829 m)    GEN- The patient is well appearing, alert and oriented x 3 today.   HEENT: normocephalic, atraumatic; sclera clear, conjunctiva pink; hearing intact; oropharynx clear; neck supple Lungs- Clear to ausculation bilaterally, normal work of breathing.  No wheezes, rales,  rhonchi Heart- Regular rate and rhythm, no murmurs, rubs or gallops GI- soft, non-tender, non-distended, bowel sounds present Extremities- no clubbing, cyanosis, or edema; DP/PT/radial pulses 2+ bilaterally MS- no significant deformity or atrophy Skin- warm and dry, no rash or lesion Psych- euthymic mood, full affect Neuro- strength and sensation are intact  Labs:   Lab Results  Component Value Date   WBC 15.8 (H) 05/12/2020   HGB 13.9 05/12/2020   HCT 41.0 05/12/2020   MCV 94.2 05/12/2020   PLT 159 05/12/2020    Recent Labs  Lab 05/12/20 1101 05/12/20 1102  NA 133* 134*  K 4.1 4.1  CL 96* 94*  CO2 27  --   BUN 22 25*  CREATININE 1.40* 1.30*  CALCIUM 8.5*  --   PROT 6.0*  --   BILITOT 0.5  --   ALKPHOS 75  --   ALT 102*  --   AST 93*  --   GLUCOSE 177* 168*      Radiology/Studies: CARDIAC CATHETERIZATION  Result Date: 05/12/2020 Successful placement of a temporary transvenous pacemaker in the RV Plan: continue pacing. EP to evaluate for a permanent device. Will monitor in the ICU. Labs are pending.   EKG: on arrival showed HR of 19 with CHB (personally reviewed)  Baseline EKG 04/2019 showed NSR at 72 bpm with RBBB (174ms) and 1st degree AV block at baseline (300 ms)  TELEMETRY:  V pacing at 80 (personally reviewed)  Assessment/Plan: 1.  Symptomatic bradycardia due to complete heart block With no reversible causes identified No AV nodal agents No recent illnesses. H/o CAD but not felt to need updated ischemic eval.  Baseline RBBB and 1st degree AV block at baseline. Will update echo for completeness Explained risks, benefits, and alternatives to PPM implantation, including but not limited to bleeding, infection, pneumothorax, pericardial effusion, lead dislodgement, heart attack, stroke, or death.  Pt verbalized understanding and agrees to proceed.  2. CAD s/p CABG 2002 Anginal equivalent was SOB and diaphoresis Denies any s/s ischemia. Updating Echo as  above for completeness. Dr. Martinique does not feel strongly about repeat ischemic work up with lack of chest pain/symptoms.   For questions or updates, please contact [place] Please consult [email or web address] for contact info under Cardiology/STEMI.  Signed, Shirley Friar, PA-C  05/12/2020 12:12 PM    I have seen, examined the patient, and reviewed the above assessment and plan.  Changes to above are made where necessary.  On exam, RRR (temp paced).   The patient has symptomatic complete heart block requiring emergent temporary pacing.  He has EF 30% chronically.  We discussed options of CRT-P and CRT-D at length today.  We discussed pros and cons to both pacemaker and ICD at length.  We also discussed risks related to implant. Using a shared decision making process, the patient is clear that he would prefer to proceed with a biventricular pacemaker and avoid an ICD.  He states that he has a family member with an ICD who has been shocked and he has not interest in shock therapy.  I would therefore recommend biventricular pacemaker implantation at this time. If he does not have suitable anatomy, then I would perform conduction system pacing.  Risks, benefits, alternatives to biventricular pacemaker implantation were discussed in detail with the patient today. The patient understands that the risks include but are not limited to bleeding, infection, pneumothorax, perforation, tamponade, vascular damage, renal failure, MI, stroke, death,  and lead dislodgement and wishes to proceed at this time.    Co Sign: Thompson Grayer, MD 05/12/2020 2:32 PM

## 2020-05-12 NOTE — Progress Notes (Signed)
Orthopedic Tech Progress Note Patient Details:  Grant Stevens 11-21-1940 [id number] Came to apply KNEE IMMOBILIZER but at this moment patient is having a something done to chest. Will return later Patient ID: Grant Stevens, male   DOB: 19-Mar-1940, 80 y.o.   MRN: [id number]   Janit Pagan 05/12/2020, 12:51 PM

## 2020-05-12 NOTE — Progress Notes (Signed)
Belongings taken to new patient room 6E24. Belongings included: shoes, pants, belt, cellphone, wallet, and keys.

## 2020-05-12 NOTE — ED Notes (Signed)
Two belongings bags labeled and given to cath lab staff.

## 2020-05-12 NOTE — Progress Notes (Signed)
Chaplain came to ER for possible STEMI. Patient not available. Chaplain learned patient was in a restaurant in [place] when event happened.  He came alone to hospital.  Family has been notified. Chaplain will refer to chaplain that covers cardio unit. Rev. Tamsen Snider Pager [phone]

## 2020-05-12 NOTE — ED Provider Notes (Signed)
[facility] EMERGENCY DEPARTMENT Provider Note   CSN: [id number] Arrival date & time:        History Chief Complaint  Patient presents with  . Abnormal ECG  . Loss of Consciousness    Grant Stevens is a 80 y.o. male.  80 yO M with a chief complaints of a syncopal event.  This happened while he was eating breakfast this morning.  He felt a bit dizzy before it happened.  EMS was called and he was found to be in complete heart block.  Was given atropine without significant improvement.  Did not require pacing.  Loss consciousness a couple more times on the way to the ED.  Patient denies any chest pain or shortness of breath denies headache or neck pain.  Was in his normal state of health yesterday.  The history is provided by the patient and the EMS personnel.  Loss of Consciousness Episode history:  Multiple Most recent episode:  Today Duration:  2 seconds Timing:  Intermittent Progression:  Partially resolved Chronicity:  New Witnessed: yes   Relieved by:  Nothing Worsened by:  Nothing Ineffective treatments:  None tried Associated symptoms: no chest pain, no confusion, no fever, no headaches, no palpitations, no shortness of breath and no vomiting        Past Medical History:  Diagnosis Date  . Allergic rhinitis   . Arthritis   . Asthma    Childhood  . Atrial fibrillation ([place])    Remote history - WARCEF  . Bladder cancer ([place])   . BPH (benign prostatic hyperplasia)   . Cardiomyopathy ([place])   . CHF (congestive heart failure) ([place])   . Coronary atherosclerosis of native coronary artery    Multivessel s/p CABG in 06/2001 post-IMI, LVEF 35% up to 50% postoperatively;  . Depression   . Difficulty sleeping   . Erectile dysfunction   . Essential hypertension   . Frequency of urination   . History of bipolar disorder   . Hyperlipidemia   . IBS (irritable bowel syndrome)   . Myocardial infarction [facility]) 2002    Patient Active Problem List    Diagnosis Date Noted  . Acute infectious diarrhea 05/11/2020  . Food impaction of esophagus 03/20/2018  . BPH (benign prostatic hyperplasia) 01/26/2015  . Dizziness 10/19/2014  . Coronary atherosclerosis of native coronary artery   . History of atrial fibrillation   . Essential hypertension, benign 09/22/2010  . Mixed hyperlipidemia 11/28/2008  . SLEEP APNEA 11/28/2008    Past Surgical History:  Procedure Laterality Date  . BIOPSY  03/28/2018   Procedure: BIOPSY;  Surgeon: Rogene Houston, MD;  Location: [facility] ENDO SU[facility];  Service: Endoscopy;;  esophagus  . CATARACT EXTRACTION W/PHACO Left 12/29/2014   Procedure: CATARACT EXTRACTION PHACO AND INTRAOCULAR LENS PLACEMENT (IOC);  Surgeon: Williams Che, MD;  Location: [facility] ORS;  Service: Ophthalmology;  Laterality: Left;  CDE:3.71  . CATARACT EXTRACTION W/PHACO Right 03/30/2015   Procedure: CATARACT EXTRACTION PHACO AND INTRAOCULAR LENS PLACEMENT RIGHT EYE CDE=2.56;  Surgeon: Williams Che, MD;  Location: [facility] ORS;  Service: Ophthalmology;  Laterality: Right;  . CIRCUMCISION  10/2003  . COLONOSCOPY N/A 07/11/2012   Procedure: COLONOSCOPY;  Surgeon: Rogene Houston, MD;  Location: [facility] ENDO SU[facility];  Service: Endoscopy;  Laterality: N/A;  830-moved to 930 Ann to notify pt  . CORONARY ARTERY BYPASS GRAFT  08/2000   Dr. Servando Snare - LIMA to LAD, SVG to diagonal, SVG to PDA TRIPLE BYPASS  .  CYSTOSCOPY W/ RETROGRADES Bilateral 03/02/2015   Procedure: CYSTOSCOPY WITH RIGHT RETROGRADE PYELOGRAM,  ATTEMPTED LEFT RETROGRADE PYELOGRAM;  Surgeon: Cleon Gustin, MD;  Location: [facility] ORS;  Service: Urology;  Laterality: Bilateral;  . ESOPHAGEAL DILATION N/A 03/28/2018   Procedure: ESOPHAGEAL DILATION;  Surgeon: Rogene Houston, MD;  Location: [facility];  Service: Endoscopy;  Laterality: N/A;  . ESOPHAGOGASTRODUODENOSCOPY N/A 03/28/2018   Procedure: ESOPHAGOGASTRODUODENOSCOPY (EGD);  Surgeon: Rogene Houston, MD;  Location[facility];  Service:  Endoscopy;  Laterality: N/A;  120  . TONSILLECTOMY    . TRANSURETHRAL RESECTION OF BLADDER TUMOR N/A 01/26/2015   Procedure: TRANSURETHRAL RESECTION OF BLADDER TUMOR (TURBT);  Surgeon: Cleon Gustin, MD;  Location: [facility];  Service: Urology;  Laterality: N/A;  . TRANSURETHRAL RESECTION OF BLADDER TUMOR WITH GYRUS (TURBT-GYRUS) N/A 03/02/2015   Procedure: TRANSURETHRAL RESECTION OF BLADDER TUMOR WITH GYRUS (TURBT-GYRUS);  Surgeon: Cleon Gustin, MD;  Location[facility];  Service: Urology;  Laterality: N/A;  . TRANSURETHRAL RESECTION OF PROSTATE N/A 01/26/2015   Procedure: TRANSURETHRAL RESECTION OF THE PROSTATE WITH GYRUS INSTRUMENTS;  Surgeon: Cleon Gustin, MD;  Location: [facility];  Service: Urology;  Laterality: N/A;       Family History  Problem Relation Age of Onset  . Asthma Mother   . Heart attack Father 62  . Diabetes Brother     Social History   Tobacco Use  . Smoking status: Former Smoker    Packs/day: 1.00    Years: 15.00    Pack years: 15.00    Types: Cigarettes    Quit date: 01/11/1971    Years since quitting: 49.3  . Smokeless tobacco: Never Used  Vaping Use  . Vaping Use: Never used  Substance Use Topics  . Alcohol use: Not Currently    Comment: occ.  . Drug use: No    Home Medications Prior to Admission medications   Medication Sig Start Date End Date Taking? Authorizing Provider  aspirin EC 81 MG tablet Take 1 tablet (81 mg total) by mouth daily. 03/31/18   Rehman, Mechele Dawley, MD  atorvastatin (LIPITOR) 80 MG tablet Take 80 mg by mouth at bedtime.      [provider]  carbamazepine (TEGRETOL) 200 MG tablet Take 400 mg by mouth 2 (two) times daily.     [provider]  divalproex (DEPAKOTE ER) 250 MG 24 hr tablet Take 500 mg by mouth in the morning and at bedtime.  03/12/18   [provider]  famotidine (PEPCID) 40 MG tablet Take 1 tablet PO at bedtime. Due for yearly office visit for refills. 01/31/20   Harvel Quale, MD  furosemide (LASIX) 40 MG tablet Take 40 mg by mouth daily. May take additional tablet for 3 lbs wt gain over night    [provider]  lisinopril (ZESTRIL) 5 MG tablet TAKE (1) TABLET BY MOUTH ONCE DAILY. Patient taking differently: Take 5 mg by mouth daily. 07/24/19   [name], MD  LORazepam (ATIVAN) 0.5 MG tablet Take 0.5 mg by mouth at bedtime.    [provider]  psyllium (METAMUCIL) 58.6 % packet Take 1 packet by mouth 2 (two) times daily.    [provider]  tamsulosin (FLOMAX) 0.4 MG CAPS capsule TAKE (1) CAPSULE BY MOUTH AT BEDTIME. 04/01/20   McKenzie, Candee Furbish, MD    Allergies    Patient has no known allergies.  Review of Systems   Review of Systems  Constitutional:  Negative for chills and fever.  HENT: Negative for congestion and facial swelling.   Eyes: Negative for discharge and visual disturbance.  Respiratory: Negative for shortness of breath.   Cardiovascular: Positive for syncope. Negative for chest pain and palpitations.  Gastrointestinal: Negative for abdominal pain, diarrhea and vomiting.  Musculoskeletal: Negative for arthralgias and myalgias.  Skin: Negative for color change and rash.  Neurological: Positive for syncope. Negative for tremors and headaches.  Psychiatric/Behavioral: Negative for confusion and dysphoric mood.    Physical Exam Updated Vital Signs BP (!) 112/54 (BP Location: Right Arm)   Pulse (!) 17   Temp (!) 96 F (35.6 C) (Temporal)   Resp 14   SpO2 98%   Physical Exam Vitals and nursing note reviewed.  Constitutional:      Appearance: He is well-developed.  HENT:     Head: Normocephalic and atraumatic.  Eyes:     Pupils: Pupils are equal, round, and reactive to light.  Neck:     Vascular: No JVD.  Cardiovascular:     Rate and Rhythm: Bradycardia present. Rhythm irregular.     Heart sounds: No murmur heard. No friction rub. No gallop.   Pulmonary:     Effort: No respiratory  distress.     Breath sounds: No wheezing.  Abdominal:     General: There is no distension.     Tenderness: There is no abdominal tenderness. There is no guarding or rebound.  Musculoskeletal:        General: Normal range of motion.     Cervical back: Normal range of motion and neck supple.  Skin:    Coloration: Skin is not pale.     Findings: No rash.  Neurological:     Mental Status: He is alert and oriented to person, place, and time.  Psychiatric:        Behavior: Behavior normal.     ED Results / Procedures / Treatments   Labs (all labs ordered are listed, but only abnormal results are displayed) Labs Reviewed  RESP PANEL BY RT-PCR (FLU A&B, COVID) [id number]    EKG None  Radiology No results found.  Procedures Procedures   Medications Ordered in ED Medications  norepinephrine (LEVOPHED) 4-5 MG/250ML-% infusion SOLN (has no administration in time range)    ED Course  I have reviewed the triage vital signs and the nursing notes.  Pertinent labs & imaging results that were available during my care of the patient were reviewed by me and considered in my medical decision making (see chart for details).    MDM Rules/Calculators/A&P                          80 yo M with a cc of syncopal event.  Found to be in complete heart block with a rate in the 20's.  Awake, BP normal.  Cards at bedside on arrival, taken urgently to the cath lab for temp pace placement.   CRITICAL CARE Performed by: Cecilio Asper   Total critical care time: 35 minutes  Critical care time was exclusive of separately billable procedures and treating other patients.  Critical care was necessary to treat or prevent imminent or life-threatening deterioration.  Critical care was time spent personally by me on the following activities: development of treatment plan with patient and/or surrogate as well as nursing, discussions with consultants, evaluation of patient's response to treatment,  examination of patient, obtaining history from patient or surrogate, ordering and  performing treatments and interventions, ordering and review of laboratory studies, ordering and review of radiographic studies, pulse oximetry and re-evaluation of patient's condition.  Final Clinical Impression(s) / ED Diagnoses Final diagnoses:  Syncope and collapse  Complete heart block [facility])    Rx / DC Orders ED Discharge Orders    None       Deno Etienne, DO 05/12/20 1045

## 2020-05-12 NOTE — Plan of Care (Signed)

## 2020-05-13 ENCOUNTER — Inpatient Hospital Stay (HOSPITAL_COMMUNITY): Payer: PPO

## 2020-05-13 ENCOUNTER — Encounter (HOSPITAL_COMMUNITY): Payer: Self-pay | Admitting: Internal Medicine

## 2020-05-13 MED ORDER — ACETAMINOPHEN 325 MG PO TABS: 325.0000 mg | ORAL_TABLET | ORAL | Status: DC | PRN

## 2020-05-13 NOTE — Progress Notes (Signed)
Pt transported to radiology via wheelchair.

## 2020-05-13 NOTE — Progress Notes (Signed)
Pt is alert and oriented. Discharge instructions/ AVS given to pt. 

## 2020-05-13 NOTE — Discharge Instructions (Signed)
After Your Pacemaker   . You have a St. Jude Pacemaker  ACTIVITY . Do not lift your arm above shoulder height for 1 week after your procedure. After 7 days, you may progress as below.  . You should remove your sling 24 hours after your procedure, unless otherwise instructed by your provider.     Wednesday May 20, 2020  Thursday May 21, 2020 Friday May 22, 2020 Saturday May 23, 2020   . Do not lift, push, pull, or carry anything over 10 pounds with the affected arm until 6 weeks (Wednesday June 24, 2020 ) after your procedure.   . You may drive AFTER your wound check, unless you have been told otherwise by your provider.   . Ask your healthcare provider when you can go back to work   INCISION/Dressing . If you are on a blood thinner such as Coumadin, Xarelto, Eliquis, Plavix, or Pradaxa please confirm with your provider when this should be resumed.   . If large square, outer bandage is left in place, this can be removed after 24 hours from your procedure. Do not remove steri-strips or glue as below.   . Monitor your Pacemaker site for redness, swelling, and drainage. Call the device clinic at [phone] if you experience these symptoms or fever/chills.  . If your incision is sealed with Steri-strips or staples, you may shower 10 days after your procedure or when told by your provider. Do not remove the steri-strips or let the shower hit directly on your site. You may wash around your site with soap and water.    Marland Kitchen Avoid lotions, ointments, or perfumes over your incision until it is well-healed.  . You may use a hot tub or a pool AFTER your wound check appointment if the incision is completely closed.  Marland Kitchen PAcemaker Alerts:  Some alerts are vibratory and others beep. These are NOT emergencies. Please call our office to let us know. If this occurs at night or on weekends, it can wait until the next business day. Send a remote transmission.  . If your device is capable of reading fluid  status (for heart failure), you will be offered monthly monitoring to review this with you.   DEVICE MANAGEMENT . Remote monitoring is used to monitor your pacemaker from home. This monitoring is scheduled every 91 days by our office. It allows [place] to keep an eye on the functioning of your device to ensure it is working properly. You will routinely see your Electrophysiologist annually (more often if necessary).   . You should receive your ID card for your new device in 4-8 weeks. Keep this card with you at all times once received. Consider wearing a medical alert bracelet or necklace.  . Your Pacemaker may be MRI compatible. This will be discussed at your next office visit/wound check.  You should avoid contact with strong electric or magnetic fields.    Do not use amateur (ham) radio equipment or electric (arc) welding torches. MP3 player headphones with magnets should not be used. Some devices are safe to use if held at least 12 inches (30 cm) from your Pacemaker. These include power tools, lawn mowers, and speakers. If you are unsure if something is safe to use, ask your health care provider.   When using your cell phone, hold it to the ear that is on the opposite side from the Pacemaker. Do not leave your cell phone in a pocket over the Pacemaker.   You may safely use  electric blankets, heating pads, computers, and microwave ovens.  Call the office right away if:  You have chest pain.  You feel more short of breath than you have felt before.  You feel more light-headed than you have felt before.  Your incision starts to open up.  This information is not intended to replace advice given to you by your health care provider. Make sure you discuss any questions you have with your health care provider.

## 2020-05-13 NOTE — Discharge Summary (Addendum)
ELECTROPHYSIOLOGY PROCEDURE DISCHARGE SUMMARY    Patient ID: Grant Stevens,  MRN: [id number], DOB/AGE: 80/29/1942 80 y.o.  Admit date: 05/12/2020 Discharge date: 05/13/2020  Primary Care Physician: [name], MD  Primary Cardiologist: Rozann Lesches, MD  Electrophysiologist: Dr. Rayann Heman -> To follow with Dr. Lovena Le in [place]  Primary Discharge Diagnosis:  Symptomatic bradycardia due to complete heart block status post pacemaker implantation this admission  Secondary Discharge Diagnosis:  1st degree AV block RBBB CAD s/p CABG 2002  No Known Allergies   Procedures This Admission:  1.  Implantation of a St. Jude dual chamber PPM on 05/12/2020 by Dr. Rayann Heman. The patient received a St. Jude model number [id number] PPM with model number [id number] right atrial lead and [id number] right ventricular lead. There were no immediate post procedure complications. 2.  CXR on 05/13/20 demonstrated no pneumothorax status post device implantation.   Brief HPI: Grant Stevens is a 80 y.o. male was admitted for syncope and found to be in complete heart block with profound bradycardia in the 10-20s. Taken urgently for temp wire with poor subcutaneous capture. Electrophysiology team asked to see for consideration of PPM implantation.  Past medical history includes above.  The patient has had symptomatic bradycardia without reversible causes identified.  Risks, benefits, and alternatives to PPM implantation were reviewed with the patient who wished to proceed.   Hospital Course:  The patient was admitted and underwent implantation of a St. Jude dual chamber PPM with details as outlined above. LV lead was attempted but patient had no suitable coronary sinus branch. He was monitored on telemetry overnight which demonstrated appropriate pacing.  Left chest was without hematoma or ecchymosis.  The device was interrogated and found to be functioning normally.  CXR was obtained and demonstrated no  pneumothorax status post device implantation.  Wound care, arm mobility, and restrictions were reviewed with the patient.  The patient was examined and considered stable for discharge to home.    Pt is not on any chronic OAC, only baby ASA.   Physical Exam: Vitals:   05/12/20 1701 05/12/20 2020 05/12/20 2350 05/13/20 0413  BP: (!) 162/91 113/64 128/69 126/64  Pulse: 80 65 63 61  Resp: 14 15 20 12   Temp: 97.8 F (36.6 C) 98.4 F (36.9 C)  98.3 F (36.8 C)  TempSrc: Oral Oral  Oral  SpO2: 98% 94% 96% 96%  Weight:    88.5 kg  Height: 6' (1.829 m)       GEN- The patient is well appearing, alert and oriented x 3 today.   HEENT: normocephalic, atraumatic; sclera clear, conjunctiva pink; hearing intact; oropharynx clear; neck supple, no JVP Lymph- no cervical lymphadenopathy Lungs- Clear to ausculation bilaterally, normal work of breathing.  No wheezes, rales, rhonchi Heart- Regular rate and rhythm, no murmurs, rubs or gallops, PMI not laterally displaced GI- soft, non-tender, non-distended, bowel sounds present, no hepatosplenomegaly Extremities- no clubbing, cyanosis, or edema; DP/PT/radial pulses 2+ bilaterally MS- no significant deformity or atrophy Skin- warm and dry, no rash or lesion, left chest without hematoma/ecchymosis Psych- euthymic mood, full affect Neuro- strength and sensation are intact   Labs:   Lab Results  Component Value Date   WBC 15.8 (H) 05/12/2020   HGB 13.9 05/12/2020   HCT 41.0 05/12/2020   MCV 94.2 05/12/2020   PLT 159 05/12/2020    Recent Labs  Lab 05/12/20 1101 05/12/20 1102  NA 133* 134*  K 4.1 4.1  CL 96* 94*  CO2  27  --   BUN 22 25*  CREATININE 1.40* 1.30*  CALCIUM 8.5*  --   PROT 6.0*  --   BILITOT 0.5  --   ALKPHOS 75  --   ALT 102*  --   AST 93*  --   GLUCOSE 177* 168*    Discharge Medications:  Allergies as of 05/13/2020   No Known Allergies     Medication List    TAKE these medications   acetaminophen 325 MG  tablet Commonly known as: TYLENOL Take 1-2 tablets (325-650 mg total) by mouth every 4 (four) hours as needed for mild pain.   aspirin EC 81 MG tablet Take 1 tablet (81 mg total) by mouth daily.   atorvastatin 80 MG tablet Commonly known as: LIPITOR Take 80 mg by mouth at bedtime.   carbamazepine 200 MG tablet Commonly known as: TEGRETOL Take 400 mg by mouth 2 (two) times daily.   divalproex 250 MG 24 hr tablet Commonly known as: DEPAKOTE ER Take 250 mg by mouth in the morning and at bedtime.   famotidine 40 MG tablet Commonly known as: PEPCID Take 1 tablet PO at bedtime. Due for yearly office visit for refills. What changed:   how much to take  how to take this  when to take this  additional instructions   furosemide 40 MG tablet Commonly known as: LASIX Take 40 mg by mouth daily. May take additional tablet for 3 lbs wt gain over night   lisinopril 5 MG tablet Commonly known as: ZESTRIL TAKE (1) TABLET BY MOUTH ONCE DAILY. What changed: See the new instructions.   LORazepam 0.5 MG tablet Commonly known as: ATIVAN Take 0.5 mg by mouth at bedtime.   Psyllium 0.36 g Caps Take 2 capsules by mouth daily.   tamsulosin 0.4 MG Caps capsule Commonly known as: FLOMAX TAKE (1) CAPSULE BY MOUTH AT BEDTIME. What changed: See the new instructions.       Disposition:    Follow-up Information    [facility] Follow up.   Why: on 5/17 at 240 pm for post pacemaker check Contact information: [place] [id number] [phone]              Duration of Discharge Encounter: Greater than 30 minutes including physician time.  Jacalyn Lefevre, PA-C  05/13/2020 9:00 AM    Device interrogation is reviewed and normal CXR reveals stable leads, no ptx.  DC to home with routine wound care and follow-up  Thompson Grayer MD, [place] 05/13/2020 5:26 PM

## 2020-05-14 ENCOUNTER — Telehealth: Payer: Self-pay | Admitting: Internal Medicine

## 2020-05-14 NOTE — Telephone Encounter (Signed)
New Message:    Pt had pacemaker put in on Tuesday and now he has a little swelling, redness and pain around the incision.

## 2020-05-14 NOTE — Telephone Encounter (Signed)
Spoke with pt.  He describes the swelling as minimal.  Advised some slight swelling is normal, advised to monitor for worsening swelling and when to call MD.  Recommedned if possible send a picture through mychart.    May use an ice pack to relieve swelling just do not apply directly to skin/ keep steri-strips dry.    With pt permission called pt sister, Arbie Cookey and provided instructions to her as well.

## 2020-05-15 MED FILL — Lidocaine HCl Local Inj 1%: INTRAMUSCULAR | Qty: 60 | Status: AC

## 2020-05-26 ENCOUNTER — Ambulatory Visit: Payer: PPO

## 2020-05-26 ENCOUNTER — Other Ambulatory Visit: Payer: Self-pay

## 2020-05-26 ENCOUNTER — Ambulatory Visit (INDEPENDENT_AMBULATORY_CARE_PROVIDER_SITE_OTHER): Payer: PPO | Admitting: Emergency Medicine

## 2020-05-26 DIAGNOSIS — I442 Atrioventricular block, complete: Secondary | ICD-10-CM | POA: Diagnosis not present

## 2020-05-26 NOTE — Patient Instructions (Signed)
Place remote monitor within 3-6 feet of where you sleep.Send a remote transmission when you get home. If you need help call [phone] for assistance from the device clinic

## 2020-05-27 ENCOUNTER — Telehealth: Payer: Self-pay | Admitting: Internal Medicine

## 2020-05-27 LAB — CUP PACEART INCLINIC DEVICE CHECK
Battery Remaining Longevity: 129 mo
Battery Voltage: 3.07 V
Brady Statistic RA Percent Paced: 6.7 %
Brady Statistic RV Percent Paced: 99.92 %
Date Time Interrogation Session: 20220517145800
Implantable Lead Implant Date: 20220503
Implantable Lead Implant Date: 20220503
Implantable Lead Location: 753859
Implantable Lead Location: 753860
Implantable Pulse Generator Implant Date: 20220503
Lead Channel Impedance Value: 475 Ohm
Lead Channel Impedance Value: 612.5 Ohm
Lead Channel Pacing Threshold Amplitude: 0.75 V
Lead Channel Pacing Threshold Amplitude: 0.75 V
Lead Channel Pacing Threshold Pulse Width: 0.5 ms
Lead Channel Pacing Threshold Pulse Width: 0.5 ms
Lead Channel Sensing Intrinsic Amplitude: 2.5 mV
Lead Channel Setting Pacing Amplitude: 1 V
Lead Channel Setting Pacing Amplitude: 3.5 V
Lead Channel Setting Pacing Pulse Width: 0.5 ms
Lead Channel Setting Sensing Sensitivity: 2 mV
Pulse Gen Model: 2272
Pulse Gen Serial Number: 3920512

## 2020-05-27 NOTE — Telephone Encounter (Signed)
Patient reports small died drop of blood on hid shirt when he woke up this morning. No redness, drainage or bleeding at this time at the wound site. Patient reports occlusive dressing on groin site where temporary pacer was placed in hospital. Instructed to remove bandage . Patient education done on s/sx of infection and to contact the office if he had any further questions or concerns. Marland Kitchen

## 2020-05-27 NOTE — Telephone Encounter (Signed)
New Message:     Pt said  he had his bandage changed here at the office. Last night he had a little bleeding and on this morning he had a little  blood on his shirt. Also he forgot yesterday to have his groin area with the bandage check where he had the a temporary pacemaker put in on 05-12-20 in the ER.

## 2020-05-27 NOTE — Progress Notes (Signed)
Wound check appointment. Steri-strips removed. Wound without redness or edema. Incision edges approximated, wound well healed. Normal device function. Thresholds, sensing, and impedances consistent with implant measurements. Device programmed at 3.5V/auto capture programmed on for extra safety margin until 3 month visit. Histogram distribution appropriate for patient and level of activity. No mode switches or high ventricular rates noted. Patient educated about wound care, arm mobility, lifting restrictions. ROV with Dr Lovena Le 08/18/20 in [place]. Enrolled in remote follow-up and next remote 08/12/20.

## 2020-05-28 ENCOUNTER — Ambulatory Visit: Payer: PPO

## 2020-05-30 ENCOUNTER — Other Ambulatory Visit: Payer: Self-pay

## 2020-05-30 ENCOUNTER — Encounter (HOSPITAL_COMMUNITY): Payer: Self-pay

## 2020-05-30 ENCOUNTER — Emergency Department (HOSPITAL_COMMUNITY)
Admission: EM | Admit: 2020-05-30 | Discharge: 2020-05-30 | Disposition: A | Discharge status: Home / Self Care | Payer: PPO | Attending: Emergency Medicine | Admitting: Emergency Medicine

## 2020-05-30 DIAGNOSIS — Z8551 Personal history of malignant neoplasm of bladder: Secondary | ICD-10-CM | POA: Insufficient documentation

## 2020-05-30 DIAGNOSIS — Z79899 Other long term (current) drug therapy: Secondary | ICD-10-CM | POA: Diagnosis not present

## 2020-05-30 DIAGNOSIS — I110 Hypertensive heart disease with heart failure: Secondary | ICD-10-CM | POA: Insufficient documentation

## 2020-05-30 DIAGNOSIS — J45909 Unspecified asthma, uncomplicated: Secondary | ICD-10-CM | POA: Insufficient documentation

## 2020-05-30 DIAGNOSIS — I2510 Atherosclerotic heart disease of native coronary artery without angina pectoris: Secondary | ICD-10-CM | POA: Insufficient documentation

## 2020-05-30 DIAGNOSIS — T82838A Hemorrhage of vascular prosthetic devices, implants and grafts, initial encounter: Secondary | ICD-10-CM | POA: Diagnosis not present

## 2020-05-30 DIAGNOSIS — T148XXA Other injury of unspecified body region, initial encounter: Secondary | ICD-10-CM

## 2020-05-30 DIAGNOSIS — Z7982 Long term (current) use of aspirin: Secondary | ICD-10-CM | POA: Insufficient documentation

## 2020-05-30 DIAGNOSIS — Z951 Presence of aortocoronary bypass graft: Secondary | ICD-10-CM | POA: Diagnosis not present

## 2020-05-30 DIAGNOSIS — Z950 Presence of cardiac pacemaker: Secondary | ICD-10-CM | POA: Insufficient documentation

## 2020-05-30 DIAGNOSIS — I10 Essential (primary) hypertension: Secondary | ICD-10-CM

## 2020-05-30 DIAGNOSIS — I509 Heart failure, unspecified: Secondary | ICD-10-CM | POA: Diagnosis not present

## 2020-05-30 DIAGNOSIS — Z87891 Personal history of nicotine dependence: Secondary | ICD-10-CM | POA: Diagnosis not present

## 2020-05-30 MED ORDER — CEPHALEXIN 500 MG PO CAPS: 500.0000 mg | ORAL_CAPSULE | Freq: Three times a day (TID) | ORAL | 0 refills | Status: DC

## 2020-05-30 MED ORDER — CEPHALEXIN 500 MG PO CAPS
500.0000 mg | ORAL_CAPSULE | Freq: Once | ORAL | Status: AC
  Administered 2020-05-30: 500 mg via ORAL
  Filled 2020-05-30: qty 1

## 2020-05-30 NOTE — ED Provider Notes (Signed)
[facility] EMERGENCY DEPARTMENT Provider Note   CSN: [id number] Arrival date & time: 05/30/20  5176     History Chief Complaint  Patient presents with  . pacemaker site bleeding    Not actively bleeding    Grant Stevens is a 80 y.o. male.  The history is provided by the patient.  He has history of hypertension, hyperlipidemia, heart failure, coronary artery disease, heart block with permanent pacemaker insertion and he comes in because of some bleeding from his pacemaker insertion incision.  Pacemaker was placed on 5/3.  He started having some bleeding from it about 3 days ago.  He woke up tonight and noted that there was more blood on his shirt, which he brought in with him.  He denies any pain at the incision and denies fever or chills.   Past Medical History:  Diagnosis Date  . Allergic rhinitis   . Arthritis   . Asthma    Childhood  . Atrial fibrillation ([place])    Remote history - WARCEF  . Bladder cancer ([place])   . BPH (benign prostatic hyperplasia)   . Cardiomyopathy ([place])   . CHF (congestive heart failure) ([place])   . Coronary atherosclerosis of native coronary artery    Multivessel s/p CABG in 06/2001 post-IMI, LVEF 35% up to 50% postoperatively;  . Depression   . Difficulty sleeping   . Erectile dysfunction   . Essential hypertension   . Frequency of urination   . History of bipolar disorder   . Hyperlipidemia   . IBS (irritable bowel syndrome)   . Myocardial infarction [facility]) 2002    Patient Active Problem List   Diagnosis Date Noted  . Heart block AV complete ([place]) 05/12/2020  . CAD (coronary artery disease) of artery bypass graft 05/12/2020  . Syncope and collapse 05/12/2020  . Acute infectious diarrhea 05/11/2020  . Food impaction of esophagus 03/20/2018  . BPH (benign prostatic hyperplasia) 01/26/2015  . Dizziness 10/19/2014  . Coronary atherosclerosis of native coronary artery   . History of atrial fibrillation   . Essential hypertension, benign  09/22/2010  . Mixed hyperlipidemia 11/28/2008  . SLEEP APNEA 11/28/2008    Past Surgical History:  Procedure Laterality Date  . BIOPSY  03/28/2018   Procedure: BIOPSY;  Surgeon: Rogene Houston, MD;  [facility];  Service: Endoscopy;;  esophagus  . BIV PACEMAKER INSERTION CRT-P N/A 05/12/2020   Procedure: BIV PACEMAKER INSERTION CRT-P;  Surgeon: Thompson Grayer, MD;  Location: [place] CV LAB;  Service: Cardiovascular;  Laterality: N/A;  . CATARACT EXTRACTION W/PHACO Left 12/29/2014   Procedure: CATARACT EXTRACTION PHACO AND INTRAOCULAR LENS PLACEMENT (IOC);  Surgeon: Williams Che, MD;  Location: [facility] ORS;  Service: Ophthalmology;  Laterality: Left;  CDE:3.71  . CATARACT EXTRACTION W/PHACO Right 03/30/2015   Procedure: CATARACT EXTRACTION PHACO AND INTRAOCULAR LENS PLACEMENT RIGHT EYE CDE=2.56;  Surgeon: Williams Che, MD;  Location: [facility] ORS;  Service: Ophthalmology;  Laterality: Right;  . CIRCUMCISION  10/2003  . COLONOSCOPY N/A 07/11/2012   Procedure: COLONOSCOPY;  Surgeon: Rogene Houston, MD;  [facility];  Service: Endoscopy;  Laterality: N/A;  830-moved to 930 Ann to notify pt  . CORONARY ARTERY BYPASS GRAFT  08/2000   Dr. Servando Snare - LIMA to LAD, SVG to diagonal, SVG to PDA TRIPLE BYPASS  . CYSTOSCOPY W/ RETROGRADES Bilateral 03/02/2015   Procedure: CYSTOSCOPY WITH RIGHT RETROGRADE PYELOGRAM,  ATTEMPTED LEFT RETROGRADE PYELOGRAM;  Surgeon: Cleon Gustin, MD;  Location: [facility];  Service: Urology;  Laterality: Bilateral;  . ESOPHAGEAL DILATION N/A 03/28/2018   Procedure: ESOPHAGEAL DILATION;  Surgeon: Rogene Houston, MD;  [facility];  Service: Endoscopy;  Laterality: N/A;  . ESOPHAGOGASTRODUODENOSCOPY N/A 03/28/2018   Procedure: ESOPHAGOGASTRODUODENOSCOPY (EGD);  Surgeon: Rogene Houston, MD;  [facility];  Service: Endoscopy;  Laterality: N/A;  120  . TEMPORARY PACEMAKER N/A 05/12/2020   Procedure: TEMPORARY PACEMAKER;  Surgeon:  Martinique, Peter M, MD;  Location: [place] CV LAB;  Service: Cardiovascular;  Laterality: N/A;  . TONSILLECTOMY    . TRANSURETHRAL RESECTION OF BLADDER TUMOR N/A 01/26/2015   Procedure: TRANSURETHRAL RESECTION OF BLADDER TUMOR (TURBT);  Surgeon: Cleon Gustin, MD;  [facility];  Service: Urology;  Laterality: N/A;  . TRANSURETHRAL RESECTION OF BLADDER TUMOR WITH GYRUS (TURBT-GYRUS) N/A 03/02/2015   Procedure: TRANSURETHRAL RESECTION OF BLADDER TUMOR WITH GYRUS (TURBT-GYRUS);  Surgeon: Cleon Gustin, MD;  [facility];  Service: Urology;  Laterality: N/A;  . TRANSURETHRAL RESECTION OF PROSTATE N/A 01/26/2015   Procedure: TRANSURETHRAL RESECTION OF THE PROSTATE WITH GYRUS INSTRUMENTS;  Surgeon: Cleon Gustin, MD;  [facility];  Service: Urology;  Laterality: N/A;       Family History  Problem Relation Age of Onset  . Asthma Mother   . Heart attack Father 1  . Diabetes Brother     Social History   Tobacco Use  . Smoking status: Former Smoker    Packs/day: 1.00    Years: 15.00    Pack years: 15.00    Types: Cigarettes    Quit date: 01/11/1971    Years since quitting: 49.4  . Smokeless tobacco: Never Used  Vaping Use  . Vaping Use: Never used  Substance Use Topics  . Alcohol use: Not Currently    Comment: occ.  . Drug use: No    Home Medications Prior to Admission medications   Medication Sig Start Date End Date Taking? Authorizing Provider  acetaminophen (TYLENOL) 325 MG tablet Take 1-2 tablets (325-650 mg total) by mouth every 4 (four) hours as needed for mild pain. 05/13/20   Shirley Friar, PA-C  aspirin EC 81 MG tablet Take 1 tablet (81 mg total) by mouth daily. 03/31/18   Rehman, Mechele Dawley, MD  atorvastatin (LIPITOR) 80 MG tablet Take 80 mg by mouth at bedtime.      [provider]  carbamazepine (TEGRETOL) 200 MG tablet Take 400 mg by mouth 2 (two) times daily.     [provider]  divalproex (DEPAKOTE ER) 250 MG 24 hr  tablet Take 250 mg by mouth in the morning and at bedtime. 03/12/18   [provider]  famotidine (PEPCID) 40 MG tablet Take 1 tablet PO at bedtime. Due for yearly office visit for refills. Patient taking differently: Take 40 mg by mouth daily. 01/31/20   Harvel Quale, MD  furosemide (LASIX) 40 MG tablet Take 40 mg by mouth daily. May take additional tablet for 3 lbs wt gain over night    [provider]  lisinopril (ZESTRIL) 5 MG tablet TAKE (1) TABLET BY MOUTH ONCE DAILY. Patient taking differently: Take 5 mg by mouth daily. 07/24/19   [name], MD  LORazepam (ATIVAN) 0.5 MG tablet Take 0.5 mg by mouth at bedtime.    [provider]  Psyllium 0.36 g CAPS Take 2 capsules by mouth daily.    [provider]  tamsulosin (FLOMAX) 0.4 MG CAPS capsule TAKE (  1) CAPSULE BY MOUTH AT BEDTIME. Patient taking differently: Take 0.4 mg by mouth at bedtime. 04/01/20   McKenzie, Candee Furbish, MD    Allergies    Patient has no known allergies.  Review of Systems   Review of Systems  All other systems reviewed and are negative.   Physical Exam Updated Vital Signs BP (!) 161/79 (BP Location: Right Arm)   Pulse 69   Temp 97.9 F (36.6 C) (Oral)   Resp 18   SpO2 100%   Physical Exam Vitals and nursing note reviewed.   80 year old male, resting comfortably and in no acute distress. Vital signs are significant for elevated blood pressure. Oxygen saturation is 100%, which is normal. Head is normocephalic and atraumatic. PERRLA, EOMI. Oropharynx is clear. Neck is nontender and supple without adenopathy or JVD. Back is nontender and there is no CVA tenderness. Lungs are clear without rales, wheezes, or rhonchi. Chest: Resolving ecchymosis across the anterior chest.  Pacemaker present in the left subclavian area.  Incision has mild to moderate erythema but no active bleeding or drainage.  There is slight puffiness of the pacemaker pocket but no tenderness  to palpation. Heart has regular rate and rhythm without murmur. Abdomen is soft, flat, nontender without masses or hepatosplenomegaly and peristalsis is normoactive. Extremities have no cyanosis or edema, full range of motion is present. Skin is warm and dry without rash. Neurologic: Mental status is normal, cranial nerves are intact, there are no motor or sensory deficits.     ED Results / Procedures / Treatments    Procedures Procedures   Medications Ordered in ED Medications  cephALEXin (KEFLEX) capsule 500 mg (has no administration in time range)    ED Course  I have reviewed the triage vital signs and the nursing notes.  MDM Rules/Calculators/A&P                         Bleeding from pacemaker insertion site.  Patient brought the shirt with blood on it and it actually is more of a serosanguineous drainage than pure blood.  Overall picture is concerning for possible low-grade wound infection.  He is given a prescription for cephalexin and is referred back to his cardiologist for reevaluation in the next several days.  Old records are reviewed confirming recent hospitalization with pacemaker insertion on 5/3.  Final Clinical Impression(s) / ED Diagnoses Final diagnoses:  Bleeding from wound  Elevated blood pressure reading with diagnosis of hypertension  History of permanent cardiac pacemaker placement    Rx / DC Orders ED Discharge Orders         Ordered    cephALEXin (KEFLEX) 500 MG capsule  3 times daily        05/30/20 9470           Delora Fuel, MD 96/[birth year]/36 [phone]

## 2020-05-30 NOTE — Discharge Instructions (Signed)
Please call your cardiologist on Monday to schedule a follow-up appointment as soon as possible.  Return to the emergency department if you start running a fever or if the redness around the pacemaker seems to be getting worse.

## 2020-05-30 NOTE — ED Triage Notes (Signed)
Pacemaker insertion site has been bleeding off and on since insertion on May 3, no bleeding at this time. No signs of infection.

## 2020-06-01 ENCOUNTER — Telehealth: Payer: Self-pay | Admitting: Internal Medicine

## 2020-06-01 NOTE — Telephone Encounter (Signed)
New Messge:       Pt said he had his bandage changed in the office on 05-26-20, since that time he have had some bleeding.   On Saturday morning. He went to [facility] on Sat morning  And he was told And told it look like it was beginning to be infected.d. They told him to see his doctor this week.for this.

## 2020-06-01 NOTE — Telephone Encounter (Signed)
Patient reports on 05/29/20 he woke up with blood on shirt from ppm site. Went to the ED and was told concerned for possible infection. Patient was given Keflex 500 mg TID x1 week. Denies redness, fever or chills. Reports he had some swelling over the weekend but now is improving. States the ED nurse put 2 band aids on it in the ED and has not had any further bleeding. Device clinic apt made 06/04/20 @ 4:30. Location, date and time discussed with patient. Patient education provided on wound care and to call device clinic if further bleeding, drainage, fever or chills occurs. Patient agreeable and verbalized understanding.

## 2020-06-04 ENCOUNTER — Ambulatory Visit (INDEPENDENT_AMBULATORY_CARE_PROVIDER_SITE_OTHER): Payer: PPO | Admitting: Emergency Medicine

## 2020-06-04 ENCOUNTER — Other Ambulatory Visit: Payer: Self-pay

## 2020-06-04 DIAGNOSIS — I442 Atrioventricular block, complete: Secondary | ICD-10-CM

## 2020-06-04 NOTE — Progress Notes (Signed)
Follow up wound check today after ED visit 05/30/20 for bleeding/drainage/hematoma (see ED note). Band aids removed with dried saturated blood noted. No active bleeding from site and wound edges closed and approximated at today's visit. Moderate hematoma present. No drainage noted. Patient denies fever or chills. Images reviewed by Dr. Rayann Heman with no new orders received. Patient instructed to continue his antibiotics as prescribed. He is encouraged to contact device clinic if additional bleed or changes in wound site occur. If changes in wound status or fever/chills/symptoms of infection occur after hours patient will present to local emergency room or urgent care.

## 2020-06-04 NOTE — Patient Instructions (Addendum)
Continue your antibiotics (Keflex 500mg ) 3 x day until gone.  If you experience additional bleeding, fever, chills, or symptoms of an infection please call device clinic during clinic hours 8-5 M-F at [phone]. If symptoms occur after hours please go to your local emergency room.  Please do not get your pacemaker site wet until all bleeding has resolved. Continue to follow instructions provided to you at your last wound check appointment.

## 2020-06-29 DIAGNOSIS — F3110 Bipolar disorder, current episode manic without psychotic features, unspecified: Secondary | ICD-10-CM | POA: Diagnosis not present

## 2020-07-01 ENCOUNTER — Other Ambulatory Visit (INDEPENDENT_AMBULATORY_CARE_PROVIDER_SITE_OTHER): Payer: Self-pay | Admitting: Internal Medicine

## 2020-07-02 ENCOUNTER — Other Ambulatory Visit: Payer: Self-pay | Admitting: Urology

## 2020-07-16 ENCOUNTER — Other Ambulatory Visit: Payer: Self-pay | Admitting: Cardiology

## 2020-07-20 ENCOUNTER — Telehealth: Payer: Self-pay

## 2020-07-20 NOTE — Telephone Encounter (Signed)
Left Voice Mail:  Needing to speak with someone about refill on Rx.  Please advise.  Call back:  [phone]  Thanks, Helene Kelp

## 2020-07-21 DIAGNOSIS — H0102B Squamous blepharitis left eye, upper and lower eyelids: Secondary | ICD-10-CM | POA: Diagnosis not present

## 2020-07-21 DIAGNOSIS — H04123 Dry eye syndrome of bilateral lacrimal glands: Secondary | ICD-10-CM | POA: Diagnosis not present

## 2020-07-21 DIAGNOSIS — H0102A Squamous blepharitis right eye, upper and lower eyelids: Secondary | ICD-10-CM | POA: Diagnosis not present

## 2020-07-21 DIAGNOSIS — H5213 Myopia, bilateral: Secondary | ICD-10-CM | POA: Diagnosis not present

## 2020-07-22 NOTE — Telephone Encounter (Signed)
Left message to return call to office.

## 2020-07-23 DIAGNOSIS — H0102A Squamous blepharitis right eye, upper and lower eyelids: Secondary | ICD-10-CM | POA: Diagnosis not present

## 2020-07-23 DIAGNOSIS — H0102B Squamous blepharitis left eye, upper and lower eyelids: Secondary | ICD-10-CM | POA: Diagnosis not present

## 2020-07-23 DIAGNOSIS — H04123 Dry eye syndrome of bilateral lacrimal glands: Secondary | ICD-10-CM | POA: Diagnosis not present

## 2020-07-29 ENCOUNTER — Encounter: Payer: Self-pay | Admitting: Emergency Medicine

## 2020-07-29 ENCOUNTER — Ambulatory Visit
Admission: EM | Admit: 2020-07-29 | Discharge: 2020-07-29 | Disposition: A | Discharge status: Home / Self Care | Payer: PPO | Attending: Family Medicine | Admitting: Family Medicine

## 2020-07-29 ENCOUNTER — Other Ambulatory Visit: Payer: Self-pay

## 2020-07-29 DIAGNOSIS — J069 Acute upper respiratory infection, unspecified: Secondary | ICD-10-CM | POA: Diagnosis not present

## 2020-07-29 DIAGNOSIS — Z7689 Persons encountering health services in other specified circumstances: Secondary | ICD-10-CM | POA: Diagnosis not present

## 2020-07-29 NOTE — Discharge Instructions (Addendum)
You have been tested for COVID-19 today. °If your test returns positive, you will receive a phone call from [facility] regarding your results. °Negative test results are not called. °Both positive and negative results area always visible on MyChart. °If you do not have a MyChart account, sign up instructions are provided in your discharge papers. °Please do not hesitate to contact us should you have questions or concerns. ° °

## 2020-07-29 NOTE — ED Provider Notes (Signed)
Grant Stevens   [id number] 07/29/20 Arrival Time: 1016  ASSESSMENT & PLAN:  1. Viral URI with cough    Discussed typical duration of viral illnesses. COVID-19 testing sent. OTC symptom care as needed. Overall reports feeling better today.   Follow-up Information     Grant Noble, MD.   Specialty: Internal Medicine Why: As needed. Contact information: [address] [place] [address] [facility].   Specialty: Urgent Care Why: If worsening or failing to improve as anticipated. Contact information: [address], [place] [address] [phone]                Reviewed expectations re: course of current medical issues. Questions answered. Outlined signs and symptoms indicating need for more acute intervention. Understanding verbalized. After Visit Summary given.   SUBJECTIVE: History from: patient. Grant Stevens is a 80 y.o. male who presents with worries regarding COVID-19. Known COVID-19 contact: none. Recent travel: none. Reports: nasal congestion and mild cough; past 2 d; better today. Denies: fever and difficulty breathing. Normal PO intake without n/v/d.   OBJECTIVE:  Vitals:   07/29/20 1056  BP: (!) 96/59  Pulse: 63  Resp: 16  Temp: 98.6 F (37 C)  TempSrc: Oral  SpO2: 96%    General appearance: alert; no distress Eyes: PERRLA; EOMI; conjunctiva normal HENT: [place]; AT; with nasal congestion Neck: supple  Lungs: speaks full sentences without difficulty; unlabored; CTAB Extremities: no edema Skin: warm and dry Neurologic: normal gait Psychological: alert and cooperative; normal mood and affect  Labs:  Labs Reviewed  NOVEL CORONAVIRUS, NAA    No Known Allergies  Past Medical History:  Diagnosis Date   Allergic rhinitis    Arthritis    Asthma    Childhood   Atrial fibrillation ([facility])    Remote history - WARCEF   Bladder cancer  ([facility])    BPH (benign prostatic hyperplasia)    Cardiomyopathy ([facility])    CHF (congestive heart failure) ([facility])    Coronary atherosclerosis of native coronary artery    Multivessel s/p CABG in 06/2001 post-IMI, LVEF 35% up to 50% postoperatively;   Depression    Difficulty sleeping    Erectile dysfunction    Essential hypertension    Frequency of urination    History of bipolar disorder    Hyperlipidemia    IBS (irritable bowel syndrome)    Myocardial infarction ([place]) 2002   Social History   Socioeconomic History   Marital status: Widowed    Spouse name: Not on file   Number of children: Not on file   Years of education: Not on file   Highest education level: Not on file  Occupational History   Not on file  Tobacco Use   Smoking status: Former    Packs/day: 1.00    Years: 15.00    Pack years: 15.00    Types: Cigarettes    Quit date: 01/11/1971    Years since quitting: 49.5   Smokeless tobacco: Never  Vaping Use   Vaping Use: Never used  Substance and Sexual Activity   Alcohol use: Not Currently    Comment: occ.   Drug use: No   Sexual activity: Not on file  Other Topics Concern   Not on file  Social History Narrative   Married with no children    Exercises 4 times weekly   Caffeine once a day   Social  Determinants of Health   Financial Resource Strain: Not on file  Food Insecurity: Not on file  Transportation Needs: Not on file  Physical Activity: Not on file  Stress: Not on file  Social Connections: Not on file  Intimate Partner Violence: Not on file   Family History  Problem Relation Age of Onset   Asthma Mother    Heart attack Father 16   Diabetes Brother    Past Surgical History:  Procedure Laterality Date   BIOPSY  03/28/2018   Procedure: BIOPSY;  Surgeon: Rogene Houston, MD;  [facility];  Service: Endoscopy;;  esophagus   BIV PACEMAKER INSERTION CRT-P N/A 05/12/2020   Procedure: BIV PACEMAKER INSERTION CRT-P;  Surgeon: Thompson Grayer,  MD;  Location: [place] CV LAB;  Service: Cardiovascular;  Laterality: N/A;   CATARACT EXTRACTION W/PHACO Left 12/29/2014   Procedure: CATARACT EXTRACTION PHACO AND INTRAOCULAR LENS PLACEMENT (IOC);  Surgeon: Williams Che, MD;  Location: [facility];  Service: Ophthalmology;  Laterality: Left;  CDE:3.71   CATARACT EXTRACTION W/PHACO Right 03/30/2015   Procedure: CATARACT EXTRACTION PHACO AND INTRAOCULAR LENS PLACEMENT RIGHT EYE CDE=2.56;  Surgeon: Williams Che, MD;  Location: [facility];  Service: Ophthalmology;  Laterality: Right;   CIRCUMCISION  10/2003   COLONOSCOPY N/A 07/11/2012   Procedure: COLONOSCOPY;  Surgeon: Rogene Houston, MD;  [facility];  Service: Endoscopy;  Laterality: N/A;  830-moved to 60 Ann to notify pt   CORONARY ARTERY BYPASS GRAFT  08/2000   Dr. Servando Snare - LIMA to LAD, SVG to diagonal, SVG to PDA TRIPLE BYPASS   CYSTOSCOPY W/ RETROGRADES Bilateral 03/02/2015   Procedure: CYSTOSCOPY WITH RIGHT RETROGRADE PYELOGRAM,  ATTEMPTED LEFT RETROGRADE PYELOGRAM;  Surgeon: Cleon Gustin, MD;  [facility];  Service: Urology;  Laterality: Bilateral;   ESOPHAGEAL DILATION N/A 03/28/2018   Procedure: ESOPHAGEAL DILATION;  Surgeon: Rogene Houston, MD;  [facility];  Service: Endoscopy;  Laterality: N/A;   ESOPHAGOGASTRODUODENOSCOPY N/A 03/28/2018   Procedure: ESOPHAGOGASTRODUODENOSCOPY (EGD);  Surgeon: Rogene Houston, MD;  [facility];  Service: Endoscopy;  Laterality: N/A;  120   TEMPORARY PACEMAKER N/A 05/12/2020   Procedure: TEMPORARY PACEMAKER;  Surgeon: Martinique, Peter M, MD;  Location: [place] CV LAB;  Service: Cardiovascular;  Laterality: N/A;   TONSILLECTOMY     TRANSURETHRAL RESECTION OF BLADDER TUMOR N/A 01/26/2015   Procedure: TRANSURETHRAL RESECTION OF BLADDER TUMOR (TURBT);  Surgeon: Cleon Gustin, MD;  [facility];  Service: Urology;  Laterality: N/A;   TRANSURETHRAL RESECTION OF BLADDER TUMOR WITH GYRUS (TURBT-GYRUS)  N/A 03/02/2015   Procedure: TRANSURETHRAL RESECTION OF BLADDER TUMOR WITH GYRUS (TURBT-GYRUS);  Surgeon: Cleon Gustin, MD;  [facility];  Service: Urology;  Laterality: N/A;   TRANSURETHRAL RESECTION OF PROSTATE N/A 01/26/2015   Procedure: TRANSURETHRAL RESECTION OF THE PROSTATE WITH GYRUS INSTRUMENTS;  Surgeon: Cleon Gustin, MD;  [facility];  Service: Urology;  Laterality: N/AVanessa Kick, MD 07/29/20 1256

## 2020-07-29 NOTE — ED Triage Notes (Signed)
Congestion and sneezing since Sunday.

## 2020-07-30 DIAGNOSIS — E871 Hypo-osmolality and hyponatremia: Secondary | ICD-10-CM | POA: Diagnosis not present

## 2020-07-30 DIAGNOSIS — F3181 Bipolar II disorder: Secondary | ICD-10-CM | POA: Diagnosis not present

## 2020-07-30 DIAGNOSIS — I5022 Chronic systolic (congestive) heart failure: Secondary | ICD-10-CM | POA: Diagnosis not present

## 2020-07-30 LAB — NOVEL CORONAVIRUS, NAA: SARS-CoV-2, NAA: DETECTED — AB

## 2020-07-30 LAB — SARS-COV-2, NAA 2 DAY TAT

## 2020-08-05 DIAGNOSIS — I5022 Chronic systolic (congestive) heart failure: Secondary | ICD-10-CM | POA: Diagnosis not present

## 2020-08-05 DIAGNOSIS — U071 COVID-19: Secondary | ICD-10-CM | POA: Diagnosis not present

## 2020-08-05 DIAGNOSIS — E871 Hypo-osmolality and hyponatremia: Secondary | ICD-10-CM | POA: Diagnosis not present

## 2020-08-05 DIAGNOSIS — Q246 Congenital heart block: Secondary | ICD-10-CM | POA: Diagnosis not present

## 2020-08-12 ENCOUNTER — Telehealth: Payer: Self-pay | Admitting: Internal Medicine

## 2020-08-12 ENCOUNTER — Ambulatory Visit (INDEPENDENT_AMBULATORY_CARE_PROVIDER_SITE_OTHER): Payer: PPO

## 2020-08-12 DIAGNOSIS — I442 Atrioventricular block, complete: Secondary | ICD-10-CM | POA: Diagnosis not present

## 2020-08-12 LAB — CUP PACEART REMOTE DEVICE CHECK
Battery Remaining Longevity: 97 mo
Battery Remaining Percentage: 95.5 %
Battery Voltage: 3.01 V
Brady Statistic AP VP Percent: 25 %
Brady Statistic AP VS Percent: 1 %
Brady Statistic AS VP Percent: 74 %
Brady Statistic AS VS Percent: 1 %
Brady Statistic RA Percent Paced: 25 %
Brady Statistic RV Percent Paced: 99 %
Date Time Interrogation Session: 20220803040014
Implantable Lead Implant Date: 20220503
Implantable Lead Implant Date: 20220503
Implantable Lead Location: 753859
Implantable Lead Location: 753860
Implantable Pulse Generator Implant Date: 20220503
Lead Channel Impedance Value: 460 Ohm
Lead Channel Impedance Value: 510 Ohm
Lead Channel Pacing Threshold Amplitude: 0.625 V
Lead Channel Pacing Threshold Amplitude: 0.75 V
Lead Channel Pacing Threshold Pulse Width: 0.5 ms
Lead Channel Pacing Threshold Pulse Width: 0.5 ms
Lead Channel Sensing Intrinsic Amplitude: 2 mV
Lead Channel Sensing Intrinsic Amplitude: 8.2 mV
Lead Channel Setting Pacing Amplitude: 0.875
Lead Channel Setting Pacing Amplitude: 3.5 V
Lead Channel Setting Pacing Pulse Width: 0.5 ms
Lead Channel Setting Sensing Sensitivity: 2 mV
Pulse Gen Model: 2272
Pulse Gen Serial Number: 3920512

## 2020-08-12 NOTE — Telephone Encounter (Signed)
  1. Has your device fired?   2. Is you device beeping?   3. Are you experiencing draining or swelling at device site?   4. Are you calling to see if we received your device transmission? yes  5. Have you passed out? no    Please route to [place]

## 2020-08-12 NOTE — Telephone Encounter (Signed)
I let the patient know we did received his transmission.

## 2020-08-17 ENCOUNTER — Encounter (INDEPENDENT_AMBULATORY_CARE_PROVIDER_SITE_OTHER): Payer: Self-pay | Admitting: Gastroenterology

## 2020-08-17 ENCOUNTER — Ambulatory Visit (INDEPENDENT_AMBULATORY_CARE_PROVIDER_SITE_OTHER): Payer: PPO | Admitting: Gastroenterology

## 2020-08-17 ENCOUNTER — Ambulatory Visit (HOSPITAL_COMMUNITY)
Admission: RE | Admit: 2020-08-17 | Discharge: 2020-08-17 | Disposition: A | Discharge status: Home / Self Care | Payer: PPO | Source: Ambulatory Visit | Attending: Gastroenterology | Admitting: Gastroenterology

## 2020-08-17 ENCOUNTER — Other Ambulatory Visit: Payer: Self-pay

## 2020-08-17 VITALS — BP 136/79 | HR 78 | Temp 97.7°F | Ht 72.0 in | Wt 191.0 lb

## 2020-08-17 DIAGNOSIS — R197 Diarrhea, unspecified: Secondary | ICD-10-CM | POA: Insufficient documentation

## 2020-08-17 DIAGNOSIS — K2270 Barrett's esophagus without dysplasia: Secondary | ICD-10-CM

## 2020-08-17 DIAGNOSIS — K219 Gastro-esophageal reflux disease without esophagitis: Secondary | ICD-10-CM

## 2020-08-17 NOTE — Progress Notes (Addendum)
Referring Provider: [name], MD Primary Care Physician:  [name], MD Primary GI Physician: Jenetta Downer  Chief Complaint  Patient presents with   Constipation    Pt states he has had some epidsodes of diarrhea. Trouble making it to the bathroom at times.    HPI:   Grant Stevens is a 80 y.o. male with pmh of localized bladder cancer s/p BCG injection, MI s/p CABG, asthma, BPH, cardiomyopathy, Heart failure, HTN, bipolar disorder, HLD, Barrett's esophagus, and IBS who presents with symptoms of intermittent diarrhea.  Diarrhea: states that he had an episode of viral gastroenteritis in May that spontaneously resolved. Reports he began having diarrhea again, unable to pinpoint exact start date. States that he sometimes has to rush to the restroom so that he does not have an accident, however, upon arriving to the restroom, only a small amount of stool comes out. Diarrhea typically occurs not long after eating. Has had some nocturnal fecal incontinence as well. Reports around 2-3 BMs per week. He does endorse some solid BMs throughout the week with mostly intermittent episodes of diarrhea. He denies any constipation or needing to strain to move his bowels. Reports he is taking metamucil every morning. Has not tried any other fiber supplements or laxatives in the past.   Barrett's Esophagus: denies any issues with with reflux, dysphagia or odynophagia. Currently on Pepcid '40mg'$  nightly.   No red flag symptoms. Patient denies melena, hematochezia, nausea, vomiting, constipation, dysphagia, odyonophagia, early satiety. Reports a small amount of weight loss over the past few months (6 pounds since last visit). States he is exercising and also had a cardiac pacemaker placed in May.   Last Colonoscopy:05/09/12 transverse colon polyp (benign lymphoid); tortuous colon; prep adequate with much suction/lavage; hemorrhoids.   Last Endoscopy:03/28/18 Abnormal esophageal motility. - Mild Schatzki ring at  GEJ(39). dilated - Patch of salmon colored mucosa at distal esophagus. Barrett's esophagus.  - 2 cm hiatal hernia. - Erosive gastropathy. - Normal pylorus. - Duodenal erosions without bleeding. - Normal second portion of the duodenum.  Recommendations:  Repeat surveillance EGD in 2023 years from date of last EGD, depending on patient's overall health. Patient due for repeat screening colonoscopy in 2024, however, due to patient's advanced age and benign history, repeat screening not required unless clinically indicated.  Past Medical History:  Diagnosis Date   Allergic rhinitis    Arthritis    Asthma    Childhood   Atrial fibrillation ([place])    Remote history - WARCEF   Bladder cancer (HCC)    BPH (benign prostatic hyperplasia)    Cardiomyopathy (HCC)    CHF (congestive heart failure) (HCC)    Coronary atherosclerosis of native coronary artery    Multivessel s/p CABG in 06/2001 post-IMI, LVEF 35% up to 50% postoperatively;   Depression    Difficulty sleeping    Erectile dysfunction    Essential hypertension    Frequency of urination    History of bipolar disorder    Hyperlipidemia    IBS (irritable bowel syndrome)    Myocardial infarction ([place]) 2002    Past Surgical History:  Procedure Laterality Date   BIOPSY  03/28/2018   Procedure: BIOPSY;  Surgeon: Rogene Houston, MD;  [facility];  Service: Endoscopy;;  esophagus   BIV PACEMAKER INSERTION CRT-P N/A 05/12/2020   Procedure: BIV PACEMAKER INSERTION CRT-P;  Surgeon: Thompson Grayer, MD;  Location: [place] [facility];  Service: Cardiovascular;  Laterality: N/A;   CATARACT EXTRACTION  W/PHACO Left 12/29/2014   Procedure: CATARACT EXTRACTION PHACO AND INTRAOCULAR LENS PLACEMENT (IOC);  Surgeon: Williams Che, MD;  Location: [facility];  Service: Ophthalmology;  Laterality: Left;  CDE:3.71   CATARACT EXTRACTION W/PHACO Right 03/30/2015   Procedure: CATARACT EXTRACTION PHACO AND INTRAOCULAR LENS PLACEMENT RIGHT EYE  CDE=2.56;  Surgeon: Williams Che, MD;  Location: [facility];  Service: Ophthalmology;  Laterality: Right;   CIRCUMCISION  10/2003   COLONOSCOPY N/A 07/11/2012   Procedure: COLONOSCOPY;  Surgeon: Rogene Houston, MD;  [facility];  Service: Endoscopy;  Laterality: N/A;  830-moved to 40 Ann to notify pt   CORONARY ARTERY BYPASS GRAFT  08/2000   Dr. Servando Snare - LIMA to LAD, SVG to diagonal, SVG to PDA TRIPLE BYPASS   CYSTOSCOPY W/ RETROGRADES Bilateral 03/02/2015   Procedure: CYSTOSCOPY WITH RIGHT RETROGRADE PYELOGRAM,  ATTEMPTED LEFT RETROGRADE PYELOGRAM;  Surgeon: Cleon Gustin, MD;  [facility];  Service: Urology;  Laterality: Bilateral;   ESOPHAGEAL DILATION N/A 03/28/2018   Procedure: ESOPHAGEAL DILATION;  Surgeon: Rogene Houston, MD;  [facility];  Service: Endoscopy;  Laterality: N/A;   ESOPHAGOGASTRODUODENOSCOPY N/A 03/28/2018   Procedure: ESOPHAGOGASTRODUODENOSCOPY (EGD);  Surgeon: Rogene Houston, MD;  [facility];  Service: Endoscopy;  Laterality: N/A;  120   TEMPORARY PACEMAKER N/A 05/12/2020   Procedure: TEMPORARY PACEMAKER;  Surgeon: Martinique, Peter M, MD;  Location: [place] [facility];  Service: Cardiovascular;  Laterality: N/A;   TONSILLECTOMY     TRANSURETHRAL RESECTION OF BLADDER TUMOR N/A 01/26/2015   Procedure: TRANSURETHRAL RESECTION OF BLADDER TUMOR (TURBT);  Surgeon: Cleon Gustin, MD;  [facility];  Service: Urology;  Laterality: N/A;   TRANSURETHRAL RESECTION OF BLADDER TUMOR WITH GYRUS (TURBT-GYRUS) N/A 03/02/2015   Procedure: TRANSURETHRAL RESECTION OF BLADDER TUMOR WITH GYRUS (TURBT-GYRUS);  Surgeon: Cleon Gustin, MD;  [facility];  Service: Urology;  Laterality: N/A;   TRANSURETHRAL RESECTION OF PROSTATE N/A 01/26/2015   Procedure: TRANSURETHRAL RESECTION OF THE PROSTATE WITH GYRUS INSTRUMENTS;  Surgeon: Cleon Gustin, MD;  [facility];  Service: Urology;  Laterality: N/A;    Current Outpatient  Medications  Medication Sig Dispense Refill   aspirin EC 81 MG tablet Take 1 tablet (81 mg total) by mouth daily.     atorvastatin (LIPITOR) 80 MG tablet Take 80 mg by mouth at bedtime.       carbamazepine (TEGRETOL) 200 MG tablet Take 400 mg by mouth 2 (two) times daily.      divalproex (DEPAKOTE ER) 250 MG 24 hr tablet Take 250 mg by mouth in the morning and at bedtime.     famotidine (PEPCID) 40 MG tablet TAKE (1) TABLET BY MOUTH AT BEDTIME. 30 tablet 5   lisinopril (ZESTRIL) 5 MG tablet TAKE (1) TABLET BY MOUTH ONCE DAILY. 60 tablet 0   LORazepam (ATIVAN) 0.5 MG tablet Take 0.5 mg by mouth at bedtime.     Psyllium 0.36 g CAPS Take 2 capsules by mouth daily.     tamsulosin (FLOMAX) 0.4 MG CAPS capsule Take 1 capsule (0.4 mg total) by mouth at bedtime. 90 capsule 3   No current facility-administered medications for this visit.    Allergies as of 08/17/2020   (No Known Allergies)    Family History  Problem Relation Age of Onset   Asthma Mother    Heart attack Father 3   Diabetes Brother     Social History   Socioeconomic History  Marital status: Widowed    Spouse name: Not on file   Number of children: Not on file   Years of education: Not on file   Highest education level: Not on file  Occupational History   Not on file  Tobacco Use   Smoking status: Former    Packs/day: 1.00    Years: 15.00    Pack years: 15.00    Types: Cigarettes    Quit date: 01/11/1971    Years since quitting: 49.6   Smokeless tobacco: Never  Vaping Use   Vaping Use: Never used  Substance and Sexual Activity   Alcohol use: Not Currently    Comment: occ.   Drug use: No   Sexual activity: Not on file  Other Topics Concern   Not on file  Social History Narrative   Married with no children    Exercises 4 times weekly   Caffeine once a day   Social Determinants of Health   Financial Resource Strain: Not on file  Food Insecurity: Not on file  Transportation Needs: Not on file  Physical  Activity: Not on file  Stress: Not on file  Social Connections: Not on file   Review of Systems: Gen: Denies fever, chills, anorexia. Denies fatigue, weakness. CV: Denies chest pain, palpitations, syncope, peripheral edema, and claudication. Resp: Denies dyspnea at rest, cough, wheezing, coughing up blood, and pleurisy. GI: Denies vomiting blood, jaundice, and fecal incontinence. Denies dysphagia or odynophagia. Denies reflux. Intermittent diarrheal episodes. Derm: Denies rash, itching, dry skin Psych: Denies depression, anxiety, memory loss, confusion. No homicidal or suicidal ideation.  Heme: Denies bruising, bleeding, and enlarged lymph nodes.  Physical Exam: BP 136/79 (BP Location: Right Arm, Patient Position: Sitting, Cuff Size: Large)   Pulse 78   Temp 97.7 F (36.5 C) (Oral)   Ht 6' (1.829 m)   Wt 191 lb (86.6 kg)   BMI 25.90 kg/m  General:   Alert and oriented. No distress noted. Pleasant and cooperative.  Head:  Normocephalic and atraumatic. Eyes:  Conjuctiva clear without scleral icterus. Mouth:  Oral mucosa pink and moist. Good dentition. No lesions. Heart: Normal rate and rhythm, s1 and s2 heart sounds present.  Lungs: Clear lung sounds in all lobes. Respirations equal and unlabored. Abdomen:  +BS, soft, non-tender and non-distended. No rebound or guarding. No HSM or masses noted. Derm: No palmar erythema or jaundice Msk:  Symmetrical without gross deformities. Normal posture. Extremities:  Without edema. Neurologic:  Alert and  oriented x4 Psych:  Alert and cooperative. Normal mood and affect.  ASSESSMENT: Grant Stevens is a 80 y.o. male presenting today for follow up of Barrett's esophagus and new reports of intermittent diarrhea.   Patient reports that he had gastroenteritis back in may 2022, symptoms resolved spontaneously, however, recently, he began having intermittent episodes of small amounts of diarrhea with associated fecal incontinence to include  nocturnal fecal incontinence. He takes metamucil daily and reports he does not feel constipated, however, he is only having 2-3 normal, formed BMs per week. I suspect he may be experiencing some overflow diarrhea from nefficient elimination of stools due to lack of sufficient BMs. We will obtain an abdominal xray to evaluate for colonic stool burden. Patient may continue metamucil daily. If stool burden present we will likely start with a bowel prep to clean bowels out then begin patient on miralax regimen to keep bowels moving regularly and efficiently.   Patient found to have Barrett's esophagus on EGD in 2020. He denies  any episodes of reflux, dysphagia or odynophagia. He is currently on pepcid 40 mg nightly. We will continue with current H2 therapy as long as reflux is controlled. Should consider switching to PPI therapy if reflux symptoms arise. Plan for repeat surveillance EGD in 2023.   Patient has had approximately 6 pound weight loss since last visit in may (191 today down from 197, patient reports he was a little over 200 pounds earlier this year), however, he reports a good appetite and frequent exercise. He denies any early satiety or issues with obtaining adequate nutrition. No red flags symptoms presents to suggest underlying etiology of weight loss. We will continue to monitor and patient advise to contact clinic if he begins to lose weight more rapidly, appetite changes, he develops early satiety, melena or rectal bleeding as we would need to proceed with further investigation of weight loss at that time.   PLAN:  plan repeat surveillance EGD for Barrett's esophagus in 2023 2. Will order abdominal xray today to evaluate for increased stool burden as etiology of intermittent diarrhea 3.continue metamucil daily 4. Continue pepcid '40mg'$  nightly, will switch to PPI therapy if reflux becomes uncontrolled 5. Will Potentially do initial bowel prep and miralax daily regimen to follow if xray shows  stool burden, further recommendations to follow upon results of xray   Follow Up: 6 months  Case discussed with Dr. Jenetta Downer who is in agreement with plan of care, as outlined above.   Grant Belshe L. Alver Sorrow, MSN, APRN, AGNP-C Adult-Gerontology Nurse Practitioner [facility]   Gastroenterology attending attestation note: Agree with the below findings as written. The patient was presented to me by the APP (Advanced Practice Provider) I personally reviewed the medical chart and evaluated the patient. I personally evaluated and examined the patient by myself.  I agree with Grant Stevens's H/P and assessment.  The patient has presented multiple episodes of small amount of watery bowel movements and some episodes of fecal incontinence.  However he states that he has regular bowel movements 2-3 times per week.  It is possible that he has some overflow diarrhea at the moment, will obtain an abdominal x-ray to evaluate this and if positive then he will need to take a bowel prep and MiraLAX.  For now he can continue taking the Metamucil every day.  In terms of his GERD, symptoms seem to be controlled with Pepcid 40 mg every night.  As long as he does not have any ongoing GERD symptoms, when questioning the disease controlled with an anti-H2 medication, but if he presented any new symptoms we will switch him to a PPI.  He will need to have repeat EGD in 1 year to make sure he does not have any active endoscopic inflammation.  [name], MD Gastroenterology and Hepatology [facility]  *Addendum: abdominal xray showed mild colonic stool burden indication constipation. Bowel prep sent to patient's pharmacy to be completed, miralax 1 capful daily to start thereafter, can increase dose to up to 3 capfuls daily to help with constipation and intermittent overflow diarrhea.

## 2020-08-17 NOTE — Patient Instructions (Signed)
We will order an xray of your abdomen, you can go to [facility] hospital to have this done after you leave our office. We will call you with the results and further recommendations.  Continue metamucil daily and Pepcid nightly.  Follow up 6 months.

## 2020-08-18 ENCOUNTER — Encounter: Payer: Self-pay | Admitting: Internal Medicine

## 2020-08-18 ENCOUNTER — Ambulatory Visit: Payer: PPO | Admitting: Internal Medicine

## 2020-08-18 VITALS — BP 116/64 | HR 77 | Ht 72.0 in | Wt 193.6 lb

## 2020-08-18 DIAGNOSIS — I442 Atrioventricular block, complete: Secondary | ICD-10-CM

## 2020-08-18 DIAGNOSIS — I10 Essential (primary) hypertension: Secondary | ICD-10-CM

## 2020-08-18 MED ORDER — PEG 3350-KCL-NA BICARB-NACL 420 G PO SOLR: 4000.0000 mL | Freq: Once | ORAL | 0 refills | Status: AC

## 2020-08-18 NOTE — Patient Instructions (Signed)
Medication Instructions:  Your physician recommends that you continue on your current medications as directed. Please refer to the Current Medication list given to you today.  *If you need a refill on your cardiac medications before your next appointment, please call your pharmacy*   Lab Work: NONE   If you have labs (blood work) drawn today and your tests are completely normal, you will receive your results only by: . MyChart Message (if you have MyChart) OR . A paper copy in the mail If you have any lab test that is abnormal or we need to change your treatment, we will call you to review the results.   Testing/Procedures: NONE    Follow-Up: At [facility], you and your health needs are our priority.  As part of our continuing mission to provide you with exceptional heart care, we have created designated Provider Care Teams.  These Care Teams include your primary Cardiologist (physician) and Advanced Practice Providers (APPs -  Physician Assistants and Nurse Practitioners) who all work together to provide you with the care you need, when you need it.  We recommend signing up for the patient portal called "MyChart".  Sign up information is provided on this After Visit Summary.  MyChart is used to connect with patients for Virtual Visits (Telemedicine).  Patients are able to view lab/test results, encounter notes, upcoming appointments, etc.  Non-urgent messages can be sent to your provider as well.   To learn more about what you can do with MyChart, go to [email or web address].    Your next appointment:   1 year(s)  The format for your next appointment:   In Person  Provider:   Gregg Taylor, MD   Other Instructions Thank you for choosing [facility]!    

## 2020-08-18 NOTE — Addendum Note (Signed)
Addended by: Gabriel Rung on: 08/18/2020 12:22 PM   Modules accepted: Orders

## 2020-08-18 NOTE — Progress Notes (Signed)
HPI [name] returns today for followup. He is a pleasant 80 yo man with a h/o high grade heart block who underwent DDD PM insertion. He could not implant an LV lead due to his antaomy. He has done well in the interim with no chest pain or sob. He is exercising with no limit. He denies peripheral edema.   No Known Allergies   Current Outpatient Medications  Medication Sig Dispense Refill   aspirin EC 81 MG tablet Take 1 tablet (81 mg total) by mouth daily.     atorvastatin (LIPITOR) 80 MG tablet Take 80 mg by mouth at bedtime.       carbamazepine (TEGRETOL) 200 MG tablet Take 400 mg by mouth 2 (two) times daily.      divalproex (DEPAKOTE ER) 250 MG 24 hr tablet Take 250 mg by mouth in the morning and at bedtime.     famotidine (PEPCID) 40 MG tablet TAKE (1) TABLET BY MOUTH AT BEDTIME. 30 tablet 5   furosemide (LASIX) 40 MG tablet Take 80 mg by mouth.     lisinopril (ZESTRIL) 5 MG tablet TAKE (1) TABLET BY MOUTH ONCE DAILY. 60 tablet 0   LORazepam (ATIVAN) 0.5 MG tablet Take 0.5 mg by mouth at bedtime.     polyethylene glycol-electrolytes (NULYTELY) 420 g solution Take 4,000 mLs by mouth once for 1 dose. Drink 1 glass every 10-15 minutes until half is completed, then finish the rest as before after 30 minutes. 4000 mL 0   Psyllium 0.36 g CAPS Take 2 capsules by mouth daily.     tamsulosin (FLOMAX) 0.4 MG CAPS capsule Take 1 capsule (0.4 mg total) by mouth at bedtime. 90 capsule 3   No current facility-administered medications for this visit.     Past Medical History:  Diagnosis Date   Allergic rhinitis    Arthritis    Asthma    Childhood   Atrial fibrillation ([place])    Remote history - WARCEF   Bladder cancer (HCC)    BPH (benign prostatic hyperplasia)    Cardiomyopathy (HCC)    CHF (congestive heart failure) (HCC)    Coronary atherosclerosis of native coronary artery    Multivessel s/p CABG in 06/2001 post-IMI, LVEF 35% up to 50% postoperatively;   Depression     Difficulty sleeping    Erectile dysfunction    Essential hypertension    Frequency of urination    History of bipolar disorder    Hyperlipidemia    IBS (irritable bowel syndrome)    Myocardial infarction ([place]) 2002    ROS:   All systems reviewed and negative except as noted in the HPI.   Past Surgical History:  Procedure Laterality Date   BIOPSY  03/28/2018   Procedure: BIOPSY;  Surgeon: Rogene Houston, MD;  [facility];  Service: Endoscopy;;  esophagus   BIV PACEMAKER INSERTION CRT-P N/A 05/12/2020   Procedure: BIV PACEMAKER INSERTION CRT-P;  Surgeon: Thompson Grayer, MD;  Location: [place] [facility];  Service: Cardiovascular;  Laterality: N/A;   CATARACT EXTRACTION W/PHACO Left 12/29/2014   Procedure: CATARACT EXTRACTION PHACO AND INTRAOCULAR LENS PLACEMENT (IOC);  Surgeon: Williams Che, MD;  [facility]: [facility];  Service: Ophthalmology;  Laterality: Left;  CDE:3.71   CATARACT EXTRACTION W/PHACO Right 03/30/2015   Procedure: CATARACT EXTRACTION PHACO AND INTRAOCULAR LENS PLACEMENT RIGHT EYE CDE=2.56;  Surgeon: Williams Che, MD;  [facility]: [facility];  Service: Ophthalmology;  Laterality: Right;   CIRCUMCISION  10/11/2003   COLONOSCOPY N/A 07/11/2012   rehman,transverse colon polyp (benign lymphoid); tortuous colon; prep adequate with much suction/lavage; hemorrhoids.   CORONARY ARTERY BYPASS GRAFT  08/10/2000   Dr. Servando Snare - LIMA to LAD, SVG to diagonal, SVG to PDA TRIPLE BYPASS   CYSTOSCOPY W/ RETROGRADES Bilateral 03/02/2015   Procedure: CYSTOSCOPY WITH RIGHT RETROGRADE PYELOGRAM,  ATTEMPTED LEFT RETROGRADE PYELOGRAM;  Surgeon: Cleon Gustin, MD;  [facility];  Service: Urology;  Laterality: Bilateral;   ESOPHAGEAL DILATION N/A 03/28/2018   Procedure: ESOPHAGEAL DILATION;  Surgeon: Rogene Houston, MD;  [facility];  Service: Endoscopy;  Laterality: N/A;   ESOPHAGOGASTRODUODENOSCOPY N/A 03/28/2018   rehman,Abnormal esophageal motility. mild  schatzki ring at GEJ, dilated, patch of salmon colored mucosa at distal esophagus. Barrett's esophagus. 2cm HH. erosive gastropathy, normal pylorus, duodenal erosions w/o bleeding. normal second portion of duodenum   TEMPORARY PACEMAKER N/A 05/12/2020   Procedure: TEMPORARY PACEMAKER;  Surgeon: Martinique, Peter M, MD;  Location: [place] [facility];  Service: Cardiovascular;  Laterality: N/A;   TONSILLECTOMY     TRANSURETHRAL RESECTION OF BLADDER TUMOR N/A 01/26/2015   Procedure: TRANSURETHRAL RESECTION OF BLADDER TUMOR (TURBT);  Surgeon: Cleon Gustin, MD;  [facility];  Service: Urology;  Laterality: N/A;   TRANSURETHRAL RESECTION OF BLADDER TUMOR WITH GYRUS (TURBT-GYRUS) N/A 03/02/2015   Procedure: TRANSURETHRAL RESECTION OF BLADDER TUMOR WITH GYRUS (TURBT-GYRUS);  Surgeon: Cleon Gustin, MD;  [facility];  Service: Urology;  Laterality: N/A;   TRANSURETHRAL RESECTION OF PROSTATE N/A 01/26/2015   Procedure: TRANSURETHRAL RESECTION OF THE PROSTATE WITH GYRUS INSTRUMENTS;  Surgeon: Cleon Gustin, MD;  [facility];  Service: Urology;  Laterality: N/A;     Family History  Problem Relation Age of Onset   Asthma Mother    Heart attack Father 19   Diabetes Brother      Social History   Socioeconomic History   Marital status: Widowed    Spouse name: Not on file   Number of children: Not on file   Years of education: Not on file   Highest education level: Not on file  Occupational History   Not on file  Tobacco Use   Smoking status: Former    Packs/day: 1.00    Years: 15.00    Pack years: 15.00    Types: Cigarettes    Quit date: 01/11/1971    Years since quitting: 49.6   Smokeless tobacco: Never  Vaping Use   Vaping Use: Never used  Substance and Sexual Activity   Alcohol use: Not Currently    Comment: occ.   Drug use: No   Sexual activity: Not on file  Other Topics Concern   Not on file  Social History Narrative   Married with no children     Exercises 4 times weekly   Caffeine once a day   Social Determinants of Health   Financial Resource Strain: Not on file  Food Insecurity: Not on file  Transportation Needs: Not on file  Physical Activity: Not on file  Stress: Not on file  Social Connections: Not on file  Intimate Partner Violence: Not on file     BP 116/64   Pulse 77   Ht 6' (1.829 m)   Wt 193 lb 9.6 oz (87.8 kg)   SpO2 94%   BMI 26.26 kg/m   Physical Exam:  Well appearing NAD HEENT: Unremarkable Neck:  No JVD, no thyromegally Lymphatics:  No adenopathy Back:  No  CVA tenderness Lungs:  Clear with no wheezes HEART:  Regular rate rhythm, no murmurs, no rubs, no clicks Abd:  soft, positive bowel sounds, no organomegally, no rebound, no guarding Ext:  2 plus pulses, no edema, no cyanosis, no clubbing Skin:  No rashes no nodules Neuro:  CN II through XII intact, motor grossly intact  EKG - nsr with ventricular pacing  DEVICE  Normal device function.  See PaceArt for details.   Assess/Plan:  Heart block - he is conducting today and I have reprogrammed his device to allow for intrinsic conduction. PPM - we reprogrammed his device to DDD with a long AV delay allowing him for intrinsic conduction.  HTN - his bp is well controlled. No change in meds. Chronic systolic heart failure - his symptoms are class 2A. We will follow.  Carleene Overlie Kimmi Acocella,MD

## 2020-08-19 DIAGNOSIS — I442 Atrioventricular block, complete: Secondary | ICD-10-CM | POA: Diagnosis not present

## 2020-08-19 DIAGNOSIS — E871 Hypo-osmolality and hyponatremia: Secondary | ICD-10-CM | POA: Diagnosis not present

## 2020-08-19 DIAGNOSIS — I5022 Chronic systolic (congestive) heart failure: Secondary | ICD-10-CM | POA: Diagnosis not present

## 2020-08-21 ENCOUNTER — Encounter: Payer: Self-pay | Admitting: Student

## 2020-08-21 ENCOUNTER — Ambulatory Visit: Payer: PPO | Admitting: Student

## 2020-08-21 ENCOUNTER — Other Ambulatory Visit: Payer: Self-pay

## 2020-08-21 VITALS — BP 138/78 | HR 82 | Ht 72.0 in | Wt 195.4 lb

## 2020-08-21 DIAGNOSIS — I2510 Atherosclerotic heart disease of native coronary artery without angina pectoris: Secondary | ICD-10-CM

## 2020-08-21 DIAGNOSIS — I10 Essential (primary) hypertension: Secondary | ICD-10-CM | POA: Diagnosis not present

## 2020-08-21 DIAGNOSIS — I442 Atrioventricular block, complete: Secondary | ICD-10-CM | POA: Diagnosis not present

## 2020-08-21 DIAGNOSIS — I5042 Chronic combined systolic (congestive) and diastolic (congestive) heart failure: Secondary | ICD-10-CM

## 2020-08-21 DIAGNOSIS — Z79899 Other long term (current) drug therapy: Secondary | ICD-10-CM | POA: Diagnosis not present

## 2020-08-21 MED ORDER — FUROSEMIDE 40 MG PO TABS: 40.0000 mg | ORAL_TABLET | Freq: Every day | ORAL | Status: DC

## 2020-08-21 MED ORDER — SPIRONOLACTONE 25 MG PO TABS: 12.5000 mg | ORAL_TABLET | Freq: Every day | ORAL | 6 refills | Status: DC

## 2020-08-21 NOTE — Progress Notes (Signed)
Cardiology Office Note    Date:  08/21/2020   ID:  Grant Stevens, DOB 1940/07/01, MRN [id number]  PCP:  [name], MD  Cardiologist: Rozann Lesches, MD   EP: Dr. Lovena Le  Chief Complaint  Patient presents with   Follow-up    6 month visit    History of Present Illness:    Grant Stevens is a 80 y.o. male with past medical history of CAD (s/p CABG in 2002), chronic combined systolic and diastolic CHF/ICM (EF [id number] by echo in 10/2018 and he had declined ICD placement), SSS (s/p St. Jude Dual PPM in 05/2020), HTN and HLD who presents to the office today for 39-monthfollow-up.  He was examined by myself in 12/2019 and denied any recent anginal symptoms at that time. He was on Lasix and Lisinopril for his cardiomyopathy and did not wish to pursue further medication adjustments including adding Spironolactone or switching from Lisinopril to Entresto. He was not on a beta-blocker given his baseline conduction disease and he previously declined ICD placement.   He was hospitalized in 05/2020 after a syncopal episode and was found to be in complete heart block with heart rate in the teens to 20's. He did undergo placement of a St. Jude Dual PPM that admission. Echo that admission showed his EF had further declined to 25-30%. He did follow-up with Dr. TLovena Lein 08/2020 and his device was programmed to DDD with a long AV delay allowing for intrinsic conduction.  In talking with the patient today, he reports he feels like his fatigue improved after his device was reprogrammed. He denies any recent chest pain or palpitations. No recent orthopnea, PND or pitting edema. Says he has been taking Lasix '40mg'$  daily (listed as '80mg'$  daily).    Past Medical History:  Diagnosis Date   Allergic rhinitis    Arthritis    Asthma    Childhood   Atrial [place] ([place]    Remote history - WARCEF   Bladder cancer (HCC)    BPH (benign prostatic hyperplasia)    Cardiomyopathy (HCC)    CHF (congestive  heart failure) (HCC)    Coronary atherosclerosis of native coronary artery    Multivessel s/p CABG in 06/2001 post-IMI, LVEF 35% up to 50% postoperatively;   Depression    Difficulty sleeping    Erectile dysfunction    Essential hypertension    Frequency of urination    History of bipolar disorder    Hyperlipidemia    IBS (irritable bowel syndrome)    Myocardial infarction ([place] 2002    Past Surgical History:  Procedure Laterality Date   BIOPSY  03/28/2018   Procedure: BIOPSY;  Surgeon: RRogene Houston MD;  [facility];  Service: Endoscopy;;  esophagus   BIV PACEMAKER INSERTION CRT-P N/A 05/12/2020   Procedure: BIV PACEMAKER INSERTION CRT-P;  Surgeon: AThompson Grayer MD;  [facility];  Service: Cardiovascular;  Laterality: N/A;   CATARACT EXTRACTION W/PHACO Left 12/29/2014   Procedure: CATARACT EXTRACTION PHACO AND INTRAOCULAR LENS PLACEMENT (IOC);  Surgeon: CWilliams Che MD;  Location: [facility];  Service: Ophthalmology;  Laterality: Left;  CDE:3.71   CATARACT EXTRACTION W/PHACO Right 03/30/2015   Procedure: CATARACT EXTRACTION PHACO AND INTRAOCULAR LENS PLACEMENT RIGHT EYE CDE=2.56;  Surgeon: CWilliams Che MD;  Location: [facility];  Service: Ophthalmology;  Laterality: Right;   CIRCUMCISION  10/11/2003   COLONOSCOPY N/A 07/11/2012   rehman,transverse colon polyp (benign lymphoid); tortuous colon; prep adequate with much  suction/lavage; hemorrhoids.   CORONARY ARTERY BYPASS GRAFT  08/10/2000   Dr. Servando Snare - LIMA to LAD, SVG to diagonal, SVG to PDA TRIPLE BYPASS   CYSTOSCOPY W/ RETROGRADES Bilateral 03/02/2015   Procedure: CYSTOSCOPY WITH RIGHT RETROGRADE PYELOGRAM,  ATTEMPTED LEFT RETROGRADE PYELOGRAM;  Surgeon: Cleon Gustin, MD;  Location: [facility];  Service: Urology;  Laterality: Bilateral;   ESOPHAGEAL DILATION N/A 03/28/2018   Procedure: ESOPHAGEAL DILATION;  Surgeon: Rogene Houston, MD;  [facility];  Service: Endoscopy;   Laterality: N/A;   ESOPHAGOGASTRODUODENOSCOPY N/A 03/28/2018   rehman,Abnormal esophageal motility. mild schatzki ring at GEJ, dilated, patch of salmon colored mucosa at distal esophagus. Barrett's esophagus. 2cm HH. erosive gastropathy, normal pylorus, duodenal erosions w/o bleeding. normal second portion of duodenum   TEMPORARY PACEMAKER N/A 05/12/2020   Procedure: TEMPORARY PACEMAKER;  Surgeon: Martinique, Peter M, MD;  Location: [facility];  Service: Cardiovascular;  Laterality: N/A;   TONSILLECTOMY     TRANSURETHRAL RESECTION OF BLADDER TUMOR N/A 01/26/2015   Procedure: TRANSURETHRAL RESECTION OF BLADDER TUMOR (TURBT);  Surgeon: Cleon Gustin, MD;  [facility];  Service: Urology;  Laterality: N/A;   TRANSURETHRAL RESECTION OF BLADDER TUMOR WITH GYRUS (TURBT-GYRUS) N/A 03/02/2015   Procedure: TRANSURETHRAL RESECTION OF BLADDER TUMOR WITH GYRUS (TURBT-GYRUS);  Surgeon: Cleon Gustin, MD;  Location[facility];  Service: Urology;  Laterality: N/A;   TRANSURETHRAL RESECTION OF PROSTATE N/A 01/26/2015   Procedure: TRANSURETHRAL RESECTION OF THE PROSTATE WITH GYRUS INSTRUMENTS;  Surgeon: Cleon Gustin, MD;  Location: [facility];  Service: Urology;  Laterality: N/A;    Current Medications: Outpatient Medications Prior to Visit  Medication Sig Dispense Refill   aspirin EC 81 MG tablet Take 1 tablet (81 mg total) by mouth daily.     atorvastatin (LIPITOR) 80 MG tablet Take 80 mg by mouth at bedtime.       carbamazepine (TEGRETOL) 200 MG tablet Take 400 mg by mouth 2 (two) times daily.      divalproex (DEPAKOTE ER) 250 MG 24 hr tablet Take 250 mg by mouth in the morning and at bedtime.     famotidine (PEPCID) 40 MG tablet TAKE (1) TABLET BY MOUTH AT BEDTIME. 30 tablet 5   lisinopril (ZESTRIL) 5 MG tablet TAKE (1) TABLET BY MOUTH ONCE DAILY. 60 tablet 0   LORazepam (ATIVAN) 0.5 MG tablet Take 0.5 mg by mouth at bedtime.     Psyllium 0.36 g CAPS Take 2 capsules by mouth daily.      tamsulosin (FLOMAX) 0.4 MG CAPS capsule Take 1 capsule (0.4 mg total) by mouth at bedtime. 90 capsule 3   furosemide (LASIX) 40 MG tablet Take 80 mg by mouth.     No facility-administered medications prior to visit.     Allergies:   Patient has no known allergies.   Social History   Socioeconomic History   Marital status: Widowed    Spouse name: Not on file   Number of children: Not on file   Years of education: Not on file   Highest education level: Not on file  Occupational History   Not on file  Tobacco Use   Smoking status: Former    Packs/day: 1.00    Years: 15.00    Pack years: 15.00    Types: Cigarettes    Quit date: 01/11/1971    Years since quitting: 49.6   Smokeless tobacco: Never  Vaping Use   Vaping Use: Never used  Substance and Sexual  Activity   Alcohol use: Not Currently    Comment: occ.   Drug use: No   Sexual activity: Not on file  Other Topics Concern   Not on file  Social History Narrative   Married with no children    Exercises 4 times weekly   Caffeine once a day   Social Determinants of Health   Financial Resource Strain: Not on file  Food Insecurity: Not on file  Transportation Needs: Not on file  Physical Activity: Not on file  Stress: Not on file  Social Connections: Not on file     Family History:  The patient's family history includes Asthma in his mother; Diabetes in his brother; Heart attack (age of onset: 69) in his father.   Review of Systems:    Please see the history of present illness.     All other systems reviewed and are otherwise negative except as noted above.   Physical Exam:    VS:  BP 138/78   Pulse 82   Ht 6' (1.829 m)   Wt 195 lb 6.4 oz (88.6 kg)   SpO2 98%   BMI 26.50 kg/m    General: Pleasant elderly male appearing in no acute distress. Head: Normocephalic, atraumatic. Neck: No carotid bruits. JVD not elevated.  Lungs: Respirations regular and unlabored, without wheezes or rales.  Heart: Regular rate  and rhythm. No S3 or S4. 2/6 SEM along RUSB.  Abdomen: Appears non-distended. No obvious abdominal masses. Msk:  Strength and tone appear normal for age. No obvious joint deformities or effusions. Extremities: No clubbing or cyanosis. No pitting edema.  Distal pedal pulses are 2+ bilaterally. Neuro: Alert and oriented X 3. Moves all extremities spontaneously. No focal deficits noted. Psych:  Responds to questions appropriately with a normal affect. Skin: No rashes or lesions noted  Wt Readings from Last 3 Encounters:  08/21/20 195 lb 6.4 oz (88.6 kg)  08/18/20 193 lb 9.6 oz (87.8 kg)  08/17/20 191 lb (86.6 kg)     Studies/Labs Reviewed:   EKG:  EKG is not ordered today.    Recent Labs: 05/12/2020: ALT 102; BUN 25; Creatinine, Ser 1.30; Hemoglobin 13.9; Platelets 159; Potassium 4.1; Sodium 134   Lipid Panel    Component Value Date/Time   CHOL 145 05/12/2020 1101   TRIG 79 05/12/2020 1101   HDL 48 05/12/2020 1101   CHOLHDL 3.0 05/12/2020 1101   VLDL 16 05/12/2020 1101   LDLCALC 81 05/12/2020 1101    Additional studies/ records that were reviewed today include:   Limited Echocardiogram: 05/2020 IMPRESSIONS     1. Left ventricular ejection fraction, by estimation, is 25 to 30%. The  left ventricle has severely decreased function. The left ventricle  demonstrates global hypokinesis. There is moderate left ventricular  hypertrophy.   2. Right ventricular systolic function is normal. The right ventricular  size is mildly enlarged. There is normal pulmonary artery systolic  pressure. The estimated right ventricular systolic pressure is [id number] mmHg.   3. Left atrial size was moderately dilated.   4. Right atrial size was mildly dilated.   5. The mitral valve is degenerative. Mild mitral valve regurgitation. No  evidence of mitral stenosis. Moderate mitral annular calcification.   6. The aortic valve is tricuspid. Aortic valve regurgitation is mild.  Mild to moderate aortic valve  sclerosis/calcification is present, without  any evidence of aortic stenosis.   7. The inferior vena cava is normal in size with greater than 50%  respiratory variability,  suggesting right atrial pressure of 3 mmHg.   Comparison(s): No significant change from prior study. Prior images  reviewed side by side.   Assessment:    1. Coronary artery disease involving native coronary artery of native heart without angina pectoris   2. Medication management   3. Chronic combined systolic and diastolic heart failure ([place])   4. Heart block AV complete ([place])   5. Essential hypertension      Plan:   In order of problems listed above:  1. CAD - He is s/p CABG in 2002 with NST in 11/2013 showing no ischemia. He denies any recent anginal symptoms. Continue current medication regimen with ASA '81mg'$  daily and Atorvastatin '80mg'$  daily.   2. HFrEF - His EF was at 30-35% by echo in 10/2018 and he had declined ICD placement. Recent echo earlier this year shows his EF has further declined to 25-30%. He was previously reluctant to medication adjustments but we did discuss options again today. He is willing to try Spironolactone 12.'5mg'$  daily. Recheck BMET in 2 weeks. Continue Lisinopril '5mg'$  daily and Lasix '40mg'$  daily. BP did not previously allow for Entresto. Could consider adding an SGLT2 inhibitor in the future.   3. SSS  - He is s/p St. Jude Dual PPM in 05/2020 which is followed by Dr. Lovena Le. Device recently reprogrammed to DDD and he reports improvement in his fatigue since.  4. HTN - His BP is at 138/78 during today's visit. Continue Lisinopril '5mg'$  daily and will add Spironolactone as outlined above.     Medication Adjustments/Labs and Tests Ordered: Current medicines are reviewed at length with the patient today.  Concerns regarding medicines are outlined above.  Medication changes, Labs and Tests ordered today are listed in the Patient Instructions below. Patient Instructions  Medication  Instructions:  Your physician recommends that you continue on your current medications as directed. Please refer to the Current Medication list given to you today.  Start Spironolactone 12.5 mg Daily   *If you need a refill on your cardiac medications before your next appointment, please call your pharmacy*   Lab Work: Your physician recommends that you return for lab work in: 2 [place] (09/04/20)   If you have labs (blood work) drawn today and your tests are completely normal, you will receive your results only by: [place] (if you have [place]) OR A paper copy in the mail If you have any lab test that is abnormal or we need to change your treatment, we will call you to review the results.   Testing/Procedures: NONE    Follow-Up: At [facility], you and your health needs are our priority.  As part of our continuing mission to provide you with exceptional heart care, we have created designated Provider Care Teams.  These Care Teams include your primary Cardiologist (physician) and Advanced Practice Providers (APPs -  Physician Assistants and Nurse Practitioners) who all work together to provide you with the care you need, when you need it.  We recommend signing up for the patient portal called "MyChart".  Sign up information is provided on this After Visit Summary.  MyChart is used to connect with patients for Virtual Visits (Telemedicine).  Patients are able to view lab/test results, encounter notes, upcoming appointments, etc.  Non-urgent messages can be sent to your provider as well.   To learn more about what you can do with MyChart, go to [email or web address].    Your next appointment:   3-4 month(s)  The format for your  next appointment:   In Person  Provider:   Rozann Lesches, MD   Other Instructions Thank you for choosing [facility]!     Signed, Erma Heritage, PA-C  08/21/2020 7:10 PM    [facility] [address] [place], [place] [address] Phone: [phone] Fax: [phone]

## 2020-08-21 NOTE — Patient Instructions (Signed)
Medication Instructions:  Your physician recommends that you continue on your current medications as directed. Please refer to the Current Medication list given to you today.  Start Spironolactone 12.5 mg Daily   *If you need a refill on your cardiac medications before your next appointment, please call your pharmacy*   Lab Work: Your physician recommends that you return for lab work in: 2 [place] (09/04/20)   If you have labs (blood work) drawn today and your tests are completely normal, you will receive your results only by: [place] (if you have [place]) OR A paper copy in the mail If you have any lab test that is abnormal or we need to change your treatment, we will call you to review the results.   Testing/Procedures: NONE    Follow-Up: At [facility], you and your health needs are our priority.  As part of our continuing mission to provide you with exceptional heart care, we have created designated Provider Care Teams.  These Care Teams include your primary Cardiologist (physician) and Advanced Practice Providers (APPs -  Physician Assistants and Nurse Practitioners) who all work together to provide you with the care you need, when you need it.  We recommend signing up for the patient portal called "MyChart".  Sign up information is provided on this After Visit Summary.  MyChart is used to connect with patients for Virtual Visits (Telemedicine).  Patients are able to view lab/test results, encounter notes, upcoming appointments, etc.  Non-urgent messages can be sent to your provider as well.   To learn more about what you can do with MyChart, go to [email or web address].    Your next appointment:   3-4 month(s)  The format for your next appointment:   In Person  Provider:   Rozann Lesches, MD   Other Instructions Thank you for choosing [facility]!

## 2020-09-04 ENCOUNTER — Other Ambulatory Visit: Payer: Self-pay

## 2020-09-04 ENCOUNTER — Telehealth: Payer: Self-pay

## 2020-09-04 ENCOUNTER — Other Ambulatory Visit (HOSPITAL_COMMUNITY)
Admission: RE | Admit: 2020-09-04 | Discharge: 2020-09-04 | Disposition: A | Discharge status: Home / Self Care | Payer: PPO | Source: Ambulatory Visit | Attending: Student | Admitting: Student

## 2020-09-04 DIAGNOSIS — Z79899 Other long term (current) drug therapy: Secondary | ICD-10-CM | POA: Diagnosis not present

## 2020-09-04 LAB — BASIC METABOLIC PANEL
Anion gap: 7 (ref 5–15)
BUN: 19 mg/dL (ref 8–23)
CO2: 28 mmol/L (ref 22–32)
Calcium: 8.6 mg/dL — ABNORMAL LOW (ref 8.9–10.3)
Chloride: 90 mmol/L — ABNORMAL LOW (ref 98–111)
Creatinine, Ser: 1.01 mg/dL (ref 0.61–1.24)
GFR, Estimated: >60 mL/min (ref >60)
Glucose, Bld: 102 mg/dL — ABNORMAL HIGH (ref 70–99)
Potassium: 4.8 mmol/L (ref 3.5–5.1)
Sodium: 125 mmol/L — ABNORMAL LOW (ref 135–145)

## 2020-09-04 MED ORDER — FUROSEMIDE 20 MG PO TABS: 20.0000 mg | ORAL_TABLET | Freq: Every day | ORAL | 6 refills | Status: DC

## 2020-09-04 NOTE — Telephone Encounter (Signed)
-----   Message from Erma Heritage, [place] sent at 09/04/2020 10:00 AM EDT ----- Please let the patient know his renal function has actually improved since adding Spironolactone. Potassium is normal. Sodium level has declined. If his volume status has been stable, I would recommend reducing Lasix from '40mg'$  daily to '20mg'$  daily given that Spironolactone has a diuretic effect as well and the combination of the two could be dropping his sodium level. Recheck BMET again in 7-10 days.

## 2020-09-04 NOTE — Telephone Encounter (Signed)
I spoke with patient and he agrees to decrease lasix to 20 mg daily and repeat bmet in 1 week at [facility].

## 2020-09-08 NOTE — Progress Notes (Signed)
Remote pacemaker transmission.   

## 2020-09-11 ENCOUNTER — Other Ambulatory Visit: Payer: Self-pay

## 2020-09-11 ENCOUNTER — Other Ambulatory Visit (HOSPITAL_COMMUNITY)
Admission: RE | Admit: 2020-09-11 | Discharge: 2020-09-11 | Disposition: A | Discharge status: Home / Self Care | Payer: PPO | Source: Ambulatory Visit | Attending: Student | Admitting: Student

## 2020-09-11 DIAGNOSIS — Z79899 Other long term (current) drug therapy: Secondary | ICD-10-CM | POA: Diagnosis not present

## 2020-09-11 LAB — BASIC METABOLIC PANEL
Anion gap: 9 (ref 5–15)
BUN: 18 mg/dL (ref 8–23)
CO2: 24 mmol/L (ref 22–32)
Calcium: 8.7 mg/dL — ABNORMAL LOW (ref 8.9–10.3)
Chloride: 94 mmol/L — ABNORMAL LOW (ref 98–111)
Creatinine, Ser: 0.95 mg/dL (ref 0.61–1.24)
GFR, Estimated: >60 mL/min (ref >60)
Glucose, Bld: 95 mg/dL (ref 70–99)
Potassium: 5 mmol/L (ref 3.5–5.1)
Sodium: 127 mmol/L — ABNORMAL LOW (ref 135–145)

## 2020-09-16 ENCOUNTER — Telehealth: Payer: Self-pay | Admitting: *Deleted

## 2020-09-16 DIAGNOSIS — Z79899 Other long term (current) drug therapy: Secondary | ICD-10-CM

## 2020-09-16 DIAGNOSIS — I10 Essential (primary) hypertension: Secondary | ICD-10-CM

## 2020-09-16 NOTE — Telephone Encounter (Signed)
-----   Message from Erma Heritage, [place] sent at 09/11/2020 12:06 PM EDT ----- Please let the patient know that his kidney function continues to improve and his sodium level is starting to trend back up with dose reduction of Lasix. Potassium is increasing and I would confirm that he is not taking potassium supplementation. If he is, please let me know as we will need to reduce the dose. If not on potassium segmentation, please make sure he is cutting Spironolactone in half and only taking 1/2 tablet and would make sure he is not consuming tomatoes or bananas regularly as they are rich in K+.  Would recheck in 3 to 4 weeks by [place] or his PCP to make sure K+ has stabilized.

## 2020-09-16 NOTE — Telephone Encounter (Signed)
Pt called back stating that he's not taking the potassium and that instead he's taking Metamucil each morning and he says it has '25mg'$  of potassium in the tablet.

## 2020-09-16 NOTE — Telephone Encounter (Signed)
Pt notified and order placed 

## 2020-09-21 NOTE — Telephone Encounter (Signed)
Returned call to pt. No answer. Left msg to call back.  

## 2020-09-28 DIAGNOSIS — F3110 Bipolar disorder, current episode manic without psychotic features, unspecified: Secondary | ICD-10-CM | POA: Diagnosis not present

## 2020-09-30 DIAGNOSIS — L570 Actinic keratosis: Secondary | ICD-10-CM | POA: Diagnosis not present

## 2020-09-30 DIAGNOSIS — B078 Other viral warts: Secondary | ICD-10-CM | POA: Diagnosis not present

## 2020-09-30 DIAGNOSIS — X32XXXD Exposure to sunlight, subsequent encounter: Secondary | ICD-10-CM | POA: Diagnosis not present

## 2020-09-30 NOTE — Telephone Encounter (Signed)
Pt notified and voiced understanding 

## 2020-10-07 ENCOUNTER — Encounter: Payer: Self-pay | Admitting: Student

## 2020-10-07 ENCOUNTER — Other Ambulatory Visit (HOSPITAL_COMMUNITY)
Admission: RE | Admit: 2020-10-07 | Discharge: 2020-10-07 | Disposition: A | Discharge status: Home / Self Care | Payer: PPO | Source: Ambulatory Visit | Attending: Student | Admitting: Student

## 2020-10-07 ENCOUNTER — Other Ambulatory Visit: Payer: Self-pay

## 2020-10-07 DIAGNOSIS — Z79899 Other long term (current) drug therapy: Secondary | ICD-10-CM | POA: Diagnosis not present

## 2020-10-07 DIAGNOSIS — I10 Essential (primary) hypertension: Secondary | ICD-10-CM | POA: Insufficient documentation

## 2020-10-07 LAB — BASIC METABOLIC PANEL
Anion gap: 8 (ref 5–15)
BUN: 17 mg/dL (ref 8–23)
CO2: 25 mmol/L (ref 22–32)
Calcium: 8.7 mg/dL — ABNORMAL LOW (ref 8.9–10.3)
Chloride: 92 mmol/L — ABNORMAL LOW (ref 98–111)
Creatinine, Ser: 1.03 mg/dL (ref 0.61–1.24)
GFR, Estimated: >60 mL/min (ref >60)
Glucose, Bld: 110 mg/dL — ABNORMAL HIGH (ref 70–99)
Potassium: 5 mmol/L (ref 3.5–5.1)
Sodium: 125 mmol/L — ABNORMAL LOW (ref 135–145)

## 2020-10-08 ENCOUNTER — Telehealth: Payer: Self-pay

## 2020-10-08 NOTE — Telephone Encounter (Signed)
-----   Message from Erma Heritage, [place] sent at 10/07/2020 10:33 AM EDT ----- Please let the patient know his kidney function remains stable. Potassium remains at the high-end of normal and sodium is low but similar to prior values. We have had him on the lowest dose of Spironolactone and given his electrolyte abnormalities, I would recommend stopping it at this time. Can consider additional medication options at his follow-up visit (perhaps adding an SGLT2 inhibitor).

## 2020-10-08 NOTE — Telephone Encounter (Addendum)
I spoke with patient and he agrees to stop spironolactone.

## 2020-10-16 ENCOUNTER — Other Ambulatory Visit: Payer: Self-pay | Admitting: Cardiology

## 2020-10-30 DIAGNOSIS — I442 Atrioventricular block, complete: Secondary | ICD-10-CM | POA: Diagnosis not present

## 2020-10-30 DIAGNOSIS — E871 Hypo-osmolality and hyponatremia: Secondary | ICD-10-CM | POA: Diagnosis not present

## 2020-10-30 DIAGNOSIS — I5022 Chronic systolic (congestive) heart failure: Secondary | ICD-10-CM | POA: Diagnosis not present

## 2020-10-30 DIAGNOSIS — Z79899 Other long term (current) drug therapy: Secondary | ICD-10-CM | POA: Diagnosis not present

## 2020-11-06 DIAGNOSIS — Z23 Encounter for immunization: Secondary | ICD-10-CM | POA: Diagnosis not present

## 2020-11-06 DIAGNOSIS — I5022 Chronic systolic (congestive) heart failure: Secondary | ICD-10-CM | POA: Diagnosis not present

## 2020-11-06 DIAGNOSIS — I2510 Atherosclerotic heart disease of native coronary artery without angina pectoris: Secondary | ICD-10-CM | POA: Diagnosis not present

## 2020-11-06 DIAGNOSIS — E871 Hypo-osmolality and hyponatremia: Secondary | ICD-10-CM | POA: Diagnosis not present

## 2020-11-06 DIAGNOSIS — F315 Bipolar disorder, current episode depressed, severe, with psychotic features: Secondary | ICD-10-CM | POA: Diagnosis not present

## 2020-11-06 DIAGNOSIS — Q246 Congenital heart block: Secondary | ICD-10-CM | POA: Diagnosis not present

## 2020-11-11 ENCOUNTER — Telehealth: Payer: Self-pay

## 2020-11-11 LAB — CUP PACEART REMOTE DEVICE CHECK
Battery Remaining Longevity: 90 mo
Battery Remaining Percentage: 95.5 %
Battery Voltage: 3.01 V
Brady Statistic AP VP Percent: 9.8 %
Brady Statistic AP VS Percent: 18 %
Brady Statistic AS VP Percent: 63 %
Brady Statistic AS VS Percent: 9.9 %
Brady Statistic RA Percent Paced: 27 %
Brady Statistic RV Percent Paced: 72 %
Date Time Interrogation Session: 20221102040427
Implantable Lead Implant Date: 20220503
Implantable Lead Implant Date: 20220503
Implantable Lead Location: 753859
Implantable Lead Location: 753860
Implantable Pulse Generator Implant Date: 20220503
Lead Channel Impedance Value: 390 Ohm
Lead Channel Impedance Value: 460 Ohm
Lead Channel Pacing Threshold Amplitude: 0.625 V
Lead Channel Pacing Threshold Amplitude: 0.75 V
Lead Channel Pacing Threshold Pulse Width: 0.5 ms
Lead Channel Pacing Threshold Pulse Width: 0.5 ms
Lead Channel Sensing Intrinsic Amplitude: 2.4 mV
Lead Channel Sensing Intrinsic Amplitude: 6.5 mV
Lead Channel Setting Pacing Amplitude: 2 V
Lead Channel Setting Pacing Amplitude: 5 V
Lead Channel Setting Pacing Pulse Width: 0.5 ms
Lead Channel Setting Sensing Sensitivity: 2 mV
Pulse Gen Model: 2272
Pulse Gen Serial Number: 3920512

## 2020-11-11 NOTE — Telephone Encounter (Signed)
V cap at high output. Need to bring patient into device clinic to check and possibly reprogram.   No answer, LMTCB.

## 2020-11-11 NOTE — Telephone Encounter (Signed)
Patient returned phone call. Advised patient needs device clinic to check device and possible reprogramming.   Device Clinic apt. Made 11/12/20 @ 2:00.

## 2020-11-12 ENCOUNTER — Ambulatory Visit (INDEPENDENT_AMBULATORY_CARE_PROVIDER_SITE_OTHER): Payer: PPO

## 2020-11-12 ENCOUNTER — Other Ambulatory Visit: Payer: Self-pay

## 2020-11-12 DIAGNOSIS — I442 Atrioventricular block, complete: Secondary | ICD-10-CM

## 2020-11-12 LAB — CUP PACEART INCLINIC DEVICE CHECK
Battery Remaining Longevity: 104 mo
Battery Voltage: 3.01 V
Brady Statistic RA Percent Paced: 27 %
Brady Statistic RV Percent Paced: 72 %
Date Time Interrogation Session: 20221103172507
Implantable Lead Implant Date: 20220503
Implantable Lead Implant Date: 20220503
Implantable Lead Location: 753859
Implantable Lead Location: 753860
Implantable Pulse Generator Implant Date: 20220503
Lead Channel Impedance Value: 387.5 Ohm
Lead Channel Impedance Value: 462.5 Ohm
Lead Channel Pacing Threshold Amplitude: 0.75 V
Lead Channel Pacing Threshold Amplitude: 0.75 V
Lead Channel Pacing Threshold Pulse Width: 0.5 ms
Lead Channel Pacing Threshold Pulse Width: 0.5 ms
Lead Channel Sensing Intrinsic Amplitude: 2.4 mV
Lead Channel Sensing Intrinsic Amplitude: 5.4 mV
Lead Channel Setting Pacing Amplitude: 2 V
Lead Channel Setting Pacing Amplitude: 2.5 V
Lead Channel Setting Pacing Pulse Width: 0.5 ms
Lead Channel Setting Sensing Sensitivity: 2 mV
Pulse Gen Model: 2272
Pulse Gen Serial Number: 3920512

## 2020-11-12 NOTE — Progress Notes (Signed)
Pacemaker check in clinic. Normal device function. Thresholds, sensing, impedances consistent with previous measurements. Device programmed to maximize longevity. No mode switch or high ventricular rates noted. RV CC disabled and output fixed at 2.5 V. Device programmed at appropriate safety margins. Histogram distribution appropriate for patient activity level. Device programmed to optimize intrinsic conduction. Estimated longevity 8.2-8.7 years. Patient enrolled in remote follow-up 02/11/20. Patient education completed.

## 2020-11-23 ENCOUNTER — Other Ambulatory Visit: Payer: Self-pay

## 2020-11-23 ENCOUNTER — Telehealth: Payer: Self-pay | Admitting: Student

## 2020-11-23 ENCOUNTER — Ambulatory Visit: Payer: PPO | Admitting: Cardiology

## 2020-11-23 ENCOUNTER — Encounter: Payer: Self-pay | Admitting: Cardiology

## 2020-11-23 ENCOUNTER — Telehealth: Payer: Self-pay | Admitting: Cardiology

## 2020-11-23 VITALS — BP 136/84 | HR 83 | Ht 72.0 in | Wt 198.4 lb

## 2020-11-23 DIAGNOSIS — I5042 Chronic combined systolic (congestive) and diastolic (congestive) heart failure: Secondary | ICD-10-CM | POA: Diagnosis not present

## 2020-11-23 DIAGNOSIS — I442 Atrioventricular block, complete: Secondary | ICD-10-CM

## 2020-11-23 DIAGNOSIS — I5020 Unspecified systolic (congestive) heart failure: Secondary | ICD-10-CM | POA: Diagnosis not present

## 2020-11-23 DIAGNOSIS — I25119 Atherosclerotic heart disease of native coronary artery with unspecified angina pectoris: Secondary | ICD-10-CM | POA: Diagnosis not present

## 2020-11-23 MED ORDER — FUROSEMIDE 40 MG PO TABS: ORAL_TABLET | ORAL | 11 refills | Status: DC

## 2020-11-23 MED ORDER — ENTRESTO 24-26 MG PO TABS: 1.0000 | ORAL_TABLET | Freq: Two times a day (BID) | ORAL | 11 refills | Status: DC

## 2020-11-23 NOTE — Patient Instructions (Signed)
Medication Instructions:   Stop Taking Lisinopril  Start Entresto 24-26 mg on Wednesday Morning Take Lasix 40 mg alternating with 20 mg Daily   *If you need a refill on your cardiac medications before your next appointment, please call your pharmacy*   Lab Work: Your physician recommends that you return for lab work in: 1 Week ( 11/30/20)   If you have labs (blood work) drawn today and your tests are completely normal, you will receive your results only by: [place] (if you have MyChart) OR A paper copy in the mail If you have any lab test that is abnormal or we need to change your treatment, we will call you to review the results.   Testing/Procedures: NONE    Follow-Up: At [facility], you and your health needs are our priority.  As part of our continuing mission to provide you with exceptional heart care, we have created designated Provider Care Teams.  These Care Teams include your primary Cardiologist (physician) and Advanced Practice Providers (APPs -  Physician Assistants and Nurse Practitioners) who all work together to provide you with the care you need, when you need it.  We recommend signing up for the patient portal called "MyChart".  Sign up information is provided on this After Visit Summary.  MyChart is used to connect with patients for Virtual Visits (Telemedicine).  Patients are able to view lab/test results, encounter notes, upcoming appointments, etc.  Non-urgent messages can be sent to your provider as well.   To learn more about what you can do with MyChart, go to [email or web address].    Your next appointment:   3 week(s)  The format for your next appointment:   In Person  Provider:   Rozann Lesches, MD or Bernerd Pho, PA-C    Other Instructions Thank you for choosing [facility]!

## 2020-11-23 NOTE — Progress Notes (Signed)
Cardiology Office Note  Date: 11/23/2020   ID: Grant Stevens, DOB 03/06/1940, MRN [id number]  PCP:  [name], MD  Cardiologist:  Rozann Lesches, MD Electrophysiologist:  None   Chief Complaint  Patient presents with   Shortness of Breath    History of Present Illness: Grant Stevens is an 80 y.o. male last seen in August by Ms. Strader PA-C.  I reviewed his history since our last encounter back in April 2021.  He is here today with his sister.  At the last visit he was started on low-dose Aldactone, otherwise continued on lisinopril.  Follow-up lab work showed hyponatremia with sodium 125, high normal potassium of 5.0 and creatinine stable at 1.03.  He was taken off Aldactone.  He tells me that over the last 2 weeks he has been more short of breath with activity, no chest pain.  No dizziness or syncope.  He reports NYHA class II-III symptoms.  He does not weigh himself regularly.  His weight is up 5 pounds by our scale since August.  He now has a St. Jude pacemaker in place for treatment of complete heart block diagnosed in May of this year.  He follows with Dr. Lovena Le.  He had previously declined ICD.  Most recent device check indicated normal function, no mode switches or high ventricular rates.  Follow-up echocardiogram in May revealed LVEF 25 to 30% range with global hypokinesis, normal RV contraction and estimated RVSP.  We went over his medications today and discussed changes.  Past Medical History:  Diagnosis Date   Allergic rhinitis    Arthritis    Asthma    Childhood   Atrial fibrillation ([place])    Remote history - WARCEF   Bladder cancer (HCC)    BPH (benign prostatic hyperplasia)    Cardiomyopathy (HCC)    CHF (congestive heart failure) (HCC)    Coronary atherosclerosis of native coronary artery    Multivessel s/p CABG in 06/2001 post-IMI, LVEF 35% up to 50% postoperatively;   Depression    Difficulty sleeping    Erectile dysfunction    Essential  hypertension    Frequency of urination    History of bipolar disorder    Hyperlipidemia    IBS (irritable bowel syndrome)    Myocardial infarction ([place]) 2002    Past Surgical History:  Procedure Laterality Date   BIOPSY  03/28/2018   Procedure: BIOPSY;  Surgeon: Rogene Houston, MD;  [facility];  Service: Endoscopy;;  esophagus   BIV PACEMAKER INSERTION CRT-P N/A 05/12/2020   Procedure: BIV PACEMAKER INSERTION CRT-P;  Surgeon: Thompson Grayer, MD;  Location: [facility];  Service: Cardiovascular;  Laterality: N/A;   CATARACT EXTRACTION W/PHACO Left 12/29/2014   Procedure: CATARACT EXTRACTION PHACO AND INTRAOCULAR LENS PLACEMENT (IOC);  Surgeon: Williams Che, MD;  Location: [facility];  Service: Ophthalmology;  Laterality: Left;  CDE:3.71   CATARACT EXTRACTION W/PHACO Right 03/30/2015   Procedure: CATARACT EXTRACTION PHACO AND INTRAOCULAR LENS PLACEMENT RIGHT EYE CDE=2.56;  Surgeon: Williams Che, MD;  [facility]: [facility];  Service: Ophthalmology;  Laterality: Right;   CIRCUMCISION  10/11/2003   COLONOSCOPY N/A 07/11/2012   rehman,transverse colon polyp (benign lymphoid); tortuous colon; prep adequate with much suction/lavage; hemorrhoids.   CORONARY ARTERY BYPASS GRAFT  08/10/2000   Dr. Servando Snare - LIMA to LAD, SVG to diagonal, SVG to PDA TRIPLE BYPASS   CYSTOSCOPY W/ RETROGRADES Bilateral 03/02/2015   Procedure: CYSTOSCOPY WITH RIGHT RETROGRADE PYELOGRAM,  ATTEMPTED LEFT RETROGRADE PYELOGRAM;  Surgeon: Cleon Gustin, MD;  [facility];  Service: Urology;  Laterality: Bilateral;   ESOPHAGEAL DILATION N/A 03/28/2018   Procedure: ESOPHAGEAL DILATION;  Surgeon: Rogene Houston, MD;  [facility];  Service: Endoscopy;  Laterality: N/A;   ESOPHAGOGASTRODUODENOSCOPY N/A 03/28/2018   rehman,Abnormal esophageal motility. mild schatzki ring at GEJ, dilated, patch of salmon colored mucosa at distal esophagus. Barrett's esophagus. 2cm HH. erosive gastropathy,  normal pylorus, duodenal erosions w/o bleeding. normal second portion of duodenum   TEMPORARY PACEMAKER N/A 05/12/2020   Procedure: TEMPORARY PACEMAKER;  Surgeon: Martinique, Peter M, MD;  Location: [place];  Service: Cardiovascular;  Laterality: N/A;   TONSILLECTOMY     TRANSURETHRAL RESECTION OF BLADDER TUMOR N/A 01/26/2015   Procedure: TRANSURETHRAL RESECTION OF BLADDER TUMOR (TURBT);  Surgeon: Cleon Gustin, MD;  [facility];  Service: Urology;  Laterality: N/A;   TRANSURETHRAL RESECTION OF BLADDER TUMOR WITH GYRUS (TURBT-GYRUS) N/A 03/02/2015   Procedure: TRANSURETHRAL RESECTION OF BLADDER TUMOR WITH GYRUS (TURBT-GYRUS);  Surgeon: Cleon Gustin, MD;  [facility];  Service: Urology;  Laterality: N/A;   TRANSURETHRAL RESECTION OF PROSTATE N/A 01/26/2015   Procedure: TRANSURETHRAL RESECTION OF THE PROSTATE WITH GYRUS INSTRUMENTS;  Surgeon: Cleon Gustin, MD;  [facility];  Service: Urology;  Laterality: N/A;    Current Outpatient Medications  Medication Sig Dispense Refill   aspirin EC 81 MG tablet Take 1 tablet (81 mg total) by mouth daily.     atorvastatin (LIPITOR) 80 MG tablet Take 80 mg by mouth at bedtime.       carbamazepine (TEGRETOL) 200 MG tablet Take 400 mg by mouth 2 (two) times daily.      divalproex (DEPAKOTE ER) 250 MG 24 hr tablet Take 250 mg by mouth in the morning and at bedtime.     famotidine (PEPCID) 40 MG tablet TAKE (1) TABLET BY MOUTH AT BEDTIME. 30 tablet 5   furosemide (LASIX) 40 MG tablet Take Lasix 40 mg alternating with 20 mg Daily 45 tablet 11   LORazepam (ATIVAN) 0.5 MG tablet Take 0.5 mg by mouth at bedtime.     Psyllium 0.36 g CAPS Take 2 capsules by mouth daily.     sacubitril-valsartan (ENTRESTO) 24-26 MG Take 1 tablet by mouth 2 (two) times daily. 60 tablet 11   tamsulosin (FLOMAX) 0.4 MG CAPS capsule Take 1 capsule (0.4 mg total) by mouth at bedtime. 90 capsule 3   temazepam (RESTORIL) 15 MG capsule Take 15 mg by  mouth at bedtime as needed for sleep.     No current facility-administered medications for this visit.   Allergies:  Spironolactone   ROS: No orthopnea or PND.  Physical Exam: VS:  BP 136/84   Pulse 83   Ht 6' (1.829 m)   Wt 198 lb 6.4 oz (90 kg)   SpO2 98%   BMI 26.91 kg/m , BMI Body mass index is 26.91 kg/m.  Wt Readings from Last 3 Encounters:  11/23/20 198 lb 6.4 oz (90 kg)  08/21/20 195 lb 6.4 oz (88.6 kg)  08/18/20 193 lb 9.6 oz (87.8 kg)    General: Patient appears comfortable at rest. HEENT: Conjunctiva and lids normal, wearing a mask. Neck: Supple, no elevated JVP or carotid bruits, no thyromegaly. Lungs: Clear to auscultation, nonlabored breathing at rest. Cardiac: Regular rate and rhythm, no S3, 1/6 systolic murmur, no pericardial rub. Extremities: 1+ leg edema.  ECG:  An ECG dated  08/18/2020 was personally reviewed today and demonstrated:  Sinus rhythm with ventricular pacing.  Recent Labwork: 05/12/2020: ALT 102; AST 93; Hemoglobin 13.9; Platelets 159 10/07/2020: BUN 17; Creatinine, Ser 1.03; Potassium 5.0; Sodium 125     Component Value Date/Time   CHOL 145 05/12/2020 1101   TRIG 79 05/12/2020 1101   HDL 48 05/12/2020 1101   CHOLHDL 3.0 05/12/2020 1101   VLDL 16 05/12/2020 1101   LDLCALC 81 05/12/2020 1101    Other Studies Reviewed Today:  Echocardiogram 05/12/2020:  1. Left ventricular ejection fraction, by estimation, is 25 to 30%. The  left ventricle has severely decreased function. The left ventricle  demonstrates global hypokinesis. There is moderate left ventricular  hypertrophy.   2. Right ventricular systolic function is normal. The right ventricular  size is mildly enlarged. There is normal pulmonary artery systolic  pressure. The estimated right ventricular systolic pressure is 65.5 mmHg.   3. Left atrial size was moderately dilated.   4. Right atrial size was mildly dilated.   5. The mitral valve is degenerative. Mild mitral valve  regurgitation. No  evidence of mitral stenosis. Moderate mitral annular calcification.   6. The aortic valve is tricuspid. Aortic valve regurgitation is mild.  Mild to moderate aortic valve sclerosis/calcification is present, without  any evidence of aortic stenosis.   7. The inferior vena cava is normal in size with greater than 50%  respiratory variability, suggesting right atrial pressure of 3 mmHg.   Assessment and Plan:  1.  Worsening shortness of breath, 5 pound weight gain noted since August.  Medication adjustments were attempted after last visit but potassium went up on Aldactone and it was discontinued.  He has been on Lasix 20 mg daily since then.  He has HFrEF with chronic combined heart failure EF decreased to the range of 25 to 30% as of May, RV contraction normal at that time as well as estimated RVSP.  Plan at this point is to discontinue lisinopril and replace it with Entresto 24/26 mg twice daily, also change Lasix to 40 mg alternating with 20 mg every other day.  Check BMET in 1 week.  We will continue to consider additional medication adjustments going forward.  He has not been on beta-blocker given bradycardia previously, although now status post pacemaker so this could be reconsidered.  SGLT2 inhibitor would be another potential option.  Keep follow-up as arranged.  2.  Multivessel CAD status post CABG in 2002.  No active angina although he is experiencing dyspnea on exertion.  Plan to make medication adjustments for associated ischemic cardiomyopathy.  If he does not improve we will need to consider right and left heart catheterization presuming he is in agreement.  3.  Complete heart block status post St. Jude dual-chamber pacemaker in May.  He is following with Dr. Lovena Le.  RV pacing 72% by most recent interrogation.  Medication Adjustments/Labs and Tests Ordered: Current medicines are reviewed at length with the patient today.  Concerns regarding medicines are outlined  above.   Tests Ordered: Orders Placed This Encounter  Procedures   Basic metabolic panel     Medication Changes: Meds ordered this encounter  Medications   sacubitril-valsartan (ENTRESTO) 24-26 MG    Sig: Take 1 tablet by mouth 2 (two) times daily.    Dispense:  60 tablet    Refill:  11   furosemide (LASIX) 40 MG tablet    Sig: Take Lasix 40 mg alternating with 20 mg Daily    Dispense:  45 tablet    Refill:  11     Disposition:  Follow up  as scheduled.  Signed, [name], MD, [facility] 11/23/2020 1:39 PM    [facility] [address] [place], [place] [address] Phone: [phone]; Fax: [phone]

## 2020-11-23 NOTE — Telephone Encounter (Signed)
Pt states that he has been SOB when walking for the last week. Pt denies CP at this time. He does not weigh daily. Pt did weigh today and weighed 204lbs on home scale. No other symptoms at this time. Pt reports having asthma but has not been wheezing with this SOB. He did try to call his PCP and was told to call cardiology. Please advise.

## 2020-11-23 NOTE — Telephone Encounter (Signed)
Pt notified and agrees to come in today at 1:00 pm

## 2020-11-23 NOTE — Telephone Encounter (Signed)
Explained to pt's sister that pt should alternate lasix every other day and not 40mg /20mg  tablets daily.

## 2020-11-23 NOTE — Telephone Encounter (Signed)
Pt c/o medication issue:  1. Name of Medication:   sacubitril-valsartan (ENTRESTO) 24-26 MG furosemide (LASIX) 40 MG tablet  2. How are you currently taking this medication (dosage and times per day)? Needs to clarify   3. Are you having a reaction (difficulty breathing--STAT)? no  4. What is your medication issue? Patient's sister calling to clarify how the patient is supposed to take the medications together.

## 2020-11-23 NOTE — Telephone Encounter (Signed)
Pt c/o Shortness Of Breath: STAT if SOB developed within the last 24 hours or pt is noticeably SOB on the phone  1. Are you currently SOB (can you hear that pt is SOB on the phone)? Yes   2. How long have you been experiencing SOB? A week  3. Are you SOB when sitting or when up moving around? When moving around   4. Are you currently experiencing any other symptoms? No

## 2020-11-30 ENCOUNTER — Ambulatory Visit: Payer: PPO | Admitting: Cardiology

## 2020-11-30 ENCOUNTER — Other Ambulatory Visit (HOSPITAL_COMMUNITY)
Admission: RE | Admit: 2020-11-30 | Discharge: 2020-11-30 | Disposition: A | Discharge status: Home / Self Care | Payer: PPO | Source: Ambulatory Visit | Attending: Cardiology | Admitting: Cardiology

## 2020-11-30 ENCOUNTER — Other Ambulatory Visit: Payer: Self-pay

## 2020-11-30 DIAGNOSIS — I5042 Chronic combined systolic (congestive) and diastolic (congestive) heart failure: Secondary | ICD-10-CM | POA: Insufficient documentation

## 2020-11-30 LAB — BASIC METABOLIC PANEL
Anion gap: 7 (ref 5–15)
BUN: 16 mg/dL (ref 8–23)
CO2: 28 mmol/L (ref 22–32)
Calcium: 8.9 mg/dL (ref 8.9–10.3)
Chloride: 92 mmol/L — ABNORMAL LOW (ref 98–111)
Creatinine, Ser: 0.98 mg/dL (ref 0.61–1.24)
GFR, Estimated: >60 mL/min (ref >60)
Glucose, Bld: 102 mg/dL — ABNORMAL HIGH (ref 70–99)
Potassium: 4.9 mmol/L (ref 3.5–5.1)
Sodium: 127 mmol/L — ABNORMAL LOW (ref 135–145)

## 2020-12-18 ENCOUNTER — Other Ambulatory Visit: Payer: Self-pay

## 2020-12-18 ENCOUNTER — Encounter: Payer: Self-pay | Admitting: Cardiology

## 2020-12-18 ENCOUNTER — Ambulatory Visit: Payer: PPO | Admitting: Cardiology

## 2020-12-18 VITALS — BP 120/72 | HR 76 | Ht 72.0 in | Wt 195.4 lb

## 2020-12-18 DIAGNOSIS — I5020 Unspecified systolic (congestive) heart failure: Secondary | ICD-10-CM

## 2020-12-18 DIAGNOSIS — I25119 Atherosclerotic heart disease of native coronary artery with unspecified angina pectoris: Secondary | ICD-10-CM

## 2020-12-18 DIAGNOSIS — I442 Atrioventricular block, complete: Secondary | ICD-10-CM

## 2020-12-18 MED ORDER — BISOPROLOL FUMARATE 5 MG PO TABS: 2.5000 mg | ORAL_TABLET | Freq: Every day | ORAL | 3 refills | Status: DC

## 2020-12-18 NOTE — Patient Instructions (Signed)
Medication Instructions:   START Bisoprolol 2.5 mg daily   Labwork: None   Testing/Procedures: None   Follow-Up: 6-8 weeks  Any Other Special Instructions Will Be Listed Below (If Applicable).  If you need a refill on your cardiac medications before your next appointment, please call your pharmacy.

## 2020-12-18 NOTE — Progress Notes (Signed)
Cardiology Office Note  Date: 12/18/2020   ID: JOTHAM AHN, DOB Jan 22, 1940, MRN [id number]  PCP:  [name], MD  Cardiologist:  Rozann Lesches, MD Electrophysiologist:  None   Chief Complaint  Patient presents with   Cardiac follow-up    History of Present Illness: Grant Stevens is an 80 y.o. male last seen in November.  He is here today for a follow-up visit.  States that he feels better, his weight is down about 3 pounds, less short of breath.  Still has some orthopnea at nighttime.  He has tolerated transition from lisinopril to [place].  He has a St. Jude pacemaker in place for management of complete heart block, followed by Dr. Lovena Le.  Last device interrogation showed normal function with no mode switching.  We went over his medications today and discussed addition of low-dose bisoprolol.  Past Medical History:  Diagnosis Date   Allergic rhinitis    Arthritis    Asthma    Childhood   Atrial fibrillation ([place])    Remote history - WARCEF   Bladder cancer (HCC)    BPH (benign prostatic hyperplasia)    Cardiomyopathy (HCC)    CHF (congestive heart failure) (HCC)    Coronary atherosclerosis of native coronary artery    Multivessel s/p CABG in 06/2001 post-IMI, LVEF 35% up to 50% postoperatively;   Depression    Difficulty sleeping    Erectile dysfunction    Essential hypertension    Frequency of urination    History of bipolar disorder    Hyperlipidemia    IBS (irritable bowel syndrome)    Myocardial infarction ([place]) 2002    Past Surgical History:  Procedure Laterality Date   BIOPSY  03/28/2018   Procedure: BIOPSY;  Surgeon: Rogene Houston, MD;  [facility];  Service: Endoscopy;;  esophagus   BIV PACEMAKER INSERTION CRT-P N/A 05/12/2020   Procedure: BIV PACEMAKER INSERTION CRT-P;  Surgeon: Thompson Grayer, MD;  Location: [place] [facility];  Service: Cardiovascular;  Laterality: N/A;   CATARACT EXTRACTION W/PHACO Left 12/29/2014    Procedure: CATARACT EXTRACTION PHACO AND INTRAOCULAR LENS PLACEMENT (IOC);  Surgeon: Williams Che, MD;  [facility];  Service: Ophthalmology;  Laterality: Left;  CDE:3.71   CATARACT EXTRACTION W/PHACO Right 03/30/2015   Procedure: CATARACT EXTRACTION PHACO AND INTRAOCULAR LENS PLACEMENT RIGHT EYE CDE=2.56;  Surgeon: Williams Che, MD;  [facility];  Service: Ophthalmology;  Laterality: Right;   CIRCUMCISION  10/11/2003   COLONOSCOPY N/A 07/11/2012   rehman,transverse colon polyp (benign lymphoid); tortuous colon; prep adequate with much suction/lavage; hemorrhoids.   CORONARY ARTERY BYPASS GRAFT  08/10/2000   Dr. Servando Snare - LIMA to LAD, SVG to diagonal, SVG to PDA TRIPLE BYPASS   CYSTOSCOPY W/ RETROGRADES Bilateral 03/02/2015   Procedure: CYSTOSCOPY WITH RIGHT RETROGRADE PYELOGRAM,  ATTEMPTED LEFT RETROGRADE PYELOGRAM;  Surgeon: Cleon Gustin, MD;  [facility];  Service: Urology;  Laterality: Bilateral;   ESOPHAGEAL DILATION N/A 03/28/2018   Procedure: ESOPHAGEAL DILATION;  Surgeon: Rogene Houston, MD;  [facility];  Service: Endoscopy;  Laterality: N/A;   ESOPHAGOGASTRODUODENOSCOPY N/A 03/28/2018   rehman,Abnormal esophageal motility. mild schatzki ring at GEJ, dilated, patch of salmon colored mucosa at distal esophagus. Barrett's esophagus. 2cm HH. erosive gastropathy, normal pylorus, duodenal erosions w/o bleeding. normal second portion of duodenum   TEMPORARY PACEMAKER N/A 05/12/2020   Procedure: TEMPORARY PACEMAKER;  Surgeon: Martinique, Peter M, MD;  Location: [place] [facility];  Service: Cardiovascular;  Laterality: N/A;   TONSILLECTOMY     TRANSURETHRAL RESECTION OF BLADDER TUMOR N/A 01/26/2015   Procedure: TRANSURETHRAL RESECTION OF BLADDER TUMOR (TURBT);  Surgeon: Cleon Gustin, MD;  [facility];  Service: Urology;  Laterality: N/A;   TRANSURETHRAL RESECTION OF BLADDER TUMOR WITH GYRUS (TURBT-GYRUS) N/A 03/02/2015   Procedure:  TRANSURETHRAL RESECTION OF BLADDER TUMOR WITH GYRUS (TURBT-GYRUS);  Surgeon: Cleon Gustin, MD;  [facility];  Service: Urology;  Laterality: N/A;   TRANSURETHRAL RESECTION OF PROSTATE N/A 01/26/2015   Procedure: TRANSURETHRAL RESECTION OF THE PROSTATE WITH GYRUS INSTRUMENTS;  Surgeon: Cleon Gustin, MD;  [facility];  Service: Urology;  Laterality: N/A;    Current Outpatient Medications  Medication Sig Dispense Refill   aspirin EC 81 MG tablet Take 1 tablet (81 mg total) by mouth daily.     atorvastatin (LIPITOR) 80 MG tablet Take 80 mg by mouth at bedtime.       bisoprolol (ZEBETA) 5 MG tablet Take 0.5 tablets (2.5 mg total) by mouth daily. 45 tablet 3   carbamazepine (TEGRETOL) 200 MG tablet Take 400 mg by mouth 2 (two) times daily.      divalproex (DEPAKOTE ER) 250 MG 24 hr tablet Take 250 mg by mouth in the morning and at bedtime.     famotidine (PEPCID) 40 MG tablet TAKE (1) TABLET BY MOUTH AT BEDTIME. 30 tablet 5   furosemide (LASIX) 40 MG tablet Take Lasix 40 mg alternating with 20 mg Daily 45 tablet 11   LORazepam (ATIVAN) 0.5 MG tablet Take 0.5 mg by mouth at bedtime.     Psyllium 0.36 g CAPS Take 2 capsules by mouth daily.     sacubitril-valsartan (ENTRESTO) 24-26 MG Take 1 tablet by mouth 2 (two) times daily. 60 tablet 11   tamsulosin (FLOMAX) 0.4 MG CAPS capsule Take 1 capsule (0.4 mg total) by mouth at bedtime. 90 capsule 3   temazepam (RESTORIL) 15 MG capsule Take 15 mg by mouth at bedtime as needed for sleep.     No current facility-administered medications for this visit.   Allergies:  Spironolactone   ROS: No palpitations.  Physical Exam: VS:  BP 120/72   Pulse 76   Ht 6' (1.829 m)   Wt 195 lb 6.4 oz (88.6 kg)   SpO2 98%   BMI 26.50 kg/m , BMI Body mass index is 26.5 kg/m.  Wt Readings from Last 3 Encounters:  12/18/20 195 lb 6.4 oz (88.6 kg)  11/23/20 198 lb 6.4 oz (90 kg)  08/21/20 195 lb 6.4 oz (88.6 kg)    General: Patient appears  comfortable at rest. HEENT: Conjunctiva and lids normal, wearing a mask. Neck: Supple, no elevated JVP or carotid bruits, no thyromegaly. Lungs: Clear to auscultation, nonlabored breathing at rest. Cardiac: Regular rate and rhythm, no S3, 1/6 systolic murmur, no pericardial rub. Extremities: No pitting edema.  ECG:  An ECG dated 08/18/2020 was personally reviewed today and demonstrated:  Sinus rhythm with ventricular pacing.  Recent Labwork: 05/12/2020: ALT 102; AST 93; Hemoglobin 13.9; Platelets 159 11/30/2020: BUN 16; Creatinine, Ser 0.98; Potassium 4.9; Sodium 127     Component Value Date/Time   CHOL 145 05/12/2020 1101   TRIG 79 05/12/2020 1101   HDL 48 05/12/2020 1101   CHOLHDL 3.0 05/12/2020 1101   VLDL 16 05/12/2020 1101   LDLCALC 81 05/12/2020 1101    Other Studies Reviewed Today:  Echocardiogram 05/12/2020:  1. Left ventricular ejection fraction, by estimation,  is 25 to 30%. The  left ventricle has severely decreased function. The left ventricle  demonstrates global hypokinesis. There is moderate left ventricular  hypertrophy.   2. Right ventricular systolic function is normal. The right ventricular  size is mildly enlarged. There is normal pulmonary artery systolic  pressure. The estimated right ventricular systolic pressure is 11.0 mmHg.   3. Left atrial size was moderately dilated.   4. Right atrial size was mildly dilated.   5. The mitral valve is degenerative. Mild mitral valve regurgitation. No  evidence of mitral stenosis. Moderate mitral annular calcification.   6. The aortic valve is tricuspid. Aortic valve regurgitation is mild.  Mild to moderate aortic valve sclerosis/calcification is present, without  any evidence of aortic stenosis.   7. The inferior vena cava is normal in size with greater than 50%  respiratory variability, suggesting right atrial pressure of 3 mmHg.   Assessment and Plan:  1.  HFrEF with chronic combined heart failure and LVEF 25 to 30%.   We are titrating medical therapy, he has tolerated switch from lisinopril to Entresto and modification and Lasix dosing.  Did not tolerate Aldactone due to hyperkalemia.  Plan to initiate bisoprolol 2.5 mg daily.  Clinical follow-up arranged.  As noted previously, he has declined ICD.  2.  Multivessel CAD status post CABG in 2002.  He does not report any active angina symptoms.  We are managing him medically at this point, likely that he has developed graft disease over time.  We have discussed possibility of right and left heart catheterization if symptoms worsen despite medication adjustments.  3.  Complete heart block status post St. Jude dual-chamber pacemaker in May with followed by Dr. Lovena Le.  Medication Adjustments/Labs and Tests Ordered: Current medicines are reviewed at length with the patient today.  Concerns regarding medicines are outlined above.   Tests Ordered: No orders of the defined types were placed in this encounter.   Medication Changes: Meds ordered this encounter  Medications   bisoprolol (ZEBETA) 5 MG tablet    Sig: Take 0.5 tablets (2.5 mg total) by mouth daily.    Dispense:  45 tablet    Refill:  3     Disposition:  Follow up  6 to 8 weeks.  Signed, [name], MD, [facility] 12/18/2020 11:27 AM    [facility] [place], [place], [place] [address] Phone: [phone]; Fax: [phone]

## 2021-01-08 DIAGNOSIS — E871 Hypo-osmolality and hyponatremia: Secondary | ICD-10-CM | POA: Diagnosis not present

## 2021-01-08 DIAGNOSIS — I5022 Chronic systolic (congestive) heart failure: Secondary | ICD-10-CM | POA: Diagnosis not present

## 2021-01-12 ENCOUNTER — Telehealth: Payer: Self-pay | Admitting: Cardiology

## 2021-01-12 MED ORDER — EMPAGLIFLOZIN 10 MG PO TABS: 10.0000 mg | ORAL_TABLET | Freq: Every day | ORAL | 11 refills | Status: DC

## 2021-01-12 NOTE — Telephone Encounter (Signed)
Spoke with pt and Dr. Myles Gip note given. Pt will start Jardiance 10 mg Daily depending on cost.

## 2021-01-12 NOTE — Telephone Encounter (Signed)
Pt c/o Shortness Of Breath: STAT if SOB developed within the last 24 hours or pt is noticeably SOB on the phone  1. Are you currently SOB (can you hear that pt is SOB on the phone)?  YES, PT HAS BEEN WAKING UP SOB FOR SEVERAL WEEKS  2. How long have you been experiencing SOB? SEVERAL WEEKS  3. Are you SOB when sitting or when up moving around? PT WAKES UP  IN THE MORNING SOB  4. Are you currently experiencing any other symptoms? NO

## 2021-01-12 NOTE — Telephone Encounter (Signed)
Pt state that when he wakes in the morning that he is SOB. This has been going on for the last 1 1/2 weeks. Pt state that after being up for an hour the SOB goes away. He does sleep with the head of his bed elevated and with a larger pillow. He is currently taking lasix 40 mg alternating with 20 mg Daily. Today's weight is 194 lbs. No other complaints at this time. Please advise.

## 2021-01-15 ENCOUNTER — Ambulatory Visit (INDEPENDENT_AMBULATORY_CARE_PROVIDER_SITE_OTHER): Payer: PPO

## 2021-01-15 ENCOUNTER — Ambulatory Visit
Admission: EM | Admit: 2021-01-15 | Discharge: 2021-01-15 | Disposition: A | Discharge status: Home / Self Care | Payer: PPO | Attending: Urgent Care | Admitting: Urgent Care

## 2021-01-15 ENCOUNTER — Other Ambulatory Visit: Payer: Self-pay

## 2021-01-15 DIAGNOSIS — R062 Wheezing: Secondary | ICD-10-CM | POA: Diagnosis not present

## 2021-01-15 DIAGNOSIS — R051 Acute cough: Secondary | ICD-10-CM | POA: Diagnosis not present

## 2021-01-15 DIAGNOSIS — Z8679 Personal history of other diseases of the circulatory system: Secondary | ICD-10-CM

## 2021-01-15 DIAGNOSIS — I4891 Unspecified atrial fibrillation: Secondary | ICD-10-CM

## 2021-01-15 DIAGNOSIS — J988 Other specified respiratory disorders: Secondary | ICD-10-CM

## 2021-01-15 DIAGNOSIS — B9789 Other viral agents as the cause of diseases classified elsewhere: Secondary | ICD-10-CM

## 2021-01-15 DIAGNOSIS — Z8709 Personal history of other diseases of the respiratory system: Secondary | ICD-10-CM | POA: Diagnosis not present

## 2021-01-15 DIAGNOSIS — R059 Cough, unspecified: Secondary | ICD-10-CM | POA: Diagnosis not present

## 2021-01-15 DIAGNOSIS — I509 Heart failure, unspecified: Secondary | ICD-10-CM

## 2021-01-15 MED ORDER — BENZONATATE 100 MG PO CAPS: 100.0000 mg | ORAL_CAPSULE | Freq: Three times a day (TID) | ORAL | 0 refills | Status: DC | PRN

## 2021-01-15 MED ORDER — PREDNISONE 20 MG PO TABS: 20.0000 mg | ORAL_TABLET | Freq: Every day | ORAL | 0 refills | Status: DC

## 2021-01-15 MED ORDER — PROMETHAZINE-DM 6.25-15 MG/5ML PO SYRP: 5.0000 mL | ORAL_SOLUTION | Freq: Every evening | ORAL | 0 refills | Status: DC | PRN

## 2021-01-15 NOTE — ED Provider Notes (Signed)
[place]   MRN: [id number] DOB: December 10, 1940  Subjective:   Grant Stevens is a 81 y.o. male presenting for 1-2 week history of persistent coughing.  No active chest pain, shortness of breath or wheezing.  Patient's cough has been productive.  Would like a COVID test.  Has been using cough medications over-the-counter.  Patient has a history of smoking.  Has a history of asthma but has not needed his inhaler for a while.  Patient also has a history of heart disease, CABG, complete heart block.  He has a pacemaker.  No defibrillator.  He also has a history of atrial fibrillation.  He is a diabetic patient treated without insulin.  Last echocardiogram showed an ejection fraction of 20 to 25%.  No current facility-administered medications for this encounter.  Current Outpatient Medications:    aspirin EC 81 MG tablet, Take 1 tablet (81 mg total) by mouth daily., Disp: , Rfl:    atorvastatin (LIPITOR) 80 MG tablet, Take 80 mg by mouth at bedtime.  , Disp: , Rfl:    bisoprolol (ZEBETA) 5 MG tablet, Take 0.5 tablets (2.5 mg total) by mouth daily., Disp: 45 tablet, Rfl: 3   carbamazepine (TEGRETOL) 200 MG tablet, Take 400 mg by mouth 2 (two) times daily. , Disp: , Rfl:    divalproex (DEPAKOTE ER) 250 MG 24 hr tablet, Take 250 mg by mouth in the morning and at bedtime., Disp: , Rfl:    empagliflozin (JARDIANCE) 10 MG TABS tablet, Take 1 tablet (10 mg total) by mouth daily before breakfast., Disp: 30 tablet, Rfl: 11   famotidine (PEPCID) 40 MG tablet, TAKE (1) TABLET BY MOUTH AT BEDTIME., Disp: 30 tablet, Rfl: 5   furosemide (LASIX) 40 MG tablet, Take Lasix 40 mg alternating with 20 mg Daily, Disp: 45 tablet, Rfl: 11   LORazepam (ATIVAN) 0.5 MG tablet, Take 0.5 mg by mouth at bedtime., Disp: , Rfl:    Psyllium 0.36 g CAPS, Take 2 capsules by mouth daily., Disp: , Rfl:    sacubitril-valsartan (ENTRESTO) 24-26 MG, Take 1 tablet by mouth 2 (two) times daily., Disp: 60 tablet, Rfl: 11    tamsulosin (FLOMAX) 0.4 MG CAPS capsule, Take 1 capsule (0.4 mg total) by mouth at bedtime., Disp: 90 capsule, Rfl: 3   temazepam (RESTORIL) 15 MG capsule, Take 15 mg by mouth at bedtime as needed for sleep., Disp: , Rfl:    Allergies  Allergen Reactions   Spironolactone Other (See Comments)    Hyperkalemia    Past Medical History:  Diagnosis Date   Allergic rhinitis    Arthritis    Asthma    Childhood   Atrial fibrillation (HCC)    Remote history - WARCEF   Bladder cancer (HCC)    BPH (benign prostatic hyperplasia)    Cardiomyopathy (HCC)    CHF (congestive heart failure) (HCC)    Coronary atherosclerosis of native coronary artery    Multivessel s/p CABG in 06/2001 post-IMI, LVEF 35% up to 50% postoperatively;   Depression    Difficulty sleeping    Erectile dysfunction    Essential hypertension    Frequency of urination    History of bipolar disorder    Hyperlipidemia    IBS (irritable bowel syndrome)    Myocardial infarction ([place]) 2002     Past Surgical History:  Procedure Laterality Date   BIOPSY  03/28/2018   Procedure: BIOPSY;  Surgeon: Rogene Houston, MD;  [facility];  Service: Endoscopy;;  esophagus   BIV PACEMAKER INSERTION CRT-P N/A 05/12/2020   Procedure: BIV PACEMAKER INSERTION CRT-P;  Surgeon: Thompson Grayer, MD;  Location: [place];  Service: Cardiovascular;  Laterality: N/A;   CATARACT EXTRACTION W/PHACO Left 12/29/2014   Procedure: CATARACT EXTRACTION PHACO AND INTRAOCULAR LENS PLACEMENT (IOC);  Surgeon: Williams Che, MD;  Location: [facility];  Service: Ophthalmology;  Laterality: Left;  CDE:3.71   CATARACT EXTRACTION W/PHACO Right 03/30/2015   Procedure: CATARACT EXTRACTION PHACO AND INTRAOCULAR LENS PLACEMENT RIGHT EYE CDE=2.56;  Surgeon: Williams Che, MD;  Location: [facility];  Service: Ophthalmology;  Laterality: Right;   CIRCUMCISION  10/11/2003   COLONOSCOPY N/A 07/11/2012   rehman,transverse colon polyp (benign lymphoid);  tortuous colon; prep adequate with much suction/lavage; hemorrhoids.   CORONARY ARTERY BYPASS GRAFT  08/10/2000   Dr. Servando Snare - LIMA to LAD, SVG to diagonal, SVG to PDA TRIPLE BYPASS   CYSTOSCOPY W/ RETROGRADES Bilateral 03/02/2015   Procedure: CYSTOSCOPY WITH RIGHT RETROGRADE PYELOGRAM,  ATTEMPTED LEFT RETROGRADE PYELOGRAM;  Surgeon: Cleon Gustin, MD;  Location: [facility];  Service: Urology;  Laterality: Bilateral;   ESOPHAGEAL DILATION N/A 03/28/2018   Procedure: ESOPHAGEAL DILATION;  Surgeon: Rogene Houston, MD;  [facility];  Service: Endoscopy;  Laterality: N/A;   ESOPHAGOGASTRODUODENOSCOPY N/A 03/28/2018   rehman,Abnormal esophageal motility. mild schatzki ring at GEJ, dilated, patch of salmon colored mucosa at distal esophagus. Barrett's esophagus. 2cm HH. erosive gastropathy, normal pylorus, duodenal erosions w/o bleeding. normal second portion of duodenum   TEMPORARY PACEMAKER N/A 05/12/2020   Procedure: TEMPORARY PACEMAKER;  Surgeon: Martinique, Peter M, MD;  Location: [facility];  Service: Cardiovascular;  Laterality: N/A;   TONSILLECTOMY     TRANSURETHRAL RESECTION OF BLADDER TUMOR N/A 01/26/2015   Procedure: TRANSURETHRAL RESECTION OF BLADDER TUMOR (TURBT);  Surgeon: Cleon Gustin, MD;  Location[facility];  Service: Urology;  Laterality: N/A;   TRANSURETHRAL RESECTION OF BLADDER TUMOR WITH GYRUS (TURBT-GYRUS) N/A 03/02/2015   Procedure: TRANSURETHRAL RESECTION OF BLADDER TUMOR WITH GYRUS (TURBT-GYRUS);  Surgeon: Cleon Gustin, MD;  Location[facility];  Service: Urology;  Laterality: N/A;   TRANSURETHRAL RESECTION OF PROSTATE N/A 01/26/2015   Procedure: TRANSURETHRAL RESECTION OF THE PROSTATE WITH GYRUS INSTRUMENTS;  Surgeon: Cleon Gustin, MD;  Location: [facility];  Service: Urology;  Laterality: N/A;    Family History  Problem Relation Age of Onset   Asthma Mother    Heart attack Father 25   Diabetes Brother     Social History   Tobacco Use    Smoking status: Former    Packs/day: 1.00    Years: 15.00    Pack years: 15.00    Types: Cigarettes    Quit date: 01/11/1971    Years since quitting: 50.0   Smokeless tobacco: Never  Vaping Use   Vaping Use: Never used  Substance Use Topics   Alcohol use: Not Currently    Comment: occ.   Drug use: No    ROS   Objective:   Vitals: BP 124/73 (BP Location: Right Arm)    Pulse 60    Temp 97.8 F (36.6 C) (Oral)    Resp 16    SpO2 95%   Physical Exam Constitutional:      General: He is not in acute distress.    Appearance: Normal appearance. He is well-developed. He is not ill-appearing, toxic-appearing or diaphoretic.  HENT:     Head: Normocephalic and atraumatic.     Right  Ear: External ear normal.     Left Ear: External ear normal.     Nose: Nose normal.     Mouth/Throat:     Mouth: Mucous membranes are moist.     Pharynx: Oropharynx is clear.  Eyes:     General: No scleral icterus.       Right eye: No discharge.        Left eye: No discharge.     Extraocular Movements: Extraocular movements intact.     Conjunctiva/sclera: Conjunctivae normal.  Cardiovascular:     Rate and Rhythm: Normal rate and regular rhythm.     Heart sounds: Normal heart sounds. No murmur heard.   No friction rub. No gallop.  Pulmonary:     Effort: Pulmonary effort is normal. No respiratory distress.     Breath sounds: No stridor. Wheezing (over mid-lung fields bilaterally) present. No rhonchi or rales.  Neurological:     Mental Status: He is alert and oriented to person, place, and time.  Psychiatric:        Mood and Affect: Mood normal.        Behavior: Behavior normal.        Thought Content: Thought content normal.    DG Chest 2 View  Result Date: 01/15/2021 CLINICAL DATA:  Cough EXAM: CHEST - 2 VIEW COMPARISON:  Chest radiograph dated May 13, 2020 FINDINGS: The heart size and mediastinal contours are within normal limits. Evidence of prior CABG. Pacemaker leads in the right atrium and  right ventricle. Hyperinflated lungs with increase in AP diameter of the chest. No focal consolidation or pleural effusion. The visualized skeletal structures are unremarkable. Osteopenia and mild thoracic kyphosis. IMPRESSION: Hyperinflated lungs concerning for COPD. No focal consolidation or pleural effusion. Electronically Signed   By: Keane Police D.O.   On: 01/15/2021 11:16     Assessment and Plan :   PDMP not reviewed this encounter.  1. Viral respiratory illness   2. Acute cough   3. Wheezing   4. History of asthma   5. Congestive heart failure, unspecified HF chronicity, unspecified heart failure type ([place])   6. History of heart disease   7. Atrial fibrillation, unspecified type ([place])    Given the wheezing and the fact that the patient does not want to use an albuterol inhaler, recommended an oral prednisone course.  I will limit the dosing due to his CHF and ejection fraction.  Start 20 mg once daily for the next 5 days.  Use supportive care otherwise.  COVID-19 testing pending.  No signs of an acute pulmonary infection. Counseled patient on potential for adverse effects with medications prescribed/recommended today, ER and return-to-clinic precautions discussed, patient verbalized understanding.    Jaynee Eagles, PA-C 01/15/21 1140

## 2021-01-15 NOTE — Discharge Instructions (Addendum)
We will notify you of your test results as they arrive and may take between 48-72 hours.  I encourage you to sign up for MyChart if you have not already done so as this can be the easiest way for [place] to communicate results to you online or through a phone app.  Generally, we only contact you if it is a positive test result.  In the meantime, if you develop worsening symptoms including fever, chest pain, shortness of breath despite our current treatment plan then please report to the emergency room as this may be a sign of worsening status from possible viral infection.  Otherwise, we will manage this as a viral syndrome. For sore throat or cough try using a honey-based tea. Use 3 teaspoons of honey with juice squeezed from half lemon. Place shaved pieces of ginger into 1/2-1 cup of water and warm over stove top. Then mix the ingredients and repeat every 4 hours as needed. Please take Tylenol 500mg -650mg  every 6 hours for aches and pains, fevers. Hydrate very well with at least 2 liters of water. Eat light meals such as soups to replenish electrolytes and soft fruits, veggies. Start an antihistamine like Zyrtec for postnasal drainage, sinus congestion.  You can take this together with the prednisone (a steroid), cough medication.

## 2021-01-15 NOTE — ED Triage Notes (Signed)
Patient presents to Urgent Care with complaints of a productive cough x 1-2 weeks ago. Here for a covid test. Treating cough with cough drops.   Denies fever.

## 2021-01-16 LAB — NOVEL CORONAVIRUS, NAA

## 2021-01-29 ENCOUNTER — Other Ambulatory Visit (INDEPENDENT_AMBULATORY_CARE_PROVIDER_SITE_OTHER): Payer: Self-pay | Admitting: Gastroenterology

## 2021-02-02 ENCOUNTER — Encounter: Payer: Self-pay | Admitting: Cardiology

## 2021-02-02 DIAGNOSIS — F319 Bipolar disorder, unspecified: Secondary | ICD-10-CM | POA: Diagnosis not present

## 2021-02-02 DIAGNOSIS — I5022 Chronic systolic (congestive) heart failure: Secondary | ICD-10-CM | POA: Diagnosis not present

## 2021-02-02 DIAGNOSIS — Z79899 Other long term (current) drug therapy: Secondary | ICD-10-CM | POA: Diagnosis not present

## 2021-02-02 DIAGNOSIS — I442 Atrioventricular block, complete: Secondary | ICD-10-CM | POA: Diagnosis not present

## 2021-02-02 DIAGNOSIS — E871 Hypo-osmolality and hyponatremia: Secondary | ICD-10-CM | POA: Diagnosis not present

## 2021-02-08 DIAGNOSIS — I700 Atherosclerosis of aorta: Secondary | ICD-10-CM | POA: Diagnosis not present

## 2021-02-08 DIAGNOSIS — I5020 Unspecified systolic (congestive) heart failure: Secondary | ICD-10-CM | POA: Diagnosis not present

## 2021-02-08 DIAGNOSIS — D696 Thrombocytopenia, unspecified: Secondary | ICD-10-CM | POA: Diagnosis not present

## 2021-02-08 DIAGNOSIS — E785 Hyperlipidemia, unspecified: Secondary | ICD-10-CM | POA: Diagnosis not present

## 2021-02-10 ENCOUNTER — Ambulatory Visit (INDEPENDENT_AMBULATORY_CARE_PROVIDER_SITE_OTHER): Payer: PPO

## 2021-02-10 DIAGNOSIS — I442 Atrioventricular block, complete: Secondary | ICD-10-CM | POA: Diagnosis not present

## 2021-02-10 LAB — CUP PACEART REMOTE DEVICE CHECK
Battery Remaining Longevity: 94 mo
Battery Remaining Percentage: 95 %
Battery Voltage: 3.01 V
Brady Statistic AP VP Percent: 44 %
Brady Statistic AP VS Percent: 1 %
Brady Statistic AS VP Percent: 55 %
Brady Statistic AS VS Percent: 1 %
Brady Statistic RA Percent Paced: 44 %
Brady Statistic RV Percent Paced: 99 %
Date Time Interrogation Session: 20230201040018
Implantable Lead Implant Date: 20220503
Implantable Lead Implant Date: 20220503
Implantable Lead Location: 753859
Implantable Lead Location: 753860
Implantable Pulse Generator Implant Date: 20220503
Lead Channel Impedance Value: 360 Ohm
Lead Channel Impedance Value: 460 Ohm
Lead Channel Pacing Threshold Amplitude: 0.75 V
Lead Channel Pacing Threshold Amplitude: 0.75 V
Lead Channel Pacing Threshold Pulse Width: 0.5 ms
Lead Channel Pacing Threshold Pulse Width: 0.5 ms
Lead Channel Sensing Intrinsic Amplitude: 12 mV
Lead Channel Sensing Intrinsic Amplitude: 2.1 mV
Lead Channel Setting Pacing Amplitude: 2 V
Lead Channel Setting Pacing Amplitude: 2.5 V
Lead Channel Setting Pacing Pulse Width: 0.5 ms
Lead Channel Setting Sensing Sensitivity: 2 mV
Pulse Gen Model: 2272
Pulse Gen Serial Number: 3920512

## 2021-02-11 ENCOUNTER — Ambulatory Visit: Payer: PPO | Admitting: Student

## 2021-02-11 ENCOUNTER — Encounter: Payer: Self-pay | Admitting: Student

## 2021-02-11 ENCOUNTER — Other Ambulatory Visit: Payer: Self-pay

## 2021-02-11 VITALS — BP 124/64 | HR 60 | Ht 72.0 in | Wt 199.0 lb

## 2021-02-11 DIAGNOSIS — I2510 Atherosclerotic heart disease of native coronary artery without angina pectoris: Secondary | ICD-10-CM

## 2021-02-11 DIAGNOSIS — I5042 Chronic combined systolic (congestive) and diastolic (congestive) heart failure: Secondary | ICD-10-CM

## 2021-02-11 DIAGNOSIS — I442 Atrioventricular block, complete: Secondary | ICD-10-CM | POA: Diagnosis not present

## 2021-02-11 DIAGNOSIS — E785 Hyperlipidemia, unspecified: Secondary | ICD-10-CM | POA: Diagnosis not present

## 2021-02-11 DIAGNOSIS — I10 Essential (primary) hypertension: Secondary | ICD-10-CM | POA: Diagnosis not present

## 2021-02-11 NOTE — Patient Instructions (Signed)
Medication Instructions:  Your physician recommends that you continue on your current medications as directed. Please refer to the Current Medication list given to you today.  *If you need a refill on your cardiac medications before your next appointment, please call your pharmacy*  If you have labs (blood work) drawn today and your tests are completely normal, you will receive your results only by: [place] (if you have MyChart) OR A paper copy in the mail If you have any lab test that is abnormal or we need to change your treatment, we will call you to review the results.  Follow-Up: At [facility], you and your health needs are our priority.  As part of our continuing mission to provide you with exceptional heart care, we have created designated Provider Care Teams.  These Care Teams include your primary Cardiologist (physician) and Advanced Practice Providers (APPs -  Physician Assistants and Nurse Practitioners) who all work together to provide you with the care you need, when you need it.  We recommend signing up for the patient portal called "MyChart".  Sign up information is provided on this After Visit Summary.  MyChart is used to connect with patients for Virtual Visits (Telemedicine).  Patients are able to view lab/test results, encounter notes, upcoming appointments, etc.  Non-urgent messages can be sent to your provider as well.   To learn more about what you can do with MyChart, go to [email or web address].    Your next appointment:   3 month(s)  The format for your next appointment:   In Person  Provider:   Rozann Lesches, MD or Bernerd Pho, PA-C    Other Instructions Thank you for choosing [facility]!

## 2021-02-11 NOTE — Progress Notes (Signed)
Cardiology Office Note    Date:  02/11/2021   ID:  Grant Stevens, DOB 18-Mar-1940, MRN [id number]  PCP:  [name], MD  Cardiologist: Rozann Lesches, MD   EP: Dr. Lovena Le  Chief Complaint  Patient presents with   Follow-up    6 week visit    History of Present Illness:    Grant Stevens is a 81 y.o. male  with past medical history of CAD (s/p CABG in 2002, no ischemia by NST in 11/2013), chronic combined systolic and diastolic CHF/ICM (EF 97-67% by echo in 2018 and 10/2018, at 25-30% in 05/2020), SSS (s/p St. Jude Dual PPM in 05/2020), HTN and HLD who presents to the office today for 6-week follow-up.  He was last examined by Dr. Domenic Polite in 12/2020 and reported improvement in his dyspnea since being transitioned from Lisinopril to [facility]. He had previously not tolerated Aldactone due to hyperkalemia but was started on Bisoprolol 2.5 mg daily. Close follow-up was arranged to consider further titration of medical therapy and to consider the possibility of a R/LHC if symptoms worsened despite medical therapy.   He called the office on 01/12/2021 reporting worsening orthopnea and he was taking Lasix 40 mg daily alternating with 20 mg daily. He was started on Jardiance 10 mg daily. Was also evaluated at Urgent Care on 01/15/2021 for wheezing and was overall felt to have a viral illness. He was started on a short course of Prednisone.  In talking with the patient today, he reports "doing great" as compared to how he felt last month.  He has been going to [employer] several days a week and walking on the treadmill along with lifting weights and denies any anginal symptoms with this. No recent chest pain, dyspnea on exertion, orthopnea, PND or pitting edema.  Past Medical History:  Diagnosis Date   Allergic rhinitis    Arthritis    Asthma    Childhood   Atrial fibrillation ([place])    Remote history - WARCEF   Bladder cancer (HCC)    BPH (benign prostatic hyperplasia)     Cardiomyopathy (HCC)    CHF (congestive heart failure) (HCC)    Coronary atherosclerosis of native coronary artery    Multivessel s/p CABG in 06/2001 post-IMI, LVEF 35% up to 50% postoperatively;   Depression    Difficulty sleeping    Erectile dysfunction    Essential hypertension    Frequency of urination    History of bipolar disorder    Hyperlipidemia    IBS (irritable bowel syndrome)    Myocardial infarction ([place]) 2002    Past Surgical History:  Procedure Laterality Date   BIOPSY  03/28/2018   Procedure: BIOPSY;  Surgeon: Rogene Houston, MD;  Location: [facility];  Service: Endoscopy;;  esophagus   BIV PACEMAKER INSERTION CRT-P N/A 05/12/2020   Procedure: BIV PACEMAKER INSERTION CRT-P;  Surgeon: Thompson Grayer, MD;  Location: [place] [facility];  Service: Cardiovascular;  Laterality: N/A;   CATARACT EXTRACTION W/PHACO Left 12/29/2014   Procedure: CATARACT EXTRACTION PHACO AND INTRAOCULAR LENS PLACEMENT (IOC);  Surgeon: Williams Che, MD;  Location: [facility];  Service: Ophthalmology;  Laterality: Left;  CDE:3.71   CATARACT EXTRACTION W/PHACO Right 03/30/2015   Procedure: CATARACT EXTRACTION PHACO AND INTRAOCULAR LENS PLACEMENT RIGHT EYE CDE=2.56;  Surgeon: Williams Che, MD;  Location: [facility];  Service: Ophthalmology;  Laterality: Right;   CIRCUMCISION  10/11/2003   COLONOSCOPY N/A 07/11/2012   rehman,transverse colon polyp (benign lymphoid);  tortuous colon; prep adequate with much suction/lavage; hemorrhoids.   CORONARY ARTERY BYPASS GRAFT  08/10/2000   Dr. Servando Snare - LIMA to LAD, SVG to diagonal, SVG to PDA TRIPLE BYPASS   CYSTOSCOPY W/ RETROGRADES Bilateral 03/02/2015   Procedure: CYSTOSCOPY WITH RIGHT RETROGRADE PYELOGRAM,  ATTEMPTED LEFT RETROGRADE PYELOGRAM;  Surgeon: Cleon Gustin, MD;  Location: [facility];  Service: Urology;  Laterality: Bilateral;   ESOPHAGEAL DILATION N/A 03/28/2018   Procedure: ESOPHAGEAL DILATION;  Surgeon: Rogene Houston, MD;  Location: [facility];  Service: Endoscopy;  Laterality: N/A;   ESOPHAGOGASTRODUODENOSCOPY N/A 03/28/2018   rehman,Abnormal esophageal motility. mild schatzki ring at GEJ, dilated, patch of salmon colored mucosa at distal esophagus. Barrett's esophagus. 2cm HH. erosive gastropathy, normal pylorus, duodenal erosions w/o bleeding. normal second portion of duodenum   TEMPORARY PACEMAKER N/A 05/12/2020   Procedure: TEMPORARY PACEMAKER;  Surgeon: Martinique, Peter M, MD;  Location: [facility];  Service: Cardiovascular;  Laterality: N/A;   TONSILLECTOMY     TRANSURETHRAL RESECTION OF BLADDER TUMOR N/A 01/26/2015   Procedure: TRANSURETHRAL RESECTION OF BLADDER TUMOR (TURBT);  Surgeon: Cleon Gustin, MD;  Location[facility];  Service: Urology;  Laterality: N/A;   TRANSURETHRAL RESECTION OF BLADDER TUMOR WITH GYRUS (TURBT-GYRUS) N/A 03/02/2015   Procedure: TRANSURETHRAL RESECTION OF BLADDER TUMOR WITH GYRUS (TURBT-GYRUS);  Surgeon: Cleon Gustin, MD;  Location: [facility];  Service: Urology;  Laterality: N/A;   TRANSURETHRAL RESECTION OF PROSTATE N/A 01/26/2015   Procedure: TRANSURETHRAL RESECTION OF THE PROSTATE WITH GYRUS INSTRUMENTS;  Surgeon: Cleon Gustin, MD;  Location: [facility];  Service: Urology;  Laterality: N/A;    Current Medications: Outpatient Medications Prior to Visit  Medication Sig Dispense Refill   aspirin EC 81 MG tablet Take 1 tablet (81 mg total) by mouth daily.     atorvastatin (LIPITOR) 80 MG tablet Take 80 mg by mouth at bedtime.       bisoprolol (ZEBETA) 5 MG tablet Take 0.5 tablets (2.5 mg total) by mouth daily. 45 tablet 3   carbamazepine (TEGRETOL) 200 MG tablet Take 400 mg by mouth 2 (two) times daily.      divalproex (DEPAKOTE ER) 250 MG 24 hr tablet Take 250 mg by mouth in the morning and at bedtime.     empagliflozin (JARDIANCE) 10 MG TABS tablet Take 1 tablet (10 mg total) by mouth daily before breakfast. 30 tablet 11   famotidine (PEPCID) 40 MG tablet TAKE (1) TABLET  BY MOUTH AT BEDTIME. 30 tablet 5   furosemide (LASIX) 40 MG tablet Take Lasix 40 mg alternating with 20 mg Daily 45 tablet 11   LORazepam (ATIVAN) 0.5 MG tablet Take 0.5 mg by mouth at bedtime.     psyllium (REGULOID) 0.52 g capsule Take 0.52 g by mouth daily.     Psyllium 0.36 g CAPS Take 2 capsules by mouth daily.     sacubitril-valsartan (ENTRESTO) 24-26 MG Take 1 tablet by mouth 2 (two) times daily. 60 tablet 11   tamsulosin (FLOMAX) 0.4 MG CAPS capsule Take 1 capsule (0.4 mg total) by mouth at bedtime. 90 capsule 3   promethazine-dextromethorphan (PROMETHAZINE-DM) 6.25-15 MG/5ML syrup Take 5 mLs by mouth at bedtime as needed for cough. (Patient not taking: Reported on 02/11/2021) 100 mL 0   temazepam (RESTORIL) 15 MG capsule Take 15 mg by mouth at bedtime as needed for sleep. (Patient not taking: Reported on 02/11/2021)     benzonatate (TESSALON) 100 MG capsule Take 1-2 capsules (100-200 mg  total) by mouth 3 (three) times daily as needed for cough. (Patient not taking: Reported on 02/11/2021) 60 capsule 0   predniSONE (DELTASONE) 20 MG tablet Take 1 tablet (20 mg total) by mouth daily with breakfast. (Patient not taking: Reported on 02/11/2021) 5 tablet 0   No facility-administered medications prior to visit.     Allergies:   Spironolactone   Social History   Socioeconomic History   Marital status: Widowed    Spouse name: Not on file   Number of children: Not on file   Years of education: Not on file   Highest education level: Not on file  Occupational History   Not on file  Tobacco Use   Smoking status: Former    Packs/day: 1.00    Years: 15.00    Pack years: 15.00    Types: Cigarettes    Quit date: 01/11/1971    Years since quitting: 50.1   Smokeless tobacco: Never  Vaping Use   Vaping Use: Never used  Substance and Sexual Activity   Alcohol use: Not Currently    Comment: occ.   Drug use: No   Sexual activity: Not on file  Other Topics Concern   Not on file  Social History  Narrative   Married with no children    Exercises 4 times weekly   Caffeine once a day   Social Determinants of Health   Financial Resource Strain: Not on file  Food Insecurity: Not on file  Transportation Needs: Not on file  Physical Activity: Not on file  Stress: Not on file  Social Connections: Not on file     Family History:  The patient's family history includes Asthma in his mother; Diabetes in his brother; Heart attack (age of onset: 61) in his father.   Review of Systems:    Please see the history of present illness.     All other systems reviewed and are otherwise negative except as noted above.   Physical Exam:    VS:  BP 124/64    Pulse 60    Ht 6' (1.829 m)    Wt 199 lb (90.3 kg)    SpO2 98%    BMI 26.99 kg/m    General: Pleasant elderly male appearing in no acute distress. Head: Normocephalic, atraumatic. Neck: No carotid bruits. JVD not elevated.  Lungs: Respirations regular and unlabored, without wheezes or rales.  Heart: Regular rate and rhythm. No S3 or S4.  No murmur, no rubs, or gallops appreciated. Abdomen: Appears non-distended. No obvious abdominal masses. Msk:  Strength and tone appear normal for age. No obvious joint deformities or effusions. Extremities: No clubbing or cyanosis. No pitting edema.  Distal pedal pulses are 2+ bilaterally. Neuro: Alert and oriented X 3. Moves all extremities spontaneously. No focal deficits noted. Psych:  Responds to questions appropriately with a normal affect. Skin: No rashes or lesions noted  Wt Readings from Last 3 Encounters:  02/11/21 199 lb (90.3 kg)  12/18/20 195 lb 6.4 oz (88.6 kg)  11/23/20 198 lb 6.4 oz (90 kg)     Studies/Labs Reviewed:   EKG:  EKG is not ordered today.   Recent Labs: 05/12/2020: ALT 102; Hemoglobin 13.9; Platelets 159 11/30/2020: BUN 16; Creatinine, Ser 0.98; Potassium 4.9; Sodium 127   Lipid Panel    Component Value Date/Time   CHOL 145 05/12/2020 1101   TRIG 79 05/12/2020  1101   HDL 48 05/12/2020 1101   CHOLHDL 3.0 05/12/2020 1101   VLDL 16 05/12/2020  [place] 05/12/2020 1101    Additional studies/ records that were reviewed today include:   Limited Echo: 05/2020 IMPRESSIONS     1. Left ventricular ejection fraction, by estimation, is 25 to 30%. The  left ventricle has severely decreased function. The left ventricle  demonstrates global hypokinesis. There is moderate left ventricular  hypertrophy.   2. Right ventricular systolic function is normal. The right ventricular  size is mildly enlarged. There is normal pulmonary artery systolic  pressure. The estimated right ventricular systolic pressure is 67.8 mmHg.   3. Left atrial size was moderately dilated.   4. Right atrial size was mildly dilated.   5. The mitral valve is degenerative. Mild mitral valve regurgitation. No  evidence of mitral stenosis. Moderate mitral annular calcification.   6. The aortic valve is tricuspid. Aortic valve regurgitation is mild.  Mild to moderate aortic valve sclerosis/calcification is present, without  any evidence of aortic stenosis.   7. The inferior vena cava is normal in size with greater than 50%  respiratory variability, suggesting right atrial pressure of 3 mmHg.   Comparison(s): No significant change from prior study. Prior images  reviewed side by side.   Assessment:    1. Chronic combined systolic and diastolic heart failure ([place])   2. Coronary artery disease involving native coronary artery of native heart without angina pectoris   3. Heart block AV complete ([place])   4. Essential hypertension   5. Hyperlipidemia LDL goal <70      Plan:   In order of problems listed above:  1. HFrEF - His EF was at 25-30% by most recent echocardiogram in 05/2020. He reports significant improvement in his dyspnea since being treated with steroids last month and also being started on Jardiance. He denies any recurrent dyspnea on exertion, orthopnea, PND or  lower extremity edema. His weight has been stable on his home scales. - Continue current medication regimen with Bisoprolol 2.5 mg daily, Jardiance 10 mg daily, Entresto 24-26 mg twice daily and Lasix 40 mg daily alternating with 20 mg daily. His creatinine was stable at 1.19 when checked last month. I did not further titrate Bisoprolol today given HR in the 50's to 60's and would not further titrate Entresto as his SBP is typically in the low-100's to 110's at home.   2. CAD - He is s/p CABG in 2002 with most recent ischemic evaluation being a NST in 2015 which showed no ischemia. He remains active at baseline and denies any recent anginal symptoms. I encouraged him to make [place] aware of any new symptoms as repeat ischemic evaluation could be arranged given his known cardiomyopathy and the timeframe since last evaluation. - Continue current medication regimen with ASA 81 mg daily, Atorvastatin 80 mg daily and Bisoprolol 2.5 mg daily.  3. SSS - He is s/p St. Jude Dual PPM in 05/2020 which is followed by Dr. Lovena Le. Most recent device interrogation yesterday showed normal device function.  4. HTN - His blood pressure is well controlled at 124/64 during today's visit. Continue current medication regimen with Bisoprolol, Entresto and Lasix.  5. HLD  - Recent labs showed his LDL was elevated at 104 but this was previously in the 70's to 80's in 2022. He reports having switched the time that he was taking Atorvastatin from evening into the morning hours and has also been consuming more gravy at [employer]. Reviewed options and he will restart taking Atorvastatin in the evening hours and try  to reduce his intake of high cholesterol foods such as gravy and butter. If LDL remains above goal, would consider switching from Atorvastatin 80 mg daily to Crestor 40 mg daily or adding Zetia.    Medication Adjustments/Labs and Tests Ordered: Current medicines are reviewed at length with the patient today.   Concerns regarding medicines are outlined above.  Medication changes, Labs and Tests ordered today are listed in the Patient Instructions below. Patient Instructions  Medication Instructions:  Your physician recommends that you continue on your current medications as directed. Please refer to the Current Medication list given to you today.  *If you need a refill on your cardiac medications before your next appointment, please call your pharmacy*  If you have labs (blood work) drawn today and your tests are completely normal, you will receive your results only by: [place] (if you have MyChart) OR A paper copy in the mail If you have any lab test that is abnormal or we need to change your treatment, we will call you to review the results.  Follow-Up: At [facility], you and your health needs are our priority.  As part of our continuing mission to provide you with exceptional heart care, we have created designated Provider Care Teams.  These Care Teams include your primary Cardiologist (physician) and Advanced Practice Providers (APPs -  Physician Assistants and Nurse Practitioners) who all work together to provide you with the care you need, when you need it.  We recommend signing up for the patient portal called "MyChart".  Sign up information is provided on this After Visit Summary.  MyChart is used to connect with patients for Virtual Visits (Telemedicine).  Patients are able to view lab/test results, encounter notes, upcoming appointments, etc.  Non-urgent messages can be sent to your provider as well.   To learn more about what you can do with MyChart, go to [email or web address].    Your next appointment:   3 month(s)  The format for your next appointment:   In Person  Provider:   Rozann Lesches, MD or Bernerd Pho, PA-C    Other Instructions Thank you for choosing [facility]!      Signed, Erma Heritage, PA-C  02/11/2021 2:14 PM    [place] [address] [place], [place] [address] Phone: [phone] Fax: [phone]

## 2021-02-17 NOTE — Progress Notes (Signed)
Remote pacemaker transmission.   

## 2021-02-18 ENCOUNTER — Ambulatory Visit (INDEPENDENT_AMBULATORY_CARE_PROVIDER_SITE_OTHER): Payer: PPO | Admitting: Gastroenterology

## 2021-02-18 ENCOUNTER — Encounter (INDEPENDENT_AMBULATORY_CARE_PROVIDER_SITE_OTHER): Payer: Self-pay | Admitting: Gastroenterology

## 2021-02-18 ENCOUNTER — Encounter (INDEPENDENT_AMBULATORY_CARE_PROVIDER_SITE_OTHER): Payer: Self-pay

## 2021-02-18 ENCOUNTER — Other Ambulatory Visit: Payer: Self-pay

## 2021-02-18 ENCOUNTER — Other Ambulatory Visit (INDEPENDENT_AMBULATORY_CARE_PROVIDER_SITE_OTHER): Payer: Self-pay

## 2021-02-18 VITALS — BP 90/54 | HR 65 | Temp 97.8°F | Ht 72.0 in | Wt 196.2 lb

## 2021-02-18 DIAGNOSIS — K2270 Barrett's esophagus without dysplasia: Secondary | ICD-10-CM

## 2021-02-18 DIAGNOSIS — K219 Gastro-esophageal reflux disease without esophagitis: Secondary | ICD-10-CM

## 2021-02-18 DIAGNOSIS — K5904 Chronic idiopathic constipation: Secondary | ICD-10-CM

## 2021-02-18 DIAGNOSIS — K5900 Constipation, unspecified: Secondary | ICD-10-CM | POA: Insufficient documentation

## 2021-02-18 DIAGNOSIS — Z01812 Encounter for preprocedural laboratory examination: Secondary | ICD-10-CM

## 2021-02-18 NOTE — Progress Notes (Signed)
Grant Stevens, M.D. Gastroenterology & Hepatology [facility] [address] [place], [place] [address]  Primary Care Physician: [name], MD [address] [place] [address]  I will communicate my assessment and recommendations to the referring MD via EMR.  Problems: GERD Short segment Barrett's esophagus Chronic idiopathic constipation  History of Present Illness: Grant Stevens is a 81 y.o. male  with pmh of localized bladder cancer s/p BCG injection, MI s/p CABG, asthma, BPH, cardiomyopathy, Heart failure, HTN, bipolar disorder, HLD, Barrett's esophagus, and IBS, who presents for follow up of constipation, GERD and Barrett's esophagus  The patient was last seen on 08/18/2018 to. At that time, the patient was advised to continue taking Metamucil for constipation.  He also underwent an abdominal x-ray that showed mild constipation for which she was counseled to take MiraLAX.  However, he is currently only taking Metamucil.  Patient reports that he has been moving his bowels every other day.  Denies having any abdominal pain and reports that his bowel movements are frequent.  Has been taking Metamucil compliantly daily.  Has been taking Pepcid 40 mg every night compliantly. Not on a PPI. Denies heartburn, odynophagia or dysphagia. He reports occasionally he chokes when chewing fast, so he has to take some time to eat. Has not modified his diet at all.  The patient denies having any nausea, vomiting, fever, chills, hematochezia, melena, hematemesis, abdominal distention, abdominal pain, diarrhea, jaundice, pruritus. Lost 5 lb since last visit as he was skipping lunch.  Last EGD: 2020 - Abnormal esophageal motility. - Mild Schatzki ring at GEJ(39).  This was dilated with the scope. - Patch of salmon colored mucosa at distal esophagus. Biopsied. - 2 cm hiatal hernia. - Erosive gastropathy. - Normal pylorus. - Duodenal  erosions without bleeding. - Normal second portion of the duodenum. Last Colonoscopy:2014 Examination performed to cecum. Quality of prep somewhat compromised examination of ascending colon and cecum. Redundant colon. Small polyp ablated via cold biopsy from proximal transverse colon - benign lymph node. Small external hemorrhoids.  Recommended repeat colonoscopy in 10 years  Past Medical History: Past Medical History:  Diagnosis Date   Allergic rhinitis    Arthritis    Asthma    Childhood   Atrial fibrillation ([place])    Remote history - WARCEF   Bladder cancer (Grant Stevens)    BPH (benign prostatic hyperplasia)    Cardiomyopathy (HCC)    CHF (congestive heart failure) (HCC)    Coronary atherosclerosis of native coronary artery    Multivessel s/p CABG in 2002 post-IMI, LVEF 35% up to 50% postoperatively;   Depression    Difficulty sleeping    Erectile dysfunction    Essential hypertension    Frequency of urination    History of bipolar disorder    Hyperlipidemia    IBS (irritable bowel syndrome)    Myocardial infarction ([place]) 2002    Past Surgical History: Past Surgical History:  Procedure Laterality Date   BIOPSY  03/28/2018   Procedure: BIOPSY;  Surgeon: Rogene Houston, MD;  [facility];  Service: Endoscopy;;  esophagus   BIV PACEMAKER INSERTION CRT-P N/A 05/12/2020   Procedure: BIV PACEMAKER INSERTION CRT-P;  Surgeon: Thompson Grayer, MD;  Location: [facility];  Service: Cardiovascular;  Laterality: N/A;   CATARACT EXTRACTION W/PHACO Left 12/29/2014   Procedure: CATARACT EXTRACTION PHACO AND INTRAOCULAR LENS PLACEMENT (IOC);  Surgeon: Williams Che, MD;  Location: [facility];  Service: Ophthalmology;  Laterality: Left;  CDE:3.71   CATARACT EXTRACTION W/PHACO Right 03/30/2015   Procedure: CATARACT EXTRACTION PHACO AND INTRAOCULAR LENS PLACEMENT RIGHT EYE CDE=2.56;  Surgeon: Williams Che, MD;  Location: [facility];  Service: Ophthalmology;  Laterality: Right;    CIRCUMCISION  10/11/2003   COLONOSCOPY N/A 07/11/2012   rehman,transverse colon polyp (benign lymphoid); tortuous colon; prep adequate with much suction/lavage; hemorrhoids.   CORONARY ARTERY BYPASS GRAFT  08/10/2000   Dr. Servando Snare - LIMA to LAD, SVG to diagonal, SVG to PDA TRIPLE BYPASS   CYSTOSCOPY W/ RETROGRADES Bilateral 03/02/2015   Procedure: CYSTOSCOPY WITH RIGHT RETROGRADE PYELOGRAM,  ATTEMPTED LEFT RETROGRADE PYELOGRAM;  Surgeon: Cleon Gustin, MD;  [facility];  Service: Urology;  Laterality: Bilateral;   ESOPHAGEAL DILATION N/A 03/28/2018   Procedure: ESOPHAGEAL DILATION;  Surgeon: Rogene Houston, MD;  [facility];  Service: Endoscopy;  Laterality: N/A;   ESOPHAGOGASTRODUODENOSCOPY N/A 03/28/2018   rehman,Abnormal esophageal motility. mild schatzki ring at GEJ, dilated, patch of salmon colored mucosa at distal esophagus. Barrett's esophagus. 2cm HH. erosive gastropathy, normal pylorus, duodenal erosions w/o bleeding. normal second portion of duodenum   TEMPORARY PACEMAKER N/A 05/12/2020   Procedure: TEMPORARY PACEMAKER;  Surgeon: Martinique, Peter M, MD;  Location: [place] [facility];  Service: Cardiovascular;  Laterality: N/A;   TONSILLECTOMY     TRANSURETHRAL RESECTION OF BLADDER TUMOR N/A 01/26/2015   Procedure: TRANSURETHRAL RESECTION OF BLADDER TUMOR (TURBT);  Surgeon: Cleon Gustin, MD;  [facility];  Service: Urology;  Laterality: N/A;   TRANSURETHRAL RESECTION OF BLADDER TUMOR WITH GYRUS (TURBT-GYRUS) N/A 03/02/2015   Procedure: TRANSURETHRAL RESECTION OF BLADDER TUMOR WITH GYRUS (TURBT-GYRUS);  Surgeon: Cleon Gustin, MD;  [facility];  Service: Urology;  Laterality: N/A;   TRANSURETHRAL RESECTION OF PROSTATE N/A 01/26/2015   Procedure: TRANSURETHRAL RESECTION OF THE PROSTATE WITH GYRUS INSTRUMENTS;  Surgeon: Cleon Gustin, MD;  [facility];  Service: Urology;  Laterality: N/A;    Family History: Family History  Problem  Relation Age of Onset   Asthma Mother    Heart attack Father 24   Diabetes Brother     Social History: Social History   Tobacco Use  Smoking Status Former   Packs/day: 1.00   Years: 15.00   Pack years: 15.00   Types: Cigarettes   Quit date: 01/11/1971   Years since quitting: 50.1  Smokeless Tobacco Never   Social History   Substance and Sexual Activity  Alcohol Use Not Currently   Comment: occ.   Social History   Substance and Sexual Activity  Drug Use No    Allergies: Allergies  Allergen Reactions   Spironolactone Other (See Comments)    Hyperkalemia    Medications: Current Outpatient Medications  Medication Sig Dispense Refill   aspirin EC 81 MG tablet Take 1 tablet (81 mg total) by mouth daily.     atorvastatin (LIPITOR) 80 MG tablet Take 80 mg by mouth at bedtime.       bisoprolol (ZEBETA) 5 MG tablet Take 0.5 tablets (2.5 mg total) by mouth daily. 45 tablet 3   carbamazepine (TEGRETOL) 200 MG tablet Take 400 mg by mouth 2 (two) times daily.      divalproex (DEPAKOTE ER) 250 MG 24 hr tablet Take 250 mg by mouth in the morning and at bedtime.     empagliflozin (JARDIANCE) 10 MG TABS tablet Take 1 tablet (10 mg total) by mouth daily before breakfast. 30 tablet 11   famotidine (PEPCID)  40 MG tablet TAKE (1) TABLET BY MOUTH AT BEDTIME. 30 tablet 5   furosemide (LASIX) 40 MG tablet Take Lasix 40 mg alternating with 20 mg Daily 45 tablet 11   LORazepam (ATIVAN) 0.5 MG tablet Take 0.5 mg by mouth at bedtime.     Psyllium 0.36 g CAPS Take 2 capsules by mouth daily.     sacubitril-valsartan (ENTRESTO) 24-26 MG Take 1 tablet by mouth 2 (two) times daily. 60 tablet 11   tamsulosin (FLOMAX) 0.4 MG CAPS capsule Take 1 capsule (0.4 mg total) by mouth at bedtime. 90 capsule 3   temazepam (RESTORIL) 15 MG capsule Take 15 mg by mouth at bedtime as needed for sleep.     No current facility-administered medications for this visit.    Review of Systems: GENERAL: negative for  malaise, night sweats HEENT: No changes in hearing or vision, no nose bleeds or other nasal problems. NECK: Negative for lumps, goiter, pain and significant neck swelling RESPIRATORY: Negative for cough, wheezing CARDIOVASCULAR: Negative for chest pain, leg swelling, palpitations, orthopnea GI: SEE HPI MUSCULOSKELETAL: Negative for joint pain or swelling, back pain, and muscle pain. SKIN: Negative for lesions, rash PSYCH: Negative for sleep disturbance, mood disorder and recent psychosocial stressors. HEMATOLOGY Negative for prolonged bleeding, bruising easily, and swollen nodes. ENDOCRINE: Negative for cold or heat intolerance, polyuria, polydipsia and goiter. NEURO: negative for tremor, gait imbalance, syncope and seizures. The remainder of the review of systems is noncontributory.   Physical Exam: BP (!) 90/54 (BP Location: Left Arm, Patient Position: Sitting, Cuff Size: Large)    Pulse 65    Temp 97.8 F (36.6 C) (Oral)    Ht 6' (1.829 m)    Wt 196 lb 3.2 oz (89 kg)    BMI 26.61 kg/m  GENERAL: The patient is AO x3, in no acute distress. HEENT: Head is normocephalic and atraumatic. EOMI are intact. Mouth is well hydrated and without lesions. NECK: Supple. No masses LUNGS: Clear to auscultation. No presence of rhonchi/wheezing/rales. Adequate chest expansion HEART: RRR, normal s1 and s2. ABDOMEN: Soft, nontender, no guarding, no peritoneal signs, and nondistended. BS +. No masses. EXTREMITIES: Without any cyanosis, clubbing, rash, lesions or edema. NEUROLOGIC: AOx3, no focal motor deficit. SKIN: no jaundice, no rashes  Imaging/Labs: as above  I personally reviewed and interpreted the available labs, imaging and endoscopic files.  Impression and Plan: Grant Stevens is a 81 y.o. male  with pmh of localized bladder cancer s/p BCG injection, MI s/p CABG, asthma, BPH, cardiomyopathy, Heart failure, HTN, bipolar disorder, HLD, Barrett's esophagus, and IBS, who presents for follow up  of constipation, GERD and Barrett's esophagus.  The patient has presented adequate control of his reflux symptoms and has not presented any more dysphagia.  He is due for surveillance of his Barrett's esophagus, he will be scheduled for EGD with biopsies following [place] protocol.  If there is evidence of active inflammation or recurrent stricture, he will be switched to a PPI.  For now, he can continue taking famotidine 40 mg every night.  In terms of his constipation, he has presented improvement while taking Metamucil and does not have any complaints at the moment.  We will continue this regimen for now but if he has recurring constipation he can restart taking MiraLAX.  - Schedule EGD with biopsies - Continue famotidine 40 mg at night for now - Continue Metamucil every day -Return to clinic in 1 year, will discuss screening colonoscopy at that time  All questions  were answered.      Harvel Quale, MD Gastroenterology and Hepatology [facility]

## 2021-02-18 NOTE — Patient Instructions (Signed)
Schedule EGD  Continue famotidine 40 mg at night for now Continue Metamucil every day

## 2021-02-18 NOTE — H&P (View-Only) (Signed)
Grant Stevens, M.D. Gastroenterology & Hepatology [facility] [address] [place], [place] [address]  Primary Care Physician: Grant Noble, MD [address] [place] [address]  I will communicate my assessment and recommendations to the referring MD via EMR.  Problems: GERD Short segment Barrett's esophagus Chronic idiopathic constipation  History of Present Illness: Grant Stevens is a 81 y.o. male  with pmh of localized bladder cancer s/p BCG injection, MI s/p CABG, asthma, BPH, cardiomyopathy, Heart failure, HTN, bipolar disorder, HLD, Barrett's esophagus, and IBS, who presents for follow up of constipation, GERD and Barrett's esophagus  The patient was last seen on 08/18/2018 to. At that time, the patient was advised to continue taking Metamucil for constipation.  He also underwent an abdominal x-ray that showed mild constipation for which she was counseled to take MiraLAX.  However, he is currently only taking Metamucil.  Patient reports that he has been moving his bowels every other day.  Denies having any abdominal pain and reports that his bowel movements are frequent.  Has been taking Metamucil compliantly daily.  Has been taking Pepcid 40 mg every night compliantly. Not on a PPI. Denies heartburn, odynophagia or dysphagia. He reports occasionally he chokes when chewing fast, so he has to take some time to eat. Has not modified his diet at all.  The patient denies having any nausea, vomiting, fever, chills, hematochezia, melena, hematemesis, abdominal distention, abdominal pain, diarrhea, jaundice, pruritus. Lost 5 lb since last visit as he was skipping lunch.  Last EGD: 2020 - Abnormal esophageal motility. - Mild Schatzki ring at GEJ(39).  This was dilated with the scope. - Patch of salmon colored mucosa at distal esophagus. Biopsied. - 2 cm hiatal hernia. - Erosive gastropathy. - Normal pylorus. - Duodenal  erosions without bleeding. - Normal second portion of the duodenum. Last Colonoscopy:2014 Examination performed to cecum. Quality of prep somewhat compromised examination of ascending colon and cecum. Redundant colon. Small polyp ablated via cold biopsy from proximal transverse colon - benign lymph node. Small external hemorrhoids.  Recommended repeat colonoscopy in 10 years  Past Medical History: Past Medical History:  Diagnosis Date   Allergic rhinitis    Arthritis    Asthma    Childhood   Atrial fibrillation ([place])    Remote history - WARCEF   Bladder cancer ([place])    BPH (benign prostatic hyperplasia)    Cardiomyopathy (HCC)    CHF (congestive heart failure) (HCC)    Coronary atherosclerosis of native coronary artery    Multivessel s/p CABG in 2002 post-IMI, LVEF 35% up to 50% postoperatively;   Depression    Difficulty sleeping    Erectile dysfunction    Essential hypertension    Frequency of urination    History of bipolar disorder    Hyperlipidemia    IBS (irritable bowel syndrome)    Myocardial infarction ([place]) 2002    Past Surgical History: Past Surgical History:  Procedure Laterality Date   BIOPSY  03/28/2018   Procedure: BIOPSY;  Surgeon: Rogene Houston, MD;  [facility];  Service: Endoscopy;;  esophagus   BIV PACEMAKER INSERTION CRT-P N/A 05/12/2020   Procedure: BIV PACEMAKER INSERTION CRT-P;  Surgeon: Thompson Grayer, MD;  Location: [place] [facility];  Service: Cardiovascular;  Laterality: N/A;   CATARACT EXTRACTION W/PHACO Left 12/29/2014   Procedure: CATARACT EXTRACTION PHACO AND INTRAOCULAR LENS PLACEMENT (IOC);  Surgeon: Williams Che, MD;  Location: [facility];  Service: Ophthalmology;  Laterality: Left;  CDE:3.71   CATARACT EXTRACTION W/PHACO Right 03/30/2015   Procedure: CATARACT EXTRACTION PHACO AND INTRAOCULAR LENS PLACEMENT RIGHT EYE CDE=2.56;  Surgeon: Williams Che, MD;  [facility];  Service: Ophthalmology;  Laterality: Right;    CIRCUMCISION  10/11/2003   COLONOSCOPY N/A 07/11/2012   rehman,transverse colon polyp (benign lymphoid); tortuous colon; prep adequate with much suction/lavage; hemorrhoids.   CORONARY ARTERY BYPASS GRAFT  08/10/2000   Dr. Servando Snare - LIMA to LAD, SVG to diagonal, SVG to PDA TRIPLE BYPASS   CYSTOSCOPY W/ RETROGRADES Bilateral 03/02/2015   Procedure: CYSTOSCOPY WITH RIGHT RETROGRADE PYELOGRAM,  ATTEMPTED LEFT RETROGRADE PYELOGRAM;  Surgeon: Cleon Gustin, MD;  [facility];  Service: Urology;  Laterality: Bilateral;   ESOPHAGEAL DILATION N/A 03/28/2018   Procedure: ESOPHAGEAL DILATION;  Surgeon: Rogene Houston, MD;  [facility];  Service: Endoscopy;  Laterality: N/A;   ESOPHAGOGASTRODUODENOSCOPY N/A 03/28/2018   rehman,Abnormal esophageal motility. mild schatzki ring at GEJ, dilated, patch of salmon colored mucosa at distal esophagus. Barrett's esophagus. 2cm HH. erosive gastropathy, normal pylorus, duodenal erosions w/o bleeding. normal second portion of duodenum   TEMPORARY PACEMAKER N/A 05/12/2020   Procedure: TEMPORARY PACEMAKER;  Surgeon: Martinique, Peter M, MD;  Location: [place] [facility];  Service: Cardiovascular;  Laterality: N/A;   TONSILLECTOMY     TRANSURETHRAL RESECTION OF BLADDER TUMOR N/A 01/26/2015   Procedure: TRANSURETHRAL RESECTION OF BLADDER TUMOR (TURBT);  Surgeon: Cleon Gustin, MD;  [facility];  Service: Urology;  Laterality: N/A;   TRANSURETHRAL RESECTION OF BLADDER TUMOR WITH GYRUS (TURBT-GYRUS) N/A 03/02/2015   Procedure: TRANSURETHRAL RESECTION OF BLADDER TUMOR WITH GYRUS (TURBT-GYRUS);  Surgeon: Cleon Gustin, MD;  [facility];  Service: Urology;  Laterality: N/A;   TRANSURETHRAL RESECTION OF PROSTATE N/A 01/26/2015   Procedure: TRANSURETHRAL RESECTION OF THE PROSTATE WITH GYRUS INSTRUMENTS;  Surgeon: Cleon Gustin, MD;  [facility];  Service: Urology;  Laterality: N/A;    Family History: Family History  Problem  Relation Age of Onset   Asthma Mother    Heart attack Father 56   Diabetes Brother     Social History: Social History   Tobacco Use  Smoking Status Former   Packs/day: 1.00   Years: 15.00   Pack years: 15.00   Types: Cigarettes   Quit date: 01/11/1971   Years since quitting: 50.1  Smokeless Tobacco Never   Social History   Substance and Sexual Activity  Alcohol Use Not Currently   Comment: occ.   Social History   Substance and Sexual Activity  Drug Use No    Allergies: Allergies  Allergen Reactions   Spironolactone Other (See Comments)    Hyperkalemia    Medications: Current Outpatient Medications  Medication Sig Dispense Refill   aspirin EC 81 MG tablet Take 1 tablet (81 mg total) by mouth daily.     atorvastatin (LIPITOR) 80 MG tablet Take 80 mg by mouth at bedtime.       bisoprolol (ZEBETA) 5 MG tablet Take 0.5 tablets (2.5 mg total) by mouth daily. 45 tablet 3   carbamazepine (TEGRETOL) 200 MG tablet Take 400 mg by mouth 2 (two) times daily.      divalproex (DEPAKOTE ER) 250 MG 24 hr tablet Take 250 mg by mouth in the morning and at bedtime.     empagliflozin (JARDIANCE) 10 MG TABS tablet Take 1 tablet (10 mg total) by mouth daily before breakfast. 30 tablet 11   famotidine (PEPCID)  40 MG tablet TAKE (1) TABLET BY MOUTH AT BEDTIME. 30 tablet 5   furosemide (LASIX) 40 MG tablet Take Lasix 40 mg alternating with 20 mg Daily 45 tablet 11   LORazepam (ATIVAN) 0.5 MG tablet Take 0.5 mg by mouth at bedtime.     Psyllium 0.36 g CAPS Take 2 capsules by mouth daily.     sacubitril-valsartan (ENTRESTO) 24-26 MG Take 1 tablet by mouth 2 (two) times daily. 60 tablet 11   tamsulosin (FLOMAX) 0.4 MG CAPS capsule Take 1 capsule (0.4 mg total) by mouth at bedtime. 90 capsule 3   temazepam (RESTORIL) 15 MG capsule Take 15 mg by mouth at bedtime as needed for sleep.     No current facility-administered medications for this visit.    Review of Systems: GENERAL: negative for  malaise, night sweats HEENT: No changes in hearing or vision, no nose bleeds or other nasal problems. NECK: Negative for lumps, goiter, pain and significant neck swelling RESPIRATORY: Negative for cough, wheezing CARDIOVASCULAR: Negative for chest pain, leg swelling, palpitations, orthopnea GI: SEE HPI MUSCULOSKELETAL: Negative for joint pain or swelling, back pain, and muscle pain. SKIN: Negative for lesions, rash PSYCH: Negative for sleep disturbance, mood disorder and recent psychosocial stressors. HEMATOLOGY Negative for prolonged bleeding, bruising easily, and swollen nodes. ENDOCRINE: Negative for cold or heat intolerance, polyuria, polydipsia and goiter. NEURO: negative for tremor, gait imbalance, syncope and seizures. The remainder of the review of systems is noncontributory.   Physical Exam: BP (!) 90/54 (BP Location: Left Arm, Patient Position: Sitting, Cuff Size: Large)    Pulse 65    Temp 97.8 F (36.6 C) (Oral)    Ht 6' (1.829 m)    Wt 196 lb 3.2 oz (89 kg)    BMI 26.61 kg/m  GENERAL: The patient is AO x3, in no acute distress. HEENT: Head is normocephalic and atraumatic. EOMI are intact. Mouth is well hydrated and without lesions. NECK: Supple. No masses LUNGS: Clear to auscultation. No presence of rhonchi/wheezing/rales. Adequate chest expansion HEART: RRR, normal s1 and s2. ABDOMEN: Soft, nontender, no guarding, no peritoneal signs, and nondistended. BS +. No masses. EXTREMITIES: Without any cyanosis, clubbing, rash, lesions or edema. NEUROLOGIC: AOx3, no focal motor deficit. SKIN: no jaundice, no rashes  Imaging/Labs: as above  I personally reviewed and interpreted the available labs, imaging and endoscopic files.  Impression and Plan: CEPHAS Stevens is a 81 y.o. male  with pmh of localized bladder cancer s/p BCG injection, MI s/p CABG, asthma, BPH, cardiomyopathy, Heart failure, HTN, bipolar disorder, HLD, Barrett's esophagus, and IBS, who presents for follow up  of constipation, GERD and Barrett's esophagus.  The patient has presented adequate control of his reflux symptoms and has not presented any more dysphagia.  He is due for surveillance of his Barrett's esophagus, he will be scheduled for EGD with biopsies following [place] protocol.  If there is evidence of active inflammation or recurrent stricture, he will be switched to a PPI.  For now, he can continue taking famotidine 40 mg every night.  In terms of his constipation, he has presented improvement while taking Metamucil and does not have any complaints at the moment.  We will continue this regimen for now but if he has recurring constipation he can restart taking MiraLAX.  - Schedule EGD with biopsies - Continue famotidine 40 mg at night for now - Continue Metamucil every day -Return to clinic in 1 year, will discuss screening colonoscopy at that time  All questions  were answered.      Harvel Quale, MD Gastroenterology and Hepatology [facility]

## 2021-02-22 DIAGNOSIS — F3110 Bipolar disorder, current episode manic without psychotic features, unspecified: Secondary | ICD-10-CM | POA: Diagnosis not present

## 2021-03-04 ENCOUNTER — Encounter (HOSPITAL_COMMUNITY): Payer: Self-pay

## 2021-03-04 ENCOUNTER — Other Ambulatory Visit: Payer: Self-pay

## 2021-03-04 NOTE — Patient Instructions (Addendum)
Grant Stevens  03/04/2021     @PREFPERIOPPHARMACY @   Your procedure is scheduled on 03/09/2021.   Report to [facility] at  [place]  A.M.   Call this number if you have problems the morning of surgery:  [phone]   Remember:  Follow the diet and prep instructions given to you by the office.    Take these medicines the morning of surgery with A SIP OF WATER                   bisoprolol, tegretol, depakote, entresto.     Do not wear jewelry, make-up or nail polish.  Do not wear lotions, powders, or perfumes, or deodorant.  Do not shave 48 hours prior to surgery.  Men may shave face and neck.  Do not bring valuables to the hospital.  [facility] is not responsible for any belongings or valuables.  Contacts, dentures or bridgework may not be worn into surgery.  Leave your suitcase in the car.  After surgery it may be brought to your room.  For patients admitted to the hospital, discharge time will be determined by your treatment team.  Patients discharged the day of surgery will not be allowed to drive home and must have someone with them for 24 hours.    Special instructions:   DO NOT smoke tobacco or vape for 24 hours before your procedure.  Please read over the following fact sheets that you were given. Anesthesia Post-op Instructions and Care and Recovery After Surgery       Upper Endoscopy, Adult, Care After This sheet gives you information about how to care for yourself after your procedure. Your health care provider may also give you more specific instructions. If you have problems or questions, contact your health care provider. What can I expect after the procedure? After the procedure, it is common to have: A sore throat. Mild stomach pain or discomfort. Bloating. Nausea. Follow these instructions at home:  Follow instructions from your health care provider about what to eat or drink after your procedure. Return to your normal activities as told  by your health care provider. Ask your health care provider what activities are safe for you. Take over-the-counter and prescription medicines only as told by your health care provider. If you were given a sedative during the procedure, it can affect you for several hours. Do not drive or operate machinery until your health care provider says that it is safe. Keep all follow-up visits as told by your health care provider. This is important. Contact a health care provider if you have: A sore throat that lasts longer than one day. Trouble swallowing. Get help right away if: You vomit blood or your vomit looks like coffee grounds. You have: A fever. Bloody, black, or tarry stools. A severe sore throat or you cannot swallow. Difficulty breathing. Severe pain in your chest or abdomen. Summary After the procedure, it is common to have a sore throat, mild stomach discomfort, bloating, and nausea. If you were given a sedative during the procedure, it can affect you for several hours. Do not drive or operate machinery until your health care provider says that it is safe. Follow instructions from your health care provider about what to eat or drink after your procedure. Return to your normal activities as told by your health care provider. This information is not intended to replace advice given to you by your health care provider. Make sure  you discuss any questions you have with your health care provider. Document Revised: 11/02/2018 Document Reviewed: 05/29/2017 Elsevier Patient Education  2022 [place] After This sheet gives you information about how to care for yourself after your procedure. Your health care provider may also give you more specific instructions. If you have problems or questions, contact your health care provider. What can I expect after the procedure? After the procedure, it is common to have: Tiredness. Forgetfulness about what happened  after the procedure. Impaired judgment for important decisions. Nausea or vomiting. Some difficulty with balance. Follow these instructions at home: For the time period you were told by your health care provider:   Rest as needed. Do not participate in activities where you could fall or become injured. Do not drive or use machinery. Do not drink alcohol. Do not take sleeping pills or medicines that cause drowsiness. Do not make important decisions or sign legal documents. Do not take care of children on your own. Eating and drinking Follow the diet that is recommended by your health care provider. Drink enough fluid to keep your urine pale yellow. If you vomit: Drink water, juice, or soup when you can drink without vomiting. Make sure you have little or no nausea before eating solid foods. General instructions Have a responsible adult stay with you for the time you are told. It is important to have someone help care for you until you are awake and alert. Take over-the-counter and prescription medicines only as told by your health care provider. If you have sleep apnea, surgery and certain medicines can increase your risk for breathing problems. Follow instructions from your health care provider about wearing your sleep device: Anytime you are sleeping, including during daytime naps. While taking prescription pain medicines, sleeping medicines, or medicines that make you drowsy. Avoid smoking. Keep all follow-up visits as told by your health care provider. This is important. Contact a health care provider if: You keep feeling nauseous or you keep vomiting. You feel light-headed. You are still sleepy or having trouble with balance after 24 hours. You develop a rash. You have a fever. You have redness or swelling around the IV site. Get help right away if: You have trouble breathing. You have new-onset confusion at home. Summary For several hours after your procedure, you may feel  tired. You may also be forgetful and have poor judgment. Have a responsible adult stay with you for the time you are told. It is important to have someone help care for you until you are awake and alert. Rest as told. Do not drive or operate machinery. Do not drink alcohol or take sleeping pills. Get help right away if you have trouble breathing, or if you suddenly become confused. This information is not intended to replace advice given to you by your health care provider. Make sure you discuss any questions you have with your health care provider. Document Revised: 09/12/2019 Document Reviewed: 11/29/2018 Elsevier Patient Education  2022 [employer].

## 2021-03-05 ENCOUNTER — Encounter (HOSPITAL_COMMUNITY)
Admission: RE | Admit: 2021-03-05 | Discharge: 2021-03-05 | Disposition: A | Discharge status: Home / Self Care | Payer: PPO | Source: Ambulatory Visit | Attending: Gastroenterology | Admitting: Gastroenterology

## 2021-03-09 ENCOUNTER — Encounter (HOSPITAL_COMMUNITY): Payer: Self-pay | Admitting: Gastroenterology

## 2021-03-09 ENCOUNTER — Encounter (HOSPITAL_COMMUNITY)
Admission: RE | Disposition: A | Discharge status: Home / Self Care | Payer: Self-pay | Source: Home / Self Care | Attending: Gastroenterology

## 2021-03-09 ENCOUNTER — Ambulatory Visit (HOSPITAL_BASED_OUTPATIENT_CLINIC_OR_DEPARTMENT_OTHER): Payer: PPO | Admitting: Anesthesiology

## 2021-03-09 ENCOUNTER — Ambulatory Visit (HOSPITAL_COMMUNITY): Payer: PPO | Admitting: Anesthesiology

## 2021-03-09 ENCOUNTER — Ambulatory Visit (HOSPITAL_COMMUNITY)
Admission: RE | Admit: 2021-03-09 | Discharge: 2021-03-09 | Disposition: A | Discharge status: Home / Self Care | Payer: PPO | Attending: Gastroenterology | Admitting: Gastroenterology

## 2021-03-09 DIAGNOSIS — Z8551 Personal history of malignant neoplasm of bladder: Secondary | ICD-10-CM | POA: Insufficient documentation

## 2021-03-09 DIAGNOSIS — Z79899 Other long term (current) drug therapy: Secondary | ICD-10-CM | POA: Insufficient documentation

## 2021-03-09 DIAGNOSIS — K219 Gastro-esophageal reflux disease without esophagitis: Secondary | ICD-10-CM | POA: Diagnosis not present

## 2021-03-09 DIAGNOSIS — I4891 Unspecified atrial fibrillation: Secondary | ICD-10-CM | POA: Insufficient documentation

## 2021-03-09 DIAGNOSIS — K449 Diaphragmatic hernia without obstruction or gangrene: Secondary | ICD-10-CM | POA: Diagnosis not present

## 2021-03-09 DIAGNOSIS — I509 Heart failure, unspecified: Secondary | ICD-10-CM | POA: Diagnosis not present

## 2021-03-09 DIAGNOSIS — Z7984 Long term (current) use of oral hypoglycemic drugs: Secondary | ICD-10-CM | POA: Insufficient documentation

## 2021-03-09 DIAGNOSIS — Z87891 Personal history of nicotine dependence: Secondary | ICD-10-CM | POA: Insufficient documentation

## 2021-03-09 DIAGNOSIS — G4730 Sleep apnea, unspecified: Secondary | ICD-10-CM | POA: Diagnosis not present

## 2021-03-09 DIAGNOSIS — Z01812 Encounter for preprocedural laboratory examination: Secondary | ICD-10-CM

## 2021-03-09 DIAGNOSIS — K3189 Other diseases of stomach and duodenum: Secondary | ICD-10-CM | POA: Diagnosis not present

## 2021-03-09 DIAGNOSIS — K2270 Barrett's esophagus without dysplasia: Secondary | ICD-10-CM | POA: Diagnosis not present

## 2021-03-09 DIAGNOSIS — Z950 Presence of cardiac pacemaker: Secondary | ICD-10-CM | POA: Insufficient documentation

## 2021-03-09 DIAGNOSIS — K319 Disease of stomach and duodenum, unspecified: Secondary | ICD-10-CM | POA: Diagnosis not present

## 2021-03-09 DIAGNOSIS — M1990 Unspecified osteoarthritis, unspecified site: Secondary | ICD-10-CM | POA: Insufficient documentation

## 2021-03-09 DIAGNOSIS — J45909 Unspecified asthma, uncomplicated: Secondary | ICD-10-CM | POA: Insufficient documentation

## 2021-03-09 DIAGNOSIS — I110 Hypertensive heart disease with heart failure: Secondary | ICD-10-CM | POA: Diagnosis not present

## 2021-03-09 DIAGNOSIS — I2510 Atherosclerotic heart disease of native coronary artery without angina pectoris: Secondary | ICD-10-CM | POA: Insufficient documentation

## 2021-03-09 DIAGNOSIS — F319 Bipolar disorder, unspecified: Secondary | ICD-10-CM | POA: Insufficient documentation

## 2021-03-09 DIAGNOSIS — Q438 Other specified congenital malformations of intestine: Secondary | ICD-10-CM | POA: Diagnosis not present

## 2021-03-09 DIAGNOSIS — Z951 Presence of aortocoronary bypass graft: Secondary | ICD-10-CM | POA: Diagnosis not present

## 2021-03-09 HISTORY — PX: ESOPHAGOGASTRODUODENOSCOPY (EGD) WITH PROPOFOL: SHX5813

## 2021-03-09 HISTORY — PX: BIOPSY: SHX5522

## 2021-03-09 LAB — GLUCOSE, CAPILLARY: Glucose-Capillary: 88 mg/dL (ref 70–99)

## 2021-03-09 SURGERY — ESOPHAGOGASTRODUODENOSCOPY (EGD) WITH PROPOFOL
Anesthesia: General

## 2021-03-09 MED ORDER — PHENYLEPHRINE HCL (PRESSORS) 10 MG/ML IV SOLN
INTRAVENOUS | Status: DC | PRN
  Administered 2021-03-09 (×4): 100 ug via INTRAVENOUS

## 2021-03-09 MED ORDER — SODIUM CHLORIDE 0.9 % IV SOLN: INTRAVENOUS | Status: DC

## 2021-03-09 MED ORDER — PROPOFOL 500 MG/50ML IV EMUL
INTRAVENOUS | Status: DC | PRN
  Administered 2021-03-09 (×2): 20 mg via INTRAVENOUS
  Administered 2021-03-09: 50 mg via INTRAVENOUS
  Administered 2021-03-09: 20 mg via INTRAVENOUS

## 2021-03-09 MED ORDER — LIDOCAINE HCL (CARDIAC) PF 100 MG/5ML IV SOSY
PREFILLED_SYRINGE | INTRAVENOUS | Status: DC | PRN
  Administered 2021-03-09: 50 mg via INTRAVENOUS

## 2021-03-09 MED ORDER — LACTATED RINGERS IV SOLN
INTRAVENOUS | Status: DC
  Administered 2021-03-09: 1000 mL via INTRAVENOUS

## 2021-03-09 NOTE — Op Note (Signed)
[facility] Patient Name: Grant Stevens Procedure Date: 03/09/2021 9:12 AM MRN: [id number] Date of Birth: [birth year] Attending MD: [name] ,  CSN: [id number] Age: 81 Admit Type: Outpatient Procedure:                Upper GI endoscopy Indications:              Follow-up of Barrett's esophagus Providers:                [name], Hughie Closs RN, RN, Charlsie Quest                            Theda Sers RN, RN, Randa Spike, Technician Referring MD:              Medicines:                Monitored Anesthesia Care Complications:            No immediate complications. Estimated Blood Loss:     Estimated blood loss: none. Procedure:                Pre-Anesthesia Assessment:                           - Prior to the procedure, a History and Physical                            was performed, and patient medications, allergies                            and sensitivities were reviewed. The patient's                            tolerance of previous anesthesia was reviewed.                           - The risks and benefits of the procedure and the                            sedation options and risks were discussed with the                            patient. All questions were answered and informed                            consent was obtained.                           After obtaining informed consent, the endoscope was                            passed under direct vision. Throughout the                            procedure, the patient's blood pressure, pulse, and                            oxygen  saturations were monitored continuously. The                            [id number]-H190 ([id number]) scope was introduced through the                            mouth, and advanced to the second part of duodenum.                            The upper GI endoscopy was accomplished without                            difficulty. The patient tolerated the procedure                             well. Scope In: 9:23:33 AM Scope Out: 9:35:50 AM Total Procedure Duration: 0 hours 12 minutes 17 seconds  Findings:      The esophagus and gastroesophageal junction were examined with white       light and narrow band imaging (NBI). There were esophageal mucosal       changes consistent with short-segment Barrett's esophagus. These changes       involved the mucosa extending to the Z-line (35 cm from the incisors).       Two tongues of salmon-colored mucosa were present from 35 to 36 cm. The       maximum longitudinal extent of these esophageal mucosal changes was 1 cm       in length. This was biopsied with a cold forceps for histology. This was       placed in jar 2.      A 2 cm hiatal hernia was present.      Diffuse mucosal changes characterized by discoloration were found in the       gastric antrum, which was concerning for possible intestinal metaplasia.       Imaging was performed using white light and narrow band imaging to       visualize the mucosa. Biopsies were taken with a cold forceps for       histology from antrum, incisura (jar 1) and body (jar 2).      The examined duodenum was normal. Impression:               - Esophageal mucosal changes consistent with                            short-segment Barrett's esophagus. Biopsied.                           - 2 cm hiatal hernia.                           - Discolored mucosa in the antrum. Biopsied.                           - Normal examined duodenum. Moderate Sedation:      Per Anesthesia Care Recommendation:           - Discharge patient to home (ambulatory).                           -  Resume previous diet.                           - Await pathology results.                           - Continue present medications.                           - Repeat upper endoscopy for surveillance based on                            pathology results. Procedure Code(s):        --- Professional ---                           [phone],  Esophagogastroduodenoscopy, flexible,                            transoral; with biopsy, single or multiple Diagnosis Code(s):        --- Professional ---                           K22.70, Barrett's esophagus without dysplasia                           K44.9, Diaphragmatic hernia without obstruction or                            gangrene                           K31.89, Other diseases of stomach and duodenum CPT copyright 2019 American Medical Association. All rights reserved. The codes documented in this report are preliminary and upon coder review may  be revised to meet current compliance requirements. [name], MD [name],  03/09/2021 9:43:17 AM This report has been signed electronically. Number of Addenda: 0

## 2021-03-09 NOTE — Transfer of Care (Signed)
Immediate Anesthesia Transfer of Care Note  Patient: Grant Stevens  Procedure(s) Performed: ESOPHAGOGASTRODUODENOSCOPY (EGD) WITH PROPOFOL BIOPSY  Patient Location: PACU  Anesthesia Type:General  Level of Consciousness: awake, alert , oriented and patient cooperative  Airway & Oxygen Therapy: Patient Spontanous Breathing  Post-op Assessment: Report given to RN, Post -op Vital signs reviewed and stable and Patient moving all extremities X 4  Post vital signs: Reviewed and stable  Last Vitals:  Vitals Value Taken Time  BP    Temp    Pulse    Resp    SpO2      Last Pain:  Vitals:   03/09/21 0920  TempSrc:   PainSc: 0-No pain      Patients Stated Pain Goal: 6 (17/92/17 8375)  Complications: No notable events documented.

## 2021-03-09 NOTE — Anesthesia Postprocedure Evaluation (Signed)
Anesthesia Post Note  Patient: Grant Stevens  Procedure(s) Performed: ESOPHAGOGASTRODUODENOSCOPY (EGD) WITH PROPOFOL BIOPSY  Patient location during evaluation: PACU Anesthesia Type: General Level of consciousness: awake and alert and oriented Pain management: pain level controlled Vital Signs Assessment: post-procedure vital signs reviewed and stable Respiratory status: spontaneous breathing, nonlabored ventilation and respiratory function stable Cardiovascular status: blood pressure returned to baseline and stable Postop Assessment: no apparent nausea or vomiting Anesthetic complications: no   No notable events documented.   Last Vitals:  Vitals:   03/09/21 0943 03/09/21 0951  BP:  106/60  Pulse: 61   Resp: 15   Temp: 36.8 C   SpO2: 98%     Last Pain:  Vitals:   03/09/21 0943  TempSrc: Oral  PainSc:                  Johnhenry Tippin C Shariah Assad

## 2021-03-09 NOTE — Interval H&P Note (Signed)
History and Physical Interval Note:  03/09/2021 8:50 AM  Grant Stevens  has presented today for surgery, with the diagnosis of Barretts Esophagus.  The various methods of treatment have been discussed with the patient and family. After consideration of risks, benefits and other options for treatment, the patient has consented to  Procedure(s) with comments: ESOPHAGOGASTRODUODENOSCOPY (EGD) WITH PROPOFOL (N/A) - 945 as a surgical intervention.  The patient's history has been reviewed, patient examined, no change in status, stable for surgery.  I have reviewed the patient's chart and labs.  Questions were answered to the patient's satisfaction.     [name]

## 2021-03-09 NOTE — Anesthesia Preprocedure Evaluation (Addendum)
Anesthesia Evaluation  Patient identified by MRN, date of birth, ID band Patient awake    Reviewed: Allergy & Precautions, NPO status , Patient's Chart, lab work & pertinent test results  Airway Mallampati: III  TM Distance: <3 FB Neck ROM: Full    Dental  (+) Dental Advisory Given, Teeth Intact   Pulmonary asthma , sleep apnea , former smoker,    Pulmonary exam normal breath sounds clear to auscultation       Cardiovascular Exercise Tolerance: Good hypertension, Pt. on medications + CAD, + Past MI, + CABG and +CHF  Normal cardiovascular exam+ dysrhythmias Atrial Fibrillation + pacemaker  Rhythm:Regular Rate:Normal     Neuro/Psych PSYCHIATRIC DISORDERS Depression    GI/Hepatic Neg liver ROS, GERD  Medicated,  Endo/Other  negative endocrine ROS  Renal/GU negative Renal ROS Bladder dysfunction (bladder cancer)      Musculoskeletal  (+) Arthritis , Osteoarthritis,    Abdominal   Peds  Hematology negative hematology ROS (+)   Anesthesia Other Findings   Reproductive/Obstetrics                           Anesthesia Physical Anesthesia Plan  ASA: 3  Anesthesia Plan: General   Post-op Pain Management: Minimal or no pain anticipated   Induction: Intravenous  PONV Risk Score and Plan: TIVA  Airway Management Planned: Natural Airway and Nasal Cannula  Additional Equipment:   Intra-op Plan:   Post-operative Plan:   Informed Consent: I have reviewed the patients History and Physical, chart, labs and discussed the procedure including the risks, benefits and alternatives for the proposed anesthesia with the patient or authorized representative who has indicated his/her understanding and acceptance.     Dental advisory given  Plan Discussed with: CRNA and Surgeon  Anesthesia Plan Comments:         Anesthesia Quick Evaluation

## 2021-03-09 NOTE — Discharge Instructions (Signed)
You are being discharged to home.  Resume your previous diet.  We are waiting for your pathology results.  Continue your present medications.  Your physician has recommended a repeat upper endoscopy for surveillance based on pathology results.

## 2021-03-10 LAB — SURGICAL PATHOLOGY

## 2021-03-15 ENCOUNTER — Encounter (HOSPITAL_COMMUNITY): Payer: Self-pay | Admitting: Gastroenterology

## 2021-03-24 ENCOUNTER — Encounter (INDEPENDENT_AMBULATORY_CARE_PROVIDER_SITE_OTHER): Payer: Self-pay

## 2021-04-09 DIAGNOSIS — I2510 Atherosclerotic heart disease of native coronary artery without angina pectoris: Secondary | ICD-10-CM | POA: Diagnosis not present

## 2021-04-09 DIAGNOSIS — I5022 Chronic systolic (congestive) heart failure: Secondary | ICD-10-CM | POA: Diagnosis not present

## 2021-04-09 DIAGNOSIS — E871 Hypo-osmolality and hyponatremia: Secondary | ICD-10-CM | POA: Diagnosis not present

## 2021-04-28 DIAGNOSIS — X32XXXD Exposure to sunlight, subsequent encounter: Secondary | ICD-10-CM | POA: Diagnosis not present

## 2021-04-28 DIAGNOSIS — L570 Actinic keratosis: Secondary | ICD-10-CM | POA: Diagnosis not present

## 2021-04-28 DIAGNOSIS — D0439 Carcinoma in situ of skin of other parts of face: Secondary | ICD-10-CM | POA: Diagnosis not present

## 2021-04-28 DIAGNOSIS — L720 Epidermal cyst: Secondary | ICD-10-CM | POA: Diagnosis not present

## 2021-04-29 DIAGNOSIS — Z79899 Other long term (current) drug therapy: Secondary | ICD-10-CM | POA: Diagnosis not present

## 2021-04-29 DIAGNOSIS — E785 Hyperlipidemia, unspecified: Secondary | ICD-10-CM | POA: Diagnosis not present

## 2021-04-29 DIAGNOSIS — I5022 Chronic systolic (congestive) heart failure: Secondary | ICD-10-CM | POA: Diagnosis not present

## 2021-04-29 DIAGNOSIS — I700 Atherosclerosis of aorta: Secondary | ICD-10-CM | POA: Diagnosis not present

## 2021-05-04 ENCOUNTER — Telehealth: Payer: Self-pay | Admitting: Cardiology

## 2021-05-04 ENCOUNTER — Telehealth: Payer: Self-pay | Admitting: *Deleted

## 2021-05-04 NOTE — Telephone Encounter (Signed)
Grant Stevens came in and, said he has been have some SOB after eating lunch today. He is sob even while resting. No chest pain. Next appointment is May 2nd. Please Advise. ?

## 2021-05-04 NOTE — Telephone Encounter (Signed)
Pt walked in office and c/o SOB. Pt states that he was making his bed this morning and became SOB. Pt denies swelling, weight gain and chest pain. Pt able to speak in full sentences. Pt has an appt with PCP on tomorrow at 9:45 am.  ?

## 2021-05-05 DIAGNOSIS — I5022 Chronic systolic (congestive) heart failure: Secondary | ICD-10-CM | POA: Diagnosis not present

## 2021-05-05 DIAGNOSIS — E871 Hypo-osmolality and hyponatremia: Secondary | ICD-10-CM | POA: Diagnosis not present

## 2021-05-05 DIAGNOSIS — F310 Bipolar disorder, current episode hypomanic: Secondary | ICD-10-CM | POA: Diagnosis not present

## 2021-05-05 NOTE — Telephone Encounter (Signed)
Spoke with pt who saw PCP on today and was told to take 80 mg BID for 2 days.  ?

## 2021-05-06 NOTE — Telephone Encounter (Signed)
Patient will see B.Strader,PA-C tomorrow, 05/07/21 at 2:30 pm ?

## 2021-05-07 ENCOUNTER — Ambulatory Visit: Payer: PPO | Admitting: Student

## 2021-05-07 ENCOUNTER — Encounter: Payer: Self-pay | Admitting: Student

## 2021-05-07 VITALS — BP 140/80 | HR 60 | Ht 72.0 in | Wt 206.8 lb

## 2021-05-07 DIAGNOSIS — I5020 Unspecified systolic (congestive) heart failure: Secondary | ICD-10-CM

## 2021-05-07 DIAGNOSIS — E785 Hyperlipidemia, unspecified: Secondary | ICD-10-CM | POA: Diagnosis not present

## 2021-05-07 DIAGNOSIS — I10 Essential (primary) hypertension: Secondary | ICD-10-CM

## 2021-05-07 DIAGNOSIS — I2510 Atherosclerotic heart disease of native coronary artery without angina pectoris: Secondary | ICD-10-CM

## 2021-05-07 DIAGNOSIS — I442 Atrioventricular block, complete: Secondary | ICD-10-CM

## 2021-05-07 MED ORDER — FUROSEMIDE 40 MG PO TABS: 40.0000 mg | ORAL_TABLET | Freq: Two times a day (BID) | ORAL | 3 refills | Status: DC

## 2021-05-07 NOTE — Progress Notes (Signed)
? ?Cardiology Office Note   ? ?Date:  05/08/2021  ? ?ID:  Grant Stevens, DOB 1940/02/08, MRN [id number] ? ?PCP:  [name], MD  ?Cardiologist: Rozann Lesches, MD   ? ?Chief Complaint  ?Patient presents with  ? Follow-up  ?  Worsening dyspnea on exertion  ? ? ?History of Present Illness:   ? ?Grant Stevens is a 81 y.o. male with past medical history of CAD (s/p CABG in 2002, no ischemia by NST in 11/2013), HFrEF/ICM (EF 30-35% by echo in 2018 and 10/2018, at 25-30% in 05/2020), SSS (s/p St[employer] Jude Dual PPM in 05/2020), HTN and HLD who presents to the office today for evaluation of worsening dyspnea. ? ?He was examined by myself in 02/2021 and reported improvement in his symptoms since being started on Jardiance in the prior month. He was back exercising at [employer] several days a week. His weight was stable at 199 lbs and he was continued on Bisoprolol 2.5 mg daily, Jardiance 10 mg daily, Entresto 24-26 mg twice daily and Lasix 40 mg daily alternating with 20 mg daily. ? ?He walked into the office on 05/04/2021 reporting worsening dyspnea at rest and with activity, therefore a follow-up visit was arranged. It appears he did see his PCP in the interim and was advised to take Lasix 40 mg twice daily for 2 days. ? ?In talking with the patient and his sister today, he reports his dyspnea acutely worsened earlier this week after consuming lunch as he had baked ham and vegetables at a [employer]. His typical weight is around 196 lbs and has increased to 206 lbs. In reviewing his diet further, he eats at local restaurants routinely and has been consuming pork and [place] stew. Has noticed orthopnea and dyspnea on exertion. No chest pain or palpitations. No associated abdominal distension or lower extremity edema.  ? ? ?Past Medical History:  ?Diagnosis Date  ? Allergic rhinitis   ? Arthritis   ? Asthma   ? Childhood  ? Atrial fibrillation ([place])   ? Remote history - WARCEF  ? Bladder cancer ([place])   ? BPH  (benign prostatic hyperplasia)   ? Cardiomyopathy ([place])   ? CHF (congestive heart failure) ([place])   ? Coronary atherosclerosis of native coronary artery   ? Multivessel s/p CABG in 2002 post-IMI, LVEF 35% up to 50% postoperatively;  ? Depression   ? Difficulty sleeping   ? Erectile dysfunction   ? Essential hypertension   ? Frequency of urination   ? History of bipolar disorder   ? Hyperlipidemia   ? IBS (irritable bowel syndrome)   ? Myocardial infarction [facility]) 2002  ? ? ?Past Surgical History:  ?Procedure Laterality Date  ? BIOPSY  03/28/2018  ? Procedure: BIOPSY;  Surgeon: Rogene Houston, MD;  [facility];  Service: Endoscopy;;  esophagus  ? BIOPSY  03/09/2021  ? Procedure: BIOPSY;  Surgeon: Harvel Quale, MD;  [facility];  Service: Gastroenterology;;  ? BIV PACEMAKER INSERTION CRT-P N/A 05/12/2020  ? Procedure: BIV PACEMAKER INSERTION CRT-P;  Surgeon: Thompson Grayer, MD;  Location: [place] [facility];  Service: Cardiovascular;  Laterality: N/A;  ? CATARACT EXTRACTION W/PHACO Left 12/29/2014  ? Procedure: CATARACT EXTRACTION PHACO AND INTRAOCULAR LENS PLACEMENT (IOC);  Surgeon: Williams Che, MD;  Location: [facility];  Service: Ophthalmology;  Laterality: Left;  CDE:3.71  ? CATARACT EXTRACTION W/PHACO Right 03/30/2015  ? Procedure: CATARACT EXTRACTION PHACO AND INTRAOCULAR LENS PLACEMENT RIGHT EYE  CDE=2.56;  Surgeon: Williams Che, MD;  [facility]: [facility];  Service: Ophthalmology;  Laterality: Right;  ? CIRCUMCISION  10/11/2003  ? COLONOSCOPY N/A 07/11/2012  ? rehman,transverse colon polyp (benign lymphoid); tortuous colon; prep adequate with much suction/lavage; hemorrhoids.  ? CORONARY ARTERY BYPASS GRAFT  08/10/2000  ? Dr. Servando Snare - LIMA to LAD, SVG to diagonal, SVG to PDA TRIPLE BYPASS  ? CYSTOSCOPY W/ RETROGRADES Bilateral 03/02/2015  ? Procedure: CYSTOSCOPY WITH RIGHT RETROGRADE PYELOGRAM,  ATTEMPTED LEFT RETROGRADE PYELOGRAM;  Surgeon: Cleon Gustin, MD;   [facility];  Service: Urology;  Laterality: Bilateral;  ? ESOPHAGEAL DILATION N/A 03/28/2018  ? Procedure: ESOPHAGEAL DILATION;  Surgeon: Rogene Houston, MD;  [facility];  Service: Endoscopy;  Laterality: N/A;  ? ESOPHAGOGASTRODUODENOSCOPY N/A 03/28/2018  ? rehman,Abnormal esophageal motility. mild schatzki ring at GEJ, dilated, patch of salmon colored mucosa at distal esophagus. Barrett's esophagus. 2cm HH. erosive gastropathy, normal pylorus, duodenal erosions w/o bleeding. normal second portion of duodenum  ? ESOPHAGOGASTRODUODENOSCOPY (EGD) WITH PROPOFOL N/A 03/09/2021  ? Procedure: ESOPHAGOGASTRODUODENOSCOPY (EGD) WITH PROPOFOL;  Surgeon: Harvel Quale, MD;  [facility];  Service: Gastroenterology;  Laterality: N/A;  945  ? TEMPORARY PACEMAKER N/A 05/12/2020  ? Procedure: TEMPORARY PACEMAKER;  Surgeon: Martinique, Peter M, MD;  Location: [place] [facility];  Service: Cardiovascular;  Laterality: N/A;  ? TONSILLECTOMY    ? TRANSURETHRAL RESECTION OF BLADDER TUMOR N/A 01/26/2015  ? Procedure: TRANSURETHRAL RESECTION OF BLADDER TUMOR (TURBT);  Surgeon: Cleon Gustin, MD;  [facility];  Service: Urology;  Laterality: N/A;  ? TRANSURETHRAL RESECTION OF BLADDER TUMOR WITH GYRUS (TURBT-GYRUS) N/A 03/02/2015  ? Procedure: TRANSURETHRAL RESECTION OF BLADDER TUMOR WITH GYRUS (TURBT-GYRUS);  Surgeon: Cleon Gustin, MD;  [facility];  Service: Urology;  Laterality: N/A;  ? TRANSURETHRAL RESECTION OF PROSTATE N/A 01/26/2015  ? Procedure: TRANSURETHRAL RESECTION OF THE PROSTATE WITH GYRUS INSTRUMENTS;  Surgeon: Cleon Gustin, MD;  [facility];  Service: Urology;  Laterality: N/A;  ? ? ?Current Medications: ?Outpatient Medications Prior to Visit  ?Medication Sig Dispense Refill  ? aspirin EC 81 MG tablet Take 1 tablet (81 mg total) by mouth daily.    ? atorvastatin (LIPITOR) 80 MG tablet Take 80 mg by mouth at bedtime.      ? bisoprolol (ZEBETA) 5 MG tablet  Take 0.5 tablets (2.5 mg total) by mouth daily. 45 tablet 3  ? carbamazepine (TEGRETOL) 200 MG tablet Take 400 mg by mouth 2 (two) times daily.     ? divalproex (DEPAKOTE ER) 250 MG 24 hr tablet Take 250 mg by mouth in the morning and at bedtime.    ? doxycycline (VIBRA-TABS) 100 MG tablet Take 100 mg by mouth 2 (two) times daily.    ? empagliflozin (JARDIANCE) 10 MG TABS tablet Take 1 tablet (10 mg total) by mouth daily before breakfast. 30 tablet 11  ? famotidine (PEPCID) 40 MG tablet TAKE (1) TABLET BY MOUTH AT BEDTIME. 30 tablet 5  ? LORazepam (ATIVAN) 0.5 MG tablet Take 0.5 mg by mouth at bedtime.    ? PSYLLIUM PO Take 2 capsules by mouth daily.    ? sacubitril-valsartan (ENTRESTO) 24-26 MG Take 1 tablet by mouth 2 (two) times daily. 60 tablet 11  ? tamsulosin (FLOMAX) 0.4 MG CAPS capsule Take 1 capsule (0.4 mg total) by mouth at bedtime. 90 capsule 3  ? temazepam (RESTORIL) 15 MG capsule Take 15 mg by mouth at bedtime as  needed for sleep.    ? furosemide (LASIX) 40 MG tablet Take Lasix 40 mg alternating with 20 mg Daily 45 tablet 11  ? ?No facility-administered medications prior to visit.  ?  ? ?Allergies:   Spironolactone  ? ?Social History  ? ?Socioeconomic History  ? Marital status: Widowed  ?  Spouse name: Not on file  ? Number of children: Not on file  ? Years of education: Not on file  ? Highest education level: Not on file  ?Occupational History  ? Not on file  ?Tobacco Use  ? Smoking status: Former  ?  Packs/day: 1.00  ?  Years: 15.00  ?  Pack years: 15.00  ?  Types: Cigarettes  ?  Quit date: 01/11/1971  ?  Years since quitting: 50.3  ? Smokeless tobacco: Never  ?Vaping Use  ? Vaping Use: Never used  ?Substance and Sexual Activity  ? Alcohol use: Not Currently  ?  Comment: occ.  ? Drug use: No  ? Sexual activity: Not Currently  ?Other Topics Concern  ? Not on file  ?Social History Narrative  ? Married with no children   ? Exercises 4 times weekly  ? Caffeine once a day  ? ?Social Determinants of Health   ? ?Financial Resource Strain: Not on file  ?Food Insecurity: Not on file  ?Transportation Needs: Not on file  ?Physical Activity: Not on file  ?Stress: Not on file  ?Social Connections: Not on file  ?  ? ?Famil

## 2021-05-07 NOTE — Patient Instructions (Signed)
Medication Instructions:  ? ?Take Lasix 40 mg Two Times Daily for 5-6 Days and then return Weight log or call office with weights  ? ?*If you need a refill on your cardiac medications before your next appointment, please call your pharmacy* ? ? ?Lab Work: ?NONE  ? ?If you have labs (blood work) drawn today and your tests are completely normal, you will receive your results only by: ?MyChart Message (if you have MyChart) OR ?A paper copy in the mail ?If you have any lab test that is abnormal or we need to change your treatment, we will call you to review the results. ? ? ?Testing/Procedures: ?Your physician recommends that you weigh, daily, at the same time every day, and in the same amount of clothing. Please record your daily weights on the handout provided and bring it to your next appointment. ? ? ? ? ?Follow-Up: ?At [facility], you and your health needs are our priority.  As part of our continuing mission to provide you with exceptional heart care, we have created designated Provider Care Teams.  These Care Teams include your primary Cardiologist (physician) and Advanced Practice Providers (APPs -  Physician Assistants and Nurse Practitioners) who all work together to provide you with the care you need, when you need it. ? ?We recommend signing up for the patient portal called "MyChart".  Sign up information is provided on this After Visit Summary.  MyChart is used to connect with patients for Virtual Visits (Telemedicine).  Patients are able to view lab/test results, encounter notes, upcoming appointments, etc.  Non-urgent messages can be sent to your provider as well.   ?To learn more about what you can do with MyChart, go to [email or web address].   ? ?Your next appointment:   ?3 month(s) ? ?The format for your next appointment:   ?In Person ? ?Provider:   ?Rozann Lesches, MD  ? ? ?Other Instructions ?Thank you for choosing [facility]! ?  ? ?Important Information About Sugar ? ? ? ? ?  ?

## 2021-05-08 ENCOUNTER — Encounter: Payer: Self-pay | Admitting: Student

## 2021-05-11 ENCOUNTER — Encounter: Payer: Self-pay | Admitting: Emergency Medicine

## 2021-05-11 ENCOUNTER — Ambulatory Visit: Payer: PPO | Admitting: Student

## 2021-05-11 ENCOUNTER — Ambulatory Visit
Admission: EM | Admit: 2021-05-11 | Discharge: 2021-05-11 | Disposition: A | Discharge status: Home / Self Care | Payer: PPO | Attending: Family Medicine | Admitting: Family Medicine

## 2021-05-11 DIAGNOSIS — J22 Unspecified acute lower respiratory infection: Secondary | ICD-10-CM | POA: Diagnosis not present

## 2021-05-11 MED ORDER — PROMETHAZINE-DM 6.25-15 MG/5ML PO SYRP: 5.0000 mL | ORAL_SOLUTION | Freq: Four times a day (QID) | ORAL | 0 refills | Status: DC | PRN

## 2021-05-11 MED ORDER — AZITHROMYCIN 250 MG PO TABS
ORAL_TABLET | ORAL | 0 refills | Status: DC
Start: 2021-05-11 — End: 2021-07-03

## 2021-05-11 NOTE — ED Provider Notes (Signed)
?[place] ? ? ? ?CSN: [id number] ?Arrival date & time: 05/11/21  1335 ? ? ?  ? ?History   ?Chief Complaint ?No chief complaint on file. ? ? ?HPI ?Grant Stevens is a 81 y.o. male.  ? ?Presenting today with over a week of productive cough, wheezing, rattling in the chest.  Initially also had some runny nose but states that that has cleared up.  States his symptoms are worsening over time.  Was seen by his primary care provider several days ago, was told to try over-the-counter cough medications which have not been helping.  Denies fever, chills, chest pain, abdominal pain, nausea vomiting or diarrhea.  No new sick contacts recently.  No known history of chronic pulmonary disease. ? ? ? ? ?Past Medical History:  ?Diagnosis Date  ? Allergic rhinitis   ? Arthritis   ? Asthma   ? Childhood  ? Atrial fibrillation ([place])   ? Remote history - WARCEF  ? Bladder cancer ([place])   ? BPH (benign prostatic hyperplasia)   ? Cardiomyopathy ([place])   ? CHF (congestive heart failure) ([place])   ? Coronary atherosclerosis of native coronary artery   ? Multivessel s/p CABG in 2002 post-IMI, LVEF 35% up to 50% postoperatively;  ? Depression   ? Difficulty sleeping   ? Erectile dysfunction   ? Essential hypertension   ? Frequency of urination   ? History of bipolar disorder   ? Hyperlipidemia   ? IBS (irritable bowel syndrome)   ? Myocardial infarction [facility]) 2002  ? ? ?Patient Active Problem List  ? Diagnosis Date Noted  ? Constipation 02/18/2021  ? Intermittent diarrhea 08/17/2020  ? Barrett's esophagus without dysplasia 08/17/2020  ? GERD (gastroesophageal reflux disease) 08/17/2020  ? Heart block AV complete ([place]) 05/12/2020  ? CAD (coronary artery disease) of artery bypass graft 05/12/2020  ? Syncope and collapse 05/12/2020  ? BPH (benign prostatic hyperplasia) 01/26/2015  ? Dizziness 10/19/2014  ? Coronary atherosclerosis of native coronary artery   ? History of atrial fibrillation   ? Essential hypertension, benign 09/22/2010   ? Mixed hyperlipidemia 11/28/2008  ? SLEEP APNEA 11/28/2008  ? ? ?Past Surgical History:  ?Procedure Laterality Date  ? BIOPSY  03/28/2018  ? Procedure: BIOPSY;  Surgeon: Rogene Houston, MD;  [facility];  Service: Endoscopy;;  esophagus  ? BIOPSY  03/09/2021  ? Procedure: BIOPSY;  Surgeon: Harvel Quale, MD;  [facility];  Service: Gastroenterology;;  ? BIV PACEMAKER INSERTION CRT-P N/A 05/12/2020  ? Procedure: BIV PACEMAKER INSERTION CRT-P;  Surgeon: Thompson Grayer, MD;  Location[facility] [place] [facility];  Service: Cardiovascular;  Laterality: N/A;  ? CATARACT EXTRACTION W/PHACO Left 12/29/2014  ? Procedure: CATARACT EXTRACTION PHACO AND INTRAOCULAR LENS PLACEMENT (IOC);  Surgeon: Williams Che, MD;  [facility];  Service: Ophthalmology;  Laterality: Left;  CDE:3.71  ? CATARACT EXTRACTION W/PHACO Right 03/30/2015  ? Procedure: CATARACT EXTRACTION PHACO AND INTRAOCULAR LENS PLACEMENT RIGHT EYE CDE=2.56;  Surgeon: Williams Che, MD;  [facility];  Service: Ophthalmology;  Laterality: Right;  ? CIRCUMCISION  10/11/2003  ? COLONOSCOPY N/A 07/11/2012  ? rehman,transverse colon polyp (benign lymphoid); tortuous colon; prep adequate with much suction/lavage; hemorrhoids.  ? CORONARY ARTERY BYPASS GRAFT  08/10/2000  ? Dr. Servando Snare - LIMA to LAD, SVG to diagonal, SVG to PDA TRIPLE BYPASS  ? CYSTOSCOPY W/ RETROGRADES Bilateral 03/02/2015  ? Procedure: CYSTOSCOPY WITH RIGHT RETROGRADE PYELOGRAM,  ATTEMPTED LEFT RETROGRADE PYELOGRAM;  Surgeon: Cleon Gustin, MD;  [facility];  Service: Urology;  Laterality: Bilateral;  ? ESOPHAGEAL DILATION N/A 03/28/2018  ? Procedure: ESOPHAGEAL DILATION;  Surgeon: Rogene Houston, MD;  [facility];  Service: Endoscopy;  Laterality: N/A;  ? ESOPHAGOGASTRODUODENOSCOPY N/A 03/28/2018  ? rehman,Abnormal esophageal motility. mild schatzki ring at GEJ, dilated, patch of salmon colored mucosa at distal esophagus. Barrett's  esophagus. 2cm HH. erosive gastropathy, normal pylorus, duodenal erosions w/o bleeding. normal second portion of duodenum  ? ESOPHAGOGASTRODUODENOSCOPY (EGD) WITH PROPOFOL N/A 03/09/2021  ? Procedure: ESOPHAGOGASTRODUODENOSCOPY (EGD) WITH PROPOFOL;  Surgeon: Harvel Quale, MD;  [facility];  Service: Gastroenterology;  Laterality: N/A;  945  ? TEMPORARY PACEMAKER N/A 05/12/2020  ? Procedure: TEMPORARY PACEMAKER;  Surgeon: Martinique, Peter M, MD;  Location: [place] [facility];  Service: Cardiovascular;  Laterality: N/A;  ? TONSILLECTOMY    ? TRANSURETHRAL RESECTION OF BLADDER TUMOR N/A 01/26/2015  ? Procedure: TRANSURETHRAL RESECTION OF BLADDER TUMOR (TURBT);  Surgeon: Cleon Gustin, MD;  [facility];  Service: Urology;  Laterality: N/A;  ? TRANSURETHRAL RESECTION OF BLADDER TUMOR WITH GYRUS (TURBT-GYRUS) N/A 03/02/2015  ? Procedure: TRANSURETHRAL RESECTION OF BLADDER TUMOR WITH GYRUS (TURBT-GYRUS);  Surgeon: Cleon Gustin, MD;  [facility];  Service: Urology;  Laterality: N/A;  ? TRANSURETHRAL RESECTION OF PROSTATE N/A 01/26/2015  ? Procedure: TRANSURETHRAL RESECTION OF THE PROSTATE WITH GYRUS INSTRUMENTS;  Surgeon: Cleon Gustin, MD;  [facility];  Service: Urology;  Laterality: N/A;  ? ? ? ? ? ?Home Medications   ? ?Prior to Admission medications   ?Medication Sig Start Date End Date Taking? Authorizing Provider  ?azithromycin (ZITHROMAX) 250 MG tablet Take first 2 tablets together, then 1 every day until finished. 05/11/21  Yes Volney American, PA-C  ?promethazine-dextromethorphan (PROMETHAZINE-DM) 6.25-15 MG/5ML syrup Take 5 mLs by mouth 4 (four) times daily as needed. 05/11/21  Yes Volney American, PA-C  ?aspirin EC 81 MG tablet Take 1 tablet (81 mg total) by mouth daily. 03/31/18   Rogene Houston, MD  ?atorvastatin (LIPITOR) 80 MG tablet Take 80 mg by mouth at bedtime.      [provider]  ?bisoprolol (ZEBETA) 5 MG tablet Take 0.5 tablets  (2.5 mg total) by mouth daily. 12/18/20   [name], MD  ?carbamazepine (TEGRETOL) 200 MG tablet Take 400 mg by mouth 2 (two) times daily.     [provider]  ?divalproex (DEPAKOTE ER) 250 MG 24 hr tablet Take 250 mg by mouth in the morning and at bedtime. 03/12/18   [provider]  ?doxycycline (VIBRA-TABS) 100 MG tablet Take 100 mg by mouth 2 (two) times daily. 04/28/21   [provider]  ?empagliflozin (JARDIANCE) 10 MG TABS tablet Take 1 tablet (10 mg total) by mouth daily before breakfast. 01/12/21   [name], MD  ?famotidine (PEPCID) 40 MG tablet TAKE (1) TABLET BY MOUTH AT BEDTIME. 02/01/21   Carlan, Chelsea L, NP  ?furosemide (LASIX) 40 MG tablet Take 1 tablet (40 mg total) by mouth 2 (two) times daily. 05/07/21   Erma Heritage, PA-C  ?LORazepam (ATIVAN) 0.5 MG tablet Take 0.5 mg by mouth at bedtime.    [provider]  ?PSYLLIUM PO Take 2 capsules by mouth daily.    [provider]  ?sacubitril-valsartan (ENTRESTO) 24-26 MG Take 1 tablet by mouth 2 (two) times daily. 11/23/20   [name], MD  ?tamsulosin (FLOMAX) 0.4 MG  CAPS capsule Take 1 capsule (0.4 mg total) by mouth at bedtime. 07/07/20   McKenzie, Candee Furbish, MD  ?temazepam (RESTORIL) 15 MG capsule Take 15 mg by mouth at bedtime as needed for sleep.    [provider]  ? ? ?Family History ?Family History  ?Problem Relation Age of Onset  ? Asthma Mother   ? Heart attack Father 60  ? Diabetes Brother   ? ? ?Social History ?Social History  ? ?Tobacco Use  ? Smoking status: Former  ?  Packs/day: 1.00  ?  Years: 15.00  ?  Pack years: 15.00  ?  Types: Cigarettes  ?  Quit date: 01/11/1971  ?  Years since quitting: 50.3  ? Smokeless tobacco: Never  ?Vaping Use  ? Vaping Use: Never used  ?Substance Use Topics  ? Alcohol use: Not Currently  ?  Comment: occ.  ? Drug use: No  ? ? ? ?Allergies   ?Spironolactone ? ? ?Review of Systems ?Review of Systems ?Per HPI ? ?Physical Exam ?Triage  Vital Signs ?ED Triage Vitals  ?Enc Vitals Group  ?   BP 05/11/21 1622 (!) 160/77  ?   Pulse Rate 05/11/21 1622 65  ?   Resp 05/11/21 1622 18  ?   Temp 05/11/21 1622 97.9 ?F (36.6 ?C)  ?   Temp Source 05/0

## 2021-05-11 NOTE — ED Triage Notes (Signed)
Productive Cough with yellow sputum over a week.  was seen by PCP on 4/28.  Over the counter cough medications are not helping.   ?

## 2021-05-12 ENCOUNTER — Ambulatory Visit (INDEPENDENT_AMBULATORY_CARE_PROVIDER_SITE_OTHER): Payer: PPO

## 2021-05-12 DIAGNOSIS — I442 Atrioventricular block, complete: Secondary | ICD-10-CM | POA: Diagnosis not present

## 2021-05-12 LAB — CUP PACEART REMOTE DEVICE CHECK
Battery Remaining Longevity: 91 mo
Battery Remaining Percentage: 92 %
Battery Voltage: 3.01 V
Brady Statistic AP VP Percent: 58 %
Brady Statistic AP VS Percent: 1 %
Brady Statistic AS VP Percent: 41 %
Brady Statistic AS VS Percent: 1 %
Brady Statistic RA Percent Paced: 58 %
Brady Statistic RV Percent Paced: 99 %
Date Time Interrogation Session: 20230503052914
Implantable Lead Implant Date: 20220503
Implantable Lead Implant Date: 20220503
Implantable Lead Location: 753859
Implantable Lead Location: 753860
Implantable Pulse Generator Implant Date: 20220503
Lead Channel Impedance Value: 390 Ohm
Lead Channel Impedance Value: 480 Ohm
Lead Channel Pacing Threshold Amplitude: 0.75 V
Lead Channel Pacing Threshold Amplitude: 0.75 V
Lead Channel Pacing Threshold Pulse Width: 0.5 ms
Lead Channel Pacing Threshold Pulse Width: 0.5 ms
Lead Channel Sensing Intrinsic Amplitude: 3 mV
Lead Channel Sensing Intrinsic Amplitude: 6 mV
Lead Channel Setting Pacing Amplitude: 2 V
Lead Channel Setting Pacing Amplitude: 2.5 V
Lead Channel Setting Pacing Pulse Width: 0.5 ms
Lead Channel Setting Sensing Sensitivity: 2 mV
Pulse Gen Model: 2272
Pulse Gen Serial Number: 3920512

## 2021-05-26 NOTE — Progress Notes (Signed)
Remote pacemaker transmission.   

## 2021-05-31 DIAGNOSIS — Z08 Encounter for follow-up examination after completed treatment for malignant neoplasm: Secondary | ICD-10-CM | POA: Diagnosis not present

## 2021-05-31 DIAGNOSIS — Z85828 Personal history of other malignant neoplasm of skin: Secondary | ICD-10-CM | POA: Diagnosis not present

## 2021-06-11 ENCOUNTER — Ambulatory Visit: Payer: PPO | Admitting: Urology

## 2021-06-11 ENCOUNTER — Encounter: Payer: Self-pay | Admitting: Urology

## 2021-06-11 VITALS — BP 105/62 | HR 60

## 2021-06-11 DIAGNOSIS — R339 Retention of urine, unspecified: Secondary | ICD-10-CM

## 2021-06-11 DIAGNOSIS — N138 Other obstructive and reflux uropathy: Secondary | ICD-10-CM | POA: Diagnosis not present

## 2021-06-11 DIAGNOSIS — N401 Enlarged prostate with lower urinary tract symptoms: Secondary | ICD-10-CM | POA: Diagnosis not present

## 2021-06-11 LAB — URINALYSIS, ROUTINE W REFLEX MICROSCOPIC
Bilirubin, UA: NEGATIVE
Ketones, UA: NEGATIVE
Leukocytes,UA: NEGATIVE
Nitrite, UA: NEGATIVE
Protein,UA: NEGATIVE
RBC, UA: NEGATIVE
Specific Gravity, UA: 1.01 (ref 1.005–1.030)
Urobilinogen, Ur: 0.2 mg/dL (ref 0.2–1.0)
pH, UA: 5.5 (ref 5.0–7.5)

## 2021-06-11 LAB — BLADDER SCAN AMB NON-IMAGING: Scan Result: 144

## 2021-06-11 MED ORDER — SILODOSIN 8 MG PO CAPS: 8.0000 mg | ORAL_CAPSULE | Freq: Every day | ORAL | 11 refills | Status: DC

## 2021-06-11 NOTE — Progress Notes (Signed)
post void residual =[phone] 

## 2021-06-11 NOTE — Patient Instructions (Signed)

## 2021-06-11 NOTE — Progress Notes (Signed)
06/11/2021 12:42 PM   Grant Stevens 04-Oct-1940 [id number]  Referring provider: [name], MD [address] [place],  [place] [address]  Urinary incontinence   HPI: Grant Stevens is a 81yo here for evaluation of BPH and urinary incontinence. He was doing well until 6 months ago when he noted fecal incontinence and now he has urinary incontinence. IPSS 25 QOL4 on flomax 0.'4mg'$  daily. He is currently on Jardiance. He has daily fecal and urinary incontinence. Urine stream is fair. He has urinary hesitancy. He has intermittent constipation and takes dulcolax.    PMH: Past Medical History:  Diagnosis Date   Allergic rhinitis    Arthritis    Asthma    Childhood   Atrial fibrillation ([place])    Remote history - WARCEF   Bladder cancer ([place])    BPH (benign prostatic hyperplasia)    Cardiomyopathy (HCC)    CHF (congestive heart failure) (HCC)    Coronary atherosclerosis of native coronary artery    Multivessel s/p CABG in 2002 post-IMI, LVEF 35% up to 50% postoperatively;   Depression    Difficulty sleeping    Erectile dysfunction    Essential hypertension    Frequency of urination    History of bipolar disorder    Hyperlipidemia    IBS (irritable bowel syndrome)    Myocardial infarction [facility]) 2002    Surgical History: Past Surgical History:  Procedure Laterality Date   BIOPSY  03/28/2018   Procedure: BIOPSY;  Surgeon: Grant Houston, MD;  [facility];  Service: Endoscopy;;  esophagus   BIOPSY  03/09/2021   Procedure: BIOPSY;  Surgeon: Grant Quale, MD;  [facility];  Service: Gastroenterology;;   BIV PACEMAKER INSERTION CRT-P N/A 05/12/2020   Procedure: BIV PACEMAKER INSERTION CRT-P;  Surgeon: Grant Grayer, MD;  Location: [place] [facility];  Service: Cardiovascular;  Laterality: N/A;   CATARACT EXTRACTION W/PHACO Left 12/29/2014   Procedure: CATARACT EXTRACTION PHACO AND INTRAOCULAR LENS PLACEMENT (IOC);  Surgeon: Grant Che, MD;  Location: [facility];  Service: Ophthalmology;  Laterality: Left;  CDE:3.71   CATARACT EXTRACTION W/PHACO Right 03/30/2015   Procedure: CATARACT EXTRACTION PHACO AND INTRAOCULAR LENS PLACEMENT RIGHT EYE CDE=2.56;  Surgeon: Grant Che, MD;  Location: [facility];  Service: Ophthalmology;  Laterality: Right;   CIRCUMCISION  10/11/2003   COLONOSCOPY N/A 07/11/2012   Grant Stevens,transverse colon polyp (benign lymphoid); tortuous colon; prep adequate with much suction/lavage; hemorrhoids.   CORONARY ARTERY BYPASS GRAFT  08/10/2000   Dr. Servando Stevens - LIMA to LAD, SVG to diagonal, SVG to PDA TRIPLE BYPASS   CYSTOSCOPY W/ RETROGRADES Bilateral 03/02/2015   Procedure: CYSTOSCOPY WITH RIGHT RETROGRADE PYELOGRAM,  ATTEMPTED LEFT RETROGRADE PYELOGRAM;  Surgeon: Grant Gustin, MD;  [facility]: [facility];  Service: Urology;  Laterality: Bilateral;   ESOPHAGEAL DILATION N/A 03/28/2018   Procedure: ESOPHAGEAL DILATION;  Surgeon: Grant Houston, MD;  [facility];  Service: Endoscopy;  Laterality: N/A;   ESOPHAGOGASTRODUODENOSCOPY N/A 03/28/2018   Grant Stevens,Abnormal esophageal motility. mild schatzki ring at GEJ, dilated, patch of salmon colored mucosa at distal esophagus. Barrett's esophagus. 2cm HH. erosive gastropathy, normal pylorus, duodenal erosions w/o bleeding. normal second portion of duodenum   ESOPHAGOGASTRODUODENOSCOPY (EGD) WITH PROPOFOL N/A 03/09/2021   Procedure: ESOPHAGOGASTRODUODENOSCOPY (EGD) WITH PROPOFOL;  Surgeon: Grant Quale, MD;  [facility];  Service: Gastroenterology;  Laterality: N/A;  945   TEMPORARY PACEMAKER N/A 05/12/2020   Procedure: TEMPORARY PACEMAKER;  Surgeon: Grant Stevens,  Grant Slade, MD;  Location: [place] [facility];  Service: Cardiovascular;  Laterality: N/A;   TONSILLECTOMY     TRANSURETHRAL RESECTION OF BLADDER TUMOR N/A 01/26/2015   Procedure: TRANSURETHRAL RESECTION OF BLADDER TUMOR (TURBT);  Surgeon: Grant Gustin, MD;  [facility];  Service: Urology;  Laterality: N/A;   TRANSURETHRAL RESECTION OF BLADDER TUMOR WITH GYRUS (TURBT-GYRUS) N/A 03/02/2015   Procedure: TRANSURETHRAL RESECTION OF BLADDER TUMOR WITH GYRUS (TURBT-GYRUS);  Surgeon: Grant Gustin, MD;  [facility];  Service: Urology;  Laterality: N/A;   TRANSURETHRAL RESECTION OF PROSTATE N/A 01/26/2015   Procedure: TRANSURETHRAL RESECTION OF THE PROSTATE WITH GYRUS INSTRUMENTS;  Surgeon: Grant Gustin, MD;  [facility];  Service: Urology;  Laterality: N/A;    Home Medications:  Allergies as of 06/11/2021       Reactions   Spironolactone Other (See Comments)   Hyperkalemia        Medication List        Accurate as of June 11, 2021 12:42 PM. If you have any questions, ask your nurse or doctor.          aspirin EC 81 MG tablet Take 1 tablet (81 mg total) by mouth daily.   atorvastatin 80 MG tablet Commonly known as: LIPITOR Take 80 mg by mouth at bedtime.   azithromycin 250 MG tablet Commonly known as: ZITHROMAX Take first 2 tablets together, then 1 every day until finished.   bisoprolol 5 MG tablet Commonly known as: ZEBETA Take 0.5 tablets (2.5 mg total) by mouth daily.   carbamazepine 200 MG tablet Commonly known as: TEGRETOL Take 400 mg by mouth 2 (two) times daily.   divalproex 250 MG 24 hr tablet Commonly known as: DEPAKOTE ER Take 250 mg by mouth in the morning and at bedtime.   doxycycline 100 MG tablet Commonly known as: VIBRA-TABS Take 100 mg by mouth 2 (two) times daily.   empagliflozin 10 MG Tabs tablet Commonly known as: Jardiance Take 1 tablet (10 mg total) by mouth daily before breakfast.   Entresto 24-26 MG Generic drug: sacubitril-valsartan Take 1 tablet by mouth 2 (two) times daily.   famotidine 40 MG tablet Commonly known as: PEPCID TAKE (1) TABLET BY MOUTH AT BEDTIME.   furosemide 40 MG tablet Commonly known as: Lasix Take 1 tablet (40 mg total) by mouth 2 (two) times daily.    LORazepam 0.5 MG tablet Commonly known as: ATIVAN Take 0.5 mg by mouth at bedtime.   promethazine-dextromethorphan 6.25-15 MG/5ML syrup Commonly known as: PROMETHAZINE-DM Take 5 mLs by mouth 4 (four) times daily as needed.   PSYLLIUM PO Take 2 capsules by mouth daily.   tamsulosin 0.4 MG Caps capsule Commonly known as: FLOMAX Take 1 capsule (0.4 mg total) by mouth at bedtime.   temazepam 15 MG capsule Commonly known as: RESTORIL Take 15 mg by mouth at bedtime as needed for sleep.        Allergies:  Allergies  Allergen Reactions   Spironolactone Other (See Comments)    Hyperkalemia    Family History: Family History  Problem Relation Age of Onset   Asthma Mother    Heart attack Father 55   Diabetes Brother     Social History:  reports that he quit smoking about 50 years ago. His smoking use included cigarettes. He has a 15.00 pack-year smoking history. He has never used smokeless tobacco. He reports that he does not currently use alcohol. He reports that he does not  use drugs.  ROS: All other review of systems were reviewed and are negative except what is noted above in HPI  Physical Exam: BP 105/62   Pulse 60   Constitutional:  Alert and oriented, No acute distress. HEENT: [place] AT, moist mucus membranes.  Trachea midline, no masses. Cardiovascular: No clubbing, cyanosis, or edema. Respiratory: Normal respiratory effort, no increased work of breathing. GI: Abdomen is soft, nontender, nondistended, no abdominal masses GU: No CVA tenderness.  Lymph: No cervical or inguinal lymphadenopathy. Skin: No rashes, bruises or suspicious lesions. Neurologic: Grossly intact, no focal deficits, moving all 4 extremities. Psychiatric: Normal mood and affect.  Laboratory Data: Lab Results  Component Value Date   WBC 15.8 (H) 05/12/2020   HGB 13.9 05/12/2020   HCT 41.0 05/12/2020   MCV 94.2 05/12/2020   PLT 159 05/12/2020    Lab Results  Component Value Date    CREATININE 0.98 11/30/2020    No results found for: PSA  No results found for: TESTOSTERONE  Lab Results  Component Value Date   HGBA1C 5.7 (H) 05/12/2020    Urinalysis    Component Value Date/Time   COLORURINE YELLOW 02/11/2020 1810   APPEARANCEUR CLEAR 02/11/2020 1810   LABSPEC >1.046 (H) 02/11/2020 1810   PHURINE 5.0 02/11/2020 1810   GLUCOSEU NEGATIVE 02/11/2020 1810   HGBUR NEGATIVE 02/11/2020 1810   BILIRUBINUR NEGATIVE 02/11/2020 1810   KETONESUR 5 (A) 02/11/2020 1810   PROTEINUR NEGATIVE 02/11/2020 1810   UROBILINOGEN 0.2 11/06/2014 2016   NITRITE NEGATIVE 02/11/2020 1810   LEUKOCYTESUR NEGATIVE 02/11/2020 1810    Lab Results  Component Value Date   BACTERIA NONE SEEN 02/20/2015    Pertinent Imaging:  Results for orders placed during the hospital encounter of 08/17/20  DG Abd 1 View  Narrative CLINICAL DATA:  Intermittent diarrhea for several months, history of IBS  EXAM: ABDOMEN - 1 VIEW  COMPARISON:  07/18/2009  FINDINGS: Scattered large and small bowel gas is noted. No obstructive changes are seen. Fecal material is noted throughout the colon consistent with a mild degree of colonic constipation. No free air is noted. No acute bony abnormality is seen.  IMPRESSION: Changes consistent with mild colonic constipation.   Electronically Signed By: Inez Catalina M.D. On: 08/17/2020 20:37  No results found for this or any previous visit.  No results found for this or any previous visit.  No results found for this or any previous visit.  No results found for this or any previous visit.  No results found for this or any previous visit.  No results found for this or any previous visit.  Results for orders placed during the hospital encounter of 02/20/15  CT RENAL STONE STUDY  Narrative CLINICAL DATA:  One day history of hematuria. Patient reports history of urinary bladder mass  EXAM: CT ABDOMEN AND PELVIS WITHOUT  CONTRAST  TECHNIQUE: Multidetector CT imaging of the abdomen and pelvis was performed following the standard protocol without oral or intravenous contrast material administration.  COMPARISON:  CT abdomen and pelvis November 13, 2014  FINDINGS: Lower chest: There is mild scarring in the anterior left base region. Lung bases otherwise are clear. There is a fairly small hiatal hernia. There are multiple foci of coronary artery calcification.  Hepatobiliary: There is a small calcified granuloma in the anterior right liver apex region. No other focal liver lesions are identified on this noncontrast enhanced study. Gallbladder is somewhat contracted without apparent wall thickening given the degree of contraction of the  gallbladder. There is no appreciable biliary duct dilatation.  Pancreas: No pancreatic mass or inflammatory focus.  Spleen: No splenic lesions are identified. There is a tiny splenule medial to the spleen posteriorly.  Adrenals/Urinary Tract: Adrenals appear unremarkable bilaterally. Kidneys bilaterally show no demonstrable mass or hydronephrosis on either side. There is no appreciable renal or ureteral calculus on either side. Within the urinary bladder posteriorly and inferiorly, there is a solid-appearing mass measuring 6.0 x 4.4 x 2.7 cm. The wall of the urinary bladder is not appreciably thickened. The urinary bladder is mildly distended at this time.  Stomach/Bowel: The wall of the rectum appears modestly thickened in a generalized manner. There is no perirectal stranding or fistula. There is no other bowel wall thickening. No mesenteric thickening is seen. No bowel obstruction. No free air or portal venous air.  Vascular/Lymphatic: There is atherosclerotic calcification in the aorta. There is no abdominal aortic aneurysm. No vascular lesions are apparent on this noncontrast enhanced study. There is no demonstrable adenopathy in the abdomen or  pelvis.  Reproductive: Prostate does not appear enlarged. However, there is loss of clear definition between the superior prostate and inferior bladder. Seminal vesicles do not appear enlarged. There is no pelvic mass outside of the urinary bladder. No free pelvic fluid.  Other: Appendix region appears unremarkable. No abscess or ascites is noted in the abdomen or pelvis.  Musculoskeletal: There is degenerative change in the lumbar spine, most marked at L5-S1. There are no blastic or lytic bone lesions. There is no intramuscular or abdominal wall lesion.  IMPRESSION: There is a mass in the posterior inferior urinary bladder measuring 6.0 x 4.4 x 2.7 cm. Urinary bladder neoplasm is of concern. There is no renal or ureteral calculus on either side. No hydronephrosis.  The superior aspect of the prostate cannot be delineated apart from the inferior urinary bladder wall. Significance of this finding is uncertain. Correlation with PSA advised.  Mild rectal wall thickening without associated stranding or fistula. Significance of this finding is uncertain. No other bowel wall thickening seen. No bowel obstruction.  There is a fairly small hiatal hernia.  No abscess.  Periappendiceal region normal.  Multiple foci of coronary artery calcification.   Electronically Signed By: Lowella Grip III M.D. On: 02/20/2015 14:33   Assessment & Plan:    1. Benign prostatic hyperplasia with urinary obstruction -We will start rapaflo '8mg'$  daily  2. Incomplete emptying of bladder -Rapaflo '8mg'$  daily. If this fails to improve his LUTS we will proceed with cystoscopy   No follow-ups on file.  Nicolette Bang, MD  [facility] [place]

## 2021-06-21 DIAGNOSIS — F3110 Bipolar disorder, current episode manic without psychotic features, unspecified: Secondary | ICD-10-CM | POA: Diagnosis not present

## 2021-06-22 DIAGNOSIS — Z79899 Other long term (current) drug therapy: Secondary | ICD-10-CM | POA: Diagnosis not present

## 2021-06-24 DIAGNOSIS — H903 Sensorineural hearing loss, bilateral: Secondary | ICD-10-CM | POA: Diagnosis not present

## 2021-07-01 DIAGNOSIS — H6120 Impacted cerumen, unspecified ear: Secondary | ICD-10-CM | POA: Diagnosis not present

## 2021-07-02 ENCOUNTER — Other Ambulatory Visit: Payer: Self-pay

## 2021-07-02 ENCOUNTER — Encounter (HOSPITAL_COMMUNITY): Payer: Self-pay

## 2021-07-02 ENCOUNTER — Observation Stay (HOSPITAL_COMMUNITY): Payer: PPO

## 2021-07-02 ENCOUNTER — Observation Stay (HOSPITAL_COMMUNITY)
Admission: EM | Admit: 2021-07-02 | Discharge: 2021-07-03 | Disposition: A | Discharge status: Home / Self Care | Payer: PPO | Attending: Emergency Medicine | Admitting: Emergency Medicine

## 2021-07-02 DIAGNOSIS — R197 Diarrhea, unspecified: Secondary | ICD-10-CM | POA: Diagnosis not present

## 2021-07-02 DIAGNOSIS — Z87891 Personal history of nicotine dependence: Secondary | ICD-10-CM | POA: Diagnosis not present

## 2021-07-02 DIAGNOSIS — I25810 Atherosclerosis of coronary artery bypass graft(s) without angina pectoris: Secondary | ICD-10-CM | POA: Diagnosis present

## 2021-07-02 DIAGNOSIS — N179 Acute kidney failure, unspecified: Principal | ICD-10-CM | POA: Diagnosis present

## 2021-07-02 DIAGNOSIS — K5900 Constipation, unspecified: Secondary | ICD-10-CM | POA: Diagnosis present

## 2021-07-02 DIAGNOSIS — E782 Mixed hyperlipidemia: Secondary | ICD-10-CM | POA: Diagnosis not present

## 2021-07-02 DIAGNOSIS — N400 Enlarged prostate without lower urinary tract symptoms: Secondary | ICD-10-CM | POA: Diagnosis present

## 2021-07-02 DIAGNOSIS — Z79899 Other long term (current) drug therapy: Secondary | ICD-10-CM | POA: Diagnosis not present

## 2021-07-02 DIAGNOSIS — I4891 Unspecified atrial fibrillation: Secondary | ICD-10-CM | POA: Diagnosis not present

## 2021-07-02 DIAGNOSIS — J45909 Unspecified asthma, uncomplicated: Secondary | ICD-10-CM | POA: Insufficient documentation

## 2021-07-02 DIAGNOSIS — Z7982 Long term (current) use of aspirin: Secondary | ICD-10-CM | POA: Diagnosis not present

## 2021-07-02 DIAGNOSIS — I442 Atrioventricular block, complete: Secondary | ICD-10-CM | POA: Insufficient documentation

## 2021-07-02 DIAGNOSIS — I509 Heart failure, unspecified: Secondary | ICD-10-CM | POA: Insufficient documentation

## 2021-07-02 DIAGNOSIS — I2510 Atherosclerotic heart disease of native coronary artery without angina pectoris: Secondary | ICD-10-CM | POA: Diagnosis not present

## 2021-07-02 DIAGNOSIS — E860 Dehydration: Secondary | ICD-10-CM | POA: Diagnosis not present

## 2021-07-02 DIAGNOSIS — Z950 Presence of cardiac pacemaker: Secondary | ICD-10-CM | POA: Insufficient documentation

## 2021-07-02 DIAGNOSIS — Z951 Presence of aortocoronary bypass graft: Secondary | ICD-10-CM | POA: Diagnosis not present

## 2021-07-02 DIAGNOSIS — Z7984 Long term (current) use of oral hypoglycemic drugs: Secondary | ICD-10-CM | POA: Diagnosis not present

## 2021-07-02 DIAGNOSIS — D696 Thrombocytopenia, unspecified: Secondary | ICD-10-CM | POA: Insufficient documentation

## 2021-07-02 DIAGNOSIS — Z8551 Personal history of malignant neoplasm of bladder: Secondary | ICD-10-CM | POA: Insufficient documentation

## 2021-07-02 DIAGNOSIS — K219 Gastro-esophageal reflux disease without esophagitis: Secondary | ICD-10-CM | POA: Diagnosis not present

## 2021-07-02 DIAGNOSIS — I10 Essential (primary) hypertension: Secondary | ICD-10-CM | POA: Diagnosis present

## 2021-07-02 DIAGNOSIS — E1169 Type 2 diabetes mellitus with other specified complication: Secondary | ICD-10-CM | POA: Diagnosis not present

## 2021-07-02 DIAGNOSIS — R159 Full incontinence of feces: Secondary | ICD-10-CM | POA: Insufficient documentation

## 2021-07-02 DIAGNOSIS — I5022 Chronic systolic (congestive) heart failure: Secondary | ICD-10-CM | POA: Diagnosis not present

## 2021-07-02 LAB — CBC
HCT: 38.6 % — ABNORMAL LOW (ref 39.0–52.0)
Hemoglobin: 12.4 g/dL — ABNORMAL LOW (ref 13.0–17.0)
MCH: 30.9 pg (ref 26.0–34.0)
MCHC: 32.1 g/dL (ref 30.0–36.0)
MCV: 96.3 fL (ref 80.0–100.0)
Platelets: 129 10*3/uL — ABNORMAL LOW (ref 150–400)
RBC: 4.01 MIL/uL — ABNORMAL LOW (ref 4.22–5.81)
RDW: 13.4 % (ref 11.5–15.5)
WBC: 8.3 10*3/uL (ref 4.0–10.5)
nRBC: 0 % (ref 0.0–0.2)

## 2021-07-02 LAB — COMPREHENSIVE METABOLIC PANEL
ALT: 19 U/L (ref 0–44)
AST: 23 U/L (ref 15–41)
Albumin: 3.8 g/dL (ref 3.5–5.0)
Alkaline Phosphatase: 72 U/L (ref 38–126)
Anion gap: 6 (ref 5–15)
BUN: 40 mg/dL — ABNORMAL HIGH (ref 8–23)
CO2: 26 mmol/L (ref 22–32)
Calcium: 8.6 mg/dL — ABNORMAL LOW (ref 8.9–10.3)
Chloride: 108 mmol/L (ref 98–111)
Creatinine, Ser: 1.68 mg/dL — ABNORMAL HIGH (ref 0.61–1.24)
GFR, Estimated: 41 mL/min — ABNORMAL LOW (ref >60)
Glucose, Bld: 141 mg/dL — ABNORMAL HIGH (ref 70–99)
Potassium: 4.8 mmol/L (ref 3.5–5.1)
Sodium: 140 mmol/L (ref 135–145)
Total Bilirubin: 0.6 mg/dL (ref 0.3–1.2)
Total Protein: 6.8 g/dL (ref 6.5–8.1)

## 2021-07-02 LAB — URINALYSIS, ROUTINE W REFLEX MICROSCOPIC
Bacteria, UA: NONE SEEN
Bilirubin Urine: NEGATIVE
Glucose, UA: >=500 mg/dL — AB
Hgb urine dipstick: NEGATIVE
Ketones, ur: NEGATIVE mg/dL
Leukocytes,Ua: NEGATIVE
Nitrite: NEGATIVE
Protein, ur: NEGATIVE mg/dL
Specific Gravity, Urine: 1.011 (ref 1.005–1.030)
pH: 6 (ref 5.0–8.0)

## 2021-07-02 LAB — POC OCCULT BLOOD, ED: Fecal Occult Bld: NEGATIVE

## 2021-07-02 LAB — MAGNESIUM: Magnesium: 2.3 mg/dL (ref 1.7–2.4)

## 2021-07-02 LAB — LIPASE, BLOOD: Lipase: 50 U/L (ref 11–51)

## 2021-07-02 MED ORDER — FAMOTIDINE 20 MG PO TABS
40.0000 mg | ORAL_TABLET | Freq: Every day | ORAL | Status: DC
  Administered 2021-07-02: 40 mg via ORAL
  Filled 2021-07-02: qty 2

## 2021-07-02 MED ORDER — ONDANSETRON HCL 4 MG/2ML IJ SOLN: 4.0000 mg | Freq: Four times a day (QID) | INTRAMUSCULAR | Status: DC | PRN

## 2021-07-02 MED ORDER — ACETAMINOPHEN 650 MG RE SUPP: 650.0000 mg | Freq: Four times a day (QID) | RECTAL | Status: DC | PRN

## 2021-07-02 MED ORDER — CARBAMAZEPINE 200 MG PO TABS
400.0000 mg | ORAL_TABLET | Freq: Two times a day (BID) | ORAL | Status: DC
  Administered 2021-07-02 – 2021-07-03 (×2): 400 mg via ORAL
  Filled 2021-07-02 (×2): qty 2

## 2021-07-02 MED ORDER — LORAZEPAM 0.5 MG PO TABS
0.5000 mg | ORAL_TABLET | Freq: Every day | ORAL | Status: DC
Start: 2021-07-02 — End: 2021-07-03
  Administered 2021-07-02: 0.5 mg via ORAL
  Filled 2021-07-02: qty 1

## 2021-07-02 MED ORDER — TEMAZEPAM 15 MG PO CAPS: 15.0000 mg | ORAL_CAPSULE | Freq: Every evening | ORAL | Status: DC | PRN

## 2021-07-02 MED ORDER — ONDANSETRON HCL 4 MG PO TABS: 4.0000 mg | ORAL_TABLET | Freq: Four times a day (QID) | ORAL | Status: DC | PRN

## 2021-07-02 MED ORDER — LACTATED RINGERS IV SOLN: INTRAVENOUS | Status: AC

## 2021-07-02 MED ORDER — ATORVASTATIN CALCIUM 40 MG PO TABS
80.0000 mg | ORAL_TABLET | Freq: Every day | ORAL | Status: DC
  Administered 2021-07-02: 80 mg via ORAL
  Filled 2021-07-02: qty 2

## 2021-07-02 MED ORDER — HYDRALAZINE HCL 20 MG/ML IJ SOLN: 10.0000 mg | INTRAMUSCULAR | Status: DC | PRN

## 2021-07-02 MED ORDER — TAMSULOSIN HCL 0.4 MG PO CAPS
0.8000 mg | ORAL_CAPSULE | Freq: Every day | ORAL | Status: DC
  Administered 2021-07-03: 0.8 mg via ORAL
  Filled 2021-07-02: qty 2

## 2021-07-02 MED ORDER — BISOPROLOL FUMARATE 5 MG PO TABS
2.5000 mg | ORAL_TABLET | Freq: Every day | ORAL | Status: DC
  Administered 2021-07-03: 2.5 mg via ORAL
  Filled 2021-07-02: qty 1

## 2021-07-02 MED ORDER — ACETAMINOPHEN 325 MG PO TABS: 650.0000 mg | ORAL_TABLET | Freq: Four times a day (QID) | ORAL | Status: DC | PRN

## 2021-07-02 MED ORDER — SODIUM CHLORIDE 0.9 % IV BOLUS
500.0000 mL | Freq: Once | INTRAVENOUS | Status: AC
  Administered 2021-07-02: 500 mL via INTRAVENOUS

## 2021-07-02 MED ORDER — DIVALPROEX SODIUM ER 250 MG PO TB24
250.0000 mg | ORAL_TABLET | Freq: Two times a day (BID) | ORAL | Status: DC
  Administered 2021-07-03 (×2): 250 mg via ORAL
  Filled 2021-07-02 (×6): qty 1

## 2021-07-02 MED ORDER — ASPIRIN 81 MG PO TBEC
81.0000 mg | DELAYED_RELEASE_TABLET | Freq: Every day | ORAL | Status: DC
  Administered 2021-07-03: 81 mg via ORAL
  Filled 2021-07-02: qty 1

## 2021-07-02 NOTE — Plan of Care (Signed)

## 2021-07-02 NOTE — ED Triage Notes (Signed)
Pt presents to ED with clear liquid diarrhea x "several days"

## 2021-07-02 NOTE — ED Provider Notes (Signed)
[facility] EMERGENCY DEPARTMENT Provider Note   CSN: [id number] Arrival date & time: 07/02/21  1416     History  Chief Complaint  Patient presents with   Diarrhea    Grant Stevens is a 81 y.o. male who presents the emergency department complaining of fecal discharge and watery diarrhea.  Patient states that he has been dealing with intermittent constipation and diarrhea for the past year or so.  Last time he saw his GI doctor, they prescribed him a medication to help with the diarrhea.  The diarrhea he has noticed fecal urgency, and often times cannot make it to the bathroom before having a bowel movement.  Today he started having thin discharge per rectum, requiring him to change his undergarments to different times.  This made him concerned, prompting his visit to the ER.  He denies any pain, any blood in his stool or dark tarry bowel movements.  States he has been eating and drinking normally.   Diarrhea Associated symptoms: no abdominal pain, no fever and no vomiting        Home Medications Prior to Admission medications   Medication Sig Start Date End Date Taking? Authorizing Provider  aspirin EC 81 MG tablet Take 1 tablet (81 mg total) by mouth daily. 03/31/18  Yes Rehman, Joline Maxcy, MD  atorvastatin (LIPITOR) 80 MG tablet Take 80 mg by mouth at bedtime.     Yes [provider]  bisoprolol (ZEBETA) 5 MG tablet Take 0.5 tablets (2.5 mg total) by mouth daily. 12/18/20  Yes Jonelle Sidle, MD  carbamazepine (TEGRETOL) 200 MG tablet Take 400 mg by mouth 2 (two) times daily.    Yes [provider]  divalproex (DEPAKOTE ER) 250 MG 24 hr tablet Take 250 mg by mouth in the morning and at bedtime. 03/12/18  Yes [provider]  empagliflozin (JARDIANCE) 10 MG TABS tablet Take 1 tablet (10 mg total) by mouth daily before breakfast. 01/12/21  Yes Jonelle Sidle, MD  famotidine (PEPCID) 40 MG tablet TAKE (1) TABLET BY MOUTH AT BEDTIME. 02/01/21  Yes Carlan,  Chelsea L, NP  furosemide (LASIX) 40 MG tablet Take 1 tablet (40 mg total) by mouth 2 (two) times daily. Patient taking differently: Take 20-40 mg by mouth daily. 20mg  one day then 40mg  the other day alternating 20mg -40mg  05/07/21  Yes Strader, [place] M, PA-C  LORazepam (ATIVAN) 0.5 MG tablet Take 0.5 mg by mouth at bedtime.   Yes [provider]  sacubitril-valsartan (ENTRESTO) 24-26 MG Take 1 tablet by mouth 2 (two) times daily. 11/23/20  Yes Jonelle Sidle, MD  silodosin (RAPAFLO) 8 MG CAPS capsule Take 1 capsule (8 mg total) by mouth daily with breakfast. 06/11/21  Yes McKenzie, Mardene Celeste, MD  tamsulosin (FLOMAX) 0.4 MG CAPS capsule Take 1 capsule (0.4 mg total) by mouth at bedtime. 07/07/20  Yes McKenzie, Mardene Celeste, MD  temazepam (RESTORIL) 15 MG capsule Take 15 mg by mouth at bedtime as needed for sleep.   Yes [provider]  azithromycin (ZITHROMAX) 250 MG tablet Take first 2 tablets together, then 1 every day until finished. Patient not taking: Reported on 07/02/2021 05/11/21   Particia Nearing, PA-C  doxycycline (VIBRA-TABS) 100 MG tablet Take 100 mg by mouth 2 (two) times daily. Patient not taking: Reported on 07/02/2021 04/28/21   [provider]  promethazine-dextromethorphan (PROMETHAZINE-DM) 6.25-15 MG/5ML syrup Take 5 mLs by mouth 4 (four) times daily as needed. Patient not taking: Reported on 07/02/2021 05/11/21  Particia Nearing, PA-C      Allergies    Spironolactone    Review of Systems   Review of Systems  Constitutional:  Negative for appetite change and fever.  Gastrointestinal:  Positive for diarrhea. Negative for abdominal pain, blood in stool, nausea, rectal pain and vomiting.  All other systems reviewed and are negative.   Physical Exam Updated Vital Signs BP (!) 143/80   Pulse 60   Temp 97.8 F (36.6 C) (Oral)   Resp 15   Ht 6' (1.829 m)   Wt 93.7 kg   SpO2 96%   BMI 28.02 kg/m  Physical Exam Vitals and nursing note  reviewed.  Constitutional:      Appearance: Normal appearance.  HENT:     Head: Normocephalic and atraumatic.  Eyes:     Conjunctiva/sclera: Conjunctivae normal.  Cardiovascular:     Rate and Rhythm: Normal rate and regular rhythm.  Pulmonary:     Effort: Pulmonary effort is normal. No respiratory distress.     Breath sounds: Normal breath sounds.  Abdominal:     General: There is no distension.     Palpations: Abdomen is soft.     Tenderness: There is no abdominal tenderness.  Skin:    General: Skin is warm and dry.  Neurological:     General: No focal deficit present.     Mental Status: He is alert.     ED Results / Procedures / Treatments   Labs (all labs ordered are listed, but only abnormal results are displayed) Labs Reviewed  COMPREHENSIVE METABOLIC PANEL - Abnormal; Notable for the following components:      Result Value   Glucose, Bld 141 (*)    BUN 40 (*)    Creatinine, Ser 1.68 (*)    Calcium 8.6 (*)    GFR, Estimated 41 (*)    All other components within normal limits  CBC - Abnormal; Notable for the following components:   RBC 4.01 (*)    Hemoglobin 12.4 (*)    HCT 38.6 (*)    Platelets 129 (*)    All other components within normal limits  GASTROINTESTINAL PANEL BY PCR, STOOL (REPLACES STOOL CULTURE)  LIPASE, BLOOD  MAGNESIUM  URINALYSIS, ROUTINE W REFLEX MICROSCOPIC  OCCULT BLOOD X 1 CARD TO LAB, STOOL    EKG None  Radiology No results found.  Procedures Procedures    Medications Ordered in ED Medications  sodium chloride 0.9 % bolus 500 mL (500 mLs Intravenous New Bag/Given 07/02/21 1555)    ED Course/ Medical Decision Making/ A&P                           Medical Decision Making Amount and/or Complexity of Data Reviewed Labs: ordered.  Risk Decision regarding hospitalization.   This patient is a 81 y.o. male  who presents to the ED for concern of diarrhea.   Differential diagnoses prior to evaluation: The emergent  differential diagnosis includes, but is not limited to,  Infectious causes such as: Viral, Bacterial, Parasitic; Toxin. Noninfectious causes such as: GI Bleed, Appendicitis, Mesenteric Ischemia, Diverticulitis, endocrine causes (adrenal, thyroid), Toxicologic exposures, Drug-associated, IBD.  This is not an exhaustive differential.   Past Medical History / Co-morbidities: HTN, HLD, CAD, BPH, Barrett's esophagus, Afib, CHF  Additional history: Chart reviewed. Pertinent results include: EF of 25-30% as of echocardiogram in 2022  Physical Exam: Physical exam performed. The pertinent findings include:   Lab Tests/Imaging studies:  I personally interpreted labs/imaging and the pertinent results include:  No leukocytosis, hemoglobin stable.  Creatinine 1.68, compared to 0.98 seven months ago. GFR of 41. Hemoccult negative.    Medications: I ordered medication including IV fluids.  I have reviewed the patients home medicines and have made adjustments as needed.  Consultations obtained: I consulted with hospitalist Dr Sherryll Burger who will admit   Disposition: After consideration of the diagnostic results and the patients response to treatment, I feel that  patient would benefit for hospitalization for acute kidney injury likely secondary to dehydration from diarrhea.  Final Clinical Impression(s) / ED Diagnoses Final diagnoses:  Acute kidney injury (HCC)  Diarrhea, unspecified type  Frequent fecal incontinence    Rx / DC Orders ED Discharge Orders     None      Portions of this report may have been transcribed using voice recognition software. Every effort was made to ensure accuracy; however, inadvertent computerized transcription errors may be present.    Jeanella Flattery 07/02/21 1654    Derwood Kaplan, MD 07/03/21 0021

## 2021-07-03 DIAGNOSIS — K219 Gastro-esophageal reflux disease without esophagitis: Secondary | ICD-10-CM

## 2021-07-03 DIAGNOSIS — N401 Enlarged prostate with lower urinary tract symptoms: Secondary | ICD-10-CM | POA: Diagnosis not present

## 2021-07-03 DIAGNOSIS — I10 Essential (primary) hypertension: Secondary | ICD-10-CM | POA: Diagnosis not present

## 2021-07-03 DIAGNOSIS — N138 Other obstructive and reflux uropathy: Secondary | ICD-10-CM | POA: Diagnosis not present

## 2021-07-03 DIAGNOSIS — N179 Acute kidney failure, unspecified: Secondary | ICD-10-CM | POA: Diagnosis not present

## 2021-07-03 LAB — CBC
HCT: 40 % (ref 39.0–52.0)
Hemoglobin: 12.8 g/dL — ABNORMAL LOW (ref 13.0–17.0)
MCH: 30.9 pg (ref 26.0–34.0)
MCHC: 32 g/dL (ref 30.0–36.0)
MCV: 96.6 fL (ref 80.0–100.0)
Platelets: 114 10*3/uL — ABNORMAL LOW (ref 150–400)
RBC: 4.14 MIL/uL — ABNORMAL LOW (ref 4.22–5.81)
RDW: 13.2 % (ref 11.5–15.5)
WBC: 7.1 10*3/uL (ref 4.0–10.5)
nRBC: 0 % (ref 0.0–0.2)

## 2021-07-03 LAB — BASIC METABOLIC PANEL
Anion gap: 5 (ref 5–15)
BUN: 34 mg/dL — ABNORMAL HIGH (ref 8–23)
CO2: 27 mmol/L (ref 22–32)
Calcium: 8.7 mg/dL — ABNORMAL LOW (ref 8.9–10.3)
Chloride: 104 mmol/L (ref 98–111)
Creatinine, Ser: 1.25 mg/dL — ABNORMAL HIGH (ref 0.61–1.24)
GFR, Estimated: 58 mL/min — ABNORMAL LOW (ref >60)
Glucose, Bld: 83 mg/dL (ref 70–99)
Potassium: 4.6 mmol/L (ref 3.5–5.1)
Sodium: 136 mmol/L (ref 135–145)

## 2021-07-03 LAB — MAGNESIUM: Magnesium: 2.3 mg/dL (ref 1.7–2.4)

## 2021-07-03 MED ORDER — FUROSEMIDE 40 MG PO TABS: 20.0000 mg | ORAL_TABLET | Freq: Every day | ORAL | Status: DC

## 2021-07-03 MED ORDER — LACTATED RINGERS IV SOLN: INTRAVENOUS | Status: DC

## 2021-07-03 MED ORDER — EMPAGLIFLOZIN 10 MG PO TABS: 10.0000 mg | ORAL_TABLET | Freq: Every day | ORAL | 11 refills | Status: DC

## 2021-07-06 DIAGNOSIS — H903 Sensorineural hearing loss, bilateral: Secondary | ICD-10-CM | POA: Diagnosis not present

## 2021-07-06 DIAGNOSIS — H6122 Impacted cerumen, left ear: Secondary | ICD-10-CM | POA: Diagnosis not present

## 2021-07-07 ENCOUNTER — Encounter: Payer: Self-pay | Admitting: Urology

## 2021-07-07 ENCOUNTER — Ambulatory Visit: Payer: PPO | Admitting: Urology

## 2021-07-07 VITALS — BP 115/65 | HR 65

## 2021-07-07 DIAGNOSIS — Z8551 Personal history of malignant neoplasm of bladder: Secondary | ICD-10-CM | POA: Diagnosis not present

## 2021-07-07 DIAGNOSIS — R197 Diarrhea, unspecified: Secondary | ICD-10-CM | POA: Diagnosis not present

## 2021-07-07 DIAGNOSIS — N178 Other acute kidney failure: Secondary | ICD-10-CM | POA: Diagnosis not present

## 2021-07-07 MED ORDER — CIPROFLOXACIN HCL 500 MG PO TABS: 500.0000 mg | ORAL_TABLET | Freq: Once | ORAL | Status: DC

## 2021-07-07 MED ORDER — SILODOSIN 8 MG PO CAPS
8.0000 mg | ORAL_CAPSULE | Freq: Every day | ORAL | 11 refills | Status: DC
Start: 2021-07-07 — End: 2022-01-28

## 2021-07-07 NOTE — Progress Notes (Signed)
   07/07/21  CC: difficulty urinating and hx of bladder cancer  HPI: [name] is a 81yo here for followup for BPH and bladder cancer Blood pressure 115/65, pulse 65. NED. A&Ox3.   No respiratory distress   Abd soft, NT, ND Normal phallus with bilateral descended testicles  Cystoscopy Procedure Note  Patient identification was confirmed, informed consent was obtained, and patient was prepped using Betadine solution.  Lidocaine jelly was administered per urethral meatus.     Pre-Procedure: - Inspection reveals a normal caliber ureteral meatus.  Procedure: The flexible cystoscope was introduced without difficulty - No urethral strictures/lesions are present. - Enlarged prostate lateral lobe hyperplasia - Normal bladder neck - Bilateral ureteral orifices identified - Bladder mucosa  reveals no ulcers, tumors, or lesions - No bladder stones - No trabeculation  Retroflexion shows no intravesical prostatic protrusion   Post-Procedure: - Patient tolerated the procedure well  Assessment/ Plan: RTC 6 months with PVR. RTC 1 year for cystoscopy  No follow-ups on file.  Nicolette Bang, MD

## 2021-07-07 NOTE — Patient Instructions (Signed)

## 2021-07-08 ENCOUNTER — Encounter (INDEPENDENT_AMBULATORY_CARE_PROVIDER_SITE_OTHER): Payer: Self-pay | Admitting: Gastroenterology

## 2021-07-08 ENCOUNTER — Ambulatory Visit (INDEPENDENT_AMBULATORY_CARE_PROVIDER_SITE_OTHER): Payer: PPO | Admitting: Gastroenterology

## 2021-07-08 VITALS — BP 125/77 | HR 62 | Temp 98.1°F | Ht 72.0 in | Wt 210.8 lb

## 2021-07-08 DIAGNOSIS — R490 Dysphonia: Secondary | ICD-10-CM | POA: Insufficient documentation

## 2021-07-08 DIAGNOSIS — R1310 Dysphagia, unspecified: Secondary | ICD-10-CM | POA: Diagnosis not present

## 2021-07-08 MED ORDER — PANTOPRAZOLE SODIUM 40 MG PO TBEC: 40.0000 mg | DELAYED_RELEASE_TABLET | Freq: Every day | ORAL | 2 refills | Status: DC

## 2021-07-08 NOTE — Patient Instructions (Signed)
Please continue to take pepcid '40mg'$  in the evenings are you are doing I am adding protonix '40mg'$  once daily, please take this 30 minutes prior to breakfast Continue to chew foods well, take small bites and sips of liquids between bites to avoid choking You can continue to put pills in applesauce or crush them as needed to avoid choking  Will plant to re evaluate symptoms again in about 1 month, If no improvement, we will discuss further evaluation of possible dysmotility of your esophagus as the cause of your symptoms

## 2021-07-08 NOTE — Progress Notes (Signed)
Referring Provider: [name], MD Primary Care Physician:  [name], MD Primary GI Physician: Jenetta Downer  Chief Complaint  Patient presents with   Dysphagia    Having trouble swallowing breads and meats. Has to take one pill at a time. States now having a change in his voice.    HPI:   LOYE VENTO is a 81 y.o. male with past medical history of localized bladder cancer s/p BCG injection, MI s/p CABG, asthma, BPH, cardiomyopathy, Heart failure, HTN, bipolar disorder, HLD, Barrett's esophagus, and IBS  Patient presenting today for dysphagia.  Last seen 02/18/21, at that time having BMs every other day without abdominal pain, using metamucil daily with good results. Taking pepcid '40mg'$  QHS, no PPI use. Reported occasional choking if eating too fast but otherwise no issues with dysphagia. Continued on H2B, scheduled for EGD r/t hx of Barrett's and advised to continue metamucil with good results.   Today, patient states that he has been having trouble swallowing for months. He states that he saw Dr. Laural Golden in the past and wants him to do an EGD on him. He feels that Dr. Laural Golden would come back from retirement to do this as he put a lot of hours in at the hospital over the years. He is having dysphagia quite often, states he will have issues with choking when he goes out to eat. Usually occurs with meat.  Has been told by his lady friend that he needs to eat slower. He can eat a sandwich at home and does not have any issues. Having issues maybe 5-6x/week. Pills go down well as he usually eats apple sauce or puts it in a banana. He denies any heartburn or acid regurgitation. Denies nausea or vomiting.  He does note that his voice has changed, feels more hoarse, started maybe 6 months ago. He has no cough or sore throat. If he eats slower and chews thoroughly he does not have any issues with choking. He has had to cough foods back up on occasion. Notably, he had abnormal esophageal motility noted on  EGD in 2020. No current PPI therapy, he denies specific heartburn or acid regurgitation. No sore throat or cough.   Last EGD:03/09/21- Esophageal mucosal changes consistent with short-segment Barrett's esophagus. Biopsied-1 cm w/o dysplasia - 2 cm hiatal hernia. - Discolored mucosa in the antrum. Biopsied-normal - Normal examined duodenum. Last Colonoscopy:2014 Examination performed to cecum. Quality of prep somewhat compromised examination of ascending colon and cecum. Redundant colon. Small polyp ablated via cold biopsy from proximal transverse colon - benign lymph node. Small external hemorrhoids.   Recommendations:  Repeat EGD in feb 2028, if medically fit Repeat colonoscopy in 2024 if medically fit  Past Medical History:  Diagnosis Date   Allergic rhinitis    Arthritis    Asthma    Childhood   Atrial fibrillation ([place])    Remote history - WARCEF   Bladder cancer ([place])    BPH (benign prostatic hyperplasia)    Cardiomyopathy ([place])    CHF (congestive heart failure) ([place])    Coronary atherosclerosis of native coronary artery    Multivessel s/p CABG in 2002 post-IMI, LVEF 35% up to 50% postoperatively;   Depression    Difficulty sleeping    Erectile dysfunction    Essential hypertension    Frequency of urination    History of bipolar disorder    Hyperlipidemia    IBS (irritable bowel syndrome)    Myocardial infarction ([place]) 2002    Past  Surgical History:  Procedure Laterality Date   BIOPSY  03/28/2018   Procedure: BIOPSY;  Surgeon: Rogene Houston, MD;  [facility];  Service: Endoscopy;;  esophagus   BIOPSY  03/09/2021   Procedure: BIOPSY;  Surgeon: Harvel Quale, MD;  [facility];  Service: Gastroenterology;;   BIV PACEMAKER INSERTION CRT-P N/A 05/12/2020   Procedure: BIV PACEMAKER INSERTION CRT-P;  Surgeon: Thompson Grayer, MD;  Location: [facility];  Service: Cardiovascular;  Laterality: N/A;   CATARACT EXTRACTION W/PHACO  Left 12/29/2014   Procedure: CATARACT EXTRACTION PHACO AND INTRAOCULAR LENS PLACEMENT (IOC);  Surgeon: Williams Che, MD;  [facility]: [facility];  Service: Ophthalmology;  Laterality: Left;  CDE:3.71   CATARACT EXTRACTION W/PHACO Right 03/30/2015   Procedure: CATARACT EXTRACTION PHACO AND INTRAOCULAR LENS PLACEMENT RIGHT EYE CDE=2.56;  Surgeon: Williams Che, MD;  [facility]: [facility];  Service: Ophthalmology;  Laterality: Right;   CIRCUMCISION  10/11/2003   COLONOSCOPY N/A 07/11/2012   rehman,transverse colon polyp (benign lymphoid); tortuous colon; prep adequate with much suction/lavage; hemorrhoids.   CORONARY ARTERY BYPASS GRAFT  08/10/2000   Dr. Servando Snare - LIMA to LAD, SVG to diagonal, SVG to PDA TRIPLE BYPASS   CYSTOSCOPY W/ RETROGRADES Bilateral 03/02/2015   Procedure: CYSTOSCOPY WITH RIGHT RETROGRADE PYELOGRAM,  ATTEMPTED LEFT RETROGRADE PYELOGRAM;  Surgeon: Cleon Gustin, MD;  [facility];  Service: Urology;  Laterality: Bilateral;   ESOPHAGEAL DILATION N/A 03/28/2018   Procedure: ESOPHAGEAL DILATION;  Surgeon: Rogene Houston, MD;  [facility];  Service: Endoscopy;  Laterality: N/A;   ESOPHAGOGASTRODUODENOSCOPY N/A 03/28/2018   rehman,Abnormal esophageal motility. mild schatzki ring at GEJ, dilated, patch of salmon colored mucosa at distal esophagus. Barrett's esophagus. 2cm HH. erosive gastropathy, normal pylorus, duodenal erosions w/o bleeding. normal second portion of duodenum   ESOPHAGOGASTRODUODENOSCOPY (EGD) WITH PROPOFOL N/A 03/09/2021   Procedure: ESOPHAGOGASTRODUODENOSCOPY (EGD) WITH PROPOFOL;  Surgeon: Harvel Quale, MD;  [facility];  Service: Gastroenterology;  Laterality: N/A;  945   TEMPORARY PACEMAKER N/A 05/12/2020   Procedure: TEMPORARY PACEMAKER;  Surgeon: Martinique, Peter M, MD;  Location: [facility];  Service: Cardiovascular;  Laterality: N/A;   TONSILLECTOMY     TRANSURETHRAL RESECTION OF BLADDER TUMOR N/A 01/26/2015    Procedure: TRANSURETHRAL RESECTION OF BLADDER TUMOR (TURBT);  Surgeon: Cleon Gustin, MD;  [facility];  Service: Urology;  Laterality: N/A;   TRANSURETHRAL RESECTION OF BLADDER TUMOR WITH GYRUS (TURBT-GYRUS) N/A 03/02/2015   Procedure: TRANSURETHRAL RESECTION OF BLADDER TUMOR WITH GYRUS (TURBT-GYRUS);  Surgeon: Cleon Gustin, MD;  [facility];  Service: Urology;  Laterality: N/A;   TRANSURETHRAL RESECTION OF PROSTATE N/A 01/26/2015   Procedure: TRANSURETHRAL RESECTION OF THE PROSTATE WITH GYRUS INSTRUMENTS;  Surgeon: Cleon Gustin, MD;  [facility];  Service: Urology;  Laterality: N/A;    Current Outpatient Medications  Medication Sig Dispense Refill   aspirin EC 81 MG tablet Take 1 tablet (81 mg total) by mouth daily.     atorvastatin (LIPITOR) 80 MG tablet Take 80 mg by mouth at bedtime.       bisoprolol (ZEBETA) 5 MG tablet Take 0.5 tablets (2.5 mg total) by mouth daily. 45 tablet 3   carbamazepine (TEGRETOL) 200 MG tablet Take 400 mg by mouth 2 (two) times daily.      divalproex (DEPAKOTE ER) 250 MG 24 hr tablet Take 250 mg by mouth in the morning and at bedtime.  empagliflozin (JARDIANCE) 10 MG TABS tablet Take 1 tablet (10 mg total) by mouth daily before breakfast. 30 tablet 11   famotidine (PEPCID) 40 MG tablet TAKE (1) TABLET BY MOUTH AT BEDTIME. 30 tablet 5   furosemide (LASIX) 40 MG tablet Take 0.5-1 tablets (20-40 mg total) by mouth daily. '20mg'$  one day then '40mg'$  the other day alternating '20mg'$ -'40mg'$      LORazepam (ATIVAN) 0.5 MG tablet Take 0.5 mg by mouth at bedtime.     sacubitril-valsartan (ENTRESTO) 24-26 MG Take 1 tablet by mouth 2 (two) times daily. 60 tablet 11   silodosin (RAPAFLO) 8 MG CAPS capsule Take 1 capsule (8 mg total) by mouth daily with breakfast. 30 capsule 11   tamsulosin (FLOMAX) 0.4 MG CAPS capsule Take 1 capsule (0.4 mg total) by mouth at bedtime. 90 capsule 3   temazepam (RESTORIL) 15 MG capsule Take 15 mg by mouth at bedtime  as needed for sleep.     Current Facility-Administered Medications  Medication Dose Route Frequency Provider Last Rate Last Admin   ciprofloxacin (CIPRO) tablet 500 mg  500 mg Oral Once Cleon Gustin, MD        Allergies as of 07/08/2021 - Review Complete 07/08/2021  Allergen Reaction Noted   Spironolactone Other (See Comments) 10/07/2020    Family History  Problem Relation Age of Onset   Asthma Mother    Heart attack Father 70   Diabetes Brother     Social History   Socioeconomic History   Marital status: Widowed    Spouse name: Not on file   Number of children: Not on file   Years of education: Not on file   Highest education level: Not on file  Occupational History   Not on file  Tobacco Use   Smoking status: Former    Packs/day: 1.00    Years: 15.00    Total pack years: 15.00    Types: Cigarettes    Quit date: 01/11/1971    Years since quitting: 50.5    Passive exposure: Past   Smokeless tobacco: Never  Vaping Use   Vaping Use: Never used  Substance and Sexual Activity   Alcohol use: Not Currently    Comment: occ.   Drug use: No   Sexual activity: Not Currently  Other Topics Concern   Not on file  Social History Narrative   Married with no children    Exercises 4 times weekly   Caffeine once a day   Social Determinants of Health   Financial Resource Strain: Not on file  Food Insecurity: Not on file  Transportation Needs: Not on file  Physical Activity: Not on file  Stress: Not on file  Social Connections: Not on file   Review of systems General: negative for malaise, night sweats, fever, chills, weight loss Neck: Negative for lumps, goiter, pain and significant neck swelling Resp: Negative for cough, wheezing, dyspnea at rest CV: Negative for chest pain, leg swelling, palpitations, orthopnea GI: denies melena, hematochezia, nausea, vomiting, diarrhea, constipation, odyonophagia, early satiety or unintentional weight loss. +dysphagia  +hoarseness MSK: Negative for joint pain or swelling, back pain, and muscle pain. Derm: Negative for itching or rash Psych: Denies depression, anxiety, memory loss, confusion. No homicidal or suicidal ideation.  Heme: Negative for prolonged bleeding, bruising easily, and swollen nodes. Endocrine: Negative for cold or heat intolerance, polyuria, polydipsia and goiter. Neuro: negative for tremor, gait imbalance, syncope and seizures. The remainder of the review of systems is noncontributory.  Physical Exam: BP  125/77 (BP Location: Left Arm, Patient Position: Sitting, Cuff Size: Large)   Pulse 62   Temp 98.1 F (36.7 C) (Oral)   Ht 6' (1.829 m)   Wt 210 lb 12.8 oz (95.6 kg)   BMI 28.59 kg/m  General:   Alert and oriented. No distress noted. Pleasant and cooperative.  Head:  Normocephalic and atraumatic. Eyes:  Conjuctiva clear without scleral icterus. Mouth:  Oral mucosa pink and moist. Good dentition. No lesions. Heart: Normal rate and rhythm, s1 and s2 heart sounds present.  Lungs: Clear lung sounds in all lobes. Respirations equal and unlabored. Abdomen:  +BS, soft, non-tender and non-distended. No rebound or guarding. No HSM or masses noted. Derm: No palmar erythema or jaundice Msk:  Symmetrical without gross deformities. Normal posture. Extremities:  Without edema. Neurologic:  Alert and  oriented x4 Psych:  Alert and cooperative. Normal mood and affect.  Invalid input(s): "6 MONTHS"   ASSESSMENT: JAHLEN BOLLMAN is a 81 y.o. male presenting today for dysphagia  He reports dysphagia for the past few months with hoarseness x6 months. States dysphagia happens mostly with meats and a lot of times when he is eating out. He tries to chew well but his significant other has told him he is eating too fast. Notably he had recent EGD in February with no findings of esophagitis, esophageal ring, web, stricture or stenosis, EGD in 2020 with findings concerning for abnormal esophageal  motility. He is not currently on PPI therapy, maintained on pepcid '40mg'$  QHS. Given hoarseness, query if he is having some silent reflux precipitating his symptoms. I discussed with him adding daily PPI to his current H2B regimen. He should continue with chewing precautions, taking small bites, chewing thoroughly and slowly and taking sips of liquids between bites. Should avoid meats that tend to cause more issues. Will trial PPI x1 month and re evaluate symptoms. Will consider further evaluation of dysphagia with possible esophageal manometry at that time if symptoms persist.    PLAN:  Chewing precautions 2. Contine pepcid QHS 3. Rx protonix '40mg'$  daily, 30 mins prior to breakfast 4.  Reflux precautions 5. Further considerations for manometry if no improvement with PPI  All questions were answered, patient verbalized understanding and is in agreement with plan as outlined above.   Follow Up: 1 month  Taryne Kiger L. Alver Sorrow, MSN, APRN, AGNP-C Adult-Gerontology Nurse Practitioner [facility]

## 2021-07-10 ENCOUNTER — Emergency Department (HOSPITAL_COMMUNITY)
Admission: EM | Admit: 2021-07-10 | Discharge: 2021-07-11 | Disposition: A | Discharge status: Home / Self Care | Payer: PPO | Attending: Emergency Medicine | Admitting: Emergency Medicine

## 2021-07-10 ENCOUNTER — Encounter (HOSPITAL_COMMUNITY): Payer: Self-pay | Admitting: Emergency Medicine

## 2021-07-10 DIAGNOSIS — Z951 Presence of aortocoronary bypass graft: Secondary | ICD-10-CM | POA: Diagnosis not present

## 2021-07-10 DIAGNOSIS — Z87891 Personal history of nicotine dependence: Secondary | ICD-10-CM | POA: Diagnosis not present

## 2021-07-10 DIAGNOSIS — R197 Diarrhea, unspecified: Secondary | ICD-10-CM | POA: Insufficient documentation

## 2021-07-10 DIAGNOSIS — I110 Hypertensive heart disease with heart failure: Secondary | ICD-10-CM | POA: Diagnosis not present

## 2021-07-10 DIAGNOSIS — Z859 Personal history of malignant neoplasm, unspecified: Secondary | ICD-10-CM | POA: Insufficient documentation

## 2021-07-10 DIAGNOSIS — J45909 Unspecified asthma, uncomplicated: Secondary | ICD-10-CM | POA: Insufficient documentation

## 2021-07-10 DIAGNOSIS — Z8551 Personal history of malignant neoplasm of bladder: Secondary | ICD-10-CM | POA: Diagnosis not present

## 2021-07-10 DIAGNOSIS — I509 Heart failure, unspecified: Secondary | ICD-10-CM | POA: Diagnosis not present

## 2021-07-10 NOTE — ED Triage Notes (Signed)
Pt c/o continued diarrhea since being discharged from hospital. Pt states this problem started in 2004.

## 2021-07-11 LAB — COMPREHENSIVE METABOLIC PANEL
ALT: 19 U/L (ref 0–44)
AST: 22 U/L (ref 15–41)
Albumin: 3.5 g/dL (ref 3.5–5.0)
Alkaline Phosphatase: 65 U/L (ref 38–126)
Anion gap: 9 (ref 5–15)
BUN: 36 mg/dL — ABNORMAL HIGH (ref 8–23)
CO2: 24 mmol/L (ref 22–32)
Calcium: 8.1 mg/dL — ABNORMAL LOW (ref 8.9–10.3)
Chloride: 103 mmol/L (ref 98–111)
Creatinine, Ser: 1.46 mg/dL — ABNORMAL HIGH (ref 0.61–1.24)
GFR, Estimated: 48 mL/min — ABNORMAL LOW (ref >60)
Glucose, Bld: 136 mg/dL — ABNORMAL HIGH (ref 70–99)
Potassium: 4.3 mmol/L (ref 3.5–5.1)
Sodium: 136 mmol/L (ref 135–145)
Total Bilirubin: 0.4 mg/dL (ref 0.3–1.2)
Total Protein: 6.3 g/dL — ABNORMAL LOW (ref 6.5–8.1)

## 2021-07-11 LAB — CBC
HCT: 35.1 % — ABNORMAL LOW (ref 39.0–52.0)
Hemoglobin: 12 g/dL — ABNORMAL LOW (ref 13.0–17.0)
MCH: 32.4 pg (ref 26.0–34.0)
MCHC: 34.2 g/dL (ref 30.0–36.0)
MCV: 94.9 fL (ref 80.0–100.0)
Platelets: 133 10*3/uL — ABNORMAL LOW (ref 150–400)
RBC: 3.7 MIL/uL — ABNORMAL LOW (ref 4.22–5.81)
RDW: 13.3 % (ref 11.5–15.5)
WBC: 6.8 10*3/uL (ref 4.0–10.5)
nRBC: 0 % (ref 0.0–0.2)

## 2021-07-11 LAB — MAGNESIUM: Magnesium: 2 mg/dL (ref 1.7–2.4)

## 2021-07-11 LAB — PROTIME-INR
INR: 1 (ref 0.8–1.2)
Prothrombin Time: 13.3 seconds (ref 11.4–15.2)

## 2021-07-11 MED ORDER — LACTATED RINGERS IV BOLUS
1000.0000 mL | Freq: Once | INTRAVENOUS | Status: AC
  Administered 2021-07-11: 1000 mL via INTRAVENOUS

## 2021-07-11 NOTE — Discharge Instructions (Signed)
You were evaluated in the Emergency Department and after careful evaluation, we did not find any emergent condition requiring admission or further testing in the hospital.  Your exam/testing today is overall reassuring.  Recommend following up with your regular doctors to further discuss your diarrhea.  Important that you keep up with your fluid losses at home.  You should discuss C. difficile or stool testing with your regular doctors.  Please return to the Emergency Department if you experience any worsening of your condition.   Thank you for allowing [place] to be a part of your care.

## 2021-07-11 NOTE — ED Provider Notes (Signed)
[facility] Emergency Department Provider Note MRN:  [id number]  Arrival date & time: 07/11/21     Chief Complaint   Diarrhea   History of Present Illness   Grant Stevens is a 81 y.o. year-old male with a history of A-fib, bladder cancer, cardiomyopathy presenting to the ED with chief complaint of diarrhea.  Diarrhea chronically for the past 20 years, waxing and waning in severity.  Recent admission for diarrhea and dehydration.  The diarrhea improved while hospitalized but has returned over the past 2 or 3 days.  He cannot stop soiling his underpants.  Denies abdominal pain, no fever, no nausea vomiting, no chest pain or shortness of breath.  The diarrhea seemed a bit darker than normal as of late.  Review of Systems  A thorough review of systems was obtained and all systems are negative except as noted in the HPI and PMH.   Patient's Health History    Past Medical History:  Diagnosis Date   Allergic rhinitis    Arthritis    Asthma    Childhood   Atrial fibrillation ([place])    Remote history - WARCEF   Bladder cancer ([place])    BPH (benign prostatic hyperplasia)    Cardiomyopathy ([place])    CHF (congestive heart failure) ([place])    Coronary atherosclerosis of native coronary artery    Multivessel s/p CABG in 2002 post-IMI, LVEF 35% up to 50% postoperatively;   Depression    Difficulty sleeping    Erectile dysfunction    Essential hypertension    Frequency of urination    History of bipolar disorder    Hyperlipidemia    IBS (irritable bowel syndrome)    Myocardial infarction ([place]) 2002    Past Surgical History:  Procedure Laterality Date   BIOPSY  03/28/2018   Procedure: BIOPSY;  Surgeon: Rogene Houston, MD;  [facility];  Service: Endoscopy;;  esophagus   BIOPSY  03/09/2021   Procedure: BIOPSY;  Surgeon: Harvel Quale, MD;  [facility];  Service: Gastroenterology;;   BIV PACEMAKER INSERTION CRT-P N/A  05/12/2020   Procedure: BIV PACEMAKER INSERTION CRT-P;  Surgeon: Thompson Grayer, MD;  Location: [facility];  Service: Cardiovascular;  Laterality: N/A;   CATARACT EXTRACTION W/PHACO Left 12/29/2014   Procedure: CATARACT EXTRACTION PHACO AND INTRAOCULAR LENS PLACEMENT (IOC);  Surgeon: Williams Che, MD;  [facility];  Service: Ophthalmology;  Laterality: Left;  CDE:3.71   CATARACT EXTRACTION W/PHACO Right 03/30/2015   Procedure: CATARACT EXTRACTION PHACO AND INTRAOCULAR LENS PLACEMENT RIGHT EYE CDE=2.56;  Surgeon: Williams Che, MD;  [facility];  Service: Ophthalmology;  Laterality: Right;   CIRCUMCISION  10/11/2003   COLONOSCOPY N/A 07/11/2012   rehman,transverse colon polyp (benign lymphoid); tortuous colon; prep adequate with much suction/lavage; hemorrhoids.   CORONARY ARTERY BYPASS GRAFT  08/10/2000   Dr. Servando Snare - LIMA to LAD, SVG to diagonal, SVG to PDA TRIPLE BYPASS   CYSTOSCOPY W/ RETROGRADES Bilateral 03/02/2015   Procedure: CYSTOSCOPY WITH RIGHT RETROGRADE PYELOGRAM,  ATTEMPTED LEFT RETROGRADE PYELOGRAM;  Surgeon: Cleon Gustin, MD;  [facility];  Service: Urology;  Laterality: Bilateral;   ESOPHAGEAL DILATION N/A 03/28/2018   Procedure: ESOPHAGEAL DILATION;  Surgeon: Rogene Houston, MD;  [facility];  Service: Endoscopy;  Laterality: N/A;   ESOPHAGOGASTRODUODENOSCOPY N/A 03/28/2018   rehman,Abnormal esophageal motility. mild schatzki ring at GEJ, dilated, patch of salmon colored mucosa at distal esophagus. Barrett's esophagus. 2cm HH.  erosive gastropathy, normal pylorus, duodenal erosions w/o bleeding. normal second portion of duodenum   ESOPHAGOGASTRODUODENOSCOPY (EGD) WITH PROPOFOL N/A 03/09/2021   Procedure: ESOPHAGOGASTRODUODENOSCOPY (EGD) WITH PROPOFOL;  Surgeon: Harvel Quale, MD;  [facility];  Service: Gastroenterology;  Laterality: N/A;  945   TEMPORARY PACEMAKER N/A 05/12/2020   Procedure: TEMPORARY  PACEMAKER;  Surgeon: Martinique, Peter M, MD;  Location: [facility];  Service: Cardiovascular;  Laterality: N/A;   TONSILLECTOMY     TRANSURETHRAL RESECTION OF BLADDER TUMOR N/A 01/26/2015   Procedure: TRANSURETHRAL RESECTION OF BLADDER TUMOR (TURBT);  Surgeon: Cleon Gustin, MD;  [facility];  Service: Urology;  Laterality: N/A;   TRANSURETHRAL RESECTION OF BLADDER TUMOR WITH GYRUS (TURBT-GYRUS) N/A 03/02/2015   Procedure: TRANSURETHRAL RESECTION OF BLADDER TUMOR WITH GYRUS (TURBT-GYRUS);  Surgeon: Cleon Gustin, MD;  [facility];  Service: Urology;  Laterality: N/A;   TRANSURETHRAL RESECTION OF PROSTATE N/A 01/26/2015   Procedure: TRANSURETHRAL RESECTION OF THE PROSTATE WITH GYRUS INSTRUMENTS;  Surgeon: Cleon Gustin, MD;  [facility];  Service: Urology;  Laterality: N/A;    Family History  Problem Relation Age of Onset   Asthma Mother    Heart attack Father 8   Diabetes Brother     Social History   Socioeconomic History   Marital status: Widowed    Spouse name: Not on file   Number of children: Not on file   Years of education: Not on file   Highest education level: Not on file  Occupational History   Not on file  Tobacco Use   Smoking status: Former    Packs/day: 1.00    Years: 15.00    Total pack years: 15.00    Types: Cigarettes    Quit date: 01/11/1971    Years since quitting: 50.5    Passive exposure: Past   Smokeless tobacco: Never  Vaping Use   Vaping Use: Never used  Substance and Sexual Activity   Alcohol use: Not Currently    Comment: occ.   Drug use: No   Sexual activity: Not Currently  Other Topics Concern   Not on file  Social History Narrative   Married with no children    Exercises 4 times weekly   Caffeine once a day   Social Determinants of Health   Financial Resource Strain: Not on file  Food Insecurity: Not on file  Transportation Needs: Not on file  Physical Activity: Not on file  Stress: Not on file   Social Connections: Not on file  Intimate Partner Violence: Not on file     Physical Exam   Vitals:   07/11/21 0030 07/11/21 0040  BP: 121/70 121/70  Pulse: 73 73  Resp:  18  Temp:  98 F (36.7 C)  SpO2: 97% 97%    CONSTITUTIONAL: Chronically ill-appearing, NAD NEURO/PSYCH:  Alert and oriented x 3, no focal deficits EYES:  eyes equal and reactive ENT/NECK:  no LAD, no JVD CARDIO: Regular rate, well-perfused, normal S1 and S2 PULM:  CTAB no wheezing or rhonchi GI/GU:  non-distended, non-tender MSK/SPINE:  No gross deformities, no edema SKIN:  no rash, atraumatic   *Additional and/or pertinent findings included in MDM below  Diagnostic and Interventional Summary    EKG Interpretation  Date/Time:    Ventricular Rate:    PR Interval:    QRS Duration:   QT Interval:    QTC Calculation:   R Axis:     Text Interpretation:  Labs Reviewed  CBC - Abnormal; Notable for the following components:      Result Value   RBC 3.70 (*)    Hemoglobin 12.0 (*)    HCT 35.1 (*)    Platelets 133 (*)    All other components within normal limits  COMPREHENSIVE METABOLIC PANEL - Abnormal; Notable for the following components:   Glucose, Bld 136 (*)    BUN 36 (*)    Creatinine, Ser 1.46 (*)    Calcium 8.1 (*)    Total Protein 6.3 (*)    GFR, Estimated 48 (*)    All other components within normal limits  C DIFFICILE QUICK SCREEN W PCR REFLEX    GASTROINTESTINAL PANEL BY PCR, STOOL (REPLACES STOOL CULTURE)  PROTIME-INR  MAGNESIUM    No orders to display    Medications  lactated ringers bolus 1,000 mL (0 mLs Intravenous Stopped 07/11/21 0126)     Procedures  /  Critical Care Procedures  ED Course and Medical Decision Making  Initial Impression and Ddx Diarrhea, acute on chronic, no other complaints.  Potentially darker in color than normal, and so upper GI bleed is considered.  Electrolyte disturbance also considered with any persistent diarrhea.  Patient's initial  blood pressure is 90/54, lower than his baseline.  On my initial assessment it is about 120/80 without intervention and he is well-appearing, well-perfused, abdomen completely soft and nontender, no indication for imaging.  Will check labs and reassess.  Suspect this is more of a chronic issue and he needs continued follow-up with GI.  Past medical/surgical history that increases complexity of ED encounter: A-fib, cancer, cardiomyopathy  Interpretation of Diagnostics I personally reviewed the laboratory assessment and my interpretation is as follows: No significant blood count or electrolyte disturbance, minimal elevation in creatinine    Patient Reassessment and Ultimate Disposition/Management     Patient continues to look well with normal vital signs, did not have any further episodes of soft blood pressure or hypotension.  Continued soft and nontender abdomen, continues to deny any pain.  He does not think he will be able to provide a stool sample here in the emergency department.  No emergent process, can follow-up with his regular doctors.  Patient management required discussion with the following services or consulting groups:  None  Complexity of Problems Addressed Chronic illness with exacerbation  Additional Data Reviewed and Analyzed Further history obtained from: Recent discharge summary  Additional Factors Impacting ED Encounter Risk None  Barth Kirks. [place] Small, [place] [email or web address]  Final Clinical Impressions(s) / ED Diagnoses     ICD-10-CM   1. Diarrhea, unspecified type  R19.7       ED Discharge Orders     None        Discharge Instructions Discussed with and Provided to Patient:     Discharge Instructions      You were evaluated in the Emergency Department and after careful evaluation, we did not find any emergent condition requiring admission or further testing in the hospital.  Your  exam/testing today is overall reassuring.  Recommend following up with your regular doctors to further discuss your diarrhea.  Important that you keep up with your fluid losses at home.  You should discuss C. difficile or stool testing with your regular doctors.  Please return to the Emergency Department if you experience any worsening of your condition.   Thank you for allowing [place] to be a part of your care.  Maudie Flakes, MD 07/11/21 [phone]

## 2021-07-15 ENCOUNTER — Emergency Department (HOSPITAL_COMMUNITY): Payer: PPO

## 2021-07-15 ENCOUNTER — Encounter (HOSPITAL_COMMUNITY): Payer: Self-pay | Admitting: Emergency Medicine

## 2021-07-15 ENCOUNTER — Other Ambulatory Visit: Payer: Self-pay

## 2021-07-15 ENCOUNTER — Observation Stay (HOSPITAL_COMMUNITY)
Admission: EM | Admit: 2021-07-15 | Discharge: 2021-07-16 | Disposition: A | Discharge status: Home Health | Payer: PPO | Attending: Internal Medicine | Admitting: Internal Medicine

## 2021-07-15 DIAGNOSIS — W1781XA Fall down embankment (hill), initial encounter: Secondary | ICD-10-CM | POA: Diagnosis not present

## 2021-07-15 DIAGNOSIS — E782 Mixed hyperlipidemia: Secondary | ICD-10-CM | POA: Diagnosis not present

## 2021-07-15 DIAGNOSIS — I110 Hypertensive heart disease with heart failure: Secondary | ICD-10-CM | POA: Insufficient documentation

## 2021-07-15 DIAGNOSIS — Z79899 Other long term (current) drug therapy: Secondary | ICD-10-CM | POA: Insufficient documentation

## 2021-07-15 DIAGNOSIS — I700 Atherosclerosis of aorta: Secondary | ICD-10-CM | POA: Diagnosis not present

## 2021-07-15 DIAGNOSIS — Z87891 Personal history of nicotine dependence: Secondary | ICD-10-CM | POA: Insufficient documentation

## 2021-07-15 DIAGNOSIS — I2510 Atherosclerotic heart disease of native coronary artery without angina pectoris: Secondary | ICD-10-CM | POA: Diagnosis not present

## 2021-07-15 DIAGNOSIS — E538 Deficiency of other specified B group vitamins: Secondary | ICD-10-CM | POA: Insufficient documentation

## 2021-07-15 DIAGNOSIS — E860 Dehydration: Secondary | ICD-10-CM | POA: Insufficient documentation

## 2021-07-15 DIAGNOSIS — G9341 Metabolic encephalopathy: Secondary | ICD-10-CM | POA: Diagnosis not present

## 2021-07-15 DIAGNOSIS — S199XXA Unspecified injury of neck, initial encounter: Secondary | ICD-10-CM | POA: Diagnosis not present

## 2021-07-15 DIAGNOSIS — Z7982 Long term (current) use of aspirin: Secondary | ICD-10-CM | POA: Diagnosis not present

## 2021-07-15 DIAGNOSIS — R4182 Altered mental status, unspecified: Secondary | ICD-10-CM | POA: Diagnosis not present

## 2021-07-15 DIAGNOSIS — Z950 Presence of cardiac pacemaker: Secondary | ICD-10-CM | POA: Insufficient documentation

## 2021-07-15 DIAGNOSIS — Z8551 Personal history of malignant neoplasm of bladder: Secondary | ICD-10-CM | POA: Insufficient documentation

## 2021-07-15 DIAGNOSIS — S299XXA Unspecified injury of thorax, initial encounter: Secondary | ICD-10-CM | POA: Diagnosis not present

## 2021-07-15 DIAGNOSIS — S3991XA Unspecified injury of abdomen, initial encounter: Secondary | ICD-10-CM | POA: Diagnosis not present

## 2021-07-15 DIAGNOSIS — R410 Disorientation, unspecified: Secondary | ICD-10-CM | POA: Diagnosis not present

## 2021-07-15 DIAGNOSIS — J45909 Unspecified asthma, uncomplicated: Secondary | ICD-10-CM | POA: Insufficient documentation

## 2021-07-15 DIAGNOSIS — W19XXXA Unspecified fall, initial encounter: Secondary | ICD-10-CM

## 2021-07-15 DIAGNOSIS — I25810 Atherosclerosis of coronary artery bypass graft(s) without angina pectoris: Secondary | ICD-10-CM | POA: Diagnosis present

## 2021-07-15 DIAGNOSIS — K219 Gastro-esophageal reflux disease without esophagitis: Secondary | ICD-10-CM | POA: Diagnosis not present

## 2021-07-15 DIAGNOSIS — W57XXXA Bitten or stung by nonvenomous insect and other nonvenomous arthropods, initial encounter: Secondary | ICD-10-CM | POA: Diagnosis not present

## 2021-07-15 DIAGNOSIS — Z951 Presence of aortocoronary bypass graft: Secondary | ICD-10-CM | POA: Insufficient documentation

## 2021-07-15 DIAGNOSIS — I10 Essential (primary) hypertension: Secondary | ICD-10-CM | POA: Diagnosis not present

## 2021-07-15 DIAGNOSIS — N3289 Other specified disorders of bladder: Secondary | ICD-10-CM | POA: Diagnosis not present

## 2021-07-15 DIAGNOSIS — Y92009 Unspecified place in unspecified non-institutional (private) residence as the place of occurrence of the external cause: Secondary | ICD-10-CM

## 2021-07-15 DIAGNOSIS — I4891 Unspecified atrial fibrillation: Secondary | ICD-10-CM | POA: Insufficient documentation

## 2021-07-15 DIAGNOSIS — I5022 Chronic systolic (congestive) heart failure: Secondary | ICD-10-CM | POA: Insufficient documentation

## 2021-07-15 DIAGNOSIS — T1490XA Injury, unspecified, initial encounter: Secondary | ICD-10-CM | POA: Diagnosis not present

## 2021-07-15 DIAGNOSIS — K529 Noninfective gastroenteritis and colitis, unspecified: Secondary | ICD-10-CM | POA: Diagnosis not present

## 2021-07-15 DIAGNOSIS — Z7984 Long term (current) use of oral hypoglycemic drugs: Secondary | ICD-10-CM | POA: Insufficient documentation

## 2021-07-15 DIAGNOSIS — R911 Solitary pulmonary nodule: Secondary | ICD-10-CM | POA: Diagnosis not present

## 2021-07-15 DIAGNOSIS — K449 Diaphragmatic hernia without obstruction or gangrene: Secondary | ICD-10-CM | POA: Diagnosis not present

## 2021-07-15 LAB — URINALYSIS, ROUTINE W REFLEX MICROSCOPIC
Bilirubin Urine: NEGATIVE
Glucose, UA: 150 mg/dL — AB
Hgb urine dipstick: NEGATIVE
Ketones, ur: 20 mg/dL — AB
Leukocytes,Ua: NEGATIVE
Nitrite: NEGATIVE
Protein, ur: NEGATIVE mg/dL
Specific Gravity, Urine: 1.024 (ref 1.005–1.030)
pH: 5 (ref 5.0–8.0)

## 2021-07-15 LAB — CBC
HCT: 39.3 % (ref 39.0–52.0)
Hemoglobin: 12.9 g/dL — ABNORMAL LOW (ref 13.0–17.0)
MCH: 30.7 pg (ref 26.0–34.0)
MCHC: 32.8 g/dL (ref 30.0–36.0)
MCV: 93.6 fL (ref 80.0–100.0)
Platelets: 129 10*3/uL — ABNORMAL LOW (ref 150–400)
RBC: 4.2 MIL/uL — ABNORMAL LOW (ref 4.22–5.81)
RDW: 13.2 % (ref 11.5–15.5)
WBC: 12 10*3/uL — ABNORMAL HIGH (ref 4.0–10.5)
nRBC: 0 % (ref 0.0–0.2)

## 2021-07-15 LAB — CK: Total CK: 772 U/L — ABNORMAL HIGH (ref 49–397)

## 2021-07-15 LAB — COMPREHENSIVE METABOLIC PANEL
ALT: 26 U/L (ref 0–44)
AST: 39 U/L (ref 15–41)
Albumin: 3.8 g/dL (ref 3.5–5.0)
Alkaline Phosphatase: 76 U/L (ref 38–126)
Anion gap: 8 (ref 5–15)
BUN: 24 mg/dL — ABNORMAL HIGH (ref 8–23)
CO2: 23 mmol/L (ref 22–32)
Calcium: 8.5 mg/dL — ABNORMAL LOW (ref 8.9–10.3)
Chloride: 103 mmol/L (ref 98–111)
Creatinine, Ser: 1.07 mg/dL (ref 0.61–1.24)
GFR, Estimated: >60 mL/min (ref >60)
Glucose, Bld: 87 mg/dL (ref 70–99)
Potassium: 4.1 mmol/L (ref 3.5–5.1)
Sodium: 134 mmol/L — ABNORMAL LOW (ref 135–145)
Total Bilirubin: 0.9 mg/dL (ref 0.3–1.2)
Total Protein: 6.7 g/dL (ref 6.5–8.1)

## 2021-07-15 LAB — LACTIC ACID, PLASMA
Lactic Acid, Venous: 1 mmol/L (ref 0.5–1.9)
Lactic Acid, Venous: 1 mmol/L (ref 0.5–1.9)

## 2021-07-15 LAB — AMMONIA: Ammonia: 24 umol/L (ref 9–35)

## 2021-07-15 MED ORDER — OXYCODONE HCL 5 MG PO TABS
5.0000 mg | ORAL_TABLET | ORAL | Status: DC | PRN
  Administered 2021-07-16: 5 mg via ORAL
  Filled 2021-07-15: qty 1

## 2021-07-15 MED ORDER — DIVALPROEX SODIUM ER 250 MG PO TB24
250.0000 mg | ORAL_TABLET | Freq: Every day | ORAL | Status: DC
  Administered 2021-07-15 – 2021-07-16 (×2): 250 mg via ORAL
  Filled 2021-07-15 (×6): qty 1

## 2021-07-15 MED ORDER — ACETAMINOPHEN 325 MG PO TABS: 650.0000 mg | ORAL_TABLET | Freq: Four times a day (QID) | ORAL | Status: DC | PRN

## 2021-07-15 MED ORDER — ONDANSETRON HCL 4 MG PO TABS: 4.0000 mg | ORAL_TABLET | Freq: Four times a day (QID) | ORAL | Status: DC | PRN

## 2021-07-15 MED ORDER — ASPIRIN 81 MG PO TBEC
81.0000 mg | DELAYED_RELEASE_TABLET | Freq: Every day | ORAL | Status: DC
  Administered 2021-07-15 – 2021-07-16 (×2): 81 mg via ORAL
  Filled 2021-07-15 (×2): qty 1

## 2021-07-15 MED ORDER — SODIUM CHLORIDE 0.9 % IV SOLN: INTRAVENOUS | Status: DC

## 2021-07-15 MED ORDER — THIAMINE HCL 100 MG PO TABS
100.0000 mg | ORAL_TABLET | Freq: Every day | ORAL | Status: DC
  Administered 2021-07-16: 100 mg via ORAL
  Filled 2021-07-15: qty 1

## 2021-07-15 MED ORDER — MORPHINE SULFATE (PF) 2 MG/ML IV SOLN: 2.0000 mg | INTRAVENOUS | Status: DC | PRN

## 2021-07-15 MED ORDER — IOHEXOL 300 MG/ML  SOLN
100.0000 mL | Freq: Once | INTRAMUSCULAR | Status: AC | PRN
  Administered 2021-07-15: 100 mL via INTRAVENOUS

## 2021-07-15 MED ORDER — DOXYCYCLINE HYCLATE 100 MG PO TABS
200.0000 mg | ORAL_TABLET | Freq: Once | ORAL | Status: AC
Start: 2021-07-15 — End: 2021-07-15
  Administered 2021-07-15: 200 mg via ORAL
  Filled 2021-07-15: qty 2

## 2021-07-15 MED ORDER — CARBAMAZEPINE 200 MG PO TABS
400.0000 mg | ORAL_TABLET | Freq: Two times a day (BID) | ORAL | Status: DC
  Administered 2021-07-15 – 2021-07-16 (×2): 400 mg via ORAL
  Filled 2021-07-15 (×2): qty 2

## 2021-07-15 MED ORDER — TAMSULOSIN HCL 0.4 MG PO CAPS
0.4000 mg | ORAL_CAPSULE | Freq: Every day | ORAL | Status: DC
  Administered 2021-07-15: 0.4 mg via ORAL
  Filled 2021-07-15: qty 1

## 2021-07-15 MED ORDER — PANTOPRAZOLE SODIUM 40 MG PO TBEC
40.0000 mg | DELAYED_RELEASE_TABLET | Freq: Every day | ORAL | Status: DC
  Administered 2021-07-16: 40 mg via ORAL
  Filled 2021-07-15: qty 1

## 2021-07-15 MED ORDER — SACUBITRIL-VALSARTAN 24-26 MG PO TABS
1.0000 | ORAL_TABLET | Freq: Two times a day (BID) | ORAL | Status: DC
  Administered 2021-07-15 – 2021-07-16 (×2): 1 via ORAL
  Filled 2021-07-15 (×2): qty 1

## 2021-07-15 MED ORDER — HEPARIN SODIUM (PORCINE) 5000 UNIT/ML IJ SOLN
5000.0000 [IU] | Freq: Three times a day (TID) | INTRAMUSCULAR | Status: DC
Start: 2021-07-15 — End: 2021-07-17
  Administered 2021-07-15 – 2021-07-16 (×3): 5000 [IU] via SUBCUTANEOUS
  Filled 2021-07-15 (×3): qty 1

## 2021-07-15 MED ORDER — ATORVASTATIN CALCIUM 40 MG PO TABS
80.0000 mg | ORAL_TABLET | Freq: Every day | ORAL | Status: DC
  Administered 2021-07-15: 80 mg via ORAL
  Filled 2021-07-15: qty 2

## 2021-07-15 MED ORDER — BISOPROLOL FUMARATE 5 MG PO TABS
2.5000 mg | ORAL_TABLET | Freq: Every day | ORAL | Status: DC
  Administered 2021-07-15 – 2021-07-16 (×2): 2.5 mg via ORAL
  Filled 2021-07-15 (×2): qty 1

## 2021-07-15 MED ORDER — ONDANSETRON HCL 4 MG/2ML IJ SOLN: 4.0000 mg | Freq: Four times a day (QID) | INTRAMUSCULAR | Status: DC | PRN

## 2021-07-15 MED ORDER — ADULT MULTIVITAMIN W/MINERALS CH
1.0000 | ORAL_TABLET | Freq: Every day | ORAL | Status: DC
  Administered 2021-07-16: 1 via ORAL
  Filled 2021-07-15: qty 1

## 2021-07-15 MED ORDER — LORAZEPAM 0.5 MG PO TABS
0.5000 mg | ORAL_TABLET | Freq: Every evening | ORAL | Status: DC | PRN
  Administered 2021-07-15: 0.5 mg via ORAL
  Filled 2021-07-15: qty 1

## 2021-07-15 MED ORDER — ACETAMINOPHEN 650 MG RE SUPP: 650.0000 mg | Freq: Four times a day (QID) | RECTAL | Status: DC | PRN

## 2021-07-15 MED ORDER — FAMOTIDINE 20 MG PO TABS
40.0000 mg | ORAL_TABLET | Freq: Every day | ORAL | Status: DC
  Administered 2021-07-15: 40 mg via ORAL
  Filled 2021-07-15: qty 2

## 2021-07-15 MED ORDER — SODIUM CHLORIDE 0.9 % IV BOLUS
250.0000 mL | Freq: Once | INTRAVENOUS | Status: AC
  Administered 2021-07-15: 250 mL via INTRAVENOUS

## 2021-07-15 NOTE — Assessment & Plan Note (Signed)
Continue statin. 

## 2021-07-15 NOTE — Assessment & Plan Note (Signed)
-   Was initially oriented only to self at presentation -Currently oriented to person place and time -Subtly confused still with rambling and loss of train of thought that somewhat normal -CT head is negative for acute findings -No focal deficits -Rule out other metabolic etiologies including: ammonia, TSH, R10, folic acid, thiamine abnormalities -Observe overnight

## 2021-07-15 NOTE — Assessment & Plan Note (Signed)
-   Continue Pepcid and PPI 

## 2021-07-15 NOTE — Assessment & Plan Note (Signed)
Continue beta blacker and entresto

## 2021-07-15 NOTE — ED Notes (Signed)
Pacemaker interrogated. Grant Stevens from [facility] called and stated that pt pacemaker was working fine and no arrhthymias were seen .

## 2021-07-15 NOTE — ED Triage Notes (Signed)
Ems was called by a day camp due to pt walking into camp falling and confused. Pt arrived alert/c collar, cbg 98 and bruising noted to bue, right mid back and back of head. Pupils perrla. Ble swelling noted. Pt is oriented to most but per ems pt was confused and unaware of what was going  on when ems got there.

## 2021-07-15 NOTE — Assessment & Plan Note (Signed)
-   1 to creatinine ratio is 24:1.07 -Secondary to poor p.o. intake due to barriers to access to water while patient was out in the elements -Hold lasix -Gentle IV hydration -Trend in the AM, monitor clinical response

## 2021-07-15 NOTE — ED Provider Notes (Signed)
[facility] EMERGENCY DEPARTMENT Provider Note   CSN: [id number] Arrival date & time: 07/15/21  1519     History  Chief Complaint  Patient presents with   Altered Mental Status    Grant Stevens is a 81 y.o. male.  HPI  Patient with medical history including CAD with CABG 2010, A-fib currently on Eliquis, bladder cancer, CHF, status post pacemaker presents  complaints of a fall.  Patient states that he went for a walk on a hike states that he tripped on the gravel, the fall down embankment, he thinks he hit the back of his head, he is unclear if he lost conscious.  He states that he tried to get up and then fell again, he states at that point it was getting dark outside and believes he slept in a pile of leaves.  He states that he was able to crawl for help.  He states that he has no complaints at this time, he denies any headaches change in vision paresthesias or weakness of her lower extremities denies any neck pain back pain chest pain or abdominal pain.  Patient's partner was at bedside and states that she last spoke with him yesterday at 3 PM, at that point the patient told her that he was going for a walk, she endorses that typically the patient will call her before he goes to bed which she did not last night.  She states that she went to check in on him today noted that his car was not in the driveway and he did not pick up the newspaper.  She is concerned that he never made at home.  She does endorse that he is not at his normal baseline.  She states that he lives home alone  Home Medications Prior to Admission medications   Medication Sig Start Date End Date Taking? Authorizing Provider  aspirin EC 81 MG tablet Take 1 tablet (81 mg total) by mouth daily. 03/31/18  Yes Rehman, Mechele Dawley, MD  atorvastatin (LIPITOR) 80 MG tablet Take 80 mg by mouth at bedtime.     Yes [provider]  bisoprolol (ZEBETA) 5 MG tablet Take 0.5 tablets (2.5 mg total) by mouth daily. 12/18/20   Yes [name], MD  carbamazepine (TEGRETOL) 200 MG tablet Take 400 mg by mouth 2 (two) times daily.    Yes [provider]  divalproex (DEPAKOTE ER) 250 MG 24 hr tablet Take 250 mg by mouth daily. 03/12/18  Yes [provider]  empagliflozin (JARDIANCE) 10 MG TABS tablet Take 1 tablet (10 mg total) by mouth daily before breakfast. 07/06/21  Yes Ercole, Clanford L, MD  famotidine (PEPCID) 40 MG tablet TAKE (1) TABLET BY MOUTH AT BEDTIME. Patient taking differently: Take 40 mg by mouth at bedtime. 02/01/21  Yes Carlan, Chelsea L, NP  furosemide (LASIX) 40 MG tablet Take 0.5-1 tablets (20-40 mg total) by mouth daily. '20mg'$  one day then '40mg'$  the other day alternating '20mg'$ -'40mg'$  07/05/21  Yes Rabelo, Clanford L, MD  LORazepam (ATIVAN) 0.5 MG tablet Take 0.5 mg by mouth at bedtime.   Yes [provider]  pantoprazole (PROTONIX) 40 MG tablet Take 1 tablet (40 mg total) by mouth daily before breakfast. 07/08/21  Yes Carlan, Chelsea L, NP  sacubitril-valsartan (ENTRESTO) 24-26 MG Take 1 tablet by mouth 2 (two) times daily. 11/23/20  Yes [name], MD  silodosin (RAPAFLO) 8 MG CAPS capsule Take 1 capsule (8 mg total) by mouth daily with breakfast. 07/07/21  Yes McKenzie, Candee Furbish, MD  tamsulosin (FLOMAX) 0.4 MG CAPS capsule Take 1 capsule (0.4 mg total) by mouth at bedtime. 07/07/20  Yes McKenzie, Candee Furbish, MD  temazepam (RESTORIL) 15 MG capsule Take 15 mg by mouth at bedtime as needed for sleep.   Yes [provider]      Allergies    Spironolactone    Review of Systems   Review of Systems  Constitutional:  Negative for chills and fever.  Respiratory:  Negative for shortness of breath.   Cardiovascular:  Negative for chest pain.  Gastrointestinal:  Negative for abdominal pain.  Neurological:  Negative for headaches.    Physical Exam Updated Vital Signs BP (!) 159/89   Pulse 82   Temp 98.6 F (37 C) (Oral)   Resp 19   SpO2 98%  Physical  Exam Vitals and nursing note reviewed.  Constitutional:      General: He is not in acute distress.    Appearance: He is not ill-appearing.  HENT:     Head: Normocephalic and atraumatic.     Comments: No deformity of  the head present no raccoon eyes or battle sign noted.    Nose: No congestion.     Mouth/Throat:     Mouth: Mucous membranes are moist.     Pharynx: Oropharynx is clear. No oropharyngeal exudate or posterior oropharyngeal erythema.     Comments: No trismus no torticollis no oral trauma present. Eyes:     Extraocular Movements: Extraocular movements intact.     Conjunctiva/sclera: Conjunctivae normal.     Pupils: Pupils are equal, round, and reactive to light.  Neck:     Comments: Patient is in a c-collar Cardiovascular:     Rate and Rhythm: Normal rate and regular rhythm.     Pulses: Normal pulses.     Heart sounds: No murmur heard.    No friction rub. No gallop.  Pulmonary:     Effort: No respiratory distress.     Breath sounds: No wheezing, rhonchi or rales.     Comments: Chest is nontender Abdominal:     Tenderness: There is abdominal tenderness. There is no right CVA tenderness or left CVA tenderness.     Comments: Abdomen nondistended, soft, has right lower quadrant tenderness without guarding rebound tenderness or peritoneal sign.  Musculoskeletal:     Comments: Spine was palpated was nontender to palpation no step-off deformities noted, no leg shortening no pelvis instability.  He is moving all 4 extremities difficulty.  Skin:    General: Skin is warm and dry.     Comments: Patient  noted abrasions on his knees bilaterally, all superficial, does have ecchymosis noted on his back along ribs 5 and 6 deformities noted.  Neurological:     Mental Status: He is alert.     Comments: Alert and oriented x2, cannot tell me the day of the week or who the president was, no facial asymmetry no difficulty with word finding following two-step commands no unilateral weakness  present.  Psychiatric:        Mood and Affect: Mood normal.     ED Results / Procedures / Treatments   Labs (all labs ordered are listed, but only abnormal results are displayed) Labs Reviewed  CBC - Abnormal; Notable for the following components:      Result Value   WBC 12.0 (*)    RBC 4.20 (*)    Hemoglobin 12.9 (*)    Platelets 129 (*)  All other components within normal limits  COMPREHENSIVE METABOLIC PANEL - Abnormal; Notable for the following components:   Sodium 134 (*)    BUN 24 (*)    Calcium 8.5 (*)    All other components within normal limits  CK - Abnormal; Notable for the following components:   Total CK 772 (*)    All other components within normal limits  LACTIC ACID, PLASMA  URINALYSIS, ROUTINE W REFLEX MICROSCOPIC  LACTIC ACID, PLASMA    EKG EKG Interpretation  Date/Time:  Thursday July 15 2021 15:40:32 EDT Ventricular Rate:  83 PR Interval:  258 QRS Duration: 196 QT Interval:  402 QTC Calculation: 473 R Axis:   -57 Text Interpretation: probable atrial sensed V pacing Ventricular premature complex Prolonged PR interval Nonspecific IVCD with LAD Abnormal T, consider ischemia, lateral leads since last tracing no significant change Confirmed by Daleen Bo [phone]) on 07/15/2021 3:49:02 PM  Radiology DG Chest Portable 1 View  Result Date: 07/15/2021 CLINICAL DATA:  Trauma.  Fall.  Confusion. EXAM: PORTABLE CHEST 1 VIEW COMPARISON:  01/15/2021 FINDINGS: Left-sided pacemaker in place. Patient is post median sternotomy. Overall low lung volumes. Stable cardiomegaly. Unchanged mediastinal contours. Vague ill-defined opacities in the right lung base and right suprahilar region. No pulmonary edema, large pleural effusion or pneumothorax. On limited assessment, no acute osseous abnormalities are seen. IMPRESSION: 1. Vague ill-defined opacities in the right lung base and right suprahilar region, may be atelectasis or pneumonia. 2. Stable cardiomegaly.  Electronically Signed   By: Keith Rake M.D.   On: 07/15/2021 17:33    Procedures Procedures    Medications Ordered in ED Medications  sodium chloride 0.9 % bolus 250 mL (has no administration in time range)  iohexol (OMNIPAQUE) 300 MG/ML solution 100 mL (100 mLs Intravenous Contrast Given 07/15/21 1838)    ED Course/ Medical Decision Making/ A&P                           Medical Decision Making Amount and/or Complexity of Data Reviewed Labs: ordered. Radiology: ordered.  Risk Prescription drug management.   This patient presents to the ED for concern of fall, this involves an extensive number of treatment options, and is a complaint that carries with it a high risk of complications and morbidity.  The differential diagnosis includes intracranial head bleed, intrathoracic/intra-abdominal trauma, rhabdo    Additional history obtained:  Additional history obtained from partner at bedside External records from outside source obtained and reviewed including previous hospitalization   Co morbidities that complicate the patient evaluation  CABG, anticoagulated  Social Determinants of Health:  Geriatric    Lab Tests:  I Ordered, and personally interpreted labs.  The pertinent results include: CBC shows leukocytosis of 12.0, normocytic anemia hemoglobin 12.9, CMP shows BUN of 24 calcium 8.5, CK is 772 lactic 1.0   Imaging Studies ordered:  I ordered imaging studies including CT head, CT C-spine, CT lumbar spine, CT chest abdomen pelvis, DG of chest I independently visualized and interpreted imaging which showed chest x-ray unremarkable, CT scans pending at this time I agree with the radiologist interpretation   Cardiac Monitoring:  The patient was maintained on a cardiac monitor.  I personally viewed and interpreted the cardiac monitored which showed an underlying rhythm of: EKG without signs of ischemia   Medicines ordered and prescription drug  management:  I ordered medication including fluids I have reviewed the patients home medicines and have made adjustments as  needed  Critical Interventions:  N/A   Reevaluation:  Presents with a fall, slightly altered, due to his anticoagulant state will obtain pan scans and reassess.  CK is mildly elevated, last EF was last year showed 25 to 30%, will give him very small bolus of fluids.    Consultations Obtained:  N/A    Test Considered:  N/A   Dispostion and problem list  Due to shift change patient may handoff to Kem Parkinson, PA same  Please follow-up on scans, if unremarkable for traumatic injury would recommend admission as he has an elevated CK and is altered.            Final Clinical Impression(s) / ED Diagnoses Final diagnoses:  Altered mental status, unspecified altered mental status type  Fall, initial encounter    Rx / DC Orders ED Discharge Orders     None         Aron Baba 07/15/21 1917    Daleen Bo, MD 07/16/21 1046

## 2021-07-15 NOTE — ED Provider Notes (Signed)
  Face-to-face evaluation   History: Patient presents for evaluation of apparently being outside all night after walking on a trail and apparently falling at some point.  It is suspected that he slept outside overnight because today he was found walking in the cam by strangers.  His friend who usually has contact with him nightly states he did not call her last night and she suspects that he was outside overnight.  Physical exam: Alert, calm, anxious.  Several scratches on arms and legs.  MDM: Evaluation for  Chief Complaint  Patient presents with   Altered Mental Status     Elderly male, with confusion and underlying psychiatric illness, was apparently while he fell.  He apparently slept outside all night.  I suspect that he has early dementia complicating his chronic psychiatric illness.  He does not appear to have any significant injuries.  He will be evaluated and treated, with hopefully outpatient management for his symptoms.  Medical screening examination/treatment/procedure(s) were conducted as a shared visit with non-physician practitioner(s) and myself.  I personally evaluated the patient during the encounter    Daleen Bo, MD 07/16/21 1046

## 2021-07-15 NOTE — Assessment & Plan Note (Signed)
-   CT C-spine and head without acute finding -PT eval and treat -Consider home health as patient does live alone and at least need someone to check on him

## 2021-07-15 NOTE — Assessment & Plan Note (Signed)
-   Chest pain-free -EKG is paced -Continue aspirin, statin, beta-blocker, Entresto -Continue to monitor

## 2021-07-15 NOTE — ED Provider Notes (Signed)
Patient signed out to me by Grant Etienne, PA-C pending CT results  Patient here via EMS found confused and fell in the woods.  Believed to slept in leaves overnight.  Fell down an embankment and unable to get up. Last spoke with significant other yesterday afternoon and told he was going for a walk.  He is anticoagulated on Eliquis for atrial fibrillation  See previous provider note for complete H&P  His work-up this evening mildly elevated CK, no significant leukocytosis, his lactic is unremarkable.   Patient had multiple CTs for evaluation of possible injuries, CT chest abdomen and pelvis, CT C-spine CT head and CT T-spine all without acute abnormality.  Has history of CHF, EF 25-30% in 2022 so small IV fluid bolus was given, since patient is slightly altered, it was felt he would benefit from brief admission for slight altered state.  Discussed findings with [facility] hospitalist, Dr. Clearence Ped who agrees to admit    CT T-SPINE NO CHARGE  Result Date: 07/15/2021 CLINICAL DATA:  Trauma.  Possible fall. EXAM: CT THORACIC SPINE WITH CONTRAST TECHNIQUE: Multiplanar CT images of the thoracic spine were reconstructed from contemporary CT of the Chest. RADIATION DOSE REDUCTION: This exam was performed according to the departmental dose-optimization program which includes automated exposure control, adjustment of the mA and/or kV according to patient size and/or use of iterative reconstruction technique. CONTRAST:  No additional COMPARISON:  CT abdomen and pelvis 02/11/2020. FINDINGS: Alignment: Exaggerated thoracic kyphosis. Upper thoracic dextroscoliosis. No significant listhesis. Vertebrae: Diffuse osteopenia. Mild chronic anterior wedging of the T10-T12 vertebral bodies, unchanged. Schmorl's nodes at T7, T8, and T9. No acute fracture or suspicious osseous lesion is identified. Paraspinal and other soft tissues: No acute abnormality identified in the paraspinal soft tissues. Intrathoracic  contents reported separately. Disc levels: Spondylosis with bridging anterior vertebral osteophytes in the lower thoracic spine. IMPRESSION: No acute thoracic spine fracture. Electronically Signed   By: Logan Bores M.D.   On: 07/15/2021 19:31   CT CHEST ABDOMEN PELVIS W CONTRAST  Result Date: 07/15/2021 CLINICAL DATA:  Confusion, possible fall, trauma EXAM: CT CHEST, ABDOMEN, AND PELVIS WITH CONTRAST TECHNIQUE: Multidetector CT imaging of the chest, abdomen and pelvis was performed following the standard protocol during bolus administration of intravenous contrast. RADIATION DOSE REDUCTION: This exam was performed according to the departmental dose-optimization program which includes automated exposure control, adjustment of the mA and/or kV according to patient size and/or use of iterative reconstruction technique. CONTRAST:  168m OMNIPAQUE IOHEXOL 300 MG/ML  SOLN COMPARISON:  02/11/2020 FINDINGS: CT CHEST FINDINGS Cardiovascular: There is prominent left ventricular dilatation without frank cardiomegaly. No pericardial effusion. Calcification of the mitral valve. Postsurgical changes from coronary artery bypass. Dual lead pacer identified. No evidence of thoracic aortic aneurysm or dissection. Atherosclerosis of the aortic arch and native coronary vessels. Mediastinum/Nodes: No enlarged mediastinal, hilar, or axillary lymph nodes. Thyroid gland, trachea, and esophagus demonstrate no significant findings. Small hiatal hernia. Lungs/Pleura: No acute airspace disease, effusion, or pneumothorax. Stable 4 mm left lower lobe pulmonary nodule reference image 34/504. Central airways are patent. Musculoskeletal: No acute or destructive bony lesions. Reconstructed images demonstrate no additional findings. CT ABDOMEN PELVIS FINDINGS Hepatobiliary: No focal liver abnormality is seen. No gallstones, gallbladder wall thickening, or biliary dilatation. Pancreas: Unremarkable. No pancreatic ductal dilatation or surrounding  inflammatory changes. Spleen: Normal in size without focal abnormality. Adrenals/Urinary Tract: Bilateral renal cortical thinning. Otherwise the kidneys enhance normally and symmetrically. No urinary tract calculi or obstructive uropathy. The adrenals are unremarkable.  Bladder is moderately distended without filling defect. Stomach/Bowel: No bowel obstruction or ileus. Normal appendix right lower quadrant. No bowel wall thickening or inflammatory change. Vascular/Lymphatic: Aortic atherosclerosis. No enlarged abdominal or pelvic lymph nodes. Reproductive: Prostate is unremarkable. Other: No free fluid or free intraperitoneal gas. No abdominal wall hernia. Musculoskeletal: No acute or destructive bony lesions. IMPRESSION: 1. No acute intrathoracic, intra-abdominal, or intrapelvic trauma. 2. Small hiatal hernia. 3. Aortic Atherosclerosis (ICD10-I70.0). Coronary artery atherosclerosis. 4. Stable 4 mm left lower lobe pulmonary nodule, benign given size and long-term stability. No further imaging workup is required. Electronically Signed   By: Randa Ngo M.D.   On: 07/15/2021 19:26   CT Cervical Spine Wo Contrast  Result Date: 07/15/2021 CLINICAL DATA:  Neck trauma.  Confusion.  Possible fall. EXAM: CT CERVICAL SPINE WITHOUT CONTRAST TECHNIQUE: Multidetector CT imaging of the cervical spine was performed without intravenous contrast. Multiplanar CT image reconstructions were also generated. RADIATION DOSE REDUCTION: This exam was performed according to the departmental dose-optimization program which includes automated exposure control, adjustment of the mA and/or kV according to patient size and/or use of iterative reconstruction technique. COMPARISON:  CTA head and neck 10/19/2014 FINDINGS: Alignment: Left convex curvature of the cervical spine. No significant listhesis. Skull base and vertebrae: No acute fracture or suspicious osseous lesion. Soft tissues and spinal canal: No prevertebral fluid or swelling. No  visible canal hematoma. Disc levels: Mild-to-moderate cervical disc and facet degeneration. No high-grade spinal canal stenosis or high-grade osseous neural foraminal stenosis. Upper chest: Reported separately. Other: Prominent calcific atherosclerosis at the carotid bifurcations. IMPRESSION: No acute cervical spine fracture or traumatic subluxation. Electronically Signed   By: Logan Bores M.D.   On: 07/15/2021 19:24   CT Head Wo Contrast  Result Date: 07/15/2021 CLINICAL DATA:  Head trauma.  Confusion. EXAM: CT HEAD WITHOUT CONTRAST TECHNIQUE: Contiguous axial images were obtained from the base of the skull through the vertex without intravenous contrast. RADIATION DOSE REDUCTION: This exam was performed according to the departmental dose-optimization program which includes automated exposure control, adjustment of the mA and/or kV according to patient size and/or use of iterative reconstruction technique. COMPARISON:  CT head 10/19/2014 FINDINGS: Brain: Ventricle size and cerebral volume normal. Negative for acute infarct, hemorrhage, mass. Vascular: Negative for hyperdense vessel Skull: Negative Sinuses/Orbits: Retention cyst left maxillary sinus. Remaining sinuses clear. Bilateral cataract extraction Other: None IMPRESSION: Negative CT head Electronically Signed   By: Franchot Gallo M.D.   On: 07/15/2021 19:22   DG Chest Portable 1 View  Result Date: 07/15/2021 CLINICAL DATA:  Trauma.  Fall.  Confusion. EXAM: PORTABLE CHEST 1 VIEW COMPARISON:  01/15/2021 FINDINGS: Left-sided pacemaker in place. Patient is post median sternotomy. Overall low lung volumes. Stable cardiomegaly. Unchanged mediastinal contours. Vague ill-defined opacities in the right lung base and right suprahilar region. No pulmonary edema, large pleural effusion or pneumothorax. On limited assessment, no acute osseous abnormalities are seen. IMPRESSION: 1. Vague ill-defined opacities in the right lung base and right suprahilar region, may  be atelectasis or pneumonia. 2. Stable cardiomegaly. Electronically Signed   By: Keith Rake M.D.   On: 07/15/2021 17:33      Kem Parkinson, PA-C 07/15/21 2127    Daleen Bo, MD 07/16/21 1047

## 2021-07-15 NOTE — H&P (Signed)
History and Physical    Patient: Grant Stevens TRV:[id number] DOB: [birth year] DOA: 07/15/2021 DOS: the patient was seen and examined on 07/15/2021 PCP: [name], MD  Patient coming from: Home  Chief Complaint:  Chief Complaint  Patient presents with   Altered Mental Status   HPI: Grant Stevens is a 81 y.o. male with medical history significant of asthma, atrial fibrillation, bladder cancer, BPH, CHF with cardiomyopathy, insomnia, hypertension, hyperlipidemia, CAD, and more presents ED with a chief complaint of fall and confusion.  Patient reports that he [place], when he slid on gravel and fell down an embankment.  He hit the back of his head.  He was confused and crawled on his hands and knees to an area to rest, and fell asleep.  He was apparently there all night.  When he woke up in the morning he was still slightly confused and feeling weak.  He is unclear on who found him to bring him to the ER, but ultimately he was brought to the ER because he was found confused in the park.  Patient reports that prior to his fall he had no chest pain, palpitations, dyspnea, dizziness, or shortness of breath.  He reports that this was a mechanical fall simply slipping on gravel.  Patient believes himself back to his baseline at this time.  He still has subtle memory loss and rambling, not answering questions completely appropriately.  He is oriented x3 at this time.  Patient reports that he is in no pain at this time.  He denies any asymmetric weakness, new dysphagia, drooling, change in vision, change in hearing, change in sensation.  Patient does report a history of dysphagia for which she has had esophageal dilation in the past.  He had dilation done a second time in the past week, but has not had any new dysphagia.  Patient reports only blood thinner he is on is on aspirin.  He has no other complaints at this time.  Of note chart review reveals that patient was on Cipro recently.   He reports that this was done after skin procedure to remove warts.  Patient does not smoke, does not drink, does not use illicit drugs.  He is vaccinated for COVID.  Patient is full code.   Review of Systems: As mentioned in the history of present illness. All other systems reviewed and are negative. Past Medical History:  Diagnosis Date   Allergic rhinitis    Arthritis    Asthma    Childhood   Atrial fibrillation ([place])    Remote history - WARCEF   Bladder cancer (HCC)    BPH (benign prostatic hyperplasia)    Cardiomyopathy (HCC)    CHF (congestive heart failure) (HCC)    Coronary atherosclerosis of native coronary artery    Multivessel s/p CABG in 2002 post-IMI, LVEF 35% up to 50% postoperatively;   Depression    Difficulty sleeping    Erectile dysfunction    Essential hypertension    Frequency of urination    History of bipolar disorder    Hyperlipidemia    IBS (irritable bowel syndrome)    Myocardial infarction ([place]) 2002   Past Surgical History:  Procedure Laterality Date   BIOPSY  03/28/2018   Procedure: BIOPSY;  Surgeon: Rogene Houston, MD;  [facility];  Service: Endoscopy;;  esophagus   BIOPSY  03/09/2021   Procedure: BIOPSY;  Surgeon: Harvel Quale, MD;  [facility];  Service: Gastroenterology;;   BIV PACEMAKER INSERTION CRT-P N/A 05/12/2020   Procedure: BIV PACEMAKER INSERTION CRT-P;  Surgeon: Thompson Grayer, MD;  Location: [facility];  Service: Cardiovascular;  Laterality: N/A;   CATARACT EXTRACTION W/PHACO Left 12/29/2014   Procedure: CATARACT EXTRACTION PHACO AND INTRAOCULAR LENS PLACEMENT (IOC);  Surgeon: Williams Che, MD;  Location: [facility];  Service: Ophthalmology;  Laterality: Left;  CDE:3.71   CATARACT EXTRACTION W/PHACO Right 03/30/2015   Procedure: CATARACT EXTRACTION PHACO AND INTRAOCULAR LENS PLACEMENT RIGHT EYE CDE=2.56;  Surgeon: Williams Che, MD;  Location: [facility];  Service: Ophthalmology;   Laterality: Right;   CIRCUMCISION  10/11/2003   COLONOSCOPY N/A 07/11/2012   rehman,transverse colon polyp (benign lymphoid); tortuous colon; prep adequate with much suction/lavage; hemorrhoids.   CORONARY ARTERY BYPASS GRAFT  08/10/2000   Dr. Servando Snare - LIMA to LAD, SVG to diagonal, SVG to PDA TRIPLE BYPASS   CYSTOSCOPY W/ RETROGRADES Bilateral 03/02/2015   Procedure: CYSTOSCOPY WITH RIGHT RETROGRADE PYELOGRAM,  ATTEMPTED LEFT RETROGRADE PYELOGRAM;  Surgeon: Cleon Gustin, MD;  Location: [facility];  Service: Urology;  Laterality: Bilateral;   ESOPHAGEAL DILATION N/A 03/28/2018   Procedure: ESOPHAGEAL DILATION;  Surgeon: Rogene Houston, MD;  [facility];  Service: Endoscopy;  Laterality: N/A;   ESOPHAGOGASTRODUODENOSCOPY N/A 03/28/2018   rehman,Abnormal esophageal motility. mild schatzki ring at GEJ, dilated, patch of salmon colored mucosa at distal esophagus. Barrett's esophagus. 2cm HH. erosive gastropathy, normal pylorus, duodenal erosions w/o bleeding. normal second portion of duodenum   ESOPHAGOGASTRODUODENOSCOPY (EGD) WITH PROPOFOL N/A 03/09/2021   Procedure: ESOPHAGOGASTRODUODENOSCOPY (EGD) WITH PROPOFOL;  Surgeon: Harvel Quale, MD;  [facility];  Service: Gastroenterology;  Laterality: N/A;  945   TEMPORARY PACEMAKER N/A 05/12/2020   Procedure: TEMPORARY PACEMAKER;  Surgeon: Martinique, Peter M, MD;  Location: [facility];  Service: Cardiovascular;  Laterality: N/A;   TONSILLECTOMY     TRANSURETHRAL RESECTION OF BLADDER TUMOR N/A 01/26/2015   Procedure: TRANSURETHRAL RESECTION OF BLADDER TUMOR (TURBT);  Surgeon: Cleon Gustin, MD;  [facility];  Service: Urology;  Laterality: N/A;   TRANSURETHRAL RESECTION OF BLADDER TUMOR WITH GYRUS (TURBT-GYRUS) N/A 03/02/2015   Procedure: TRANSURETHRAL RESECTION OF BLADDER TUMOR WITH GYRUS (TURBT-GYRUS);  Surgeon: Cleon Gustin, MD;  [facility];  Service: Urology;  Laterality: N/A;    TRANSURETHRAL RESECTION OF PROSTATE N/A 01/26/2015   Procedure: TRANSURETHRAL RESECTION OF THE PROSTATE WITH GYRUS INSTRUMENTS;  Surgeon: Cleon Gustin, MD;  [facility];  Service: Urology;  Laterality: N/A;   Social History:  reports that he quit smoking about 50 years ago. His smoking use included cigarettes. He has a 15.00 pack-year smoking history. He has been exposed to tobacco smoke. He has never used smokeless tobacco. He reports that he does not currently use alcohol. He reports that he does not use drugs.  Allergies  Allergen Reactions   Spironolactone Other (See Comments)    Hyperkalemia    Family History  Problem Relation Age of Onset   Asthma Mother    Heart attack Father 76   Diabetes Brother     Prior to Admission medications   Medication Sig Start Date End Date Taking? Authorizing Provider  aspirin EC 81 MG tablet Take 1 tablet (81 mg total) by mouth daily. 03/31/18  Yes Rehman, Mechele Dawley, MD  atorvastatin (LIPITOR) 80 MG tablet Take 80 mg by mouth at bedtime.     Yes [provider]  bisoprolol (ZEBETA)  5 MG tablet Take 0.5 tablets (2.5 mg total) by mouth daily. 12/18/20  Yes [name], MD  carbamazepine (TEGRETOL) 200 MG tablet Take 400 mg by mouth 2 (two) times daily.    Yes [provider]  divalproex (DEPAKOTE ER) 250 MG 24 hr tablet Take 250 mg by mouth daily. 03/12/18  Yes [provider]  empagliflozin (JARDIANCE) 10 MG TABS tablet Take 1 tablet (10 mg total) by mouth daily before breakfast. 07/06/21  Yes Kneeland, Clanford L, MD  famotidine (PEPCID) 40 MG tablet TAKE (1) TABLET BY MOUTH AT BEDTIME. Patient taking differently: Take 40 mg by mouth at bedtime. 02/01/21  Yes Carlan, Chelsea L, NP  furosemide (LASIX) 40 MG tablet Take 0.5-1 tablets (20-40 mg total) by mouth daily. '20mg'$  one day then '40mg'$  the other day alternating '20mg'$ -'40mg'$  07/05/21  Yes Whobrey, Clanford L, MD  LORazepam (ATIVAN) 0.5 MG tablet Take 0.5 mg by mouth at  bedtime.   Yes [provider]  pantoprazole (PROTONIX) 40 MG tablet Take 1 tablet (40 mg total) by mouth daily before breakfast. 07/08/21  Yes Carlan, Chelsea L, NP  sacubitril-valsartan (ENTRESTO) 24-26 MG Take 1 tablet by mouth 2 (two) times daily. 11/23/20  Yes [name], MD  silodosin (RAPAFLO) 8 MG CAPS capsule Take 1 capsule (8 mg total) by mouth daily with breakfast. 07/07/21  Yes McKenzie, Candee Furbish, MD  tamsulosin (FLOMAX) 0.4 MG CAPS capsule Take 1 capsule (0.4 mg total) by mouth at bedtime. 07/07/20  Yes McKenzie, Candee Furbish, MD  temazepam (RESTORIL) 15 MG capsule Take 15 mg by mouth at bedtime as needed for sleep.   Yes [provider]    Physical Exam: Vitals:   07/15/21 2115 07/15/21 2130 07/15/21 2159 07/15/21 2203  BP:  (!) 146/87 (!) 168/84   Pulse: 78 77 86   Resp: '14 16 14   '$ Temp:  98.6 F (37 C) 98.2 F (36.8 C)   TempSrc:      SpO2: 96% 97% 96%   Weight:    96.9 kg   1.  General: Patient lying supine in bed,  no acute distress   2. Psychiatric: Alert and oriented x 3, mood and behavior normal for situation, pleasant and cooperative with exam   3. Neurologic: Speech and language are normal, face is symmetric, moves all 4 extremities voluntarily, equal sensation bilaterally, very subtly confused from baseline without focal deficits   4. HEENMT:  Head is atraumatic, normocephalic, pupils reactive to light, neck is supple, trachea is midline, mucous membranes are moist   5. Respiratory : Lungs are clear to auscultation bilaterally without wheezing, rhonchi, rales, no cyanosis, no increase in work of breathing or accessory muscle use   6. Cardiovascular : Heart rate normal, rhythm is regular, no murmurs, rubs or gallops, no peripheral edema, peripheral pulses palpated   7. Gastrointestinal:  Abdomen is soft, nondistended, nontender to palpation bowel sounds active, no masses or organomegaly palpated   8. Skin:  Bruising on right  forearm, 3 ticks removed at admission   9.Musculoskeletal:  No acute deformities or trauma, no asymmetry in tone, no peripheral edema, peripheral pulses palpated, no tenderness to palpation in the extremities  Data Reviewed: In the ED Temp 98.6, heart rate 80-89, respiratory 16-21, blood pressure 126/74-162/97, satting at 99% Leukocytosis at 12, hemoglobin 12.9 CPK slightly elevated at 772, lactic acid 1.0 CT C-spine shows no acute findings CT chest abdomen pelvis shows no acute intrathoracic intra-abdominal or intrapelvic trauma CT head  is negative for acute findings Chest x-ray shows atelectasis versus pneumonia with stable cardiomegaly -at this time assumed to be atelectasis based on clinical presentation EKG shows atrial sensed pacing Patient does live alone, there is concern for unsafe discharge so admission was requested for observation given recent fall, being out in the a.m. overnight  Assessment and Plan: * Acute metabolic encephalopathy - Was initially oriented only to self at presentation -Currently oriented to person place and time -Subtly confused still with rambling and loss of train of thought that somewhat normal -CT head is negative for acute findings -No focal deficits -Rule out other metabolic etiologies including: ammonia, TSH, B34, folic acid, thiamine abnormalities -Observe overnight  Tick bite - 3 ticks pulled off with admission skin assessment -Prophylactic doxycycline ordered  Fall at home, initial encounter - CT C-spine and head without acute finding -PT eval and treat -Consider home health as patient does live alone and at least need someone to check on him  Dehydration - 1 to creatinine ratio is 24:1.07 -Secondary to poor p.o. intake due to barriers to access to water while patient was out in the elements -Hold lasix -Gentle IV hydration -Trend in the AM, monitor clinical response  GERD (gastroesophageal reflux disease) - Continue Pepcid and  PPI  CAD (coronary artery disease) of artery bypass graft - Chest pain-free -EKG is paced -Continue aspirin, statin, beta-blocker, Entresto -Continue to monitor  Essential hypertension, benign Continue beta blacker and entresto  Mixed hyperlipidemia - Continue statin      Advance Care Planning:   Code Status: Full Code   Consults: None  Family Communication: Significant other and nephew at bedside  Severity of Illness: The appropriate patient status for this patient is OBSERVATION. Observation status is judged to be reasonable and necessary in order to provide the required intensity of service to ensure the patient's safety. The patient's presenting symptoms, physical exam findings, and initial radiographic and laboratory data in the context of their medical condition is felt to place them at decreased risk for further clinical deterioration. Furthermore, it is anticipated that the patient will be medically stable for discharge from the hospital within 2 midnights of admission.   Author: Rolla Plate, DO 07/15/2021 10:30 PM  For on call review [email or web address].

## 2021-07-15 NOTE — Assessment & Plan Note (Signed)
-   3 ticks pulled off with admission skin assessment -Prophylactic doxycycline ordered

## 2021-07-16 DIAGNOSIS — Y92009 Unspecified place in unspecified non-institutional (private) residence as the place of occurrence of the external cause: Secondary | ICD-10-CM | POA: Diagnosis not present

## 2021-07-16 DIAGNOSIS — I10 Essential (primary) hypertension: Secondary | ICD-10-CM | POA: Diagnosis not present

## 2021-07-16 DIAGNOSIS — W57XXXA Bitten or stung by nonvenomous insect and other nonvenomous arthropods, initial encounter: Secondary | ICD-10-CM | POA: Diagnosis not present

## 2021-07-16 DIAGNOSIS — E860 Dehydration: Secondary | ICD-10-CM | POA: Diagnosis not present

## 2021-07-16 DIAGNOSIS — G9341 Metabolic encephalopathy: Secondary | ICD-10-CM | POA: Diagnosis not present

## 2021-07-16 DIAGNOSIS — W19XXXA Unspecified fall, initial encounter: Secondary | ICD-10-CM | POA: Diagnosis not present

## 2021-07-16 LAB — COMPREHENSIVE METABOLIC PANEL
ALT: 20 U/L (ref 0–44)
AST: 29 U/L (ref 15–41)
Albumin: 2.8 g/dL — ABNORMAL LOW (ref 3.5–5.0)
Alkaline Phosphatase: 57 U/L (ref 38–126)
Anion gap: 6 (ref 5–15)
BUN: 17 mg/dL (ref 8–23)
CO2: 21 mmol/L — ABNORMAL LOW (ref 22–32)
Calcium: 7.2 mg/dL — ABNORMAL LOW (ref 8.9–10.3)
Chloride: 109 mmol/L (ref 98–111)
Creatinine, Ser: 0.78 mg/dL (ref 0.61–1.24)
GFR, Estimated: >60 mL/min (ref >60)
Glucose, Bld: 72 mg/dL (ref 70–99)
Potassium: 3.3 mmol/L — ABNORMAL LOW (ref 3.5–5.1)
Sodium: 136 mmol/L (ref 135–145)
Total Bilirubin: 0.6 mg/dL (ref 0.3–1.2)
Total Protein: 5.2 g/dL — ABNORMAL LOW (ref 6.5–8.1)

## 2021-07-16 LAB — CBC WITH DIFFERENTIAL/PLATELET
Abs Immature Granulocytes: 0.02 10*3/uL (ref 0.00–0.07)
Basophils Absolute: 0 10*3/uL (ref 0.0–0.1)
Basophils Relative: 0 %
Eosinophils Absolute: 0.2 10*3/uL (ref 0.0–0.5)
Eosinophils Relative: 2 %
HCT: 33.7 % — ABNORMAL LOW (ref 39.0–52.0)
Hemoglobin: 10.8 g/dL — ABNORMAL LOW (ref 13.0–17.0)
Immature Granulocytes: 0 %
Lymphocytes Relative: 5 %
Lymphs Abs: 0.4 10*3/uL — ABNORMAL LOW (ref 0.7–4.0)
MCH: 30.8 pg (ref 26.0–34.0)
MCHC: 32 g/dL (ref 30.0–36.0)
MCV: 96 fL (ref 80.0–100.0)
Monocytes Absolute: 0.6 10*3/uL (ref 0.1–1.0)
Monocytes Relative: 6 %
Neutro Abs: 7.7 10*3/uL (ref 1.7–7.7)
Neutrophils Relative %: 87 %
Platelets: 107 10*3/uL — ABNORMAL LOW (ref 150–400)
RBC: 3.51 MIL/uL — ABNORMAL LOW (ref 4.22–5.81)
RDW: 13.3 % (ref 11.5–15.5)
WBC: 8.9 10*3/uL (ref 4.0–10.5)
nRBC: 0 % (ref 0.0–0.2)

## 2021-07-16 LAB — TSH: TSH: 2.741 u[IU]/mL (ref 0.350–4.500)

## 2021-07-16 LAB — FOLATE: Folate: 13.2 ng/mL (ref >5.9)

## 2021-07-16 LAB — MAGNESIUM: Magnesium: 1.8 mg/dL (ref 1.7–2.4)

## 2021-07-16 LAB — VITAMIN B12: Vitamin B-12: 214 pg/mL (ref 180–914)

## 2021-07-16 LAB — CK: Total CK: 460 U/L — ABNORMAL HIGH (ref 49–397)

## 2021-07-16 MED ORDER — LOPERAMIDE HCL 2 MG PO CAPS: 2.0000 mg | ORAL_CAPSULE | Freq: Four times a day (QID) | ORAL | 0 refills | Status: DC | PRN

## 2021-07-16 MED ORDER — ORAL CARE MOUTH RINSE
15.0000 mL | OROMUCOSAL | Status: DC | PRN
Start: 2021-07-16 — End: 2021-07-17

## 2021-07-16 MED ORDER — CYANOCOBALAMIN 1000 MCG/ML IJ SOLN
1000.0000 ug | Freq: Once | INTRAMUSCULAR | Status: DC
Start: 2021-07-16 — End: 2021-07-17

## 2021-07-16 MED ORDER — IPRATROPIUM-ALBUTEROL 0.5-2.5 (3) MG/3ML IN SOLN: 3.0000 mL | RESPIRATORY_TRACT | Status: DC | PRN

## 2021-07-16 MED ORDER — POTASSIUM CHLORIDE CRYS ER 20 MEQ PO TBCR: 40.0000 meq | EXTENDED_RELEASE_TABLET | Freq: Once | ORAL | Status: DC

## 2021-07-16 MED ORDER — VITAMIN B-12 1000 MCG PO TABS: 1000.0000 ug | ORAL_TABLET | Freq: Every day | ORAL | 0 refills | Status: DC

## 2021-07-16 NOTE — Progress Notes (Signed)
Nursing Progress Note   Patient seen and assessed this am. No complaints of pain. Patient speaks quite well about past activities and current events. Ate good percentage of his breakfast. Took meds without difficulty.   Vitals:   07/15/21 2159 07/16/21 0409  BP: (!) 168/84 120/66  Pulse: 86 63  Resp: 14 15  Temp: 98.2 F (36.8 C) 98.4 F (36.9 C)  SpO2: 96% 97%    Alfonse Alpers RN, BSN 07/16/2021 10:09 AM

## 2021-07-16 NOTE — TOC Progression Note (Signed)
  Transition of Care [facility]) Screening Note   Patient Details  Name: Grant Stevens Date of Birth: [birth year]   Transition of Care [facility]) CM/SW Contact:    Boneta Lucks, RN Phone Number: 07/16/2021, 10:10 AM  PT/OT eval pending- DC 1-2 days  Transition of Care Department [facility]) has reviewed patient and no TOC needs have been identified at this time. We will continue to monitor patient advancement through interdisciplinary progression rounds. If new patient transition needs arise, please place a TOC consult.       Barriers to Discharge: Continued Medical Work up  Expected Discharge Plan and Services      Living arrangements for the past 2 months: [place]                     Readmission Risk Interventions     No data to display

## 2021-07-16 NOTE — Care Management Obs Status (Signed)
[facility] NOTIFICATION   Patient Details  Name: FADI MENTER MRN: [id number] Date of Birth: 1941/01/10   Medicare Observation Status Notification Given:  Yes    Boneta Lucks, RN 07/16/2021, 3:11 PM

## 2021-07-16 NOTE — Discharge Summary (Signed)
Physician Discharge Summary  DEZ STAUFFER JME:[id number] DOB: 1940/03/27 DOA: 07/15/2021  PCP: [name], MD  Admit date: 07/15/2021 Discharge date: 07/16/2021  Admitted From: Home Disposition: Home  Recommendations for Outpatient Follow-up:  Follow up with PCP in 1-2 weeks Please obtain BMP/CBC in one week Follow-up with psychiatry for bipolar disorder  Home Health: Home health PT Equipment/Devices: Rolling walker  Discharge Condition: Stable CODE STATUS: Full code Diet recommendation: Heart healthy  Brief/Interim Summary: 81 y.o. male with medical history significant of asthma, atrial fibrillation, bladder cancer, BPH, CHF with cardiomyopathy, insomnia, hypertension, hyperlipidemia, CAD, and more presents ED with a chief complaint of fall and confusion.  Patient reports that [place], when he slid on gravel and fell down an embankment.  He hit the back of his head.  He was confused and crawled on his hands and knees to an area to rest, and fell asleep.  He was apparently there all night.  When he woke up in the morning he was still slightly confused and feeling weak.  He is unclear on who found him to bring him to the ER, but ultimately he was brought to the ER because he was found confused in the park.  Patient reports that prior to his fall he had no chest pain, palpitations, dyspnea, dizziness, or shortness of breath.  He reports that this was a mechanical fall simply slipping on gravel.  Patient believes himself back to his baseline at this time.  He still has subtle memory loss and rambling, not answering questions completely appropriately.  He is oriented x3 at this time.  Patient reports that he is in no pain at this time.  He denies any asymmetric weakness, new dysphagia, drooling, change in vision, change in hearing, change in sensation.  Patient does report a history of dysphagia for which she has had esophageal dilation in the past.  He had dilation done a  second time in the past week, but has not had any new dysphagia.  Patient reports only blood thinner he is on is on aspirin.  He has no other complaints at this time.  In addition patient has recently been on Cipro after having skin warts removed  Discharge Diagnoses:  Principal Problem:   Acute metabolic encephalopathy Active Problems:   Mixed hyperlipidemia   Essential hypertension, benign   CAD (coronary artery disease) of artery bypass graft   GERD (gastroesophageal reflux disease)   Dehydration   Fall at home, initial encounter   Tick bite  Acute encephalopathy -Possibly has some degree of dehydration -Was initially oriented only to self on presentation -CT head negative for acute findings -Other metabolic work-up also unrevealing -Received IV fluids with improvement in mental status -Time of discharge, mental status appears to be near baseline.  He is alert and oriented and able to provide a detailed history about his medical problems  Tick bite -Had 3 ticks pulled off of him on admission assessment -No obvious rashes were observed -Received prophylactic dose of doxycycline  Fall prior to admission -Seen by PT with recommendations for home health and rolling walker  Dehydration -Improved with IV fluids  B12 deficiency -Relative deficiency with B12 levels at lower range of normal -Started on replacement therapy  Chronic diarrhea -Unlikely infectious due to chronicity -We will give as needed Imodium until he can follow-up with his gastroenterologist  Bipolar disorder -Continue on Depakote and Tegretol -Appears to have insight into his underlying medical/mental health issues -Recognize behaviors  consistent with manic episode and says that he has not had any of those behaviors recently -Does not appear to be psychotic.  Speech is pressured at times, but appropriate in context and not really tangential -Recommend that he follow-up with his psychiatrist  Chronic  systolic congestive heart failure -Currently appears compensated -Continue home regimen  Discharge Instructions  Discharge Instructions     Diet - low sodium heart healthy   Complete by: As directed    Increase activity slowly   Complete by: As directed       Allergies as of 07/16/2021       Reactions   Spironolactone Other (See Comments)   Hyperkalemia        Medication List     TAKE these medications    aspirin EC 81 MG tablet Take 1 tablet (81 mg total) by mouth daily.   atorvastatin 80 MG tablet Commonly known as: LIPITOR Take 80 mg by mouth at bedtime.   bisoprolol 5 MG tablet Commonly known as: ZEBETA Take 0.5 tablets (2.5 mg total) by mouth daily.   carbamazepine 200 MG tablet Commonly known as: TEGRETOL Take 400 mg by mouth 2 (two) times daily.   divalproex 250 MG 24 hr tablet Commonly known as: DEPAKOTE ER Take 250 mg by mouth daily.   empagliflozin 10 MG Tabs tablet Commonly known as: Jardiance Take 1 tablet (10 mg total) by mouth daily before breakfast.   Entresto 24-26 MG Generic drug: sacubitril-valsartan Take 1 tablet by mouth 2 (two) times daily.   famotidine 40 MG tablet Commonly known as: PEPCID TAKE (1) TABLET BY MOUTH AT BEDTIME. What changed: See the new instructions.   furosemide 40 MG tablet Commonly known as: Lasix Take 0.5-1 tablets (20-40 mg total) by mouth daily. '20mg'$  one day then '40mg'$  the other day alternating '20mg'$ -'40mg'$    loperamide 2 MG capsule Commonly known as: IMODIUM Take 1 capsule (2 mg total) by mouth every 6 (six) hours as needed for diarrhea or loose stools.   LORazepam 0.5 MG tablet Commonly known as: ATIVAN Take 0.5 mg by mouth at bedtime.   pantoprazole 40 MG tablet Commonly known as: PROTONIX Take 1 tablet (40 mg total) by mouth daily before breakfast.   silodosin 8 MG Caps capsule Commonly known as: RAPAFLO Take 1 capsule (8 mg total) by mouth daily with breakfast.   tamsulosin 0.4 MG Caps  capsule Commonly known as: FLOMAX Take 1 capsule (0.4 mg total) by mouth at bedtime.   temazepam 15 MG capsule Commonly known as: RESTORIL Take 15 mg by mouth at bedtime as needed for sleep.   vitamin B-12 1000 MCG tablet Commonly known as: CYANOCOBALAMIN Take 1 tablet (1,000 mcg total) by mouth daily.               Durable Medical Equipment  (From admission, onward)           Start     Ordered   07/16/21 1118  For home use only DME Walker rolling  Once       Question Answer Comment  Walker: With [employer] Wheels   Patient needs a walker to treat with the following condition Abnormality of gait and mobility      07/16/21 1118            Follow-up Information     [facility] Follow up.   Specialty: Home Health Services Why: PT will call to schedule your first home visit. Contact information: [address]  [address] [place] [address] [phone]         [facility] Follow up.   Why: Rolling walker delivered. Contact information: [address]  [address] [phone]         [name], MD. Schedule an appointment as soon as possible for a visit in 1 week(s).   Specialty: Internal Medicine Contact information: [place] [address] [phone]                Allergies  Allergen Reactions   Spironolactone Other (See Comments)    Hyperkalemia    Consultations:    Procedures/Studies: CT T-SPINE NO CHARGE  Result Date: 07/15/2021 CLINICAL DATA:  Trauma.  Possible fall. EXAM: CT THORACIC SPINE WITH CONTRAST TECHNIQUE: Multiplanar CT images of the thoracic spine were reconstructed from contemporary CT of the Chest. RADIATION DOSE REDUCTION: This exam was performed according to the departmental dose-optimization program which includes automated exposure control, adjustment of the mA and/or kV according to patient size and/or use of iterative reconstruction  technique. CONTRAST:  No additional COMPARISON:  CT abdomen and pelvis 02/11/2020. FINDINGS: Alignment: Exaggerated thoracic kyphosis. Upper thoracic dextroscoliosis. No significant listhesis. Vertebrae: Diffuse osteopenia. Mild chronic anterior wedging of the T10-T12 vertebral bodies, unchanged. Schmorl's nodes at T7, T8, and T9. No acute fracture or suspicious osseous lesion is identified. Paraspinal and other soft tissues: No acute abnormality identified in the paraspinal soft tissues. Intrathoracic contents reported separately. Disc levels: Spondylosis with bridging anterior vertebral osteophytes in the lower thoracic spine. IMPRESSION: No acute thoracic spine fracture. Electronically Signed   By: Logan Bores M.D.   On: 07/15/2021 19:31   CT CHEST ABDOMEN PELVIS W CONTRAST  Result Date: 07/15/2021 CLINICAL DATA:  Confusion, possible fall, trauma EXAM: CT CHEST, ABDOMEN, AND PELVIS WITH CONTRAST TECHNIQUE: Multidetector CT imaging of the chest, abdomen and pelvis was performed following the standard protocol during bolus administration of intravenous contrast. RADIATION DOSE REDUCTION: This exam was performed according to the departmental dose-optimization program which includes automated exposure control, adjustment of the mA and/or kV according to patient size and/or use of iterative reconstruction technique. CONTRAST:  152m OMNIPAQUE IOHEXOL 300 MG/ML  SOLN COMPARISON:  02/11/2020 FINDINGS: CT CHEST FINDINGS Cardiovascular: There is prominent left ventricular dilatation without frank cardiomegaly. No pericardial effusion. Calcification of the mitral valve. Postsurgical changes from coronary artery bypass. Dual lead pacer identified. No evidence of thoracic aortic aneurysm or dissection. Atherosclerosis of the aortic arch and native coronary vessels. Mediastinum/Nodes: No enlarged mediastinal, hilar, or axillary lymph nodes. Thyroid gland, trachea, and esophagus demonstrate no significant findings. Small  hiatal hernia. Lungs/Pleura: No acute airspace disease, effusion, or pneumothorax. Stable 4 mm left lower lobe pulmonary nodule reference image 34/504. Central airways are patent. Musculoskeletal: No acute or destructive bony lesions. Reconstructed images demonstrate no additional findings. CT ABDOMEN PELVIS FINDINGS Hepatobiliary: No focal liver abnormality is seen. No gallstones, gallbladder wall thickening, or biliary dilatation. Pancreas: Unremarkable. No pancreatic ductal dilatation or surrounding inflammatory changes. Spleen: Normal in size without focal abnormality. Adrenals/Urinary Tract: Bilateral renal cortical thinning. Otherwise the kidneys enhance normally and symmetrically. No urinary tract calculi or obstructive uropathy. The adrenals are unremarkable. Bladder is moderately distended without filling defect. Stomach/Bowel: No bowel obstruction or ileus. Normal appendix right lower quadrant. No bowel wall thickening or inflammatory change. Vascular/Lymphatic: Aortic atherosclerosis. No enlarged abdominal or pelvic lymph nodes. Reproductive: Prostate is unremarkable. Other: No free fluid or free intraperitoneal gas. No abdominal wall hernia.  Musculoskeletal: No acute or destructive bony lesions. IMPRESSION: 1. No acute intrathoracic, intra-abdominal, or intrapelvic trauma. 2. Small hiatal hernia. 3. Aortic Atherosclerosis (ICD10-I70.0). Coronary artery atherosclerosis. 4. Stable 4 mm left lower lobe pulmonary nodule, benign given size and long-term stability. No further imaging workup is required. Electronically Signed   By: Randa Ngo M.D.   On: 07/15/2021 19:26   CT Cervical Spine Wo Contrast  Result Date: 07/15/2021 CLINICAL DATA:  Neck trauma.  Confusion.  Possible fall. EXAM: CT CERVICAL SPINE WITHOUT CONTRAST TECHNIQUE: Multidetector CT imaging of the cervical spine was performed without intravenous contrast. Multiplanar CT image reconstructions were also generated. RADIATION DOSE REDUCTION:  This exam was performed according to the departmental dose-optimization program which includes automated exposure control, adjustment of the mA and/or kV according to patient size and/or use of iterative reconstruction technique. COMPARISON:  CTA head and neck 10/19/2014 FINDINGS: Alignment: Left convex curvature of the cervical spine. No significant listhesis. Skull base and vertebrae: No acute fracture or suspicious osseous lesion. Soft tissues and spinal canal: No prevertebral fluid or swelling. No visible canal hematoma. Disc levels: Mild-to-moderate cervical disc and facet degeneration. No high-grade spinal canal stenosis or high-grade osseous neural foraminal stenosis. Upper chest: Reported separately. Other: Prominent calcific atherosclerosis at the carotid bifurcations. IMPRESSION: No acute cervical spine fracture or traumatic subluxation. Electronically Signed   By: Logan Bores M.D.   On: 07/15/2021 19:24   CT Head Wo Contrast  Result Date: 07/15/2021 CLINICAL DATA:  Head trauma.  Confusion. EXAM: CT HEAD WITHOUT CONTRAST TECHNIQUE: Contiguous axial images were obtained from the base of the skull through the vertex without intravenous contrast. RADIATION DOSE REDUCTION: This exam was performed according to the departmental dose-optimization program which includes automated exposure control, adjustment of the mA and/or kV according to patient size and/or use of iterative reconstruction technique. COMPARISON:  CT head 10/19/2014 FINDINGS: Brain: Ventricle size and cerebral volume normal. Negative for acute infarct, hemorrhage, mass. Vascular: Negative for hyperdense vessel Skull: Negative Sinuses/Orbits: Retention cyst left maxillary sinus. Remaining sinuses clear. Bilateral cataract extraction Other: None IMPRESSION: Negative CT head Electronically Signed   By: Franchot Gallo M.D.   On: 07/15/2021 19:22   DG Chest Portable 1 View  Result Date: 07/15/2021 CLINICAL DATA:  Trauma.  Fall.  Confusion.  EXAM: PORTABLE CHEST 1 VIEW COMPARISON:  01/15/2021 FINDINGS: Left-sided pacemaker in place. Patient is post median sternotomy. Overall low lung volumes. Stable cardiomegaly. Unchanged mediastinal contours. Vague ill-defined opacities in the right lung base and right suprahilar region. No pulmonary edema, large pleural effusion or pneumothorax. On limited assessment, no acute osseous abnormalities are seen. IMPRESSION: 1. Vague ill-defined opacities in the right lung base and right suprahilar region, may be atelectasis or pneumonia. 2. Stable cardiomegaly. Electronically Signed   By: Keith Rake M.D.   On: 07/15/2021 17:33   DG Abd 1 View  Result Date: 07/02/2021 CLINICAL DATA:  Diarrhea x several days EXAM: ABDOMEN - 1 VIEW COMPARISON:  08/17/2020 FINDINGS: Bowel gas pattern is nonspecific. Small to moderate amount of stool is seen in the colon. There is interval decrease in amount of colonic stool. There is no fecal impaction in the rectosigmoid. No abnormal masses or calcifications are seen. Kidneys are partly obscured by bowel contents. Visualized lower lung fields are clear. There is evidence of previous coronary bypass surgery and placement of cardiac pacemaker. IMPRESSION: Nonspecific bowel gas pattern. Small to moderate stool burden in the colon without fecal impaction in the rectosigmoid. Electronically Signed  By: Elmer Picker M.D.   On: 07/02/2021 18:23      Subjective: Patient is feeling better.  No new complaints  Discharge Exam: Vitals:   07/15/21 2159 07/15/21 2203 07/16/21 0409 07/16/21 1336  BP: (!) 168/84  120/66 107/66  Pulse: 86  63 60  Resp: '14  15 18  '$ Temp: 98.2 F (36.8 C)  98.4 F (36.9 C) 98.5 F (36.9 C)  TempSrc:   Oral Oral  SpO2: 96%  97% 98%  Weight:  96.9 kg    Height:  6' (1.829 m)      General: Pt is alert, awake, not in acute distress Cardiovascular: RRR, S1/S2 +, no rubs, no gallops Respiratory: CTA bilaterally, no wheezing, no  rhonchi Abdominal: Soft, NT, ND, bowel sounds + Extremities: no edema, no cyanosis    The results of significant diagnostics from this hospitalization (including imaging, microbiology, ancillary and laboratory) are listed below for reference.     Microbiology: No results found for this or any previous visit (from the past 240 hour(s)).   Labs: BNP (last 3 results) No results for input(s): "BNP" in the last 8760 hours. Basic Metabolic Panel: Recent Labs  Lab 07/10/21 2348 07/15/21 1732 07/16/21 0449  NA 136 134* 136  K 4.3 4.1 3.3*  CL 103 103 109  CO2 24 23 21*  GLUCOSE 136* 87 72  BUN 36* 24* 17  CREATININE 1.46* 1.07 0.78  CALCIUM 8.1* 8.5* 7.2*  MG 2.0  --  1.8   Liver Function Tests: Recent Labs  Lab 07/10/21 2348 07/15/21 1732 07/16/21 0449  AST 22 39 29  ALT '19 26 20  '$ ALKPHOS 65 76 57  BILITOT 0.4 0.9 0.6  PROT 6.3* 6.7 5.2*  ALBUMIN 3.5 3.8 2.8*   No results for input(s): "LIPASE", "AMYLASE" in the last 168 hours. Recent Labs  Lab 07/15/21 2216  AMMONIA 24   CBC: Recent Labs  Lab 07/10/21 2348 07/15/21 1546 07/16/21 0449  WBC 6.8 12.0* 8.9  NEUTROABS  --   --  7.7  HGB 12.0* 12.9* 10.8*  HCT 35.1* 39.3 33.7*  MCV 94.9 93.6 96.0  PLT 133* 129* 107*   Cardiac Enzymes: Recent Labs  Lab 07/15/21 1732 07/16/21 0843  CKTOTAL 772* 460*   BNP: Invalid input(s): "POCBNP" CBG: No results for input(s): "GLUCAP" in the last 168 hours. D-Dimer No results for input(s): "DDIMER" in the last 72 hours. Hgb A1c No results for input(s): "HGBA1C" in the last 72 hours. Lipid Profile No results for input(s): "CHOL", "HDL", "LDLCALC", "TRIG", "CHOLHDL", "LDLDIRECT" in the last 72 hours. Thyroid function studies Recent Labs    07/15/21 2216  TSH 2.741   Anemia work up Recent Labs    07/15/21 2216 07/16/21 0449  VITAMINB12  --  214  FOLATE 13.2  --    Urinalysis    Component Value Date/Time   COLORURINE YELLOW 07/15/2021 2023    APPEARANCEUR CLEAR 07/15/2021 2023   APPEARANCEUR Clear 06/11/2021 1458   LABSPEC 1.024 07/15/2021 2023   PHURINE 5.0 07/15/2021 2023   GLUCOSEU 150 (A) 07/15/2021 2023   HGBUR NEGATIVE 07/15/2021 2023   BILIRUBINUR NEGATIVE 07/15/2021 2023   BILIRUBINUR Negative 06/11/2021 1458   KETONESUR 20 (A) 07/15/2021 2023   PROTEINUR NEGATIVE 07/15/2021 2023   UROBILINOGEN 0.2 11/06/2014 2016   NITRITE NEGATIVE 07/15/2021 2023   LEUKOCYTESUR NEGATIVE 07/15/2021 2023   Sepsis Labs Recent Labs  Lab 07/10/21 2348 07/15/21 1546 07/16/21 0449  WBC 6.8 12.0* 8.9  Microbiology No results found for this or any previous visit (from the past 240 hour(s)).   Time coordinating discharge: 64mns  SIGNED:   JKathie Dike MD  [facility] 07/16/2021, 7:54 PM   If 7PM-7AM, please contact night-coverage [email or web address]

## 2021-07-16 NOTE — Progress Notes (Signed)
Pt has discharge orders, discharge teaching given and no further questions at this time. Discharge teaching given with nephew and lady friend bedside. Pt wheeled down to front entrance by [facility] to vehicle accompanied by nephew.

## 2021-07-16 NOTE — Progress Notes (Signed)
3 ticks were removed from pt. Legs during admission skin assessment. 2 on L. Medial thigh and 1 on R. Posterior thigh. MD aware. Prophylactic doxycycline ordered and administered.

## 2021-07-16 NOTE — Evaluation (Signed)
Physical Therapy Evaluation Patient Details Name: Grant Stevens MRN: [id number] DOB: 1940-04-10 Today's Date: 07/16/2021  History of Present Illness  Grant Stevens is a 81 y.o. male with medical history significant of asthma, atrial fibrillation, bladder cancer, BPH, CHF with cardiomyopathy, insomnia, hypertension, hyperlipidemia, CAD, and more presents ED with a chief complaint of fall and confusion.  Patient reports that he [place], when he slid on gravel and fell down an embankment.  He hit the back of his head.  He was confused and crawled on his hands and knees to an area to rest, and fell asleep.  He was apparently there all night.  When he woke up in the morning he was still slightly confused and feeling weak.  He is unclear on who found him to bring him to the ER, but ultimately he was brought to the ER because he was found confused in the park.  Patient reports that prior to his fall he had no chest pain, palpitations, dyspnea, dizziness, or shortness of breath.  He reports that this was a mechanical fall simply slipping on gravel.  Patient believes himself back to his baseline at this time.  He still has subtle memory loss and rambling, not answering questions completely appropriately.  He is oriented x3 at this time.  Patient reports that he is in no pain at this time.  He denies any asymmetric weakness, new dysphagia, drooling, change in vision, change in hearing, change in sensation.  Patient does report a history of dysphagia for which she has had esophageal dilation in the past.  He had dilation done a second time in the past week, but has not had any new dysphagia.  Patient reports only blood thinner he is on is on aspirin.  He has no other complaints at this time.    Clinical Impression  Patient limited for functional mobility as stated below secondary to BLE weakness and impaired standing balance. Patient does not require assist for bed mobility and demonstrates  good sitting balance and tolerance at EOB. He initially transfers to standing without AD but requires assist to power up and HHA upon standing due to impaired balance. Patient utilizes RW for second attempt and requires decreased assist while also showing improved balance. Patient ambulates with use of RW without loss of balance and increases cadence with time. Patient would benefit from use of RW upon returning home to reduce the risk of falls at home and in the community.  Patient will benefit from continued physical therapy in hospital and recommended venue below to increase strength, balance, endurance for safe ADLs and gait.        Recommendations for follow up therapy are one component of a multi-disciplinary discharge planning process, led by the attending physician.  Recommendations may be updated based on patient status, additional functional criteria and insurance authorization.  Follow Up Recommendations Home health PT      Assistance Recommended at Discharge Intermittent Supervision/Assistance  Patient can return home with the following  Assistance with cooking/housework;Assist for transportation;Help with stairs or ramp for entrance    Equipment Recommendations Rolling walker (2 wheels)  Recommendations for Other Services       Functional Status Assessment Patient has had a recent decline in their functional status and demonstrates the ability to make significant improvements in function in a reasonable and predictable amount of time.     Precautions / Restrictions Precautions Precautions: Fall Restrictions Weight Bearing Restrictions: No  Mobility  Bed Mobility Overal bed mobility: Modified Independent             General bed mobility comments: HOB slightly elevated    Transfers Overall transfer level: Needs assistance Equipment used: Rolling walker (2 wheels), 1 person hand held assist Transfers: Sit to/from Stand Sit to Stand: Min guard, Min assist            General transfer comment: requires min A to power up to standing on first attempt with unsteadness upon standing requiring HHA for balance; 2nd attempt with RW requiring decreased assist and demos improved balance    Ambulation/Gait Ambulation/Gait assistance: Min guard Gait Distance (Feet): 200 Feet Assistive device: Rolling walker (2 wheels) Gait Pattern/deviations: Step-through pattern, Decreased stride length Gait velocity: decreased     General Gait Details: cadence increases with ambulation time  Stairs            Wheelchair Mobility    Modified Rankin (Stroke Patients Only)       Balance Overall balance assessment: Needs assistance Sitting-balance support: Feet supported Sitting balance-Leahy Scale: Good Sitting balance - Comments: seated EOB   Standing balance support: No upper extremity supported Standing balance-Leahy Scale: Poor Standing balance comment: poor initially without AD, improves to good with RW                             Pertinent Vitals/Pain Pain Assessment Pain Assessment: No/denies pain    Home Living Family/patient expects to be discharged to:: Private residence Living Arrangements: Alone Available Help at Discharge: Family Type of Home: House Home Access: Stairs to enter Entrance Stairs-Rails: Can reach both Entrance Stairs-Number of Steps: side: 5 steps, porch 2-3 steps   Home Layout: One level Home Equipment: Cane - single point;Other (comment) (walking stick)      Prior Function Prior Level of Function : Independent/Modified Independent             Mobility Comments: states community ambulation ADLs Comments: independent     Hand Dominance        Extremity/Trunk Assessment   Upper Extremity Assessment Upper Extremity Assessment: Overall WFL for tasks assessed    Lower Extremity Assessment Lower Extremity Assessment: Generalized weakness    Cervical / Trunk Assessment Cervical /  Trunk Assessment: Kyphotic  Communication   Communication: No difficulties  Cognition Arousal/Alertness: Awake/alert Behavior During Therapy: WFL for tasks assessed/performed Overall Cognitive Status: Within Functional Limits for tasks assessed                                          General Comments      Exercises     Assessment/Plan    PT Assessment Patient needs continued PT services  PT Problem List Decreased strength;Decreased mobility;Decreased activity tolerance;Decreased balance;Decreased knowledge of use of DME       PT Treatment Interventions DME instruction;Therapeutic activities;Gait training;Therapeutic exercise;Patient/family education;Stair training;Balance training;Functional mobility training;Neuromuscular re-education    PT Goals (Current goals can be found in the Care Plan section)  Acute Rehab PT Goals Patient Stated Goal: return home PT Goal Formulation: With patient Time For Goal Achievement: 07/23/21 Potential to Achieve Goals: Good    Frequency Min 3X/week     Co-evaluation               AM-PAC PT "6 Clicks" Mobility  Outcome Measure Help  needed turning from your back to your side while in a flat bed without using bedrails?: None Help needed moving from lying on your back to sitting on the side of a flat bed without using bedrails?: None Help needed moving to and from a bed to a chair (including a wheelchair)?: A Little Help needed standing up from a chair using your arms (e.g., wheelchair or bedside chair)?: A Little Help needed to walk in hospital room?: A Little Help needed climbing 3-5 steps with a railing? : A Lot 6 Click Score: 19    End of Session Equipment Utilized During Treatment: Gait belt Activity Tolerance: Patient tolerated treatment well Patient left: in bed;with call bell/phone within reach;with bed alarm set Nurse Communication: Mobility status PT Visit Diagnosis: Unsteadiness on feet (R26.81);Other  abnormalities of gait and mobility (R26.89);Muscle weakness (generalized) (M62.81)    Time: 1610-9604 PT Time Calculation (min) (ACUTE ONLY): 20 min   Charges:   PT Evaluation $PT Eval Low Complexity: 1 Low PT Treatments $Therapeutic Activity: 8-22 mins        11:12 AM, 07/16/21 Mearl Latin PT, DPT Physical Therapist at [facility]

## 2021-07-16 NOTE — TOC Progression Note (Signed)
Transition of Care [facility]) - Progression Note    Patient Details  Name: Grant Stevens MRN: [id number] Date of Birth: 23-Jul-1940  Transition of Care [facility]) CM/SW Contact  Boneta Lucks, RN Phone Number: 07/16/2021, 3:14 PM  Clinical Narrative:   Patient admitted with acute metabolic encephalopathy. PT is recommending HHPT, Marjory Lies with [place] accepted. MD aware to order. Rolling walker delivered by [employer]. Fara Olden accepted the referral. DC planning for tomorrow.    Expected Discharge Plan: [place] Barriers to Discharge: Continued Medical Work up  Expected Discharge Plan and Services Expected Discharge Plan: [place], Durable Medical Equipment Living arrangements for the past 2 months: [place]: PT [place]: [place] Date [place]: 07/16/21 Time [place]: 1300 Representative spoke with at [place]: Marjory Lies   Readmission Risk Interventions     No data to display

## 2021-07-16 NOTE — Plan of Care (Signed)
  Problem: Acute Rehab PT Goals(only PT should resolve) Goal: Patient Will Transfer Sit To/From Stand Outcome: Progressing Flowsheets (Taken 07/16/2021 1114) Patient will transfer sit to/from stand: with modified independence Goal: Pt Will Transfer Bed To Chair/Chair To Bed Outcome: Progressing Flowsheets (Taken 07/16/2021 1114) Pt will Transfer Bed to Chair/Chair to Bed: with modified independence Goal: Pt Will Ambulate Outcome: Progressing Flowsheets (Taken 07/16/2021 1114) Pt will Ambulate:  > 125 feet  with modified independence  with least restrictive assistive device Goal: Pt/caregiver will Perform Home Exercise Program Outcome: Progressing Flowsheets (Taken 07/16/2021 1114) Pt/caregiver will Perform Home Exercise Program:  For increased strengthening  For improved balance  Independently  11:14 AM, 07/16/21 Mearl Latin PT, DPT Physical Therapist at [facility]

## 2021-07-16 NOTE — Progress Notes (Signed)
[facility] PROGRESS NOTE  Grant Stevens IFO:[id number] DOB: [birth year] DOA: 07/15/2021 PCP: [name], MD  Status is: Observation The patient remains OBS appropriate and will d/c before 2 midnights.  Level of care:  Med-Surg   Code Status: Full Family Communication: Patient only-lives alone but does have multiple family members and friends who can assist with his care after discharge DVT prophylaxis: Subcutaneous heparin COVID vaccination status: According to H&P he is vaccinated for COVID.  He also had COVID-19 infection in July 2022  HPI:  81 y.o. male with medical history significant of asthma, atrial fibrillation, bladder cancer, BPH, CHF with cardiomyopathy, insomnia, hypertension, hyperlipidemia, CAD, and more presents ED with a chief complaint of fall and confusion.  Patient reports that [place], when he slid on gravel and fell down an embankment.  He hit the back of his head.  He was confused and crawled on his hands and knees to an area to rest, and fell asleep.  He was apparently there all night.  When he woke up in the morning he was still slightly confused and feeling weak.  He is unclear on who found him to bring him to the ER, but ultimately he was brought to the ER because he was found confused in the park.  Patient reports that prior to his fall he had no chest pain, palpitations, dyspnea, dizziness, or shortness of breath.  He reports that this was a mechanical fall simply slipping on gravel.  Patient believes himself back to his baseline at this time.  He still has subtle memory loss and rambling, not answering questions completely appropriately.  He is oriented x3 at this time.  Patient reports that he is in no pain at this time.  He denies any asymmetric weakness, new dysphagia, drooling, change in vision, change in hearing, change in sensation.  Patient does report a history of dysphagia for which she has had esophageal dilation in the past.   He had dilation done a second time in the past week, but has not had any new dysphagia.  Patient reports only blood thinner he is on is on aspirin.  He has no other complaints at this time.  In addition patient has recently been on Cipro after having skin warts removed    Subjective: Awake and alert denies any pain.  Tells me that prior to having his fall he had significant pain in his right knee to the point he could not bear weight and was considering surgery even though it was high risk.  After his fall he demonstrates that he is able to move his right knee without any restrictions and without pain.  Objective: Vitals:   07/15/21 2159 07/16/21 0409  BP: (!) 168/84 120/66  Pulse: 86 63  Resp: 14 15  Temp: 98.2 F (36.8 C) 98.4 F (36.9 C)  SpO2: 96% 97%    Intake/Output Summary (Last 24 hours) at 07/16/2021 1328 Last data filed at 07/16/2021 0516 Gross per 24 hour  Intake 240 ml  Output --  Net 240 ml   Filed Weights   07/15/21 2203  Weight: 96.9 kg    Exam:  Constitutional: NAD, calm, comfortable Respiratory: clear to auscultation bilaterally, no wheezing, no crackles. Normal respiratory effort. No accessory muscle use.  Cardiovascular: Regular rate and rhythm, no murmurs / rubs / gallops. No extremity edema. 2+ pedal pulses. Abdomen: no tenderness, no masses palpated. Bowel sounds positive.  Musculoskeletal: no clubbing / cyanosis. No joint  deformity upper and lower extremities. Good ROM, no contractures. Normal muscle tone.  Neurologic: CN 2-12 grossly intact. Sensation intact, DTR normal. Strength 5/5 x all 4 extremities.  Psychiatric: Normal judgment and insight. Alert and oriented x 3. Normal mood.    Assessment/Plan: Acute problems: Dehydration with elevated CK Stable creatinine Continue fluids at 75/h CK has decreased from 772-460-continue to follow Was on Jardiance as well as Lasix prior to admission which are currently on hold  Other problems: Acute metabolic  encephalopathy Markedly improved after hydration  Mild hypokalemia Replace as needed Follow labs  Abnormal urinalysis Afebrile so no indication for empiric antibiotic Follow-up on urine culture  Asthma Currently stable without any wheezing Add DuoNebs as needed  Tick bite exposure Continue empiric Vibramycin x2 weeks Currently afebrile and no symptoms No tick bite studies obtained in the ER or at time of admission Continue to monitor  CM/HFrEF Continue beta-blocker and Entresto in regards to GDMT none are on his home medication list Echocardiogram 2022 with EF 25 to 30% with global hypokinesis and moderate LVH.  Normal RV systolic function.  Mild MR.  Mild AR  History of atrial fibrillation Apparent remote history of atrial fibrillation/sick sinus syndrome with underlying pacemaker Currently with atrial sensed V pacing with occasional PVCs as well as prolonged PR interval Was not on full dose anticoagulation prior to admission  Hypertension Continue Entresto and bisoprolol  Dyslipidemia Continue statin  CAD Medications as above  Bipolar disorder Continue Tegretol and Depakote  BPH Continue Flomax and Rapaflo    Data Reviewed: Basic Metabolic Panel: Recent Labs  Lab 07/10/21 2348 07/15/21 1732 07/16/21 0449  NA 136 134* 136  K 4.3 4.1 3.3*  CL 103 103 109  CO2 24 23 21*  GLUCOSE 136* 87 72  BUN 36* 24* 17  CREATININE 1.46* 1.07 0.78  CALCIUM 8.1* 8.5* 7.2*  MG 2.0  --  1.8   Liver Function Tests: Recent Labs  Lab 07/10/21 2348 07/15/21 1732 07/16/21 0449  AST 22 39 29  ALT '19 26 20  '$ ALKPHOS 65 76 57  BILITOT 0.4 0.9 0.6  PROT 6.3* 6.7 5.2*  ALBUMIN 3.5 3.8 2.8*   No results for input(s): "LIPASE", "AMYLASE" in the last 168 hours. Recent Labs  Lab 07/15/21 2216  AMMONIA 24   CBC: Recent Labs  Lab 07/10/21 2348 07/15/21 1546 07/16/21 0449  WBC 6.8 12.0* 8.9  NEUTROABS  --   --  7.7  HGB 12.0* 12.9* 10.8*  HCT 35.1* 39.3 33.7*   MCV 94.9 93.6 96.0  PLT 133* 129* 107*   Cardiac Enzymes: Recent Labs  Lab 07/15/21 1732 07/16/21 0843  CKTOTAL 772* 460*   BNP (last 3 results) No results for input(s): "BNP" in the last 8760 hours.  ProBNP (last 3 results) No results for input(s): "PROBNP" in the last 8760 hours.  CBG: No results for input(s): "GLUCAP" in the last 168 hours.  No results found for this or any previous visit (from the past 240 hour(s)).   Studies: CT T-SPINE NO CHARGE  Result Date: 07/15/2021 CLINICAL DATA:  Trauma.  Possible fall. EXAM: CT THORACIC SPINE WITH CONTRAST TECHNIQUE: Multiplanar CT images of the thoracic spine were reconstructed from contemporary CT of the Chest. RADIATION DOSE REDUCTION: This exam was performed according to the departmental dose-optimization program which includes automated exposure control, adjustment of the mA and/or kV according to patient size and/or use of iterative reconstruction technique. CONTRAST:  No additional COMPARISON:  CT abdomen and pelvis  02/11/2020. FINDINGS: Alignment: Exaggerated thoracic kyphosis. Upper thoracic dextroscoliosis. No significant listhesis. Vertebrae: Diffuse osteopenia. Mild chronic anterior wedging of the T10-T12 vertebral bodies, unchanged. Schmorl's nodes at T7, T8, and T9. No acute fracture or suspicious osseous lesion is identified. Paraspinal and other soft tissues: No acute abnormality identified in the paraspinal soft tissues. Intrathoracic contents reported separately. Disc levels: Spondylosis with bridging anterior vertebral osteophytes in the lower thoracic spine. IMPRESSION: No acute thoracic spine fracture. Electronically Signed   By: Logan Bores M.D.   On: 07/15/2021 19:31   CT CHEST ABDOMEN PELVIS W CONTRAST  Result Date: 07/15/2021 CLINICAL DATA:  Confusion, possible fall, trauma EXAM: CT CHEST, ABDOMEN, AND PELVIS WITH CONTRAST TECHNIQUE: Multidetector CT imaging of the chest, abdomen and pelvis was performed following  the standard protocol during bolus administration of intravenous contrast. RADIATION DOSE REDUCTION: This exam was performed according to the departmental dose-optimization program which includes automated exposure control, adjustment of the mA and/or kV according to patient size and/or use of iterative reconstruction technique. CONTRAST:  110m OMNIPAQUE IOHEXOL 300 MG/ML  SOLN COMPARISON:  02/11/2020 FINDINGS: CT CHEST FINDINGS Cardiovascular: There is prominent left ventricular dilatation without frank cardiomegaly. No pericardial effusion. Calcification of the mitral valve. Postsurgical changes from coronary artery bypass. Dual lead pacer identified. No evidence of thoracic aortic aneurysm or dissection. Atherosclerosis of the aortic arch and native coronary vessels. Mediastinum/Nodes: No enlarged mediastinal, hilar, or axillary lymph nodes. Thyroid gland, trachea, and esophagus demonstrate no significant findings. Small hiatal hernia. Lungs/Pleura: No acute airspace disease, effusion, or pneumothorax. Stable 4 mm left lower lobe pulmonary nodule reference image 34/504. Central airways are patent. Musculoskeletal: No acute or destructive bony lesions. Reconstructed images demonstrate no additional findings. CT ABDOMEN PELVIS FINDINGS Hepatobiliary: No focal liver abnormality is seen. No gallstones, gallbladder wall thickening, or biliary dilatation. Pancreas: Unremarkable. No pancreatic ductal dilatation or surrounding inflammatory changes. Spleen: Normal in size without focal abnormality. Adrenals/Urinary Tract: Bilateral renal cortical thinning. Otherwise the kidneys enhance normally and symmetrically. No urinary tract calculi or obstructive uropathy. The adrenals are unremarkable. Bladder is moderately distended without filling defect. Stomach/Bowel: No bowel obstruction or ileus. Normal appendix right lower quadrant. No bowel wall thickening or inflammatory change. Vascular/Lymphatic: Aortic atherosclerosis.  No enlarged abdominal or pelvic lymph nodes. Reproductive: Prostate is unremarkable. Other: No free fluid or free intraperitoneal gas. No abdominal wall hernia. Musculoskeletal: No acute or destructive bony lesions. IMPRESSION: 1. No acute intrathoracic, intra-abdominal, or intrapelvic trauma. 2. Small hiatal hernia. 3. Aortic Atherosclerosis (ICD10-I70.0). Coronary artery atherosclerosis. 4. Stable 4 mm left lower lobe pulmonary nodule, benign given size and long-term stability. No further imaging workup is required. Electronically Signed   By: MRanda NgoM.D.   On: 07/15/2021 19:26   CT Cervical Spine Wo Contrast  Result Date: 07/15/2021 CLINICAL DATA:  Neck trauma.  Confusion.  Possible fall. EXAM: CT CERVICAL SPINE WITHOUT CONTRAST TECHNIQUE: Multidetector CT imaging of the cervical spine was performed without intravenous contrast. Multiplanar CT image reconstructions were also generated. RADIATION DOSE REDUCTION: This exam was performed according to the departmental dose-optimization program which includes automated exposure control, adjustment of the mA and/or kV according to patient size and/or use of iterative reconstruction technique. COMPARISON:  CTA head and neck 10/19/2014 FINDINGS: Alignment: Left convex curvature of the cervical spine. No significant listhesis. Skull base and vertebrae: No acute fracture or suspicious osseous lesion. Soft tissues and spinal canal: No prevertebral fluid or swelling. No visible canal hematoma. Disc levels: Mild-to-moderate cervical disc and facet  degeneration. No high-grade spinal canal stenosis or high-grade osseous neural foraminal stenosis. Upper chest: Reported separately. Other: Prominent calcific atherosclerosis at the carotid bifurcations. IMPRESSION: No acute cervical spine fracture or traumatic subluxation. Electronically Signed   By: Logan Bores M.D.   On: 07/15/2021 19:24   CT Head Wo Contrast  Result Date: 07/15/2021 CLINICAL DATA:  Head trauma.   Confusion. EXAM: CT HEAD WITHOUT CONTRAST TECHNIQUE: Contiguous axial images were obtained from the base of the skull through the vertex without intravenous contrast. RADIATION DOSE REDUCTION: This exam was performed according to the departmental dose-optimization program which includes automated exposure control, adjustment of the mA and/or kV according to patient size and/or use of iterative reconstruction technique. COMPARISON:  CT head 10/19/2014 FINDINGS: Brain: Ventricle size and cerebral volume normal. Negative for acute infarct, hemorrhage, mass. Vascular: Negative for hyperdense vessel Skull: Negative Sinuses/Orbits: Retention cyst left maxillary sinus. Remaining sinuses clear. Bilateral cataract extraction Other: None IMPRESSION: Negative CT head Electronically Signed   By: Franchot Gallo M.D.   On: 07/15/2021 19:22   DG Chest Portable 1 View  Result Date: 07/15/2021 CLINICAL DATA:  Trauma.  Fall.  Confusion. EXAM: PORTABLE CHEST 1 VIEW COMPARISON:  01/15/2021 FINDINGS: Left-sided pacemaker in place. Patient is post median sternotomy. Overall low lung volumes. Stable cardiomegaly. Unchanged mediastinal contours. Vague ill-defined opacities in the right lung base and right suprahilar region. No pulmonary edema, large pleural effusion or pneumothorax. On limited assessment, no acute osseous abnormalities are seen. IMPRESSION: 1. Vague ill-defined opacities in the right lung base and right suprahilar region, may be atelectasis or pneumonia. 2. Stable cardiomegaly. Electronically Signed   By: Keith Rake M.D.   On: 07/15/2021 17:33    Scheduled Meds:  aspirin EC  81 mg Oral Daily   atorvastatin  80 mg Oral QHS   bisoprolol  2.5 mg Oral Daily   carbamazepine  400 mg Oral BID   divalproex  250 mg Oral Daily   famotidine  40 mg Oral QHS   heparin  5,000 Units Subcutaneous Q8H   multivitamin with minerals  1 tablet Oral Daily   pantoprazole  40 mg Oral QAC breakfast   sacubitril-valsartan  1  tablet Oral BID   tamsulosin  0.4 mg Oral QPC supper   thiamine  100 mg Oral Daily   Continuous Infusions:  sodium chloride 75 mL/hr at 07/16/21 1101    Principal Problem:   Acute metabolic encephalopathy Active Problems:   Mixed hyperlipidemia   Essential hypertension, benign   CAD (coronary artery disease) of artery bypass graft   GERD (gastroesophageal reflux disease)   Dehydration   Fall at home, initial encounter   Tick bite   Consultants: None  Procedures: None  Antibiotics: Vibramycin 7/6 through the present   Time spent: 35 minutes    Erin Hearing ANP  [facility] 7 am - 330 pm/M-F for direct patient care and secure chat Please refer to Amion for contact info 0  days

## 2021-07-17 LAB — URINE CULTURE: Culture: 30000 — AB

## 2021-07-19 LAB — VITAMIN B1: Vitamin B1 (Thiamine): 177.2 nmol/L (ref 66.5–200.0)

## 2021-07-20 DIAGNOSIS — K219 Gastro-esophageal reflux disease without esophagitis: Secondary | ICD-10-CM | POA: Diagnosis not present

## 2021-07-20 DIAGNOSIS — E782 Mixed hyperlipidemia: Secondary | ICD-10-CM | POA: Diagnosis not present

## 2021-07-20 DIAGNOSIS — K589 Irritable bowel syndrome without diarrhea: Secondary | ICD-10-CM | POA: Diagnosis not present

## 2021-07-20 DIAGNOSIS — Z8551 Personal history of malignant neoplasm of bladder: Secondary | ICD-10-CM | POA: Diagnosis not present

## 2021-07-20 DIAGNOSIS — Z950 Presence of cardiac pacemaker: Secondary | ICD-10-CM | POA: Diagnosis not present

## 2021-07-20 DIAGNOSIS — I2510 Atherosclerotic heart disease of native coronary artery without angina pectoris: Secondary | ICD-10-CM | POA: Diagnosis not present

## 2021-07-20 DIAGNOSIS — Z951 Presence of aortocoronary bypass graft: Secondary | ICD-10-CM | POA: Diagnosis not present

## 2021-07-20 DIAGNOSIS — I110 Hypertensive heart disease with heart failure: Secondary | ICD-10-CM | POA: Diagnosis not present

## 2021-07-20 DIAGNOSIS — Z87891 Personal history of nicotine dependence: Secondary | ICD-10-CM | POA: Diagnosis not present

## 2021-07-20 DIAGNOSIS — E860 Dehydration: Secondary | ICD-10-CM | POA: Diagnosis not present

## 2021-07-20 DIAGNOSIS — K529 Noninfective gastroenteritis and colitis, unspecified: Secondary | ICD-10-CM | POA: Diagnosis not present

## 2021-07-20 DIAGNOSIS — N400 Enlarged prostate without lower urinary tract symptoms: Secondary | ICD-10-CM | POA: Diagnosis not present

## 2021-07-20 DIAGNOSIS — Z7982 Long term (current) use of aspirin: Secondary | ICD-10-CM | POA: Diagnosis not present

## 2021-07-20 DIAGNOSIS — F319 Bipolar disorder, unspecified: Secondary | ICD-10-CM | POA: Diagnosis not present

## 2021-07-20 DIAGNOSIS — E538 Deficiency of other specified B group vitamins: Secondary | ICD-10-CM | POA: Diagnosis not present

## 2021-07-20 DIAGNOSIS — Z7984 Long term (current) use of oral hypoglycemic drugs: Secondary | ICD-10-CM | POA: Diagnosis not present

## 2021-07-20 DIAGNOSIS — I252 Old myocardial infarction: Secondary | ICD-10-CM | POA: Diagnosis not present

## 2021-07-20 DIAGNOSIS — J45909 Unspecified asthma, uncomplicated: Secondary | ICD-10-CM | POA: Diagnosis not present

## 2021-07-20 DIAGNOSIS — G9341 Metabolic encephalopathy: Secondary | ICD-10-CM | POA: Diagnosis not present

## 2021-07-20 DIAGNOSIS — I429 Cardiomyopathy, unspecified: Secondary | ICD-10-CM | POA: Diagnosis not present

## 2021-07-20 DIAGNOSIS — I5022 Chronic systolic (congestive) heart failure: Secondary | ICD-10-CM | POA: Diagnosis not present

## 2021-07-20 DIAGNOSIS — G4700 Insomnia, unspecified: Secondary | ICD-10-CM | POA: Diagnosis not present

## 2021-07-20 DIAGNOSIS — I4891 Unspecified atrial fibrillation: Secondary | ICD-10-CM | POA: Diagnosis not present

## 2021-07-20 DIAGNOSIS — N529 Male erectile dysfunction, unspecified: Secondary | ICD-10-CM | POA: Diagnosis not present

## 2021-07-26 DIAGNOSIS — B078 Other viral warts: Secondary | ICD-10-CM | POA: Diagnosis not present

## 2021-07-26 DIAGNOSIS — L570 Actinic keratosis: Secondary | ICD-10-CM | POA: Diagnosis not present

## 2021-07-26 DIAGNOSIS — D225 Melanocytic nevi of trunk: Secondary | ICD-10-CM | POA: Diagnosis not present

## 2021-07-26 DIAGNOSIS — X32XXXD Exposure to sunlight, subsequent encounter: Secondary | ICD-10-CM | POA: Diagnosis not present

## 2021-07-28 DIAGNOSIS — G9341 Metabolic encephalopathy: Secondary | ICD-10-CM | POA: Diagnosis not present

## 2021-07-28 DIAGNOSIS — E860 Dehydration: Secondary | ICD-10-CM | POA: Diagnosis not present

## 2021-07-28 DIAGNOSIS — R609 Edema, unspecified: Secondary | ICD-10-CM | POA: Diagnosis not present

## 2021-07-28 DIAGNOSIS — D519 Vitamin B12 deficiency anemia, unspecified: Secondary | ICD-10-CM | POA: Diagnosis not present

## 2021-08-02 DIAGNOSIS — F319 Bipolar disorder, unspecified: Secondary | ICD-10-CM | POA: Diagnosis not present

## 2021-08-02 DIAGNOSIS — Z79899 Other long term (current) drug therapy: Secondary | ICD-10-CM | POA: Diagnosis not present

## 2021-08-02 DIAGNOSIS — I5022 Chronic systolic (congestive) heart failure: Secondary | ICD-10-CM | POA: Diagnosis not present

## 2021-08-02 DIAGNOSIS — E871 Hypo-osmolality and hyponatremia: Secondary | ICD-10-CM | POA: Diagnosis not present

## 2021-08-09 ENCOUNTER — Other Ambulatory Visit (INDEPENDENT_AMBULATORY_CARE_PROVIDER_SITE_OTHER): Payer: Self-pay | Admitting: Gastroenterology

## 2021-08-11 ENCOUNTER — Ambulatory Visit: Payer: PPO | Admitting: Student

## 2021-08-11 ENCOUNTER — Ambulatory Visit (INDEPENDENT_AMBULATORY_CARE_PROVIDER_SITE_OTHER): Payer: PPO

## 2021-08-11 ENCOUNTER — Encounter: Payer: Self-pay | Admitting: Student

## 2021-08-11 VITALS — BP 122/76 | HR 76 | Ht 72.0 in | Wt 211.4 lb

## 2021-08-11 DIAGNOSIS — I10 Essential (primary) hypertension: Secondary | ICD-10-CM | POA: Diagnosis not present

## 2021-08-11 DIAGNOSIS — I255 Ischemic cardiomyopathy: Secondary | ICD-10-CM

## 2021-08-11 DIAGNOSIS — I2510 Atherosclerotic heart disease of native coronary artery without angina pectoris: Secondary | ICD-10-CM

## 2021-08-11 DIAGNOSIS — E871 Hypo-osmolality and hyponatremia: Secondary | ICD-10-CM | POA: Diagnosis not present

## 2021-08-11 DIAGNOSIS — I442 Atrioventricular block, complete: Secondary | ICD-10-CM | POA: Diagnosis not present

## 2021-08-11 DIAGNOSIS — I5020 Unspecified systolic (congestive) heart failure: Secondary | ICD-10-CM | POA: Diagnosis not present

## 2021-08-11 DIAGNOSIS — D696 Thrombocytopenia, unspecified: Secondary | ICD-10-CM | POA: Diagnosis not present

## 2021-08-11 DIAGNOSIS — E785 Hyperlipidemia, unspecified: Secondary | ICD-10-CM

## 2021-08-11 DIAGNOSIS — I5022 Chronic systolic (congestive) heart failure: Secondary | ICD-10-CM | POA: Diagnosis not present

## 2021-08-11 LAB — CUP PACEART REMOTE DEVICE CHECK
Battery Remaining Longevity: 86 mo
Battery Remaining Percentage: 89 %
Battery Voltage: 3.01 V
Brady Statistic AP VP Percent: 52 %
Brady Statistic AP VS Percent: 1 %
Brady Statistic AS VP Percent: 47 %
Brady Statistic AS VS Percent: 1 %
Brady Statistic RA Percent Paced: 53 %
Brady Statistic RV Percent Paced: 99 %
Date Time Interrogation Session: 20230802060614
Implantable Lead Implant Date: 20220503
Implantable Lead Implant Date: 20220503
Implantable Lead Location: 753859
Implantable Lead Location: 753860
Implantable Pulse Generator Implant Date: 20220503
Lead Channel Impedance Value: 360 Ohm
Lead Channel Impedance Value: 410 Ohm
Lead Channel Pacing Threshold Amplitude: 0.75 V
Lead Channel Pacing Threshold Amplitude: 0.75 V
Lead Channel Pacing Threshold Pulse Width: 0.5 ms
Lead Channel Pacing Threshold Pulse Width: 0.5 ms
Lead Channel Sensing Intrinsic Amplitude: 2.3 mV
Lead Channel Sensing Intrinsic Amplitude: 5.8 mV
Lead Channel Setting Pacing Amplitude: 2 V
Lead Channel Setting Pacing Amplitude: 2.5 V
Lead Channel Setting Pacing Pulse Width: 0.5 ms
Lead Channel Setting Sensing Sensitivity: 2 mV
Pulse Gen Model: 2272
Pulse Gen Serial Number: 3920512

## 2021-08-11 NOTE — Progress Notes (Signed)
Cardiology Office Note    Date:  08/11/2021   ID:  Grant Stevens, DOB [birth year], MRN [id number]  PCP:  [name], MD  Cardiologist: Rozann Lesches, MD    Chief Complaint  Patient presents with   Follow-up    3 month visit    History of Present Illness:    Grant Stevens is a 81 y.o. male with past medical history of CAD (s/p CABG in 2002, no ischemia by NST in 11/2013), HFrEF/ICM (EF 30-35% by echo in 2018 and 10/2018, at 25-30% in 05/2020), SSS (s/p St. Jude Dual PPM in 05/2020), HTN and HLD who presents to the office today for 32-monthfollow-up.   He was examined by myself in 04/2021 and reported worsening dyspnea in the setting of a 10 lb weight gain and had been consuming a high-sodium diet. His PCP had temporarily titrated his Lasix to '40mg'$  BID and I recommended he continue this for at least an additional 5+ days then resume '40mg'$  daily alternating with '20mg'$  daily. Was continued on Bisoprolol, Jardiance and Entresto but had previously been intolerant to Spironolactone due to hyperkalemia.   In the interim, [facility] and was found to have an AKI. His diarrhea resolved and AKI improved with administration of IV fluids. He was again admitted in 07/2021 for evaluation of AMS after having a fall while walking on a local trail. He had apparently spent the night outside and was found down the following morning. His device was interrogated and showed no significant arrhythmias. CT Head showed no acute findings. He was dehydrated on admission and received IV fluids and mental status was back to baseline the following morning. No changes were made to his medical therapy at the time of discharge.   In talking with the patient today, he reports overall feeling well since his recent hospitalization. Says that he feels like his energy level has significantly improved over the past few months. He denies any recent chest pain or dyspnea  on exertion. No recent orthopnea, PND or palpitations. His lower extremity edema has been well-controlled and he is now taking Lasix 40 mg daily at the instruction of his PCP. He did have follow-up labs after his hospitalization and we will request a copy of these.   Past Medical History:  Diagnosis Date   Allergic rhinitis    Arthritis    Asthma    Childhood   Atrial fibrillation ([facility]    Remote history - WARCEF   Bladder cancer (HCC)    BPH (benign prostatic hyperplasia)    Cardiomyopathy (HCC)    CHF (congestive heart failure) ([facility]    a. EF 30-35% by echo in 2018 and 10/2018, at 25-30% in 05/2020   Coronary atherosclerosis of native coronary artery    Multivessel s/p CABG in 2002 post-IMI, LVEF 35% up to 50% postoperatively;   Depression    Difficulty sleeping    Erectile dysfunction    Essential hypertension    Frequency of urination    History of bipolar disorder    Hyperlipidemia    IBS (irritable bowel syndrome)    [place]    Past Surgical History:  Procedure Laterality Date   BIOPSY  03/28/2018   Procedure: BIOPSY;  Surgeon: RRogene Houston MD;  [facility];  Service: Endoscopy;;  esophagus   BIOPSY  03/09/2021   Procedure: BIOPSY;  Surgeon: CHarvel Quale MD;  [facility]  [facility];  Service: Gastroenterology;;   BIV PACEMAKER INSERTION CRT-P N/A 05/12/2020   Procedure: BIV PACEMAKER INSERTION CRT-P;  Surgeon: Thompson Grayer, MD;  Location: [place] [facility];  Service: Cardiovascular;  Laterality: N/A;   CATARACT EXTRACTION W/PHACO Left 12/29/2014   Procedure: CATARACT EXTRACTION PHACO AND INTRAOCULAR LENS PLACEMENT (IOC);  Surgeon: Williams Che, MD;  Location: [facility];  Service: Ophthalmology;  Laterality: Left;  CDE:3.71   CATARACT EXTRACTION W/PHACO Right 03/30/2015   Procedure: CATARACT EXTRACTION PHACO AND INTRAOCULAR LENS PLACEMENT RIGHT EYE CDE=2.56;  Surgeon: Williams Che, MD;  Location: [facility];   Service: Ophthalmology;  Laterality: Right;   CIRCUMCISION  10/11/2003   COLONOSCOPY N/A 07/11/2012   rehman,transverse colon polyp (benign lymphoid); tortuous colon; prep adequate with much suction/lavage; hemorrhoids.   CORONARY ARTERY BYPASS GRAFT  08/10/2000   Dr. Servando Snare - LIMA to LAD, SVG to diagonal, SVG to PDA TRIPLE BYPASS   CYSTOSCOPY W/ RETROGRADES Bilateral 03/02/2015   Procedure: CYSTOSCOPY WITH RIGHT RETROGRADE PYELOGRAM,  ATTEMPTED LEFT RETROGRADE PYELOGRAM;  Surgeon: Cleon Gustin, MD;  [facility];  Service: Urology;  Laterality: Bilateral;   ESOPHAGEAL DILATION N/A 03/28/2018   Procedure: ESOPHAGEAL DILATION;  Surgeon: Rogene Houston, MD;  [facility];  Service: Endoscopy;  Laterality: N/A;   ESOPHAGOGASTRODUODENOSCOPY N/A 03/28/2018   rehman,Abnormal esophageal motility. mild schatzki ring at GEJ, dilated, patch of salmon colored mucosa at distal esophagus. Barrett's esophagus. 2cm HH. erosive gastropathy, normal pylorus, duodenal erosions w/o bleeding. normal second portion of duodenum   ESOPHAGOGASTRODUODENOSCOPY (EGD) WITH PROPOFOL N/A 03/09/2021   Procedure: ESOPHAGOGASTRODUODENOSCOPY (EGD) WITH PROPOFOL;  Surgeon: Harvel Quale, MD;  [facility];  Service: Gastroenterology;  Laterality: N/A;  945   TEMPORARY PACEMAKER N/A 05/12/2020   Procedure: TEMPORARY PACEMAKER;  Surgeon: Martinique, Peter M, MD;  Location: [place] [facility];  Service: Cardiovascular;  Laterality: N/A;   TONSILLECTOMY     TRANSURETHRAL RESECTION OF BLADDER TUMOR N/A 01/26/2015   Procedure: TRANSURETHRAL RESECTION OF BLADDER TUMOR (TURBT);  Surgeon: Cleon Gustin, MD;  [facility];  Service: Urology;  Laterality: N/A;   TRANSURETHRAL RESECTION OF BLADDER TUMOR WITH GYRUS (TURBT-GYRUS) N/A 03/02/2015   Procedure: TRANSURETHRAL RESECTION OF BLADDER TUMOR WITH GYRUS (TURBT-GYRUS);  Surgeon: Cleon Gustin, MD;  [facility];  Service: Urology;   Laterality: N/A;   TRANSURETHRAL RESECTION OF PROSTATE N/A 01/26/2015   Procedure: TRANSURETHRAL RESECTION OF THE PROSTATE WITH GYRUS INSTRUMENTS;  Surgeon: Cleon Gustin, MD;  [facility];  Service: Urology;  Laterality: N/A;    Current Medications: Outpatient Medications Prior to Visit  Medication Sig Dispense Refill   aspirin EC 81 MG tablet Take 1 tablet (81 mg total) by mouth daily.     atorvastatin (LIPITOR) 80 MG tablet Take 80 mg by mouth at bedtime.       bisoprolol (ZEBETA) 5 MG tablet Take 0.5 tablets (2.5 mg total) by mouth daily. 45 tablet 3   carbamazepine (TEGRETOL) 200 MG tablet Take 400 mg by mouth 2 (two) times daily.      divalproex (DEPAKOTE ER) 250 MG 24 hr tablet Take 250 mg by mouth daily.     empagliflozin (JARDIANCE) 10 MG TABS tablet Take 1 tablet (10 mg total) by mouth daily before breakfast. 30 tablet 11   famotidine (PEPCID) 40 MG tablet TAKE (1) TABLET BY MOUTH AT BEDTIME. 90 tablet 0   furosemide (LASIX) 40 MG tablet Take 0.5-1 tablets (20-40 mg  total) by mouth daily. '20mg'$  one day then '40mg'$  the other day alternating '20mg'$ -'40mg'$  (Patient taking differently: Take 40 mg by mouth daily. Take one tablet daily)     loperamide (IMODIUM) 2 MG capsule Take 1 capsule (2 mg total) by mouth every 6 (six) hours as needed for diarrhea or loose stools. 30 capsule 0   LORazepam (ATIVAN) 0.5 MG tablet Take 0.5 mg by mouth at bedtime.     pantoprazole (PROTONIX) 40 MG tablet Take 1 tablet (40 mg total) by mouth daily before breakfast. 30 tablet 2   sacubitril-valsartan (ENTRESTO) 24-26 MG Take 1 tablet by mouth 2 (two) times daily. 60 tablet 11   silodosin (RAPAFLO) 8 MG CAPS capsule Take 1 capsule (8 mg total) by mouth daily with breakfast. 30 capsule 11   tamsulosin (FLOMAX) 0.4 MG CAPS capsule Take 1 capsule (0.4 mg total) by mouth at bedtime. 90 capsule 3   temazepam (RESTORIL) 15 MG capsule Take 15 mg by mouth at bedtime as needed for sleep.     vitamin B-12  (CYANOCOBALAMIN) 1000 MCG tablet Take 1 tablet (1,000 mcg total) by mouth daily. 30 tablet 0   No facility-administered medications prior to visit.     Allergies:   Spironolactone   Social History   Socioeconomic History   Marital status: Widowed    Spouse name: Not on file   Number of children: Not on file   Years of education: Not on file   Highest education level: Not on file  Occupational History   Not on file  Tobacco Use   Smoking status: Former    Packs/day: 1.00    Years: 15.00    Total pack years: 15.00    Types: Cigarettes    Quit date: 01/11/1971    Years since quitting: 50.6    Passive exposure: Past   Smokeless tobacco: Never  Vaping Use   Vaping Use: Never used  Substance and Sexual Activity   Alcohol use: Not Currently    Comment: occ.   Drug use: No   Sexual activity: Not Currently  Other Topics Concern   Not on file  Social History Narrative   Married with no children    Exercises 4 times weekly   Caffeine once a day   Social Determinants of Health   Financial Resource Strain: Not on file  Food Insecurity: Not on file  Transportation Needs: Not on file  Physical Activity: Not on file  Stress: Not on file  Social Connections: Not on file     Family History:  The patient's family history includes Asthma in his mother; Diabetes in his brother; Heart attack (age of onset: 90) in his father.   Review of Systems:    Please see the history of present illness.     All other systems reviewed and are otherwise negative except as noted above.   Physical Exam:    VS:  BP 122/76   Pulse 76   Ht 6' (1.829 m)   Wt 211 lb 6.4 oz (95.9 kg)   SpO2 95%   BMI 28.67 kg/m    General: Pleasant, elderly male appearing in no acute distress. Head: Normocephalic, atraumatic. Neck: No carotid bruits. JVD not elevated.  Lungs: Respirations regular and unlabored, without wheezes or rales.  Heart: Regular rate and rhythm. No S3 or S4.  No murmur, no rubs, or  gallops appreciated. Abdomen: Appears non-distended. No obvious abdominal masses. Msk:  Strength and tone appear normal for age. No obvious joint  deformities or effusions. Extremities: No clubbing or cyanosis. 1+ pitting edema up to mid-shins.  Distal pedal pulses are 2+ bilaterally.  Compression stockings in place. Neuro: Alert and oriented X 3. Moves all extremities spontaneously. No focal deficits noted. Psych:  Responds to questions appropriately with a normal affect. Skin: No rashes or lesions noted  Wt Readings from Last 3 Encounters:  08/11/21 211 lb 6.4 oz (95.9 kg)  07/15/21 213 lb 10 oz (96.9 kg)  07/10/21 210 lb 12.8 oz (95.6 kg)     Studies/Labs Reviewed:   EKG:  EKG is not ordered today.    Recent Labs: 07/15/2021: TSH 2.741 07/16/2021: ALT 20; BUN 17; Creatinine, Ser 0.78; Hemoglobin 10.8; Magnesium 1.8; Platelets 107; Potassium 3.3; Sodium 136   Lipid Panel    Component Value Date/Time   CHOL 145 05/12/2020 1101   TRIG 79 05/12/2020 1101   HDL 48 05/12/2020 1101   CHOLHDL 3.0 05/12/2020 1101   VLDL 16 05/12/2020 1101   LDLCALC 81 05/12/2020 1101    Additional studies/ records that were reviewed today include:   Limited Echo: 05/2020 IMPRESSIONS     1. Left ventricular ejection fraction, by estimation, is 25 to 30%. The  left ventricle has severely decreased function. The left ventricle  demonstrates global hypokinesis. There is moderate left ventricular  hypertrophy.   2. Right ventricular systolic function is normal. The right ventricular  size is mildly enlarged. There is normal pulmonary artery systolic  pressure. The estimated right ventricular systolic pressure is 63.1 mmHg.   3. Left atrial size was moderately dilated.   4. Right atrial size was mildly dilated.   5. The mitral valve is degenerative. Mild mitral valve regurgitation. No  evidence of mitral stenosis. Moderate mitral annular calcification.   6. The aortic valve is tricuspid. Aortic valve  regurgitation is mild.  Mild to moderate aortic valve sclerosis/calcification is present, without  any evidence of aortic stenosis.   7. The inferior vena cava is normal in size with greater than 50%  respiratory variability, suggesting right atrial pressure of 3 mmHg.   Comparison(s): No significant change from prior study. Prior images  reviewed side by side.   Assessment:    1. HFrEF (heart failure with reduced ejection fraction) ([place])   2. Coronary artery disease involving native coronary artery of native heart without angina pectoris   3. Essential hypertension   4. Hyperlipidemia LDL goal <70   5. Heart block AV complete (HCC)      Plan:   In order of problems listed above:  1. HFrEF - He has a known cardiomyopathy with EF of 25 to 30% by most recent echocardiogram last year.  Thankfully, he continues to do well and denies any recent dyspnea on exertion, orthopnea or PND. He does have 1+ pitting edema on examination today but overall improved when compared to his last office visit. - Continue current medical therapy with Bisoprolol 2.5 mg daily, Jardiance 10 mg daily, Entresto 24-26 mg twice daily and Lasix 40 mg daily. He has not been on Spironolactone given a history of hyperkalemia. We will request a copy of most recent labs from his PCP to make sure his renal function improved following his recent admission.  2. CAD - He is s/p CABG in 2002 and had no ischemia by NST in 11/2013. Would anticipate a repeat stress test in the next 1 to 2 years for repeat ischemic evaluation unless symptoms indicate sooner evaluation. - Continue ASA 81 mg daily, Atorvastatin  80 mg daily and Bisoprolol 2.5 mg daily.  3. HTN - His BP is well controlled at 122/76 during today's visit. Continue current medical therapy.  4. HLD - Will request a copy of most recent labs. He remains on Atorvastatin 80 mg daily. LDL was previously elevated when compared to prior values but he had been consuming a  diet high in saturated fats and has made dietary changes.  5. SSS - He is s/p St. Jude Dual PPM in 05/2020 and his device is followed by Dr. Lovena Le. He actually had a remote interrogation today which showed his device was functioning normally.    Medication Adjustments/Labs and Tests Ordered: Current medicines are reviewed at length with the patient today.  Concerns regarding medicines are outlined above.  Medication changes, Labs and Tests ordered today are listed in the Patient Instructions below. Patient Instructions  Medication Instructions:  Your physician recommends that you continue on your current medications as directed. Please refer to the Current Medication list given to you today.  *If you need a refill on your cardiac medications before your next appointment, please call your pharmacy*   Lab Work: NONE   If you have labs (blood work) drawn today and your tests are completely normal, you will receive your results only by: [place] (if you have MyChart) OR A paper copy in the mail If you have any lab test that is abnormal or we need to change your treatment, we will call you to review the results.   Testing/Procedures: NONE    Follow-Up: At [facility], you and your health needs are our priority.  As part of our continuing mission to provide you with exceptional heart care, we have created designated Provider Care Teams.  These Care Teams include your primary Cardiologist (physician) and Advanced Practice Providers (APPs -  Physician Assistants and Nurse Practitioners) who all work together to provide you with the care you need, when you need it.  We recommend signing up for the patient portal called "MyChart".  Sign up information is provided on this After Visit Summary.  MyChart is used to connect with patients for Virtual Visits (Telemedicine).  Patients are able to view lab/test results, encounter notes, upcoming appointments, etc.  Non-urgent messages can be  sent to your provider as well.   To learn more about what you can do with MyChart, go to [email or web address].    Your next appointment:   4 -5 month(s)  The format for your next appointment:   In Person  Provider:   You may see Rozann Lesches, MD or one of the following Advanced Practice Providers on your designated Care Team:   Bernerd Pho, PA-C  Ermalinda Barrios, PA-C     Other Instructions Thank you for choosing [facility]!    Important Information About Sugar         Signed, Erma Heritage, PA-C  08/11/2021 5:02 PM    [facility] [address] [place], [place] [address] Phone: [phone] Fax: [phone]

## 2021-08-11 NOTE — Patient Instructions (Signed)
Medication Instructions:  Your physician recommends that you continue on your current medications as directed. Please refer to the Current Medication list given to you today.  *If you need a refill on your cardiac medications before your next appointment, please call your pharmacy*   Lab Work: NONE   If you have labs (blood work) drawn today and your tests are completely normal, you will receive your results only by: [place] (if you have MyChart) OR A paper copy in the mail If you have any lab test that is abnormal or we need to change your treatment, we will call you to review the results.   Testing/Procedures: NONE    Follow-Up: At [facility], you and your health needs are our priority.  As part of our continuing mission to provide you with exceptional heart care, we have created designated Provider Care Teams.  These Care Teams include your primary Cardiologist (physician) and Advanced Practice Providers (APPs -  Physician Assistants and Nurse Practitioners) who all work together to provide you with the care you need, when you need it.  We recommend signing up for the patient portal called "MyChart".  Sign up information is provided on this After Visit Summary.  MyChart is used to connect with patients for Virtual Visits (Telemedicine).  Patients are able to view lab/test results, encounter notes, upcoming appointments, etc.  Non-urgent messages can be sent to your provider as well.   To learn more about what you can do with MyChart, go to [email or web address].    Your next appointment:   4 -5 month(s)  The format for your next appointment:   In Person  Provider:   You may see Rozann Lesches, MD or one of the following Advanced Practice Providers on your designated Care Team:   Bernerd Pho, PA-C  Ermalinda Barrios, PA-C     Other Instructions Thank you for choosing [facility]!    Important Information About Sugar

## 2021-08-12 DIAGNOSIS — I5022 Chronic systolic (congestive) heart failure: Secondary | ICD-10-CM | POA: Diagnosis not present

## 2021-08-12 DIAGNOSIS — I252 Old myocardial infarction: Secondary | ICD-10-CM | POA: Diagnosis not present

## 2021-08-12 DIAGNOSIS — N400 Enlarged prostate without lower urinary tract symptoms: Secondary | ICD-10-CM | POA: Diagnosis not present

## 2021-08-12 DIAGNOSIS — I110 Hypertensive heart disease with heart failure: Secondary | ICD-10-CM | POA: Diagnosis not present

## 2021-08-12 DIAGNOSIS — F319 Bipolar disorder, unspecified: Secondary | ICD-10-CM | POA: Diagnosis not present

## 2021-08-12 DIAGNOSIS — Z7982 Long term (current) use of aspirin: Secondary | ICD-10-CM | POA: Diagnosis not present

## 2021-08-12 DIAGNOSIS — I429 Cardiomyopathy, unspecified: Secondary | ICD-10-CM | POA: Diagnosis not present

## 2021-08-12 DIAGNOSIS — K529 Noninfective gastroenteritis and colitis, unspecified: Secondary | ICD-10-CM | POA: Diagnosis not present

## 2021-08-12 DIAGNOSIS — E538 Deficiency of other specified B group vitamins: Secondary | ICD-10-CM | POA: Diagnosis not present

## 2021-08-12 DIAGNOSIS — Z950 Presence of cardiac pacemaker: Secondary | ICD-10-CM | POA: Diagnosis not present

## 2021-08-12 DIAGNOSIS — Z87891 Personal history of nicotine dependence: Secondary | ICD-10-CM | POA: Diagnosis not present

## 2021-08-12 DIAGNOSIS — I4891 Unspecified atrial fibrillation: Secondary | ICD-10-CM | POA: Diagnosis not present

## 2021-08-12 DIAGNOSIS — I2510 Atherosclerotic heart disease of native coronary artery without angina pectoris: Secondary | ICD-10-CM | POA: Diagnosis not present

## 2021-08-12 DIAGNOSIS — Z7984 Long term (current) use of oral hypoglycemic drugs: Secondary | ICD-10-CM | POA: Diagnosis not present

## 2021-08-12 DIAGNOSIS — N529 Male erectile dysfunction, unspecified: Secondary | ICD-10-CM | POA: Diagnosis not present

## 2021-08-12 DIAGNOSIS — Z951 Presence of aortocoronary bypass graft: Secondary | ICD-10-CM | POA: Diagnosis not present

## 2021-08-12 DIAGNOSIS — G9341 Metabolic encephalopathy: Secondary | ICD-10-CM | POA: Diagnosis not present

## 2021-08-12 DIAGNOSIS — E782 Mixed hyperlipidemia: Secondary | ICD-10-CM | POA: Diagnosis not present

## 2021-08-12 DIAGNOSIS — K219 Gastro-esophageal reflux disease without esophagitis: Secondary | ICD-10-CM | POA: Diagnosis not present

## 2021-08-12 DIAGNOSIS — G4700 Insomnia, unspecified: Secondary | ICD-10-CM | POA: Diagnosis not present

## 2021-08-12 DIAGNOSIS — J45909 Unspecified asthma, uncomplicated: Secondary | ICD-10-CM | POA: Diagnosis not present

## 2021-08-12 DIAGNOSIS — E860 Dehydration: Secondary | ICD-10-CM | POA: Diagnosis not present

## 2021-08-12 DIAGNOSIS — K589 Irritable bowel syndrome without diarrhea: Secondary | ICD-10-CM | POA: Diagnosis not present

## 2021-08-12 DIAGNOSIS — Z8551 Personal history of malignant neoplasm of bladder: Secondary | ICD-10-CM | POA: Diagnosis not present

## 2021-08-26 ENCOUNTER — Ambulatory Visit: Payer: PPO | Admitting: Orthopedic Surgery

## 2021-08-26 ENCOUNTER — Encounter: Payer: Self-pay | Admitting: Orthopedic Surgery

## 2021-08-26 VITALS — BP 141/72 | HR 83 | Ht 72.0 in | Wt 211.0 lb

## 2021-08-26 DIAGNOSIS — M1711 Unilateral primary osteoarthritis, right knee: Secondary | ICD-10-CM

## 2021-08-26 DIAGNOSIS — M25561 Pain in right knee: Secondary | ICD-10-CM

## 2021-08-26 MED ORDER — METHYLPREDNISOLONE ACETATE 40 MG/ML IJ SUSP
40.0000 mg | Freq: Once | INTRAMUSCULAR | Status: AC
  Administered 2021-08-26: 40 mg via INTRA_ARTICULAR

## 2021-08-26 NOTE — Progress Notes (Signed)
Chief Complaint  Patient presents with   Knee Pain    Rt knee pain and swelling for 3-4 days.    81 year old male with a history of coronary artery disease status post bypass graft status post pacemaker placement presents with acute increase in right knee pain on top of his chronic arthritic right knee pain.  He says he is ready to have his knee replaced.  However, his primary care doctor Dr. Willey Blade recommended that he not have surgery.  His next cardiology appointment is in January his pacemaker should be checked on the 24th of this month  He has acute pain and swelling again of his right knee  Patient is ambulatory with a walker he has a varus deformity with a flexed knee he is able to weight-bear  He has an effusion of the right knee he has tenderness on the medial joint line he has pain with extension and he lacks the last 10 degrees of extension and can flex the knee up to about 105 degrees  We did aspirate his knee obtaining about 20 cc of clear yellow fluid he was injected with Depo-Medrol  He will need to see cardiology to answer the following 2 questions  1.  Is his heart strong enough to have surgery #2 since he has significant heart disease should the surgery be done in [place] or in [place].Procedure note injection and aspiration right knee joint  Verbal consent was obtained to aspirate and inject the right knee joint   Timeout was completed to confirm the site of aspiration and injection  An 18-gauge needle was used to aspirate the knee joint from a suprapatellar lateral approach.  The medications used were 40 mg of Depo-Medrol and 1% lidocaine 3 cc  Anesthesia was provided by ethyl chloride and the skin was prepped with alcohol.  After cleaning the skin with alcohol an 18-gauge needle was used to aspirate the right knee joint.  We obtained 20  cc of fluid clr  We follow this by injection of 40 mg of Depo-Medrol and 3 cc 1% lidocaine.  There were no  complications. A sterile bandage was applied.  Encounter Diagnoses  Name Primary?   Primary osteoarthritis of right knee Yes   Acute pain of right knee

## 2021-08-31 DIAGNOSIS — H903 Sensorineural hearing loss, bilateral: Secondary | ICD-10-CM | POA: Diagnosis not present

## 2021-09-02 ENCOUNTER — Encounter: Payer: PPO | Admitting: Internal Medicine

## 2021-09-03 NOTE — Progress Notes (Signed)
Remote pacemaker transmission.   

## 2021-09-08 DIAGNOSIS — F3110 Bipolar disorder, current episode manic without psychotic features, unspecified: Secondary | ICD-10-CM | POA: Diagnosis not present

## 2021-09-09 DIAGNOSIS — I110 Hypertensive heart disease with heart failure: Secondary | ICD-10-CM | POA: Diagnosis not present

## 2021-09-09 DIAGNOSIS — K219 Gastro-esophageal reflux disease without esophagitis: Secondary | ICD-10-CM | POA: Diagnosis not present

## 2021-09-09 DIAGNOSIS — E860 Dehydration: Secondary | ICD-10-CM | POA: Diagnosis not present

## 2021-09-09 DIAGNOSIS — J45909 Unspecified asthma, uncomplicated: Secondary | ICD-10-CM | POA: Diagnosis not present

## 2021-09-09 DIAGNOSIS — N400 Enlarged prostate without lower urinary tract symptoms: Secondary | ICD-10-CM | POA: Diagnosis not present

## 2021-09-09 DIAGNOSIS — Z7982 Long term (current) use of aspirin: Secondary | ICD-10-CM | POA: Diagnosis not present

## 2021-09-09 DIAGNOSIS — G4700 Insomnia, unspecified: Secondary | ICD-10-CM | POA: Diagnosis not present

## 2021-09-09 DIAGNOSIS — K529 Noninfective gastroenteritis and colitis, unspecified: Secondary | ICD-10-CM | POA: Diagnosis not present

## 2021-09-09 DIAGNOSIS — Z87891 Personal history of nicotine dependence: Secondary | ICD-10-CM | POA: Diagnosis not present

## 2021-09-09 DIAGNOSIS — I5022 Chronic systolic (congestive) heart failure: Secondary | ICD-10-CM | POA: Diagnosis not present

## 2021-09-09 DIAGNOSIS — K589 Irritable bowel syndrome without diarrhea: Secondary | ICD-10-CM | POA: Diagnosis not present

## 2021-09-09 DIAGNOSIS — E538 Deficiency of other specified B group vitamins: Secondary | ICD-10-CM | POA: Diagnosis not present

## 2021-09-09 DIAGNOSIS — G9341 Metabolic encephalopathy: Secondary | ICD-10-CM | POA: Diagnosis not present

## 2021-09-09 DIAGNOSIS — Z951 Presence of aortocoronary bypass graft: Secondary | ICD-10-CM | POA: Diagnosis not present

## 2021-09-09 DIAGNOSIS — I4891 Unspecified atrial fibrillation: Secondary | ICD-10-CM | POA: Diagnosis not present

## 2021-09-09 DIAGNOSIS — Z950 Presence of cardiac pacemaker: Secondary | ICD-10-CM | POA: Diagnosis not present

## 2021-09-09 DIAGNOSIS — N529 Male erectile dysfunction, unspecified: Secondary | ICD-10-CM | POA: Diagnosis not present

## 2021-09-09 DIAGNOSIS — F319 Bipolar disorder, unspecified: Secondary | ICD-10-CM | POA: Diagnosis not present

## 2021-09-09 DIAGNOSIS — I252 Old myocardial infarction: Secondary | ICD-10-CM | POA: Diagnosis not present

## 2021-09-09 DIAGNOSIS — Z7984 Long term (current) use of oral hypoglycemic drugs: Secondary | ICD-10-CM | POA: Diagnosis not present

## 2021-09-09 DIAGNOSIS — E782 Mixed hyperlipidemia: Secondary | ICD-10-CM | POA: Diagnosis not present

## 2021-09-09 DIAGNOSIS — I429 Cardiomyopathy, unspecified: Secondary | ICD-10-CM | POA: Diagnosis not present

## 2021-09-09 DIAGNOSIS — Z8551 Personal history of malignant neoplasm of bladder: Secondary | ICD-10-CM | POA: Diagnosis not present

## 2021-09-09 DIAGNOSIS — I2510 Atherosclerotic heart disease of native coronary artery without angina pectoris: Secondary | ICD-10-CM | POA: Diagnosis not present

## 2021-09-13 ENCOUNTER — Emergency Department (HOSPITAL_COMMUNITY)
Admission: EM | Admit: 2021-09-13 | Discharge: 2021-09-13 | Disposition: A | Discharge status: Home / Self Care | Payer: PPO | Attending: Emergency Medicine | Admitting: Emergency Medicine

## 2021-09-13 ENCOUNTER — Encounter (HOSPITAL_COMMUNITY): Payer: Self-pay | Admitting: Emergency Medicine

## 2021-09-13 DIAGNOSIS — W57XXXA Bitten or stung by nonvenomous insect and other nonvenomous arthropods, initial encounter: Secondary | ICD-10-CM | POA: Diagnosis not present

## 2021-09-13 DIAGNOSIS — S30861A Insect bite (nonvenomous) of abdominal wall, initial encounter: Secondary | ICD-10-CM | POA: Diagnosis not present

## 2021-09-13 DIAGNOSIS — Z7982 Long term (current) use of aspirin: Secondary | ICD-10-CM | POA: Insufficient documentation

## 2021-09-13 DIAGNOSIS — S2096XA Insect bite (nonvenomous) of unspecified parts of thorax, initial encounter: Secondary | ICD-10-CM

## 2021-09-13 DIAGNOSIS — S20462A Insect bite (nonvenomous) of left back wall of thorax, initial encounter: Secondary | ICD-10-CM | POA: Diagnosis not present

## 2021-09-13 DIAGNOSIS — S3092XA Unspecified superficial injury of abdominal wall, initial encounter: Secondary | ICD-10-CM | POA: Diagnosis present

## 2021-09-13 MED ORDER — DOXYCYCLINE HYCLATE 100 MG PO CAPS: 100.0000 mg | ORAL_CAPSULE | Freq: Two times a day (BID) | ORAL | 0 refills | Status: DC

## 2021-09-13 MED ORDER — EPINEPHRINE 0.3 MG/0.3ML IJ SOAJ: 0.3000 mg | INTRAMUSCULAR | 0 refills | Status: DC | PRN

## 2021-09-13 NOTE — ED Triage Notes (Signed)
Pt c/o multiple tick bites to left arm and to right flank area.

## 2021-09-13 NOTE — ED Provider Notes (Signed)
[facility] EMERGENCY DEPARTMENT Provider Note   CSN: [id number] Arrival date & time: 09/13/21  0101     History  Chief Complaint  Patient presents with   Tick Bites    Grant Stevens is a 81 y.o. male.  Patient presents to the emergency department for evaluation of tick bite.  Patient reports that he had a tick bite on his right flank.  He thinks he remove the tick.  Since then, however, he has had some itching and a rash on his left arm.  No fever.       Home Medications Prior to Admission medications   Medication Sig Start Date End Date Taking? Authorizing Provider  EPINEPHrine 0.3 mg/0.3 mL IJ SOAJ injection Inject 0.3 mg into the muscle as needed for anaphylaxis. 09/13/21  Yes Yoseline Andersson, Gwenyth Allegra, MD  aspirin EC 81 MG tablet Take 1 tablet (81 mg total) by mouth daily. 03/31/18   Rehman, Mechele Dawley, MD  atorvastatin (LIPITOR) 80 MG tablet Take 80 mg by mouth at bedtime.      [provider]  bisoprolol (ZEBETA) 5 MG tablet Take 0.5 tablets (2.5 mg total) by mouth daily. 12/18/20   [name], MD  carbamazepine (TEGRETOL) 200 MG tablet Take 400 mg by mouth 2 (two) times daily.     [provider]  divalproex (DEPAKOTE ER) 250 MG 24 hr tablet Take 250 mg by mouth daily. 03/12/18   [provider]  doxycycline (VIBRAMYCIN) 100 MG capsule Take 1 capsule (100 mg total) by mouth 2 (two) times daily. 09/13/21   Orpah Greek, MD  empagliflozin (JARDIANCE) 10 MG TABS tablet Take 1 tablet (10 mg total) by mouth daily before breakfast. 07/06/21   Rising, Clanford L, MD  famotidine (PEPCID) 40 MG tablet TAKE (1) TABLET BY MOUTH AT BEDTIME. 08/09/21   Carlan, Chelsea L, NP  furosemide (LASIX) 40 MG tablet Take 0.5-1 tablets (20-40 mg total) by mouth daily. '20mg'$  one day then '40mg'$  the other day alternating '20mg'$ -'40mg'$  Patient taking differently: Take 40 mg by mouth daily. Take one tablet daily 07/05/21   Irwin Brakeman L, MD  loperamide (IMODIUM) 2 MG  capsule Take 1 capsule (2 mg total) by mouth every 6 (six) hours as needed for diarrhea or loose stools. 07/16/21   Kathie Dike, MD  LORazepam (ATIVAN) 0.5 MG tablet Take 0.5 mg by mouth at bedtime.    [provider]  pantoprazole (PROTONIX) 40 MG tablet Take 1 tablet (40 mg total) by mouth daily before breakfast. 07/08/21   Carlan, Chelsea L, NP  sacubitril-valsartan (ENTRESTO) 24-26 MG Take 1 tablet by mouth 2 (two) times daily. 11/23/20   [name], MD  silodosin (RAPAFLO) 8 MG CAPS capsule Take 1 capsule (8 mg total) by mouth daily with breakfast. 07/07/21   McKenzie, Candee Furbish, MD  tamsulosin (FLOMAX) 0.4 MG CAPS capsule Take 1 capsule (0.4 mg total) by mouth at bedtime. 07/07/20   McKenzie, Candee Furbish, MD  temazepam (RESTORIL) 15 MG capsule Take 15 mg by mouth at bedtime as needed for sleep.    [provider]  vitamin B-12 (CYANOCOBALAMIN) 1000 MCG tablet Take 1 tablet (1,000 mcg total) by mouth daily. 07/16/21   Kathie Dike, MD      Allergies    Spironolactone    Review of Systems   Review of Systems  Physical Exam Updated Vital Signs BP (!) 153/90   Pulse 80   Temp 97.7 F (36.5 C) (Oral)  Resp (!) 22   Ht 6' (1.829 m)   Wt 95.7 kg   SpO2 98%   BMI 28.62 kg/m  Physical Exam Vitals and nursing note reviewed.  Constitutional:      General: He is not in acute distress.    Appearance: He is well-developed.  HENT:     Head: Normocephalic and atraumatic.     Mouth/Throat:     Mouth: Mucous membranes are moist.  Eyes:     General: Vision grossly intact. Gaze aligned appropriately.     Extraocular Movements: Extraocular movements intact.     Conjunctiva/sclera: Conjunctivae normal.  Cardiovascular:     Rate and Rhythm: Normal rate and regular rhythm.     Pulses: Normal pulses.     Heart sounds: Normal heart sounds, S1 normal and S2 normal. No murmur heard.    No friction rub. No gallop.  Pulmonary:     Effort: Pulmonary effort is normal. No  respiratory distress.     Breath sounds: Normal breath sounds.  Abdominal:     Palpations: Abdomen is soft.     Tenderness: There is no abdominal tenderness. There is no guarding or rebound.     Hernia: No hernia is present.  Musculoskeletal:        General: No swelling.     Cervical back: Full passive range of motion without pain, normal range of motion and neck supple. No pain with movement, spinous process tenderness or muscular tenderness. Normal range of motion.     Right lower leg: No edema.     Left lower leg: No edema.  Skin:    General: Skin is warm and dry.     Capillary Refill: Capillary refill takes less than 2 seconds.     Findings: Wound (3 mm excoriated area right flank at site of tick bite, no erythema chronicum migrans) present. No ecchymosis, erythema or lesion.     Comments: Scratches and excoriations left forearm, no associated rash  Neurological:     Mental Status: He is alert and oriented to person, place, and time.     GCS: GCS eye subscore is 4. GCS verbal subscore is 5. GCS motor subscore is 6.     Cranial Nerves: Cranial nerves 2-12 are intact.     Sensory: Sensation is intact.     Motor: Motor function is intact. No weakness or abnormal muscle tone.     Coordination: Coordination is intact.  Psychiatric:        Mood and Affect: Mood normal.        Speech: Speech normal.        Behavior: Behavior normal.     ED Results / Procedures / Treatments   Labs (all labs ordered are listed, but only abnormal results are displayed) Labs Reviewed - No data to display  EKG None  Radiology No results found.  Procedures Procedures    Medications Ordered in ED Medications - No data to display  ED Course/ Medical Decision Making/ A&P                           Medical Decision Making Risk Prescription drug management.   Patient presents to the emergency department for evaluation of tick bite.  He does have evidence of a tick bite to the right flank  without any sequela of Lyme's.  No other associated rash that would suggest [facility] spotted fever.  He has not had a fever.  He  is experiencing an itchy rash on his left forearm, unclear etiology.  Discussed with patient, most prudent thing would be to empirically treat for the recent tick bite.  Patient also asking about his EpiPen.  He is not sure how old they are.  Markings on the EpiPen indicate that it expired in September 2018.  These were disposed.  A prescription was sent to his pharmacy for EpiPen.        Final Clinical Impression(s) / ED Diagnoses Final diagnoses:  Tick bite of thoracic wall, unspecified whether front or back, initial encounter    Rx / DC Orders ED Discharge Orders          Ordered    doxycycline (VIBRAMYCIN) 100 MG capsule  2 times daily,   Status:  Discontinued        09/13/21 0123    EPINEPHrine 0.3 mg/0.3 mL IJ SOAJ injection  As needed        09/13/21 0123    doxycycline (VIBRAMYCIN) 100 MG capsule  2 times daily        09/13/21 0123              Orpah Greek, MD 09/13/21 0126

## 2021-09-16 ENCOUNTER — Ambulatory Visit (INDEPENDENT_AMBULATORY_CARE_PROVIDER_SITE_OTHER): Payer: PPO | Admitting: Gastroenterology

## 2021-09-20 ENCOUNTER — Other Ambulatory Visit: Payer: Self-pay | Admitting: Student

## 2021-09-22 DIAGNOSIS — Z79899 Other long term (current) drug therapy: Secondary | ICD-10-CM | POA: Diagnosis not present

## 2021-09-28 ENCOUNTER — Other Ambulatory Visit: Payer: Self-pay | Admitting: Student

## 2021-10-05 DIAGNOSIS — H26492 Other secondary cataract, left eye: Secondary | ICD-10-CM | POA: Diagnosis not present

## 2021-10-05 DIAGNOSIS — H0102B Squamous blepharitis left eye, upper and lower eyelids: Secondary | ICD-10-CM | POA: Diagnosis not present

## 2021-10-05 DIAGNOSIS — H0102A Squamous blepharitis right eye, upper and lower eyelids: Secondary | ICD-10-CM | POA: Diagnosis not present

## 2021-10-05 DIAGNOSIS — H04123 Dry eye syndrome of bilateral lacrimal glands: Secondary | ICD-10-CM | POA: Diagnosis not present

## 2021-10-09 DIAGNOSIS — I2510 Atherosclerotic heart disease of native coronary artery without angina pectoris: Secondary | ICD-10-CM | POA: Diagnosis not present

## 2021-10-09 DIAGNOSIS — I5022 Chronic systolic (congestive) heart failure: Secondary | ICD-10-CM | POA: Diagnosis not present

## 2021-10-30 ENCOUNTER — Emergency Department (HOSPITAL_COMMUNITY)
Admission: EM | Admit: 2021-10-30 | Discharge: 2021-10-30 | Disposition: A | Discharge status: Home / Self Care | Payer: PPO | Attending: Emergency Medicine | Admitting: Emergency Medicine

## 2021-10-30 ENCOUNTER — Emergency Department (HOSPITAL_COMMUNITY): Payer: PPO

## 2021-10-30 ENCOUNTER — Encounter (HOSPITAL_COMMUNITY): Payer: Self-pay | Admitting: Emergency Medicine

## 2021-10-30 ENCOUNTER — Other Ambulatory Visit: Payer: Self-pay

## 2021-10-30 DIAGNOSIS — I2510 Atherosclerotic heart disease of native coronary artery without angina pectoris: Secondary | ICD-10-CM | POA: Diagnosis not present

## 2021-10-30 DIAGNOSIS — J45909 Unspecified asthma, uncomplicated: Secondary | ICD-10-CM | POA: Insufficient documentation

## 2021-10-30 DIAGNOSIS — I509 Heart failure, unspecified: Secondary | ICD-10-CM | POA: Diagnosis not present

## 2021-10-30 DIAGNOSIS — M25561 Pain in right knee: Secondary | ICD-10-CM | POA: Diagnosis not present

## 2021-10-30 DIAGNOSIS — G8929 Other chronic pain: Secondary | ICD-10-CM | POA: Insufficient documentation

## 2021-10-30 DIAGNOSIS — R159 Full incontinence of feces: Secondary | ICD-10-CM | POA: Diagnosis not present

## 2021-10-30 DIAGNOSIS — Z79899 Other long term (current) drug therapy: Secondary | ICD-10-CM | POA: Insufficient documentation

## 2021-10-30 DIAGNOSIS — I110 Hypertensive heart disease with heart failure: Secondary | ICD-10-CM | POA: Insufficient documentation

## 2021-10-30 DIAGNOSIS — Z7982 Long term (current) use of aspirin: Secondary | ICD-10-CM | POA: Diagnosis not present

## 2021-10-30 DIAGNOSIS — Z8551 Personal history of malignant neoplasm of bladder: Secondary | ICD-10-CM | POA: Diagnosis not present

## 2021-10-30 MED ORDER — ACETAMINOPHEN 500 MG PO TABS
1000.0000 mg | ORAL_TABLET | Freq: Once | ORAL | Status: AC
  Administered 2021-10-30: 1000 mg via ORAL
  Filled 2021-10-30: qty 2

## 2021-10-30 NOTE — Discharge Instructions (Signed)
Seen today for your knee pain.  Your x-ray shows osteoporosis.  Follow-up again with Dr. Aline Brochure.  Regarding your bowel incontinence, this has been ongoing and is not new.  Follow-up closely with Dr. Willey Blade for recommendations for ongoing management.

## 2021-10-30 NOTE — ED Provider Notes (Signed)
[facility] EMERGENCY DEPARTMENT Provider Note   CSN: [id number] Arrival date & time: 10/30/21  0409     History  Chief Complaint  Patient presents with   Knee Pain    Grant Stevens is a 81 y.o. male.  HPI     This is an 81 year old male who presents with right knee pain.  Patient reports history of chronic right knee pain.  He sees Dr. Aline Brochure and states that he needs to have a surgery.  However, this morning he woke up having worsening pain.  Patient also states that he is having ongoing issues with bowel incontinence.  He states that this has been going on for years.  He states "I just crap in my pants before I know I need to go."  He reports that he feels like he needs to see a specialist.  He denies any abdominal pain, urinary symptoms, nausea or vomiting.  He has seen his primary doctor for this.  Home Medications Prior to Admission medications   Medication Sig Start Date End Date Taking? Authorizing Provider  aspirin EC 81 MG tablet Take 1 tablet (81 mg total) by mouth daily. 03/31/18   Rehman, Mechele Dawley, MD  atorvastatin (LIPITOR) 80 MG tablet Take 80 mg by mouth at bedtime.      [provider]  bisoprolol (ZEBETA) 5 MG tablet Take 0.5 tablets (2.5 mg total) by mouth daily. 12/18/20   [name], MD  carbamazepine (TEGRETOL) 200 MG tablet Take 400 mg by mouth 2 (two) times daily.     [provider]  divalproex (DEPAKOTE ER) 250 MG 24 hr tablet Take 250 mg by mouth daily. 03/12/18   [provider]  doxycycline (VIBRAMYCIN) 100 MG capsule Take 1 capsule (100 mg total) by mouth 2 (two) times daily. 09/13/21   Orpah Greek, MD  empagliflozin (JARDIANCE) 10 MG TABS tablet Take 1 tablet (10 mg total) by mouth daily before breakfast. 07/06/21   Roehrig, Clanford L, MD  EPINEPHrine 0.3 mg/0.3 mL IJ SOAJ injection Inject 0.3 mg into the muscle as needed for anaphylaxis. 09/13/21   Orpah Greek, MD  famotidine (PEPCID) 40 MG tablet  TAKE (1) TABLET BY MOUTH AT BEDTIME. 08/09/21   Carlan, Chelsea L, NP  furosemide (LASIX) 40 MG tablet Take 0.5-1 tablets (20-40 mg total) by mouth daily. '20mg'$  one day then '40mg'$  the other day alternating '20mg'$ -'40mg'$  Patient taking differently: Take 40 mg by mouth daily. Take one tablet daily 07/05/21   Irwin Brakeman L, MD  loperamide (IMODIUM) 2 MG capsule Take 1 capsule (2 mg total) by mouth every 6 (six) hours as needed for diarrhea or loose stools. 07/16/21   Kathie Dike, MD  LORazepam (ATIVAN) 0.5 MG tablet Take 0.5 mg by mouth at bedtime.    [provider]  pantoprazole (PROTONIX) 40 MG tablet Take 1 tablet (40 mg total) by mouth daily before breakfast. 07/08/21   Carlan, Chelsea L, NP  sacubitril-valsartan (ENTRESTO) 24-26 MG Take 1 tablet by mouth 2 (two) times daily. 11/23/20   [name], MD  silodosin (RAPAFLO) 8 MG CAPS capsule Take 1 capsule (8 mg total) by mouth daily with breakfast. 07/07/21   McKenzie, Candee Furbish, MD  tamsulosin (FLOMAX) 0.4 MG CAPS capsule Take 1 capsule (0.4 mg total) by mouth at bedtime. 07/07/20   McKenzie, Candee Furbish, MD  temazepam (RESTORIL) 15 MG capsule Take 15 mg by mouth at bedtime as needed for sleep.    [provider]  vitamin B-12 (CYANOCOBALAMIN) 1000 MCG tablet Take 1 tablet (1,000 mcg total) by mouth daily. 07/16/21   Kathie Dike, MD      Allergies    Spironolactone    Review of Systems   Review of Systems  Constitutional:  Negative for fever.  Respiratory:  Negative for shortness of breath.   Cardiovascular:  Negative for chest pain.  Gastrointestinal:  Negative for abdominal pain, nausea and vomiting.  All other systems reviewed and are negative.   Physical Exam Updated Vital Signs BP 122/63   Pulse (!) 59   Temp 98.5 F (36.9 C) (Oral)   Resp 18   SpO2 97%  Physical Exam Vitals and nursing note reviewed.  Constitutional:      Appearance: He is well-developed.  HENT:     Head: Normocephalic and  atraumatic.  Eyes:     Pupils: Pupils are equal, round, and reactive to light.  Cardiovascular:     Rate and Rhythm: Normal rate and regular rhythm.     Heart sounds: Normal heart sounds. No murmur heard. Pulmonary:     Effort: Pulmonary effort is normal. No respiratory distress.     Breath sounds: Normal breath sounds. No wheezing.  Abdominal:     General: Bowel sounds are normal.     Palpations: Abdomen is soft.     Tenderness: There is no abdominal tenderness. There is no rebound.  Musculoskeletal:     Cervical back: Neck supple.     Comments: Normal range of motion of the right knee, crepitus noted, no obvious effusions, no overlying skin changes, no obvious deformities  Lymphadenopathy:     Cervical: No cervical adenopathy.  Skin:    General: Skin is warm and dry.  Neurological:     Mental Status: He is alert and oriented to person, place, and time.  Psychiatric:        Mood and Affect: Mood normal.     ED Results / Procedures / Treatments   Labs (all labs ordered are listed, but only abnormal results are displayed) Labs Reviewed - No data to display  EKG None  Radiology DG Knee Complete 4 Views Right  Result Date: 10/30/2021 CLINICAL DATA:  Right knee pain EXAM: RIGHT KNEE - COMPLETE 4 VIEW COMPARISON:  02/20/2020 FINDINGS: Generalized subcutaneous reticulation. Clips in the medial leg usually from vein harvest. Generalized osteopenia. Osteoarthritic joint space narrowing and spurring at the medial compartment. No acute fracture or subluxation. No joint effusion seen. IMPRESSION: 1. No acute osseous finding. 2. Generalized soft tissue swelling. 3. Osteoarthritis at the medial compartment. Electronically Signed   By: Jorje Guild M.D.   On: 10/30/2021 05:32    Procedures Procedures    Medications Ordered in ED Medications  acetaminophen (TYLENOL) tablet 1,000 mg (1,000 mg Oral Given 10/30/21 7412)    ED Course/ Medical Decision Making/ A&P                            Medical Decision Making Amount and/or Complexity of Data Reviewed Radiology: ordered.  Risk OTC drugs.   This patient presents to the ED for concern of knee pain, this involves an extensive number of treatment options, and is a complaint that carries with it a high risk of complications and morbidity.  I considered the following differential and admission for this acute, potentially life threatening condition.  The differential diagnosis includes arthritis, less likely injury such as sprain or fracture, low suspicion  for septic joint.  MDM:    This is an 81 year old male who primarily presents with concern for knee pain.  Knee pain is chronic in nature.  No new injuries.  He is nontoxic and vital signs are reassuring.  No signs or symptoms of infection or deformity on exam.  X-ray shows evidence of arthritis.  He was given a gram of Tylenol.  We will refer him back to Dr. Aline Brochure.  Regarding his encopresis and fecal incontinence, this too has been ongoing for the patient for several years.  He has seen his primary physician for this.  He does not have any other complaints or related complaints.  Doubt acute emergent process.  I have referred him back to his primary physician for this.  (Labs, imaging, consults)  Labs: I Ordered, and personally interpreted labs.  The pertinent results include: None  Imaging Studies ordered: I ordered imaging studies including x-ray right knee I independently visualized and interpreted imaging. I agree with the radiologist interpretation  Additional history obtained from chart review.  External records from outside source obtained and reviewed including prior evaluations  Cardiac Monitoring: The patient was maintained on a cardiac monitor.  I personally viewed and interpreted the cardiac monitored which showed an underlying rhythm of: Sinus rhythm  Reevaluation: After the interventions noted above, I reevaluated the patient and found that they  have :stayed the same  Social Determinants of Health: Lives independently  Disposition: Discharge  Co morbidities that complicate the patient evaluation  Past Medical History:  Diagnosis Date   Allergic rhinitis    Arthritis    Asthma    Childhood   Atrial fibrillation ([place])    Remote history - WARCEF   Bladder cancer ([place])    BPH (benign prostatic hyperplasia)    Cardiomyopathy ([place])    CHF (congestive heart failure) ([place])    a. EF 30-35% by echo in 2018 and 10/2018, at 25-30% in 05/2020   Coronary atherosclerosis of native coronary artery    Multivessel s/p CABG in 2002 post-IMI, LVEF 35% up to 50% postoperatively;   Depression    Difficulty sleeping    Erectile dysfunction    Essential hypertension    Frequency of urination    History of bipolar disorder    Hyperlipidemia    IBS (irritable bowel syndrome)    Myocardial infarction ([place]) 2002     Medicines Meds ordered this encounter  Medications   acetaminophen (TYLENOL) tablet 1,000 mg    I have reviewed the patients home medicines and have made adjustments as needed  Problem List / ED Course: Problem List Items Addressed This Visit   None Visit Diagnoses     Chronic pain of right knee    -  Primary   Encopresis                       Final Clinical Impression(s) / ED Diagnoses Final diagnoses:  Chronic pain of right knee  Encopresis    Rx / DC Orders ED Discharge Orders     None         Merryl Hacker, MD 10/30/21 [id number]

## 2021-10-30 NOTE — ED Triage Notes (Signed)
Pt arrived from home via POV w c/o right knee pain 8/10 on pain scale.

## 2021-11-10 ENCOUNTER — Ambulatory Visit (INDEPENDENT_AMBULATORY_CARE_PROVIDER_SITE_OTHER): Payer: PPO

## 2021-11-10 DIAGNOSIS — I255 Ischemic cardiomyopathy: Secondary | ICD-10-CM | POA: Diagnosis not present

## 2021-11-11 ENCOUNTER — Other Ambulatory Visit (INDEPENDENT_AMBULATORY_CARE_PROVIDER_SITE_OTHER): Payer: Self-pay | Admitting: Gastroenterology

## 2021-11-11 LAB — CUP PACEART REMOTE DEVICE CHECK
Battery Remaining Longevity: 85 mo
Battery Remaining Percentage: 87 %
Battery Voltage: 3.01 V
Brady Statistic AP VP Percent: 45 %
Brady Statistic AP VS Percent: 1 %
Brady Statistic AS VP Percent: 49 %
Brady Statistic AS VS Percent: 5 %
Brady Statistic RA Percent Paced: 45 %
Brady Statistic RV Percent Paced: 94 %
Date Time Interrogation Session: 20231102025114
Implantable Lead Connection Status: 753985
Implantable Lead Connection Status: 753985
Implantable Lead Implant Date: 20220503
Implantable Lead Implant Date: 20220503
Implantable Lead Location: 753859
Implantable Lead Location: 753860
Implantable Pulse Generator Implant Date: 20220503
Lead Channel Impedance Value: 360 Ohm
Lead Channel Impedance Value: 410 Ohm
Lead Channel Pacing Threshold Amplitude: 0.75 V
Lead Channel Pacing Threshold Amplitude: 0.75 V
Lead Channel Pacing Threshold Pulse Width: 0.5 ms
Lead Channel Pacing Threshold Pulse Width: 0.5 ms
Lead Channel Sensing Intrinsic Amplitude: 2.7 mV
Lead Channel Sensing Intrinsic Amplitude: 6.5 mV
Lead Channel Setting Pacing Amplitude: 2 V
Lead Channel Setting Pacing Amplitude: 2.5 V
Lead Channel Setting Pacing Pulse Width: 0.5 ms
Lead Channel Setting Sensing Sensitivity: 2 mV
Pulse Gen Model: 2272
Pulse Gen Serial Number: 3920512

## 2021-11-15 ENCOUNTER — Ambulatory Visit (INDEPENDENT_AMBULATORY_CARE_PROVIDER_SITE_OTHER): Payer: PPO | Admitting: Orthopedic Surgery

## 2021-11-15 DIAGNOSIS — M1711 Unilateral primary osteoarthritis, right knee: Secondary | ICD-10-CM | POA: Diagnosis not present

## 2021-11-15 MED ORDER — METHYLPREDNISOLONE ACETATE 40 MG/ML IJ SUSP
40.0000 mg | INTRAMUSCULAR | Status: AC | PRN
  Administered 2021-11-15: 40 mg via INTRA_ARTICULAR

## 2021-11-15 MED ORDER — LIDOCAINE HCL 1 % IJ SOLN
5.0000 mL | INTRAMUSCULAR | Status: AC | PRN
  Administered 2021-11-15: 5 mL

## 2021-11-15 NOTE — Patient Instructions (Signed)

## 2021-11-15 NOTE — Progress Notes (Signed)
Chief Complaint  Patient presents with   Right Knee - Pain    Pain for a few months but I am very active   [name] cannot have surgery cardiology never cleared him  He is interested in repeat injection    Procedure Note  Patient: Grant Stevens             Date of Birth: Jan 24, 1940           MRN: [id number]             Visit Date: 11/15/2021  Procedures: Visit Diagnoses: No diagnosis found.  Large Joint Inj: R knee on 11/15/2021 4:59 PM Indications: pain Details: (20) 1.5 in needle, anterolateral approach  Arthrogram: No  Medications: 5 mL lidocaine 1 %; 40 mg methylPREDNISolone acetate 40 MG/ML Outcome: tolerated well, no immediate complications Procedure, treatment alternatives, risks and benefits explained, specific risks discussed. Consent was given by the patient. Immediately prior to procedure a time out was called to verify the correct patient, procedure, equipment, support staff and site/side marked as required. Patient was prepped and draped in the usual sterile fashion.

## 2021-11-22 ENCOUNTER — Other Ambulatory Visit (INDEPENDENT_AMBULATORY_CARE_PROVIDER_SITE_OTHER): Payer: Self-pay | Admitting: Gastroenterology

## 2021-11-23 NOTE — Progress Notes (Signed)
Remote pacemaker transmission.   

## 2021-11-28 ENCOUNTER — Encounter (INDEPENDENT_AMBULATORY_CARE_PROVIDER_SITE_OTHER): Payer: Self-pay | Admitting: Gastroenterology

## 2021-12-01 ENCOUNTER — Other Ambulatory Visit: Payer: Self-pay | Admitting: Cardiology

## 2021-12-07 ENCOUNTER — Encounter: Payer: Self-pay | Admitting: Internal Medicine

## 2021-12-07 ENCOUNTER — Ambulatory Visit: Payer: PPO | Attending: Internal Medicine | Admitting: Internal Medicine

## 2021-12-07 VITALS — BP 116/60 | HR 64 | Ht 72.0 in | Wt 208.0 lb

## 2021-12-07 DIAGNOSIS — Z950 Presence of cardiac pacemaker: Secondary | ICD-10-CM

## 2021-12-07 DIAGNOSIS — I442 Atrioventricular block, complete: Secondary | ICD-10-CM | POA: Diagnosis not present

## 2021-12-07 NOTE — Progress Notes (Signed)
HPI Mr. Holaway returns today for followup. He is a pleasant 81 yo man with a h/o high grade heart block who underwent DDD PM insertion. He could not implant an LV lead due to his antaomy. He has done well in the interim with no chest pain or sob. He is exercising with no limit. He denies peripheral edema.   Allergies  Allergen Reactions   Spironolactone Other (See Comments)    Hyperkalemia     Current Outpatient Medications  Medication Sig Dispense Refill   aspirin EC 81 MG tablet Take 1 tablet (81 mg total) by mouth daily.     atorvastatin (LIPITOR) 80 MG tablet Take 80 mg by mouth at bedtime.       bisoprolol (ZEBETA) 5 MG tablet Take 0.5 tablets (2.5 mg total) by mouth daily. 45 tablet 3   carbamazepine (TEGRETOL) 200 MG tablet Take 400 mg by mouth 2 (two) times daily.      divalproex (DEPAKOTE ER) 250 MG 24 hr tablet Take 250 mg by mouth daily.     doxycycline (VIBRAMYCIN) 100 MG capsule Take 1 capsule (100 mg total) by mouth 2 (two) times daily. 20 capsule 0   empagliflozin (JARDIANCE) 10 MG TABS tablet Take 1 tablet (10 mg total) by mouth daily before breakfast. 30 tablet 11   EPINEPHrine 0.3 mg/0.3 mL IJ SOAJ injection Inject 0.3 mg into the muscle as needed for anaphylaxis. 1 each 0   famotidine (PEPCID) 40 MG tablet TAKE (1) TABLET BY MOUTH AT BEDTIME. 90 tablet 0   furosemide (LASIX) 40 MG tablet Take 0.5-1 tablets (20-40 mg total) by mouth daily. '20mg'$  one day then '40mg'$  the other day alternating '20mg'$ -'40mg'$  (Patient taking differently: Take 40 mg by mouth daily. Take one tablet daily)     loperamide (IMODIUM) 2 MG capsule Take 1 capsule (2 mg total) by mouth every 6 (six) hours as needed for diarrhea or loose stools. 30 capsule 0   LORazepam (ATIVAN) 0.5 MG tablet Take 0.5 mg by mouth at bedtime.     pantoprazole (PROTONIX) 40 MG tablet Take 1 tablet (40 mg total) by mouth daily before breakfast. 90 tablet 0   sacubitril-valsartan (ENTRESTO) 24-26 MG TAKE (1) TABLET BY  MOUTH TWICE DAILY. 60 tablet 3   silodosin (RAPAFLO) 8 MG CAPS capsule Take 1 capsule (8 mg total) by mouth daily with breakfast. 30 capsule 11   tamsulosin (FLOMAX) 0.4 MG CAPS capsule Take 1 capsule (0.4 mg total) by mouth at bedtime. 90 capsule 3   temazepam (RESTORIL) 15 MG capsule Take 15 mg by mouth at bedtime as needed for sleep.     vitamin B-12 (CYANOCOBALAMIN) 1000 MCG tablet Take 1 tablet (1,000 mcg total) by mouth daily. 30 tablet 0   No current facility-administered medications for this visit.     Past Medical History:  Diagnosis Date   Allergic rhinitis    Arthritis    Asthma    Childhood   Atrial fibrillation ([place])    Remote history - WARCEF   Bladder cancer ([place])    BPH (benign prostatic hyperplasia)    Cardiomyopathy ([facility])    CHF (congestive heart failure) ([place])    a. EF 30-35% by echo in 2018 and 10/2018, at 25-30% in 05/2020   Coronary atherosclerosis of native coronary artery    Multivessel s/p CABG in 2002 post-IMI, LVEF 35% up to 50% postoperatively;   Depression    Difficulty sleeping    Erectile dysfunction  Essential hypertension    Frequency of urination    History of bipolar disorder    Hyperlipidemia    IBS (irritable bowel syndrome)    Myocardial infarction ([place]) 2002    ROS:   All systems reviewed and negative except as noted in the HPI.   Past Surgical History:  Procedure Laterality Date   BIOPSY  03/28/2018   Procedure: BIOPSY;  Surgeon: Rogene Houston, MD;  [facility];  Service: Endoscopy;;  esophagus   BIOPSY  03/09/2021   Procedure: BIOPSY;  Surgeon: Harvel Quale, MD;  [facility];  Service: Gastroenterology;;   BIV PACEMAKER INSERTION CRT-P N/A 05/12/2020   Procedure: BIV PACEMAKER INSERTION CRT-P;  Surgeon: Thompson Grayer, MD;  Location: [place] [facility];  Service: Cardiovascular;  Laterality: N/A;   CATARACT EXTRACTION W/PHACO Left 12/29/2014   Procedure: CATARACT EXTRACTION PHACO AND  INTRAOCULAR LENS PLACEMENT (IOC);  Surgeon: Williams Che, MD;  Location: [facility];  Service: Ophthalmology;  Laterality: Left;  CDE:3.71   CATARACT EXTRACTION W/PHACO Right 03/30/2015   Procedure: CATARACT EXTRACTION PHACO AND INTRAOCULAR LENS PLACEMENT RIGHT EYE CDE=2.56;  Surgeon: Williams Che, MD;  Location: [facility];  Service: Ophthalmology;  Laterality: Right;   CIRCUMCISION  10/11/2003   COLONOSCOPY N/A 07/11/2012   rehman,transverse colon polyp (benign lymphoid); tortuous colon; prep adequate with much suction/lavage; hemorrhoids.   CORONARY ARTERY BYPASS GRAFT  08/10/2000   Dr. Servando Snare - LIMA to LAD, SVG to diagonal, SVG to PDA TRIPLE BYPASS   CYSTOSCOPY W/ RETROGRADES Bilateral 03/02/2015   Procedure: CYSTOSCOPY WITH RIGHT RETROGRADE PYELOGRAM,  ATTEMPTED LEFT RETROGRADE PYELOGRAM;  Surgeon: Cleon Gustin, MD;  Location[facility];  Service: Urology;  Laterality: Bilateral;   ESOPHAGEAL DILATION N/A 03/28/2018   Procedure: ESOPHAGEAL DILATION;  Surgeon: Rogene Houston, MD;  [facility];  Service: Endoscopy;  Laterality: N/A;   ESOPHAGOGASTRODUODENOSCOPY N/A 03/28/2018   rehman,Abnormal esophageal motility. mild schatzki ring at GEJ, dilated, patch of salmon colored mucosa at distal esophagus. Barrett's esophagus. 2cm HH. erosive gastropathy, normal pylorus, duodenal erosions w/o bleeding. normal second portion of duodenum   ESOPHAGOGASTRODUODENOSCOPY (EGD) WITH PROPOFOL N/A 03/09/2021   Procedure: ESOPHAGOGASTRODUODENOSCOPY (EGD) WITH PROPOFOL;  Surgeon: Harvel Quale, MD;  [facility];  Service: Gastroenterology;  Laterality: N/A;  945   TEMPORARY PACEMAKER N/A 05/12/2020   Procedure: TEMPORARY PACEMAKER;  Surgeon: Martinique, Peter M, MD;  Location: [place] [facility];  Service: Cardiovascular;  Laterality: N/A;   TONSILLECTOMY     TRANSURETHRAL RESECTION OF BLADDER TUMOR N/A 01/26/2015   Procedure: TRANSURETHRAL RESECTION OF BLADDER TUMOR  (TURBT);  Surgeon: Cleon Gustin, MD;  [facility];  Service: Urology;  Laterality: N/A;   TRANSURETHRAL RESECTION OF BLADDER TUMOR WITH GYRUS (TURBT-GYRUS) N/A 03/02/2015   Procedure: TRANSURETHRAL RESECTION OF BLADDER TUMOR WITH GYRUS (TURBT-GYRUS);  Surgeon: Cleon Gustin, MD;  [facility];  Service: Urology;  Laterality: N/A;   TRANSURETHRAL RESECTION OF PROSTATE N/A 01/26/2015   Procedure: TRANSURETHRAL RESECTION OF THE PROSTATE WITH GYRUS INSTRUMENTS;  Surgeon: Cleon Gustin, MD;  [facility];  Service: Urology;  Laterality: N/A;     Family History  Problem Relation Age of Onset   Asthma Mother    Heart attack Father 46   Diabetes Brother      Social History   Socioeconomic History   Marital status: Widowed    Spouse name: Not on file   Number of children: Not  on file   Years of education: Not on file   Highest education level: Not on file  Occupational History   Not on file  Tobacco Use   Smoking status: Former    Packs/day: 1.00    Years: 15.00    Total pack years: 15.00    Types: Cigarettes    Quit date: 01/11/1971    Years since quitting: 50.9    Passive exposure: Past   Smokeless tobacco: Never  Vaping Use   Vaping Use: Never used  Substance and Sexual Activity   Alcohol use: Not Currently    Comment: occ.   Drug use: No   Sexual activity: Not Currently  Other Topics Concern   Not on file  Social History Narrative   Married with no children    Exercises 4 times weekly   Caffeine once a day   Social Determinants of Health   Financial Resource Strain: Not on file  Food Insecurity: Not on file  Transportation Needs: Not on file  Physical Activity: Not on file  Stress: Not on file  Social Connections: Not on file  Intimate Partner Violence: Not on file     BP 116/60 (BP Location: Left Arm, Patient Position: Sitting, Cuff Size: Normal)   Pulse 64   Ht 6' (1.829 m)   Wt 208 lb (94.3 kg)   BMI 28.21 kg/m   Physical  Exam:  Well appearing NAD HEENT: Unremarkable Neck:  No JVD, no thyromegally Lymphatics:  No adenopathy Back:  No CVA tenderness Lungs:  Clear with no wheezes HEART:  Regular rate rhythm, no murmurs, no rubs, no clicks Abd:  soft, positive bowel sounds, no organomegally, no rebound, no guarding Ext:  2 plus pulses, no edema, no cyanosis, no clubbing Skin:  No rashes no nodules Neuro:  CN II through XII intact, motor grossly intact   DEVICE  Normal device function.  See PaceArt for details.   Assess/Plan:  Heart block - he is conducting today and I have reprogrammed his device to allow for intrinsic conduction. PPM - we reprogrammed his device to DDD with a long AV delay allowing him for intrinsic conduction.  HTN - his bp is well controlled. No change in meds. Chronic systolic heart failure - his symptoms are class 2A. We will follow.   Carleene Overlie Marianita Botkin,MD

## 2021-12-07 NOTE — Patient Instructions (Signed)
Medication Instructions:  Your physician recommends that you continue on your current medications as directed. Please refer to the Current Medication list given to you today.  *If you need a refill on your cardiac medications before your next appointment, please call your pharmacy*   Lab Work: NONE   If you have labs (blood work) drawn today and your tests are completely normal, you will receive your results only by: MyChart Message (if you have MyChart) OR A paper copy in the mail If you have any lab test that is abnormal or we need to change your treatment, we will call you to review the results.   Testing/Procedures: NONE    Follow-Up: At [facility], you and your health needs are our priority.  As part of our continuing mission to provide you with exceptional heart care, we have created designated Provider Care Teams.  These Care Teams include your primary Cardiologist (physician) and Advanced Practice Providers (APPs -  Physician Assistants and Nurse Practitioners) who all work together to provide you with the care you need, when you need it.  We recommend signing up for the patient portal called "MyChart".  Sign up information is provided on this After Visit Summary.  MyChart is used to connect with patients for Virtual Visits (Telemedicine).  Patients are able to view lab/test results, encounter notes, upcoming appointments, etc.  Non-urgent messages can be sent to your provider as well.   To learn more about what you can do with MyChart, go to [email or web address].    Your next appointment:   1 year(s)  The format for your next appointment:   In Person  Provider:   Gregg Taylor, MD    Other Instructions Thank you for choosing [facility]!    Important Information About Sugar       

## 2021-12-13 ENCOUNTER — Telehealth: Payer: Self-pay | Admitting: Cardiology

## 2021-12-13 NOTE — Telephone Encounter (Signed)
Patient returning call.

## 2021-12-13 NOTE — Telephone Encounter (Signed)
Both [place] and [place] are Tier 3 on his insurance. Cost will be the same for both. He may be in the coverage gap which will reset in Jan. However, if he does not have patient assistance for [facility], he will be quickly back in the coverage gap next year.

## 2021-12-13 NOTE — Telephone Encounter (Signed)
Patient would like Entresto application mailed to his home to help with coverage gap for 2024. Office does not have Jardiance samples at this time.

## 2021-12-13 NOTE — Telephone Encounter (Deleted)
Left a message for patient to call office back regarding medication.   Farxiga 10 mg tablets: Lot #:[id number] Exp: 06/09/2024

## 2021-12-13 NOTE — Telephone Encounter (Signed)
Pt came in office and stated that the Jardiance he is on is too expensive, he is wanting to know if there is a generic or something cheaper he could take.

## 2021-12-14 MED ORDER — EMPAGLIFLOZIN 10 MG PO TABS: 10.0000 mg | ORAL_TABLET | Freq: Every day | ORAL | 0 refills | Status: DC

## 2021-12-14 NOTE — Telephone Encounter (Signed)
Left a message for pt that the [facility] office has samples of Jardiance for him.

## 2021-12-14 NOTE — Addendum Note (Signed)
Addended by: Merlene Laughter on: 12/14/2021 10:14 AM   Modules accepted: Orders

## 2021-12-16 ENCOUNTER — Encounter (INDEPENDENT_AMBULATORY_CARE_PROVIDER_SITE_OTHER): Payer: Self-pay | Admitting: Gastroenterology

## 2021-12-16 ENCOUNTER — Ambulatory Visit (INDEPENDENT_AMBULATORY_CARE_PROVIDER_SITE_OTHER): Payer: PPO | Admitting: Gastroenterology

## 2021-12-16 VITALS — BP 95/61 | HR 61 | Temp 97.9°F | Ht 72.0 in | Wt 213.9 lb

## 2021-12-16 DIAGNOSIS — R1310 Dysphagia, unspecified: Secondary | ICD-10-CM

## 2021-12-16 DIAGNOSIS — K219 Gastro-esophageal reflux disease without esophagitis: Secondary | ICD-10-CM

## 2021-12-16 MED ORDER — PANTOPRAZOLE SODIUM 40 MG PO TBEC: 40.0000 mg | DELAYED_RELEASE_TABLET | Freq: Every day | ORAL | 3 refills | Status: DC

## 2021-12-16 NOTE — Patient Instructions (Signed)
Please continue protonix '40mg'$  once daily in the morning prior to eating Make sure you are avoiding drier, thicker foods, taking small bites, chewing well and taking sips of liquids between bites to avoid choking We will get [facility] referred [facility] for [facility] evaluation with esophageal manometry as recent EGD did not explain reason for your swallowing issues  Follow up 3 months

## 2021-12-16 NOTE — Progress Notes (Addendum)
Referring Provider: [name], MD Primary Care Physician:  [name], MD Primary GI Physician: Jenetta Downer   Chief Complaint  Patient presents with   Dysphagia    Trouble swallowing for awhile. Has to be careful with meat. Also has to take imodium every 6 hours for diarrhea.    HPI:   Grant Stevens is a 81 y.o. male with past medical history of  localized bladder cancer s/p BCG injection, MI s/p CABG, asthma, BPH, cardiomyopathy, Heart failure, HTN, bipolar disorder, HLD, Barrett's esophagus, and IBS   Patient presenting today for dysphagia.  Last seen in June 2023, at that time, having trouble swallowing for months. He states that he saw Dr. Laural Golden in the past and wants him to do an EGD on him. He is having dysphagia quite often, states he will have issues with choking when he goes out to eat. Usually occurs with meat.  Has been told by his lady friend that he needs to eat slower.  Having issues maybe 5-6x/week. Pills go down well as he usually eats apple sauce or puts it in a banana. He denies any heartburn or acid regurgitation.  He does note that his voice has changed, feels more hoarse, started maybe 6 months ago. If he eats slower and chews thoroughly he does not have any issues with choking. He has had to cough foods back up on occasion. Notably, he had abnormal esophageal motility noted on EGD in 2020. No current PPI therapy, he denies specific heartburn or acid regurgitation.  Recommended chewing precautions, continue Pepcid QHS, start protonix '40mg'$  daily, reflux precautions and consideration of esophageal manometry if symptoms not improved with PPI   Present:  Patient states he is continuing to have dysphagia, feels it may be worse than prior. Having to eat very slow, especially with meats. Issues occurring 2-3x/day. Sometimes has issues with pills as well. No issues with swallowing liquids. Sometimes substances can come right back up after he swallows, then will swallow again and  food or pills will go down. Sometimes foods will sit in his mid to lower throat. Denies sore throat. Has a cough on occasion. Feels hoarse at time as well. Denies GERD symptoms. He is taking protonix but not everyday compliantly. Does not think he is taking pepcid in the evening.   Patient denies melena, hematochezia, nausea, vomiting, diarrhea, constipation, odynophagia, early satiety or weight loss.   Last EGD:03/09/21- Esophageal mucosal changes consistent with short-segment Barrett's esophagus. Biopsied-1 cm w/o dysplasia - 2 cm hiatal hernia. - Discolored mucosa in the antrum. Biopsied-normal - Normal examined duodenum. Last Colonoscopy:2014 Examination performed to cecum. Quality of prep somewhat compromised examination of ascending colon and cecum. Redundant colon. Small polyp ablated via cold biopsy from proximal transverse colon - benign lymph node. Small external hemorrhoids   Past Medical History:  Diagnosis Date   Allergic rhinitis    Arthritis    Asthma    Childhood   Atrial fibrillation ([place])    Remote history - WARCEF   Bladder cancer (HCC)    BPH (benign prostatic hyperplasia)    Cardiomyopathy (HCC)    CHF (congestive heart failure) ([place])    a. EF 30-35% by echo in 2018 and 10/2018, at 25-30% in 05/2020   Coronary atherosclerosis of native coronary artery    Multivessel s/p CABG in 2002 post-IMI, LVEF 35% up to 50% postoperatively;   Depression    Difficulty sleeping    Erectile dysfunction    Essential hypertension  Frequency of urination    History of bipolar disorder    Hyperlipidemia    IBS (irritable bowel syndrome)    Myocardial infarction [facility]) 2002    Past Surgical History:  Procedure Laterality Date   BIOPSY  03/28/2018   Procedure: BIOPSY;  Surgeon: Rogene Houston, MD;  [facility];  Service: Endoscopy;;  esophagus   BIOPSY  03/09/2021   Procedure: BIOPSY;  Surgeon: Harvel Quale, MD;  [facility];   Service: Gastroenterology;;   BIV PACEMAKER INSERTION CRT-P N/A 05/12/2020   Procedure: BIV PACEMAKER INSERTION CRT-P;  Surgeon: Thompson Grayer, MD;  Location: [place] [facility];  Service: Cardiovascular;  Laterality: N/A;   CATARACT EXTRACTION W/PHACO Left 12/29/2014   Procedure: CATARACT EXTRACTION PHACO AND INTRAOCULAR LENS PLACEMENT (IOC);  Surgeon: Williams Che, MD;  Location: [facility];  Service: Ophthalmology;  Laterality: Left;  CDE:3.71   CATARACT EXTRACTION W/PHACO Right 03/30/2015   Procedure: CATARACT EXTRACTION PHACO AND INTRAOCULAR LENS PLACEMENT RIGHT EYE CDE=2.56;  Surgeon: Williams Che, MD;  Location: [facility];  Service: Ophthalmology;  Laterality: Right;   CIRCUMCISION  10/11/2003   COLONOSCOPY N/A 07/11/2012   rehman,transverse colon polyp (benign lymphoid); tortuous colon; prep adequate with much suction/lavage; hemorrhoids.   CORONARY ARTERY BYPASS GRAFT  08/10/2000   Dr. Servando Snare - LIMA to LAD, SVG to diagonal, SVG to PDA TRIPLE BYPASS   CYSTOSCOPY W/ RETROGRADES Bilateral 03/02/2015   Procedure: CYSTOSCOPY WITH RIGHT RETROGRADE PYELOGRAM,  ATTEMPTED LEFT RETROGRADE PYELOGRAM;  Surgeon: Cleon Gustin, MD;  [facility]: [facility];  Service: Urology;  Laterality: Bilateral;   ESOPHAGEAL DILATION N/A 03/28/2018   Procedure: ESOPHAGEAL DILATION;  Surgeon: Rogene Houston, MD;  [facility];  Service: Endoscopy;  Laterality: N/A;   ESOPHAGOGASTRODUODENOSCOPY N/A 03/28/2018   rehman,Abnormal esophageal motility. mild schatzki ring at GEJ, dilated, patch of salmon colored mucosa at distal esophagus. Barrett's esophagus. 2cm HH. erosive gastropathy, normal pylorus, duodenal erosions w/o bleeding. normal second portion of duodenum   ESOPHAGOGASTRODUODENOSCOPY (EGD) WITH PROPOFOL N/A 03/09/2021   Procedure: ESOPHAGOGASTRODUODENOSCOPY (EGD) WITH PROPOFOL;  Surgeon: Harvel Quale, MD;  [facility];  Service: Gastroenterology;  Laterality: N/A;  945    TEMPORARY PACEMAKER N/A 05/12/2020   Procedure: TEMPORARY PACEMAKER;  Surgeon: Martinique, Peter M, MD;  Location: [place] [facility];  Service: Cardiovascular;  Laterality: N/A;   TONSILLECTOMY     TRANSURETHRAL RESECTION OF BLADDER TUMOR N/A 01/26/2015   Procedure: TRANSURETHRAL RESECTION OF BLADDER TUMOR (TURBT);  Surgeon: Cleon Gustin, MD;  [facility];  Service: Urology;  Laterality: N/A;   TRANSURETHRAL RESECTION OF BLADDER TUMOR WITH GYRUS (TURBT-GYRUS) N/A 03/02/2015   Procedure: TRANSURETHRAL RESECTION OF BLADDER TUMOR WITH GYRUS (TURBT-GYRUS);  Surgeon: Cleon Gustin, MD;  [facility];  Service: Urology;  Laterality: N/A;   TRANSURETHRAL RESECTION OF PROSTATE N/A 01/26/2015   Procedure: TRANSURETHRAL RESECTION OF THE PROSTATE WITH GYRUS INSTRUMENTS;  Surgeon: Cleon Gustin, MD;  [facility];  Service: Urology;  Laterality: N/A;    Current Outpatient Medications  Medication Sig Dispense Refill   aspirin EC 81 MG tablet Take 1 tablet (81 mg total) by mouth daily.     atorvastatin (LIPITOR) 80 MG tablet Take 80 mg by mouth at bedtime.       bisoprolol (ZEBETA) 5 MG tablet Take 0.5 tablets (2.5 mg total) by mouth daily. 45 tablet 3   carbamazepine (TEGRETOL) 200 MG tablet Take 400 mg by mouth  2 (two) times daily.      divalproex (DEPAKOTE ER) 250 MG 24 hr tablet Take 250 mg by mouth daily.     empagliflozin (JARDIANCE) 10 MG TABS tablet Take 1 tablet (10 mg total) by mouth daily before breakfast. 28 tablet 0   EPINEPHrine 0.3 mg/0.3 mL IJ SOAJ injection Inject 0.3 mg into the muscle as needed for anaphylaxis. 1 each 0   famotidine (PEPCID) 40 MG tablet TAKE (1) TABLET BY MOUTH AT BEDTIME. 90 tablet 0   furosemide (LASIX) 40 MG tablet Take 0.5-1 tablets (20-40 mg total) by mouth daily. '20mg'$  one day then '40mg'$  the other day alternating '20mg'$ -'40mg'$  (Patient taking differently: Take 40 mg by mouth daily. Take one tablet daily)     loperamide (IMODIUM) 2 MG capsule Take  1 capsule (2 mg total) by mouth every 6 (six) hours as needed for diarrhea or loose stools. 30 capsule 0   LORazepam (ATIVAN) 0.5 MG tablet Take 0.5 mg by mouth at bedtime.     pantoprazole (PROTONIX) 40 MG tablet Take 1 tablet (40 mg total) by mouth daily before breakfast. 90 tablet 0   sacubitril-valsartan (ENTRESTO) 24-26 MG TAKE (1) TABLET BY MOUTH TWICE DAILY. 60 tablet 3   silodosin (RAPAFLO) 8 MG CAPS capsule Take 1 capsule (8 mg total) by mouth daily with breakfast. 30 capsule 11   tamsulosin (FLOMAX) 0.4 MG CAPS capsule Take 1 capsule (0.4 mg total) by mouth at bedtime. 90 capsule 3   temazepam (RESTORIL) 15 MG capsule Take 15 mg by mouth at bedtime as needed for sleep.     vitamin B-12 (CYANOCOBALAMIN) 1000 MCG tablet Take 1 tablet (1,000 mcg total) by mouth daily. 30 tablet 0   No current facility-administered medications for this visit.   Allergies as of 12/16/2021 - Review Complete 12/16/2021  Allergen Reaction Noted   Spironolactone Other (See Comments) 10/07/2020   Family History  Problem Relation Age of Onset   Asthma Mother    Heart attack Father 28   Diabetes Brother     Social History   Socioeconomic History   Marital status: Widowed    Spouse name: Not on file   Number of children: Not on file   Years of education: Not on file   Highest education level: Not on file  Occupational History   Not on file  Tobacco Use   Smoking status: Former    Packs/day: 1.00    Years: 15.00    Total pack years: 15.00    Types: Cigarettes    Quit date: 01/11/1971    Years since quitting: 50.9    Passive exposure: Past   Smokeless tobacco: Never  Vaping Use   Vaping Use: Never used  Substance and Sexual Activity   Alcohol use: Not Currently    Comment: occ.   Drug use: No   Sexual activity: Not Currently  Other Topics Concern   Not on file  Social History Narrative   Married with no children    Exercises 4 times weekly   Caffeine once a day   Social Determinants  of Health   Financial Resource Strain: Not on file  Food Insecurity: Not on file  Transportation Needs: Not on file  Physical Activity: Not on file  Stress: Not on file  Social Connections: Not on file   Review of systems General: negative for malaise, night sweats, fever, chills, weight loss Neck: Negative for lumps, goiter, pain and significant neck swelling Resp: Negative for cough, wheezing, dyspnea  at rest CV: Negative for chest pain, leg swelling, palpitations, orthopnea GI: denies melena, hematochezia, nausea, vomiting, diarrhea, constipation, odynophagia, early satiety or unintentional weight loss. +dysphagia MSK: Negative for joint pain or swelling, back pain, and muscle pain. Derm: Negative for itching or rash Psych: Denies depression, anxiety, memory loss, confusion. No homicidal or suicidal ideation.  Heme: Negative for prolonged bleeding, bruising easily, and swollen nodes. Endocrine: Negative for cold or heat intolerance, polyuria, polydipsia and goiter. Neuro: negative for tremor, gait imbalance, syncope and seizures. The remainder of the review of systems is noncontributory.  Physical Exam: BP 95/61 (BP Location: Left Arm, Patient Position: Sitting, Cuff Size: Large)   Pulse 61   Temp 97.9 F (36.6 C) (Oral)   Ht 6' (1.829 m)   Wt 213 lb 14.4 oz (97 kg)   BMI 29.01 kg/m  General:   Alert and oriented. No distress noted. Pleasant and cooperative.  Head:  Normocephalic and atraumatic. Eyes:  Conjuctiva clear without scleral icterus. Mouth:  Oral mucosa pink and moist. Good dentition. No lesions. Heart: Normal rate and rhythm, s1 and s2 heart sounds present.  Lungs: Clear lung sounds in all lobes. Respirations equal and unlabored. Abdomen:  +BS, soft, non-tender and non-distended. No rebound or guarding. No HSM or masses noted. Derm: No palmar erythema or jaundice Msk:  Symmetrical without gross deformities. Normal posture. Extremities:  Without  edema. Neurologic:  Alert and  oriented x4 Psych:  Alert and cooperative. Normal mood and affect.  Invalid input(s): "6 MONTHS"   ASSESSMENT: LELDON STEEGE is a 81 y.o. male presenting today for follow up of dysphagia.  Continued dysphagia, maybe slightly worse than previous visit. He is taking protonix '40mg'$  daily, denies overt GERD symptoms. Last EGD in feb 2023 without findings to explain dysphagia. Recommend proceeding with esophageal manometry to rule out esophageal motility especially as there as suspicion for motility issues on EGD in 2020.  Encouraged patient to avoid drier, thicker foods, take small bites, chew well and take sips of liquids between bites to avoid choking.   PLAN:  Continue PPI daily, refill sent  2. Refer to [facility] for esophageal manometry  3. Chewing precautions   All questions were answered, patient verbalized understanding and is in agreement with plan as outlined above.   Follow Up: 3 months   Jaelon Gatley L. Alver Sorrow, MSN, APRN, AGNP-C Adult-Gerontology Nurse Practitioner [facility]  I have reviewed the note and agree with the APP's assessment as described in this progress note  [name], MD Gastroenterology and Hepatology [facility] Gastroenterology

## 2021-12-17 DIAGNOSIS — Z20822 Contact with and (suspected) exposure to covid-19: Secondary | ICD-10-CM | POA: Diagnosis not present

## 2021-12-29 ENCOUNTER — Ambulatory Visit: Payer: PPO | Admitting: Urology

## 2022-01-05 ENCOUNTER — Ambulatory Visit: Payer: PPO | Admitting: Urology

## 2022-01-08 ENCOUNTER — Other Ambulatory Visit: Payer: Self-pay | Admitting: Cardiology

## 2022-01-19 ENCOUNTER — Encounter: Payer: Self-pay | Admitting: Cardiology

## 2022-01-19 ENCOUNTER — Ambulatory Visit: Payer: Medicare HMO | Attending: Cardiology | Admitting: Cardiology

## 2022-01-19 VITALS — BP 130/84 | HR 66 | Ht 72.0 in | Wt 223.4 lb

## 2022-01-19 DIAGNOSIS — I25119 Atherosclerotic heart disease of native coronary artery with unspecified angina pectoris: Secondary | ICD-10-CM | POA: Diagnosis not present

## 2022-01-19 DIAGNOSIS — I255 Ischemic cardiomyopathy: Secondary | ICD-10-CM

## 2022-01-19 DIAGNOSIS — I5020 Unspecified systolic (congestive) heart failure: Secondary | ICD-10-CM | POA: Diagnosis not present

## 2022-01-19 MED ORDER — ATORVASTATIN CALCIUM 80 MG PO TABS: 80.0000 mg | ORAL_TABLET | Freq: Every day | ORAL | 3 refills | Status: DC

## 2022-01-19 NOTE — Patient Instructions (Signed)
Medication Instructions:  Your physician recommends that you continue on your current medications as directed. Please refer to the Current Medication list given to you today.   Labwork: None  Testing/Procedures: Your physician has requested that you have an echocardiogram. Echocardiography is a painless test that uses sound waves to create images of your heart. It provides your doctor with information about the size and shape of your heart and how well your heart's chambers and valves are working. This procedure takes approximately one hour. There are no restrictions for this procedure. Please do NOT wear cologne, perfume, aftershave, or lotions (deodorant is allowed). Please arrive 15 minutes prior to your appointment time.   Follow-Up: Follow up with Dr. McDowell in 6 months.   Any Other Special Instructions Will Be Listed Below (If Applicable).     If you need a refill on your cardiac medications before your next appointment, please call your pharmacy.  

## 2022-01-19 NOTE — Progress Notes (Signed)
Cardiology Office Note  Date: 01/19/2022   ID: Grant Stevens, DOB July 19, 1940, MRN [id number]  PCP:  [name], MD  Cardiologist:  Rozann Lesches, MD Electrophysiologist:  None   Chief Complaint  Patient presents with   Cardiac follow-up    History of Present Illness: Grant Stevens is an 82 y.o. male who last seen in August 2023 by Ms. Strader PA-C, I reviewed the note.  He is here for a follow-up visit.  States that he has been doing better overall.  NYHA class II dyspnea, no angina, no palpitations or syncope.  He has a St. Jude pacemaker in place with follow-up by Dr. Lovena Le.  Last device interrogation revealed normal function.  We went over his medications today.  Discussed resuming Lipitor which had fallen off his list somehow.  Otherwise tolerating Jardiance, bisoprolol, Entresto, and Lasix.  He is not on Aldactone given history of hyperkalemia.  His last echocardiogram was in May 2022, we will plan on an updated study for reevaluation of LVEF.  Past Medical History:  Diagnosis Date   Allergic rhinitis    Arthritis    Asthma    Childhood   Atrial fibrillation ([place])    Remote history - WARCEF   Bladder cancer ([place])    BPH (benign prostatic hyperplasia)    Cardiomyopathy (HCC)    CHF (congestive heart failure) ([place])    a. EF 30-35% by echo in 2018 and 10/2018, at 25-30% in 05/2020   Coronary atherosclerosis of native coronary artery    Multivessel s/p CABG in 2002 post-IMI, LVEF 35% up to 50% postoperatively;   Depression    Difficulty sleeping    Erectile dysfunction    Essential hypertension    Frequency of urination    History of bipolar disorder    Hyperlipidemia    IBS (irritable bowel syndrome)    Myocardial infarction ([place]) 2002    Current Outpatient Medications  Medication Sig Dispense Refill   aspirin EC 81 MG tablet Take 1 tablet (81 mg total) by mouth daily.     bisoprolol (ZEBETA) 5 MG tablet Take 0.5 tablets (2.5 mg total) by mouth daily.  45 tablet 0   carbamazepine (TEGRETOL) 200 MG tablet Take 400 mg by mouth 2 (two) times daily.      divalproex (DEPAKOTE ER) 250 MG 24 hr tablet Take 250 mg by mouth daily.     empagliflozin (JARDIANCE) 10 MG TABS tablet Take 1 tablet (10 mg total) by mouth daily before breakfast. 28 tablet 0   EPINEPHrine 0.3 mg/0.3 mL IJ SOAJ injection Inject 0.3 mg into the muscle as needed for anaphylaxis. 1 each 0   famotidine (PEPCID) 40 MG tablet TAKE (1) TABLET BY MOUTH AT BEDTIME. 90 tablet 0   furosemide (LASIX) 40 MG tablet Take 0.5-1 tablets (20-40 mg total) by mouth daily. '20mg'$  one day then '40mg'$  the other day alternating '20mg'$ -'40mg'$  (Patient taking differently: Take 40 mg by mouth daily. Take one tablet daily)     loperamide (IMODIUM) 2 MG capsule Take 1 capsule (2 mg total) by mouth every 6 (six) hours as needed for diarrhea or loose stools. 30 capsule 0   sacubitril-valsartan (ENTRESTO) 24-26 MG TAKE (1) TABLET BY MOUTH TWICE DAILY. 60 tablet 3   silodosin (RAPAFLO) 8 MG CAPS capsule Take 1 capsule (8 mg total) by mouth daily with breakfast. 30 capsule 11   vitamin B-12 (CYANOCOBALAMIN) 1000 MCG tablet Take 1 tablet (1,000 mcg total) by mouth daily. 30 tablet 0  atorvastatin (LIPITOR) 80 MG tablet Take 1 tablet (80 mg total) by mouth at bedtime. 90 tablet 3   LORazepam (ATIVAN) 0.5 MG tablet Take 0.5 mg by mouth at bedtime. (Patient not taking: Reported on 01/19/2022)     pantoprazole (PROTONIX) 40 MG tablet Take 1 tablet (40 mg total) by mouth daily before breakfast. (Patient not taking: Reported on 01/19/2022) 90 tablet 3   tamsulosin (FLOMAX) 0.4 MG CAPS capsule Take 1 capsule (0.4 mg total) by mouth at bedtime. (Patient not taking: Reported on 01/19/2022) 90 capsule 3   temazepam (RESTORIL) 15 MG capsule Take 15 mg by mouth at bedtime as needed for sleep. (Patient not taking: Reported on 01/19/2022)     No current facility-administered medications for this visit.   Allergies:  Spironolactone   ROS:  No syncope.  Mild ankle edema.  Physical Exam: VS:  BP 130/84   Pulse 66   Ht 6' (1.829 m)   Wt 223 lb 6.4 oz (101.3 kg)   SpO2 96%   BMI 30.30 kg/m , BMI Body mass index is 30.3 kg/m.  Wt Readings from Last 3 Encounters:  01/19/22 223 lb 6.4 oz (101.3 kg)  12/16/21 213 lb 14.4 oz (97 kg)  12/07/21 208 lb (94.3 kg)    General: Patient appears comfortable at rest. HEENT: Conjunctiva and lids normal. Neck: Supple, no elevated JVP or carotid bruits. Lungs: Clear to auscultation, nonlabored breathing at rest. Cardiac: Regular rate and rhythm, no S3, 1/6 systolic murmur. Extremities: Mild ankle edema..  ECG:  An ECG dated 07/15/2021 was personally reviewed today and demonstrated:  Sinus rhythm with ventricular pacing.  Recent Labwork: January 2023: Cholesterol 172, triglycerides 66, HDL 55, LDL 104 07/15/2021: TSH 2.741 07/16/2021: ALT 20; AST 29; BUN 17; Creatinine, Ser 0.78; Hemoglobin 10.8; Magnesium 1.8; Platelets 107; Potassium 3.3; Sodium 136   Other Studies Reviewed Today:  Echocardiogram 05/12/2020:  1. Left ventricular ejection fraction, by estimation, is 25 to 30%. The  left ventricle has severely decreased function. The left ventricle  demonstrates global hypokinesis. There is moderate left ventricular  hypertrophy.   2. Right ventricular systolic function is normal. The right ventricular  size is mildly enlarged. There is normal pulmonary artery systolic  pressure. The estimated right ventricular systolic pressure is 92.4 mmHg.   3. Left atrial size was moderately dilated.   4. Right atrial size was mildly dilated.   5. The mitral valve is degenerative. Mild mitral valve regurgitation. No  evidence of mitral stenosis. Moderate mitral annular calcification.   6. The aortic valve is tricuspid. Aortic valve regurgitation is mild.  Mild to moderate aortic valve sclerosis/calcification is present, without  any evidence of aortic stenosis.   7. The inferior vena cava is  normal in size with greater than 50%  respiratory variability, suggesting right atrial pressure of 3 mmHg.   Assessment and Plan:  1.  HFrEF with ischemic cardiomyopathy and LVEF 25 to 30% as of May 2022.  Medications have been adjusted over time in terms of GDMT.  He is currently on Jardiance, bisoprolol, Entresto, and Lasix.  No Aldactone with history of hyperkalemia.  Plan to obtain follow-up echocardiogram.  Symptomatically, he reports NYHA class II dyspnea at this point.  2.  CAD status post CABG in 2002.  No active angina at this time.  He is on aspirin and we will plan to resume Lipitor 80 mg daily.  3.  Conduction system disease status post St. Jude pacemaker with follow-up by Dr. Lovena Le.  Medication Adjustments/Labs and Tests Ordered: Current medicines are reviewed at length with the patient today.  Concerns regarding medicines are outlined above.   Tests Ordered: Orders Placed This Encounter  Procedures   ECHOCARDIOGRAM COMPLETE    Medication Changes: Meds ordered this encounter  Medications   DISCONTD: atorvastatin (LIPITOR) 80 MG tablet    Sig: Take 1 tablet (80 mg total) by mouth at bedtime.    Dispense:  90 tablet    Refill:  3   atorvastatin (LIPITOR) 80 MG tablet    Sig: Take 1 tablet (80 mg total) by mouth at bedtime.    Dispense:  90 tablet    Refill:  3    Disposition:  Follow up  6 months.  Signed, [name], MD, [facility] 01/19/2022 4:08 PM    [facility] [address] [place], [place] [address] Phone: [phone]; Fax: [phone]

## 2022-01-28 ENCOUNTER — Encounter: Payer: Self-pay | Admitting: Urology

## 2022-01-28 ENCOUNTER — Telehealth: Payer: Self-pay | Admitting: Cardiology

## 2022-01-28 ENCOUNTER — Ambulatory Visit: Payer: Medicare HMO | Admitting: Urology

## 2022-01-28 VITALS — BP 106/75 | HR 103

## 2022-01-28 DIAGNOSIS — N401 Enlarged prostate with lower urinary tract symptoms: Secondary | ICD-10-CM

## 2022-01-28 DIAGNOSIS — R339 Retention of urine, unspecified: Secondary | ICD-10-CM

## 2022-01-28 DIAGNOSIS — N138 Other obstructive and reflux uropathy: Secondary | ICD-10-CM | POA: Diagnosis not present

## 2022-01-28 LAB — URINALYSIS, ROUTINE W REFLEX MICROSCOPIC
Bilirubin, UA: NEGATIVE
Glucose, UA: NEGATIVE
Ketones, UA: NEGATIVE
Leukocytes,UA: NEGATIVE
Nitrite, UA: NEGATIVE
Protein,UA: NEGATIVE
Specific Gravity, UA: 1.01 (ref 1.005–1.030)
Urobilinogen, Ur: 0.2 mg/dL (ref 0.2–1.0)
pH, UA: 5 (ref 5.0–7.5)

## 2022-01-28 LAB — MICROSCOPIC EXAMINATION
Bacteria, UA: NONE SEEN
WBC, UA: NONE SEEN /hpf (ref 0–5)

## 2022-01-28 MED ORDER — FUROSEMIDE 40 MG PO TABS: 20.0000 mg | ORAL_TABLET | Freq: Every day | ORAL | 3 refills | Status: DC

## 2022-01-28 MED ORDER — SILODOSIN 8 MG PO CAPS: 8.0000 mg | ORAL_CAPSULE | Freq: Every day | ORAL | 11 refills | Status: DC

## 2022-01-28 NOTE — Telephone Encounter (Signed)
Patient currently not taking Lisinopril. Furosemide refilled and sent to [facility] per pt's request.

## 2022-01-28 NOTE — Telephone Encounter (Signed)
*  STAT* If patient is at the pharmacy, call can be transferred to refill team.   1. Which medications need to be refilled? (please list name of each medication and dose if known)  furosemide (LASIX) 40 MG tablet Lisinopril   2. Which pharmacy/location (including street and city if local pharmacy) is medication to be sent to? [id number] - [place], [place]  3. Do they need a 30 day or 90 day supply?  90 day supply

## 2022-01-28 NOTE — Patient Instructions (Signed)

## 2022-01-28 NOTE — Progress Notes (Unsigned)
01/28/2022 12:39 PM   Grant Stevens [birth year] [id number]  Referring provider: [name], Stevens [address] [place],  [place] [address]  Followup incomplete emptying   HPI: [name] is a 82yo here for followup for BPh and urinary retention. IPSS 2 QOl 0 on rapaflo '8mg'$  daily. PVR 27cc. He has urinary frequency.    PMH: Past Medical History:  Diagnosis Date   Allergic rhinitis    Arthritis    Asthma    Childhood   Atrial fibrillation ([place])    Remote history - WARCEF   Bladder cancer ([place])    BPH (benign prostatic hyperplasia)    Cardiomyopathy (HCC)    CHF (congestive heart failure) ([place])    a. EF 30-35% by echo in 2018 and 10/2018, at 25-30% in 05/2020   Coronary atherosclerosis of native coronary artery    Multivessel s/p CABG in 2002 post-IMI, LVEF 35% up to 50% postoperatively;   Depression    Difficulty sleeping    Erectile dysfunction    Essential hypertension    Frequency of urination    History of bipolar disorder    Hyperlipidemia    IBS (irritable bowel syndrome)    Myocardial infarction [facility]) 2002    Surgical History: Past Surgical History:  Procedure Laterality Date   BIOPSY  03/28/2018   Procedure: BIOPSY;  Surgeon: Rogene Houston, Stevens;  [facility];  Service: Endoscopy;;  esophagus   BIOPSY  03/09/2021   Procedure: BIOPSY;  Surgeon: Harvel Quale, Stevens;  [facility];  Service: Gastroenterology;;   BIV PACEMAKER INSERTION CRT-P N/A 05/12/2020   Procedure: BIV PACEMAKER INSERTION CRT-P;  Surgeon: Thompson Grayer, Stevens;  Location: [place] [facility];  Service: Cardiovascular;  Laterality: N/A;   CATARACT EXTRACTION W/PHACO Left 12/29/2014   Procedure: CATARACT EXTRACTION PHACO AND INTRAOCULAR LENS PLACEMENT (IOC);  Surgeon: Williams Che, Stevens;  Location: [facility];  Service: Ophthalmology;  Laterality: Left;  CDE:3.71   CATARACT EXTRACTION W/PHACO Right 03/30/2015   Procedure: CATARACT EXTRACTION PHACO AND  INTRAOCULAR LENS PLACEMENT RIGHT EYE CDE=2.56;  Surgeon: Williams Che, Stevens;  Location: [facility];  Service: Ophthalmology;  Laterality: Right;   CIRCUMCISION  10/11/2003   COLONOSCOPY N/A 07/11/2012   rehman,transverse colon polyp (benign lymphoid); tortuous colon; prep adequate with much suction/lavage; hemorrhoids.   CORONARY ARTERY BYPASS GRAFT  08/10/2000   Dr. Servando Snare - LIMA to LAD, SVG to diagonal, SVG to PDA TRIPLE BYPASS   CYSTOSCOPY W/ RETROGRADES Bilateral 03/02/2015   Procedure: CYSTOSCOPY WITH RIGHT RETROGRADE PYELOGRAM,  ATTEMPTED LEFT RETROGRADE PYELOGRAM;  Surgeon: Cleon Gustin, Stevens;  [facility]: [facility];  Service: Urology;  Laterality: Bilateral;   ESOPHAGEAL DILATION N/A 03/28/2018   Procedure: ESOPHAGEAL DILATION;  Surgeon: Rogene Houston, Stevens;  [facility];  Service: Endoscopy;  Laterality: N/A;   ESOPHAGOGASTRODUODENOSCOPY N/A 03/28/2018   rehman,Abnormal esophageal motility. mild schatzki ring at GEJ, dilated, patch of salmon colored mucosa at distal esophagus. Barrett's esophagus. 2cm HH. erosive gastropathy, normal pylorus, duodenal erosions w/o bleeding. normal second portion of duodenum   ESOPHAGOGASTRODUODENOSCOPY (EGD) WITH PROPOFOL N/A 03/09/2021   Procedure: ESOPHAGOGASTRODUODENOSCOPY (EGD) WITH PROPOFOL;  Surgeon: Harvel Quale, Stevens;  [facility];  Service: Gastroenterology;  Laterality: N/A;  945   TEMPORARY PACEMAKER N/A 05/12/2020   Procedure: TEMPORARY PACEMAKER;  Surgeon: Martinique, Peter M, Stevens;  Location: [place] [facility];  Service: Cardiovascular;  Laterality: N/A;   TONSILLECTOMY     TRANSURETHRAL  RESECTION OF BLADDER TUMOR N/A 01/26/2015   Procedure: TRANSURETHRAL RESECTION OF BLADDER TUMOR (TURBT);  Surgeon: Cleon Gustin, Stevens;  [facility];  Service: Urology;  Laterality: N/A;   TRANSURETHRAL RESECTION OF BLADDER TUMOR WITH GYRUS (TURBT-GYRUS) N/A 03/02/2015   Procedure: TRANSURETHRAL RESECTION OF BLADDER TUMOR  WITH GYRUS (TURBT-GYRUS);  Surgeon: Cleon Gustin, Stevens;  [facility];  Service: Urology;  Laterality: N/A;   TRANSURETHRAL RESECTION OF PROSTATE N/A 01/26/2015   Procedure: TRANSURETHRAL RESECTION OF THE PROSTATE WITH GYRUS INSTRUMENTS;  Surgeon: Cleon Gustin, Stevens;  [facility];  Service: Urology;  Laterality: N/A;    Home Medications:  Allergies as of 01/28/2022       Reactions   Spironolactone Other (See Comments)   Hyperkalemia        Medication List        Accurate as of January 28, 2022 12:39 PM. If you have any questions, ask your nurse or doctor.          aspirin EC 81 MG tablet Take 1 tablet (81 mg total) by mouth daily.   atorvastatin 80 MG tablet Commonly known as: LIPITOR Take 1 tablet (80 mg total) by mouth at bedtime.   bisoprolol 5 MG tablet Commonly known as: ZEBETA Take 0.5 tablets (2.5 mg total) by mouth daily.   carbamazepine 200 MG tablet Commonly known as: TEGRETOL Take 400 mg by mouth 2 (two) times daily.   cyanocobalamin 1000 MCG tablet Commonly known as: VITAMIN B12 Take 1 tablet (1,000 mcg total) by mouth daily.   divalproex 250 MG 24 hr tablet Commonly known as: DEPAKOTE ER Take 250 mg by mouth daily.   empagliflozin 10 MG Tabs tablet Commonly known as: Jardiance Take 1 tablet (10 mg total) by mouth daily before breakfast.   Entresto 24-26 MG Generic drug: sacubitril-valsartan TAKE (1) TABLET BY MOUTH TWICE DAILY.   EPINEPHrine 0.3 mg/0.3 mL Soaj injection Commonly known as: EPI-PEN Inject 0.3 mg into the muscle as needed for anaphylaxis.   famotidine 40 MG tablet Commonly known as: PEPCID TAKE (1) TABLET BY MOUTH AT BEDTIME.   furosemide 40 MG tablet Commonly known as: Lasix Take 0.5-1 tablets (20-40 mg total) by mouth daily. '20mg'$  one day then '40mg'$  the other day alternating '20mg'$ -'40mg'$  What changed:  how much to take additional instructions   loperamide 2 MG capsule Commonly known as: IMODIUM Take 1  capsule (2 mg total) by mouth every 6 (six) hours as needed for diarrhea or loose stools.   LORazepam 0.5 MG tablet Commonly known as: ATIVAN Take 0.5 mg by mouth at bedtime.   pantoprazole 40 MG tablet Commonly known as: PROTONIX Take 1 tablet (40 mg total) by mouth daily before breakfast.   silodosin 8 MG Caps capsule Commonly known as: RAPAFLO Take 1 capsule (8 mg total) by mouth daily with breakfast.   tamsulosin 0.4 MG Caps capsule Commonly known as: FLOMAX Take 1 capsule (0.4 mg total) by mouth at bedtime.   temazepam 15 MG capsule Commonly known as: RESTORIL Take 15 mg by mouth at bedtime as needed for sleep.        Allergies:  Allergies  Allergen Reactions   Spironolactone Other (See Comments)    Hyperkalemia    Family History: Family History  Problem Relation Age of Onset   Asthma Mother    Heart attack Father 57   Diabetes Brother     Social History:  reports that he quit smoking about 51 years ago. His  smoking use included cigarettes. He has a 15.00 pack-year smoking history. He has been exposed to tobacco smoke. He has never used smokeless tobacco. He reports that he does not currently use alcohol. He reports that he does not use drugs.  ROS: All other review of systems were reviewed and are negative except what is noted above in HPI  Physical Exam: BP 106/75   Pulse (!) 103   Constitutional:  Alert and oriented, No acute distress. HEENT: [place] AT, moist mucus membranes.  Trachea midline, no masses. Cardiovascular: No clubbing, cyanosis, or edema. Respiratory: Normal respiratory effort, no increased work of breathing. GI: Abdomen is soft, nontender, nondistended, no abdominal masses GU: No CVA tenderness.  Lymph: No cervical or inguinal lymphadenopathy. Skin: No rashes, bruises or suspicious lesions. Neurologic: Grossly intact, no focal deficits, moving all 4 extremities. Psychiatric: Normal mood and affect.  Laboratory Data: Lab Results   Component Value Date   WBC 8.9 07/16/2021   HGB 10.8 (L) 07/16/2021   HCT 33.7 (L) 07/16/2021   MCV 96.0 07/16/2021   PLT 107 (L) 07/16/2021    Lab Results  Component Value Date   CREATININE 0.78 07/16/2021    No results found for: "PSA"  No results found for: "TESTOSTERONE"  Lab Results  Component Value Date   HGBA1C 5.7 (H) 05/12/2020    Urinalysis    Component Value Date/Time   COLORURINE YELLOW 07/15/2021 2023   APPEARANCEUR CLEAR 07/15/2021 2023   APPEARANCEUR Clear 06/11/2021 1458   LABSPEC 1.024 07/15/2021 2023   PHURINE 5.0 07/15/2021 2023   GLUCOSEU 150 (A) 07/15/2021 2023   HGBUR NEGATIVE 07/15/2021 2023   BILIRUBINUR NEGATIVE 07/15/2021 2023   BILIRUBINUR Negative 06/11/2021 1458   KETONESUR 20 (A) 07/15/2021 2023   PROTEINUR NEGATIVE 07/15/2021 2023   UROBILINOGEN 0.2 11/06/2014 2016   NITRITE NEGATIVE 07/15/2021 2023   LEUKOCYTESUR NEGATIVE 07/15/2021 2023    Lab Results  Component Value Date   LABMICR Comment 06/11/2021   BACTERIA NONE SEEN 07/02/2021    Pertinent Imaging: *** Results for orders placed during the hospital encounter of 07/02/21  DG Abd 1 View  Narrative CLINICAL DATA:  Diarrhea x several days  EXAM: ABDOMEN - 1 VIEW  COMPARISON:  08/17/2020  FINDINGS: Bowel gas pattern is nonspecific. Small to moderate amount of stool is seen in the colon. There is interval decrease in amount of colonic stool. There is no fecal impaction in the rectosigmoid. No abnormal masses or calcifications are seen. Kidneys are partly obscured by bowel contents. Visualized lower lung fields are clear. There is evidence of previous coronary bypass surgery and placement of cardiac pacemaker.  IMPRESSION: Nonspecific bowel gas pattern. Small to moderate stool burden in the colon without fecal impaction in the rectosigmoid.   Electronically Signed By: Elmer Picker M.D. On: 07/02/2021 18:23  No results found for this or any previous  visit.  No results found for this or any previous visit.  No results found for this or any previous visit.  No results found for this or any previous visit.  No valid procedures specified. No results found for this or any previous visit.  Results for orders placed during the hospital encounter of 02/20/15  CT RENAL STONE STUDY  Narrative CLINICAL DATA:  One day history of hematuria. Patient reports history of urinary bladder mass  EXAM: CT ABDOMEN AND PELVIS WITHOUT CONTRAST  TECHNIQUE: Multidetector CT imaging of the abdomen and pelvis was performed following the standard protocol without oral or intravenous contrast  material administration.  COMPARISON:  CT abdomen and pelvis November 13, 2014  FINDINGS: Lower chest: There is mild scarring in the anterior left base region. Lung bases otherwise are clear. There is a fairly small hiatal hernia. There are multiple foci of coronary artery calcification.  Hepatobiliary: There is a small calcified granuloma in the anterior right liver apex region. No other focal liver lesions are identified on this noncontrast enhanced study. Gallbladder is somewhat contracted without apparent wall thickening given the degree of contraction of the gallbladder. There is no appreciable biliary duct dilatation.  Pancreas: No pancreatic mass or inflammatory focus.  Spleen: No splenic lesions are identified. There is a tiny splenule medial to the spleen posteriorly.  Adrenals/Urinary Tract: Adrenals appear unremarkable bilaterally. Kidneys bilaterally show no demonstrable mass or hydronephrosis on either side. There is no appreciable renal or ureteral calculus on either side. Within the urinary bladder posteriorly and inferiorly, there is a solid-appearing mass measuring 6.0 x 4.4 x 2.7 cm. The wall of the urinary bladder is not appreciably thickened. The urinary bladder is mildly distended at this time.  Stomach/Bowel: The wall of the  rectum appears modestly thickened in a generalized manner. There is no perirectal stranding or fistula. There is no other bowel wall thickening. No mesenteric thickening is seen. No bowel obstruction. No free air or portal venous air.  Vascular/Lymphatic: There is atherosclerotic calcification in the aorta. There is no abdominal aortic aneurysm. No vascular lesions are apparent on this noncontrast enhanced study. There is no demonstrable adenopathy in the abdomen or pelvis.  Reproductive: Prostate does not appear enlarged. However, there is loss of clear definition between the superior prostate and inferior bladder. Seminal vesicles do not appear enlarged. There is no pelvic mass outside of the urinary bladder. No free pelvic fluid.  Other: Appendix region appears unremarkable. No abscess or ascites is noted in the abdomen or pelvis.  Musculoskeletal: There is degenerative change in the lumbar spine, most marked at L5-S1. There are no blastic or lytic bone lesions. There is no intramuscular or abdominal wall lesion.  IMPRESSION: There is a mass in the posterior inferior urinary bladder measuring 6.0 x 4.4 x 2.7 cm. Urinary bladder neoplasm is of concern. There is no renal or ureteral calculus on either side. No hydronephrosis.  The superior aspect of the prostate cannot be delineated apart from the inferior urinary bladder wall. Significance of this finding is uncertain. Correlation with PSA advised.  Mild rectal wall thickening without associated stranding or fistula. Significance of this finding is uncertain. No other bowel wall thickening seen. No bowel obstruction.  There is a fairly small hiatal hernia.  No abscess.  Periappendiceal region normal.  Multiple foci of coronary artery calcification.   Electronically Signed By: Lowella Grip III M.D. On: 02/20/2015 14:33   Assessment & Plan:    1. Incomplete emptying of bladder *** - BLADDER SCAN AMB  NON-IMAGING - Urinalysis, Routine w reflex microscopic  2. Benign prostatic hyperplasia with urinary obstruction ***   No follow-ups on file.  Nicolette Bang, Stevens  [facility] [place]  \\

## 2022-01-28 NOTE — Progress Notes (Unsigned)
post void residual=27 

## 2022-02-02 ENCOUNTER — Ambulatory Visit (HOSPITAL_COMMUNITY): Admission: RE | Admit: 2022-02-02 | Payer: Medicare HMO | Source: Ambulatory Visit

## 2022-02-03 ENCOUNTER — Telehealth (INDEPENDENT_AMBULATORY_CARE_PROVIDER_SITE_OTHER): Payer: Self-pay | Admitting: *Deleted

## 2022-02-03 NOTE — Telephone Encounter (Signed)
Called to check on status of referral sent 12/16/21, spoke to [place] and referral was canceled on 12/27/21 after multiple attempts on multiple days to contact patient with no response

## 2022-02-03 NOTE — Telephone Encounter (Signed)
Thanks for the update

## 2022-02-08 ENCOUNTER — Telehealth: Payer: Self-pay | Admitting: Cardiology

## 2022-02-08 MED ORDER — EMPAGLIFLOZIN 10 MG PO TABS: 10.0000 mg | ORAL_TABLET | Freq: Every day | ORAL | 3 refills | Status: DC

## 2022-02-08 NOTE — Telephone Encounter (Signed)
Patient needs a refill on jardiance pharmacy told him he no longer has refills on it. [facility] on [address]

## 2022-02-08 NOTE — Telephone Encounter (Signed)
Completed.

## 2022-02-09 ENCOUNTER — Ambulatory Visit: Payer: Medicare HMO

## 2022-02-09 DIAGNOSIS — I255 Ischemic cardiomyopathy: Secondary | ICD-10-CM | POA: Diagnosis not present

## 2022-02-10 LAB — CUP PACEART REMOTE DEVICE CHECK
Battery Remaining Longevity: 82 mo
Battery Remaining Percentage: 84 %
Battery Voltage: 3.01 V
Brady Statistic AP VP Percent: 30 %
Brady Statistic AP VS Percent: 1 %
Brady Statistic AS VP Percent: 67 %
Brady Statistic AS VS Percent: 2.6 %
Brady Statistic RA Percent Paced: 30 %
Brady Statistic RV Percent Paced: 96 %
Date Time Interrogation Session: 20240201003133
Implantable Lead Connection Status: 753985
Implantable Lead Connection Status: 753985
Implantable Lead Implant Date: 20220503
Implantable Lead Implant Date: 20220503
Implantable Lead Location: 753859
Implantable Lead Location: 753860
Implantable Pulse Generator Implant Date: 20220503
Lead Channel Impedance Value: 360 Ohm
Lead Channel Impedance Value: 390 Ohm
Lead Channel Pacing Threshold Amplitude: 0.75 V
Lead Channel Pacing Threshold Amplitude: 0.75 V
Lead Channel Pacing Threshold Pulse Width: 0.5 ms
Lead Channel Pacing Threshold Pulse Width: 0.5 ms
Lead Channel Sensing Intrinsic Amplitude: 0.9 mV
Lead Channel Sensing Intrinsic Amplitude: 5 mV
Lead Channel Setting Pacing Amplitude: 2 V
Lead Channel Setting Pacing Amplitude: 2.5 V
Lead Channel Setting Pacing Pulse Width: 0.5 ms
Lead Channel Setting Sensing Sensitivity: 2 mV
Pulse Gen Model: 2272
Pulse Gen Serial Number: 3920512

## 2022-02-18 ENCOUNTER — Emergency Department (HOSPITAL_COMMUNITY)
Admission: EM | Admit: 2022-02-18 | Discharge: 2022-02-18 | Disposition: A | Discharge status: Home / Self Care | Payer: Medicare HMO | Attending: Emergency Medicine | Admitting: Emergency Medicine

## 2022-02-18 ENCOUNTER — Other Ambulatory Visit: Payer: Self-pay

## 2022-02-18 ENCOUNTER — Encounter (HOSPITAL_COMMUNITY): Payer: Self-pay | Admitting: *Deleted

## 2022-02-18 ENCOUNTER — Emergency Department (HOSPITAL_COMMUNITY): Payer: Medicare HMO

## 2022-02-18 DIAGNOSIS — R7989 Other specified abnormal findings of blood chemistry: Secondary | ICD-10-CM | POA: Diagnosis not present

## 2022-02-18 DIAGNOSIS — K6389 Other specified diseases of intestine: Secondary | ICD-10-CM | POA: Diagnosis not present

## 2022-02-18 DIAGNOSIS — K6289 Other specified diseases of anus and rectum: Secondary | ICD-10-CM | POA: Diagnosis not present

## 2022-02-18 DIAGNOSIS — K5900 Constipation, unspecified: Secondary | ICD-10-CM | POA: Diagnosis not present

## 2022-02-18 DIAGNOSIS — I10 Essential (primary) hypertension: Secondary | ICD-10-CM | POA: Diagnosis not present

## 2022-02-18 DIAGNOSIS — E119 Type 2 diabetes mellitus without complications: Secondary | ICD-10-CM | POA: Diagnosis not present

## 2022-02-18 DIAGNOSIS — K5120 Ulcerative (chronic) proctitis without complications: Secondary | ICD-10-CM | POA: Insufficient documentation

## 2022-02-18 DIAGNOSIS — K5903 Drug induced constipation: Secondary | ICD-10-CM | POA: Diagnosis not present

## 2022-02-18 DIAGNOSIS — K5939 Other megacolon: Secondary | ICD-10-CM | POA: Diagnosis not present

## 2022-02-18 DIAGNOSIS — Z7982 Long term (current) use of aspirin: Secondary | ICD-10-CM | POA: Diagnosis not present

## 2022-02-18 DIAGNOSIS — Z7984 Long term (current) use of oral hypoglycemic drugs: Secondary | ICD-10-CM | POA: Diagnosis not present

## 2022-02-18 LAB — CBC WITH DIFFERENTIAL/PLATELET
Abs Immature Granulocytes: 0.04 10*3/uL (ref 0.00–0.07)
Basophils Absolute: 0 10*3/uL (ref 0.0–0.1)
Basophils Relative: 0 %
Eosinophils Absolute: 0.1 10*3/uL (ref 0.0–0.5)
Eosinophils Relative: 1 %
HCT: 35.8 % — ABNORMAL LOW (ref 39.0–52.0)
Hemoglobin: 11.4 g/dL — ABNORMAL LOW (ref 13.0–17.0)
Immature Granulocytes: 0 %
Lymphocytes Relative: 5 %
Lymphs Abs: 0.5 10*3/uL — ABNORMAL LOW (ref 0.7–4.0)
MCH: 30.7 pg (ref 26.0–34.0)
MCHC: 31.8 g/dL (ref 30.0–36.0)
MCV: 96.5 fL (ref 80.0–100.0)
Monocytes Absolute: 0.6 10*3/uL (ref 0.1–1.0)
Monocytes Relative: 6 %
Neutro Abs: 8.7 10*3/uL — ABNORMAL HIGH (ref 1.7–7.7)
Neutrophils Relative %: 88 %
Platelets: 127 10*3/uL — ABNORMAL LOW (ref 150–400)
RBC: 3.71 MIL/uL — ABNORMAL LOW (ref 4.22–5.81)
RDW: 13.9 % (ref 11.5–15.5)
WBC: 9.9 10*3/uL (ref 4.0–10.5)
nRBC: 0 % (ref 0.0–0.2)

## 2022-02-18 LAB — PROTIME-INR
INR: 1 (ref 0.8–1.2)
Prothrombin Time: 13.4 seconds (ref 11.4–15.2)

## 2022-02-18 LAB — COMPREHENSIVE METABOLIC PANEL
ALT: 25 U/L (ref 0–44)
AST: 24 U/L (ref 15–41)
Albumin: 3.8 g/dL (ref 3.5–5.0)
Alkaline Phosphatase: 98 U/L (ref 38–126)
Anion gap: 11 (ref 5–15)
BUN: 28 mg/dL — ABNORMAL HIGH (ref 8–23)
CO2: 26 mmol/L (ref 22–32)
Calcium: 8.7 mg/dL — ABNORMAL LOW (ref 8.9–10.3)
Chloride: 100 mmol/L (ref 98–111)
Creatinine, Ser: 1.29 mg/dL — ABNORMAL HIGH (ref 0.61–1.24)
GFR, Estimated: 56 mL/min — ABNORMAL LOW (ref >60)
Glucose, Bld: 95 mg/dL (ref 70–99)
Potassium: 4.8 mmol/L (ref 3.5–5.1)
Sodium: 137 mmol/L (ref 135–145)
Total Bilirubin: 0.3 mg/dL (ref 0.3–1.2)
Total Protein: 7.3 g/dL (ref 6.5–8.1)

## 2022-02-18 LAB — TYPE AND SCREEN
ABO/RH(D): A POS
Antibody Screen: NEGATIVE

## 2022-02-18 MED ORDER — IOHEXOL 300 MG/ML  SOLN
100.0000 mL | Freq: Once | INTRAMUSCULAR | Status: AC | PRN
  Administered 2022-02-18: 100 mL via INTRAVENOUS

## 2022-02-18 NOTE — ED Triage Notes (Signed)
Pt with c/o constipation for past 2 days.  Noted blood on toilet paper today.  Pt not able to state if he has hemorrhoids. Pt denies taking anything for his constipation.

## 2022-02-18 NOTE — Discharge Instructions (Signed)
Your testing today shows that you have some inflammation in your bowels that is because of the constipation that she had.  I want for you to take the following medications exactly as prescribed and make sure that you are stopping the Imodium immediately.  Take an enema once a day.  You can do any kind of enema that he can get over-the-counter 2.  Take MiraLAX, 1 scoop of MiraLAX in 8 ounces of water or any drink of your choice, do this 3 times daily until you are having regular soft bowel movements 3.  Senna, 1 tablet in the morning and 1 tablet in the evening until having regular soft bowel movements  Make sure that you are drinking plenty of clear liquids.  You will need to make sure that you are staying hydrated.   Thank you for allowing [place] to treat you in the emergency department today.  After reviewing your examination and potential testing that was done it appears that you are safe to go home.  I would like for you to follow-up with your doctor within the next several days, have them obtain your results and follow-up with them to review all of these tests.  If you should develop severe or worsening symptoms return to the emergency department immediately

## 2022-02-18 NOTE — ED Provider Notes (Signed)
[facility] Provider Note   CSN: [id number] Arrival date & time: 02/18/22  1558     History  Chief Complaint  Patient presents with   Constipation    Grant Stevens is a 82 y.o. male.   Constipation  This patient is a 82 year old male, he has a history of some chronic diarrhea for which she has been treated with Imodium which she takes up to 4 times per day.  He has not had any of this medication in a couple of days and endorses now that for the last 48 hours he has had some constipation and feeling like his stool is rockhard, he also reports that today he had some blood in his undergarment.  He denies any history of rectal bleeding, his medication record shows that he does not take any anticoagulants and he denies taking any other anticoagulants at home.  He does take Depakote, Jardiance for diabetes, furosemide, Lipitor, and does show a baby aspirin.  He denies frank abdominal pain or vomiting, no fevers or chills, he states that he had a large amount of blood today and is unsure if he had a hemorrhoid    Home Medications Prior to Admission medications   Medication Sig Start Date End Date Taking? Authorizing Provider  aspirin EC 81 MG tablet Take 1 tablet (81 mg total) by mouth daily. 03/31/18   Rehman, Mechele Dawley, MD  atorvastatin (LIPITOR) 80 MG tablet Take 1 tablet (80 mg total) by mouth at bedtime. 01/19/22   [name], MD  bisoprolol (ZEBETA) 5 MG tablet Take 0.5 tablets (2.5 mg total) by mouth daily. 01/11/22   [name], MD  carbamazepine (TEGRETOL) 200 MG tablet Take 400 mg by mouth 2 (two) times daily.     [provider]  divalproex (DEPAKOTE ER) 250 MG 24 hr tablet Take 250 mg by mouth daily. 03/12/18   [provider]  empagliflozin (JARDIANCE) 10 MG TABS tablet Take 1 tablet (10 mg total) by mouth daily before breakfast. 02/08/22   [name], MD  EPINEPHrine 0.3 mg/0.3 mL IJ SOAJ  injection Inject 0.3 mg into the muscle as needed for anaphylaxis. 09/13/21   Orpah Greek, MD  famotidine (PEPCID) 40 MG tablet TAKE (1) TABLET BY MOUTH AT BEDTIME. 11/22/21   Carlan, Chelsea L, NP  furosemide (LASIX) 40 MG tablet Take 0.5-1 tablets (20-40 mg total) by mouth daily. 5m one day then 421mthe other day alternating 2067m0mg 01/28/22   McDSatira SarkD  loperamide (IMODIUM) 2 MG capsule Take 1 capsule (2 mg total) by mouth every 6 (six) hours as needed for diarrhea or loose stools. 07/16/21   MemKathie DikeD  LORazepam (ATIVAN) 0.5 MG tablet Take 0.5 mg by mouth at bedtime. Patient not taking: Reported on 01/19/2022    [provider]  pantoprazole (PROTONIX) 40 MG tablet Take 1 tablet (40 mg total) by mouth daily before breakfast. Patient not taking: Reported on 01/19/2022 12/16/21   Carlan, CheDeatra RobinsonP  sacubitril-valsartan (ENTRESTO) 24-26 MG TAKE (1) TABLET BY MOUTH TWICE DAILY. 12/01/21   McDSatira SarkD  silodosin (RAPAFLO) 8 MG CAPS capsule Take 1 capsule (8 mg total) by mouth daily with breakfast. 01/28/22   McKenzie, PatCandee FurbishD  tamsulosin (FLOMAX) 0.4 MG CAPS capsule Take 1 capsule (0.4 mg total) by mouth at bedtime. Patient not taking: Reported on 01/19/2022 07/07/20   McKCleon GustinD  temazepam (RESTORIL)  15 MG capsule Take 15 mg by mouth at bedtime as needed for sleep. Patient not taking: Reported on 01/19/2022    [provider]  vitamin B-12 (CYANOCOBALAMIN) 1000 MCG tablet Take 1 tablet (1,000 mcg total) by mouth daily. 07/16/21   Kathie Dike, MD      Allergies    Spironolactone    Review of Systems   Review of Systems  Gastrointestinal:  Positive for constipation.  All other systems reviewed and are negative.   Physical Exam Updated Vital Signs BP (!) 155/85   Pulse 77   Temp 98.1 F (36.7 C) (Oral)   Resp (!) 29   Ht 1.829 m (6')   Wt 101.2 kg   SpO2 99%   BMI 30.24 kg/m  Physical Exam Vitals and  nursing note reviewed.  Constitutional:      General: He is not in acute distress.    Appearance: He is well-developed.  HENT:     Head: Normocephalic and atraumatic.     Mouth/Throat:     Pharynx: No oropharyngeal exudate.  Eyes:     General: No scleral icterus.       Right eye: No discharge.        Left eye: No discharge.     Conjunctiva/sclera: Conjunctivae normal.     Pupils: Pupils are equal, round, and reactive to light.  Neck:     Thyroid: No thyromegaly.     Vascular: No JVD.  Cardiovascular:     Rate and Rhythm: Normal rate and regular rhythm.     Heart sounds: Normal heart sounds. No murmur heard.    No friction rub. No gallop.  Pulmonary:     Effort: Pulmonary effort is normal. No respiratory distress.     Breath sounds: Normal breath sounds. No wheezing or rales.  Abdominal:     General: Bowel sounds are normal. There is no distension.     Palpations: Abdomen is soft. There is no mass.     Tenderness: There is no abdominal tenderness.  Genitourinary:    Comments: Rectal exam shows normal anal sphincter, no signs of masses or tumors, has brown stool without significant constipation, there is no masses internally, he does have tenderness significantly on internal exam Musculoskeletal:        General: No tenderness. Normal range of motion.     Cervical back: Normal range of motion and neck supple.     Right lower leg: No edema.     Left lower leg: No edema.  Lymphadenopathy:     Cervical: No cervical adenopathy.  Skin:    General: Skin is warm and dry.     Findings: No erythema or rash.  Neurological:     Mental Status: He is alert.     Coordination: Coordination normal.  Psychiatric:        Behavior: Behavior normal.     ED Results / Procedures / Treatments   Labs (all labs ordered are listed, but only abnormal results are displayed) Labs Reviewed  CBC WITH DIFFERENTIAL/PLATELET - Abnormal; Notable for the following components:      Result Value   RBC  3.71 (*)    Hemoglobin 11.4 (*)    HCT 35.8 (*)    Platelets 127 (*)    Neutro Abs 8.7 (*)    Lymphs Abs 0.5 (*)    All other components within normal limits  COMPREHENSIVE METABOLIC PANEL - Abnormal; Notable for the following components:   BUN 28 (*)  Creatinine, Ser 1.29 (*)    Calcium 8.7 (*)    GFR, Estimated 56 (*)    All other components within normal limits  PROTIME-INR  TYPE AND SCREEN    EKG EKG Interpretation  Date/Time:  Friday February 18 2022 19:39:06 EST Ventricular Rate:  80 PR Interval:  272 QRS Duration: 243 QT Interval:  461 QTC Calculation: 532 R Axis:   -43 Text Interpretation: Sinus or ectopic atrial rhythm Prolonged PR interval Left bundle branch block Baseline wander in lead(s) V1 since last tracing no significant change Confirmed by Noemi Chapel [phone]) on 02/18/2022 7:42:53 PM  Radiology CT PELVIS W CONTRAST  Result Date: 02/18/2022 CLINICAL DATA:  Rectal pain with lower GI bleeding and constipation. EXAM: CT PELVIS WITH CONTRAST TECHNIQUE: Multidetector CT imaging of the pelvis was performed using the standard protocol following the bolus administration of intravenous contrast. RADIATION DOSE REDUCTION: This exam was performed according to the departmental dose-optimization program which includes automated exposure control, adjustment of the mA and/or kV according to patient size and/or use of iterative reconstruction technique. CONTRAST:  114m OMNIPAQUE IOHEXOL 300 MG/ML  SOLN COMPARISON:  CT chest abdomen and pelvis 07/15/2021 FINDINGS: Urinary Tract:  No abnormality visualized. Bowel: Rectum is dilated and stool-filled. There is rectal wall thickening with surrounding inflammation. There is a large amount of stool throughout the remaining colon. There is no evidence for bowel obstruction. The appendix is within normal limits. Vascular/Lymphatic: No pathologically enlarged lymph nodes. No significant vascular abnormality seen. Reproductive:  No mass or  other significant abnormality Other:  None. Musculoskeletal: Degenerative changes affect the spine. IMPRESSION: Large amount of stool throughout the colon with rectal wall thickening and surrounding inflammation worrisome for stercoral colitis. Electronically Signed   By: ARonney AstersM.D.   On: 02/18/2022 22:25    Procedures Procedures    Medications Ordered in ED Medications  iohexol (OMNIPAQUE) 300 MG/ML solution 100 mL (100 mLs Intravenous Contrast Given 02/18/22 2201)    ED Course/ Medical Decision Making/ A&P                             Medical Decision Making Amount and/or Complexity of Data Reviewed Labs: ordered. Radiology: ordered.  Risk Prescription drug management.   This patient presents to the ED for concern of rectal bleeding and pain differential diagnosis includes hemorrhoid, fissure, GI bleed    Additional history obtained:  Additional history obtained from medical record External records from outside source obtained and reviewed including upper endoscopy performed by Dr. CJenetta Downer1 year ago, this was done because of a history of Barrett's esophagus.  The patient did have a small hiatal hernia, short segment Barrett's esophagus, no other significant acute findings.   Lab Tests:  I Ordered, and personally interpreted labs.  The pertinent results include: CBC and metabolic panel are rather unremarkable, specifically no leukocytosis, no significant anemia, chronic thrombocytopenia, creatinine is slightly elevated at 1.29 but has been much higher in the past as recently as July. Hemoccult positive minimally, brown stool in the rectal vault   Imaging Studies ordered:  I ordered imaging studies including CT scan of the pelvis with contrast I independently visualized and interpreted imaging which showed CT scan of the pelvis which showed no signs of surgical causes, there is STIR colitis secondary to constipation with a heavy stool burden, no abscesses I agree  with the radiologist interpretation   Medicines ordered and prescription drug management:  I ordered  medication including multiple different laxatives for treatment of constipation at home Reevaluation of the patient after these medicines showed that the patient improved I have reviewed the patients home medicines and have made adjustments as needed   Problem List / ED Course:  Patient has a stable abdomen, nonsurgical, vital signs reassuring, CT scan discussed with the patient and his nephew at the bedside   Social Determinants of Health:  Stable for discharge, expressed understanding to the indications for return           Final Clinical Impression(s) / ED Diagnoses Final diagnoses:  Proctitis  Drug-induced constipation    Rx / DC Orders ED Discharge Orders     None         Noemi Chapel, MD 02/18/22 2308

## 2022-02-21 ENCOUNTER — Ambulatory Visit (INDEPENDENT_AMBULATORY_CARE_PROVIDER_SITE_OTHER): Payer: PPO | Admitting: Gastroenterology

## 2022-02-21 ENCOUNTER — Ambulatory Visit (INDEPENDENT_AMBULATORY_CARE_PROVIDER_SITE_OTHER): Payer: Medicare HMO | Admitting: Gastroenterology

## 2022-02-21 ENCOUNTER — Encounter (INDEPENDENT_AMBULATORY_CARE_PROVIDER_SITE_OTHER): Payer: Self-pay | Admitting: Gastroenterology

## 2022-02-21 ENCOUNTER — Encounter (INDEPENDENT_AMBULATORY_CARE_PROVIDER_SITE_OTHER): Payer: Self-pay | Admitting: *Deleted

## 2022-02-21 VITALS — BP 130/74 | HR 69 | Temp 98.5°F | Ht 72.0 in | Wt 226.3 lb

## 2022-02-21 DIAGNOSIS — K5900 Constipation, unspecified: Secondary | ICD-10-CM | POA: Diagnosis not present

## 2022-02-21 DIAGNOSIS — K219 Gastro-esophageal reflux disease without esophagitis: Secondary | ICD-10-CM

## 2022-02-21 DIAGNOSIS — R1310 Dysphagia, unspecified: Secondary | ICD-10-CM

## 2022-02-21 NOTE — H&P (View-Only) (Signed)
Referring Provider: [name], MD Primary Care Physician:  [name], MD Primary GI Physician: Jenetta Downer   Chief Complaint  Patient presents with   Dysphagia    Having trouble swallowing and choking on food. Choked this morning on a cookie. Went to ED on 2/9 for rectal bleeding and constipation.    HPI:   Grant Stevens is a 82 y.o. male with past medical history of localized bladder cancer s/p BCG injection, MI s/p CABG, asthma, BPH, cardiomyopathy, Heart failure, HTN, bipolar disorder, HLD, Barrett's esophagus, and IBS    Patient presenting today for ongoing dysphagia.   Last seen December 2023, continuing to have dysphagia, feels it may be worse than prior. Having to eat very slow, especially with meats. Issues occurring 2-3x/day. Sometimes has issues with pills as well. No issues with swallowing liquids. Sometimes substances can come right back up after he swallows, then will swallow again and food or pills will go down. Sometimes foods will sit in his mid to lower throat. Feels hoarse at time as well. Denies GERD symptoms. He is taking protonix but not everyday compliantly. Does not think he is taking pepcid in the evening.    Recent ED visit on 2/9 for constipation/rectal bleeding, CT Pelvis W contrast at that time with Large amount of stool throughout the colon with rectal wall thickening and surrounding inflammation worrisome for stercoral colitis.  Present:  Patient reports recent ER visit for constipation and some associated rectal bleeding with some blood noted in his underwear. CT as above with stercoral colitis.  Notes that after ED visit, he did the enema and was taking senna and miralax and finally had a Good BM. Has had no further rectal bleeding.He is not currently taking anything as his stools are soft now and he is moving his bowels daily. Denies any abdominal pain. Denies any melena. Notes water intake is not great, mostly drinking sodas. He is eating prunes.     Continues to have dysphagia, had some issues this morning around 3am he ate a cookie and failed to drink any liquids with it and the cookie got stuck, noting he had to go cough up the food as he could not get it to pass. Has had issues with a ham biscuit in the past where he had to call EMS but was eventually able to get the food up prior to them arriving. He notes that he tries to eat very slow and avoid certain foods so that he does not choke. Denies GERD symptoms, he is taking protonix '40mg'$  daily and pepcid '40mg'$  at night. Denies issues with liquids or pills going down. dysphagia symptoms occurring maybe  3-4x/week. At last visit he agreed to referral to [facility] for esophageal manometry but later declined to schedule the appt with them.   Last EGD:03/09/21- Esophageal mucosal changes consistent with short-segment Barrett's esophagus. Biopsied-1 cm w/o dysplasia - 2 cm hiatal hernia. - Discolored mucosa in the antrum. Biopsied-normal - Normal examined duodenum. Last Colonoscopy:2014 Examination performed to cecum. Quality of prep somewhat compromised examination of ascending colon and cecum. Redundant colon. Small polyp ablated via cold biopsy from proximal transverse colon - benign lymph node. Small external hemorrhoids   Past Medical History:  Diagnosis Date   Allergic rhinitis    Arthritis    Asthma    Childhood   Atrial fibrillation ([place])    Remote history - WARCEF   Bladder cancer ([facility])    BPH (benign prostatic hyperplasia)    Cardiomyopathy ([place])  CHF (congestive heart failure) ([place])    a. EF 30-35% by echo in 2018 and 10/2018, at 25-30% in 05/2020   Coronary atherosclerosis of native coronary artery    Multivessel s/p CABG in 2002 post-IMI, LVEF 35% up to 50% postoperatively;   Depression    Difficulty sleeping    Erectile dysfunction    Essential hypertension    Frequency of urination    History of bipolar disorder    Hyperlipidemia    IBS (irritable bowel syndrome)     Myocardial infarction [facility]) 2002    Past Surgical History:  Procedure Laterality Date   BIOPSY  03/28/2018   Procedure: BIOPSY;  Surgeon: Rogene Houston, MD;  [facility];  Service: Endoscopy;;  esophagus   BIOPSY  03/09/2021   Procedure: BIOPSY;  Surgeon: Harvel Quale, MD;  [facility];  Service: Gastroenterology;;   BIV PACEMAKER INSERTION CRT-P N/A 05/12/2020   Procedure: BIV PACEMAKER INSERTION CRT-P;  Surgeon: Thompson Grayer, MD;  Location: [place] [facility];  Service: Cardiovascular;  Laterality: N/A;   CATARACT EXTRACTION W/PHACO Left 12/29/2014   Procedure: CATARACT EXTRACTION PHACO AND INTRAOCULAR LENS PLACEMENT (IOC);  Surgeon: Williams Che, MD;  Location: [facility];  Service: Ophthalmology;  Laterality: Left;  CDE:3.71   CATARACT EXTRACTION W/PHACO Right 03/30/2015   Procedure: CATARACT EXTRACTION PHACO AND INTRAOCULAR LENS PLACEMENT RIGHT EYE CDE=2.56;  Surgeon: Williams Che, MD;  Location: [facility];  Service: Ophthalmology;  Laterality: Right;   CIRCUMCISION  10/11/2003   COLONOSCOPY N/A 07/11/2012   rehman,transverse colon polyp (benign lymphoid); tortuous colon; prep adequate with much suction/lavage; hemorrhoids.   CORONARY ARTERY BYPASS GRAFT  08/10/2000   Dr. Servando Snare - LIMA to LAD, SVG to diagonal, SVG to PDA TRIPLE BYPASS   CYSTOSCOPY W/ RETROGRADES Bilateral 03/02/2015   Procedure: CYSTOSCOPY WITH RIGHT RETROGRADE PYELOGRAM,  ATTEMPTED LEFT RETROGRADE PYELOGRAM;  Surgeon: Cleon Gustin, MD;  [facility];  Service: Urology;  Laterality: Bilateral;   ESOPHAGEAL DILATION N/A 03/28/2018   Procedure: ESOPHAGEAL DILATION;  Surgeon: Rogene Houston, MD;  [facility];  Service: Endoscopy;  Laterality: N/A;   ESOPHAGOGASTRODUODENOSCOPY N/A 03/28/2018   rehman,Abnormal esophageal motility. mild schatzki ring at GEJ, dilated, patch of salmon colored mucosa at distal esophagus. Barrett's esophagus. 2cm HH. erosive  gastropathy, normal pylorus, duodenal erosions w/o bleeding. normal second portion of duodenum   ESOPHAGOGASTRODUODENOSCOPY (EGD) WITH PROPOFOL N/A 03/09/2021   Procedure: ESOPHAGOGASTRODUODENOSCOPY (EGD) WITH PROPOFOL;  Surgeon: Harvel Quale, MD;  [facility];  Service: Gastroenterology;  Laterality: N/A;  945   TEMPORARY PACEMAKER N/A 05/12/2020   Procedure: TEMPORARY PACEMAKER;  Surgeon: Martinique, Peter M, MD;  Location: [place] [facility];  Service: Cardiovascular;  Laterality: N/A;   TONSILLECTOMY     TRANSURETHRAL RESECTION OF BLADDER TUMOR N/A 01/26/2015   Procedure: TRANSURETHRAL RESECTION OF BLADDER TUMOR (TURBT);  Surgeon: Cleon Gustin, MD;  [facility];  Service: Urology;  Laterality: N/A;   TRANSURETHRAL RESECTION OF BLADDER TUMOR WITH GYRUS (TURBT-GYRUS) N/A 03/02/2015   Procedure: TRANSURETHRAL RESECTION OF BLADDER TUMOR WITH GYRUS (TURBT-GYRUS);  Surgeon: Cleon Gustin, MD;  [facility];  Service: Urology;  Laterality: N/A;   TRANSURETHRAL RESECTION OF PROSTATE N/A 01/26/2015   Procedure: TRANSURETHRAL RESECTION OF THE PROSTATE WITH GYRUS INSTRUMENTS;  Surgeon: Cleon Gustin, MD;  [facility];  Service: Urology;  Laterality: N/A;    Current Outpatient Medications  Medication Sig Dispense Refill  aspirin EC 81 MG tablet Take 1 tablet (81 mg total) by mouth daily.     atorvastatin (LIPITOR) 80 MG tablet Take 1 tablet (80 mg total) by mouth at bedtime. 90 tablet 3   bisoprolol (ZEBETA) 5 MG tablet Take 0.5 tablets (2.5 mg total) by mouth daily. 45 tablet 0   carbamazepine (TEGRETOL) 200 MG tablet Take 400 mg by mouth 2 (two) times daily.      divalproex (DEPAKOTE ER) 250 MG 24 hr tablet Take 250 mg by mouth daily.     EPINEPHrine 0.3 mg/0.3 mL IJ SOAJ injection Inject 0.3 mg into the muscle as needed for anaphylaxis. 1 each 0   famotidine (PEPCID) 40 MG tablet TAKE (1) TABLET BY MOUTH AT BEDTIME. 90 tablet 0   furosemide (LASIX)  40 MG tablet Take 0.5-1 tablets (20-40 mg total) by mouth daily. '20mg'$  one day then '40mg'$  the other day alternating '20mg'$ -'40mg'$  90 tablet 3   loperamide (IMODIUM) 2 MG capsule Take 1 capsule (2 mg total) by mouth every 6 (six) hours as needed for diarrhea or loose stools. 30 capsule 0   OVER THE COUNTER MEDICATION Vit B50 one daily     pantoprazole (PROTONIX) 40 MG tablet Take 1 tablet (40 mg total) by mouth daily before breakfast. 90 tablet 3   sacubitril-valsartan (ENTRESTO) 24-26 MG TAKE (1) TABLET BY MOUTH TWICE DAILY. 60 tablet 3   silodosin (RAPAFLO) 8 MG CAPS capsule Take 1 capsule (8 mg total) by mouth daily with breakfast. 30 capsule 11   temazepam (RESTORIL) 15 MG capsule Take 15 mg by mouth at bedtime as needed for sleep.     empagliflozin (JARDIANCE) 10 MG TABS tablet Take 1 tablet (10 mg total) by mouth daily before breakfast. (Patient not taking: Reported on 02/21/2022) 90 tablet 3   LORazepam (ATIVAN) 0.5 MG tablet Take 0.5 mg by mouth at bedtime. (Patient not taking: Reported on 01/19/2022)     tamsulosin (FLOMAX) 0.4 MG CAPS capsule Take 1 capsule (0.4 mg total) by mouth at bedtime. (Patient not taking: Reported on 01/19/2022) 90 capsule 3   vitamin B-12 (CYANOCOBALAMIN) 1000 MCG tablet Take 1 tablet (1,000 mcg total) by mouth daily. (Patient not taking: Reported on 02/21/2022) 30 tablet 0   No current facility-administered medications for this visit.    Allergies as of 02/21/2022 - Review Complete 02/21/2022  Allergen Reaction Noted   Spironolactone Other (See Comments) 10/07/2020    Family History  Problem Relation Age of Onset   Asthma Mother    Heart attack Father 13   Diabetes Brother     Social History   Socioeconomic History   Marital status: Widowed    Spouse name: Not on file   Number of children: Not on file   Years of education: Not on file   Highest education level: Not on file  Occupational History   Not on file  Tobacco Use   Smoking status: Former     Packs/day: 1.00    Years: 15.00    Total pack years: 15.00    Types: Cigarettes    Quit date: 01/11/1971    Years since quitting: 51.1    Passive exposure: Past   Smokeless tobacco: Never  Vaping Use   Vaping Use: Never used  Substance and Sexual Activity   Alcohol use: Not Currently    Comment: occ.   Drug use: No   Sexual activity: Not Currently  Other Topics Concern   Not on file  Social History Narrative  Married with no children    Exercises 4 times weekly   Caffeine once a day   Social Determinants of Health   Financial Resource Strain: Not on file  Food Insecurity: Not on file  Transportation Needs: Not on file  Physical Activity: Not on file  Stress: Not on file  Social Connections: Not on file    Review of systems General: negative for malaise, night sweats, fever, chills, weight loss Neck: Negative for lumps, goiter, pain and significant neck swelling Resp: Negative for cough, wheezing, dyspnea at rest CV: Negative for chest pain, leg swelling, palpitations, orthopnea GI: denies melena, hematochezia, nausea, vomiting, diarrhea, constipation, odynophagia, early satiety or unintentional weight loss. +dysphagia  MSK: Negative for joint pain or swelling, back pain, and muscle pain. Derm: Negative for itching or rash Psych: Denies depression, anxiety, memory loss, confusion. No homicidal or suicidal ideation.  Heme: Negative for prolonged bleeding, bruising easily, and swollen nodes. Endocrine: Negative for cold or heat intolerance, polyuria, polydipsia and goiter. Neuro: negative for tremor, gait imbalance, syncope and seizures. The remainder of the review of systems is noncontributory.  Physical Exam: BP 130/74 (BP Location: Left Arm, Patient Position: Sitting, Cuff Size: Large)   Pulse 69   Temp 98.5 F (36.9 C) (Oral)   Ht 6' (1.829 m)   Wt 226 lb 4.8 oz (102.6 kg)   BMI 30.69 kg/m  General:   Alert and oriented. No distress noted. Pleasant and  cooperative.  Head:  Normocephalic and atraumatic. Eyes:  Conjuctiva clear without scleral icterus. Mouth:  Oral mucosa pink and moist. Good dentition. No lesions. Heart: Normal rate and rhythm, s1 and s2 heart sounds present.  Lungs: Clear lung sounds in all lobes. Respirations equal and unlabored. Abdomen:  +BS, soft, non-tender and non-distended. No rebound or guarding. No HSM or masses noted. Derm: No palmar erythema or jaundice Msk:  Symmetrical without gross deformities. Normal posture. Extremities:  Without edema. Neurologic:  Alert and  oriented x4 Psych:  Alert and cooperative. Normal mood and affect.  Invalid input(s): "6 MONTHS"   ASSESSMENT: Grant Stevens is a 82 y.o. male presenting today for dysphagia.  Dysphagia: ongoing, last EGD without anatomical abnormalities to explain dysphagia, concern for esophageal dysmotility noted on EGD in 2020. Denies GERD symptoms on daily PPI and H2B. Having dysphagia with foods maybe 3-4x/week, no episodes of food impaction. Tends to do better if he takes small bite and eats slowly. Did not proceed with esophageal manometry as recommended at last visit. Will proceed with barium pill esophagram for further evaluation and like recommend esophageal manometry again pending results of BPE. Should continue with chewing precautions to include avoid thicker, drier foods, taking small bites, chewing thoroughly and taking sips of liquids between bites.   Constipation/stercoral colitis: recent ED visit for constipation/CT with stercoral colitis, did enema, senna and miralax and had resolution of constipation and rectal bleeding. Stools are soft and bowels are moving without issue. He is not currently taking anything but eating prunes. Encouraged to Increase water intake,  Increase fruits, veggies and whole grains, continue prunes with good result. He should restart miralax 1 capful daily at any sign of constipation.    PLAN:  Continue with chewing  precautions  2. Barium pill esophagram  3. Referral to [facility] for esophageal manometry pending BPE results 4. Continue PPI daily and H2B QHS 5. Increase water intake, Increase fruits, veggies and whole grains, continue with daily prunes 6. Miralax 1 capful if stools become harder again/skipping  a day without a BM.   All questions were answered, patient verbalized understanding and is in agreement with plan as outlined above. '   Follow Up: 3 months   Nolawi Kanady L. Alver Sorrow, MSN, APRN, AGNP-C Adult-Gerontology Nurse Practitioner [facility]  I have reviewed the note and agree with the APP's assessment as described in this progress note  [name], MD Gastroenterology and Hepatology [facility] G[facility]

## 2022-02-21 NOTE — Progress Notes (Addendum)
Referring Provider: [name], MD Primary Care Physician:  [name], MD Primary GI Physician: Jenetta Downer   Chief Complaint  Patient presents with   Dysphagia    Having trouble swallowing and choking on food. Choked this morning on a cookie. Went to ED on 2/9 for rectal bleeding and constipation.    HPI:   Grant Stevens is a 82 y.o. male with past medical history of localized bladder cancer s/p BCG injection, MI s/p CABG, asthma, BPH, cardiomyopathy, Heart failure, HTN, bipolar disorder, HLD, Barrett's esophagus, and IBS    Patient presenting today for ongoing dysphagia.   Last seen December 2023, continuing to have dysphagia, feels it may be worse than prior. Having to eat very slow, especially with meats. Issues occurring 2-3x/day. Sometimes has issues with pills as well. No issues with swallowing liquids. Sometimes substances can come right back up after he swallows, then will swallow again and food or pills will go down. Sometimes foods will sit in his mid to lower throat. Feels hoarse at time as well. Denies GERD symptoms. He is taking protonix but not everyday compliantly. Does not think he is taking pepcid in the evening.    Recent ED visit on 2/9 for constipation/rectal bleeding, CT Pelvis W contrast at that time with Large amount of stool throughout the colon with rectal wall thickening and surrounding inflammation worrisome for stercoral colitis.  Present:  Patient reports recent ER visit for constipation and some associated rectal bleeding with some blood noted in his underwear. CT as above with stercoral colitis.  Notes that after ED visit, he did the enema and was taking senna and miralax and finally had a Good BM. Has had no further rectal bleeding.He is not currently taking anything as his stools are soft now and he is moving his bowels daily. Denies any abdominal pain. Denies any melena. Notes water intake is not great, mostly drinking sodas. He is eating prunes.     Continues to have dysphagia, had some issues this morning around 3am he ate a cookie and failed to drink any liquids with it and the cookie got stuck, noting he had to go cough up the food as he could not get it to pass. Has had issues with a ham biscuit in the past where he had to call EMS but was eventually able to get the food up prior to them arriving. He notes that he tries to eat very slow and avoid certain foods so that he does not choke. Denies GERD symptoms, he is taking protonix 66m daily and pepcid 466mat night. Denies issues with liquids or pills going down. dysphagia symptoms occurring maybe  3-4x/week. At last visit he agreed to referral to [facility] for esophageal manometry but later declined to schedule the appt with them.   Last EGD:03/09/21- Esophageal mucosal changes consistent with short-segment Barrett's esophagus. Biopsied-1 cm w/o dysplasia - 2 cm hiatal hernia. - Discolored mucosa in the antrum. Biopsied-normal - Normal examined duodenum. Last Colonoscopy:2014 Examination performed to cecum. Quality of prep somewhat compromised examination of ascending colon and cecum. Redundant colon. Small polyp ablated via cold biopsy from proximal transverse colon - benign lymph node. Small external hemorrhoids   Past Medical History:  Diagnosis Date   Allergic rhinitis    Arthritis    Asthma    Childhood   [place]   Remote history - [facility]   Bladder cancer (HCC)    BPH (benign prostatic hyperplasia)    [place]  CHF (congestive heart failure) ([place])    a. EF 30-35% by echo in 2018 and 10/2018, at 25-30% in 05/2020   Coronary atherosclerosis of native coronary artery    Multivessel s/p CABG in 2002 post-IMI, LVEF 35% up to 50% postoperatively;   Depression    Difficulty sleeping    Erectile dysfunction    Essential hypertension    Frequency of urination    History of bipolar disorder    Hyperlipidemia    IBS (irritable bowel syndrome)     Myocardial infarction [facility]) 2002    Past Surgical History:  Procedure Laterality Date   BIOPSY  03/28/2018   Procedure: BIOPSY;  Surgeon: Rogene Houston, MD;  [facility];  Service: Endoscopy;;  esophagus   BIOPSY  03/09/2021   Procedure: BIOPSY;  Surgeon: Harvel Quale, MD;  [facility];  Service: Gastroenterology;;   BIV PACEMAKER INSERTION CRT-P N/A 05/12/2020   Procedure: BIV PACEMAKER INSERTION CRT-P;  Surgeon: Thompson Grayer, MD;  Location: [facility];  Service: Cardiovascular;  Laterality: N/A;   CATARACT EXTRACTION W/PHACO Left 12/29/2014   Procedure: CATARACT EXTRACTION PHACO AND INTRAOCULAR LENS PLACEMENT (IOC);  Surgeon: Williams Che, MD;  Location: [facility];  Service: Ophthalmology;  Laterality: Left;  CDE:3.71   CATARACT EXTRACTION W/PHACO Right 03/30/2015   Procedure: CATARACT EXTRACTION PHACO AND INTRAOCULAR LENS PLACEMENT RIGHT EYE CDE=2.56;  Surgeon: Williams Che, MD;  Location: [facility];  Service: Ophthalmology;  Laterality: Right;   CIRCUMCISION  10/11/2003   COLONOSCOPY N/A 07/11/2012   rehman,transverse colon polyp (benign lymphoid); tortuous colon; prep adequate with much suction/lavage; hemorrhoids.   CORONARY ARTERY BYPASS GRAFT  08/10/2000   Dr. Servando Snare - LIMA to LAD, SVG to diagonal, SVG to PDA TRIPLE BYPASS   CYSTOSCOPY W/ RETROGRADES Bilateral 03/02/2015   Procedure: CYSTOSCOPY WITH RIGHT RETROGRADE PYELOGRAM,  ATTEMPTED LEFT RETROGRADE PYELOGRAM;  Surgeon: Cleon Gustin, MD;  [facility]: [facility];  Service: Urology;  Laterality: Bilateral;   ESOPHAGEAL DILATION N/A 03/28/2018   Procedure: ESOPHAGEAL DILATION;  Surgeon: Rogene Houston, MD;  [facility];  Service: Endoscopy;  Laterality: N/A;   ESOPHAGOGASTRODUODENOSCOPY N/A 03/28/2018   rehman,Abnormal esophageal motility. mild schatzki ring at GEJ, dilated, patch of salmon colored mucosa at distal esophagus. Barrett's esophagus. 2cm HH. erosive  gastropathy, normal pylorus, duodenal erosions w/o bleeding. normal second portion of duodenum   ESOPHAGOGASTRODUODENOSCOPY (EGD) WITH PROPOFOL N/A 03/09/2021   Procedure: ESOPHAGOGASTRODUODENOSCOPY (EGD) WITH PROPOFOL;  Surgeon: Harvel Quale, MD;  [facility];  Service: Gastroenterology;  Laterality: N/A;  945   TEMPORARY PACEMAKER N/A 05/12/2020   Procedure: TEMPORARY PACEMAKER;  Surgeon: Martinique, Peter M, MD;  Location: [facility];  Service: Cardiovascular;  Laterality: N/A;   TONSILLECTOMY     TRANSURETHRAL RESECTION OF BLADDER TUMOR N/A 01/26/2015   Procedure: TRANSURETHRAL RESECTION OF BLADDER TUMOR (TURBT);  Surgeon: Cleon Gustin, MD;  [facility];  Service: Urology;  Laterality: N/A;   TRANSURETHRAL RESECTION OF BLADDER TUMOR WITH GYRUS (TURBT-GYRUS) N/A 03/02/2015   Procedure: TRANSURETHRAL RESECTION OF BLADDER TUMOR WITH GYRUS (TURBT-GYRUS);  Surgeon: Cleon Gustin, MD;  [facility];  Service: Urology;  Laterality: N/A;   TRANSURETHRAL RESECTION OF PROSTATE N/A 01/26/2015   Procedure: TRANSURETHRAL RESECTION OF THE PROSTATE WITH GYRUS INSTRUMENTS;  Surgeon: Cleon Gustin, MD;  [facility];  Service: Urology;  Laterality: N/A;    Current Outpatient Medications  Medication Sig Dispense Refill  aspirin EC 81 MG tablet Take 1 tablet (81 mg total) by mouth daily.     atorvastatin (LIPITOR) 80 MG tablet Take 1 tablet (80 mg total) by mouth at bedtime. 90 tablet 3   bisoprolol (ZEBETA) 5 MG tablet Take 0.5 tablets (2.5 mg total) by mouth daily. 45 tablet 0   carbamazepine (TEGRETOL) 200 MG tablet Take 400 mg by mouth 2 (two) times daily.      divalproex (DEPAKOTE ER) 250 MG 24 hr tablet Take 250 mg by mouth daily.     EPINEPHrine 0.3 mg/0.3 mL IJ SOAJ injection Inject 0.3 mg into the muscle as needed for anaphylaxis. 1 each 0   famotidine (PEPCID) 40 MG tablet TAKE (1) TABLET BY MOUTH AT BEDTIME. 90 tablet 0   furosemide (LASIX)  40 MG tablet Take 0.5-1 tablets (20-40 mg total) by mouth daily. 63m one day then 455mthe other day alternating 2034m0mg 90 tablet 3   loperamide (IMODIUM) 2 MG capsule Take 1 capsule (2 mg total) by mouth every 6 (six) hours as needed for diarrhea or loose stools. 30 capsule 0   OVER THE COUNTER MEDICATION Vit B50 one daily     pantoprazole (PROTONIX) 40 MG tablet Take 1 tablet (40 mg total) by mouth daily before breakfast. 90 tablet 3   sacubitril-valsartan (ENTRESTO) 24-26 MG TAKE (1) TABLET BY MOUTH TWICE DAILY. 60 tablet 3   silodosin (RAPAFLO) 8 MG CAPS capsule Take 1 capsule (8 mg total) by mouth daily with breakfast. 30 capsule 11   temazepam (RESTORIL) 15 MG capsule Take 15 mg by mouth at bedtime as needed for sleep.     empagliflozin (JARDIANCE) 10 MG TABS tablet Take 1 tablet (10 mg total) by mouth daily before breakfast. (Patient not taking: Reported on 02/21/2022) 90 tablet 3   LORazepam (ATIVAN) 0.5 MG tablet Take 0.5 mg by mouth at bedtime. (Patient not taking: Reported on 01/19/2022)     tamsulosin (FLOMAX) 0.4 MG CAPS capsule Take 1 capsule (0.4 mg total) by mouth at bedtime. (Patient not taking: Reported on 01/19/2022) 90 capsule 3   vitamin B-12 (CYANOCOBALAMIN) 1000 MCG tablet Take 1 tablet (1,000 mcg total) by mouth daily. (Patient not taking: Reported on 02/21/2022) 30 tablet 0   No current facility-administered medications for this visit.    Allergies as of 02/21/2022 - Review Complete 02/21/2022  Allergen Reaction Noted   Spironolactone Other (See Comments) 10/07/2020    Family History  Problem Relation Age of Onset   Asthma Mother    Heart attack Father 67 80Diabetes Brother     Social History   Socioeconomic History   Marital status: Widowed    Spouse name: Not on file   Number of children: Not on file   Years of education: Not on file   Highest education level: Not on file  Occupational History   Not on file  Tobacco Use   Smoking status: Former     Packs/day: 1.00    Years: 15.00    Total pack years: 15.00    Types: Cigarettes    Quit date: 01/11/1971    Years since quitting: 51.1    Passive exposure: Past   Smokeless tobacco: Never  Vaping Use   Vaping Use: Never used  Substance and Sexual Activity   Alcohol use: Not Currently    Comment: occ.   Drug use: No   Sexual activity: Not Currently  Other Topics Concern   Not on file  Social History Narrative  Married with no children    Exercises 4 times weekly   Caffeine once a day   Social Determinants of Health   Financial Resource Strain: Not on file  Food Insecurity: Not on file  Transportation Needs: Not on file  Physical Activity: Not on file  Stress: Not on file  Social Connections: Not on file    Review of systems General: negative for malaise, night sweats, fever, chills, weight loss Neck: Negative for lumps, goiter, pain and significant neck swelling Resp: Negative for cough, wheezing, dyspnea at rest CV: Negative for chest pain, leg swelling, palpitations, orthopnea GI: denies melena, hematochezia, nausea, vomiting, diarrhea, constipation, odynophagia, early satiety or unintentional weight loss. +dysphagia  MSK: Negative for joint pain or swelling, back pain, and muscle pain. Derm: Negative for itching or rash Psych: Denies depression, anxiety, memory loss, confusion. No homicidal or suicidal ideation.  Heme: Negative for prolonged bleeding, bruising easily, and swollen nodes. Endocrine: Negative for cold or heat intolerance, polyuria, polydipsia and goiter. Neuro: negative for tremor, gait imbalance, syncope and seizures. The remainder of the review of systems is noncontributory.  Physical Exam: BP 130/74 (BP Location: Left Arm, Patient Position: Sitting, Cuff Size: Large)   Pulse 69   Temp 98.5 F (36.9 C) (Oral)   Ht 6' (1.829 m)   Wt 226 lb 4.8 oz (102.6 kg)   BMI 30.69 kg/m  General:   Alert and oriented. No distress noted. Pleasant and  cooperative.  Head:  Normocephalic and atraumatic. Eyes:  Conjuctiva clear without scleral icterus. Mouth:  Oral mucosa pink and moist. Good dentition. No lesions. Heart: Normal rate and rhythm, s1 and s2 heart sounds present.  Lungs: Clear lung sounds in all lobes. Respirations equal and unlabored. Abdomen:  +BS, soft, non-tender and non-distended. No rebound or guarding. No HSM or masses noted. Derm: No palmar erythema or jaundice Msk:  Symmetrical without gross deformities. Normal posture. Extremities:  Without edema. Neurologic:  Alert and  oriented x4 Psych:  Alert and cooperative. Normal mood and affect.  Invalid input(s): "6 MONTHS"   ASSESSMENT: Grant Stevens is a 82 y.o. male presenting today for dysphagia.  Dysphagia: ongoing, last EGD without anatomical abnormalities to explain dysphagia, concern for esophageal dysmotility noted on EGD in 2020. Denies GERD symptoms on daily PPI and H2B. Having dysphagia with foods maybe 3-4x/week, no episodes of food impaction. Tends to do better if he takes small bite and eats slowly. Did not proceed with esophageal manometry as recommended at last visit. Will proceed with barium pill esophagram for further evaluation and like recommend esophageal manometry again pending results of BPE. Should continue with chewing precautions to include avoid thicker, drier foods, taking small bites, chewing thoroughly and taking sips of liquids between bites.   Constipation/stercoral colitis: recent ED visit for constipation/CT with stercoral colitis, did enema, senna and miralax and had resolution of constipation and rectal bleeding. Stools are soft and bowels are moving without issue. He is not currently taking anything but eating prunes. Encouraged to Increase water intake,  Increase fruits, veggies and whole grains, continue prunes with good result. He should restart miralax 1 capful daily at any sign of constipation.    PLAN:  Continue with chewing  precautions  2. Barium pill esophagram  3. Referral to [facility] for esophageal manometry pending BPE results 4. Continue PPI daily and H2B QHS 5. Increase water intake, Increase fruits, veggies and whole grains, continue with daily prunes 6. Miralax 1 capful if stools become harder again/skipping  a day without a BM.   All questions were answered, patient verbalized understanding and is in agreement with plan as outlined above. '   Follow Up: 3 months   Anapaula Severt L. Alver Sorrow, MSN, APRN, AGNP-C Adult-Gerontology Nurse Practitioner [facility]  I have reviewed the note and agree with the APP's assessment as described in this progress note  [name], MD Gastroenterology and Hepatology [facility]astroenter[facility]

## 2022-02-21 NOTE — Patient Instructions (Addendum)
Please continue with protonix 54m once daily and pepcid 453min the evening Continue to avoid thicker, drier foods, take small bites, chew thoroughly and take sips of liquids between bites We will schedule you for a barium pill study to further evaluate your swallowing I may still refer you to [facility] for esophageal manometry thereafter, I will be in touch once I have these results  For your constipation, Increase water intake  Increase fruits, veggies and whole grains, kiwi and prunes are especially good for constipation If stools are becoming harder or you skip a day without a BM, please restart Miralax 1 capful daily   It was a pleasure to see you today. I want to create trusting relationships with patients and provide genuine, compassionate, and quality care. I truly value your feedback! please be on the lookout for a survey regarding your visit with me today. I appreciate your input about our visit and your time in completing this!    Krishav Mamone L. CaAlver SorrowMSN, APRN, AGNP-C Adult-Gerontology Nurse Practitioner [facility] Follow up 3 months

## 2022-02-24 ENCOUNTER — Ambulatory Visit (HOSPITAL_COMMUNITY)
Admission: RE | Admit: 2022-02-24 | Discharge: 2022-02-24 | Disposition: A | Discharge status: Home / Self Care | Payer: Medicare HMO | Source: Ambulatory Visit | Attending: Gastroenterology | Admitting: Gastroenterology

## 2022-02-24 DIAGNOSIS — R1310 Dysphagia, unspecified: Secondary | ICD-10-CM | POA: Insufficient documentation

## 2022-02-28 ENCOUNTER — Emergency Department (HOSPITAL_COMMUNITY)
Admission: EM | Admit: 2022-02-28 | Discharge: 2022-02-28 | Disposition: A | Discharge status: Home / Self Care | Payer: Medicare HMO | Attending: Student | Admitting: Student

## 2022-02-28 ENCOUNTER — Encounter (HOSPITAL_COMMUNITY): Payer: Self-pay

## 2022-02-28 ENCOUNTER — Other Ambulatory Visit: Payer: Self-pay

## 2022-02-28 DIAGNOSIS — K5900 Constipation, unspecified: Secondary | ICD-10-CM | POA: Diagnosis not present

## 2022-02-28 DIAGNOSIS — Z7982 Long term (current) use of aspirin: Secondary | ICD-10-CM | POA: Diagnosis not present

## 2022-02-28 DIAGNOSIS — K625 Hemorrhage of anus and rectum: Secondary | ICD-10-CM | POA: Insufficient documentation

## 2022-02-28 LAB — BASIC METABOLIC PANEL
Anion gap: 7 (ref 5–15)
BUN: 18 mg/dL (ref 8–23)
CO2: 27 mmol/L (ref 22–32)
Calcium: 8.6 mg/dL — ABNORMAL LOW (ref 8.9–10.3)
Chloride: 102 mmol/L (ref 98–111)
Creatinine, Ser: 1.11 mg/dL (ref 0.61–1.24)
GFR, Estimated: >60 mL/min (ref >60)
Glucose, Bld: 89 mg/dL (ref 70–99)
Potassium: 4.5 mmol/L (ref 3.5–5.1)
Sodium: 136 mmol/L (ref 135–145)

## 2022-02-28 LAB — CBC WITH DIFFERENTIAL/PLATELET
Abs Immature Granulocytes: 0.03 10*3/uL (ref 0.00–0.07)
Basophils Absolute: 0 10*3/uL (ref 0.0–0.1)
Basophils Relative: 0 %
Eosinophils Absolute: 0.1 10*3/uL (ref 0.0–0.5)
Eosinophils Relative: 1 %
HCT: 35.4 % — ABNORMAL LOW (ref 39.0–52.0)
Hemoglobin: 11.3 g/dL — ABNORMAL LOW (ref 13.0–17.0)
Immature Granulocytes: 0 %
Lymphocytes Relative: 6 %
Lymphs Abs: 0.6 10*3/uL — ABNORMAL LOW (ref 0.7–4.0)
MCH: 31 pg (ref 26.0–34.0)
MCHC: 31.9 g/dL (ref 30.0–36.0)
MCV: 97 fL (ref 80.0–100.0)
Monocytes Absolute: 0.7 10*3/uL (ref 0.1–1.0)
Monocytes Relative: 7 %
Neutro Abs: 8 10*3/uL — ABNORMAL HIGH (ref 1.7–7.7)
Neutrophils Relative %: 86 %
Platelets: 113 10*3/uL — ABNORMAL LOW (ref 150–400)
RBC: 3.65 MIL/uL — ABNORMAL LOW (ref 4.22–5.81)
RDW: 14 % (ref 11.5–15.5)
WBC: 9.4 10*3/uL (ref 4.0–10.5)
nRBC: 0 % (ref 0.0–0.2)

## 2022-02-28 MED ORDER — GLYCERIN (ADULT) 2 G RE SUPP: 1.0000 | RECTAL | 0 refills | Status: DC | PRN

## 2022-02-28 NOTE — ED Triage Notes (Signed)
Patient reports having blood in stool when he has a hard bm.  Was seen here recently for same.  Reports blood is only when he wipes.

## 2022-02-28 NOTE — Discharge Instructions (Addendum)
Continue taking your MiraLAX as needed to avoid constipation and needing to strain to have a bowel movement.  This may mean taking 1 capful every day and 8 ounces cup of water, some people need to take it more often, take the dose that works for you as discussed.  Return here if you develop any increased bleeding, a small amount on the toilet tissue is not alarming, but if you pass enough blood where it colors the toilet water you should return here for recheck of your symptoms.  Otherwise plan to proceed with the follow-up tests that are being arranged by your gastroenterologist.  You may also try glycerin suppositories which can help soften hard stool, this has been prescribed.

## 2022-02-28 NOTE — ED Provider Notes (Signed)
[facility] Provider Note   CSN: [id number] Arrival date & time: 02/28/22  1037     History  Chief Complaint  Patient presents with   Rectal Bleeding    Grant Stevens is a 82 y.o. male presenting for evaluation of rectal bleeding.  He was seen for the same complaint here 10 days ago which time he had significant constipation and CT imaging was revealing for significant constipation and stercoral colitis secondary to constipation.  He has since treated himself at home with enema, senna and MiraLAX after which his stools became significantly soft.  He stopped these medicines for the past several days and is now constipated again.  He had a hard stool this morning and noted blood on the toilet tissue when he wiped hence his repeat presentation here.  He had a follow-up with his GI specialist after his last ED visit and a barium pill esophagram has been ordered, he was also started on a PPI and H2 blockers.  He was advised to continue taking his MiraLAX.  Patient denies abdominal pain, nausea or vomiting, denies rectal pain.  He denies seeing a great quantity of blood, just on the toilet tissue when he wipes.  He does take a baby aspirin, no other blood thinning medications.  The history is provided by the patient.       Home Medications Prior to Admission medications   Medication Sig Start Date End Date Taking? Authorizing Provider  glycerin adult 2 g suppository Place 1 suppository rectally as needed for constipation. 02/28/22  Yes Jeanne Terrance, Almyra Free, PA-C  aspirin EC 81 MG tablet Take 1 tablet (81 mg total) by mouth daily. 03/31/18   Rehman, Mechele Dawley, MD  atorvastatin (LIPITOR) 80 MG tablet Take 1 tablet (80 mg total) by mouth at bedtime. 01/19/22   [name], MD  bisoprolol (ZEBETA) 5 MG tablet Take 0.5 tablets (2.5 mg total) by mouth daily. 01/11/22   [name], MD  carbamazepine (TEGRETOL) 200 MG tablet Take 400 mg by mouth 2 (two)  times daily.     [provider]  divalproex (DEPAKOTE ER) 250 MG 24 hr tablet Take 250 mg by mouth daily. 03/12/18   [provider]  EPINEPHrine 0.3 mg/0.3 mL IJ SOAJ injection Inject 0.3 mg into the muscle as needed for anaphylaxis. 09/13/21   Orpah Greek, MD  famotidine (PEPCID) 40 MG tablet TAKE (1) TABLET BY MOUTH AT BEDTIME. 11/22/21   Carlan, Chelsea L, NP  furosemide (LASIX) 40 MG tablet Take 0.5-1 tablets (20-40 mg total) by mouth daily. 19m one day then 411mthe other day alternating 2090m0mg 01/28/22   McDSatira SarkD  loperamide (IMODIUM) 2 MG capsule Take 1 capsule (2 mg total) by mouth every 6 (six) hours as needed for diarrhea or loose stools. 07/16/21   MemKathie DikeD  OVER THE COUNTER MEDICATION Vit B50 one daily    [provider]  pantoprazole (PROTONIX) 40 MG tablet Take 1 tablet (40 mg total) by mouth daily before breakfast. 12/16/21   Carlan, Chelsea L, NP  sacubitril-valsartan (ENTRESTO) 24-26 MG TAKE (1) TABLET BY MOUTH TWICE DAILY. 12/01/21   McDSatira SarkD  silodosin (RAPAFLO) 8 MG CAPS capsule Take 1 capsule (8 mg total) by mouth daily with breakfast. 01/28/22   McKenzie, PatCandee FurbishD  tamsulosin (FLOMAX) 0.4 MG CAPS capsule Take 1 capsule (0.4 mg total) by mouth at bedtime. Patient not taking: Reported on  01/19/2022 07/07/20   Cleon Gustin, MD      Allergies    Spironolactone    Review of Systems   Review of Systems  Constitutional:  Negative for chills and fever.  HENT:  Negative for congestion and sore throat.   Eyes: Negative.   Respiratory:  Negative for chest tightness and shortness of breath.   Cardiovascular:  Negative for chest pain.  Gastrointestinal:  Positive for anal bleeding and constipation. Negative for abdominal pain, nausea and vomiting.  Genitourinary: Negative.   Musculoskeletal:  Negative for arthralgias, joint swelling and neck pain.  Skin: Negative.  Negative for rash and wound.   Neurological:  Negative for dizziness, weakness, light-headedness, numbness and headaches.  Psychiatric/Behavioral: Negative.    All other systems reviewed and are negative.   Physical Exam Updated Vital Signs BP (!) 117/58 (BP Location: Right Arm)   Pulse (!) 59   Temp 98.4 F (36.9 C)   Resp 20   Ht 6' (1.829 m)   Wt 102.5 kg   SpO2 98%   BMI 30.65 kg/m  Physical Exam Vitals and nursing note reviewed.  Constitutional:      Appearance: He is well-developed.  HENT:     Head: Normocephalic and atraumatic.  Eyes:     Conjunctiva/sclera: Conjunctivae normal.  Cardiovascular:     Rate and Rhythm: Normal rate and regular rhythm.     Heart sounds: Normal heart sounds.  Pulmonary:     Effort: Pulmonary effort is normal.     Breath sounds: Normal breath sounds. No wheezing.  Abdominal:     General: Bowel sounds are normal.     Palpations: Abdomen is soft.     Tenderness: There is no abdominal tenderness. There is no guarding.  Musculoskeletal:        General: Normal range of motion.     Cervical back: Normal range of motion.  Skin:    General: Skin is warm and dry.  Neurological:     Mental Status: He is alert.     ED Results / Procedures / Treatments   Labs (all labs ordered are listed, but only abnormal results are displayed) Labs Reviewed  CBC WITH DIFFERENTIAL/PLATELET - Abnormal; Notable for the following components:      Result Value   RBC 3.65 (*)    Hemoglobin 11.3 (*)    HCT 35.4 (*)    Platelets 113 (*)    Neutro Abs 8.0 (*)    Lymphs Abs 0.6 (*)    All other components within normal limits  BASIC METABOLIC PANEL - Abnormal; Notable for the following components:   Calcium 8.6 (*)    All other components within normal limits    EKG None  Radiology No results found.  Procedures Procedures    Medications Ordered in ED Medications - No data to display  ED Course/ Medical Decision Making/ A&P                             Medical Decision  Making Patient presenting with scant blood on the toilet tissue per patient's report after passage of a hard stool.  He has had recent constipation problems, had been taking MiraLAX but stopped the past several days after his stools became significantly softer.  No abdominal pain, no lightheadedness, otherwise feels well.  His pending further GI evaluation as an outpatient.  He was encouraged to continue taking his MiraLAX daily keeping his stools soft.  Return precautions were outlined including any significant increase in bleeding or straining despite use of MiraLAX.  Amount and/or Complexity of Data Reviewed Labs: ordered.    Details: Labs today are stable including hemoglobin of 11.3, unchanged from prior recent checks.           Final Clinical Impression(s) / ED Diagnoses Final diagnoses:  Rectal bleeding  Constipation, unspecified constipation type    Rx / DC Orders ED Discharge Orders          Ordered    glycerin adult 2 g suppository  As needed        02/28/22 1607              Evalee Jefferson, Hershal Coria 02/28/22 1607    Kommor, [place], MD 03/01/22 1310

## 2022-03-01 ENCOUNTER — Encounter (INDEPENDENT_AMBULATORY_CARE_PROVIDER_SITE_OTHER): Payer: Self-pay | Admitting: *Deleted

## 2022-03-01 DIAGNOSIS — F3110 Bipolar disorder, current episode manic without psychotic features, unspecified: Secondary | ICD-10-CM | POA: Diagnosis not present

## 2022-03-03 ENCOUNTER — Ambulatory Visit (HOSPITAL_COMMUNITY)
Admission: RE | Admit: 2022-03-03 | Discharge: 2022-03-03 | Disposition: A | Discharge status: Home / Self Care | Payer: Medicare HMO | Source: Ambulatory Visit | Attending: Cardiology | Admitting: Cardiology

## 2022-03-03 DIAGNOSIS — I255 Ischemic cardiomyopathy: Secondary | ICD-10-CM | POA: Diagnosis not present

## 2022-03-03 LAB — ECHOCARDIOGRAM COMPLETE
AR max vel: 1.67 cm2
AV Area VTI: 1.49 cm2
AV Area mean vel: 1.71 cm2
AV Mean grad: 8 mmHg
AV Peak grad: 14.4 mmHg
Ao pk vel: 1.9 m/s
Area-P 1/2: 4.31 cm2
Calc EF: 31.2 %
S' Lateral: 5.8 cm
Single Plane A2C EF: 26.8 %
Single Plane A4C EF: 32 %

## 2022-03-03 NOTE — Progress Notes (Signed)
Remote pacemaker transmission.   

## 2022-03-03 NOTE — Progress Notes (Signed)
*  PRELIMINARY RESULTS* Echocardiogram 2D Echocardiogram has been performed.  Grant Stevens 03/03/2022, 10:12 AM

## 2022-03-07 ENCOUNTER — Telehealth: Payer: Self-pay

## 2022-03-07 ENCOUNTER — Telehealth (INDEPENDENT_AMBULATORY_CARE_PROVIDER_SITE_OTHER): Payer: Self-pay | Admitting: *Deleted

## 2022-03-07 NOTE — Telephone Encounter (Signed)
Pt called back to schedule EGD +/-DIL with Dr.Castaneda, ASA 3 on 3/5 at 1045am. DISCUSSED EGD instructions in detail with pt.  PA approved via cohere. Auth# [id number], DOS: 03/15/2022 - 05/15/2022

## 2022-03-07 NOTE — Telephone Encounter (Signed)
        Patient  visited [facility] on 2/19    Telephone encounter attempt :  1st  A HIPAA compliant voice message was left requesting a return call.  Instructed patient to call back     Markisha Meding [facility] Guide, [place] [phone] [address], [place], [place] [address] Phone: [phone] Email: [email or web address]       

## 2022-03-07 NOTE — Telephone Encounter (Signed)
Spoke with pt and he is aware of pre-op appointment

## 2022-03-08 ENCOUNTER — Other Ambulatory Visit (INDEPENDENT_AMBULATORY_CARE_PROVIDER_SITE_OTHER): Payer: Self-pay

## 2022-03-08 ENCOUNTER — Telehealth: Payer: Self-pay

## 2022-03-08 DIAGNOSIS — K219 Gastro-esophageal reflux disease without esophagitis: Secondary | ICD-10-CM

## 2022-03-08 MED ORDER — FAMOTIDINE 40 MG PO TABS: ORAL_TABLET | ORAL | 1 refills | Status: DC

## 2022-03-08 NOTE — Telephone Encounter (Signed)
     Patient  visit on 2/19  at [facility] you been able to follow up with your primary care physician? No  The patient was or was not able to obtain any needed medicine or equipment. Yes   Are there diet recommendations that you are having difficulty following? Na   Patient expresses understanding of discharge instructions and education provided has no other needs at this time.  Yes      [facility] [phone] [address], [place], [place] [address] Phone: [phone] Email: [email or web address]

## 2022-03-10 ENCOUNTER — Encounter: Payer: Self-pay | Admitting: Radiology

## 2022-03-10 DIAGNOSIS — I5022 Chronic systolic (congestive) heart failure: Secondary | ICD-10-CM | POA: Diagnosis not present

## 2022-03-10 DIAGNOSIS — I2510 Atherosclerotic heart disease of native coronary artery without angina pectoris: Secondary | ICD-10-CM | POA: Diagnosis not present

## 2022-03-14 ENCOUNTER — Encounter (HOSPITAL_COMMUNITY): Payer: Self-pay

## 2022-03-14 ENCOUNTER — Other Ambulatory Visit: Payer: Self-pay

## 2022-03-14 ENCOUNTER — Encounter (HOSPITAL_COMMUNITY)
Admission: RE | Admit: 2022-03-14 | Discharge: 2022-03-14 | Disposition: A | Discharge status: Home / Self Care | Payer: Medicare HMO | Source: Ambulatory Visit | Attending: Gastroenterology | Admitting: Gastroenterology

## 2022-03-15 ENCOUNTER — Encounter (HOSPITAL_COMMUNITY)
Admission: RE | Disposition: A | Discharge status: Home / Self Care | Payer: Self-pay | Source: Ambulatory Visit | Attending: Gastroenterology

## 2022-03-15 ENCOUNTER — Encounter (HOSPITAL_COMMUNITY): Payer: Self-pay | Admitting: Gastroenterology

## 2022-03-15 ENCOUNTER — Other Ambulatory Visit: Payer: Self-pay

## 2022-03-15 ENCOUNTER — Ambulatory Visit (HOSPITAL_COMMUNITY): Payer: Medicare HMO | Admitting: Registered Nurse

## 2022-03-15 ENCOUNTER — Ambulatory Visit (HOSPITAL_COMMUNITY)
Admission: RE | Admit: 2022-03-15 | Discharge: 2022-03-15 | Disposition: A | Discharge status: Home / Self Care | Payer: Medicare HMO | Source: Ambulatory Visit | Attending: Gastroenterology | Admitting: Gastroenterology

## 2022-03-15 ENCOUNTER — Ambulatory Visit (HOSPITAL_BASED_OUTPATIENT_CLINIC_OR_DEPARTMENT_OTHER): Payer: Medicare HMO | Admitting: Registered Nurse

## 2022-03-15 DIAGNOSIS — F319 Bipolar disorder, unspecified: Secondary | ICD-10-CM | POA: Insufficient documentation

## 2022-03-15 DIAGNOSIS — K222 Esophageal obstruction: Secondary | ICD-10-CM

## 2022-03-15 DIAGNOSIS — Z8551 Personal history of malignant neoplasm of bladder: Secondary | ICD-10-CM | POA: Insufficient documentation

## 2022-03-15 DIAGNOSIS — I509 Heart failure, unspecified: Secondary | ICD-10-CM | POA: Diagnosis not present

## 2022-03-15 DIAGNOSIS — R1310 Dysphagia, unspecified: Secondary | ICD-10-CM | POA: Diagnosis not present

## 2022-03-15 DIAGNOSIS — E785 Hyperlipidemia, unspecified: Secondary | ICD-10-CM | POA: Diagnosis not present

## 2022-03-15 DIAGNOSIS — I110 Hypertensive heart disease with heart failure: Secondary | ICD-10-CM | POA: Diagnosis not present

## 2022-03-15 DIAGNOSIS — Z87891 Personal history of nicotine dependence: Secondary | ICD-10-CM | POA: Insufficient documentation

## 2022-03-15 DIAGNOSIS — I429 Cardiomyopathy, unspecified: Secondary | ICD-10-CM | POA: Insufficient documentation

## 2022-03-15 DIAGNOSIS — K449 Diaphragmatic hernia without obstruction or gangrene: Secondary | ICD-10-CM

## 2022-03-15 DIAGNOSIS — Z951 Presence of aortocoronary bypass graft: Secondary | ICD-10-CM | POA: Insufficient documentation

## 2022-03-15 DIAGNOSIS — J45909 Unspecified asthma, uncomplicated: Secondary | ICD-10-CM | POA: Diagnosis not present

## 2022-03-15 DIAGNOSIS — N400 Enlarged prostate without lower urinary tract symptoms: Secondary | ICD-10-CM | POA: Diagnosis not present

## 2022-03-15 DIAGNOSIS — R1319 Other dysphagia: Secondary | ICD-10-CM | POA: Diagnosis not present

## 2022-03-15 DIAGNOSIS — K2289 Other specified disease of esophagus: Secondary | ICD-10-CM | POA: Diagnosis not present

## 2022-03-15 DIAGNOSIS — I2510 Atherosclerotic heart disease of native coronary artery without angina pectoris: Secondary | ICD-10-CM

## 2022-03-15 DIAGNOSIS — Q399 Congenital malformation of esophagus, unspecified: Secondary | ICD-10-CM | POA: Diagnosis not present

## 2022-03-15 DIAGNOSIS — Z8719 Personal history of other diseases of the digestive system: Secondary | ICD-10-CM | POA: Diagnosis not present

## 2022-03-15 DIAGNOSIS — G4730 Sleep apnea, unspecified: Secondary | ICD-10-CM | POA: Diagnosis not present

## 2022-03-15 DIAGNOSIS — I252 Old myocardial infarction: Secondary | ICD-10-CM | POA: Diagnosis not present

## 2022-03-15 DIAGNOSIS — K589 Irritable bowel syndrome without diarrhea: Secondary | ICD-10-CM | POA: Insufficient documentation

## 2022-03-15 DIAGNOSIS — Z79899 Other long term (current) drug therapy: Secondary | ICD-10-CM | POA: Insufficient documentation

## 2022-03-15 HISTORY — PX: ESOPHAGEAL DILATION: SHX303

## 2022-03-15 HISTORY — PX: ESOPHAGOGASTRODUODENOSCOPY (EGD) WITH PROPOFOL: SHX5813

## 2022-03-15 IMAGING — CT CT ABD-PELV W/ CM
2 of 5 series · 17 of 46 positions shown, 19 images · IV contrast (Omnipaque or Isovue)
Comparison: 02/20/2015

CLINICAL DATA: Dizziness with diarrhea.

EXAM:
CT ABDOMEN AND PELVIS WITH CONTRAST
TECHNIQUE: Multidetector CT imaging of the abdomen and pelvis was performed
using the standard protocol following bolus administration of
intravenous contrast.
CONTRAST:  100mL OMNIPAQUE IOHEXOL 300 MG/ML  SOLN

[Series 2: axial st · axial · 0.85mm/px · z∈[+891,+1346]mm · 14 of 103 slices shown, 16 images]
[im 6/103  soft-tissue]
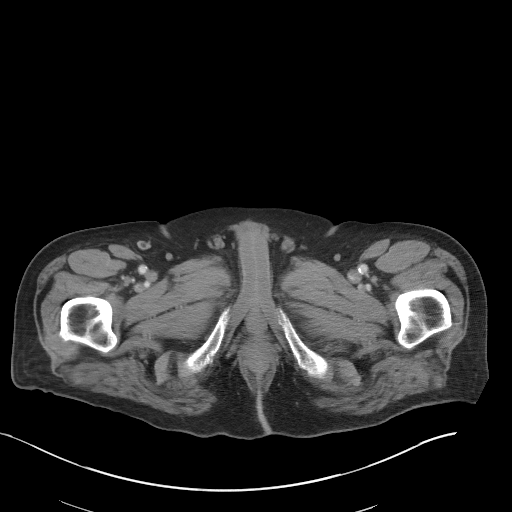
[im 6/103  bone]
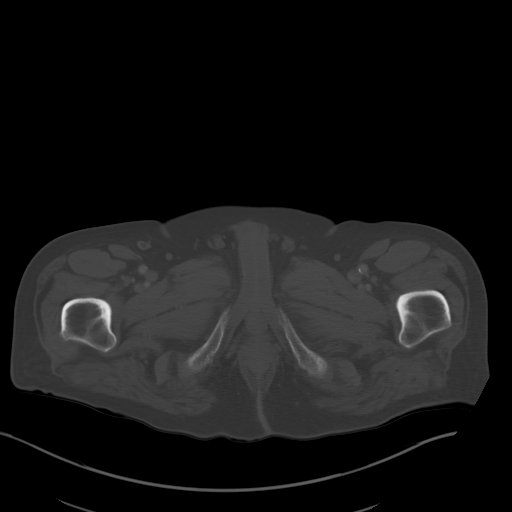
[im 12/103  soft-tissue]
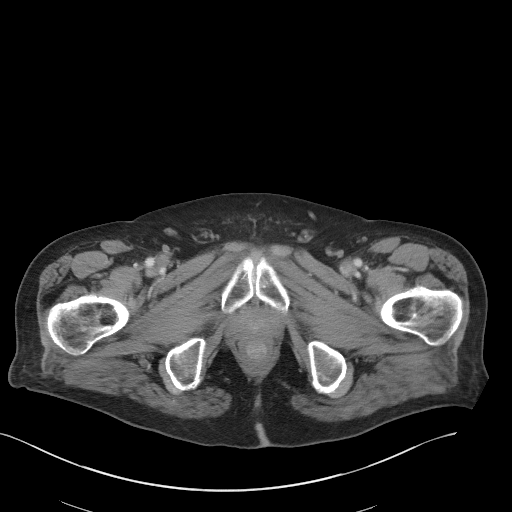
[im 23/103  soft-tissue]
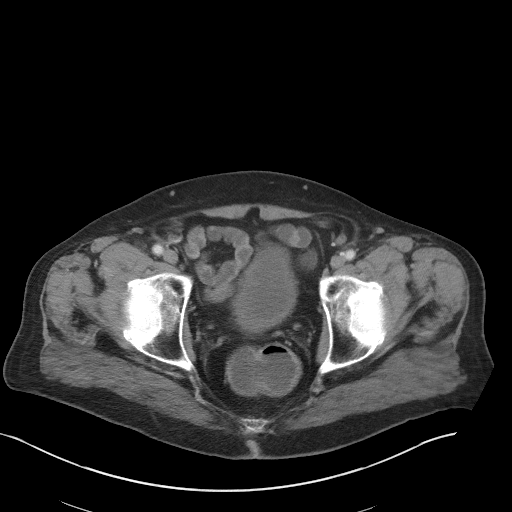
[im 29/103  soft-tissue]
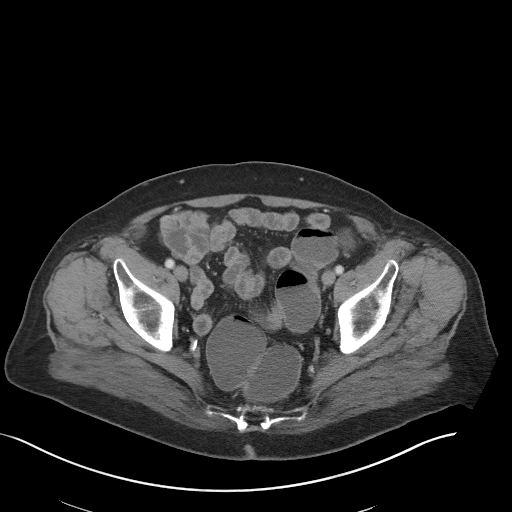
[im 35/103  soft-tissue]
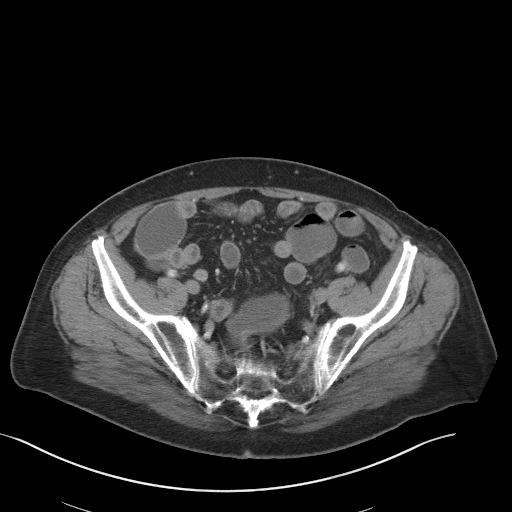
[im 40/103  soft-tissue]
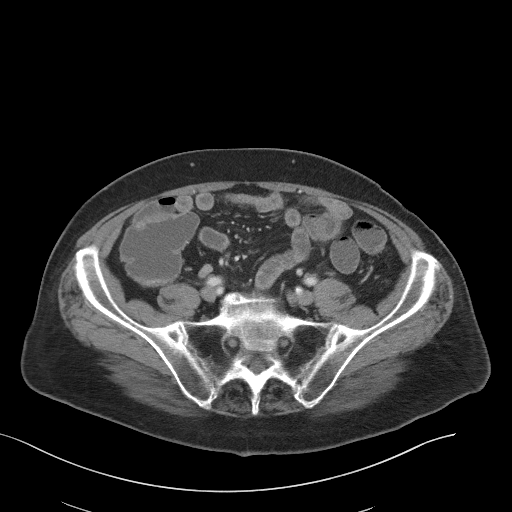
[im 46/103  soft-tissue]
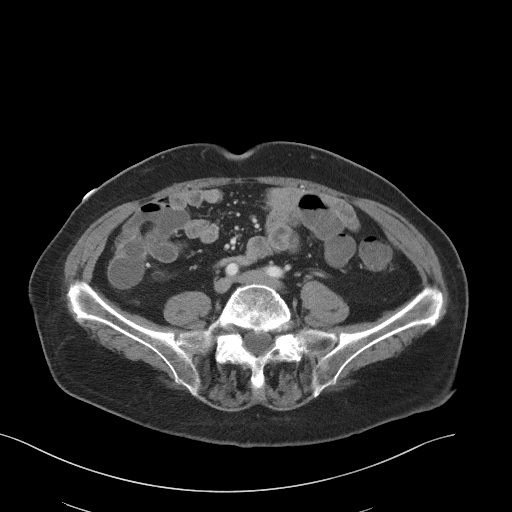
[im 57/103  soft-tissue]
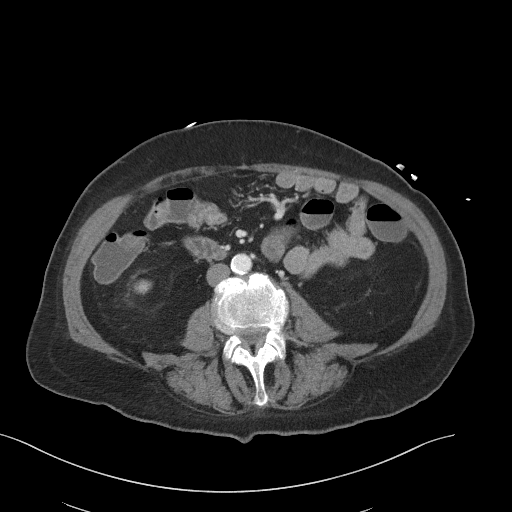
[im 63/103  soft-tissue]
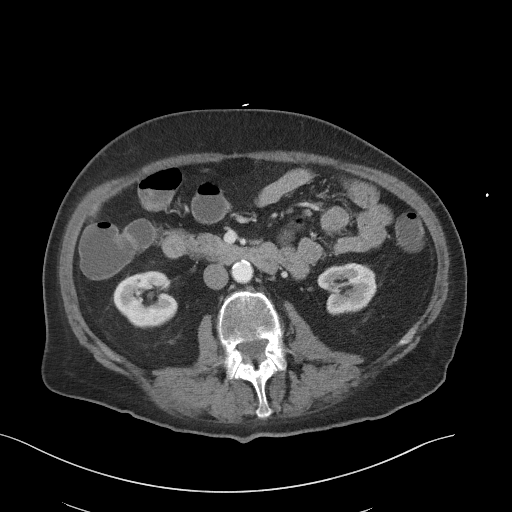
[im 63/103  bone]
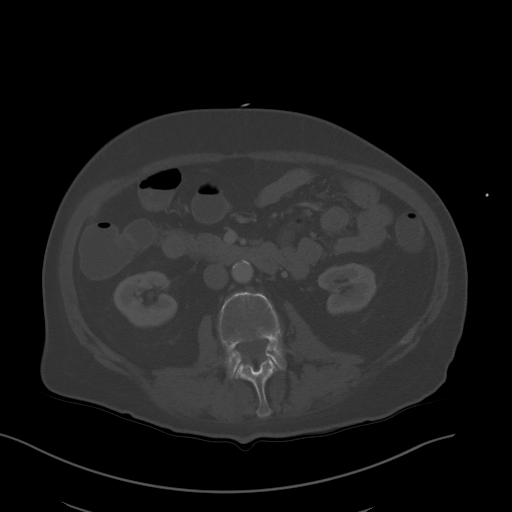
[im 69/103  soft-tissue]
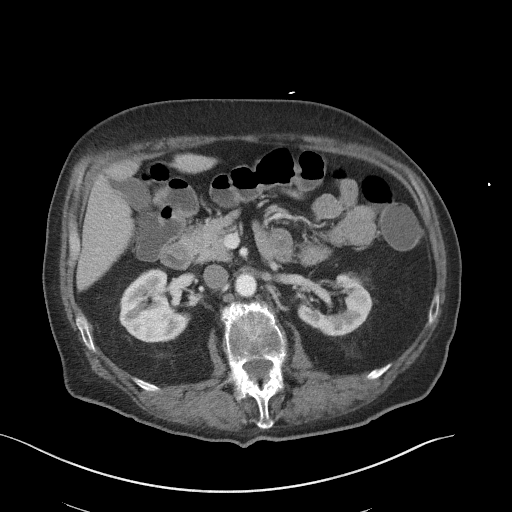
[im 74/103  soft-tissue]
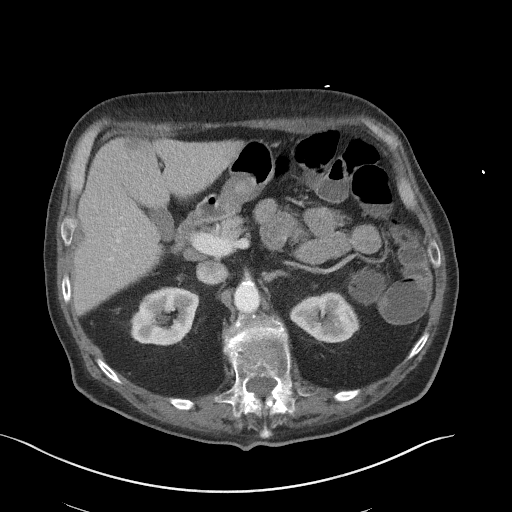
[im 80/103  soft-tissue]
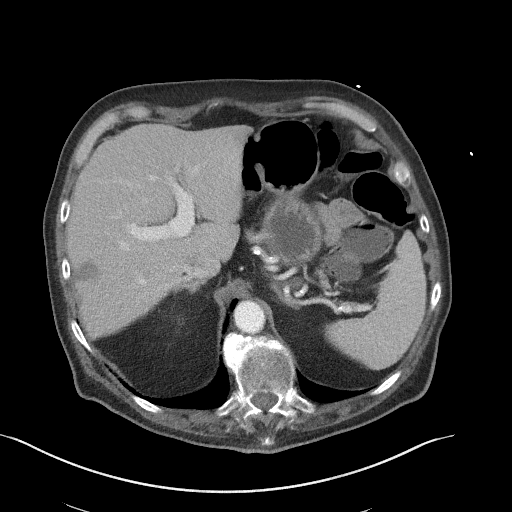
[im 91/103  soft-tissue]
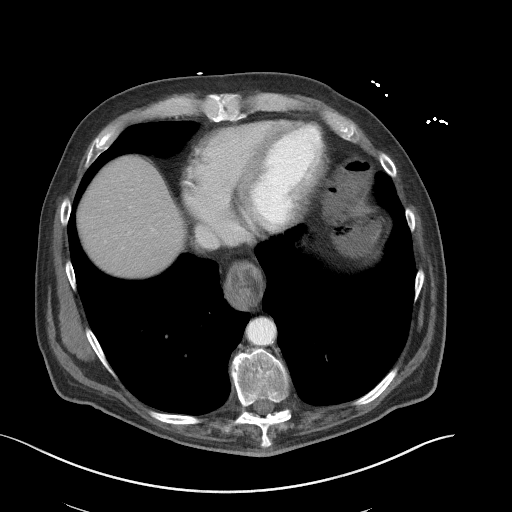
[im 97/103  soft-tissue]
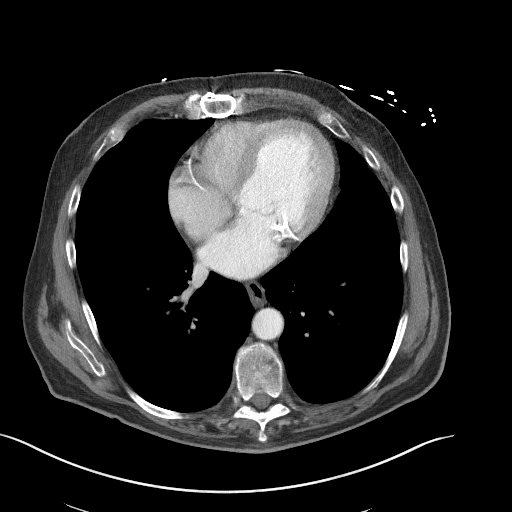

[Series 6: coronal st · coronal · 0.89mm/px · 3 of 115 slices shown]
[im 39/115  soft-tissue]
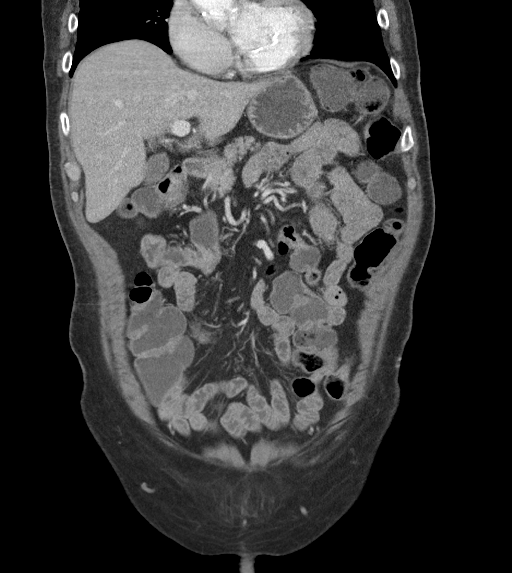
[im 51/115  soft-tissue]
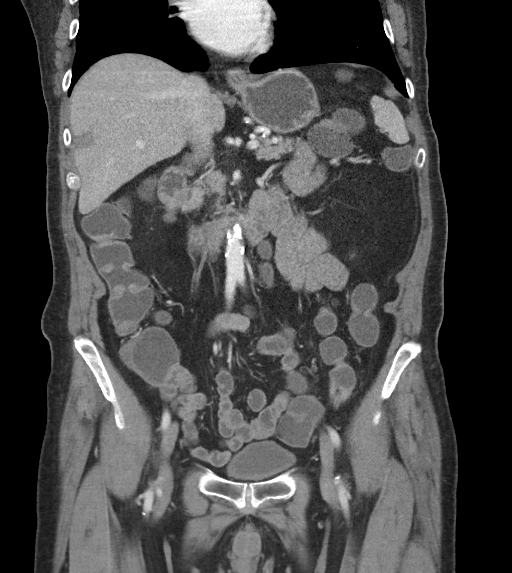
[im 64/115  soft-tissue]
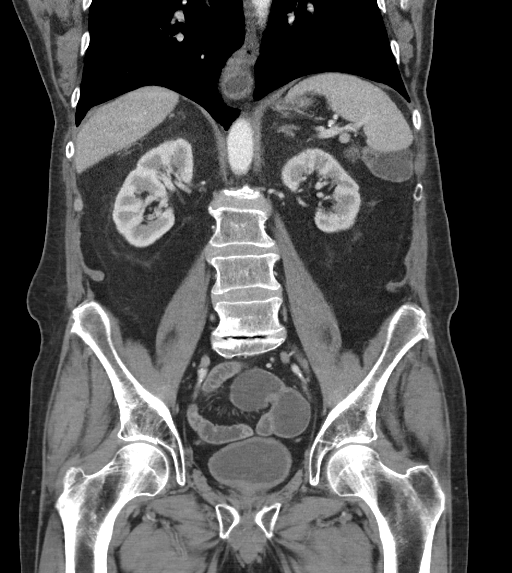

[17 of 46 positions shown; findings below may reference images not displayed]

FINDINGS: Lower chest: Small sliding hiatal hernia. Extensive coronary
atherosclerotic calcification with low-density thinning of the
inferior left ventricular wall. Prior median sternotomy.

Hepatobiliary: No focal liver abnormality.No evidence of biliary
obstruction or stone.

Pancreas: Unremarkable.

Spleen: Unremarkable.

Adrenals/Urinary Tract: Negative adrenals. No hydronephrosis or
stone. Generalized left renal cortical thinning. Unremarkable
bladder.

Stomach/Bowel: History of diarrhea with correlative fluid levels
throughout the colon, reaching the rectum. No bowel wall thickening.

Vascular/Lymphatic: Scattered atheromatous plaque. Atheromatous
plaque narrows the celiac and SMA origins to a mild degree. No mass
or adenopathy.

Reproductive:No pathologic findings.

Other: No ascites or pneumoperitoneum.

Musculoskeletal: No acute abnormalities. Spinal degeneration with
exaggerated lordosis and lower thoracic kyphosis.
IMPRESSION: 1. Diarrheal illness with fluid levels reaching the rectum.
2. Atherosclerosis, especially of the coronary arteries. Remote
inferior left ventricular infarct.
3. Small sliding hiatal hernia.

## 2022-03-15 SURGERY — ESOPHAGOGASTRODUODENOSCOPY (EGD) WITH PROPOFOL
Anesthesia: General

## 2022-03-15 MED ORDER — GLYCOPYRROLATE PF 0.2 MG/ML IJ SOSY
PREFILLED_SYRINGE | INTRAMUSCULAR | Status: DC | PRN
  Administered 2022-03-15: .2 mg via INTRAVENOUS

## 2022-03-15 MED ORDER — PROPOFOL 10 MG/ML IV BOLUS
INTRAVENOUS | Status: DC | PRN
  Administered 2022-03-15: 50 mg via INTRAVENOUS
  Administered 2022-03-15: 20 mg via INTRAVENOUS
  Administered 2022-03-15: 10 mg via INTRAVENOUS

## 2022-03-15 MED ORDER — PHENYLEPHRINE HCL (PRESSORS) 10 MG/ML IV SOLN
INTRAVENOUS | Status: DC | PRN
  Administered 2022-03-15: 160 ug via INTRAVENOUS
  Administered 2022-03-15: 80 ug via INTRAVENOUS
  Administered 2022-03-15: 40 ug via INTRAVENOUS

## 2022-03-15 MED ORDER — LACTATED RINGERS IV SOLN: INTRAVENOUS | Status: DC | PRN

## 2022-03-15 MED ORDER — BUTAMBEN-TETRACAINE-BENZOCAINE 2-2-14 % EX AERO
INHALATION_SPRAY | CUTANEOUS | Status: DC | PRN
  Administered 2022-03-15: 1 via TOPICAL

## 2022-03-15 NOTE — Transfer of Care (Signed)
Immediate Anesthesia Transfer of Care Note  Patient: Grant Stevens  Procedure(s) Performed: ESOPHAGOGASTRODUODENOSCOPY (EGD) WITH PROPOFOL ESOPHAGEAL DILATION  Patient Location: PACU  Anesthesia Type:MAC  Level of Consciousness: awake, alert , and oriented  Airway & Oxygen Therapy: Patient Spontanous Breathing  Post-op Assessment: Report given to RN and Post -op Vital signs reviewed and stable  Post vital signs: Reviewed and stable  Last Vitals:  Vitals Value Taken Time  BP    Temp    Pulse    Resp    SpO2      Last Pain:  Vitals:   03/15/22 1103  TempSrc: Oral  PainSc:       Patients Stated Pain Goal: 5 ([id number])  Complications: No notable events documented.

## 2022-03-15 NOTE — Op Note (Signed)
[facility] Patient Name: Grant Stevens Procedure Date: 03/15/2022 10:39 AM MRN: [id number] Date of Birth: 07-Oct-1940 Attending MD: [name] , , [id number] CSN: [id number] Age: 82 Admit Type: Outpatient Procedure:                Upper GI endoscopy Indications:              Dysphagia Providers:                [name], Crystal Page, Raphael Gibney,                            Technician Referring MD:              Medicines:                Monitored Anesthesia Care Complications:            No immediate complications. Estimated Blood Loss:     Estimated blood loss: none. Procedure:                Pre-Anesthesia Assessment:                           - Prior to the procedure, a History and Physical                            was performed, and patient medications, allergies                            and sensitivities were reviewed. The patient's                            tolerance of previous anesthesia was reviewed.                           - The risks and benefits of the procedure and the                            sedation options and risks were discussed with the                            patient. All questions were answered and informed                            consent was obtained.                           - ASA Grade Assessment: III - A patient with severe                            systemic disease.                           After obtaining informed consent, the endoscope was                            passed under direct vision. Throughout the  procedure, the patient's blood pressure, pulse, and                            oxygen saturations were monitored continuously. The                            [id number] scope was introduced through the                            mouth, and advanced to the second part of duodenum.                            The upper GI endoscopy was accomplished without                             difficulty. The patient tolerated the procedure                            well. Scope In: 10:52:11 AM Scope Out: 11:00:14 AM Total Procedure Duration: 0 hours 8 minutes 3 seconds  Findings:      The examined esophagus was moderately tortuous.      A hypertonic lower esophageal sphincter was found, there was some mild       puckering. A TTS dilator was passed through the scope. Dilation with a       15-16.5-18 mm balloon dilator was performed to 18 mm. The dilation site       was examined following endoscope reinsertion and showed mild mucosal       disruption.      A 3 cm hiatal hernia was present.      The gastroesophageal flap valve was visualized endoscopically and       classified as Hill Grade IV (no fold, wide open lumen, hiatal hernia       present).      The stomach was normal.      The examined duodenum was normal. Impression:               - Tortuous esophagus.                           - Hypertonic lower esophageal sphincter. Dilated.                           - 3 cm hiatal hernia.                           - Normal stomach.                           - Normal examined duodenum.                           - No specimens collected. Moderate Sedation:      Per Anesthesia Care Recommendation:           - Discharge patient to home (ambulatory).                           -  Resume previous diet.                           - Proceed with esophageal manometry. Procedure Code(s):        --- Professional ---                           [phone], Esophagogastroduodenoscopy, flexible,                            transoral; with transendoscopic balloon dilation of                            esophagus (less than 30 mm diameter) Diagnosis Code(s):        --- Professional ---                           Q39.9, Congenital malformation of esophagus,                            unspecified                           K22.89, Other specified disease of esophagus                           K44.9,  Diaphragmatic hernia without obstruction or                            gangrene                           R13.10, Dysphagia, unspecified CPT copyright 2022 American Medical Association. All rights reserved. The codes documented in this report are preliminary and upon coder review may  be revised to meet current compliance requirements. [name], MD [name],  03/15/2022 11:07:50 AM This report has been signed electronically. Number of Addenda: 0

## 2022-03-15 NOTE — Interval H&P Note (Signed)
History and Physical Interval Note:  03/15/2022 10:35 AM  Grant Stevens  has presented today for surgery, with the diagnosis of dysphagia, esophageal stricture.  The various methods of treatment have been discussed with the patient and family. After consideration of risks, benefits and other options for treatment, the patient has consented to  Procedure(s) with comments: ESOPHAGOGASTRODUODENOSCOPY (EGD) WITH PROPOFOL (N/A) - 1045am, asa 3 ESOPHAGEAL DILATION (N/A) as a surgical intervention.  The patient's history has been reviewed, patient examined, no change in status, stable for surgery.  I have reviewed the patient's chart and labs.  Questions were answered to the patient's satisfaction.     [name]

## 2022-03-15 NOTE — Anesthesia Preprocedure Evaluation (Signed)
Anesthesia Evaluation  Patient identified by MRN, date of birth, ID band Patient awake    Reviewed: Allergy & Precautions, H&P , NPO status , Patient's Chart, lab work & pertinent test results, reviewed documented beta blocker date and time   Airway Mallampati: II  TM Distance: >3 FB Neck ROM: full    Dental no notable dental hx.    Pulmonary neg pulmonary ROS, asthma , sleep apnea , former smoker   Pulmonary exam normal breath sounds clear to auscultation       Cardiovascular Exercise Tolerance: Good hypertension, + CAD, + Past MI and +CHF  negative cardio ROS + dysrhythmias  Rhythm:regular Rate:Normal     Neuro/Psych  PSYCHIATRIC DISORDERS  Depression    negative neurological ROS  negative psych ROS   GI/Hepatic negative GI ROS, Neg liver ROS,GERD  ,,  Endo/Other  negative endocrine ROS    Renal/GU Renal diseasenegative Renal ROS  negative genitourinary   Musculoskeletal   Abdominal   Peds  Hematology negative hematology ROS (+)   Anesthesia Other Findings   Reproductive/Obstetrics negative OB ROS                             Anesthesia Physical Anesthesia Plan  ASA: 3  Anesthesia Plan: General   Post-op Pain Management:    Induction:   PONV Risk Score and Plan: Propofol infusion  Airway Management Planned:   Additional Equipment:   Intra-op Plan:   Post-operative Plan:   Informed Consent: I have reviewed the patients History and Physical, chart, labs and discussed the procedure including the risks, benefits and alternatives for the proposed anesthesia with the patient or authorized representative who has indicated his/her understanding and acceptance.     Dental Advisory Given  Plan Discussed with: CRNA  Anesthesia Plan Comments:        Anesthesia Quick Evaluation

## 2022-03-15 NOTE — Discharge Instructions (Addendum)
You are being discharged to home.  Resume your previous diet.  Proceed with esophageal manometry.

## 2022-03-16 NOTE — Anesthesia Postprocedure Evaluation (Signed)
Anesthesia Post Note  Patient: Grant Stevens  Procedure(s) Performed: ESOPHAGOGASTRODUODENOSCOPY (EGD) WITH PROPOFOL ESOPHAGEAL DILATION  Patient location during evaluation: Phase II Anesthesia Type: General Level of consciousness: awake Pain management: pain level controlled Vital Signs Assessment: post-procedure vital signs reviewed and stable Respiratory status: spontaneous breathing and respiratory function stable Cardiovascular status: blood pressure returned to baseline and stable Postop Assessment: no headache and no apparent nausea or vomiting Anesthetic complications: no Comments: Late entry   No notable events documented.   Last Vitals:  Vitals:   03/15/22 1003 03/15/22 1104  BP:  (!) 138/57  Pulse: 60 67  Resp:  18  Temp: 36.5 C 36.6 C  SpO2: 96% 96%    Last Pain:  Vitals:   03/15/22 1104  TempSrc: Oral  PainSc: 0-No pain                 [name]

## 2022-03-17 ENCOUNTER — Encounter: Payer: Self-pay | Admitting: Internal Medicine

## 2022-03-17 NOTE — Progress Notes (Signed)
[facility]  Patient Information: Name:  Grant Stevens  DOB:  [birth year]  MRN:  [id number]   Planned Procedure:  ESOPHAGOGASTRODUODENOSCOPY (EGD) WITH PROPOFOL  ESOPHAGEAL DILATION  Surgeon:  Dr. Jenetta Downer Date of Procedure: 03/15/2022 Position during surgery:   left lateral  Device Information:  Clinic EP Physician:  Cristopher Peru, MD   Device Type:  Pacemaker Manufacturer and Phone #:  [employer]: [phone] Pacemaker Dependent?:  No. Date of Last Device Check:  02/10/2022 Normal Device Function?:  Yes.    Electrophysiologist's Recommendations:  Have magnet available. Provide continuous ECG monitoring when magnet is used or reprogramming is to be performed.  Procedure should not interfere with device function.  No device programming or magnet placement needed.  Per Device Clinic Standing Orders, Damian Leavell, RN  10:39 AM 03/17/2022

## 2022-03-21 ENCOUNTER — Ambulatory Visit (INDEPENDENT_AMBULATORY_CARE_PROVIDER_SITE_OTHER): Payer: PPO | Admitting: Gastroenterology

## 2022-03-21 DIAGNOSIS — S61511A Laceration without foreign body of right wrist, initial encounter: Secondary | ICD-10-CM | POA: Diagnosis not present

## 2022-03-21 DIAGNOSIS — B078 Other viral warts: Secondary | ICD-10-CM | POA: Diagnosis not present

## 2022-03-25 ENCOUNTER — Telehealth: Payer: Self-pay | Admitting: Cardiology

## 2022-03-25 ENCOUNTER — Other Ambulatory Visit: Payer: Self-pay | Admitting: Cardiology

## 2022-03-25 ENCOUNTER — Encounter (HOSPITAL_COMMUNITY): Payer: Self-pay | Admitting: Gastroenterology

## 2022-03-25 MED ORDER — BISOPROLOL FUMARATE 5 MG PO TABS: 2.5000 mg | ORAL_TABLET | Freq: Every day | ORAL | 1 refills | Status: DC

## 2022-03-25 NOTE — Telephone Encounter (Signed)
Completed.

## 2022-03-25 NOTE — Telephone Encounter (Signed)
*  STAT* If patient is at the pharmacy, call can be transferred to refill team.   1. Which medications need to be refilled? (please list name of each medication and dose if known) bisoprolol 5 mg   2. Which pharmacy/location (including street and city if local pharmacy) is medication to be sent to?[facility]  [address]   3. Do they need a 30 day or 90 day supply? [place]

## 2022-04-08 ENCOUNTER — Telehealth: Payer: Self-pay | Admitting: Cardiology

## 2022-04-08 DIAGNOSIS — Z0279 Encounter for issue of other medical certificate: Secondary | ICD-10-CM

## 2022-04-08 NOTE — Telephone Encounter (Signed)
Forms from [facility] brought in by Con Memos received on 04/08/22 . Completed patient authorization attached and billing sheet.  Will give to [facility] on 04/11/22

## 2022-04-10 DIAGNOSIS — I10 Essential (primary) hypertension: Secondary | ICD-10-CM | POA: Diagnosis not present

## 2022-04-10 DIAGNOSIS — E785 Hyperlipidemia, unspecified: Secondary | ICD-10-CM | POA: Diagnosis not present

## 2022-04-14 NOTE — Telephone Encounter (Signed)
Forms completed and patient called on 04/13/22 he had to be let a VM  [facility] 04/14/22

## 2022-04-22 ENCOUNTER — Other Ambulatory Visit: Payer: Self-pay | Admitting: Cardiology

## 2022-04-25 DIAGNOSIS — I5022 Chronic systolic (congestive) heart failure: Secondary | ICD-10-CM | POA: Diagnosis not present

## 2022-04-25 DIAGNOSIS — F319 Bipolar disorder, unspecified: Secondary | ICD-10-CM | POA: Diagnosis not present

## 2022-04-28 ENCOUNTER — Encounter: Payer: Medicare HMO | Admitting: Orthopedic Surgery

## 2022-05-06 ENCOUNTER — Encounter: Payer: Self-pay | Admitting: Orthopedic Surgery

## 2022-05-06 ENCOUNTER — Ambulatory Visit: Payer: Medicare HMO | Admitting: Orthopedic Surgery

## 2022-05-06 VITALS — BP 120/54 | HR 78 | Ht 72.0 in | Wt 221.0 lb

## 2022-05-06 DIAGNOSIS — M65351 Trigger finger, right little finger: Secondary | ICD-10-CM

## 2022-05-06 MED ORDER — METHYLPREDNISOLONE ACETATE 40 MG/ML IJ SUSP
40.0000 mg | Freq: Once | INTRAMUSCULAR | Status: AC
  Administered 2022-05-06: 40 mg via INTRA_ARTICULAR

## 2022-05-06 NOTE — Addendum Note (Signed)
Addended byCaffie Damme on: 05/06/2022 09:28 AM   Modules accepted: Orders

## 2022-05-06 NOTE — Progress Notes (Signed)
Type of appointment: New problem established patient  SUMMARY:   Encounter Diagnosis  Name Primary?   Trigger little finger of right hand Yes   Injection performed follow-up as needed  No orders of the defined types were placed in this encounter.    Chief Complaint  Patient presents with   Hand Problem    Right  5th finger trigger     82 year old male comes in with pain in his right small finger and track catching and locking and triggering for several weeks no history of trauma no numbness or tingling    ROS  Related musculoskeletal no trauma no laceration no redness no tingling no numbness no vascular changes  Body mass index is 29.97 kg/m.  Constitutional:  BP (!) 120/54   Pulse 78   Ht 6' (1.829 m)   Wt 221 lb (100.2 kg)   BMI 29.97 kg/m   General appearance: normal no deformities normal grooming and hygiene  CDV: Normal pulses and perfusion normal temperature without tenderness no swelling or varicosities, lower leg bilateral edema  Musculoskeletal Gait is fairly normal  @PHYSEXAM @   Inspection and palpation reveal right small finger tenderness over the A1 pulley  Range of motion inability to extend the finger is locked  Stability tests noncontributory  Motor exam normal flexor tendon function  Provocative tests none elicited  Skin normal without rash lesion ulcers or caf au lait spots  Neuro normal sensation normal coordination normal reflexes  Psychiatric awake alert and oriented x3 without depression anxiety or agitation   Past Medical History:  Diagnosis Date   Allergic rhinitis    Arthritis    Asthma    Childhood   Atrial fibrillation (HCC)    Remote history - WARCEF   Bladder cancer (HCC)    BPH (benign prostatic hyperplasia)    Cardiomyopathy (HCC)    CHF (congestive heart failure) (HCC)    a. EF 30-35% by echo in 2018 and 10/2018, at 25-30% in 05/2020   Coronary atherosclerosis of native coronary artery    Multivessel  s/p CABG in 2002 post-IMI, LVEF 35% up to 50% postoperatively;   Depression    Difficulty sleeping    Erectile dysfunction    Essential hypertension    Frequency of urination    History of bipolar disorder    Hyperlipidemia    IBS (irritable bowel syndrome)    Myocardial infarction (HCC) 2002   Past Surgical History:  Procedure Laterality Date   BIOPSY  03/28/2018   Procedure: BIOPSY;  Surgeon: Malissa Hippo, MD;  [facility];  Service: Endoscopy;;  esophagus   BIOPSY  03/09/2021   Procedure: BIOPSY;  Surgeon: Dolores Frame, MD;  [facility]: [facility];  Service: Gastroenterology;;   BIV PACEMAKER INSERTION CRT-P N/A 05/12/2020   Procedure: BIV PACEMAKER INSERTION CRT-P;  Surgeon: Hillis Range, MD;  Location: [facility];  Service: Cardiovascular;  Laterality: N/A;   CATARACT EXTRACTION W/PHACO Left 12/29/2014   Procedure: CATARACT EXTRACTION PHACO AND INTRAOCULAR LENS PLACEMENT (IOC);  Surgeon: Susa Simmonds, MD;  Location: [facility];  Service: Ophthalmology;  Laterality: Left;  CDE:3.71   CATARACT EXTRACTION W/PHACO Right 03/30/2015   Procedure: CATARACT EXTRACTION PHACO AND INTRAOCULAR LENS PLACEMENT RIGHT EYE CDE=2.56;  Surgeon: Susa Simmonds, MD;  Location: [facility];  Service: Ophthalmology;  Laterality: Right;   CIRCUMCISION  10/11/2003   COLONOSCOPY N/A 07/11/2012   rehman,transverse colon polyp (benign lymphoid); tortuous colon; prep adequate with much suction/lavage; hemorrhoids.  CORONARY ARTERY BYPASS GRAFT  08/10/2000   Dr. Tyrone Sage - LIMA to LAD, SVG to diagonal, SVG to PDA TRIPLE BYPASS   CYSTOSCOPY W/ RETROGRADES Bilateral 03/02/2015   Procedure: CYSTOSCOPY WITH RIGHT RETROGRADE PYELOGRAM,  ATTEMPTED LEFT RETROGRADE PYELOGRAM;  Surgeon: Malen Gauze, MD;  Location: [facility];  Service: Urology;  Laterality: Bilateral;   ESOPHAGEAL DILATION N/A 03/28/2018   Procedure: ESOPHAGEAL DILATION;  Surgeon: Malissa Hippo, MD;  [facility];  Service: Endoscopy;  Laterality: N/A;   ESOPHAGEAL DILATION N/A 03/15/2022   Procedure: ESOPHAGEAL DILATION;  Surgeon: Dolores Frame, MD;  [facility];  Service: Gastroenterology;  Laterality: N/A;   ESOPHAGOGASTRODUODENOSCOPY N/A 03/28/2018   rehman,Abnormal esophageal motility. mild schatzki ring at GEJ, dilated, patch of salmon colored mucosa at distal esophagus. Barrett's esophagus. 2cm HH. erosive gastropathy, normal pylorus, duodenal erosions w/o bleeding. normal second portion of duodenum   ESOPHAGOGASTRODUODENOSCOPY (EGD) WITH PROPOFOL N/A 03/09/2021   Procedure: ESOPHAGOGASTRODUODENOSCOPY (EGD) WITH PROPOFOL;  Surgeon: Dolores Frame, MD;  [facility];  Service: Gastroenterology;  Laterality: N/A;  945   ESOPHAGOGASTRODUODENOSCOPY (EGD) WITH PROPOFOL N/A 03/15/2022   Procedure: ESOPHAGOGASTRODUODENOSCOPY (EGD) WITH PROPOFOL;  Surgeon: Dolores Frame, MD;  [facility];  Service: Gastroenterology;  Laterality: N/A;  1045am, asa 3   TEMPORARY PACEMAKER N/A 05/12/2020   Procedure: TEMPORARY PACEMAKER;  Surgeon: Swaziland, Peter M, MD;  Location: [facility] INVASIVE CV LAB;  Service: Cardiovascular;  Laterality: N/A;   TONSILLECTOMY     TRANSURETHRAL RESECTION OF BLADDER TUMOR N/A 01/26/2015   Procedure: TRANSURETHRAL RESECTION OF BLADDER TUMOR (TURBT);  Surgeon: Malen Gauze, MD;  Location: [facility];  Service: Urology;  Laterality: N/A;   TRANSURETHRAL RESECTION OF BLADDER TUMOR WITH GYRUS (TURBT-GYRUS) N/A 03/02/2015   Procedure: TRANSURETHRAL RESECTION OF BLADDER TUMOR WITH GYRUS (TURBT-GYRUS);  Surgeon: Malen Gauze, MD;  Location: [facility];  Service: Urology;  Laterality: N/A;   TRANSURETHRAL RESECTION OF PROSTATE N/A 01/26/2015   Procedure: TRANSURETHRAL RESECTION OF THE PROSTATE WITH GYRUS INSTRUMENTS;  Surgeon: Malen Gauze, MD;  Location: [facility];  Service: Urology;  Laterality: N/A;   Social History    Tobacco Use   Smoking status: Former    Packs/day: 1.00    Years: 15.00    Additional pack years: 0.00    Total pack years: 15.00    Types: Cigarettes    Quit date: 01/11/1971    Years since quitting: 51.3    Passive exposure: Past   Smokeless tobacco: Never  Vaping Use   Vaping Use: Never used  Substance Use Topics   Alcohol use: Not Currently    Comment: occ.   Drug use: No     Assessment and Plan:  Imaging: I have personally reviewed the images and my personal interpretation of the images: No images today  Encounter Diagnosis  Name Primary?   Trigger little finger of right hand Yes    Trigger finger injection  Diagnosis right small finger  procedure injection A1 pulley Medications lidocaine 1% 1 mL and Depo-Medrol 40 mg 1 mL Skin prep alcohol and ethyl chloride Verbal consent was obtained Timeout confirmed the injection site  After cleaning the skin with alcohol and anesthetizing the skin with ethyl chloride the A1 pulley was palpated and the injection was performed without complication

## 2022-05-11 ENCOUNTER — Ambulatory Visit (INDEPENDENT_AMBULATORY_CARE_PROVIDER_SITE_OTHER): Payer: Medicare HMO

## 2022-05-11 DIAGNOSIS — I255 Ischemic cardiomyopathy: Secondary | ICD-10-CM | POA: Diagnosis not present

## 2022-05-11 LAB — CUP PACEART REMOTE DEVICE CHECK
Battery Remaining Longevity: 79 mo
Battery Remaining Percentage: 81 %
Battery Voltage: 3.01 V
Brady Statistic AP VP Percent: 40 %
Brady Statistic AP VS Percent: 1.6 %
Brady Statistic AS VP Percent: 56 %
Brady Statistic AS VS Percent: 1.7 %
Brady Statistic RA Percent Paced: 41 %
Brady Statistic RV Percent Paced: 97 %
Date Time Interrogation Session: 20240501061548
Implantable Lead Connection Status: 753985
Implantable Lead Connection Status: 753985
Implantable Lead Implant Date: 20220503
Implantable Lead Implant Date: 20220503
Implantable Lead Location: 753859
Implantable Lead Location: 753860
Implantable Pulse Generator Implant Date: 20220503
Lead Channel Impedance Value: 360 Ohm
Lead Channel Impedance Value: 410 Ohm
Lead Channel Pacing Threshold Amplitude: 0.75 V
Lead Channel Pacing Threshold Amplitude: 0.75 V
Lead Channel Pacing Threshold Pulse Width: 0.5 ms
Lead Channel Pacing Threshold Pulse Width: 0.5 ms
Lead Channel Sensing Intrinsic Amplitude: 1.4 mV
Lead Channel Sensing Intrinsic Amplitude: 12 mV
Lead Channel Setting Pacing Amplitude: 2 V
Lead Channel Setting Pacing Amplitude: 2.5 V
Lead Channel Setting Pacing Pulse Width: 0.5 ms
Lead Channel Setting Sensing Sensitivity: 2 mV
Pulse Gen Model: 2272
Pulse Gen Serial Number: 3920512

## 2022-05-30 ENCOUNTER — Encounter (INDEPENDENT_AMBULATORY_CARE_PROVIDER_SITE_OTHER): Payer: Self-pay | Admitting: Gastroenterology

## 2022-05-30 ENCOUNTER — Ambulatory Visit (INDEPENDENT_AMBULATORY_CARE_PROVIDER_SITE_OTHER): Payer: Medicare HMO | Admitting: Gastroenterology

## 2022-05-30 VITALS — BP 123/70 | HR 64 | Temp 98.3°F | Ht 72.0 in | Wt 212.9 lb

## 2022-05-30 DIAGNOSIS — R197 Diarrhea, unspecified: Secondary | ICD-10-CM | POA: Diagnosis not present

## 2022-05-30 DIAGNOSIS — R1310 Dysphagia, unspecified: Secondary | ICD-10-CM

## 2022-05-30 NOTE — Progress Notes (Signed)
Referring Provider: Carylon Perches, MD Primary Care Physician:  Carylon Perches, MD Primary GI Physician: Levon Hedger   Chief Complaint  Patient presents with   Constipation    Follow up on constipation. Still having some issues. Wearing depends. Does not always make it to restroom.    Dysphagia    Reports doing better since having EGD. Still having some trouble swallowing. Appt with [facility] is in July.    HPI:   Grant Stevens is a 82 y.o. male with past medical history of  localized bladder cancer s/p BCG injection, MI s/p CABG, asthma, BPH, cardiomyopathy, Heart failure, HTN, bipolar disorder, HLD, Barrett's esophagus, and IBS    Patient presenting today for follow up of dysphagia and diarrhea.  Last seen February 2024, at that time recent ER visit for constipation and some associated rectal bleeding with some blood noted in his underwear. CT with stercoral colitis. not currently taking anything as his stools are soft now and he is moving his bowels daily.  He is eating prunes.    Continued to have dysphagia, tries to eat very slow and avoid certain foods so that he does not choke. Denies GERD symptoms, he is taking protonix 40mg  daily and pepcid 40mg  at night. dysphagia symptoms occurring maybe  3-4x/week. At last visit he agreed to referral to [facility] for esophageal manometry but later declined to schedule the appt with them.   Recommended barium pill esophagram, referral to [facility] for manometry, continue PPI and H2B.  BPE as below, recommended to proceed with EGD   Present: Reports he is doing much better since EGD. Is still trying to eat slower but overall having no issues with swallowing as long as he does. Denies breakthrough GERD symptoms on protonix 40mg  daily and pepcid 40mg  QHS. He has appt for manometry in July at [facility].   Reports no real issues with constipation at this time. Has history of intermittent loose stools. He is having issues with this again.  He has some fecal  urgency/fecal soiling at times. Usually 1 BM per day, normal stools in between episodes of diarrhea. He restarted metamucil at recommendation of his PCP which he is taking 2 pills in the morning and 2 in the evening which he feels has helped some. He is eating some apples as he is trying to get a higher intake of fiber. No rectal bleeding or melena. Appetite is good.   BPE: 02/24/22 Severe esophageal dysmotility. Associated narrowing/stricture of the distal esophagus at the GE junction. The 13 mm barium tablet did not pass. Last Colonoscopy:Last Colonoscopy:2014 Examination performed to cecum. Quality of prep somewhat compromised examination of ascending colon and cecum. Redundant colon. Small polyp ablated via cold biopsy from proximal transverse colon - benign lymph node. Small external hemorrhoids Last Endoscopy:03/2022- Tortuous esophagus.                           - Hypertonic lower esophageal sphincter. Dilated.                           - 3 cm hiatal hernia.                           - Normal stomach.                           - Normal examined  duodenum.                           - No specimens collected.  Recommendations:    Past Medical History:  Diagnosis Date   Allergic rhinitis    Arthritis    Asthma    Childhood   Atrial fibrillation (HCC)    Remote history - WARCEF   Bladder cancer (HCC)    BPH (benign prostatic hyperplasia)    Cardiomyopathy (HCC)    CHF (congestive heart failure) (HCC)    a. EF 30-35% by echo in 2018 and 10/2018, at 25-30% in 05/2020   Coronary atherosclerosis of native coronary artery    Multivessel s/p CABG in 2002 post-IMI, LVEF 35% up to 50% postoperatively;   Depression    Difficulty sleeping    Erectile dysfunction    Essential hypertension    Frequency of urination    History of bipolar disorder    Hyperlipidemia    IBS (irritable bowel syndrome)    Myocardial infarction (HCC) 2002    Past Surgical History:  Procedure Laterality  Date   BIOPSY  03/28/2018   Procedure: BIOPSY;  Surgeon: Malissa Hippo, MD;  [facility];  Service: Endoscopy;;  esophagus   BIOPSY  03/09/2021   Procedure: BIOPSY;  Surgeon: Dolores Frame, MD;  [facility];  Service: Gastroenterology;;   BIV PACEMAKER INSERTION CRT-P N/A 05/12/2020   Procedure: BIV PACEMAKER INSERTION CRT-P;  Surgeon: Hillis Range, MD;  Location: [facility];  Service: Cardiovascular;  Laterality: N/A;   CATARACT EXTRACTION W/PHACO Left 12/29/2014   Procedure: CATARACT EXTRACTION PHACO AND INTRAOCULAR LENS PLACEMENT (IOC);  Surgeon: Susa Simmonds, MD;  Location: [facility];  Service: Ophthalmology;  Laterality: Left;  CDE:3.71   CATARACT EXTRACTION W/PHACO Right 03/30/2015   Procedure: CATARACT EXTRACTION PHACO AND INTRAOCULAR LENS PLACEMENT RIGHT EYE CDE=2.56;  Surgeon: Susa Simmonds, MD;  Location: [facility];  Service: Ophthalmology;  Laterality: Right;   CIRCUMCISION  10/11/2003   COLONOSCOPY N/A 07/11/2012   rehman,transverse colon polyp (benign lymphoid); tortuous colon; prep adequate with much suction/lavage; hemorrhoids.   CORONARY ARTERY BYPASS GRAFT  08/10/2000   Dr. Tyrone Sage - LIMA to LAD, SVG to diagonal, SVG to PDA TRIPLE BYPASS   CYSTOSCOPY W/ RETROGRADES Bilateral 03/02/2015   Procedure: CYSTOSCOPY WITH RIGHT RETROGRADE PYELOGRAM,  ATTEMPTED LEFT RETROGRADE PYELOGRAM;  Surgeon: Malen Gauze, MD;  Location: [facility];  Service: Urology;  Laterality: Bilateral;   ESOPHAGEAL DILATION N/A 03/28/2018   Procedure: ESOPHAGEAL DILATION;  Surgeon: Malissa Hippo, MD;  [facility];  Service: Endoscopy;  Laterality: N/A;   ESOPHAGEAL DILATION N/A 03/15/2022   Procedure: ESOPHAGEAL DILATION;  Surgeon: Dolores Frame, MD;  [facility];  Service: Gastroenterology;  Laterality: N/A;   ESOPHAGOGASTRODUODENOSCOPY N/A 03/28/2018   rehman,Abnormal esophageal motility. mild schatzki ring at GEJ,  dilated, patch of salmon colored mucosa at distal esophagus. Barrett's esophagus. 2cm HH. erosive gastropathy, normal pylorus, duodenal erosions w/o bleeding. normal second portion of duodenum   ESOPHAGOGASTRODUODENOSCOPY (EGD) WITH PROPOFOL N/A 03/09/2021   Procedure: ESOPHAGOGASTRODUODENOSCOPY (EGD) WITH PROPOFOL;  Surgeon: Dolores Frame, MD;  [facility];  Service: Gastroenterology;  Laterality: N/A;  945   ESOPHAGOGASTRODUODENOSCOPY (EGD) WITH PROPOFOL N/A 03/15/2022   Procedure: ESOPHAGOGASTRODUODENOSCOPY (EGD) WITH PROPOFOL;  Surgeon: Dolores Frame, MD;  [facility];  Service: Gastroenterology;  Laterality: N/A;  1045am, asa 3  TEMPORARY PACEMAKER N/A 05/12/2020   Procedure: TEMPORARY PACEMAKER;  Surgeon: [place], Peter M, MD;  Location[facility] CV LAB;  Service: Cardiovascular;  Laterality: N/A;   TONSILLECTOMY     TRANSURETHRAL RESECTION OF BLADDER TUMOR N/A 01/26/2015   Procedure: TRANSURETHRAL RESECTION OF BLADDER TUMOR (TURBT);  Surgeon: Malen Gauze, MD;  Location: [facility];  Service: Urology;  Laterality: N/A;   TRANSURETHRAL RESECTION OF BLADDER TUMOR WITH GYRUS (TURBT-GYRUS) N/A 03/02/2015   Procedure: TRANSURETHRAL RESECTION OF BLADDER TUMOR WITH GYRUS (TURBT-GYRUS);  Surgeon: Malen Gauze, MD;  Location: [facility];  Service: Urology;  Laterality: N/A;   TRANSURETHRAL RESECTION OF PROSTATE N/A 01/26/2015   Procedure: TRANSURETHRAL RESECTION OF THE PROSTATE WITH GYRUS INSTRUMENTS;  Surgeon: Malen Gauze, MD;  Location: [facility];  Service: Urology;  Laterality: N/A;    Current Outpatient Medications  Medication Sig Dispense Refill   aspirin EC 81 MG tablet Take 1 tablet (81 mg total) by mouth daily.     atorvastatin (LIPITOR) 80 MG tablet Take 1 tablet (80 mg total) by mouth at bedtime. 90 tablet 3   B Complex-C (SUPER B COMPLEX PO) Take 1 capsule by mouth daily.     bisoprolol (ZEBETA) 5 MG tablet TAKE 1/2 TABLET(2.5 MG)  BY MOUTH DAILY 45 tablet 1   carbamazepine (TEGRETOL) 200 MG tablet Take 400 mg by mouth 2 (two) times daily.      divalproex (DEPAKOTE) 250 MG DR tablet Take 250 mg by mouth 2 (two) times daily.     ENTRESTO 24-26 MG TAKE 1 TABLET BY MOUTH TWICE DAILY 60 tablet 3   EPINEPHrine 0.3 mg/0.3 mL IJ SOAJ injection Inject 0.3 mg into the muscle as needed for anaphylaxis. 1 each 0   famotidine (PEPCID) 40 MG tablet TAKE (1) TABLET BY MOUTH AT BEDTIME. 90 tablet 1   furosemide (LASIX) 40 MG tablet Take 0.5-1 tablets (20-40 mg total) by mouth daily. 20mg  one day then 40mg  the other day alternating 20mg -40mg  90 tablet 3   LORazepam (ATIVAN) 0.5 MG tablet Take 0.5 mg by mouth at bedtime.     pantoprazole (PROTONIX) 40 MG tablet Take 1 tablet (40 mg total) by mouth daily before breakfast. 90 tablet 3   silodosin (RAPAFLO) 8 MG CAPS capsule Take 1 capsule (8 mg total) by mouth daily with breakfast. 30 capsule 11   temazepam (RESTORIL) 15 MG capsule Take 15 mg by mouth at bedtime as needed for sleep.     No current facility-administered medications for this visit.    Allergies as of 05/30/2022 - Review Complete 05/30/2022  Allergen Reaction Noted   Spironolactone Other (See Comments) 10/07/2020    Family History  Problem Relation Age of Onset   Asthma Mother    Heart attack Father 35   Diabetes Brother     Social History   Socioeconomic History   Marital status: Widowed    Spouse name: Not on file   Number of children: Not on file   Years of education: Not on file   Highest education level: Not on file  Occupational History   Not on file  Tobacco Use   Smoking status: Former    Packs/day: 1.00    Years: 15.00    Additional pack years: 0.00    Total pack years: 15.00    Types: Cigarettes    Quit date: 01/11/1971    Years since quitting: 51.4    Passive exposure: Past   Smokeless tobacco: Never  Vaping Use  Vaping Use: Never used  Substance and Sexual Activity   Alcohol use: Not  Currently    Comment: occ.   Drug use: No   Sexual activity: Not Currently  Other Topics Concern   Not on file  Social History Narrative   Married with no children    Exercises 4 times weekly   Caffeine once a day   Social Determinants of Health   Financial Resource Strain: Not on file  Food Insecurity: Not on file  Transportation Needs: Not on file  Physical Activity: Not on file  Stress: Not on file  Social Connections: Not on file   Review of systems General: negative for malaise, night sweats, fever, chills, weight loss Neck: Negative for lumps, goiter, pain and significant neck swelling Resp: Negative for cough, wheezing, dyspnea at rest CV: Negative for chest pain, leg swelling, palpitations, orthopnea GI: denies melena, hematochezia, nausea, vomiting, constipation, dysphagia, odyonophagia, early satiety or unintentional weight loss. +diarrhea  MSK: Negative for joint pain or swelling, back pain, and muscle pain. Derm: Negative for itching or rash Psych: Denies depression, anxiety, memory loss, confusion. No homicidal or suicidal ideation.  Heme: Negative for prolonged bleeding, bruising easily, and swollen nodes. Endocrine: Negative for cold or heat intolerance, polyuria, polydipsia and goiter. Neuro: negative for tremor, gait imbalance, syncope and seizures. The remainder of the review of systems is noncontributory.  Physical Exam: BP 123/70 (BP Location: Left Arm, Patient Position: Sitting, Cuff Size: Normal)   Pulse 64   Temp 98.3 F (36.8 C) (Oral)   Ht 6' (1.829 m)   Wt 212 lb 14.4 oz (96.6 kg)   BMI 28.87 kg/m  General:   Alert and oriented. No distress noted. Pleasant and cooperative.  Head:  Normocephalic and atraumatic. Eyes:  Conjuctiva clear without scleral icterus. Mouth:  Oral mucosa pink and moist. Good dentition. No lesions. Heart: Normal rate and rhythm, s1 and s2 heart sounds present.  Lungs: Clear lung sounds in all lobes. Respirations equal  and unlabored. Abdomen:  +BS, soft, non-tender and non-distended. No rebound or guarding. No HSM or masses noted. Derm: No palmar erythema or jaundice Msk:  Symmetrical without gross deformities. Normal posture. Extremities:  Without edema. Neurologic:  Alert and  oriented x4 Psych:  Alert and cooperative. Normal mood and affect.  Invalid input(s): "6 MONTHS"   ASSESSMENT: LINDBERGH FELIU is a 82 y.o. male presenting today for follow up of dysphagia and constipation/diarrhea.  Dysphagia: much improved after EGD with dilation, as above. BPE concerning for dysmotility. He has appt with [facility] for esophageal manometry. Recommend to continue with chewing precautions, eating slow, taking small bites, chewing thoroughly and taking sips of liquids between bites   Constipation/diarrhea: history of alternating constipation and diarrhea. Now with some intermittent diarrhea/fecal soiling. Somewhat improved with use of metamucil. I counseled him on certain fruits such as apples and high fructose fruits which can actually make his diarrhea worse. Recommend starting benefiber 1T BID with a meal once he is finished with his current metamucil. Should make sure water intake is good to avoid constipation. He can increase to 3T of benefiber per day if needed   PLAN:  Continue with protonix 40mg  daily, Pepcid 40mg  QHS  2. Benefiber 1T BID with meals, can increase to 1T 3. Proceed with esophageal manometry at [facility] in July  4. Continue with chewing precautions 5. Avoid apples and other fruits with fructose  All questions were answered, patient verbalized understanding and is in agreement with plan as  outlined above.    Follow Up: 6 months   Coltin Casher L. Jeanmarie Hubert, MSN, APRN, AGNP-C Adult-Gerontology Nurse Practitioner [facility]  I have reviewed the note and agree with the APP's assessment as described in this progress note  Katrinka Blazing, MD Gastroenterology and  Hepatology [facility]

## 2022-05-30 NOTE — Patient Instructions (Signed)
Continue with protonix 40mg  daily, Pepcid 40mg  each evening   Please keep appointment with [facility] for your esophageal manometry in July, they will send the report back to [place] once this is done and we will be in touch to discuss Continue to take small bites, eat slow, chew thoroughly and take sips of liquids between bites You can continue the metamucil for now, once you are out of this, you may try benefiber, you can start with 1T twice a daily with a meal and increase to up to 3T per day. Make sure water intake is good to avoid constipation Continue with high fiber diet, be mindful of certain fruits like apples as these can actually worsen diarrhea. Prunes and kiwi are high in fiber  Follow up 6 months  It was a pleasure to see you today. I want to create trusting relationships with patients and provide genuine, compassionate, and quality care. I truly value your feedback! please be on the lookout for a survey regarding your visit with me today. I appreciate your input about our visit and your time in completing this!    Grant Stevens L. Jeanmarie Hubert, MSN, APRN, AGNP-C Adult-Gerontology Nurse Practitioner [facility] at [facility]

## 2022-05-31 DIAGNOSIS — F3110 Bipolar disorder, current episode manic without psychotic features, unspecified: Secondary | ICD-10-CM | POA: Diagnosis not present

## 2022-06-01 NOTE — Progress Notes (Signed)
Remote pacemaker transmission.   

## 2022-07-06 DIAGNOSIS — H903 Sensorineural hearing loss, bilateral: Secondary | ICD-10-CM | POA: Diagnosis not present

## 2022-07-06 DIAGNOSIS — H6123 Impacted cerumen, bilateral: Secondary | ICD-10-CM | POA: Diagnosis not present

## 2022-07-20 ENCOUNTER — Telehealth (INDEPENDENT_AMBULATORY_CARE_PROVIDER_SITE_OTHER): Payer: Self-pay | Admitting: *Deleted

## 2022-07-20 DIAGNOSIS — R1310 Dysphagia, unspecified: Secondary | ICD-10-CM | POA: Diagnosis not present

## 2022-07-20 DIAGNOSIS — Z539 Procedure and treatment not carried out, unspecified reason: Secondary | ICD-10-CM | POA: Diagnosis not present

## 2022-07-20 NOTE — Telephone Encounter (Signed)
[facility] left message - Grant Stevens was schedule for esophageal manometry today; however they were not able to place the probe past the stricture - if you wish for them to place it by EGD they will need new referrals for both -

## 2022-07-21 DIAGNOSIS — F319 Bipolar disorder, unspecified: Secondary | ICD-10-CM | POA: Diagnosis not present

## 2022-07-21 DIAGNOSIS — D696 Thrombocytopenia, unspecified: Secondary | ICD-10-CM | POA: Diagnosis not present

## 2022-07-21 DIAGNOSIS — Z79899 Other long term (current) drug therapy: Secondary | ICD-10-CM | POA: Diagnosis not present

## 2022-07-21 DIAGNOSIS — I5022 Chronic systolic (congestive) heart failure: Secondary | ICD-10-CM | POA: Diagnosis not present

## 2022-07-21 DIAGNOSIS — C679 Malignant neoplasm of bladder, unspecified: Secondary | ICD-10-CM | POA: Diagnosis not present

## 2022-07-21 NOTE — Telephone Encounter (Signed)
Referrals have been faxed.

## 2022-07-26 DIAGNOSIS — D696 Thrombocytopenia, unspecified: Secondary | ICD-10-CM | POA: Diagnosis not present

## 2022-07-26 DIAGNOSIS — I2510 Atherosclerotic heart disease of native coronary artery without angina pectoris: Secondary | ICD-10-CM | POA: Diagnosis not present

## 2022-07-26 DIAGNOSIS — E785 Hyperlipidemia, unspecified: Secondary | ICD-10-CM | POA: Diagnosis not present

## 2022-07-26 DIAGNOSIS — I5022 Chronic systolic (congestive) heart failure: Secondary | ICD-10-CM | POA: Diagnosis not present

## 2022-07-27 ENCOUNTER — Encounter: Payer: Self-pay | Admitting: Cardiology

## 2022-07-27 ENCOUNTER — Ambulatory Visit: Payer: Medicare HMO | Attending: Cardiology | Admitting: Cardiology

## 2022-07-27 VITALS — BP 118/60 | HR 60 | Ht 72.0 in | Wt 217.0 lb

## 2022-07-27 DIAGNOSIS — I442 Atrioventricular block, complete: Secondary | ICD-10-CM

## 2022-07-27 DIAGNOSIS — I5020 Unspecified systolic (congestive) heart failure: Secondary | ICD-10-CM

## 2022-07-27 DIAGNOSIS — I25119 Atherosclerotic heart disease of native coronary artery with unspecified angina pectoris: Secondary | ICD-10-CM | POA: Diagnosis not present

## 2022-07-27 MED ORDER — EMPAGLIFLOZIN 10 MG PO TABS: 10.0000 mg | ORAL_TABLET | Freq: Every day | ORAL | 11 refills | Status: DC

## 2022-07-27 NOTE — Progress Notes (Signed)
Cardiology Office Note  Date: 07/27/2022   ID: Grant Stevens, DOB 12/07/1940, MRN [id number]  History of Present Illness: Grant Stevens is an 82 y.o. male is seen in January.  He is here for a routine visit.  He reports NYHA class II dyspnea, exercises at the gym 3 days a week.  Also enjoys yard work as tolerated.  He does not describe any angina and has had no sudden dizziness or syncope.  We went over his medications.  He did stop Jardiance due to cost previously but now has new insurance with better coverage and we will plan to resume it.  No other change in his cardiac medications.  He had recent lab work with Dr. Ouida Sills as noted below.  St. Jude pacemaker in place with follow-up by Dr. Ladona Ridgel.  Device interrogation in May revealed normal function.  Physical Exam: VS:  BP 118/60 (BP Location: Right Arm, Patient Position: Sitting, Cuff Size: Normal)   Pulse 60   Ht 6' (1.829 m)   Wt 217 lb (98.4 kg)   SpO2 95%   BMI 29.43 kg/m , BMI Body mass index is 29.43 kg/m.  Wt Readings from Last 3 Encounters:  07/27/22 217 lb (98.4 kg)  05/30/22 212 lb 14.4 oz (96.6 kg)  05/06/22 221 lb (100.2 kg)    General: Patient appears comfortable at rest. HEENT: Conjunctiva and lids normal. Neck: Supple, no elevated JVP or carotid bruits. Lungs: Clear to auscultation, nonlabored breathing at rest. Cardiac: Regular rate and rhythm, no S3, 1/6 systolic murmur. Extremities: Mild lower leg edema.  ECG:  An ECG dated 02/18/2022 was personally reviewed today and demonstrated:  Ventricular pacing.  Labwork: 02/18/2022: ALT 25; AST 24 02/28/2022: BUN 18; Creatinine, Ser 1.11; Hemoglobin 11.3; Platelets 113; Potassium 4.5; Sodium 136     Component Value Date/Time   CHOL 145 05/12/2020 1101   TRIG 79 05/12/2020 1101   HDL 48 05/12/2020 1101   CHOLHDL 3.0 05/12/2020 1101   VLDL 16 05/12/2020 1101   LDLCALC 81 05/12/2020 1101  July 2024: Hemoglobin 12, platelets 107, BUN 23, creatinine 1.22,  potassium 4.5, AST 38, ALT 57, cholesterol 141, triglycerides 52, HDL 52, LDL 78  Other Studies Reviewed Today:  Echocardiogram 03/03/2022:  1. Left ventricular ejection fraction, by estimation, is 25 to 30%. The  left ventricle has severely decreased function. Left ventricular  endocardial border not optimally defined to evaluate regional wall motion.  The left ventricular internal cavity size   was moderately dilated. Left ventricular diastolic parameters are  consistent with Grade I diastolic dysfunction (impaired relaxation).   2. Right ventricular systolic function is mildly reduced. The right  ventricular size is normal. Tricuspid regurgitation signal is inadequate  for assessing PA pressure.   3. Left atrial size was moderately dilated.   4. Right atrial size was mildly dilated.   5. The mitral valve is abnormal. Trivial mitral valve regurgitation. No  evidence of mitral stenosis. Severe mitral annular calcification.   6. The aortic valve is tricuspid. There is mild calcification of the  aortic valve. Aortic valve regurgitation is trivial. No aortic stenosis is  present.   7. The inferior vena cava is dilated in size with >50% respiratory  variability, suggesting right atrial pressure of 8 mmHg.   Assessment and Plan:  1.  Multivessel CAD status post CABG in 2002 following inferior STEMI with LIMA to LAD, SVG to diagonal, and SVG to PDA.  He reports no active angina and we  have continued with medical therapy including aspirin and Lipitor.  2.  HFrEF with ischemic cardiomyopathy, LVEF 25 to 30% by echocardiogram in February.  We have managed him medically with GDMT.  Currently with NYHA class II symptoms on bisoprolol, Entresto, and Lasix.  Adding back Jardiance 10 mg daily.  He is not on Aldactone with history of hyperkalemia.  3.  Conduction system disease status post St. Jude pacemaker with follow-up by Dr. Ladona Ridgel.  Device interrogation in May revealed normal  function.  Disposition:  Follow up  6 months.  Signed, Jonelle Sidle, M.D., F.A.C.C. [facility] at [facility]

## 2022-07-27 NOTE — Patient Instructions (Signed)
Medication Instructions:   Re-start Jardiance 10 mg daily   Labwork:  None today  Testing/Procedures: None today  Follow-Up: 6 months  Any Other Special Instructions Will Be Listed Below (If Applicable).  If you need a refill on your cardiac medications before your next appointment, please call your pharmacy.

## 2022-08-01 ENCOUNTER — Ambulatory Visit: Payer: Medicare HMO | Admitting: Urology

## 2022-08-01 ENCOUNTER — Encounter (INDEPENDENT_AMBULATORY_CARE_PROVIDER_SITE_OTHER): Payer: Self-pay | Admitting: *Deleted

## 2022-08-01 VITALS — BP 103/66 | HR 70 | Ht 72.0 in | Wt 217.0 lb

## 2022-08-01 DIAGNOSIS — Z8551 Personal history of malignant neoplasm of bladder: Secondary | ICD-10-CM | POA: Diagnosis not present

## 2022-08-01 LAB — URINALYSIS, ROUTINE W REFLEX MICROSCOPIC
Bilirubin, UA: NEGATIVE
Ketones, UA: NEGATIVE
Leukocytes,UA: NEGATIVE
Nitrite, UA: NEGATIVE
RBC, UA: NEGATIVE
Specific Gravity, UA: 1.015 (ref 1.005–1.030)
Urobilinogen, Ur: 1 mg/dL (ref 0.2–1.0)
pH, UA: 7 (ref 5.0–7.5)

## 2022-08-01 MED ORDER — CIPROFLOXACIN HCL 500 MG PO TABS
500.0000 mg | ORAL_TABLET | Freq: Once | ORAL | Status: AC
  Administered 2022-08-01: 500 mg via ORAL

## 2022-08-01 NOTE — Progress Notes (Signed)
   08/01/22  CC: followup bladder cancer   HPI: [name] is a 82yo here for followup for bladder cancer Blood pressure 103/66, pulse 70, height 6' (1.829 m), weight 217 lb (98.4 kg). NED. A&Ox3.   No respiratory distress   Abd soft, NT, ND Normal phallus with bilateral descended testicles  Cystoscopy Procedure Note  Patient identification was confirmed, informed consent was obtained, and patient was prepped using Betadine solution.  Lidocaine jelly was administered per urethral meatus.     Pre-Procedure: - Inspection reveals a normal caliber ureteral meatus.  Procedure: The flexible cystoscope was introduced without difficulty - No urethral strictures/lesions are present. -  well resected prostatic fossa  prostate  - Normal bladder neck - Bilateral ureteral orifices identified - Bladder mucosa  reveals no ulcers, tumors, or lesions - No bladder stones - No trabeculation     Post-Procedure: - Patient tolerated the procedure well  Assessment/ Plan: Followup 1 year for cystoscopy  No follow-ups on file.  Wilkie Aye, MD

## 2022-08-09 ENCOUNTER — Encounter: Payer: Self-pay | Admitting: Urology

## 2022-08-09 NOTE — Patient Instructions (Signed)

## 2022-08-10 ENCOUNTER — Ambulatory Visit (INDEPENDENT_AMBULATORY_CARE_PROVIDER_SITE_OTHER): Payer: Medicare HMO

## 2022-08-10 DIAGNOSIS — I255 Ischemic cardiomyopathy: Secondary | ICD-10-CM | POA: Diagnosis not present

## 2022-08-10 LAB — CUP PACEART REMOTE DEVICE CHECK
Battery Remaining Longevity: 77 mo
Battery Remaining Percentage: 78 %
Battery Voltage: 2.99 V
Brady Statistic AP VP Percent: 43 %
Brady Statistic AP VS Percent: 1.4 %
Brady Statistic AS VP Percent: 54 %
Brady Statistic AS VS Percent: 1.1 %
Brady Statistic RA Percent Paced: 44 %
Brady Statistic RV Percent Paced: 97 %
Date Time Interrogation Session: 20240731040013
Implantable Lead Connection Status: 753985
Implantable Lead Connection Status: 753985
Implantable Lead Implant Date: 20220503
Implantable Lead Implant Date: 20220503
Implantable Lead Location: 753859
Implantable Lead Location: 753860
Implantable Pulse Generator Implant Date: 20220503
Lead Channel Impedance Value: 360 Ohm
Lead Channel Impedance Value: 410 Ohm
Lead Channel Pacing Threshold Amplitude: 0.75 V
Lead Channel Pacing Threshold Amplitude: 0.75 V
Lead Channel Pacing Threshold Pulse Width: 0.5 ms
Lead Channel Pacing Threshold Pulse Width: 0.5 ms
Lead Channel Sensing Intrinsic Amplitude: 1.2 mV
Lead Channel Sensing Intrinsic Amplitude: 12 mV
Lead Channel Setting Pacing Amplitude: 2 V
Lead Channel Setting Pacing Amplitude: 2.5 V
Lead Channel Setting Pacing Pulse Width: 0.5 ms
Lead Channel Setting Sensing Sensitivity: 2 mV
Pulse Gen Model: 2272
Pulse Gen Serial Number: 3920512

## 2022-08-20 ENCOUNTER — Inpatient Hospital Stay (HOSPITAL_COMMUNITY): Payer: Medicare HMO

## 2022-08-20 ENCOUNTER — Emergency Department (HOSPITAL_COMMUNITY): Payer: Medicare HMO

## 2022-08-20 ENCOUNTER — Encounter (HOSPITAL_COMMUNITY): Payer: Self-pay | Admitting: Emergency Medicine

## 2022-08-20 ENCOUNTER — Inpatient Hospital Stay (HOSPITAL_COMMUNITY)
Admission: EM | Admit: 2022-08-20 | Discharge: 2022-09-02 | DRG: 280 | Disposition: A | Discharge status: Hospice | Payer: Medicare HMO | Attending: Internal Medicine | Admitting: Internal Medicine

## 2022-08-20 ENCOUNTER — Other Ambulatory Visit: Payer: Self-pay

## 2022-08-20 DIAGNOSIS — M1990 Unspecified osteoarthritis, unspecified site: Secondary | ICD-10-CM | POA: Diagnosis present

## 2022-08-20 DIAGNOSIS — Z7189 Other specified counseling: Secondary | ICD-10-CM | POA: Diagnosis not present

## 2022-08-20 DIAGNOSIS — N179 Acute kidney failure, unspecified: Secondary | ICD-10-CM

## 2022-08-20 DIAGNOSIS — K219 Gastro-esophageal reflux disease without esophagitis: Secondary | ICD-10-CM | POA: Diagnosis not present

## 2022-08-20 DIAGNOSIS — J45909 Unspecified asthma, uncomplicated: Secondary | ICD-10-CM | POA: Diagnosis present

## 2022-08-20 DIAGNOSIS — I700 Atherosclerosis of aorta: Secondary | ICD-10-CM | POA: Diagnosis not present

## 2022-08-20 DIAGNOSIS — Z950 Presence of cardiac pacemaker: Secondary | ICD-10-CM

## 2022-08-20 DIAGNOSIS — R0789 Other chest pain: Secondary | ICD-10-CM | POA: Diagnosis not present

## 2022-08-20 DIAGNOSIS — R0902 Hypoxemia: Secondary | ICD-10-CM | POA: Diagnosis present

## 2022-08-20 DIAGNOSIS — G8929 Other chronic pain: Secondary | ICD-10-CM | POA: Diagnosis present

## 2022-08-20 DIAGNOSIS — I213 ST elevation (STEMI) myocardial infarction of unspecified site: Secondary | ICD-10-CM | POA: Diagnosis not present

## 2022-08-20 DIAGNOSIS — Z6829 Body mass index (BMI) 29.0-29.9, adult: Secondary | ICD-10-CM

## 2022-08-20 DIAGNOSIS — I5023 Acute on chronic systolic (congestive) heart failure: Secondary | ICD-10-CM | POA: Diagnosis not present

## 2022-08-20 DIAGNOSIS — I25810 Atherosclerosis of coronary artery bypass graft(s) without angina pectoris: Secondary | ICD-10-CM | POA: Diagnosis present

## 2022-08-20 DIAGNOSIS — N400 Enlarged prostate without lower urinary tract symptoms: Secondary | ICD-10-CM | POA: Diagnosis present

## 2022-08-20 DIAGNOSIS — Z7401 Bed confinement status: Secondary | ICD-10-CM | POA: Diagnosis not present

## 2022-08-20 DIAGNOSIS — I255 Ischemic cardiomyopathy: Secondary | ICD-10-CM | POA: Diagnosis present

## 2022-08-20 DIAGNOSIS — Z515 Encounter for palliative care: Secondary | ICD-10-CM

## 2022-08-20 DIAGNOSIS — Z66 Do not resuscitate: Secondary | ICD-10-CM | POA: Diagnosis not present

## 2022-08-20 DIAGNOSIS — Z87891 Personal history of nicotine dependence: Secondary | ICD-10-CM

## 2022-08-20 DIAGNOSIS — F319 Bipolar disorder, unspecified: Secondary | ICD-10-CM | POA: Diagnosis present

## 2022-08-20 DIAGNOSIS — N184 Chronic kidney disease, stage 4 (severe): Secondary | ICD-10-CM | POA: Diagnosis present

## 2022-08-20 DIAGNOSIS — R918 Other nonspecific abnormal finding of lung field: Secondary | ICD-10-CM | POA: Diagnosis not present

## 2022-08-20 DIAGNOSIS — I2510 Atherosclerotic heart disease of native coronary artery without angina pectoris: Secondary | ICD-10-CM | POA: Diagnosis present

## 2022-08-20 DIAGNOSIS — I5030 Unspecified diastolic (congestive) heart failure: Secondary | ICD-10-CM | POA: Diagnosis not present

## 2022-08-20 DIAGNOSIS — Z8679 Personal history of other diseases of the circulatory system: Secondary | ICD-10-CM

## 2022-08-20 DIAGNOSIS — I4891 Unspecified atrial fibrillation: Secondary | ICD-10-CM | POA: Diagnosis present

## 2022-08-20 DIAGNOSIS — D696 Thrombocytopenia, unspecified: Secondary | ICD-10-CM | POA: Diagnosis present

## 2022-08-20 DIAGNOSIS — I959 Hypotension, unspecified: Secondary | ICD-10-CM | POA: Insufficient documentation

## 2022-08-20 DIAGNOSIS — Z1152 Encounter for screening for COVID-19: Secondary | ICD-10-CM | POA: Diagnosis not present

## 2022-08-20 DIAGNOSIS — Z789 Other specified health status: Secondary | ICD-10-CM | POA: Diagnosis not present

## 2022-08-20 DIAGNOSIS — I495 Sick sinus syndrome: Secondary | ICD-10-CM | POA: Diagnosis present

## 2022-08-20 DIAGNOSIS — Z79899 Other long term (current) drug therapy: Secondary | ICD-10-CM

## 2022-08-20 DIAGNOSIS — Z888 Allergy status to other drugs, medicaments and biological substances status: Secondary | ICD-10-CM

## 2022-08-20 DIAGNOSIS — I442 Atrioventricular block, complete: Secondary | ICD-10-CM | POA: Diagnosis present

## 2022-08-20 DIAGNOSIS — M25561 Pain in right knee: Secondary | ICD-10-CM | POA: Diagnosis not present

## 2022-08-20 DIAGNOSIS — Z452 Encounter for adjustment and management of vascular access device: Secondary | ICD-10-CM | POA: Diagnosis not present

## 2022-08-20 DIAGNOSIS — I214 Non-ST elevation (NSTEMI) myocardial infarction: Principal | ICD-10-CM | POA: Diagnosis present

## 2022-08-20 DIAGNOSIS — Z7982 Long term (current) use of aspirin: Secondary | ICD-10-CM

## 2022-08-20 DIAGNOSIS — Z7902 Long term (current) use of antithrombotics/antiplatelets: Secondary | ICD-10-CM

## 2022-08-20 DIAGNOSIS — I5084 End stage heart failure: Secondary | ICD-10-CM | POA: Diagnosis present

## 2022-08-20 DIAGNOSIS — R0602 Shortness of breath: Secondary | ICD-10-CM | POA: Diagnosis not present

## 2022-08-20 DIAGNOSIS — D631 Anemia in chronic kidney disease: Secondary | ICD-10-CM | POA: Diagnosis present

## 2022-08-20 DIAGNOSIS — F419 Anxiety disorder, unspecified: Secondary | ICD-10-CM | POA: Diagnosis not present

## 2022-08-20 DIAGNOSIS — G4700 Insomnia, unspecified: Secondary | ICD-10-CM | POA: Diagnosis not present

## 2022-08-20 DIAGNOSIS — I130 Hypertensive heart and chronic kidney disease with heart failure and stage 1 through stage 4 chronic kidney disease, or unspecified chronic kidney disease: Secondary | ICD-10-CM | POA: Diagnosis present

## 2022-08-20 DIAGNOSIS — I5082 Biventricular heart failure: Secondary | ICD-10-CM | POA: Diagnosis present

## 2022-08-20 DIAGNOSIS — R739 Hyperglycemia, unspecified: Secondary | ICD-10-CM | POA: Diagnosis not present

## 2022-08-20 DIAGNOSIS — E875 Hyperkalemia: Secondary | ICD-10-CM | POA: Diagnosis present

## 2022-08-20 DIAGNOSIS — E871 Hypo-osmolality and hyponatremia: Secondary | ICD-10-CM | POA: Diagnosis present

## 2022-08-20 DIAGNOSIS — R531 Weakness: Secondary | ICD-10-CM | POA: Diagnosis not present

## 2022-08-20 DIAGNOSIS — J9811 Atelectasis: Secondary | ICD-10-CM | POA: Diagnosis present

## 2022-08-20 DIAGNOSIS — I252 Old myocardial infarction: Secondary | ICD-10-CM

## 2022-08-20 DIAGNOSIS — R570 Cardiogenic shock: Secondary | ICD-10-CM | POA: Diagnosis not present

## 2022-08-20 DIAGNOSIS — E8809 Other disorders of plasma-protein metabolism, not elsewhere classified: Secondary | ICD-10-CM | POA: Diagnosis present

## 2022-08-20 DIAGNOSIS — N170 Acute kidney failure with tubular necrosis: Secondary | ICD-10-CM | POA: Diagnosis not present

## 2022-08-20 DIAGNOSIS — E663 Overweight: Secondary | ICD-10-CM | POA: Diagnosis present

## 2022-08-20 DIAGNOSIS — D649 Anemia, unspecified: Secondary | ICD-10-CM | POA: Diagnosis not present

## 2022-08-20 DIAGNOSIS — R079 Chest pain, unspecified: Secondary | ICD-10-CM | POA: Diagnosis not present

## 2022-08-20 DIAGNOSIS — Z833 Family history of diabetes mellitus: Secondary | ICD-10-CM

## 2022-08-20 DIAGNOSIS — Z951 Presence of aortocoronary bypass graft: Secondary | ICD-10-CM | POA: Diagnosis not present

## 2022-08-20 DIAGNOSIS — I5022 Chronic systolic (congestive) heart failure: Secondary | ICD-10-CM

## 2022-08-20 DIAGNOSIS — Z7984 Long term (current) use of oral hypoglycemic drugs: Secondary | ICD-10-CM

## 2022-08-20 DIAGNOSIS — N1831 Chronic kidney disease, stage 3a: Secondary | ICD-10-CM | POA: Diagnosis not present

## 2022-08-20 DIAGNOSIS — E8721 Acute metabolic acidosis: Secondary | ICD-10-CM | POA: Diagnosis present

## 2022-08-20 DIAGNOSIS — R197 Diarrhea, unspecified: Secondary | ICD-10-CM | POA: Diagnosis not present

## 2022-08-20 DIAGNOSIS — E872 Acidosis, unspecified: Secondary | ICD-10-CM

## 2022-08-20 DIAGNOSIS — Z8249 Family history of ischemic heart disease and other diseases of the circulatory system: Secondary | ICD-10-CM

## 2022-08-20 DIAGNOSIS — I472 Ventricular tachycardia, unspecified: Secondary | ICD-10-CM | POA: Diagnosis not present

## 2022-08-20 DIAGNOSIS — D72829 Elevated white blood cell count, unspecified: Secondary | ICD-10-CM | POA: Diagnosis present

## 2022-08-20 DIAGNOSIS — R0989 Other specified symptoms and signs involving the circulatory and respiratory systems: Secondary | ICD-10-CM | POA: Diagnosis not present

## 2022-08-20 DIAGNOSIS — Z8551 Personal history of malignant neoplasm of bladder: Secondary | ICD-10-CM

## 2022-08-20 DIAGNOSIS — I4729 Other ventricular tachycardia: Secondary | ICD-10-CM | POA: Diagnosis not present

## 2022-08-20 DIAGNOSIS — T82857A Stenosis of cardiac prosthetic devices, implants and grafts, initial encounter: Secondary | ICD-10-CM | POA: Diagnosis present

## 2022-08-20 DIAGNOSIS — R54 Age-related physical debility: Secondary | ICD-10-CM | POA: Diagnosis present

## 2022-08-20 DIAGNOSIS — K580 Irritable bowel syndrome with diarrhea: Secondary | ICD-10-CM | POA: Diagnosis present

## 2022-08-20 DIAGNOSIS — E785 Hyperlipidemia, unspecified: Secondary | ICD-10-CM | POA: Diagnosis present

## 2022-08-20 DIAGNOSIS — Z825 Family history of asthma and other chronic lower respiratory diseases: Secondary | ICD-10-CM

## 2022-08-20 LAB — COMPREHENSIVE METABOLIC PANEL
ALT: 40 U/L (ref 0–44)
AST: 66 U/L — ABNORMAL HIGH (ref 15–41)
Albumin: 4.2 g/dL (ref 3.5–5.0)
Alkaline Phosphatase: 66 U/L (ref 38–126)
Anion gap: 15 (ref 5–15)
BUN: 56 mg/dL — ABNORMAL HIGH (ref 8–23)
CO2: 20 mmol/L — ABNORMAL LOW (ref 22–32)
Calcium: 8.6 mg/dL — ABNORMAL LOW (ref 8.9–10.3)
Chloride: 99 mmol/L (ref 98–111)
Creatinine, Ser: 3.78 mg/dL — ABNORMAL HIGH (ref 0.61–1.24)
GFR, Estimated: 15 mL/min — ABNORMAL LOW (ref >60)
Glucose, Bld: 105 mg/dL — ABNORMAL HIGH (ref 70–99)
Potassium: 5.2 mmol/L — ABNORMAL HIGH (ref 3.5–5.1)
Sodium: 134 mmol/L — ABNORMAL LOW (ref 135–145)
Total Bilirubin: 0.7 mg/dL (ref 0.3–1.2)
Total Protein: 7.2 g/dL (ref 6.5–8.1)

## 2022-08-20 LAB — CBC WITH DIFFERENTIAL/PLATELET
Abs Immature Granulocytes: 0.03 10*3/uL (ref 0.00–0.07)
Basophils Absolute: 0 10*3/uL (ref 0.0–0.1)
Basophils Relative: 0 %
Eosinophils Absolute: 0 10*3/uL (ref 0.0–0.5)
Eosinophils Relative: 0 %
HCT: 38.1 % — ABNORMAL LOW (ref 39.0–52.0)
Hemoglobin: 12.3 g/dL — ABNORMAL LOW (ref 13.0–17.0)
Immature Granulocytes: 0 %
Lymphocytes Relative: 5 %
Lymphs Abs: 0.6 10*3/uL — ABNORMAL LOW (ref 0.7–4.0)
MCH: 31.1 pg (ref 26.0–34.0)
MCHC: 32.3 g/dL (ref 30.0–36.0)
MCV: 96.2 fL (ref 80.0–100.0)
Monocytes Absolute: 0.7 10*3/uL (ref 0.1–1.0)
Monocytes Relative: 6 %
Neutro Abs: 10.4 10*3/uL — ABNORMAL HIGH (ref 1.7–7.7)
Neutrophils Relative %: 89 %
Platelets: 114 10*3/uL — ABNORMAL LOW (ref 150–400)
RBC: 3.96 MIL/uL — ABNORMAL LOW (ref 4.22–5.81)
RDW: 14.6 % (ref 11.5–15.5)
WBC: 11.8 10*3/uL — ABNORMAL HIGH (ref 4.0–10.5)
nRBC: 0 % (ref 0.0–0.2)

## 2022-08-20 LAB — POC OCCULT BLOOD, ED: Fecal Occult Bld: POSITIVE — AB

## 2022-08-20 LAB — TROPONIN I (HIGH SENSITIVITY): Troponin I (High Sensitivity): 13275 ng/L (ref <18)

## 2022-08-20 LAB — LACTIC ACID, PLASMA: Lactic Acid, Venous: 2.3 mmol/L (ref 0.5–1.9)

## 2022-08-20 LAB — APTT: aPTT: 28 seconds (ref 24–36)

## 2022-08-20 LAB — LIPASE, BLOOD: Lipase: 38 U/L (ref 11–51)

## 2022-08-20 LAB — MRSA NEXT GEN BY PCR, NASAL: MRSA by PCR Next Gen: NOT DETECTED

## 2022-08-20 MED ORDER — AMIODARONE HCL IN DEXTROSE 360-4.14 MG/200ML-% IV SOLN
INTRAVENOUS | Status: AC
  Administered 2022-08-20: 60 mg/h
  Filled 2022-08-20: qty 200

## 2022-08-20 MED ORDER — PANTOPRAZOLE SODIUM 40 MG IV SOLR
40.0000 mg | Freq: Once | INTRAVENOUS | Status: AC
  Administered 2022-08-20: 40 mg via INTRAVENOUS
  Filled 2022-08-20: qty 10

## 2022-08-20 MED ORDER — TEMAZEPAM 15 MG PO CAPS: 15.0000 mg | ORAL_CAPSULE | Freq: Every evening | ORAL | Status: DC | PRN

## 2022-08-20 MED ORDER — DIVALPROEX SODIUM 250 MG PO DR TAB
250.0000 mg | DELAYED_RELEASE_TABLET | Freq: Two times a day (BID) | ORAL | Status: DC
  Administered 2022-08-20 – 2022-09-02 (×26): 250 mg via ORAL
  Filled 2022-08-20 (×27): qty 1

## 2022-08-20 MED ORDER — AMIODARONE IV BOLUS ONLY 150 MG/100ML
INTRAVENOUS | Status: AC
  Administered 2022-08-20: 150 mg
  Filled 2022-08-20: qty 100

## 2022-08-20 MED ORDER — CLOPIDOGREL BISULFATE 75 MG PO TABS
75.0000 mg | ORAL_TABLET | Freq: Every day | ORAL | Status: DC
  Administered 2022-08-21 – 2022-08-29 (×9): 75 mg via ORAL
  Filled 2022-08-20 (×10): qty 1

## 2022-08-20 MED ORDER — SODIUM CHLORIDE 0.9 % IV BOLUS
1000.0000 mL | Freq: Once | INTRAVENOUS | Status: AC
  Administered 2022-08-20: 1000 mL via INTRAVENOUS

## 2022-08-20 MED ORDER — LIDOCAINE BOLUS VIA INFUSION
100.0000 mg | Freq: Once | INTRAVENOUS | Status: AC
  Administered 2022-08-20: 100 mg via INTRAVENOUS
  Filled 2022-08-20: qty 100

## 2022-08-20 MED ORDER — AMIODARONE HCL IN DEXTROSE 360-4.14 MG/200ML-% IV SOLN
30.0000 mg/h | INTRAVENOUS | Status: DC
  Administered 2022-08-21 – 2022-08-25 (×8): 30 mg/h via INTRAVENOUS
  Filled 2022-08-20 (×8): qty 200

## 2022-08-20 MED ORDER — LIDOCAINE IN D5W 4-5 MG/ML-% IV SOLN
1.0000 mg/min | INTRAVENOUS | Status: DC
  Administered 2022-08-20 – 2022-08-21 (×2): 1 mg/min via INTRAVENOUS
  Filled 2022-08-20: qty 500

## 2022-08-20 MED ORDER — CHLORHEXIDINE GLUCONATE CLOTH 2 % EX PADS
6.0000 | MEDICATED_PAD | Freq: Every day | CUTANEOUS | Status: DC
  Administered 2022-08-20 – 2022-08-28 (×8): 6 via TOPICAL

## 2022-08-20 MED ORDER — SODIUM CHLORIDE 0.9 % IV SOLN: 250.0000 mL | INTRAVENOUS | Status: DC

## 2022-08-20 MED ORDER — AMIODARONE HCL IN DEXTROSE 360-4.14 MG/200ML-% IV SOLN
60.0000 mg/h | INTRAVENOUS | Status: AC
  Administered 2022-08-20 – 2022-08-21 (×2): 60 mg/h via INTRAVENOUS

## 2022-08-20 MED ORDER — NOREPINEPHRINE 4 MG/250ML-% IV SOLN
2.0000 ug/min | INTRAVENOUS | Status: DC
  Administered 2022-08-20: 3 ug/min via INTRAVENOUS
  Administered 2022-08-20: 1 ug/min via INTRAVENOUS
  Administered 2022-08-21: 7 ug/min via INTRAVENOUS
  Administered 2022-08-21: 4 ug/min via INTRAVENOUS
  Filled 2022-08-20 (×2): qty 250

## 2022-08-20 MED ORDER — CARBAMAZEPINE 200 MG PO TABS
400.0000 mg | ORAL_TABLET | Freq: Two times a day (BID) | ORAL | Status: DC
  Administered 2022-08-20 – 2022-08-26 (×13): 400 mg via ORAL
  Filled 2022-08-20 (×14): qty 2

## 2022-08-20 MED ORDER — NOREPINEPHRINE 4 MG/250ML-% IV SOLN
0.0000 ug/min | INTRAVENOUS | Status: DC
  Filled 2022-08-20: qty 250

## 2022-08-20 MED ORDER — HEPARIN (PORCINE) 25000 UT/250ML-% IV SOLN
1200.0000 [IU]/h | INTRAVENOUS | Status: AC
  Administered 2022-08-20 – 2022-08-21 (×4): 1200 [IU]/h via INTRAVENOUS
  Filled 2022-08-20 (×3): qty 250

## 2022-08-20 MED ORDER — LACTATED RINGERS IV SOLN: INTRAVENOUS | Status: DC

## 2022-08-20 MED ORDER — PANTOPRAZOLE SODIUM 40 MG PO TBEC
40.0000 mg | DELAYED_RELEASE_TABLET | Freq: Every day | ORAL | Status: DC
  Administered 2022-08-21 – 2022-08-29 (×9): 40 mg via ORAL
  Filled 2022-08-20 (×10): qty 1

## 2022-08-20 MED ORDER — ATORVASTATIN CALCIUM 80 MG PO TABS
80.0000 mg | ORAL_TABLET | Freq: Every day | ORAL | Status: DC
  Administered 2022-08-20 – 2022-08-29 (×10): 80 mg via ORAL
  Filled 2022-08-20: qty 1
  Filled 2022-08-20: qty 2
  Filled 2022-08-20: qty 1
  Filled 2022-08-20 (×3): qty 2
  Filled 2022-08-20 (×2): qty 1
  Filled 2022-08-20 (×2): qty 2

## 2022-08-20 MED ORDER — AMIODARONE IV BOLUS ONLY 150 MG/100ML: 150.0000 mg | Freq: Once | INTRAVENOUS | Status: AC

## 2022-08-20 MED ORDER — LIDOCAINE HCL (CARDIAC) PF 100 MG/5ML IV SOSY
PREFILLED_SYRINGE | INTRAVENOUS | Status: AC
  Filled 2022-08-20: qty 5

## 2022-08-20 MED ORDER — ASPIRIN 81 MG PO TBEC
81.0000 mg | DELAYED_RELEASE_TABLET | Freq: Every day | ORAL | Status: DC
  Administered 2022-08-21 – 2022-08-29 (×9): 81 mg via ORAL
  Filled 2022-08-20 (×10): qty 1

## 2022-08-20 MED ORDER — SODIUM CHLORIDE 0.9 % IV SOLN: INTRAVENOUS | Status: DC | PRN

## 2022-08-20 MED ORDER — CLOPIDOGREL BISULFATE 300 MG PO TABS
600.0000 mg | ORAL_TABLET | Freq: Once | ORAL | Status: AC
  Administered 2022-08-20: 600 mg via ORAL
  Filled 2022-08-20: qty 2

## 2022-08-20 NOTE — Significant Event (Signed)
I was paged that the patient was in a new sustained wide complex tachycardia. I immediately presented to the bedside. The patient was sitting upright and was in NAD. He further had no complaints of chest pain, SOB or LH. His VS were HR 120s and BP 80s/60s. His levophed requirement increased from 1 mcg to 3 mcg with this change in heart rhythm. I reviewed his telemetry which showed a clear WCT precipitated by a PVC c/w VT. I further interrogated his St Jude PPM which confirmed the diagnosis of VT. To treat the patient, I gave him 100 mg IV lidocaine + 150 mg bolus IV amiodarone + started a lidocaine gtt at 1 mg.min and IV amiodarone infusion. With these interventions he converted to a V paced rhythm. I ordered for an A line to be placed as well for closer hemodynamic monitoring.

## 2022-08-20 NOTE — Progress Notes (Signed)
 An USGPIV (ultrasound guided PIV) has been placed for short-term vasopressor infusion. A correctly placed ivWatch must be used when administering Vasopressors. Should this treatment be needed beyond 24 hours, central line access should be obtained.  It will be the responsibility of the bedside nurse to follow best practice to prevent extravasations.

## 2022-08-20 NOTE — ED Triage Notes (Signed)
Pt states chest pain is not hurting at the time but was hurting very bad this morning.

## 2022-08-20 NOTE — Procedures (Signed)
Arterial Catheter Insertion Procedure Note  TERRELLE SLINGERLAND  [id number]  [birth year]  Date:08/20/22  Time:10:56 PM    Provider Performing: Olegario Shearer C    Procedure: Insertion of Arterial Line (95621) without US guidance  Indication(s) Blood pressure monitoring and/or need for frequent ABGs  Consent Risks of the procedure as well as the alternatives and risks of each were explained to the patient and/or caregiver.  Consent for the procedure was obtained and is signed in the bedside chart  Anesthesia None   Time Out Verified patient identification, verified procedure, site/side was marked, verified correct patient position, special equipment/implants available, medications/allergies/relevant history reviewed, required imaging and test results available.   Sterile Technique Maximal sterile technique including full sterile barrier drape, hand hygiene, sterile gown, sterile gloves, mask, hair covering, sterile ultrasound probe cover (if used).   Procedure Description Area of catheter insertion was cleaned with chlorhexidine and draped in sterile fashion. Without real-time ultrasound guidance an arterial catheter was placed into the right radial artery.  Appropriate arterial tracings confirmed on monitor.     Complications/Tolerance None; patient tolerated the procedure well.   EBL Minimal   Specimen(s) None

## 2022-08-20 NOTE — Progress Notes (Signed)
eLink Physician-Brief Progress Note Patient Name: Grant Stevens DOB: [birth year] MRN: [id number]   Date of Service  08/20/2022  HPI/Events of Note  82 yr old male with ischemic cardiomyopathy admitted with diarrheal illness complicated by NSTEMI and acute renal failure.  He has PPM.  This evening he developed wide complex rhythm with elevated HR to 120's.  He is asymptomatic and bp relatively stable.  He has been on low dose levophed.  Cardiology has been notified.  ? Atrial arrythmia with aberrant condition vs slow VT.  eICU Interventions  Review rhythm with cardiology and consider antiarrythmic.       Intervention Category Major Interventions: Arrhythmia - evaluation and management  Henry Russel, P 08/20/2022, 9:16 PM

## 2022-08-20 NOTE — Consult Note (Signed)
Cardiology Consult:   Patient ID: Grant Stevens; MRN: [id number]; DOB: [birth year]   Admission date: 08/20/2022  Primary Care Provider: Carylon Perches, MD Primary Cardiologist: Dr. Diona Browner Primary Electrophysiologist:  Dr. Ladona Ridgel  Chief Complaint:  Diarrhea; consulted for NSTEMI  Patient Profile:   Grant Stevens is a 82 y.o. male with a history CAD (s/p CABG in 2002, no ischemia by NST in 11/2013), HFrEF/ICM (EF 30-35% by echo in 2018 and 10/2018, at 25-30% in 05/2020), SSS (s/p St. Jude Dual PPM in 05/2020), HTN and HLD who presents for NSTEMI in the setting of shock  History of Present Illness:   Grant Stevens is feeling surprisingly well. Notes that he has doing well.  Despite an LVEF 25%, need of PPM for heart block, he has dong well.  He has been working out at [employer] since his CABG recovery in 2022.  For the past three day he has had diarrhea.  Diarrhea was so bad it was uncontrollable Friday when he went to the gym.  Into this AM he has diarrhea, that also had chest pain.  Sudden onset chest pain that felt like his prior symptoms but without associated SOB.  Pain resolved.due to to the worsening diarrhea went to [facility].  Found to have initial troponin of 13,0009.  In the ED had hypotension, a lactate of 2.4, and needed levophed.  Has had no chest pain, chest pressure, chest tightness, chest stinging  No shortness of breath.  No PND or orthopnea.  No weight gain, leg swelling , or abdominal swelling.  No syncope or near syncope . Notes  no palpitations or funny heart beats.   He just has diarrhea (FOBT positive)  He has never heard of dialysis prior to this eval; has CKD that was creatinine 1+.   His sister is alive but recently fell down the stairs.  They get along fair.  He has a lady friend named Marzella Schlein.  If he were to need someone to make medical decisions because he couldn't it would be her.  Allergies:    Allergies  Allergen Reactions   Spironolactone Other (See  Comments)    Hyperkalemia    Social History:   Social History   Socioeconomic History   Marital status: Widowed    Spouse name: Not on file   Number of children: Not on file   Years of education: Not on file   Highest education level: Not on file  Occupational History   Not on file  Tobacco Use   Smoking status: Former    Current packs/day: 0.00    Average packs/day: 1 pack/day for 15.0 years (15.0 ttl pk-yrs)    Types: Cigarettes    Start date: 01/11/1956    Quit date: 01/11/1971    Years since quitting: 51.6    Passive exposure: Past   Smokeless tobacco: Never  Vaping Use   Vaping status: Never Used  Substance and Sexual Activity   Alcohol use: Not Currently    Comment: occ.   Drug use: No   Sexual activity: Not Currently  Other Topics Concern   Not on file  Social History Narrative   Married with no children    Exercises 4 times weekly   Caffeine once a day   Social Determinants of Health   Financial Resource Strain: Not on file  Food Insecurity: Not on file  Transportation Needs: Not on file  Physical Activity: Not on file  Stress: Not on file  Social  Connections: Not on file  Intimate Partner Violence: Not on file    Family History:   The patient's family history includes Asthma in his mother; Diabetes in his brother; Heart attack (age of onset: 57) in his father.    ROS:  Please see the history of present illness.   Physical Exam/Data:   Vitals:   08/20/22 1415 08/20/22 1500 08/20/22 1610 08/20/22 1630  BP: (!) 87/75 91/71 102/75 93/82  Pulse: 77 72    Resp: (!) 22 16 20 14   Temp:      TempSrc:      SpO2: (!) 82% 96% 97% 99%  Weight:      Height:        Intake/Output Summary (Last 24 hours) at 08/20/2022 1723 Last data filed at 08/20/2022 1600 Gross per 24 hour  Intake 1008.6 ml  Output --  Net 1008.6 ml   Filed Weights   08/20/22 1135  Weight: 98 kg   Body mass index is 29.3 kg/m.   Gen: no distress or pressor support, last BP  100/60, elderly male Neck: JVD at rest worse with HJR Ears: bilateral Grant Stevens Sign Cardiac: No Rubs or Gallops, systolic murmur, RRR +2 radial pulses Respiratory: Clear to auscultation bilaterally, normal effort, normal  respiratory rate GI: Soft, nontender, non-distended, hyperactive BS MS: No  edema;  moves all extremities Integument: Skin feels warm Neuro:  At time of evaluation, alert and oriented to person/place/time/situation  Psych: Normal affect, patient feels well    EKG:  The ECG that was done  was personally reviewed and demonstrates V paced rhythm  Relevant CV Studies:  Cardiac Studies & Procedures   CARDIAC CATHETERIZATION  CARDIAC CATHETERIZATION 05/12/2020  Narrative Successful placement of a temporary transvenous pacemaker in the RV  Plan: continue pacing. EP to evaluate for a permanent device. Will monitor in the ICU. Labs are pending.   STRESS TESTS  NM MYOCAR MULTI W/SPECT W 11/15/2013  Narrative CLINICAL DATA:  82 year old male with a known history of coronary artery disease referred for shortness of breath.  EXAM: MYOCARDIAL IMAGING WITH SPECT (REST AND EXERCISE)  GATED LEFT VENTRICULAR WALL MOTION STUDY  LEFT VENTRICULAR EJECTION FRACTION  TECHNIQUE: Standard myocardial SPECT imaging was performed after resting intravenous injection of 10 mCi Tc-26m sestamibi. Subsequently, exercise tolerance test was performed by the patient under the supervision of the Cardiology staff. At peak-stress, 30 mCi Tc-36m sestamibi was injected intravenously and standard myocardial SPECT imaging was performed. Quantitative gated imaging was also performed to evaluate left ventricular wall motion, and estimate left ventricular ejection fraction.  COMPARISON:  None.  FINDINGS: Exercise stress  Baseline EKG showed sinus rhythm with right bundle branch block, left anterior fascicular block, and 1st degree AV block (trifascular block), inferior and lateral precrodial  Q waves. The patient was exercised according to the Bruce protocol for 7 min 42 seconds achieving a work level of 10.1 Mets. The resting heart rate is 69 beats per min rose to a maximal rate of 141 beats per min, representing 95% of the maximal age predicted heart rate. The resting blood pressure of 128/70 increased to a maximum of 182/80. The test was stopped due to fatigue, the patient did not experience any chest pain. Stress EKG showed no specific ischemic changes and no significant arrhythmias though interpretation is somewhat limited by heavy artifact  Perfusion: There is a large fixed severe defect of the inferior, inferolateral, inferoseptal, inferoapical walls. There are no other myocardial perfusion defects.  Wall Motion: The  inferior wall is hypokinetic. The left ventricle is enlarged. There is no transient ischemic dilation.  Left Ventricular Ejection Fraction: 32 %  End diastolic volume 149 ml  End systolic volume 102 ml  IMPRESSION: 1. Large fixed scar of the entire inferior wall, there is no peri-infarct ischemia.  2. There is inferior wall hypokinesis. The left ventricle is dilated by volume.  3. Left ventricular ejection fraction 32%  4. Negative stress EKG for ischemia. Duke treadmill score of 7 consistent with low risk for major cardiac events. Excellent functional capacity (140% of predicted based on age and gender)  5. Overall high risk study for major cardiac events due to low ejection fraction, imaging shows no current myocardium at jeopardy. Consider correlating LVEF by echo. Exercise portion of test and Duke treadmill score suggest lower risk.  *2012 Appropriate Use Criteria for Coronary Revascularization Focused Update: J Am Coll Cardiol. 2012;59(9):857-881. http://content.dementiazones.com.aspx?articleid=1201161   Electronically Signed By: Dina Rich On: 11/15/2013 12:57   ECHOCARDIOGRAM  ECHOCARDIOGRAM COMPLETE  03/03/2022  Narrative ECHOCARDIOGRAM REPORT    Patient Name:   KALER BERNAU Date of Exam: 03/03/2022 Medical Rec #:  [id number]        Height:       72.0 in Accession #:    [id number]       Weight:       226.0 lb Date of Birth:  [birth year]         BSA:          2.244 m Patient Age:    81 years         BP:           125/73 mmHg Patient Gender: M                HR:           60 bpm. [facility]  Procedure: 2D Echo, Cardiac Doppler and Color Doppler  Indications:    I25.5 (ICD-10-CM) - Ischemic cardiomyopathy  History:        Patient has prior history of Echocardiogram examinations, most recent 05/12/2020. CHF, Previous Myocardial Infarction and CAD, Pacemaker, Arrythmias:Atrial Fibrillation and Heart block AV complete, Signs/Symptoms:Syncope; Risk Factors:Hypertension and Dyslipidemia.  Sonographer:    Celesta Gentile RCS Referring Phys: [phone] SAMUEL G MCDOWELL  IMPRESSIONS   1. Left ventricular ejection fraction, by estimation, is 25 to 30%. The left ventricle has severely decreased function. Left ventricular endocardial border not optimally defined to evaluate regional wall motion. The left ventricular internal cavity size was moderately dilated. Left ventricular diastolic parameters are consistent with Grade I diastolic dysfunction (impaired relaxation). 2. Right ventricular systolic function is mildly reduced. The right ventricular size is normal. Tricuspid regurgitation signal is inadequate for assessing PA pressure. 3. Left atrial size was moderately dilated. 4. Right atrial size was mildly dilated. 5. The mitral valve is abnormal. Trivial mitral valve regurgitation. No evidence of mitral stenosis. Severe mitral annular calcification. 6. The aortic valve is tricuspid. There is mild calcification of the aortic valve. Aortic valve regurgitation is trivial. No aortic stenosis is present. 7. The inferior vena cava is dilated in size with >50% respiratory variability,  suggesting right atrial pressure of 8 mmHg.  Comparison(s): No significant change from prior study.  FINDINGS Left Ventricle: Left ventricular ejection fraction, by estimation, is 25 to 30%. The left ventricle has severely decreased function. Left ventricular endocardial border not optimally defined to evaluate regional wall motion. The left ventricular internal cavity size was  moderately dilated. There is no left ventricular hypertrophy. Left ventricular diastolic parameters are consistent with Grade I diastolic dysfunction (impaired relaxation).  Right Ventricle: The right ventricular size is normal. No increase in right ventricular wall thickness. Right ventricular systolic function is mildly reduced. Tricuspid regurgitation signal is inadequate for assessing PA pressure.  Left Atrium: Left atrial size was moderately dilated.  Right Atrium: Right atrial size was mildly dilated.  Pericardium: There is no evidence of pericardial effusion.  Mitral Valve: The mitral valve is abnormal. Severe mitral annular calcification. Trivial mitral valve regurgitation. No evidence of mitral valve stenosis.  Tricuspid Valve: The tricuspid valve is normal in structure. Tricuspid valve regurgitation is not demonstrated. No evidence of tricuspid stenosis.  Aortic Valve: The aortic valve is tricuspid. There is mild calcification of the aortic valve. Aortic valve regurgitation is trivial. No aortic stenosis is present. Aortic valve mean gradient measures 8.0 mmHg. Aortic valve peak gradient measures 14.4 mmHg. Aortic valve area, by VTI measures 1.49 cm.  Pulmonic Valve: The pulmonic valve was not well visualized. Pulmonic valve regurgitation is not visualized. No evidence of pulmonic stenosis.  Aorta: The aortic root is normal in size and structure.  Venous: The inferior vena cava is dilated in size with greater than 50% respiratory variability, suggesting right atrial pressure of 8 mmHg.  IAS/Shunts: No  atrial level shunt detected by color flow Doppler.   LEFT VENTRICLE PLAX 2D LVIDd:         6.70 cm      Diastology LVIDs:         5.80 cm      LV e' medial:    4.35 cm/s LV PW:         1.10 cm      LV E/e' medial:  23.2 LV IVS:        1.10 cm      LV e' lateral:   7.29 cm/s LVOT diam:     2.00 cm      LV E/e' lateral: 13.9 LV SV:         69 LV SV Index:   31 LVOT Area:     3.14 cm  LV Volumes (MOD) LV vol d, MOD A2C: 132.0 ml LV vol d, MOD A4C: 200.0 ml LV vol s, MOD A2C: 96.6 ml LV vol s, MOD A4C: 136.0 ml LV SV MOD A2C:     35.4 ml LV SV MOD A4C:     200.0 ml LV SV MOD BP:      52.7 ml  RIGHT VENTRICLE RV S prime:     8.41 cm/s TAPSE (M-mode): 1.8 cm  LEFT ATRIUM              Index        RIGHT ATRIUM           Index LA diam:        5.60 cm  2.50 cm/m   RA Area:     22.00 cm LA Vol (A2C):   76.8 ml  34.22 ml/m  RA Volume:   70.10 ml  31.24 ml/m LA Vol (A4C):   103.0 ml 45.90 ml/m LA Biplane Vol: 96.2 ml  42.87 ml/m AORTIC VALVE AV Area (Vmax):    1.67 cm AV Area (Vmean):   1.71 cm AV Area (VTI):     1.49 cm AV Vmax:           190.00 cm/s AV Vmean:  131.000 cm/s AV VTI:            0.465 m AV Peak Grad:      14.4 mmHg AV Mean Grad:      8.0 mmHg LVOT Vmax:         101.00 cm/s LVOT Vmean:        71.300 cm/s LVOT VTI:          0.220 m LVOT/AV VTI ratio: 0.47  AORTA Ao Root diam: 3.70 cm  MITRAL VALVE MV Area (PHT): 4.31 cm     SHUNTS MV Decel Time: 176 msec     Systemic VTI:  0.22 m MV E velocity: 101.00 cm/s  Systemic Diam: 2.00 cm MV A velocity: 78.40 cm/s MV E/A ratio:  1.29  Vishnu Priya Mallipeddi Electronically signed by Winfield Rast Mallipeddi Signature Date/Time: 03/03/2022/2:31:43 PM    Final             Laboratory Data:  Chemistry Recent Labs  Lab 08/20/22 1224  NA 134*  K 5.2*  CL 99  CO2 20*  GLUCOSE 105*  BUN 56*  CREATININE 3.78*  CALCIUM 8.6*  GFRNONAA 15*  ANIONGAP 15    Recent Labs  Lab 08/20/22 1224   PROT 7.2  ALBUMIN 4.2  AST 66*  ALT 40  ALKPHOS 66  BILITOT 0.7   Hematology Recent Labs  Lab 08/20/22 1224  WBC 11.8*  RBC 3.96*  HGB 12.3*  HCT 38.1*  MCV 96.2  MCH 31.1  MCHC 32.3  RDW 14.6  PLT 114*   Cardiac EnzymesNo results for input(s): "TROPONINI" in the last 168 hours. No results for input(s): "TROPIPOC" in the last 168 hours.  BNPNo results for input(s): "BNP", "PROBNP" in the last 168 hours.  DDimer No results for input(s): "DDIMER" in the last 168 hours.   Assessment and Plan:    NSTEMI Unclear if Type I or Type II; distant LIMA to LAD, SVG to diagonal, and SVG to PDA  - unable to cath at this time - no bloody diarrhea - DAPT load and heparin drip - holding home bisoprolol - on home statin  SCAI C Cardiogenic Shock; vs mixed picture from diarrhea syndrome - no true infections sequelae - NYHA class I, Stage D, hypervolemic on exam - holding ARNI, BB, and SGLT2i - Pending Central Line, will get Lactate, Venous Co-ox, and procalcitonin - if low, would start 0.25 milrinone and alert the shock team; AHF aware patient is here - echo pending   - Complete HB - s/p DDD  Diarrhea - unclear etiology, testing is pending  Discussed with PCCM, IC on call, and AHF team Attempted to reach Marzella Schlein, patient SO  CRITICAL CARE Performed by:  A   Total critical care time: 80 minutes. Critical care time was exclusive of separately billable procedures and treating other patients. Critical care was necessary to treat or prevent imminent or life-threatening deterioration. Critical care was time spent personally by me on the following activities: development of treatment plan with patient and/or surrogate as well as nursing, discussions with consultants, evaluation of patient's response to treatment, examination of patient, obtaining history from patient or surrogate, ordering and performing treatments and interventions, ordering and review of  laboratory studies, ordering and review of radiographic studies, pulse oximetry and re-evaluation of patient's condition.    Signed, Riley Lam, MD FASE [facility] [place]  [facility]  08/20/2022 5:48 PM        For questions or updates, please contact [facility] Please consult [email or web address] for contact info under Cardiology/STEMI.   Riley Lam, MD FASE [facility] Cardiologist [facility]  [address] [place], [address] [place], [place] [address] [phone]  5:23 PM

## 2022-08-20 NOTE — Procedures (Signed)
Central Venous Catheter Insertion Procedure Note  Grant Stevens  [id number]  Jun 08, 1940  Date:08/20/22  Time:8:16 PM   Provider Performing: D Suzie Portela   Procedure: Insertion of Non-tunneled Central Venous [phone]) with US guidance ([id number])   Indication(s) Medication administration  Consent Risks of the procedure as well as the alternatives and risks of each were explained to the patient and/or caregiver.  Consent for the procedure was obtained and is signed in the bedside chart  Anesthesia Topical only with 1% lidocaine   Timeout Verified patient identification, verified procedure, site/side was marked, verified correct patient position, special equipment/implants available, medications/allergies/relevant history reviewed, required imaging and test results available.  Sterile Technique Maximal sterile technique including full sterile barrier drape, hand hygiene, sterile gown, sterile gloves, mask, hair covering, sterile ultrasound probe cover (if used).  Procedure Description Area of catheter insertion was cleaned with chlorhexidine and draped in sterile fashion.  With real-time ultrasound guidance a central venous catheter was placed into the left internal jugular vein. Nonpulsatile blood flow and easy flushing noted in all ports.  The catheter was sutured in place and sterile dressing applied.  Complications/Tolerance None; patient tolerated the procedure well. Chest X-ray is ordered to verify placement for internal jugular or subclavian cannulation.   Chest x-ray is not ordered for femoral cannulation.  EBL Minimal  Specimen(s) None  [name] [facility] 08/20/2022, 8:17 PM  Please see [email or web address] for pager details.  From 7A-7P if no response, please call [phone]. After hours, please call ELink [phone].

## 2022-08-20 NOTE — Progress Notes (Signed)
ANTICOAGULATION CONSULT NOTE - Initial Consult  Pharmacy Consult for Heparin Indication: chest pain/ACS  Allergies  Allergen Reactions   Spironolactone Other (See Comments)    Hyperkalemia    Patient Measurements: Height: 6' (182.9 cm) Weight: 98 kg (216 lb 0.8 oz) IBW/kg (Calculated) : 77.6 HEPARIN DW (KG): 97.3   Vital Signs: Temp: 98.7 F (37.1 C) (08/10 1139) Temp Source: Oral (08/10 1139) BP: 87/75 (08/10 1415) Pulse Rate: 77 (08/10 1415)  Labs: Recent Labs    08/20/22 1224  HGB 12.3*  HCT 38.1*  PLT 114*  CREATININE 3.78*  TROPONINIHS 13,275*    Estimated Creatinine Clearance: 18.3 mL/min (A) (by C-G formula based on SCr of 3.78 mg/dL (H)).   Medical History: Past Medical History:  Diagnosis Date   Allergic rhinitis    Arthritis    Asthma    Childhood   Atrial fibrillation (HCC)    Remote history - WARCEF   Bladder cancer (HCC)    BPH (benign prostatic hyperplasia)    Cardiomyopathy (HCC)    CHF (congestive heart failure) (HCC)    a. EF 30-35% by echo in 2018 and 10/2018, at 25-30% in 05/2020   Coronary atherosclerosis of native coronary artery    Multivessel s/p CABG in 2002 post-IMI, LVEF 35% up to 50% postoperatively;   Depression    Difficulty sleeping    Erectile dysfunction    Essential hypertension    Frequency of urination    History of bipolar disorder    Hyperlipidemia    IBS (irritable bowel syndrome)    Myocardial infarction (HCC) 2002    Medications:  See med rec  Assessment: 82 y.o. male.  He has a history of cardiac disease, cardiomyopathy, pacemaker, CHF, chronic constipation and diarrhea, dysphagia.  He said he has had on and off diarrhea for the last few days. Patient had chest pain yesterday. Not on oral anticoagulants. MD asked pharmacy to start heparin without bolus. Followed by cardiology in OP clinic.  Goal of Therapy:  Heparin level 0.3-0.7 units/ml Monitor platelets by anticoagulation protocol: Yes   Plan:   Start heparin infusion at 1200 units/hr Check anti-Xa level in ~8 hours and daily while on heparin Continue to monitor H&H and platelets  Elder Cyphers, BS Pharm D, BCPS Clinical Pharmacist 08/20/2022,2:54 PM

## 2022-08-20 NOTE — H&P (Signed)
NAME:  Grant Stevens, MRN:  [id number], DOB:  [birth year], LOS: 0 ADMISSION DATE:  08/20/2022, CONSULTATION DATE:  08/20/2022 REFERRING MD:  Dr. Charm Barges, ER, CHIEF COMPLAINT:  Chest pain   History of Present Illness:  82 yo male former smoker with hx of CAD s/p CABG, ischemic CM with HFrEF (EF 25%), and conduction system disease s/p PM presented to [facility] ER after developing diarrhea on 08/19/22.  He had chest pain about 3 days prior to admission.  He described this as mid-sternal, lasting about 2 hours, and associated with dyspnea and diaphoresis.  He said it felt like his chest pain before his heart attack several years ago.  He didn't take any nitroglycerin.  The pain and dyspnea eventually resolve.  In the ER he was found to have AKI, mild hyperkalemia, significant elevation in troponin, mild lactic acidosis, hypotension.  Started on heparin and levophed gtt.  Transferred to [facility] for further cardiac management and PCCM consulted to admit to ICU.  Pertinent  Medical History  Allergies, Arthritis, A fib, Bladder cancer, BPH, HFrEF, CAD s/p CABG, Depression, ED, HTN, Bipolar, HLD, IBS, s/p PM  Significant Hospital Events: Including procedures, antibiotic start and stop dates in addition to other pertinent events   8/10 transfer from [facility] ER to [facility], start heparin and levophed gtt, cardiology consulted  Interim History / Subjective:  Has diarrhea for years.  Not having chest pain or dyspnea at present.  Denies nausea of abdominal pain.  Had duct tape around left hand fingers to help with a wart.  Objective   Blood pressure (!) 87/75, pulse 77, temperature 98.7 F (37.1 C), temperature source Oral, resp. rate (!) 22, height 6' (1.829 m), weight 98 kg, SpO2 (!) 82%.       No intake or output data in the 24 hours ending 08/20/22 1528 Filed Weights   08/20/22 1135  Weight: 98 kg    Examination:  General - alert Eyes - pupils reactive ENT - no sinus tenderness, no stridor Cardiac - regular  rate/rhythm, no murmur Chest - equal breath sounds b/l, no wheezing or rales Abdomen - soft, non tender, + bowel sounds Extremities - no cyanosis, clubbing, or edema Skin - no rashes Neuro - normal strength, moves extremities, follows commands Psych - normal mood and behavior Lymphatics - no lymphadenopathy  Resolved Hospital Problem list     Assessment & Plan:   NSTEMI with hx of CAD s/p CABG. Acute on chronic HFrEF. Shock. Hx of HLD, HTN. - continue heparin gtt - f/u Echo, BNP - cardiology consulted - pressors to keep MAP > 65, SBP > 90 - continue ASA, lipitor - hold outpt zebeta, jardiance, entresto, lasix  AKI in setting of shock. Mild hyperkalemia. Hx of BPH, Bladder cancer. - baseline creatinine 1.11 from 02/28/22 - lactic ringers IV fluid at 50 ml/hr - check FeNA, renal ultrasound - f/u BMET, monitor urine outpt - hold outpt rapaflo  Lactic acidosis. - follow up lactic acid level  Intermittent diarrhea with hx of IBS. Hemo-occult positive stool >> performed in ER. Hx of GERD. - f/u Hb - continue protonix - f/u stool C diff, GI panel  Hx of Bipolar disease. - continue tegretol, depakote  Best Practice (right click and "Reselect all SmartList Selections" daily)   Diet/type: Regular consistency (see orders) DVT prophylaxis: systemic heparin GI prophylaxis: PPI Lines: N/A Foley:  N/A Code Status:  full code Last date of multidisciplinary goals of care discussion [x]   Labs   CBC:  Recent Labs  Lab 08/20/22 1224  WBC 11.8*  NEUTROABS 10.4*  HGB 12.3*  HCT 38.1*  MCV 96.2  PLT 114*    Basic Metabolic Panel: Recent Labs  Lab 08/20/22 1224  NA 134*  K 5.2*  CL 99  CO2 20*  GLUCOSE 105*  BUN 56*  CREATININE 3.78*  CALCIUM 8.6*   GFR: Estimated Creatinine Clearance: 18.3 mL/min (A) (by C-G formula based on SCr of 3.78 mg/dL (H)). Recent Labs  Lab 08/20/22 1224  WBC 11.8*  LATICACIDVEN 2.3*    Liver Function Tests: Recent Labs   Lab 08/20/22 1224  AST 66*  ALT 40  ALKPHOS 66  BILITOT 0.7  PROT 7.2  ALBUMIN 4.2   Recent Labs  Lab 08/20/22 1224  LIPASE 38   No results for input(s): "AMMONIA" in the last 168 hours.  ABG    Component Value Date/Time   TCO2 27 05/12/2020 1102     Coagulation Profile: No results for input(s): "INR", "PROTIME" in the last 168 hours.  Cardiac Enzymes: No results for input(s): "CKTOTAL", "CKMB", "CKMBINDEX", "TROPONINI" in the last 168 hours.  HbA1C: Hgb A1c MFr Bld  Date/Time Value Ref Range Status  05/12/2020 11:01 AM 5.7 (H) 4.8 - 5.6 % Final    Comment:    (NOTE) Pre diabetes:          5.7%-6.4%  Diabetes:              >6.4%  Glycemic control for   <7.0% adults with diabetes     CBG: No results for input(s): "GLUCAP" in the last 168 hours.  Review of Systems:   Reviewed and negative  Past Medical History:  He,  has a past medical history of Allergic rhinitis, Arthritis, Asthma, Atrial fibrillation (HCC), Bladder cancer (HCC), BPH (benign prostatic hyperplasia), Cardiomyopathy (HCC), CHF (congestive heart failure) (HCC), Coronary atherosclerosis of native coronary artery, Depression, Difficulty sleeping, Erectile dysfunction, Essential hypertension, Frequency of urination, History of bipolar disorder, Hyperlipidemia, IBS (irritable bowel syndrome), and Myocardial infarction (HCC) (2002).   Surgical History:   Past Surgical History:  Procedure Laterality Date   BIOPSY  03/28/2018   Procedure: BIOPSY;  Surgeon: Malissa Hippo, MD;  Location: [facility];  Service: Endoscopy;;  esophagus   BIOPSY  03/09/2021   Procedure: BIOPSY;  Surgeon: Dolores Frame, MD;  Location: [facility];  Service: Gastroenterology;;   BIV PACEMAKER INSERTION CRT-P N/A 05/12/2020   Procedure: BIV PACEMAKER INSERTION CRT-P;  Surgeon: Hillis Range, MD;  Location: [facility] INVAS[facility] CV LAB;  Service: Cardiovascular;  Laterality: N/A;   CATARACT EXTRACTION W/PHACO Left  12/29/2014   Procedure: CATARACT EXTRACTION PHACO AND INTRAOCULAR LENS PLACEMENT (IOC);  Surgeon: Susa Simmonds, MD;  Location: [facility];  Service: Ophthalmology;  Laterality: Left;  CDE:3.71   CATARACT EXTRACTION W/PHACO Right 03/30/2015   Procedure: CATARACT EXTRACTION PHACO AND INTRAOCULAR LENS PLACEMENT RIGHT EYE CDE=2.56;  Surgeon: Susa Simmonds, MD;  Location: [facility];  Service: Ophthalmology;  Laterality: Right;   CIRCUMCISION  10/11/2003   COLONOSCOPY N/A 07/11/2012   rehman,transverse colon polyp (benign lymphoid); tortuous colon; prep adequate with much suction/lavage; hemorrhoids.   CORONARY ARTERY BYPASS GRAFT  08/10/2000   Dr. Tyrone Sage - LIMA to LAD, SVG to diagonal, SVG to PDA TRIPLE BYPASS   CYSTOSCOPY W/ RETROGRADES Bilateral 03/02/2015   Procedure: CYSTOSCOPY WITH RIGHT RETROGRADE PYELOGRAM,  ATTEMPTED LEFT RETROGRADE PYELOGRAM;  Surgeon: Malen Gauze, MD;  Location: [facility];  Service: Urology;  Laterality: Bilateral;  ESOPHAGEAL DILATION N/A 03/28/2018   Procedure: ESOPHAGEAL DILATION;  Surgeon: Malissa Hippo, MD;  [facility]: [facility];  Service: Endoscopy;  Laterality: N/A;   ESOPHAGEAL DILATION N/A 03/15/2022   Procedure: ESOPHAGEAL DILATION;  Surgeon: Dolores Frame, MD;  [facility]: [facility];  Service: Gastroenterology;  Laterality: N/A;   ESOPHAGOGASTRODUODENOSCOPY N/A 03/28/2018   rehman,Abnormal esophageal motility. mild schatzki ring at GEJ, dilated, patch of salmon colored mucosa at distal esophagus. Barrett's esophagus. 2cm HH. erosive gastropathy, normal pylorus, duodenal erosions w/o bleeding. normal second portion of duodenum   ESOPHAGOGASTRODUODENOSCOPY (EGD) WITH PROPOFOL N/A 03/09/2021   Procedure: ESOPHAGOGASTRODUODENOSCOPY (EGD) WITH PROPOFOL;  Surgeon: Dolores Frame, MD;  [facility];  Service: Gastroenterology;  Laterality: N/A;  945   ESOPHAGOGASTRODUODENOSCOPY (EGD) WITH PROPOFOL N/A 03/15/2022   Procedure:  ESOPHAGOGASTRODUODENOSCOPY (EGD) WITH PROPOFOL;  Surgeon: Dolores Frame, MD;  [facility]: [facility];  Service: Gastroenterology;  Laterality: N/A;  1045am, asa 3   TEMPORARY PACEMAKER N/A 05/12/2020   Procedure: TEMPORARY PACEMAKER;  Surgeon: Swaziland, Peter M, MD;  Location: [facility] INVASIVE CV LAB;  Service: Cardiovascular;  Laterality: N/A;   TONSILLECTOMY     TRANSURETHRAL RESECTION OF BLADDER TUMOR N/A 01/26/2015   Procedure: TRANSURETHRAL RESECTION OF BLADDER TUMOR (TURBT);  Surgeon: Malen Gauze, MD;  Location: [facility];  Service: Urology;  Laterality: N/A;   TRANSURETHRAL RESECTION OF BLADDER TUMOR WITH GYRUS (TURBT-GYRUS) N/A 03/02/2015   Procedure: TRANSURETHRAL RESECTION OF BLADDER TUMOR WITH GYRUS (TURBT-GYRUS);  Surgeon: Malen Gauze, MD;  Location: [facility];  Service: Urology;  Laterality: N/A;   TRANSURETHRAL RESECTION OF PROSTATE N/A 01/26/2015   Procedure: TRANSURETHRAL RESECTION OF THE PROSTATE WITH GYRUS INSTRUMENTS;  Surgeon: Malen Gauze, MD;  Location: [facility];  Service: Urology;  Laterality: N/A;     Social History:   reports that he quit smoking about 51 years ago. His smoking use included cigarettes. He started smoking about 66 years ago. He has a 15 pack-year smoking history. He has been exposed to tobacco smoke. He has never used smokeless tobacco. He reports that he does not currently use alcohol. He reports that he does not use drugs.   Family History:  His family history includes Asthma in his mother; Diabetes in his brother; Heart attack (age of onset: 15) in his father.   Allergies Allergies  Allergen Reactions   Spironolactone Other (See Comments)    Hyperkalemia     Home Medications  Prior to Admission medications   Medication Sig Start Date End Date Taking? Authorizing Provider  aspirin EC 81 MG tablet Take 1 tablet (81 mg total) by mouth daily. 03/31/18   Rehman, Joline Maxcy, MD  atorvastatin (LIPITOR) 80 MG tablet Take 1 tablet (80 mg  total) by mouth at bedtime. 01/19/22   Jonelle Sidle, MD  B Complex-C (SUPER B COMPLEX PO) Take 1 capsule by mouth daily.    [provider]  bisoprolol (ZEBETA) 5 MG tablet TAKE 1/2 TABLET(2.5 MG) BY MOUTH DAILY 03/28/22   Jonelle Sidle, MD  carbamazepine (TEGRETOL) 200 MG tablet Take 400 mg by mouth 2 (two) times daily.     [provider]  divalproex (DEPAKOTE) 250 MG DR tablet Take 250 mg by mouth 2 (two) times daily. 02/08/22   [provider]  empagliflozin (JARDIANCE) 10 MG TABS tablet Take 1 tablet (10 mg total) by mouth daily before breakfast. 07/27/22   Jonelle Sidle, MD  ENTRESTO 24-26 MG TAKE 1 TABLET  BY MOUTH TWICE DAILY 04/25/22   Jonelle Sidle, MD  EPINEPHrine 0.3 mg/0.3 mL IJ SOAJ injection Inject 0.3 mg into the muscle as needed for anaphylaxis. 09/13/21   Gilda Crease, MD  famotidine (PEPCID) 40 MG tablet TAKE (1) TABLET BY MOUTH AT BEDTIME. 03/08/22   Carlan, Chelsea L, NP  furosemide (LASIX) 40 MG tablet Take 0.5-1 tablets (20-40 mg total) by mouth daily. 20mg  one day then 40mg  the other day alternating 20mg -40mg  01/28/22   Jonelle Sidle, MD  pantoprazole (PROTONIX) 40 MG tablet Take 1 tablet (40 mg total) by mouth daily before breakfast. 12/16/21   Carlan, Chelsea L, NP  silodosin (RAPAFLO) 8 MG CAPS capsule Take 1 capsule (8 mg total) by mouth daily with breakfast. 01/28/22   McKenzie, Mardene Celeste, MD  temazepam (RESTORIL) 15 MG capsule Take 15 mg by mouth at bedtime as needed for sleep. 03/01/22   [provider]     Critical care time: 38 minutes  Coralyn Helling, MD [facility] Pulmonary/Critical Care Pager - [phone] or [phone] 08/20/2022, 4:31 PM

## 2022-08-20 NOTE — ED Triage Notes (Signed)
Pt complains of diarrhea x 3 days. Today started to have chest pain. Denies any nausea vomiting.

## 2022-08-20 NOTE — ED Provider Notes (Signed)
[facility] AT [facility] Provider Note   CSN: [id number] Arrival date & time: 08/20/22  1037     History  Chief Complaint  Patient presents with   Diarrhea   Chest Pain   Hypotension    Grant Stevens is a 82 y.o. male.  He has a history of cardiac disease, cardiomyopathy, pacemaker, CHF, A-fib, chronic constipation and diarrhea, dysphagia.  He said he has had on and off diarrhea for the last few days.  Happened again after he returned from church today.  Is also felt rather weak and lightheaded.  Also had an episode of chest pain he thinks maybe yesterday but none today.  No abdominal pain no vomiting no fever.  No shortness of breath.  He said he follows with GI doctor and is supposed to have a colonoscopy.  The history is provided by the patient.  Diarrhea Quality:  Watery Severity:  Moderate Onset quality:  Gradual Number of episodes:  3 Duration:  2 days Timing:  Sporadic Progression:  Unchanged Relieved by:  Nothing Worsened by:  Nothing Ineffective treatments:  None tried Associated symptoms: no abdominal pain, no recent cough, no diaphoresis, no fever and no vomiting   Chest Pain Associated symptoms: no abdominal pain, no diaphoresis, no fever, no nausea, no shortness of breath and no vomiting        Home Medications Prior to Admission medications   Medication Sig Start Date End Date Taking? Authorizing Provider  aspirin EC 81 MG tablet Take 1 tablet (81 mg total) by mouth daily. 03/31/18   Rehman, Joline Maxcy, MD  atorvastatin (LIPITOR) 80 MG tablet Take 1 tablet (80 mg total) by mouth at bedtime. 01/19/22   Jonelle Sidle, MD  B Complex-C (SUPER B COMPLEX PO) Take 1 capsule by mouth daily.    [provider]  bisoprolol (ZEBETA) 5 MG tablet TAKE 1/2 TABLET(2.5 MG) BY MOUTH DAILY 03/28/22   Jonelle Sidle, MD  carbamazepine (TEGRETOL) 200 MG tablet Take 400 mg by mouth 2 (two) times daily.     [provider]   divalproex (DEPAKOTE) 250 MG DR tablet Take 250 mg by mouth 2 (two) times daily. 02/08/22   [provider]  empagliflozin (JARDIANCE) 10 MG TABS tablet Take 1 tablet (10 mg total) by mouth daily before breakfast. 07/27/22   Jonelle Sidle, MD  ENTRESTO 24-26 MG TAKE 1 TABLET BY MOUTH TWICE DAILY 04/25/22   Jonelle Sidle, MD  EPINEPHrine 0.3 mg/0.3 mL IJ SOAJ injection Inject 0.3 mg into the muscle as needed for anaphylaxis. 09/13/21   Gilda Crease, MD  famotidine (PEPCID) 40 MG tablet TAKE (1) TABLET BY MOUTH AT BEDTIME. 03/08/22   Carlan, Chelsea L, NP  furosemide (LASIX) 40 MG tablet Take 0.5-1 tablets (20-40 mg total) by mouth daily. 20mg  one day then 40mg  the other day alternating 20mg -40mg  01/28/22   Jonelle Sidle, MD  pantoprazole (PROTONIX) 40 MG tablet Take 1 tablet (40 mg total) by mouth daily before breakfast. 12/16/21   Carlan, Chelsea L, NP  silodosin (RAPAFLO) 8 MG CAPS capsule Take 1 capsule (8 mg total) by mouth daily with breakfast. 01/28/22   McKenzie, Mardene Celeste, MD  temazepam (RESTORIL) 15 MG capsule Take 15 mg by mouth at bedtime as needed for sleep. 03/01/22   [provider]      Allergies    Spironolactone    Review of Systems   Review of Systems  Constitutional:  Negative for diaphoresis and fever.  Respiratory:  Negative for shortness of breath.   Cardiovascular:  Positive for chest pain.  Gastrointestinal:  Positive for diarrhea. Negative for abdominal pain, nausea and vomiting.  Genitourinary:  Negative for dysuria.    Physical Exam Updated Vital Signs BP (!) 63/50 (BP Location: Right Arm)   Pulse 66   Temp 98.7 F (37.1 C) (Oral)   Resp 18   Ht 6' (1.829 m)   Wt 98 kg   SpO2 97%   BMI 29.30 kg/m  Physical Exam Vitals and nursing note reviewed.  Constitutional:      General: He is not in acute distress.    Appearance: He is well-developed.  HENT:     Head: Normocephalic and atraumatic.  Eyes:      Conjunctiva/sclera: Conjunctivae normal.  Cardiovascular:     Rate and Rhythm: Normal rate and regular rhythm.     Heart sounds: No murmur heard. Pulmonary:     Effort: Pulmonary effort is normal. No respiratory distress.     Breath sounds: Normal breath sounds.  Abdominal:     Palpations: Abdomen is soft.     Tenderness: There is no abdominal tenderness.  Musculoskeletal:        General: No swelling. Normal range of motion.     Cervical back: Neck supple.     Right lower leg: No tenderness. Edema present.     Left lower leg: No tenderness. Edema present.  Skin:    General: Skin is warm and dry.     Capillary Refill: Capillary refill takes less than 2 seconds.  Neurological:     General: No focal deficit present.     Mental Status: He is alert.     ED Results / Procedures / Treatments   Labs (all labs ordered are listed, but only abnormal results are displayed) Labs Reviewed  COMPREHENSIVE METABOLIC PANEL - Abnormal; Notable for the following components:      Result Value   Sodium 134 (*)    Potassium 5.2 (*)    CO2 20 (*)    Glucose, Bld 105 (*)    BUN 56 (*)    Creatinine, Ser 3.78 (*)    Calcium 8.6 (*)    AST 66 (*)    GFR, Estimated 15 (*)    All other components within normal limits  CBC WITH DIFFERENTIAL/PLATELET - Abnormal; Notable for the following components:   WBC 11.8 (*)    RBC 3.96 (*)    Hemoglobin 12.3 (*)    HCT 38.1 (*)    Platelets 114 (*)    Neutro Abs 10.4 (*)    Lymphs Abs 0.6 (*)    All other components within normal limits  LACTIC ACID, PLASMA - Abnormal; Notable for the following components:   Lactic Acid, Venous 2.3 (*)    All other components within normal limits  POC OCCULT BLOOD, ED - Abnormal; Notable for the following components:   Fecal Occult Bld POSITIVE (*)    All other components within normal limits  TROPONIN I (HIGH SENSITIVITY) - Abnormal; Notable for the following components:   Troponin I (High Sensitivity) 13,275 (*)     All other components within normal limits  CULTURE, BLOOD (ROUTINE X 2)  CULTURE, BLOOD (ROUTINE X 2)  GASTROINTESTINAL PANEL BY PCR, STOOL (REPLACES STOOL CULTURE)  MRSA NEXT GEN BY PCR, NASAL  LIPASE, BLOOD  APTT  URINALYSIS, ROUTINE W REFLEX MICROSCOPIC  SODIUM, URINE, RANDOM  CREATININE, URINE, RANDOM  HEPARIN LEVEL (UNFRACTIONATED)  CBC  BASIC METABOLIC PANEL  BRAIN NATRIURETIC PEPTIDE  LACTIC ACID, PLASMA    EKG EKG Interpretation Date/Time:  Saturday August 20 2022 11:46:28 EDT Ventricular Rate:  71 PR Interval:    QRS Duration:  247 QT Interval:  571 QTC Calculation: 621 R Axis:   266  Text Interpretation: Ventricular-paced rhythm Confirmed by Meridee Score [phone]) on 08/20/2022 11:48:20 AM  Radiology DG Chest Port 1 View  Result Date: 08/20/2022 CLINICAL DATA:  82 year old male with history of chest pain. Diarrhea for the past 3 days. EXAM: PORTABLE CHEST 1 VIEW COMPARISON:  Chest x-ray 07/15/2021. FINDINGS: Lung volumes are slightly low. No consolidative airspace disease. No pleural effusions. No pneumothorax. No pulmonary nodule or mass noted. Pulmonary vasculature and the cardiomediastinal silhouette are within normal limits. Atherosclerotic calcifications in the thoracic aorta. Left-sided pacemaker device in place with lead tips projecting over the expected location of the right atrium and right ventricle. Status post median sternotomy for CABG. IMPRESSION: 1. No radiographic evidence of acute cardiopulmonary disease. 2. Aortic atherosclerosis. Electronically Signed   By: Trudie Reed M.D.   On: 08/20/2022 12:22    Procedures .Critical Care  Performed by: Terrilee Files, MD Authorized by: Terrilee Files, MD   Critical care provider statement:    Critical care time (minutes):  45   Critical care time was exclusive of:  Separately billable procedures and treating other patients   Critical care was necessary to treat or prevent imminent or  life-threatening deterioration of the following conditions:  Cardiac failure, circulatory failure, shock and renal failure   Critical care was time spent personally by me on the following activities:  Development of treatment plan with patient or surrogate, discussions with consultants, evaluation of patient's response to treatment, examination of patient, obtaining history from patient or surrogate, ordering and performing treatments and interventions, ordering and review of laboratory studies, ordering and review of radiographic studies, pulse oximetry, re-evaluation of patient's condition and review of old charts   I assumed direction of critical care for this patient from another provider in my specialty: no       Medications Ordered in ED Medications  heparin ADULT infusion 100 units/mL (25000 units/232mL) (0 Units/hr Intravenous Stopped 08/20/22 1602)  aspirin EC tablet 81 mg (has no administration in time range)  atorvastatin (LIPITOR) tablet 80 mg (has no administration in time range)  carbamazepine (TEGRETOL) tablet 400 mg (has no administration in time range)  divalproex (DEPAKOTE) DR tablet 250 mg (has no administration in time range)  pantoprazole (PROTONIX) EC tablet 40 mg (has no administration in time range)  temazepam (RESTORIL) capsule 15 mg (has no administration in time range)  Chlorhexidine Gluconate Cloth 2 % PADS 6 each (6 each Topical Given 08/20/22 1610)  0.9 %  sodium chloride infusion (has no administration in time range)  norepinephrine (LEVOPHED) 4mg  in (0.016 mg/mL) premix infusion (has no administration in time range)  lactated ringers infusion (has no administration in time range)  clopidogrel (PLAVIX) tablet 600 mg (has no administration in time range)  clopidogrel (PLAVIX) tablet 75 mg (has no administration in time range)  sodium chloride 0.9 % bolus 1,000 mL (0 mLs Intravenous Stopped 08/20/22 1356)  pantoprazole (PROTONIX) injection 40 mg (40 mg  Intravenous Given 08/20/22 1432)    ED Course/ Medical Decision Making/ A&P Clinical Course as of 08/20/22 1734  Sat Aug 20, 2022  1332 Labs coming back with an elevated creatinine and elevated potassium low bicarb.  Lactic acid elevated at 2.3.  Also troponin coming back at 13,000.  I have placed a consult into cardiology.  Will likely need medicine admission with cards consult. [MB]  1347 Rectal exam done with nurse Bonita Quin as chaperone.  Normal tone no masses brown stool. [MB]  1357 Last cardiac echo 2/24 EF was 25 to 30% [MB]  1426 Discussed with [facility] cardiology Dr. Ancil Boozer.  She felt that the risk-benefit of anticoagulation was worth it for his NSTEMI.  His hemoglobin is stable.  She thought heparin without bolus would be reasonable and transferred to [facility] so they could further evaluate.  I have put in a call for critical care for admission. [MB]  1433 Discussed with critical care Dr. Merrily Pew.  He is recommending starting the patient on Levophed and is excepting the patient for ICU bed at [facility]. [MB]    Clinical Course User Index [MB] Terrilee Files, MD                                 Medical Decision Making Amount and/or Complexity of Data Reviewed Labs: ordered. Radiology: ordered.  Risk Prescription drug management. Decision regarding hospitalization.   This patient complains of 2 episodes of diarrhea, general weakness, episode of chest pain earlier; this involves an extensive number of treatment Options and is a complaint that carries with it a high risk of complications and morbidity. The differential includes ACS, dehydration, infection, colitis, diverticulitis  I ordered, reviewed and interpreted labs, which included CBC with elevated white count stable hemoglobin, chemistries with new AKI elevated potassium lactate elevated culture sent, fecal occult positive, stool studies ordered I ordered medication IV fluids IV PPI, IV heparin and reviewed PMP when indicated. I  ordered imaging studies which included chest x-ray and I independently    visualized and interpreted imaging which showed no acute infiltrate Previous records obtained and reviewed in epic including recent cardiology and GI notes I consulted cardiology Dr. Ancil Boozer and critical care Dr. Merrily Pew and discussed lab and imaging findings and discussed disposition.  Cardiac monitoring reviewed, paced rhythm Social determinants considered, given barriers Critical Interventions: Initiation of fluids for hypotension, heparin for NSTEMI, levophed for hypotension and consultation with critical care and cardiology  After the interventions stated above, I reevaluated the patient and found patient's blood pressure to be trending up and he looks fairly comfortable Admission and further testing considered, he would be transferred by [facility] to [facility] for further evaluation by cardiology and critical care.  He was started on IV Levophed for soft blood pressures.         Final Clinical Impression(s) / ED Diagnoses Final diagnoses:  NSTEMI (non-ST elevated myocardial infarction) (HCC)  AKI (acute kidney injury) (HCC)  Hypotension, unspecified hypotension type  Lactic acidosis    Rx / DC Orders ED Discharge Orders     None         Terrilee Files, MD 08/20/22 1739

## 2022-08-21 ENCOUNTER — Inpatient Hospital Stay (HOSPITAL_COMMUNITY): Payer: Medicare HMO

## 2022-08-21 DIAGNOSIS — I2510 Atherosclerotic heart disease of native coronary artery without angina pectoris: Secondary | ICD-10-CM | POA: Diagnosis not present

## 2022-08-21 DIAGNOSIS — I214 Non-ST elevation (NSTEMI) myocardial infarction: Secondary | ICD-10-CM | POA: Diagnosis not present

## 2022-08-21 DIAGNOSIS — R570 Cardiogenic shock: Secondary | ICD-10-CM

## 2022-08-21 DIAGNOSIS — I5030 Unspecified diastolic (congestive) heart failure: Secondary | ICD-10-CM

## 2022-08-21 DIAGNOSIS — I5023 Acute on chronic systolic (congestive) heart failure: Secondary | ICD-10-CM

## 2022-08-21 DIAGNOSIS — I959 Hypotension, unspecified: Secondary | ICD-10-CM | POA: Diagnosis not present

## 2022-08-21 DIAGNOSIS — N179 Acute kidney failure, unspecified: Secondary | ICD-10-CM | POA: Diagnosis not present

## 2022-08-21 DIAGNOSIS — I472 Ventricular tachycardia, unspecified: Secondary | ICD-10-CM | POA: Diagnosis not present

## 2022-08-21 DIAGNOSIS — D649 Anemia, unspecified: Secondary | ICD-10-CM | POA: Diagnosis not present

## 2022-08-21 DIAGNOSIS — N1831 Chronic kidney disease, stage 3a: Secondary | ICD-10-CM | POA: Diagnosis not present

## 2022-08-21 LAB — ECHOCARDIOGRAM COMPLETE
Area-P 1/2: 4.31 cm2
Calc EF: 8.1 %
Est EF: <20
Height: 72 in
S' Lateral: 5.6 cm
Single Plane A2C EF: 7.9 %
Single Plane A4C EF: 7.7 %
Weight: 3435.65 oz

## 2022-08-21 LAB — BASIC METABOLIC PANEL WITH GFR
Anion gap: 13 (ref 5–15)
Anion gap: 14 (ref 5–15)
BUN: 62 mg/dL — ABNORMAL HIGH (ref 8–23)
BUN: 62 mg/dL — ABNORMAL HIGH (ref 8–23)
CO2: 18 mmol/L — ABNORMAL LOW (ref 22–32)
CO2: 21 mmol/L — ABNORMAL LOW (ref 22–32)
Calcium: 8.3 mg/dL — ABNORMAL LOW (ref 8.9–10.3)
Calcium: 8.3 mg/dL — ABNORMAL LOW (ref 8.9–10.3)
Chloride: 99 mmol/L (ref 98–111)
Chloride: 99 mmol/L (ref 98–111)
Creatinine, Ser: 3.55 mg/dL — ABNORMAL HIGH (ref 0.61–1.24)
Creatinine, Ser: 3.63 mg/dL — ABNORMAL HIGH (ref 0.61–1.24)
GFR, Estimated: 16 mL/min — ABNORMAL LOW (ref >60)
GFR, Estimated: 16 mL/min — ABNORMAL LOW (ref >60)
Glucose, Bld: 147 mg/dL — ABNORMAL HIGH (ref 70–99)
Glucose, Bld: 156 mg/dL — ABNORMAL HIGH (ref 70–99)
Potassium: 5.3 mmol/L — ABNORMAL HIGH (ref 3.5–5.1)
Potassium: 5.3 mmol/L — ABNORMAL HIGH (ref 3.5–5.1)
Sodium: 131 mmol/L — ABNORMAL LOW (ref 135–145)
Sodium: 133 mmol/L — ABNORMAL LOW (ref 135–145)

## 2022-08-21 LAB — COOXEMETRY PANEL
Carboxyhemoglobin: 0.7 % (ref 0.5–1.5)
Carboxyhemoglobin: 1.1 % (ref 0.5–1.5)
Carboxyhemoglobin: 1.2 % (ref 0.5–1.5)
Carboxyhemoglobin: 1.3 % (ref 0.5–1.5)
Methemoglobin: 1.3 % (ref 0.0–1.5)
Methemoglobin: <0.7 % (ref 0.0–1.5)
Methemoglobin: <0.7 % (ref 0.0–1.5)
Methemoglobin: <0.7 % (ref 0.0–1.5)
O2 Saturation: 31.2 %
O2 Saturation: 43 %
O2 Saturation: 55.3 %
O2 Saturation: 57.9 %
Total hemoglobin: 12.1 g/dL (ref 12.0–16.0)
Total hemoglobin: 12.7 g/dL (ref 12.0–16.0)
Total hemoglobin: 12.9 g/dL (ref 12.0–16.0)
Total hemoglobin: 13.3 g/dL (ref 12.0–16.0)

## 2022-08-21 LAB — URINALYSIS, ROUTINE W REFLEX MICROSCOPIC
Bilirubin Urine: NEGATIVE
Glucose, UA: NEGATIVE mg/dL
Hgb urine dipstick: NEGATIVE
Ketones, ur: NEGATIVE mg/dL
Leukocytes,Ua: NEGATIVE
Nitrite: NEGATIVE
Protein, ur: NEGATIVE mg/dL
Specific Gravity, Urine: 1.006 (ref 1.005–1.030)
pH: 5 (ref 5.0–8.0)

## 2022-08-21 LAB — TROPONIN I (HIGH SENSITIVITY): Troponin I (High Sensitivity): 8096 ng/L (ref <18)

## 2022-08-21 LAB — BASIC METABOLIC PANEL
Anion gap: 13 (ref 5–15)
BUN: 65 mg/dL — ABNORMAL HIGH (ref 8–23)
CO2: 22 mmol/L (ref 22–32)
Calcium: 8.2 mg/dL — ABNORMAL LOW (ref 8.9–10.3)
Chloride: 97 mmol/L — ABNORMAL LOW (ref 98–111)
Creatinine, Ser: 3.35 mg/dL — ABNORMAL HIGH (ref 0.61–1.24)
GFR, Estimated: 18 mL/min — ABNORMAL LOW (ref >60)
Glucose, Bld: 158 mg/dL — ABNORMAL HIGH (ref 70–99)
Potassium: 4.7 mmol/L (ref 3.5–5.1)
Sodium: 132 mmol/L — ABNORMAL LOW (ref 135–145)

## 2022-08-21 LAB — BRAIN NATRIURETIC PEPTIDE: B Natriuretic Peptide: 2520.5 pg/mL — ABNORMAL HIGH (ref 0.0–100.0)

## 2022-08-21 LAB — CBC
HCT: 39.6 % (ref 39.0–52.0)
Hemoglobin: 13.2 g/dL (ref 13.0–17.0)
MCH: 31.7 pg (ref 26.0–34.0)
MCHC: 33.3 g/dL (ref 30.0–36.0)
MCV: 95.2 fL (ref 80.0–100.0)
Platelets: 134 10*3/uL — ABNORMAL LOW (ref 150–400)
RBC: 4.16 MIL/uL — ABNORMAL LOW (ref 4.22–5.81)
RDW: 14.5 % (ref 11.5–15.5)
WBC: 15.3 10*3/uL — ABNORMAL HIGH (ref 4.0–10.5)
nRBC: 0 % (ref 0.0–0.2)

## 2022-08-21 LAB — HEPARIN LEVEL (UNFRACTIONATED)
Heparin Unfractionated: 0.3 [IU]/mL (ref 0.30–0.70)
Heparin Unfractionated: 0.36 IU/mL (ref 0.30–0.70)

## 2022-08-21 LAB — MAGNESIUM: Magnesium: 2.3 mg/dL (ref 1.7–2.4)

## 2022-08-21 LAB — LACTIC ACID, PLASMA
Lactic Acid, Venous: 1.4 mmol/L (ref 0.5–1.9)
Lactic Acid, Venous: 1.5 mmol/L (ref 0.5–1.9)
Lactic Acid, Venous: 1.7 mmol/L (ref 0.5–1.9)
Lactic Acid, Venous: 2.2 mmol/L (ref 0.5–1.9)

## 2022-08-21 LAB — GLUCOSE, CAPILLARY: Glucose-Capillary: 159 mg/dL — ABNORMAL HIGH (ref 70–99)

## 2022-08-21 LAB — CREATININE, URINE, RANDOM: Creatinine, Urine: 29 mg/dL

## 2022-08-21 MED ORDER — FUROSEMIDE 10 MG/ML IJ SOLN
160.0000 mg | Freq: Four times a day (QID) | INTRAVENOUS | Status: DC
  Administered 2022-08-21 – 2022-08-22 (×3): 160 mg via INTRAVENOUS
  Filled 2022-08-21: qty 10
  Filled 2022-08-21 (×3): qty 16
  Filled 2022-08-21 (×2): qty 10

## 2022-08-21 MED ORDER — FUROSEMIDE 10 MG/ML IJ SOLN
120.0000 mg | Freq: Once | INTRAVENOUS | Status: DC
  Filled 2022-08-21: qty 12

## 2022-08-21 MED ORDER — PERFLUTREN LIPID MICROSPHERE
1.0000 mL | INTRAVENOUS | Status: AC | PRN
  Administered 2022-08-21: 2 mL via INTRAVENOUS

## 2022-08-21 MED ORDER — METOLAZONE 5 MG PO TABS
5.0000 mg | ORAL_TABLET | Freq: Once | ORAL | Status: AC
  Administered 2022-08-21: 5 mg via ORAL
  Filled 2022-08-21: qty 1

## 2022-08-21 MED ORDER — NOREPINEPHRINE 4 MG/250ML-% IV SOLN
0.0000 ug/min | INTRAVENOUS | Status: DC
  Administered 2022-08-21: 8 ug/min via INTRAVENOUS
  Filled 2022-08-21: qty 250

## 2022-08-21 MED ORDER — FUROSEMIDE 10 MG/ML IJ SOLN
120.0000 mg | Freq: Once | INTRAVENOUS | Status: AC
  Administered 2022-08-21: 120 mg via INTRAVENOUS
  Filled 2022-08-21: qty 10

## 2022-08-21 MED ORDER — MILRINONE LACTATE IN DEXTROSE 20-5 MG/100ML-% IV SOLN
0.1250 ug/kg/min | INTRAVENOUS | Status: DC
  Administered 2022-08-21 – 2022-08-24 (×7): 0.25 ug/kg/min via INTRAVENOUS
  Administered 2022-08-25: 0.125 ug/kg/min via INTRAVENOUS
  Filled 2022-08-21 (×7): qty 100

## 2022-08-21 MED ORDER — METOLAZONE 5 MG PO TABS
5.0000 mg | ORAL_TABLET | Freq: Two times a day (BID) | ORAL | Status: DC
  Administered 2022-08-21 – 2022-08-22 (×2): 5 mg via ORAL
  Filled 2022-08-21 (×2): qty 1

## 2022-08-21 MED ORDER — FUROSEMIDE 10 MG/ML IJ SOLN
80.0000 mg | Freq: Once | INTRAMUSCULAR | Status: AC
  Administered 2022-08-21: 80 mg via INTRAVENOUS
  Filled 2022-08-21: qty 8

## 2022-08-21 NOTE — Consult Note (Signed)
Advanced Heart Failure Team Consult Note   Primary Physician: Carylon Perches, MD PCP-Cardiologist:  Nona Dell, MD  Reason for Consultation: Cardiogenic shock  HPI:    Grant Stevens is seen today for evaluation of Cardiogenic shock at the request of Dr. Izora Ribas.   Mr. Zinger is a 82 y.o. male with a history CAD (s/p CABG in 2002,  HFrEF/ICM (EF 25-30% in 2/24), SSS (s/p St. Jude Dual PPM in 05/2020).   At baseline is independent and active. Says he woke from sleep with CP several days ago and couldn't get comfortable for hours. Was in/out of chair. Did not go to ER. Over past few days had diarrhea, which he says is chronic for him. Went to [facility] and mentioned his CP. Hstrop was 13K In the ED had hypotension, a lactate of 2.4, Scr 3.8 (up from 1.1) and needed levophed. Transferred to [facility]  Overnight central access placed and initial co-ox 31% on NE 4. Repeat co-ox 43%. Overnight also had VT and lido/amio added. Milrinone add this am. Co-ox up to 58%  Lactic acid 2.3 -> 1.7 -> 1.5  Currently feels fine. Denies CP or SOB.  Has received 120 IV lasix with no output    Review of Systems: [y] = yes, [ ]  = no   General: Weight gain [ ] ; Weight loss [ ] ; Anorexia [ ] ; Fatigue [ ] ; Fever [ ] ; Chills [ ] ; Weakness [ ]   Cardiac: Chest pain/pressure [ y]; Resting SOB [ ] ; Exertional SOB [ ] ; Orthopnea [ ] ; Pedal Edema [ ] ; Palpitations [ ] ; Syncope [ ] ; Presyncope [ ] ; Paroxysmal nocturnal dyspnea[ ]   Pulmonary: Cough [ ] ; Wheezing[ ] ; Hemoptysis[ ] ; Sputum [ ] ; Snoring [ ]   GI: Vomiting[ ] ; Dysphagia[ ] ; Melena[ ] ; Hematochezia [ ] ; Heartburn[ ] ; Abdominal pain [ ] ; Constipation [ ] ; Diarrhea [place] ]; BRBPR [ ]   GU: Hematuria[ ] ; Dysuria [ ] ; Nocturia[ ]   Vascular: Pain in legs with walking [ ] ; Pain in feet with lying flat [ ] ; Non-healing sores [ ] ; Stroke [ ] ; TIA [ ] ; Slurred speech [ ] ;  Neuro: Headaches[ ] ; Vertigo[ ] ; Seizures[ ] ; Paresthesias[ ] ;Blurred vision [ ] ; Diplopia [ ] ;  Vision changes [ ]   Ortho/Skin: Arthritis [ y]; Joint pain [ y]; Muscle pain [ ] ; Joint swelling [ ] ; Back Pain [ ] ; Rash [ ]   Psych: Depression[ ] ; Anxiety[ ]   Heme: Bleeding problems [ ] ; Clotting disorders [ ] ; Anemia [ ]   Endocrine: Diabetes [ ] ; Thyroid dysfunction[ ]   Home Medications Prior to Admission medications   Medication Sig Start Date End Date Taking? Authorizing Provider  aspirin EC 81 MG tablet Take 1 tablet (81 mg total) by mouth daily. 03/31/18  Yes Rehman, Joline Maxcy, MD  atorvastatin (LIPITOR) 80 MG tablet Take 1 tablet (80 mg total) by mouth at bedtime. 01/19/22  Yes Jonelle Sidle, MD  B Complex-C (SUPER B COMPLEX PO) Take 1 capsule by mouth daily.   Yes [provider]  bisoprolol (ZEBETA) 5 MG tablet TAKE 1/2 TABLET(2.5 MG) BY MOUTH DAILY 03/28/22  Yes Jonelle Sidle, MD  carbamazepine (TEGRETOL) 200 MG tablet Take 400 mg by mouth 2 (two) times daily.    Yes [provider]  divalproex (DEPAKOTE) 250 MG DR tablet Take 250 mg by mouth 2 (two) times daily. 02/08/22  Yes [provider]  empagliflozin (JARDIANCE) 10 MG TABS tablet Take 1 tablet (10 mg total) by mouth daily before breakfast.  07/27/22  Yes Jonelle Sidle, MD  ENTRESTO 24-26 MG TAKE 1 TABLET BY MOUTH TWICE DAILY 04/25/22  Yes Jonelle Sidle, MD  EPINEPHrine 0.3 mg/0.3 mL IJ SOAJ injection Inject 0.3 mg into the muscle as needed for anaphylaxis. 09/13/21  Yes Pollina, Canary Brim, MD  famotidine (PEPCID) 40 MG tablet TAKE (1) TABLET BY MOUTH AT BEDTIME. 03/08/22  Yes Carlan, Chelsea L, NP  furosemide (LASIX) 40 MG tablet Take 0.5-1 tablets (20-40 mg total) by mouth daily. 20mg  one day then 40mg  the other day alternating 20mg -40mg  01/28/22  Yes Jonelle Sidle, MD  LORazepam (ATIVAN) 0.5 MG tablet Take 0.5 mg by mouth 2 (two) times daily. 08/04/22  Yes [provider]  pantoprazole (PROTONIX) 40 MG tablet Take 1 tablet (40 mg total) by mouth daily before breakfast.  12/16/21  Yes Carlan, Chelsea L, NP  silodosin (RAPAFLO) 8 MG CAPS capsule Take 1 capsule (8 mg total) by mouth daily with breakfast. 01/28/22  Yes McKenzie, Mardene Celeste, MD  temazepam (RESTORIL) 15 MG capsule Take 15 mg by mouth at bedtime as needed for sleep. 03/01/22  Yes [provider]    Past Medical History: Past Medical History:  Diagnosis Date   Allergic rhinitis    Arthritis    Asthma    Childhood   Atrial fibrillation (HCC)    Remote history - WARCEF   Bladder cancer (HCC)    BPH (benign prostatic hyperplasia)    Cardiomyopathy (HCC)    CHF (congestive heart failure) (HCC)    a. EF 30-35% by echo in 2018 and 10/2018, at 25-30% in 05/2020   Coronary atherosclerosis of native coronary artery    Multivessel s/p CABG in 2002 post-IMI, LVEF 35% up to 50% postoperatively;   Depression    Difficulty sleeping    Erectile dysfunction    Essential hypertension    Frequency of urination    History of bipolar disorder    Hyperlipidemia    IBS (irritable bowel syndrome)    Myocardial infarction (HCC) 2002    Past Surgical History: Past Surgical History:  Procedure Laterality Date   BIOPSY  03/28/2018   Procedure: BIOPSY;  Surgeon: Malissa Hippo, MD;  [facility]: [facility];  Service: Endoscopy;;  esophagus   BIOPSY  03/09/2021   Procedure: BIOPSY;  Surgeon: Dolores Frame, MD;  [facility]: [facility];  Service: Gastroenterology;;   BIV PACEMAKER INSERTION CRT-P N/A 05/12/2020   Procedure: BIV PACEMAKER INSERTION CRT-P;  Surgeon: Hillis Range, MD;  Location: [facility] INVAS[facility] CV LAB;  Service: Cardiovascular;  Laterality: N/A;   CATARACT EXTRACTION W/PHACO Left 12/29/2014   Procedure: CATARACT EXTRACTION PHACO AND INTRAOCULAR LENS PLACEMENT (IOC);  Surgeon: Susa Simmonds, MD;  Location: [facility];  Service: Ophthalmology;  Laterality: Left;  CDE:3.71   CATARACT EXTRACTION W/PHACO Right 03/30/2015   Procedure: CATARACT EXTRACTION PHACO AND INTRAOCULAR LENS  PLACEMENT RIGHT EYE CDE=2.56;  Surgeon: Susa Simmonds, MD;  Location: [facility];  Service: Ophthalmology;  Laterality: Right;   CIRCUMCISION  10/11/2003   COLONOSCOPY N/A 07/11/2012   rehman,transverse colon polyp (benign lymphoid); tortuous colon; prep adequate with much suction/lavage; hemorrhoids.   CORONARY ARTERY BYPASS GRAFT  08/10/2000   Dr. Tyrone Sage - LIMA to LAD, SVG to diagonal, SVG to PDA TRIPLE BYPASS   CYSTOSCOPY W/ RETROGRADES Bilateral 03/02/2015   Procedure: CYSTOSCOPY WITH RIGHT RETROGRADE PYELOGRAM,  ATTEMPTED LEFT RETROGRADE PYELOGRAM;  Surgeon: Malen Gauze, MD;  Location: [facility];  Service: Urology;  Laterality: Bilateral;  ESOPHAGEAL DILATION N/A 03/28/2018   Procedure: ESOPHAGEAL DILATION;  Surgeon: Malissa Hippo, MD;  [facility]: [facility];  Service: Endoscopy;  Laterality: N/A;   ESOPHAGEAL DILATION N/A 03/15/2022   Procedure: ESOPHAGEAL DILATION;  Surgeon: Dolores Frame, MD;  [facility];  Service: Gastroenterology;  Laterality: N/A;   ESOPHAGOGASTRODUODENOSCOPY N/A 03/28/2018   rehman,Abnormal esophageal motility. mild schatzki ring at GEJ, dilated, patch of salmon colored mucosa at distal esophagus. Barrett's esophagus. 2cm HH. erosive gastropathy, normal pylorus, duodenal erosions w/o bleeding. normal second portion of duodenum   ESOPHAGOGASTRODUODENOSCOPY (EGD) WITH PROPOFOL N/A 03/09/2021   Procedure: ESOPHAGOGASTRODUODENOSCOPY (EGD) WITH PROPOFOL;  Surgeon: Dolores Frame, MD;  [facility];  Service: Gastroenterology;  Laterality: N/A;  945   ESOPHAGOGASTRODUODENOSCOPY (EGD) WITH PROPOFOL N/A 03/15/2022   Procedure: ESOPHAGOGASTRODUODENOSCOPY (EGD) WITH PROPOFOL;  Surgeon: Dolores Frame, MD;  [facility];  Service: Gastroenterology;  Laterality: N/A;  1045am, asa 3   TEMPORARY PACEMAKER N/A 05/12/2020   Procedure: TEMPORARY PACEMAKER;  Surgeon: Swaziland, Peter M, MD;  Location: [facility] INVASIVE CV  LAB;  Service: Cardiovascular;  Laterality: N/A;   TONSILLECTOMY     TRANSURETHRAL RESECTION OF BLADDER TUMOR N/A 01/26/2015   Procedure: TRANSURETHRAL RESECTION OF BLADDER TUMOR (TURBT);  Surgeon: Malen Gauze, MD;  Location: [facility];  Service: Urology;  Laterality: N/A;   TRANSURETHRAL RESECTION OF BLADDER TUMOR WITH GYRUS (TURBT-GYRUS) N/A 03/02/2015   Procedure: TRANSURETHRAL RESECTION OF BLADDER TUMOR WITH GYRUS (TURBT-GYRUS);  Surgeon: Malen Gauze, MD;  Location: [facility];  Service: Urology;  Laterality: N/A;   TRANSURETHRAL RESECTION OF PROSTATE N/A 01/26/2015   Procedure: TRANSURETHRAL RESECTION OF THE PROSTATE WITH GYRUS INSTRUMENTS;  Surgeon: Malen Gauze, MD;  Location: [facility];  Service: Urology;  Laterality: N/A;    Family History: Family History  Problem Relation Age of Onset   Asthma Mother    Heart attack Father 62   Diabetes Brother     Social History: Social History   Socioeconomic History   Marital status: Widowed    Spouse name: Not on file   Number of children: Not on file   Years of education: Not on file   Highest education level: Not on file  Occupational History   Not on file  Tobacco Use   Smoking status: Former    Current packs/day: 0.00    Average packs/day: 1 pack/day for 15.0 years (15.0 ttl pk-yrs)    Types: Cigarettes    Start date: 01/11/1956    Quit date: 01/11/1971    Years since quitting: 51.6    Passive exposure: Past   Smokeless tobacco: Never  Vaping Use   Vaping status: Never Used  Substance and Sexual Activity   Alcohol use: Not Currently    Comment: occ.   Drug use: No   Sexual activity: Not Currently  Other Topics Concern   Not on file  Social History Narrative   Married with no children    Exercises 4 times weekly   Caffeine once a day   Social Determinants of Health   Financial Resource Strain: Not on file  Food Insecurity: Not on file  Transportation Needs: Not on file  Physical Activity: Not on file   Stress: Not on file  Social Connections: Not on file    Allergies:  Allergies  Allergen Reactions   Spironolactone Other (See Comments)    Hyperkalemia    Objective:    Vital Signs:   Temp:  [97.9  F (36.6 C)-98.7 F (37.1 C)] 98 F (36.7 C) (08/11 0300) Pulse Rate:  [60-78] 60 (08/11 1000) Resp:  [0-60] 18 (08/11 1000) BP: (63-126)/(42-83) 94/75 (08/11 0900) SpO2:  [82 %-100 %] 98 % (08/11 1000) Weight:  [97.4 kg-98 kg] 97.4 kg (08/11 0600) Last BM Date : 08/20/22  Weight change: Filed Weights   08/20/22 1135 08/21/22 0600  Weight: 98 kg 97.4 kg    Intake/Output:   Intake/Output Summary (Last 24 hours) at 08/21/2022 1024 Last data filed at 08/21/2022 0700 Gross per 24 hour  Intake 1882.14 ml  Output --  Net 1882.14 ml      Physical Exam    General:  Elderly. Mildly SOB with talking HEENT: normal Neck: supple. LIJ TLC .  JVP to jaw Carotids 2+ bilat; no bruits. No lymphadenopathy or thyromegaly appreciated. Cor: PMI nondisplaced. Regular rate & rhythm. No rubs, gallops or murmurs. Lungs: clear Abdomen: soft, nontender, nondistended. No hepatosplenomegaly. No bruits or masses. Good bowel sounds. Extremities: no cyanosis, clubbing, rash, 2-3+ edema Neuro: alert & orientedx3, cranial nerves grossly intact. moves all 4 extremities w/o difficulty. Affect pleasant   Telemetry   Vpacing 60-70s with runs of VT overnight. No VT this am. + PVCs Personally reviewed  EKG    Vpaced at 71 Personally reviewed  Labs   Basic Metabolic Panel: Recent Labs  Lab 08/20/22 1224 08/21/22 0009 08/21/22 0441  NA 134* 133* 131*  K 5.2* 5.3* 5.3*  CL 99 99 99  CO2 20* 21* 18*  GLUCOSE 105* 147* 156*  BUN 56* 62* 62*  CREATININE 3.78* 3.63* 3.55*  CALCIUM 8.6* 8.3* 8.3*  MG  --  2.3  --     Liver Function Tests: Recent Labs  Lab 08/20/22 1224  AST 66*  ALT 40  ALKPHOS 66  BILITOT 0.7  PROT 7.2  ALBUMIN 4.2   Recent Labs  Lab 08/20/22 1224  LIPASE 38    No results for input(s): "AMMONIA" in the last 168 hours.  CBC: Recent Labs  Lab 08/20/22 1224 08/21/22 0441  WBC 11.8* 15.3*  NEUTROABS 10.4*  --   HGB 12.3* 13.2  HCT 38.1* 39.6  MCV 96.2 95.2  PLT 114* 134*    Cardiac Enzymes: No results for input(s): "CKTOTAL", "CKMB", "CKMBINDEX", "TROPONINI" in the last 168 hours.  BNP: BNP (last 3 results) Recent Labs    08/21/22 0441  BNP 2,520.5*    ProBNP (last 3 results) No results for input(s): "PROBNP" in the last 8760 hours.   CBG: Recent Labs  Lab 08/21/22 0334  GLUCAP 159*    Coagulation Studies: No results for input(s): "LABPROT", "INR" in the last 72 hours.   Imaging   ECHOCARDIOGRAM COMPLETE  Result Date: 08/21/2022    ECHOCARDIOGRAM REPORT   Patient Name:   ELRICO GERRICK Date of Exam: 08/21/2022 Medical Rec #:  [id number]        Height:       72.0 in Accession #:    [id number]       Weight:       214.7 lb Date of Birth:  May 16, 1940         BSA:          2.196 m Patient Age:    82 years         BP:           90/71 mmHg Patient Gender: M  HR:           60 bpm. Exam Location:  Inpatient Procedure: 2D Echo, Cardiac Doppler, Color Doppler and Intracardiac            Opacification Agent STAT ECHO REPORT CONTAINS CRITICAL RESULT Indications:    I50.40* Unspecified combined systolic (congestive) and diastolic                 (congestive) heart failure  History:        Patient has prior history of Echocardiogram examinations, most                 recent 03/03/2022. CHF, CAD, Previous Myocardial Infarction and                 Acute MI, Abnormal ECG, Prior CABG and Pacemaker,                 Arrythmias:Heart block and Atrial Fibrillation,                 Signs/Symptoms:Syncope, Dizziness/Lightheadedness and                 Hypotension; Risk Factors:Former Smoker and Sleep Apnea.  Sonographer:    Sheralyn Boatman RDCS Referring Phys: [id number] [facility] A CHANDRASEKHAR IMPRESSIONS  1. Left ventricular ejection fraction, by  estimation, is <20%. The left ventricle has severely decreased function. The left ventricle demonstrates regional wall motion abnormalities (see scoring diagram/findings for description). The left ventricular internal cavity size was mildly to moderately dilated. Left ventricular diastolic parameters are consistent with Grade III diastolic dysfunction (restrictive).  2. Right ventricular systolic function is severely reduced. The right ventricular size is mildly enlarged. There is mildly elevated pulmonary artery systolic pressure. The estimated right ventricular systolic pressure is 38.9 mmHg.  3. The mitral valve is degenerative. Trivial mitral valve regurgitation. No evidence of mitral stenosis.  4. The aortic valve is tricuspid. There is moderate calcification of the aortic valve. Aortic valve regurgitation is not visualized. Aortic valve sclerosis is present, with no evidence of aortic valve stenosis.  5. The inferior vena cava is dilated in size with >50% respiratory variability, suggesting right atrial pressure of 8 mmHg. Comparison(s): Prior images reviewed side by side. LVEF is further decreased. No LV thrombus noted. FINDINGS  Left Ventricle: Left ventricular ejection fraction, by estimation, is <20%. The left ventricle has severely decreased function. The left ventricle demonstrates regional wall motion abnormalities. Definity contrast agent was given IV to delineate the left ventricular endocardial borders. The left ventricular internal cavity size was mildly to moderately dilated. There is no left ventricular hypertrophy. Left ventricular diastolic parameters are consistent with Grade III diastolic dysfunction (restrictive).  LV Wall Scoring: The apical lateral segment, apical septal segment, apical anterior segment, and apical inferior segment are aneurysmal. The mid anteroseptal segment, mid inferolateral segment, mid anterolateral segment, mid inferoseptal segment, mid anterior segment, and mid  inferior segment are akinetic. The basal anteroseptal segment, basal inferolateral segment, basal anterolateral segment, basal anterior segment, basal inferior segment, and basal inferoseptal segment are hypokinetic. Right Ventricle: The right ventricular size is mildly enlarged. No increase in right ventricular wall thickness. Right ventricular systolic function is severely reduced. There is mildly elevated pulmonary artery systolic pressure. The tricuspid regurgitant velocity is 2.78 m/s, and with an assumed right atrial pressure of 8 mmHg, the estimated right ventricular systolic pressure is 38.9 mmHg. Left Atrium: Left atrial size was normal in size. Right Atrium: Right atrial size was normal in size. Pericardium:  There is no evidence of pericardial effusion. Mitral Valve: The mitral valve is degenerative in appearance. Trivial mitral valve regurgitation. No evidence of mitral valve stenosis. Tricuspid Valve: The tricuspid valve is grossly normal. Tricuspid valve regurgitation is not demonstrated. No evidence of tricuspid stenosis. Aortic Valve: The aortic valve is tricuspid. There is moderate calcification of the aortic valve. Aortic valve regurgitation is not visualized. Aortic valve sclerosis is present, with no evidence of aortic valve stenosis. Pulmonic Valve: The pulmonic valve was normal in structure. Pulmonic valve regurgitation is not visualized. No evidence of pulmonic stenosis. Aorta: The aortic root and ascending aorta are structurally normal, with no evidence of dilitation. Venous: The inferior vena cava is dilated in size with greater than 50% respiratory variability, suggesting right atrial pressure of 8 mmHg. IAS/Shunts: No atrial level shunt detected by color flow Doppler.  LEFT VENTRICLE PLAX 2D LVIDd:         5.90 cm      Diastology LVIDs:         5.60 cm      LV e' medial:    3.16 cm/s LV PW:         1.80 cm      LV E/e' medial:  18.7 LV IVS:        1.20 cm      LV e' lateral:   8.11 cm/s  LVOT diam:     2.40 cm      LV E/e' lateral: 7.3 LV SV:         43 LV SV Index:   19 LVOT Area:     4.52 cm  LV Volumes (MOD) LV vol d, MOD A2C: 165.0 ml LV vol d, MOD A4C: 207.0 ml LV vol s, MOD A2C: 152.0 ml LV vol s, MOD A4C: 191.0 ml LV SV MOD A2C:     13.0 ml LV SV MOD A4C:     207.0 ml LV SV MOD BP:      15.0 ml RIGHT VENTRICLE            IVC RV S prime:     2.61 cm/s  IVC diam: 3.30 cm TAPSE (M-mode): 0.6 cm LEFT ATRIUM             Index        RIGHT ATRIUM           Index LA diam:        4.90 cm 2.23 cm/m   RA Area:     20.20 cm LA Vol (A2C):   44.7 ml 20.36 ml/m  RA Volume:   64.90 ml  29.56 ml/m LA Vol (A4C):   68.8 ml 31.33 ml/m LA Biplane Vol: 57.7 ml 26.28 ml/m  AORTIC VALVE LVOT Vmax:   70.30 cm/s LVOT Vmean:  44.800 cm/s LVOT VTI:    0.094 m  AORTA Ao Root diam: 3.60 cm Ao Asc diam:  3.80 cm MITRAL VALVE               TRICUSPID VALVE MV Area (PHT): 4.31 cm    TR Peak grad:   30.9 mmHg MV Decel Time: 176 msec    TR Vmax:        278.00 cm/s MV E velocity: 59.20 cm/s MV A velocity: 44.50 cm/s  SHUNTS MV E/A ratio:  1.33        Systemic VTI:  0.09 m  Systemic Diam: 2.40 cm Riley Lam MD Electronically signed by Riley Lam MD Signature Date/Time: 08/21/2022/8:54:00 AM    Final    DG CHEST PORT 1 VIEW  Result Date: 08/20/2022 CLINICAL DATA:  Check central line placement EXAM: PORTABLE CHEST 1 VIEW COMPARISON:  Film from earlier in the same day. FINDINGS: Left jugular central line is noted with the catheter tip at the cavoatrial junction. No pneumothorax is noted. Cardiac shadow is stable. Pacing device and postsurgical changes are again seen. The lungs are clear bilaterally. IMPRESSION: No pneumothorax following central line placement. No acute abnormality noted. Electronically Signed   By: Alcide Clever M.D.   On: 08/20/2022 20:16   US RENAL  Result Date: 08/20/2022 CLINICAL DATA:  Acute renal injury EXAM: RENAL / URINARY TRACT ULTRASOUND COMPLETE  COMPARISON:  None Available. FINDINGS: Right Kidney: Renal measurements: 11.7 x 4.5 x 4.6 cm. = volume: 125 mL. Echogenicity within normal limits. No mass or hydronephrosis visualized. Left Kidney: Renal measurements: 12.1 x 5.2 x 4.5 cm. = volume: 148 mL. Echogenicity within normal limits. No mass or hydronephrosis visualized. Bladder: Decompressed Other: None. IMPRESSION: Unremarkable kidneys.  No obstructive changes are seen. Electronically Signed   By: Alcide Clever M.D.   On: 08/20/2022 19:59   DG Chest Port 1 View  Result Date: 08/20/2022 CLINICAL DATA:  82 year old male with history of chest pain. Diarrhea for the past 3 days. EXAM: PORTABLE CHEST 1 VIEW COMPARISON:  Chest x-ray 07/15/2021. FINDINGS: Lung volumes are slightly low. No consolidative airspace disease. No pleural effusions. No pneumothorax. No pulmonary nodule or mass noted. Pulmonary vasculature and the cardiomediastinal silhouette are within normal limits. Atherosclerotic calcifications in the thoracic aorta. Left-sided pacemaker device in place with lead tips projecting over the expected location of the right atrium and right ventricle. Status post median sternotomy for CABG. IMPRESSION: 1. No radiographic evidence of acute cardiopulmonary disease. 2. Aortic atherosclerosis. Electronically Signed   By: Trudie Reed M.D.   On: 08/20/2022 12:22     Medications:     Current Medications:  aspirin EC  81 mg Oral Daily   atorvastatin  80 mg Oral QHS   carbamazepine  400 mg Oral BID   Chlorhexidine Gluconate Cloth  6 each Topical Daily   clopidogrel  75 mg Oral Daily   divalproex  250 mg Oral BID   pantoprazole  40 mg Oral QAC breakfast    Infusions:  sodium chloride Stopped (08/20/22 1740)   sodium chloride     amiodarone 30 mg/hr (08/21/22 0700)   furosemide 120 mg (08/21/22 1007)   furosemide     heparin 1,200 Units/hr (08/21/22 0700)   lidocaine 1 mg/min (08/21/22 0700)   milrinone 0.25 mcg/kg/min (08/21/22 0751)    norepinephrine (LEVOPHED) Adult infusion 4 mcg/min (08/21/22 0700)     Assessment/Plan   1. Acute on chronic systolic HF -> cardiogenic shock - In setting of OOH infarct - Echo 2/24 EF 25-30% RV mildly reduced - Echo 08/21/22: EF < 20% RV severely reduced - Initial co-ox 31% - Now on NE 4 and milrinone 0.25 -> co-ox 58% - Off GDMT due to shock, AKI - Volume up. Not diuresing with IV lasix. Increase lasix to 160 IV q6 with metolazone 5 bid. I asked Renal to see inc ase he will need CVVHD - Not candidate for advanced therapies with age - Eventually will need coronary angio if/when SCr improves. Can also consider cMRI - check viral panel and COVID  2. CAD with possible  NSTEMI - h/o CAD s/p CABG  - suspect OOH infarct with loss of SVG - will treat empirically for ACS  - heparin x 48 hours - DAPT/statin  - Eventually will need coronary angio if/when SCr improves.   3. Ventricular tachycardia - continue amio  - wean lidocaine - Keep K > 4.0 Mg > 2.0  4. AKI - due to shock/ATN - baseline Scr 1.1 -> 3.8  - continue hemodynamic support - - Volume up. Not diuresing with IV lasix. Increase lasix to 160 IV q6 with metolazone 5 bid. I asked Renal to see inc ase he will need CVVHD  5. SSS - s/p pacer  6. Limited DNR - I spoke with him in detail about GOC wishes - Ok with intubation and defibrillation  - Would not want CPR  CRITICAL CARE Performed by: Arvilla Meres  Total critical care time: 45 minutes  Critical care time was exclusive of separately billable procedures and treating other patients.  Critical care was necessary to treat or prevent imminent or life-threatening deterioration.  Critical care was time spent personally by me (independent of midlevel providers or residents) on the following activities: development of treatment plan with patient and/or surrogate as well as nursing, discussions with consultants, evaluation of patient's response to treatment,  examination of patient, obtaining history from patient or surrogate, ordering and performing treatments and interventions, ordering and review of laboratory studies, ordering and review of radiographic studies, pulse oximetry and re-evaluation of patient's condition.   Length of Stay: 1  Arvilla Meres, MD  08/21/2022, 10:24 AM  Advanced Heart Failure Team Pager [phone] (M-F; 7a - 5p)  Please contact [facility] for night-coverage after hours (4p -7a ) and weekends on [email or web address]

## 2022-08-21 NOTE — Consult Note (Signed)
Renal Service Consult Note [facility]  WOLFE RENTZ 08/21/2022 Maree Krabbe, MD Requesting Physician: Dr. Merrily Pew  Reason for Consult: Renal failure HPI: The patient is a 82 y.o. year-old w/ PMH as below who presented yesterday 8/10 to the ED c/o diarrhea for 3 days. No n/v. Also had chest pain for 2-3 days. Hx of CAD sp CABG, ischemic CM w/ EF 25%, sp PPM, atrial fib, bladder cancer, bipolar d/o. In ED pt was in shock w/ high LA and hypotension, mild ^K+ and AKI.  Pt was started on IV heparin and levophed gtt and admitted to ICU. Creat was 3.7 on admission yesterday and today is down to 3.5.    Pt seen in ICU. Is on levo gtt 1- 8 mcg /min last night and today. Also IV amiodarone, and IV milrinone. Got 80mg  IV lasix yesterday and 120mg  today. Currently is ordered lasix IV 160mg  qid. Also on IV heparin.  NO IV contrast given while here.   Pt seen in room. Alert and responsive. Just voided 600 cc amber clear urine in a jug.  No c/o's at this time. CP has resolved. No hx renal failure.   ROS - denies CP, no joint pain, no HA, no blurry vision, no rash, no diarrhea, no nausea/ vomiting, no dysuria, no difficulty voiding   Past Medical History  Past Medical History:  Diagnosis Date   Allergic rhinitis    Arthritis    Asthma    Childhood   Atrial fibrillation (HCC)    Remote history - WARCEF   Bladder cancer (HCC)    BPH (benign prostatic hyperplasia)    Cardiomyopathy (HCC)    CHF (congestive heart failure) (HCC)    a. EF 30-35% by echo in 2018 and 10/2018, at 25-30% in 05/2020   Coronary atherosclerosis of native coronary artery    Multivessel s/p CABG in 2002 post-IMI, LVEF 35% up to 50% postoperatively;   Depression    Difficulty sleeping    Erectile dysfunction    Essential hypertension    Frequency of urination    History of bipolar disorder    Hyperlipidemia    IBS (irritable bowel syndrome)    Myocardial infarction (HCC) 2002   Past Surgical History   Past Surgical History:  Procedure Laterality Date   BIOPSY  03/28/2018   Procedure: BIOPSY;  Surgeon: Malissa Hippo, MD;  Location: [facility];  Service: Endoscopy;;  esophagus   BIOPSY  03/09/2021   Procedure: BIOPSY;  Surgeon: Dolores Frame, MD;  Location: [facility];  Service: Gastroenterology;;   BIV PACEMAKER INSERTION CRT-P N/A 05/12/2020   Procedure: BIV PACEMAKER INSERTION CRT-P;  Surgeon: Hillis Range, MD;  Location: [facility];  Service: Cardiovascular;  Laterality: N/A;   CATARACT EXTRACTION W/PHACO Left 12/29/2014   Procedure: CATARACT EXTRACTION PHACO AND INTRAOCULAR LENS PLACEMENT (IOC);  Surgeon: Susa Simmonds, MD;  Location: [facility];  Service: Ophthalmology;  Laterality: Left;  CDE:3.71   CATARACT EXTRACTION W/PHACO Right 03/30/2015   Procedure: CATARACT EXTRACTION PHACO AND INTRAOCULAR LENS PLACEMENT RIGHT EYE CDE=2.56;  Surgeon: Susa Simmonds, MD;  Location: [facility];  Service: Ophthalmology;  Laterality: Right;   CIRCUMCISION  10/11/2003   COLONOSCOPY N/A 07/11/2012   rehman,transverse colon polyp (benign lymphoid); tortuous colon; prep adequate with much suction/lavage; hemorrhoids.   CORONARY ARTERY BYPASS GRAFT  08/10/2000   Dr. Tyrone Sage - LIMA to LAD, SVG to diagonal, SVG to PDA TRIPLE BYPASS   CYSTOSCOPY W/ RETROGRADES  Bilateral 03/02/2015   Procedure: CYSTOSCOPY WITH RIGHT RETROGRADE PYELOGRAM,  ATTEMPTED LEFT RETROGRADE PYELOGRAM;  Surgeon: Malen Gauze, MD;  Location: [facility];  Service: Urology;  Laterality: Bilateral;   ESOPHAGEAL DILATION N/A 03/28/2018   Procedure: ESOPHAGEAL DILATION;  Surgeon: Malissa Hippo, MD;  Location: [facility];  Service: Endoscopy;  Laterality: N/A;   ESOPHAGEAL DILATION N/A 03/15/2022   Procedure: ESOPHAGEAL DILATION;  Surgeon: Dolores Frame, MD;  [facility]: [facility];  Service: Gastroenterology;  Laterality: N/A;   ESOPHAGOGASTRODUODENOSCOPY N/A 03/28/2018   rehman,Abnormal  esophageal motility. mild schatzki ring at GEJ, dilated, patch of salmon colored mucosa at distal esophagus. Barrett's esophagus. 2cm HH. erosive gastropathy, normal pylorus, duodenal erosions w/o bleeding. normal second portion of duodenum   ESOPHAGOGASTRODUODENOSCOPY (EGD) WITH PROPOFOL N/A 03/09/2021   Procedure: ESOPHAGOGASTRODUODENOSCOPY (EGD) WITH PROPOFOL;  Surgeon: Dolores Frame, MD;  [facility];  Service: Gastroenterology;  Laterality: N/A;  945   ESOPHAGOGASTRODUODENOSCOPY (EGD) WITH PROPOFOL N/A 03/15/2022   Procedure: ESOPHAGOGASTRODUODENOSCOPY (EGD) WITH PROPOFOL;  Surgeon: Dolores Frame, MD;  [facility]: [facility];  Service: Gastroenterology;  Laterality: N/A;  1045am, asa 3   TEMPORARY PACEMAKER N/A 05/12/2020   Procedure: TEMPORARY PACEMAKER;  Surgeon: Swaziland, Peter M, MD;  Location: [facility] CV [facility];  Service: Cardiovascular;  Laterality: N/A;   TONSILLECTOMY     TRANSURETHRAL RESECTION OF BLADDER TUMOR N/A 01/26/2015   Procedure: TRANSURETHRAL RESECTION OF BLADDER TUMOR (TURBT);  Surgeon: Malen Gauze, MD;  Location: [facility];  Service: Urology;  Laterality: N/A;   TRANSURETHRAL RESECTION OF BLADDER TUMOR WITH GYRUS (TURBT-GYRUS) N/A 03/02/2015   Procedure: TRANSURETHRAL RESECTION OF BLADDER TUMOR WITH GYRUS (TURBT-GYRUS);  Surgeon: Malen Gauze, MD;  Location: [facility];  Service: Urology;  Laterality: N/A;   TRANSURETHRAL RESECTION OF PROSTATE N/A 01/26/2015   Procedure: TRANSURETHRAL RESECTION OF THE PROSTATE WITH GYRUS INSTRUMENTS;  Surgeon: Malen Gauze, MD;  Location: [facility];  Service: Urology;  Laterality: N/A;   Family History  Family History  Problem Relation Age of Onset   Asthma Mother    Heart attack Father 2   Diabetes Brother    Social History  reports that he quit smoking about 51 years ago. His smoking use included cigarettes. He started smoking about 66 years ago. He has a 15 pack-year smoking history. He has  been exposed to tobacco smoke. He has never used smokeless tobacco. He reports that he does not currently use alcohol. He reports that he does not use drugs. Allergies  Allergies  Allergen Reactions   Spironolactone Other (See Comments)    Hyperkalemia   Home medications Prior to Admission medications   Medication Sig Start Date End Date Taking? Authorizing Provider  aspirin EC 81 MG tablet Take 1 tablet (81 mg total) by mouth daily. 03/31/18  Yes Rehman, Joline Maxcy, MD  atorvastatin (LIPITOR) 80 MG tablet Take 1 tablet (80 mg total) by mouth at bedtime. 01/19/22  Yes Jonelle Sidle, MD  B Complex-C (SUPER B COMPLEX PO) Take 1 capsule by mouth daily.   Yes [provider]  bisoprolol (ZEBETA) 5 MG tablet TAKE 1/2 TABLET(2.5 MG) BY MOUTH DAILY 03/28/22  Yes Jonelle Sidle, MD  carbamazepine (TEGRETOL) 200 MG tablet Take 400 mg by mouth 2 (two) times daily.    Yes [provider]  divalproex (DEPAKOTE) 250 MG DR tablet Take 250 mg by mouth 2 (two) times daily. 02/08/22  Yes [provider]  empagliflozin (JARDIANCE)  10 MG TABS tablet Take 1 tablet (10 mg total) by mouth daily before breakfast. 07/27/22  Yes Jonelle Sidle, MD  ENTRESTO 24-26 MG TAKE 1 TABLET BY MOUTH TWICE DAILY 04/25/22  Yes Jonelle Sidle, MD  EPINEPHrine 0.3 mg/0.3 mL IJ SOAJ injection Inject 0.3 mg into the muscle as needed for anaphylaxis. 09/13/21  Yes Pollina, Canary Brim, MD  famotidine (PEPCID) 40 MG tablet TAKE (1) TABLET BY MOUTH AT BEDTIME. 03/08/22  Yes Carlan, Chelsea L, NP  furosemide (LASIX) 40 MG tablet Take 0.5-1 tablets (20-40 mg total) by mouth daily. 20mg  one day then 40mg  the other day alternating 20mg -40mg  01/28/22  Yes Jonelle Sidle, MD  LORazepam (ATIVAN) 0.5 MG tablet Take 0.5 mg by mouth 2 (two) times daily. 08/04/22  Yes [provider]  pantoprazole (PROTONIX) 40 MG tablet Take 1 tablet (40 mg total) by mouth daily before breakfast. 12/16/21  Yes Carlan,  Chelsea L, NP  silodosin (RAPAFLO) 8 MG CAPS capsule Take 1 capsule (8 mg total) by mouth daily with breakfast. 01/28/22  Yes McKenzie, Mardene Celeste, MD  temazepam (RESTORIL) 15 MG capsule Take 15 mg by mouth at bedtime as needed for sleep. 03/01/22  Yes [provider]     Vitals:   08/21/22 1500 08/21/22 1530 08/21/22 1545 08/21/22 1600  BP: 128/80 96/82 125/69   Pulse: 60 60 60   Resp: (!) 24 19 15    Temp:    98.2 F (36.8 C)  TempSrc:    Oral  SpO2: 98% 98% 97%   Weight:      Height:       Exam Gen alert, no distress, 2L [place] No rash, cyanosis or gangrene Sclera anicteric, throat clear  No jvd or bruits Chest clear bilat to bases, no rales/ wheezing RRR no MRG Abd soft ntnd no mass or ascites +bs GU normal male MS no joint effusions or deformity Ext no LE or UE edema, no wounds or ulcers Neuro is alert, Ox 3 , nf   Triple lumen in the neck    Home meds include - aspirin, lipitor, bisprolol 2.5 every day, carbamazepine, depakote bid, empagliflozin, entresto 24-26, famotidine, furosemide 20mg  alternating w/ 40mg  every other day, lorazepam, protonix, sildosin, temazepam     Date   Creat  eGFR   2020   1.13   2022   0.98- 1.40   2023   0.78- 1.68    Feb 2024  1.11- 1.29 56- > 60 ml/min    8/10   3.78  15    08/21/22  3.55, 3.35 16, 18 ml/min     UA pending   Una, UCr pending    Renal US 8/10- showed bilat kidney 11-12 cm , no obstruction    BNP 2520   Troponin 8096    UOP yest - none recorded    UOP today - 860 recorded    CXR x 2 on 8/10 - no acute changes    Current BP's are 95- 130 / 50-80 on IV milrinone, levophed    Assessment/ Plan: AKI on CKD 3a - b/l creatinine 1.1- 1.3 from feb 2024, eGFR 56- >60 ml/min. Creat here was 3.7 in the setting of chest pain and NSTEMI w/ associated cardiogenic shock. Getting milrinone and levophed support w/ good UOP. Creatinine has improved a bit since admission. UA pending, renal US w/o obstruction. AKI likely ATN and/or  hemodynamic due to cardiogenic shock associated w/ nstemi. Lactic acid is coming down. Pt is  voiding on high-dose IV lasix and zaroxolyn this afternoon. CXR's x 2 w/o edema. Continue supportive care. No indication for CRRT at this time. Will follow.  CAD - suspected NSTEMI CAD hx of CABG ICM EF 25-30% VT - on IV amio      Rob Arlean Hopping  MD [facility] 08/21/2022, 4:54 PM  Recent Labs  Lab 08/20/22 1224 08/21/22 0009 08/21/22 0441 08/21/22 1211  HGB 12.3*  --  13.2  --   ALBUMIN 4.2  --   --   --   CALCIUM 8.6*   < > 8.3* 8.2*  CREATININE 3.78*   < > 3.55* 3.35*  K 5.2*   < > 5.3* 4.7   < > = values in this interval not displayed.   Inpatient medications:  aspirin EC  81 mg Oral Daily   atorvastatin  80 mg Oral QHS   carbamazepine  400 mg Oral BID   Chlorhexidine Gluconate Cloth  6 each Topical Daily   clopidogrel  75 mg Oral Daily   divalproex  250 mg Oral BID   metolazone  5 mg Oral BID   pantoprazole  40 mg Oral QAC breakfast    sodium chloride Stopped (08/20/22 1740)   sodium chloride     amiodarone 30 mg/hr (08/21/22 1613)   furosemide     heparin 1,200 Units/hr (08/21/22 1500)   milrinone 0.25 mcg/kg/min (08/21/22 1500)   norepinephrine (LEVOPHED) Adult infusion 8 mcg/min (08/21/22 1500)   Place/Maintain arterial line **AND** sodium chloride, temazepam
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
## 2022-08-21 NOTE — Progress Notes (Signed)
ANTICOAGULATION CONSULT NOTE   Pharmacy Consult for Heparin Indication: chest pain/ACS  Allergies  Allergen Reactions   Spironolactone Other (See Comments)    Hyperkalemia    Patient Measurements: Height: 6' (182.9 cm) Weight: 97.4 kg (214 lb 11.7 oz) IBW/kg (Calculated) : 77.6 HEPARIN DW (KG): 97.3   Vital Signs: Temp: 98 F (36.7 C) (08/11 0300) Temp Source: Oral (08/11 0300) BP: 114/74 (08/11 1000) Pulse Rate: 60 (08/11 1030)  Labs: Recent Labs    08/20/22 1155 08/20/22 1224 08/21/22 0009 08/21/22 0441  HGB  --  12.3*  --  13.2  HCT  --  38.1*  --  39.6  PLT  --  114*  --  134*  APTT 28  --   --   --   HEPARINUNFRC  --   --  0.30 0.36  CREATININE  --  3.78* 3.63* 3.55*  TROPONINIHS  --  13,275*  --   --     Estimated Creatinine Clearance: 19.4 mL/min (A) (by C-G formula based on SCr of 3.55 mg/dL (H)).   Medical History: Past Medical History:  Diagnosis Date   Allergic rhinitis    Arthritis    Asthma    Childhood   Atrial fibrillation (HCC)    Remote history - WARCEF   Bladder cancer (HCC)    BPH (benign prostatic hyperplasia)    Cardiomyopathy (HCC)    CHF (congestive heart failure) (HCC)    a. EF 30-35% by echo in 2018 and 10/2018, at 25-30% in 05/2020   Coronary atherosclerosis of native coronary artery    Multivessel s/p CABG in 2002 post-IMI, LVEF 35% up to 50% postoperatively;   Depression    Difficulty sleeping    Erectile dysfunction    Essential hypertension    Frequency of urination    History of bipolar disorder    Hyperlipidemia    IBS (irritable bowel syndrome)    Myocardial infarction (HCC) 2002    Medications:  See med rec  Assessment: 82 y.o. male.  He has a history of cardiac disease, cardiomyopathy, pacemaker, chronic constipation and diarrhea, dysphagia.  He said he has had on and off diarrhea for the last few days. Patient had chest pain yesterday. Not on oral anticoagulants. MD asked pharmacy to start heparin without  bolus. Followed by cardiology in OP clinic.  Heparin drip 1200uts/hr with heparin level at goal 0.3 Cbc stable no bleeding noted   Goal of Therapy:  Heparin level 0.3-0.7 units/ml Monitor platelets by anticoagulation protocol: Yes   Plan:  Cont heparin 1200 units/hr Heparin level, cbc daily    Leota Sauers Pharm.D. CPP, BCPS Clinical Pharmacist [phone] 08/21/2022 10:56 AM

## 2022-08-21 NOTE — Progress Notes (Signed)
ANTICOAGULATION CONSULT NOTE   Pharmacy Consult for Heparin Indication: chest pain/ACS  Allergies  Allergen Reactions   Spironolactone Other (See Comments)    Hyperkalemia    Patient Measurements: Height: 6' (182.9 cm) Weight: 98 kg (216 lb 0.8 oz) IBW/kg (Calculated) : 77.6 HEPARIN DW (KG): 97.3   Vital Signs: Temp: 97.9 F (36.6 C) (08/10 2000) Temp Source: Oral (08/10 2000) BP: 88/65 (08/11 0015) Pulse Rate: 60 (08/11 0015)  Labs: Recent Labs    08/20/22 1155 08/20/22 1224 08/21/22 0009  HGB  --  12.3*  --   HCT  --  38.1*  --   PLT  --  114*  --   APTT 28  --   --   HEPARINUNFRC  --   --  0.30  CREATININE  --  3.78* 3.63*  TROPONINIHS  --  13,275*  --     Estimated Creatinine Clearance: 19 mL/min (A) (by C-G formula based on SCr of 3.63 mg/dL (H)).   Medical History: Past Medical History:  Diagnosis Date   Allergic rhinitis    Arthritis    Asthma    Childhood   Atrial fibrillation (HCC)    Remote history - WARCEF   Bladder cancer (HCC)    BPH (benign prostatic hyperplasia)    Cardiomyopathy (HCC)    CHF (congestive heart failure) (HCC)    a. EF 30-35% by echo in 2018 and 10/2018, at 25-30% in 05/2020   Coronary atherosclerosis of native coronary artery    Multivessel s/p CABG in 2002 post-IMI, LVEF 35% up to 50% postoperatively;   Depression    Difficulty sleeping    Erectile dysfunction    Essential hypertension    Frequency of urination    History of bipolar disorder    Hyperlipidemia    IBS (irritable bowel syndrome)    Myocardial infarction (HCC) 2002    Medications:  See med rec  Assessment: 82 y.o. male.  He has a history of cardiac disease, cardiomyopathy, pacemaker, CHF, chronic constipation and diarrhea, dysphagia.  He said he has had on and off diarrhea for the last few days. Patient had chest pain yesterday. Not on oral anticoagulants. MD asked pharmacy to start heparin without bolus. Followed by cardiology in OP clinic.  8/11  AM update:  Heparin level therapeutic   Goal of Therapy:  Heparin level 0.3-0.7 units/ml Monitor platelets by anticoagulation protocol: Yes   Plan:  Cont heparin 1200 units/hr Heparin level with AM labs  Abran Duke, PharmD, BCPS Clinical Pharmacist Phone: [phone]

## 2022-08-21 NOTE — Progress Notes (Signed)
  Echocardiogram 2D Echocardiogram has been performed. Cardiology notified of results.  Grant Stevens 08/21/2022, 8:37 AM

## 2022-08-21 NOTE — Plan of Care (Signed)
  Problem: Education: Goal: Knowledge of General Education information will improve Description: Including pain rating scale, medication(s)/side effects and non-pharmacologic comfort measures Outcome: Progressing   Problem: Clinical Measurements: Goal: Ability to maintain clinical measurements within normal limits will improve Outcome: Progressing Goal: Diagnostic test results will improve Outcome: Progressing   

## 2022-08-21 NOTE — Progress Notes (Signed)
NAME:  Grant Stevens, MRN:  [id number], DOB:  November 02, 1940, LOS: 1 ADMISSION DATE:  08/20/2022, CONSULTATION DATE:  08/20/2022 REFERRING MD:  Dr. Charm Barges, ER, CHIEF COMPLAINT:  Chest pain   History of Present Illness:  82 yo male former smoker with hx of CAD s/p CABG, ischemic CM with HFrEF (EF 25%), and conduction system disease s/p PM presented to [facility] ER after developing diarrhea on 08/19/22.  He had chest pain about 3 days prior to admission.  He described this as mid-sternal, lasting about 2 hours, and associated with dyspnea and diaphoresis.  He said it felt like his chest pain before his heart attack several years ago.  He didn't take any nitroglycerin.  The pain and dyspnea eventually resolve.  In the ER he was found to have AKI, mild hyperkalemia, significant elevation in troponin, mild lactic acidosis, hypotension.  Started on heparin and levophed gtt.  Transferred to [facility] for further cardiac management and PCCM consulted to admit to ICU.  Pertinent  Medical History  Allergies, Arthritis, A fib, Bladder cancer, BPH, HFrEF, CAD s/p CABG, Depression, ED, HTN, Bipolar, HLD, IBS, s/p PM  Significant Hospital Events: Including procedures, antibiotic start and stop dates in addition to other pertinent events   8/10 transfer from [facility] ER to [facility], start heparin and levophed gtt, cardiology consulted  Interim History / Subjective:  Patient stated feeling well Overnight went into sustained VT's CVP remained elevated Coox is 41% On low-dose Levophed  Objective   Blood pressure 90/71, pulse 60, temperature 98 F (36.7 C), temperature source Oral, resp. rate 16, height 6' (1.829 m), weight 97.4 kg, SpO2 98%. CVP:  [18 mmHg-19 mmHg] 19 mmHg      Intake/Output Summary (Last 24 hours) at 08/21/2022 0800 Last data filed at 08/21/2022 0600 Gross per 24 hour  Intake 1819 ml  Output --  Net 1819 ml   Filed Weights   08/20/22 1135 08/21/22 0600  Weight: 98 kg 97.4 kg    Examination: General:  Chronically ill-appearing male, lying on the bed HEENT: [place]/AT, eyes anicteric.  moist mucus membranes Neuro: Alert, awake following commands Chest: Coarse breath sounds, no wheezes or rhonchi Heart: Regular rate and rhythm, no murmurs or gallops Abdomen: Soft, nontender, nondistended, bowel sounds present Skin: No rash  Labs and images were reviewed  Resolved Hospital Problem list     Assessment & Plan:  Coronary artery disease status post CABG, presented with subacute NSTEMI Acute on chronic HFrEF with cardiogenic shock Sustained V. tach Hypertension/hyperlipidemia Appreciate cardiology input Continue aspirin, Plavix and heparin infusion Echocardiogram was done showing EF less than 10% BNP is elevated Serum troponin elevated to >13k Continue Levophed Milrinone was added Patient received Lasix 120 mg x 1 Will be started on Lasix infusion Hold zebeta, jardiance, entresto, lasix Went into sustained V. tach overnight Bolused with lidocaine and amiodarone On lidocaine and Amio infusion  AKI due to ischemic ATN in the setting of shock Mild hyperkalemia. Acute metabolic acidosis BPH, Bladder cancer. Baseline creatinine 1.11 from 02/28/22 Serum creatinine remained elevated to 3.5 Monitor intake and output Avoid nephrotoxic agent Serum potassium is at 5.3 Serum bicarbonate trended down to 18  Lactic acidosis. Resolved  Intermittent diarrhea with hx of IBS. Hemo-occult positive stool >> performed in ER. GERD. Diarrhea has improved Continue Protonix Monitor H&H  Chronic thrombocytopenia Platelet count remained low but stable  Bipolar disease. Continue tegretol, temazepam  Best Practice (right click and "Reselect all SmartList Selections" daily)   Diet/type: Regular consistency (  see orders) DVT prophylaxis: systemic heparin GI prophylaxis: PPI Lines: N/A Foley:  N/A Code Status:  full code Last date of multidisciplinary goals of care discussion [8/11: Patient  was updated at bedside]  Labs   CBC: Recent Labs  Lab 08/20/22 1224 08/21/22 0441  WBC 11.8* 15.3*  NEUTROABS 10.4*  --   HGB 12.3* 13.2  HCT 38.1* 39.6  MCV 96.2 95.2  PLT 114* 134*    Basic Metabolic Panel: Recent Labs  Lab 08/20/22 1224 08/21/22 0009 08/21/22 0441  NA 134* 133* 131*  K 5.2* 5.3* 5.3*  CL 99 99 99  CO2 20* 21* 18*  GLUCOSE 105* 147* 156*  BUN 56* 62* 62*  CREATININE 3.78* 3.63* 3.55*  CALCIUM 8.6* 8.3* 8.3*  MG  --  2.3  --    GFR: Estimated Creatinine Clearance: 19.4 mL/min (A) (by C-G formula based on SCr of 3.55 mg/dL (H)). Recent Labs  Lab 08/20/22 1224 08/21/22 0009 08/21/22 0441  WBC 11.8*  --  15.3*  LATICACIDVEN 2.3* 1.7 1.5    Liver Function Tests: Recent Labs  Lab 08/20/22 1224  AST 66*  ALT 40  ALKPHOS 66  BILITOT 0.7  PROT 7.2  ALBUMIN 4.2   Recent Labs  Lab 08/20/22 1224  LIPASE 38   No results for input(s): "AMMONIA" in the last 168 hours.  ABG    Component Value Date/Time   TCO2 27 05/12/2020 1102   O2SAT 43 08/21/2022 0447     Coagulation Profile: No results for input(s): "INR", "PROTIME" in the last 168 hours.  Cardiac Enzymes: No results for input(s): "CKTOTAL", "CKMB", "CKMBINDEX", "TROPONINI" in the last 168 hours.  HbA1C: Hgb A1c MFr Bld  Date/Time Value Ref Range Status  05/12/2020 11:01 AM 5.7 (H) 4.8 - 5.6 % Final    Comment:    (NOTE) Pre diabetes:          5.7%-6.4%  Diabetes:              >6.4%  Glycemic control for   <7.0% adults with diabetes     CBG: Recent Labs  Lab 08/21/22 0334  GLUCAP 159*    The patient is critically ill due to acute NSTEMI, cardiogenic shock, AKI.  Critical care was necessary to treat or prevent imminent or life-threatening deterioration.  Critical care was time spent personally by me on the following activities: development of treatment plan with patient and/or surrogate as well as nursing, discussions with consultants, evaluation of patient's  response to treatment, examination of patient, obtaining history from patient or surrogate, ordering and performing treatments and interventions, ordering and review of laboratory studies, ordering and review of radiographic studies, pulse oximetry, re-evaluation of patient's condition and participation in multidisciplinary rounds.   During this encounter critical care time was devoted to patient care services described in this note for 44 minutes.     Cheri Fowler, MD  Pulmonary Critical Care See Amion for pager If no response to pager, please call [phone] until 7pm After 7pm, Please call E-link [phone]

## 2022-08-21 NOTE — Plan of Care (Signed)
  Problem: Education: Goal: Knowledge of General Education information will improve Description: Including pain rating scale, medication(s)/side effects and non-pharmacologic comfort measures Outcome: Progressing   Problem: Clinical Measurements: Goal: Will remain free from infection Outcome: Progressing   Problem: Nutrition: Goal: Adequate nutrition will be maintained Outcome: Progressing   

## 2022-08-22 DIAGNOSIS — I472 Ventricular tachycardia, unspecified: Secondary | ICD-10-CM | POA: Diagnosis not present

## 2022-08-22 DIAGNOSIS — I5023 Acute on chronic systolic (congestive) heart failure: Secondary | ICD-10-CM | POA: Diagnosis not present

## 2022-08-22 DIAGNOSIS — R570 Cardiogenic shock: Secondary | ICD-10-CM | POA: Insufficient documentation

## 2022-08-22 DIAGNOSIS — I214 Non-ST elevation (NSTEMI) myocardial infarction: Secondary | ICD-10-CM | POA: Diagnosis not present

## 2022-08-22 DIAGNOSIS — I2510 Atherosclerotic heart disease of native coronary artery without angina pectoris: Secondary | ICD-10-CM | POA: Diagnosis not present

## 2022-08-22 LAB — BASIC METABOLIC PANEL
Anion gap: 14 (ref 5–15)
BUN: 68 mg/dL — ABNORMAL HIGH (ref 8–23)
CO2: 23 mmol/L (ref 22–32)
Calcium: 8.6 mg/dL — ABNORMAL LOW (ref 8.9–10.3)
Chloride: 91 mmol/L — ABNORMAL LOW (ref 98–111)
Creatinine, Ser: 2.75 mg/dL — ABNORMAL HIGH (ref 0.61–1.24)
GFR, Estimated: 22 mL/min — ABNORMAL LOW (ref >60)
Glucose, Bld: 118 mg/dL — ABNORMAL HIGH (ref 70–99)
Potassium: 4.9 mmol/L (ref 3.5–5.1)
Sodium: 128 mmol/L — ABNORMAL LOW (ref 135–145)

## 2022-08-22 MED ORDER — METOLAZONE 5 MG PO TABS: 5.0000 mg | ORAL_TABLET | Freq: Once | ORAL | Status: DC

## 2022-08-22 MED ORDER — HEPARIN (PORCINE) 25000 UT/250ML-% IV SOLN: 1200.0000 [IU]/h | INTRAVENOUS | Status: DC

## 2022-08-22 MED ORDER — FUROSEMIDE 10 MG/ML IJ SOLN
160.0000 mg | Freq: Two times a day (BID) | INTRAVENOUS | Status: AC
  Administered 2022-08-22: 160 mg via INTRAVENOUS
  Filled 2022-08-22: qty 10

## 2022-08-22 MED ORDER — MAGNESIUM SULFATE 2 GM/50ML IV SOLN
2.0000 g | Freq: Once | INTRAVENOUS | Status: AC
  Administered 2022-08-22: 2 g via INTRAVENOUS
  Filled 2022-08-22: qty 50

## 2022-08-22 MED ORDER — POTASSIUM CHLORIDE CRYS ER 20 MEQ PO TBCR
40.0000 meq | EXTENDED_RELEASE_TABLET | Freq: Once | ORAL | Status: AC
  Administered 2022-08-22: 40 meq via ORAL
  Filled 2022-08-22: qty 2

## 2022-08-22 NOTE — Progress Notes (Addendum)
Advanced Heart Failure Rounding Note  PCP-Cardiologist: Nona Dell, MD   Subjective:     CO-OX 63% on milrinone 0.25 + NE 8 >>2.   Scr starting to trend down, 3.7>>2.93 this am.   CVP 10. 5.3L UOP last 24 hrs with high dose IV lasix and 5 mg metolazone BID.  No recurrent VT. Continues on amiodarone.  Feeling well. No chest pain or dyspnea. Great appetite.   Objective:   Weight Range: 95.2 kg Body mass index is 28.46 kg/m.   Vital Signs:   Temp:  [97.7 F (36.5 C)-98.2 F (36.8 C)] 97.7 F (36.5 C) (08/11 2300) Pulse Rate:  [60-68] 60 (08/12 0600) Resp:  [14-28] 16 (08/12 0600) BP: (91-128)/(63-89) 104/89 (08/11 1930) SpO2:  [95 %-100 %] 95 % (08/12 0600) Weight:  [95.2 kg] 95.2 kg (08/12 0500) Last BM Date : 08/22/22  Weight change: Filed Weights   08/20/22 1135 08/21/22 0600 08/22/22 0500  Weight: 98 kg 97.4 kg 95.2 kg    Intake/Output:   Intake/Output Summary (Last 24 hours) at 08/22/2022 0719 Last data filed at 08/22/2022 0600 Gross per 24 hour  Intake 1799.84 ml  Output 5340 ml  Net -3540.16 ml      Physical Exam    General:  Well appearing. Sitting up in bed. HEENT: Normal Neck: Supple. JVP 8-10. L internal jugular SWAN Cor: PMI nondisplaced. Regular rate & rhythm. No rubs, gallops or murmurs. Lungs: Clear Abdomen: Soft, nontender, nondistended.  Extremities: No cyanosis, clubbing, rash, edema Neuro: Alert & orientedx3. Affect pleasant   Telemetry   AV paced, 60s. No recurrent VT.  Labs    CBC Recent Labs    08/20/22 1224 08/21/22 0441 08/22/22 0502  WBC 11.8* 15.3* 13.1*  NEUTROABS 10.4*  --   --   HGB 12.3* 13.2 11.9*  HCT 38.1* 39.6 35.5*  MCV 96.2 95.2 93.7  PLT 114* 134* 96*   Basic Metabolic Panel Recent Labs    63/01/60 0009 08/21/22 0441 08/21/22 1211 08/22/22 0502  NA 133*   < > 132* 128*  K 5.3*   < > 4.7 4.1  CL 99   < > 97* 91*  CO2 21*   < > 22 24  GLUCOSE 147*   < > 158* 105*  BUN 62*   < > 65*  67*  CREATININE 3.63*   < > 3.35* 2.93*  CALCIUM 8.3*   < > 8.2* 8.4*  MG 2.3  --   --  2.0   < > = values in this interval not displayed.   Liver Function Tests Recent Labs    08/20/22 1224  AST 66*  ALT 40  ALKPHOS 66  BILITOT 0.7  PROT 7.2  ALBUMIN 4.2   Recent Labs    08/20/22 1224  LIPASE 38   Cardiac Enzymes No results for input(s): "CKTOTAL", "CKMB", "CKMBINDEX", "TROPONINI" in the last 72 hours.  BNP: BNP (last 3 results) Recent Labs    08/21/22 0441  BNP 2,520.5*    ProBNP (last 3 results) No results for input(s): "PROBNP" in the last 8760 hours.   D-Dimer No results for input(s): "DDIMER" in the last 72 hours. Hemoglobin A1C No results for input(s): "HGBA1C" in the last 72 hours. Fasting Lipid Panel Recent Labs    08/22/22 0502  CHOL 132  HDL 50  LDLCALC 73  TRIG 45  CHOLHDL 2.6   Thyroid Function Tests Recent Labs    08/22/22 0502  TSH 1.499  Other results:   Imaging    ECHOCARDIOGRAM COMPLETE  Result Date: 08/21/2022    ECHOCARDIOGRAM REPORT   Patient Name:   RAYMIE PENTA Date of Exam: 08/21/2022 Medical Rec #:  [id number]        Height:       72.0 in Accession #:    [id number]       Weight:       214.7 lb Date of Birth:  05-08-1940         BSA:          2.196 m Patient Age:    82 years         BP:           90/71 mmHg Patient Gender: M                HR:           60 bpm. Exam Location:  Inpatient Procedure: 2D Echo, Cardiac Doppler, Color Doppler and Intracardiac            Opacification Agent STAT ECHO REPORT CONTAINS CRITICAL RESULT Indications:    I50.40* Unspecified combined systolic (congestive) and diastolic                 (congestive) heart failure  History:        Patient has prior history of Echocardiogram examinations, most                 recent 03/03/2022. CHF, CAD, Previous Myocardial Infarction and                 Acute MI, Abnormal ECG, Prior CABG and Pacemaker,                 Arrythmias:Heart block and Atrial  Fibrillation,                 Signs/Symptoms:Syncope, Dizziness/Lightheadedness and                 Hypotension; Risk Factors:Former Smoker and Sleep Apnea.  Sonographer:    Sheralyn Boatman RDCS Referring Phys: [id number] [facility] A CHANDRASEKHAR IMPRESSIONS  1. Left ventricular ejection fraction, by estimation, is <20%. The left ventricle has severely decreased function. The left ventricle demonstrates regional wall motion abnormalities (see scoring diagram/findings for description). The left ventricular internal cavity size was mildly to moderately dilated. Left ventricular diastolic parameters are consistent with Grade III diastolic dysfunction (restrictive).  2. Right ventricular systolic function is severely reduced. The right ventricular size is mildly enlarged. There is mildly elevated pulmonary artery systolic pressure. The estimated right ventricular systolic pressure is 38.9 mmHg.  3. The mitral valve is degenerative. Trivial mitral valve regurgitation. No evidence of mitral stenosis.  4. The aortic valve is tricuspid. There is moderate calcification of the aortic valve. Aortic valve regurgitation is not visualized. Aortic valve sclerosis is present, with no evidence of aortic valve stenosis.  5. The inferior vena cava is dilated in size with >50% respiratory variability, suggesting right atrial pressure of 8 mmHg. Comparison(s): Prior images reviewed side by side. LVEF is further decreased. No LV thrombus noted. FINDINGS  Left Ventricle: Left ventricular ejection fraction, by estimation, is <20%. The left ventricle has severely decreased function. The left ventricle demonstrates regional wall motion abnormalities. Definity contrast agent was given IV to delineate the left ventricular endocardial borders. The left ventricular internal cavity size was mildly to moderately dilated. There is no left ventricular hypertrophy. Left ventricular diastolic parameters are consistent with Grade  III diastolic dysfunction  (restrictive).  LV Wall Scoring: The apical lateral segment, apical septal segment, apical anterior segment, and apical inferior segment are aneurysmal. The mid anteroseptal segment, mid inferolateral segment, mid anterolateral segment, mid inferoseptal segment, mid anterior segment, and mid inferior segment are akinetic. The basal anteroseptal segment, basal inferolateral segment, basal anterolateral segment, basal anterior segment, basal inferior segment, and basal inferoseptal segment are hypokinetic. Right Ventricle: The right ventricular size is mildly enlarged. No increase in right ventricular wall thickness. Right ventricular systolic function is severely reduced. There is mildly elevated pulmonary artery systolic pressure. The tricuspid regurgitant velocity is 2.78 m/s, and with an assumed right atrial pressure of 8 mmHg, the estimated right ventricular systolic pressure is 38.9 mmHg. Left Atrium: Left atrial size was normal in size. Right Atrium: Right atrial size was normal in size. Pericardium: There is no evidence of pericardial effusion. Mitral Valve: The mitral valve is degenerative in appearance. Trivial mitral valve regurgitation. No evidence of mitral valve stenosis. Tricuspid Valve: The tricuspid valve is grossly normal. Tricuspid valve regurgitation is not demonstrated. No evidence of tricuspid stenosis. Aortic Valve: The aortic valve is tricuspid. There is moderate calcification of the aortic valve. Aortic valve regurgitation is not visualized. Aortic valve sclerosis is present, with no evidence of aortic valve stenosis. Pulmonic Valve: The pulmonic valve was normal in structure. Pulmonic valve regurgitation is not visualized. No evidence of pulmonic stenosis. Aorta: The aortic root and ascending aorta are structurally normal, with no evidence of dilitation. Venous: The inferior vena cava is dilated in size with greater than 50% respiratory variability, suggesting right atrial pressure of 8 mmHg.  IAS/Shunts: No atrial level shunt detected by color flow Doppler.  LEFT VENTRICLE PLAX 2D LVIDd:         5.90 cm      Diastology LVIDs:         5.60 cm      LV e' medial:    3.16 cm/s LV PW:         1.80 cm      LV E/e' medial:  18.7 LV IVS:        1.20 cm      LV e' lateral:   8.11 cm/s LVOT diam:     2.40 cm      LV E/e' lateral: 7.3 LV SV:         43 LV SV Index:   19 LVOT Area:     4.52 cm  LV Volumes (MOD) LV vol d, MOD A2C: 165.0 ml LV vol d, MOD A4C: 207.0 ml LV vol s, MOD A2C: 152.0 ml LV vol s, MOD A4C: 191.0 ml LV SV MOD A2C:     13.0 ml LV SV MOD A4C:     207.0 ml LV SV MOD BP:      15.0 ml RIGHT VENTRICLE            IVC RV S prime:     2.61 cm/s  IVC diam: 3.30 cm TAPSE (M-mode): 0.6 cm LEFT ATRIUM             Index        RIGHT ATRIUM           Index LA diam:        4.90 cm 2.23 cm/m   RA Area:     20.20 cm LA Vol (A2C):   44.7 ml 20.36 ml/m  RA Volume:   64.90 ml  29.56 ml/m LA Vol (A4C):  68.8 ml 31.33 ml/m LA Biplane Vol: 57.7 ml 26.28 ml/m  AORTIC VALVE LVOT Vmax:   70.30 cm/s LVOT Vmean:  44.800 cm/s LVOT VTI:    0.094 m  AORTA Ao Root diam: 3.60 cm Ao Asc diam:  3.80 cm MITRAL VALVE               TRICUSPID VALVE MV Area (PHT): 4.31 cm    TR Peak grad:   30.9 mmHg MV Decel Time: 176 msec    TR Vmax:        278.00 cm/s MV E velocity: 59.20 cm/s MV A velocity: 44.50 cm/s  SHUNTS MV E/A ratio:  1.33        Systemic VTI:  0.09 m                            Systemic Diam: 2.40 cm Riley Lam MD Electronically signed by Riley Lam MD Signature Date/Time: 08/21/2022/8:54:00 AM    Final      Medications:     Scheduled Medications:  aspirin EC  81 mg Oral Daily   atorvastatin  80 mg Oral QHS   carbamazepine  400 mg Oral BID   Chlorhexidine Gluconate Cloth  6 each Topical Daily   clopidogrel  75 mg Oral Daily   divalproex  250 mg Oral BID   metolazone  5 mg Oral BID   pantoprazole  40 mg Oral QAC breakfast    Infusions:  sodium chloride Stopped (08/20/22 1740)    sodium chloride     amiodarone 30 mg/hr (08/22/22 0600)   furosemide Stopped (08/22/22 0541)   heparin 1,200 Units/hr (08/22/22 0600)   milrinone 0.25 mcg/kg/min (08/22/22 0600)   norepinephrine (LEVOPHED) Adult infusion 2 mcg/min (08/22/22 0600)    PRN Medications: Place/Maintain arterial line **AND** sodium chloride, temazepam   Assessment/Plan    1. Acute on chronic systolic HF -> cardiogenic shock - In setting of OOH infarct - Echo 2/24 EF 25-30% RV mildly reduced - Echo 08/21/22: EF < 20% RV severely reduced - Initial co-ox 31% - Currently on NE 2 + milrinone 0.25 with CO-OX 63%. Wean NE as able.  - Off GDMT due to shock, AKI - Diuresing well. CVP 10. Cut back lasix to 160 mg BID and metolazone 5 mg X 1. - Not candidate for advanced therapies with age - Eventually will need coronary angio if/when SCr improves. Can also consider cMRI - Viral panel and COVID test not completed. Check today.  - TED hose   2. CAD with possible NSTEMI - h/o CAD s/p CABG  - suspect OOH infarct with loss of SVG - will treat empirically for ACS  - heparin x 48 hours - can stop today - DAPT/statin  - Eventually will need coronary angio if/when SCr improves.    3. Ventricular tachycardia - No recurrence overnight - continue amio gtt - lidocaine off - Keep K > 4.0 Mg > 2.0   4. AKI - due to shock/ATN - baseline Scr 1.1 , peaked at 3.8. Now starting to trend down. - continue hemodynamic support - Renal following   5. SSS - s/p pacer  6. Remote history of atrial fibrillation - Has not been anticoagulated - No recurrence this admission. Would need anticoagulation if recurs.   7. Limited DNR - Spoke with him in detail about GOC wishes - Ok with intubation and defibrillation  - Would not want CPR  Out of bed today  Length of Stay: 2  FINCH, LINDSAY N, PA-C  08/22/2022, 7:19 AM  Advanced Heart Failure Team Pager [phone] (M-F; 7a - 5p)  Please contact [facility] for  night-coverage after hours (5p -7a ) and weekends on [email or web address]  Agree with above.   Remains on NE and milrinone. Co-ox improved. No further CP or VT.   Excellent diuresis. CVP 10. Scr improving  General:  Sitting up in bed  No resp difficulty HEENT: normal Neck: supple. LIJ TLC. Carotids 2+ bilat; no bruits. No lymphadenopathy or thryomegaly appreciated. Cor: PMI nondisplaced. Regular rate & rhythm. No rubs, gallops or murmurs. Lungs: clear Abdomen: soft, nontender, nondistended. No hepatosplenomegaly. No bruits or masses. Good bowel sounds. Extremities: no cyanosis, clubbing, rash, 1+ edema Neuro: alert & orientedx3, cranial nerves grossly intact. moves all 4 extremities w/o difficulty. Affect pleasant  Continue hemodynamic support. Agree with cutting back diuretics. Can hold metolazone.   Cath if/when Scr permits.   CRITICAL CARE Performed by: Arvilla Meres  Total critical care time: 35 minutes  Critical care time was exclusive of separately billable procedures and treating other patients.  Critical care was necessary to treat or prevent imminent or life-threatening deterioration.  Critical care was time spent personally by me (independent of midlevel providers or residents) on the following activities: development of treatment plan with patient and/or surrogate as well as nursing, discussions with consultants, evaluation of patient's response to treatment, examination of patient, obtaining history from patient or surrogate, ordering and performing treatments and interventions, ordering and review of laboratory studies, ordering and review of radiographic studies, pulse oximetry and re-evaluation of patient's condition.  Arvilla Meres, MD  8:34 AM

## 2022-08-22 NOTE — Plan of Care (Signed)
  Problem: Education: Goal: Knowledge of General Education information will improve Description: Including pain rating scale, medication(s)/side effects and non-pharmacologic comfort measures Outcome: Progressing   Problem: Clinical Measurements: Goal: Will remain free from infection Outcome: Progressing   Problem: Activity: Goal: Risk for activity intolerance will decrease Outcome: Progressing   

## 2022-08-22 NOTE — Progress Notes (Signed)
[facility] Progress Note   82 y.o. year-old w/ PMH  NSTEMI w/ Hx of CAD sp CABG, ischemic CM w/ EF 25%, sp PPM, atrial fib, bladder cancer, bipolar d/o. Cardiogenic shock w/ high LA and hypotension, mild ^K+ and AKI.  Pt was started on IV heparin and levophed gtt + milrinone + high dose diuretics. No contrast.    Assessment/ Plan:   AKI on CKD 3a - b/l creatinine 1.1- 1.3 from feb 2024, eGFR 56- >60 ml/min. Creat here was 3.7 in the setting of chest pain and NSTEMI w/ associated cardiogenic shock. Had been getting milrinone and levophed support w/ good UOP + high dose diuretics. - renal US w/o obstruction. AKI likely ATN and/or hemodynamic due to cardiogenic shock associated w/ nstemi. Lactic acid is coming down.  - CO-OX 63% and cr starting to trend downwards. 5.3L UOP /24hrs and high dose IV lasix and zaroxolyn decreased for today. . Continue supportive care and will follow closely with you. No indication for CRRT at this time.   CAD - suspected NSTEMI CAD hx of CABG ICM EF 25-30% VT - on IV amio  Subjective:   Feeling better; denies CP, breathing more comfortable.  Denies f/c/n/v.   CO-OX 63% on milrinone, levo off now. CVP 10 with great UOP 5.3L /24hrs.   Objective:   BP (!) 93/48   Pulse 60   Temp 98 F (36.7 C) (Oral)   Resp 16   Ht 6' (1.829 m)   Wt 95.2 kg   SpO2 94%   BMI 28.46 kg/m   Intake/Output Summary (Last 24 hours) at 08/22/2022 1027 Last data filed at 08/22/2022 0900 Gross per 24 hour  Intake 1894.26 ml  Output 5340 ml  Net -3445.74 ml   Weight change: -2.8 kg  Physical Exam: Gen alert, no distress No rash, cyanosis or gangrene Chest clear bilat to bases, no rales/ wheezing RRR no MRG Abd SNDNT+BS Ext tr LE edema Neuro is alert, Ox 3 , nf Triple lumen in the neck  Imaging: ECHOCARDIOGRAM COMPLETE  Result Date: 08/21/2022    ECHOCARDIOGRAM REPORT   Patient Name:   Grant Stevens Date of Exam: 08/21/2022 Medical Rec #:  [id number]         Height:       72.0 in Accession #:    [id number]       Weight:       214.7 lb Date of Birth:  12-11-1940         BSA:          2.196 m Patient Age:    82 years         BP:           90/71 mmHg Patient Gender: M                HR:           60 bpm. Exam Location:  Inpatient Procedure: 2D Echo, Cardiac Doppler, Color Doppler and Intracardiac            Opacification Agent STAT ECHO REPORT CONTAINS CRITICAL RESULT Indications:    I50.40* Unspecified combined systolic (congestive) and diastolic                 (congestive) heart failure  History:        Patient has prior history of Echocardiogram examinations, most                 recent 03/03/2022. CHF, CAD,  Previous Myocardial Infarction and                 Acute MI, Abnormal ECG, Prior CABG and Pacemaker,                 Arrythmias:Heart block and Atrial Fibrillation,                 Signs/Symptoms:Syncope, Dizziness/Lightheadedness and                 Hypotension; Risk Factors:Former Smoker and Sleep Apnea.  Sonographer:    Sheralyn Boatman RDCS Referring Phys: [id number] [facility] A CHANDRASEKHAR IMPRESSIONS  1. Left ventricular ejection fraction, by estimation, is <20%. The left ventricle has severely decreased function. The left ventricle demonstrates regional wall motion abnormalities (see scoring diagram/findings for description). The left ventricular internal cavity size was mildly to moderately dilated. Left ventricular diastolic parameters are consistent with Grade III diastolic dysfunction (restrictive).  2. Right ventricular systolic function is severely reduced. The right ventricular size is mildly enlarged. There is mildly elevated pulmonary artery systolic pressure. The estimated right ventricular systolic pressure is 38.9 mmHg.  3. The mitral valve is degenerative. Trivial mitral valve regurgitation. No evidence of mitral stenosis.  4. The aortic valve is tricuspid. There is moderate calcification of the aortic valve. Aortic valve regurgitation is not  visualized. Aortic valve sclerosis is present, with no evidence of aortic valve stenosis.  5. The inferior vena cava is dilated in size with >50% respiratory variability, suggesting right atrial pressure of 8 mmHg. Comparison(s): Prior images reviewed side by side. LVEF is further decreased. No LV thrombus noted. FINDINGS  Left Ventricle: Left ventricular ejection fraction, by estimation, is <20%. The left ventricle has severely decreased function. The left ventricle demonstrates regional wall motion abnormalities. Definity contrast agent was given IV to delineate the left ventricular endocardial borders. The left ventricular internal cavity size was mildly to moderately dilated. There is no left ventricular hypertrophy. Left ventricular diastolic parameters are consistent with Grade III diastolic dysfunction (restrictive).  LV Wall Scoring: The apical lateral segment, apical septal segment, apical anterior segment, and apical inferior segment are aneurysmal. The mid anteroseptal segment, mid inferolateral segment, mid anterolateral segment, mid inferoseptal segment, mid anterior segment, and mid inferior segment are akinetic. The basal anteroseptal segment, basal inferolateral segment, basal anterolateral segment, basal anterior segment, basal inferior segment, and basal inferoseptal segment are hypokinetic. Right Ventricle: The right ventricular size is mildly enlarged. No increase in right ventricular wall thickness. Right ventricular systolic function is severely reduced. There is mildly elevated pulmonary artery systolic pressure. The tricuspid regurgitant velocity is 2.78 m/s, and with an assumed right atrial pressure of 8 mmHg, the estimated right ventricular systolic pressure is 38.9 mmHg. Left Atrium: Left atrial size was normal in size. Right Atrium: Right atrial size was normal in size. Pericardium: There is no evidence of pericardial effusion. Mitral Valve: The mitral valve is degenerative in appearance.  Trivial mitral valve regurgitation. No evidence of mitral valve stenosis. Tricuspid Valve: The tricuspid valve is grossly normal. Tricuspid valve regurgitation is not demonstrated. No evidence of tricuspid stenosis. Aortic Valve: The aortic valve is tricuspid. There is moderate calcification of the aortic valve. Aortic valve regurgitation is not visualized. Aortic valve sclerosis is present, with no evidence of aortic valve stenosis. Pulmonic Valve: The pulmonic valve was normal in structure. Pulmonic valve regurgitation is not visualized. No evidence of pulmonic stenosis. Aorta: The aortic root and ascending aorta are structurally normal, with no evidence of  dilitation. Venous: The inferior vena cava is dilated in size with greater than 50% respiratory variability, suggesting right atrial pressure of 8 mmHg. IAS/Shunts: No atrial level shunt detected by color flow Doppler.  LEFT VENTRICLE PLAX 2D LVIDd:         5.90 cm      Diastology LVIDs:         5.60 cm      LV e' medial:    3.16 cm/s LV PW:         1.80 cm      LV E/e' medial:  18.7 LV IVS:        1.20 cm      LV e' lateral:   8.11 cm/s LVOT diam:     2.40 cm      LV E/e' lateral: 7.3 LV SV:         43 LV SV Index:   19 LVOT Area:     4.52 cm  LV Volumes (MOD) LV vol d, MOD A2C: 165.0 ml LV vol d, MOD A4C: 207.0 ml LV vol s, MOD A2C: 152.0 ml LV vol s, MOD A4C: 191.0 ml LV SV MOD A2C:     13.0 ml LV SV MOD A4C:     207.0 ml LV SV MOD BP:      15.0 ml RIGHT VENTRICLE            IVC RV S prime:     2.61 cm/s  IVC diam: 3.30 cm TAPSE (M-mode): 0.6 cm LEFT ATRIUM             Index        RIGHT ATRIUM           Index LA diam:        4.90 cm 2.23 cm/m   RA Area:     20.20 cm LA Vol (A2C):   44.7 ml 20.36 ml/m  RA Volume:   64.90 ml  29.56 ml/m LA Vol (A4C):   68.8 ml 31.33 ml/m LA Biplane Vol: 57.7 ml 26.28 ml/m  AORTIC VALVE LVOT Vmax:   70.30 cm/s LVOT Vmean:  44.800 cm/s LVOT VTI:    0.094 m  AORTA Ao Root diam: 3.60 cm Ao Asc diam:  3.80 cm MITRAL VALVE                TRICUSPID VALVE MV Area (PHT): 4.31 cm    TR Peak grad:   30.9 mmHg MV Decel Time: 176 msec    TR Vmax:        278.00 cm/s MV E velocity: 59.20 cm/s MV A velocity: 44.50 cm/s  SHUNTS MV E/A ratio:  1.33        Systemic VTI:  0.09 m                            Systemic Diam: 2.40 cm Riley Lam MD Electronically signed by Riley Lam MD Signature Date/Time: 08/21/2022/8:54:00 AM    Final    DG CHEST PORT 1 VIEW  Result Date: 08/20/2022 CLINICAL DATA:  Check central line placement EXAM: PORTABLE CHEST 1 VIEW COMPARISON:  Film from earlier in the same day. FINDINGS: Left jugular central line is noted with the catheter tip at the cavoatrial junction. No pneumothorax is noted. Cardiac shadow is stable. Pacing device and postsurgical changes are again seen. The lungs are clear bilaterally. IMPRESSION: No pneumothorax following central line placement. No acute abnormality noted. Electronically Signed  By: Alcide Clever M.D.   On: 08/20/2022 20:16   US RENAL  Result Date: 08/20/2022 CLINICAL DATA:  Acute renal injury EXAM: RENAL / URINARY TRACT ULTRASOUND COMPLETE COMPARISON:  None Available. FINDINGS: Right Kidney: Renal measurements: 11.7 x 4.5 x 4.6 cm. = volume: 125 mL. Echogenicity within normal limits. No mass or hydronephrosis visualized. Left Kidney: Renal measurements: 12.1 x 5.2 x 4.5 cm. = volume: 148 mL. Echogenicity within normal limits. No mass or hydronephrosis visualized. Bladder: Decompressed Other: None. IMPRESSION: Unremarkable kidneys.  No obstructive changes are seen. Electronically Signed   By: Alcide Clever M.D.   On: 08/20/2022 19:59   DG Chest Port 1 View  Result Date: 08/20/2022 CLINICAL DATA:  82 year old male with history of chest pain. Diarrhea for the past 3 days. EXAM: PORTABLE CHEST 1 VIEW COMPARISON:  Chest x-ray 07/15/2021. FINDINGS: Lung volumes are slightly low. No consolidative airspace disease. No pleural effusions. No pneumothorax. No pulmonary  nodule or mass noted. Pulmonary vasculature and the cardiomediastinal silhouette are within normal limits. Atherosclerotic calcifications in the thoracic aorta. Left-sided pacemaker device in place with lead tips projecting over the expected location of the right atrium and right ventricle. Status post median sternotomy for CABG. IMPRESSION: 1. No radiographic evidence of acute cardiopulmonary disease. 2. Aortic atherosclerosis. Electronically Signed   By: Trudie Reed M.D.   On: 08/20/2022 12:22    Labs: BMET Recent Labs  Lab 08/20/22 1224 08/21/22 0009 08/21/22 0441 08/21/22 1211 08/22/22 0502  NA 134* 133* 131* 132* 128*  K 5.2* 5.3* 5.3* 4.7 4.1  CL 99 99 99 97* 91*  CO2 20* 21* 18* 22 24  GLUCOSE 105* 147* 156* 158* 105*  BUN 56* 62* 62* 65* 67*  CREATININE 3.78* 3.63* 3.55* 3.35* 2.93*  CALCIUM 8.6* 8.3* 8.3* 8.2* 8.4*   CBC Recent Labs  Lab 08/20/22 1224 08/21/22 0441 08/22/22 0502  WBC 11.8* 15.3* 13.1*  NEUTROABS 10.4*  --   --   HGB 12.3* 13.2 11.9*  HCT 38.1* 39.6 35.5*  MCV 96.2 95.2 93.7  PLT 114* 134* 96*    Medications:     aspirin EC  81 mg Oral Daily   atorvastatin  80 mg Oral QHS   carbamazepine  400 mg Oral BID   Chlorhexidine Gluconate Cloth  6 each Topical Daily   clopidogrel  75 mg Oral Daily   divalproex  250 mg Oral BID   pantoprazole  40 mg Oral QAC breakfast   potassium chloride  40 mEq Oral Once      Paulene Floor, MD 08/22/2022, 9:07 AM

## 2022-08-22 NOTE — Progress Notes (Addendum)
ANTICOAGULATION CONSULT NOTE   Pharmacy Consult for Heparin Indication: chest pain/ACS  Allergies  Allergen Reactions   Spironolactone Other (See Comments)    Hyperkalemia    Patient Measurements: Height: 6' (182.9 cm) Weight: 95.2 kg (209 lb 14.1 oz) IBW/kg (Calculated) : 77.6 HEPARIN DW (KG): 97.3   Vital Signs: Temp: 97.7 F (36.5 C) (08/11 2300) Temp Source: Oral (08/11 2300) BP: 104/89 (08/11 1930) Pulse Rate: 60 (08/12 0600)  Labs: Recent Labs    08/20/22 1155 08/20/22 1224 08/20/22 1224 08/21/22 0009 08/21/22 0441 08/21/22 1211 08/22/22 0502  HGB  --  12.3*   < >  --  13.2  --  11.9*  HCT  --  38.1*  --   --  39.6  --  35.5*  PLT  --  114*  --   --  134*  --  96*  APTT 28  --   --   --   --   --   --   HEPARINUNFRC  --   --   --  0.30 0.36  --  0.38  CREATININE  --  3.78*   < > 3.63* 3.55* 3.35* 2.93*  TROPONINIHS  --  13,275*  --   --   --  8,096*  --    < > = values in this interval not displayed.    Estimated Creatinine Clearance: 23.3 mL/min (A) (by C-G formula based on SCr of 2.93 mg/dL (H)).   Medical History: Past Medical History:  Diagnosis Date   Allergic rhinitis    Arthritis    Asthma    Childhood   Atrial fibrillation (HCC)    Remote history - WARCEF   Bladder cancer (HCC)    BPH (benign prostatic hyperplasia)    Cardiomyopathy (HCC)    CHF (congestive heart failure) (HCC)    a. EF 30-35% by echo in 2018 and 10/2018, at 25-30% in 05/2020   Coronary atherosclerosis of native coronary artery    Multivessel s/p CABG in 2002 post-IMI, LVEF 35% up to 50% postoperatively;   Depression    Difficulty sleeping    Erectile dysfunction    Essential hypertension    Frequency of urination    History of bipolar disorder    Hyperlipidemia    IBS (irritable bowel syndrome)    Myocardial infarction (HCC) 2002    Medications:  See med rec  Assessment: 82 y.o. male.  He has a history of cardiac disease, cardiomyopathy, pacemaker, chronic  constipation and diarrhea, dysphagia.  He said he has had on and off diarrhea for the last few days. Patient had chest pain yesterday. Not on oral anticoagulants. MD asked pharmacy to start heparin without bolus. Followed by cardiology in OP clinic.  Heparin level came back therapeutic at 0.38, on 1200 units/hr. Hgb 11.9, plt 96. No s/sx of bleeding or infusion issues.  Goal of Therapy:  Heparin level 0.3-0.7 units/ml Monitor platelets by anticoagulation protocol: Yes   Plan:  Continue heparin infusion at 1200 units/hr until 8/12@1500  for 48 hr hours total  Thank you for allowing pharmacy to participate in this patient's care,  Sherron Monday, PharmD, BCCCP Clinical Pharmacist  Phone: [phone] 08/22/2022 7:25 AM  Please check AMION for all [facility] phone numbers After 10:00 PM, call Main Pharmacy [phone]

## 2022-08-22 NOTE — Progress Notes (Signed)
NAME:  Grant Stevens, MRN:  [id number], DOB:  1940-05-15, LOS: 2 ADMISSION DATE:  08/20/2022, CONSULTATION DATE:  08/20/2022 REFERRING MD:  Dr. Charm Barges, ER, CHIEF COMPLAINT:  Chest pain   History of Present Illness:  82 yo male former smoker with hx of CAD s/p CABG, ischemic CM with HFrEF (EF 25%), and conduction system disease s/p PM presented to [facility] ER after developing diarrhea on 08/19/22.  He had chest pain about 3 days prior to admission.  He described this as mid-sternal, lasting about 2 hours, and associated with dyspnea and diaphoresis.  He said it felt like his chest pain before his heart attack several years ago.  He didn't take any nitroglycerin.  The pain and dyspnea eventually resolve.  In the ER he was found to have AKI, mild hyperkalemia, significant elevation in troponin, mild lactic acidosis, hypotension.  Started on heparin and levophed gtt.  Transferred to [facility] for further cardiac management and PCCM consulted to admit to ICU.  Pertinent  Medical History  Allergies, Arthritis, A fib, Bladder cancer, BPH, HFrEF, CAD s/p CABG, Depression, ED, HTN, Bipolar, HLD, IBS, s/p PM  Significant Hospital Events: Including procedures, antibiotic start and stop dates in addition to other pertinent events   8/10 transfer from [facility] ER to [facility], start heparin and levophed gtt, cardiology consulted  Interim History / Subjective:  Patient stated feeling well No complaints Coox 63% 3.5L off x 24 hours with lasix, metolazone Remains of levo, milrinone.   Objective   Blood pressure 104/89, pulse 60, temperature 97.7 F (36.5 C), temperature source Oral, resp. rate 16, height 6' (1.829 m), weight 95.2 kg, SpO2 95%. CVP:  [7 mmHg-44 mmHg] 7 mmHg      Intake/Output Summary (Last 24 hours) at 08/22/2022 0747 Last data filed at 08/22/2022 0700 Gross per 24 hour  Intake 2082.8 ml  Output 5340 ml  Net -3257.2 ml   Filed Weights   08/20/22 1135 08/21/22 0600 08/22/22 0500  Weight: 98 kg 97.4 kg  95.2 kg    Examination: General: Elderly male in NAD HEENT: [place]/AT, PERRL, no JVD Neuro: Awake, alert, oriented.  Chest: Coarse bilateral breath sounds Heart: RRR, no MRG Abdomen: Soft, NT, ND Skin: Grossly intact  Labs and images were reviewed Echo with LVEF < 20%, regional wall motion abnormalities, grade 3 DD Na 128, Cr 2.93 improving, WBC 13  Resolved Hospital Problem list   Lactic acidosis.   Assessment & Plan:  NSTEMI with Hx Coronary artery disease status post CABG Acute on chronic HFrEF with cardiogenic shock Sustained V. Tach: improved with bolus dose amio and lido.  Hypertension/hyperlipidemia Atrial fibrillation history - Heart failure service following.  - Continue aspirin, Plavix. Heparin infusion to stop today s/p 48 hrs treatement.  - On eliquis in the past, seems like this has been stopped within the past year. Falls? He does not recall - why. Pharmacy team will complete med rec to learn more.  - Continue Levophed, wean off if able to keep SBP > - Milrinone continue - Diuresis per HF includes high dose lasix and metolazone.  - Hold zebeta, jardiance, entresto, lasix - Continue amiodarone infusion  AKI due to ischemic ATN in the setting of shock Baseline creatinine 1.11 from 02/28/22 Mild hyperkalemia. Acute metabolic acidosis BPH, Bladder cancer. - Creatinine improving with diuresis - Appreciate nephrology. Hope to avoid RRT.  - Monitor intake and output  Intermittent diarrhea with hx of IBS. Hemo-occult positive stool >> performed in ER. GERD. -  Diarrhea has improved - Continue Protonix - Monitor H&H  Chronic thrombocytopenia - Monitoring  Bipolar disease. - Continue tegretol, temazepam, depakote  Deconditioning - PT consult pending  Code status: Confirmed DNR/DNI with patient. Otherwise ok with all other interventions. Code status updated in EMR   Best Practice (right click and "Reselect all SmartList Selections" daily)    Diet/type: Regular consistency (see orders) DVT prophylaxis: systemic heparin GI prophylaxis: PPI Lines: N/A Foley:  N/A Code Status:  full code Last date of multidisciplinary goals of care discussion [8/11: Patient was updated at bedside]    Critical care time 43 minutes   Joneen Roach, AGACNP-BC [facility]  See Amion for personal pager PCCM on call pager [phone] until 7pm. Please call Elink 7p-7a. [phone]  08/22/2022 7:59 AM

## 2022-08-22 NOTE — TOC Initial Note (Signed)
Transition of Care [facility]) - Initial/Assessment Note    Patient Details  Name: Grant Stevens MRN: [id number] Date of Birth: 14-Jan-1940  [facility] CM/SW Contact:    Nicanor Bake Phone Number: [phone] 08/22/2022, 1:06 PM  Clinical Narrative:  CSW met witht he pt at bedside. CSW built rapport with pt. Pt stated that the only equipment that he uses at home is cane. In the pts room there was a walker present but he stated that he was just using that here. CSW asked the pt about a history of HH services pt stated no. Pt stated that almost 20 years ago he did have a heart attack. Pt stated that he currently lives alone. His wife is deceased, and he has a girlfriend. Pt  stated that his girlfriend check on him from time to time at his home. Pt stated that he is independent and had cleaning services come once a month to help him out but other than that he likes to do things for himself. CSW stated that she would schedule a follow up hospital appointment with his PCP at a later time closer to dc. TOC will continue to follow.                  Expected Discharge Plan: Home/Self Care Barriers to Discharge: Continued Medical Work up   Patient Goals and CMS Choice Patient states their goals for this hospitalization and ongoing recovery are:: return home          Expected Discharge Plan and Services       Living arrangements for the past 2 months: Single Family Home                                      Prior Living Arrangements/Services Living arrangements for the past 2 months: Single Family Home Lives with:: Self Patient language and need for interpreter reviewed:: Yes Do you feel safe going back to the place where you live?: Yes      Need for Family Participation in Patient Care: No (Comment) Care giver support system in place?: Yes (comment)   Criminal Activity/Legal Involvement Pertinent to Current Situation/Hospitalization: No - Comment as  needed  Activities of Daily Living      Permission Sought/Granted                  Emotional Assessment Appearance:: Appears stated age Attitude/Demeanor/Rapport: Engaged Affect (typically observed): Appropriate Orientation: : Oriented to Self, Oriented to Place, Oriented to  Time, Oriented to Situation   Psych Involvement: No (comment)  Admission diagnosis:  NSTEMI (non-ST elevated myocardial infarction) [facility]) [I21.4] Patient Active Problem List   Diagnosis Date Noted   Cardiogenic shock (HCC) 08/22/2022   NSTEMI (non-ST elevated myocardial infarction) (HCC) 08/20/2022   Hypotension 08/20/2022   Acute metabolic encephalopathy 07/15/2021   Dehydration 07/15/2021   Fall at home, initial encounter 07/15/2021   Tick bite 07/15/2021   Hoarseness of voice 07/08/2021   Dysphagia 07/08/2021   AKI (acute kidney injury) (HCC) 07/02/2021   Constipation 02/18/2021   Intermittent diarrhea 08/17/2020   Barrett's esophagus without dysplasia 08/17/2020   GERD (gastroesophageal reflux disease) 08/17/2020   Heart block AV complete (HCC) 05/12/2020   CAD (coronary artery disease) of artery bypass graft 05/12/2020   Syncope and collapse 05/12/2020   BPH (benign prostatic hyperplasia) 01/26/2015   Dizziness 10/19/2014   Coronary atherosclerosis of  native coronary artery    History of atrial fibrillation    Essential hypertension, benign 09/22/2010   Mixed hyperlipidemia 11/28/2008   SLEEP APNEA 11/28/2008   PCP:  Carylon Perches, MD Pharmacy:   [facility] [phone] - [place], [place] - [address] [place] [address] Phone: [phone] Fax: [phone]     Social Determinants of Health (SDOH) Social History: SDOH Screenings   Tobacco Use: Medium Risk (08/20/2022)   SDOH Interventions:     Readmission Risk Interventions     No data to display

## 2022-08-22 NOTE — Evaluation (Signed)
Physical Therapy Evaluation Patient Details Name: Grant Stevens MRN: [id number] DOB: 22-May-1940 Today's Date: 08/22/2022  History of Present Illness  82 y.o. male presents to [facility] hospital on 08/20/2022 as a transfer from [facility] with diarrhea and recent chest pain. Pt found to have AKI and NSTEMI. PMH includes OA, afib, bladder CA, BPH, HFrEF, CABG, depression, HTN, bipolar, HLD.  Clinical Impression  Pt presents to PT with deficits in gait, balance, functional mobility, endurance, strength, power. Pt with multiple losses of balance during session, especially with fatigue. Pt reports limited assistance, primarily from his significant other who is 82 years old. Pt will benefit from short term inpatient PT services at the time of discharge to allow for increased time to restore independence and reduce risk for falls.        If plan is discharge home, recommend the following: A little help with walking and/or transfers;A little help with bathing/dressing/bathroom;Assistance with cooking/housework;Assist for transportation;Help with stairs or ramp for entrance   Can travel by private vehicle   Yes    Equipment Recommendations Rolling walker (2 wheels);BSC/3in1  Recommendations for Other Services       Functional Status Assessment Patient has had a recent decline in their functional status and demonstrates the ability to make significant improvements in function in a reasonable and predictable amount of time.     Precautions / Restrictions Precautions Precautions: Fall Precaution Comments: A-line Restrictions Weight Bearing Restrictions: No      Mobility  Bed Mobility                    Transfers Overall transfer level: Needs assistance Equipment used: Rolling walker (2 wheels) Transfers: Sit to/from Stand Sit to Stand: Contact guard assist                Ambulation/Gait Ambulation/Gait assistance: Min assist Gait Distance (Feet): 150 Feet Assistive device:  Rolling walker (2 wheels) Gait Pattern/deviations: Step-through pattern Gait velocity: reduced Gait velocity interpretation: 1.31 - 2.62 ft/sec, indicative of limited community ambulator   General Gait Details: pt with posterior loss of balance and increased lateral drift with fatigue  Stairs            Wheelchair Mobility     Tilt Bed    Modified Rankin (Stroke Patients Only)       Balance Overall balance assessment: Needs assistance Sitting-balance support: No upper extremity supported, Feet supported Sitting balance-Leahy Scale: Good     Standing balance support: Bilateral upper extremity supported, Reliant on assistive device for balance Standing balance-Leahy Scale: Poor                               Pertinent Vitals/Pain Pain Assessment Pain Assessment: No/denies pain    Home Living Family/patient expects to be discharged to:: Private residence Living Arrangements: Alone Available Help at Discharge: Family;Friend(s);Available PRN/intermittently Type of Home: House Home Access: Stairs to enter Entrance Stairs-Rails: Can reach both Entrance Stairs-Number of Steps: 2   Home Layout: One level Home Equipment: Cane - quad;Cane - single point;Grab bars - toilet;Grab bars - tub/shower      Prior Function Prior Level of Function : Independent/Modified Independent;Driving             Mobility Comments: ambulates with SPC, exercises 3x/week       Extremity/Trunk Assessment   Upper Extremity Assessment Upper Extremity Assessment: Generalized weakness    Lower Extremity Assessment Lower Extremity Assessment: Generalized  weakness    Cervical / Trunk Assessment Cervical / Trunk Assessment: Kyphotic  Communication   Communication Communication: No apparent difficulties Cueing Techniques: Verbal cues  Cognition Arousal: Alert Behavior During Therapy: WFL for tasks assessed/performed Overall Cognitive Status: Impaired/Different from  baseline Area of Impairment: Memory                     Memory: Decreased short-term memory         General Comments: pt frequently repeats the same information he had already told this PT        General Comments General comments (skin integrity, edema, etc.): VSS on RA    Exercises     Assessment/Plan    PT Assessment Patient needs continued PT services  PT Problem List Decreased strength;Decreased activity tolerance;Decreased balance;Decreased mobility;Decreased knowledge of use of DME       PT Treatment Interventions DME instruction;Gait training;Functional mobility training;Therapeutic activities;Stair training;Balance training;Neuromuscular re-education;Therapeutic exercise;Patient/family education    PT Goals (Current goals can be found in the Care Plan section)  Acute Rehab PT Goals Patient Stated Goal: to regain strength, return to independence PT Goal Formulation: With patient Time For Goal Achievement: 09/05/22 Potential to Achieve Goals: Good    Frequency Min 1X/week     Co-evaluation               AM-PAC PT "6 Clicks" Mobility  Outcome Measure Help needed turning from your back to your side while in a flat bed without using bedrails?: A Little Help needed moving from lying on your back to sitting on the side of a flat bed without using bedrails?: A Little Help needed moving to and from a bed to a chair (including a wheelchair)?: A Little Help needed standing up from a chair using your arms (e.g., wheelchair or bedside chair)?: A Little Help needed to walk in hospital room?: A Little Help needed climbing 3-5 steps with a railing? : Total 6 Click Score: 16    End of Session   Activity Tolerance: Patient limited by fatigue Patient left: in chair;with call bell/phone within reach;with nursing/sitter in room Nurse Communication: Mobility status PT Visit Diagnosis: Other abnormalities of gait and mobility (R26.89);Muscle weakness  (generalized) (M62.81)    Time: 5621-3086 PT Time Calculation (min) (ACUTE ONLY): 13 min   Charges:   PT Evaluation $PT Eval Low Complexity: 1 Low   PT General Charges $$ ACUTE PT VISIT: 1 Visit         Arlyss Gandy, PT, DPT Acute Rehabilitation Office [phone]   Arlyss Gandy 08/22/2022, 1:41 PM

## 2022-08-23 DIAGNOSIS — K219 Gastro-esophageal reflux disease without esophagitis: Secondary | ICD-10-CM | POA: Diagnosis not present

## 2022-08-23 DIAGNOSIS — I5023 Acute on chronic systolic (congestive) heart failure: Secondary | ICD-10-CM

## 2022-08-23 DIAGNOSIS — N179 Acute kidney failure, unspecified: Secondary | ICD-10-CM

## 2022-08-23 DIAGNOSIS — Z8679 Personal history of other diseases of the circulatory system: Secondary | ICD-10-CM | POA: Diagnosis not present

## 2022-08-23 DIAGNOSIS — I214 Non-ST elevation (NSTEMI) myocardial infarction: Secondary | ICD-10-CM | POA: Diagnosis not present

## 2022-08-23 DIAGNOSIS — I5022 Chronic systolic (congestive) heart failure: Secondary | ICD-10-CM

## 2022-08-23 DIAGNOSIS — R570 Cardiogenic shock: Secondary | ICD-10-CM | POA: Diagnosis not present

## 2022-08-23 LAB — BASIC METABOLIC PANEL
Anion gap: 14 (ref 5–15)
BUN: 65 mg/dL — ABNORMAL HIGH (ref 8–23)
CO2: 25 mmol/L (ref 22–32)
Calcium: 8.8 mg/dL — ABNORMAL LOW (ref 8.9–10.3)
Chloride: 88 mmol/L — ABNORMAL LOW (ref 98–111)
Creatinine, Ser: 2.37 mg/dL — ABNORMAL HIGH (ref 0.61–1.24)
GFR, Estimated: 27 mL/min — ABNORMAL LOW (ref >60)
Glucose, Bld: 100 mg/dL — ABNORMAL HIGH (ref 70–99)
Potassium: 4.3 mmol/L (ref 3.5–5.1)
Sodium: 127 mmol/L — ABNORMAL LOW (ref 135–145)

## 2022-08-23 LAB — CBC
HCT: 34.6 % — ABNORMAL LOW (ref 39.0–52.0)
Hemoglobin: 11.8 g/dL — ABNORMAL LOW (ref 13.0–17.0)
MCH: 30.5 pg (ref 26.0–34.0)
MCHC: 34.1 g/dL (ref 30.0–36.0)
MCV: 89.4 fL (ref 80.0–100.0)
Platelets: 105 10*3/uL — ABNORMAL LOW (ref 150–400)
RBC: 3.87 MIL/uL — ABNORMAL LOW (ref 4.22–5.81)
RDW: 13.4 % (ref 11.5–15.5)
WBC: 12.2 10*3/uL — ABNORMAL HIGH (ref 4.0–10.5)
nRBC: 0 % (ref 0.0–0.2)

## 2022-08-23 LAB — MAGNESIUM: Magnesium: 2.1 mg/dL (ref 1.7–2.4)

## 2022-08-23 MED ORDER — ORAL CARE MOUTH RINSE: 15.0000 mL | OROMUCOSAL | Status: DC | PRN

## 2022-08-23 MED ORDER — FUROSEMIDE 10 MG/ML IJ SOLN
120.0000 mg | Freq: Two times a day (BID) | INTRAVENOUS | Status: AC
  Administered 2022-08-23 (×2): 120 mg via INTRAVENOUS
  Filled 2022-08-23 (×2): qty 10

## 2022-08-23 MED ORDER — MILRINONE LACTATE IN DEXTROSE 20-5 MG/100ML-% IV SOLN
INTRAVENOUS | Status: AC
  Filled 2022-08-23: qty 100

## 2022-08-23 MED ORDER — POTASSIUM CHLORIDE CRYS ER 20 MEQ PO TBCR
20.0000 meq | EXTENDED_RELEASE_TABLET | Freq: Once | ORAL | Status: AC
  Administered 2022-08-23: 20 meq via ORAL
  Filled 2022-08-23: qty 1

## 2022-08-23 MED ORDER — TEMAZEPAM 15 MG PO CAPS
15.0000 mg | ORAL_CAPSULE | Freq: Every day | ORAL | Status: DC
  Administered 2022-08-23 – 2022-08-28 (×6): 15 mg via ORAL
  Filled 2022-08-23 (×6): qty 1

## 2022-08-23 MED ORDER — HEPARIN SODIUM (PORCINE) 5000 UNIT/ML IJ SOLN
5000.0000 [IU] | Freq: Three times a day (TID) | INTRAMUSCULAR | Status: DC
  Administered 2022-08-23 – 2022-08-26 (×9): 5000 [IU] via SUBCUTANEOUS
  Filled 2022-08-23 (×10): qty 1

## 2022-08-23 NOTE — Progress Notes (Signed)
Progress Note   Patient: Grant Stevens:[id number] DOB: 28-Sep-1940 DOA: 08/20/2022     3 DOS: the patient was seen and examined on 08/23/2022   Brief hospital course: [name] was admitted to the hospital with the working diagnosis of cardiogenic shock.  82 yo male with the past medical history of coronary artery disease, sp CABG, heart failure, and conduction system disease sp pacemaker who presented with diarrhea for 24 hrs. Endorsed intermittent chest pain for the last 3 days prior to admission, associated with dyspnea and diaphoresis. In the ED his blood pressure was 87/75, HR 77, rr 22 and 02 saturation 82%, lungs with no wheezing or reales, heart with S1 and S2 present and regular with no gallops, rubs or murmurs, abdomen with no distention and no lower extremity edema.  Na 134, K 5.2 Cl 99, bicarbonate 20, glucose 105 bun 56 cr 3,78  AST 38 ALT 66  High sensitive troponin 13,275, 8,096   Lactic acid 2,3  Wbc 11,8 hgb 12.3 plt 114  Urine analysis SG 1,006, negative protein, negative hgb, negative leukocytes.   Chest radiograph with cardiomegaly, bilateral hilar vascular congestion, bilateral atelectasis at bases, pacemaker in place with one right atrial and one right ventricular lead. Sternotomy wires in place.   #1 EKG 71 bpm, left axis deviation, interventricular conduction delay, qtc 621, ventricular paced rhythm with no significant ST segment or T wave changes.   #2 EKG 121 bpm, normal axis, right bundle branch block, qtc 626, monomorphic ventricular tachycardia, no significant ST segment or T wave changes.  #3 EKG 62 bpm, normal axis, interventricular conduction delay, qtc 533, ventricular paced rhythm, no significant  ST segment changes, negative T wave V5 and V6.   Patient was placed on norepinephrine infusion and heparin infusion.  Transferred from [facility] to [facility] ICU.   Echocardiogram with reduced LV systolic function, milrinone was added for cardiogenic  shock. Amiodarone drip for ventricular tachycardia.   Diuresis with furosemide and metolazone.  Not candidate for advance therapies.   08/12 off norepinephrine.  08/13 transfer to [facility].     Assessment and Plan: * NSTEMI (non-ST elevated myocardial infarction) [facility]) Patient now off heparin drip. No chest pain.   Continue medical therapy with aspirin, clopidogrel, and atorvastatin.  May need coronary angiography on this admission, when hemodynamically more stable.   Acute on chronic systolic CHF (congestive heart failure) (HCC) Echocardiogram with reduced LV systolic function with EF <20%, mild to moderate LV cavity dilatation, RV systolic function with severe reduction, RVSP 38,9 mmHg, no significant valvular disease.   LV aneurysmal at the apical lateral segment, apical segment, apical anterior segment, apical inferior segment.  Akinetic mid antero septal segment, inferolateral segment, mid antero-lateral segment, mid infero-septal segment, mid anterior segment, mid inferior segment. Hypokinetic basal antero septal, basal infero lateral segment, basal antero lateral segment, basal anterior segment, basal inferior segment and basal infero septal segment.    Cardiogenic shock.   Urine output is 4,775 ml  Systolic blood pressure 120 to 117 mmHg.  SV02 65   Plan to continue inotropic support with milrinone 0.25 mcg.  Furosemide 120 mg IV bid.   Ventricular tachycardia. Continue amiodarone drip.    AKI (acute kidney injury) (HCC) Hyponatremia, hyperkalemia.   Renal function with serum cr at 2,37 with K at 4,3 and serum bicarbonate at 25. Na 127 Mg 2.1   Plan to continue diuresis with furosemide Oral Kcl to prevent hypokalemia.  Follow up renal function and electrolytes in  am.   History of atrial fibrillation Currently on amiodarone for rate control. Not on anticoagulation.   Sick sinus syndrome, sp pacemaker implantation. Telemetry with ventricular paced rhythm.    GERD (gastroesophageal reflux disease) Continue pantoprazole.         Subjective: Patient with no chest pain or dyspnea, last night not able to sleep well.   Physical Exam: Vitals:   08/23/22 0500 08/23/22 0530 08/23/22 0600 08/23/22 0800  BP: (!) 117/100  120/69 111/67  Pulse: 66 67 66 70  Resp: 18 (!) 29 (!) 31 13  Temp:      TempSrc:      SpO2: 96% 96% 97% 95%  Weight:      Height:       Neurology awake and alert ENT with no pallor Cardiovascular with S1 and S2 present and regular with no gallops, rubs or murmurs No JVD No lower extremity edema Respiratory with mild rales at bases with no wheezing or rhonchi Abdomen with no distention   Data Reviewed:    Family Communication: no family at the bedside   Disposition: Status is: Inpatient Remains inpatient appropriate because: IV inotropic support   Planned Discharge Destination: Home    Author: Coralie Keens, MD 08/23/2022 9:53 AM  For on call review [email or web address].

## 2022-08-23 NOTE — Assessment & Plan Note (Signed)
Continue pantoprazole. °

## 2022-08-23 NOTE — Assessment & Plan Note (Addendum)
Currently on amiodarone for rate control. Not on anticoagulation.   Sick sinus syndrome, sp pacemaker implantation. Telemetry with ventricular paced rhythm.

## 2022-08-23 NOTE — Assessment & Plan Note (Addendum)
Echocardiogram with reduced LV systolic function with EF <20%, mild to moderate LV cavity dilatation, RV systolic function with severe reduction, RVSP 38,9 mmHg, no significant valvular disease.   LV aneurysmal at the apical lateral segment, apical segment, apical anterior segment, apical inferior segment.  Akinetic mid antero septal segment, inferolateral segment, mid antero-lateral segment, mid infero-septal segment, mid anterior segment, mid inferior segment. Hypokinetic basal antero septal, basal infero lateral segment, basal antero lateral segment, basal anterior segment, basal inferior segment and basal infero septal segment.    Cardiogenic shock.   Urine output is 4,775 ml  Systolic blood pressure 120 to 117 mmHg.  SV02 65   Plan to continue inotropic support with milrinone 0.25 mcg.  Furosemide 120 mg IV bid.   Ventricular tachycardia. Continue amiodarone drip.

## 2022-08-23 NOTE — Assessment & Plan Note (Signed)
Patient now off heparin drip. No chest pain.   Continue medical therapy with aspirin, clopidogrel, and atorvastatin.  May need coronary angiography on this admission, when hemodynamically more stable.

## 2022-08-23 NOTE — Evaluation (Signed)
Occupational Therapy Evaluation Patient Details Name: Grant Stevens MRN: [id number] DOB: 1940-03-21 Today's Date: 08/23/2022   History of Present Illness 82 y.o. male presents to [facility] hospital on 08/20/2022 as a transfer from [facility] with diarrhea and recent chest pain. Pt found to have AKI and NSTEMI. PMH includes OA, afib, bladder CA, BPH, HFrEF, CABG, depression, HTN, bipolar, HLD.   Clinical Impression   Patient admitted for the diagnosis above.  PTA he lives alone, but has a SO and a niece that are able to assist on a PRN basis.  Patient is Ind with ADL and mobility at home.  Currently his deficits are generalized weakness, decreased activity tolerance, and poor dynamic balance.  The patient is needing up to Min A for basic transfers and ADL completion from a sit to stand level.  Patient needs to be Mod I at home, as he does not have the needed 24 hour assist, so the Patient will benefit from continued inpatient follow up therapy, <3 hours/day.  OT will follow in the acute setting to address deficits.          If plan is discharge home, recommend the following: Assist for transportation;Assistance with cooking/housework;A little help with bathing/dressing/bathroom;A little help with walking and/or transfers    Functional Status Assessment  Patient has had a recent decline in their functional status and demonstrates the ability to make significant improvements in function in a reasonable and predictable amount of time.  Equipment Recommendations  None recommended by OT    Recommendations for Other Services       Precautions / Restrictions Precautions Precautions: Fall Precaution Comments: A-line Restrictions Weight Bearing Restrictions: No      Mobility Bed Mobility Overal bed mobility: Needs Assistance Bed Mobility: Sit to Supine       Sit to supine: Supervision        Transfers Overall transfer level: Needs assistance Equipment used: Rolling walker (2  wheels) Transfers: Sit to/from Stand Sit to Stand: Supervision                  Balance Overall balance assessment: Needs assistance Sitting-balance support: No upper extremity supported, Feet supported Sitting balance-Leahy Scale: Good     Standing balance support: Reliant on assistive device for balance Standing balance-Leahy Scale: Poor                             ADL either performed or assessed with clinical judgement   ADL Overall ADL's : Needs assistance/impaired Eating/Feeding: Independent;Sitting   Grooming: Supervision/safety;Standing   Upper Body Bathing: Supervision/ safety;Sitting   Lower Body Bathing: Minimal assistance;Sit to/from stand   Upper Body Dressing : Supervision/safety;Sitting   Lower Body Dressing: Minimal assistance;Sit to/from stand   Toilet Transfer: Minimal assistance;Regular Toilet;Rolling walker (2 wheels);Ambulation                   Vision Baseline Vision/History: 1 Wears glasses Patient Visual Report: No change from baseline       Perception Perception: Within Functional Limits       Praxis Praxis: WFL       Pertinent Vitals/Pain Pain Assessment Pain Assessment: No/denies pain     Extremity/Trunk Assessment Upper Extremity Assessment Upper Extremity Assessment: Generalized weakness   Lower Extremity Assessment Lower Extremity Assessment: Defer to PT evaluation   Cervical / Trunk Assessment Cervical / Trunk Assessment: Kyphotic   Communication Communication Communication: No apparent difficulties   Cognition Arousal:  Alert Behavior During Therapy: WFL for tasks assessed/performed Overall Cognitive Status: Within Functional Limits for tasks assessed                                       General Comments   HR to 104 with mobility    Exercises     Shoulder Instructions      Home Living Family/patient expects to be discharged to:: Private residence Living Arrangements:  Alone Available Help at Discharge: Family;Friend(s);Available PRN/intermittently Type of Home: House Home Access: Stairs to enter [employer] of Steps: 2 Entrance Stairs-Rails: Can reach both Home Layout: One level     Bathroom Shower/Tub: Tub/shower unit;Walk-in shower   Bathroom Toilet: Standard Bathroom Accessibility: Yes How Accessible: Accessible via walker Home Equipment: Cane - quad;Cane - single point;Grab bars - toilet;Grab bars - tub/shower          Prior Functioning/Environment Prior Level of Function : Independent/Modified Independent;Driving             Mobility Comments: ambulates with SPC, exercises 3x/week ADLs Comments: Independent        OT Problem List: Decreased strength;Decreased activity tolerance;Impaired balance (sitting and/or standing)      OT Treatment/Interventions: Self-care/ADL training;Therapeutic exercise;Therapeutic activities;Patient/family education;Balance training    OT Goals(Current goals can be found in the care plan section) Acute Rehab OT Goals Patient Stated Goal: Return home OT Goal Formulation: With patient Time For Goal Achievement: 09/06/22 Potential to Achieve Goals: Good ADL Goals Pt Will Perform Grooming: with modified independence;standing Pt Will Perform Lower Body Dressing: with modified independence;sit to/from stand Pt Will Transfer to Toilet: with modified independence;regular height toilet;ambulating Pt/caregiver will Perform Home Exercise Program: Increased strength;Both right and left upper extremity;With theraband;With Supervision  OT Frequency: Min 1X/week    Co-evaluation              AM-PAC OT "6 Clicks" Daily Activity     Outcome Measure Help from another person eating meals?: None Help from another person taking care of personal grooming?: A Little Help from another person toileting, which includes using toliet, bedpan, or urinal?: A Little Help from another person bathing  (including washing, rinsing, drying)?: A Little Help from another person to put on and taking off regular upper body clothing?: None Help from another person to put on and taking off regular lower body clothing?: A Little 6 Click Score: 20   End of Session Equipment Utilized During Treatment: Gait belt;Rolling walker (2 wheels) Nurse Communication: Mobility status  Activity Tolerance: Patient tolerated treatment well Patient left: in bed;with call bell/phone within reach  OT Visit Diagnosis: Unsteadiness on feet (R26.81);Muscle weakness (generalized) (M62.81)                Time: 1610-9604 OT Time Calculation (min): 22 min Charges:  OT General Charges $OT Visit: 1 Visit OT Evaluation $OT Eval Moderate Complexity: 1 Mod  08/23/2022  RP, OTR/L  Acute Rehabilitation Services  Office:  [phone]   Suzanna Obey 08/23/2022, 11:11 AM

## 2022-08-23 NOTE — Progress Notes (Signed)
Advanced Heart Failure Rounding Note  PCP-Cardiologist: Nona Dell, MD   Subjective:    Remains on milrinone 0.25. Co-ox 65% Diuresed 4.8L CVP 7-8  Denies CP or SOB. Walked halls with walker yesterday   Scr continues to improve 2.49 -> 2.37    Objective:   Weight Range: 95.2 kg Body mass index is 28.46 kg/m.   Vital Signs:   Temp:  [97.9 F (36.6 C)-98.1 F (36.7 C)] 98.1 F (36.7 C) (08/12 2300) Pulse Rate:  [60-75] 70 (08/13 0800) Resp:  [13-32] 13 (08/13 0800) BP: (90-121)/(60-100) 111/67 (08/13 0800) SpO2:  [87 %-99 %] 95 % (08/13 0800) Last BM Date : 08/22/22  Weight change: Filed Weights   08/20/22 1135 08/21/22 0600 08/22/22 0500  Weight: 98 kg 97.4 kg 95.2 kg    Intake/Output:   Intake/Output Summary (Last 24 hours) at 08/23/2022 0917 Last data filed at 08/23/2022 0800 Gross per 24 hour  Intake 1201.58 ml  Output 4175 ml  Net -2973.42 ml      Physical Exam    General:  Sitting up in bed. No resp difficulty HEENT: normal Neck: supple. JVP 8. Carotids 2+ bilat; no bruits. No lymphadenopathy or thryomegaly appreciated. Cor: Regular rate & rhythm. No rubs, gallops or murmurs. Lungs: clear Abdomen: soft, nontender, nondistended. No hepatosplenomegaly. No bruits or masses. Good bowel sounds. Extremities: no cyanosis, clubbing, rash, tr-1+ edema Neuro: alert & orientedx3, cranial nerves grossly intact. moves all 4 extremities w/o difficulty. Affect pleasant    Telemetry   AV paced, 60s No VT Personally reviewed  Labs    CBC Recent Labs    08/20/22 1224 08/21/22 0441 08/22/22 2328 08/23/22 0445  WBC 11.8*   < > 12.7* 12.2*  NEUTROABS 10.4*  --   --   --   HGB 12.3*   < > 11.7* 11.8*  HCT 38.1*   < > 34.6* 34.6*  MCV 96.2   < > 90.1 89.4  PLT 114*   < > 107* 105*   < > = values in this interval not displayed.   Basic Metabolic Panel Recent Labs    82/95/62 2328 08/23/22 0445  NA 126* 127*  K 4.9 4.3  CL 87* 88*  CO2 29  25  GLUCOSE 101* 100*  BUN 66* 65*  CREATININE 2.49* 2.37*  CALCIUM 8.8* 8.8*  MG 2.1 2.1  PHOS 4.2  --    Liver Function Tests Recent Labs    08/20/22 1224  AST 66*  ALT 40  ALKPHOS 66  BILITOT 0.7  PROT 7.2  ALBUMIN 4.2   Recent Labs    08/20/22 1224  LIPASE 38   Cardiac Enzymes No results for input(s): "CKTOTAL", "CKMB", "CKMBINDEX", "TROPONINI" in the last 72 hours.  BNP: BNP (last 3 results) Recent Labs    08/21/22 0441  BNP 2,520.5*    ProBNP (last 3 results) No results for input(s): "PROBNP" in the last 8760 hours.   D-Dimer No results for input(s): "DDIMER" in the last 72 hours. Hemoglobin A1C Recent Labs    08/22/22 0502  HGBA1C 6.0*   Fasting Lipid Panel Recent Labs    08/22/22 0502  CHOL 132  HDL 50  LDLCALC 73  TRIG 45  CHOLHDL 2.6   Thyroid Function Tests Recent Labs    08/22/22 0502  TSH 1.499    Other results:   Imaging    No results found.   Medications:     Scheduled Medications:  aspirin EC  81  mg Oral Daily   atorvastatin  80 mg Oral QHS   carbamazepine  400 mg Oral BID   Chlorhexidine Gluconate Cloth  6 each Topical Daily   clopidogrel  75 mg Oral Daily   divalproex  250 mg Oral BID   pantoprazole  40 mg Oral QAC breakfast    Infusions:  sodium chloride Stopped (08/20/22 1740)   sodium chloride     amiodarone 30 mg/hr (08/23/22 0800)   milrinone 0.25 mcg/kg/min (08/23/22 0800)    PRN Medications: Place/Maintain arterial line **AND** sodium chloride, temazepam   Assessment/Plan    1. Acute on chronic systolic HF -> cardiogenic shock - In setting of OOH infarct - Echo 2/24 EF 25-30% RV mildly reduced - Echo 08/21/22: EF < 20% RV severely reduced - Initial co-ox 31% - Currently on milrinone 0.25 with CO-OX 65%. Off NE - Off GDMT due to shock, AKI - Diuresing well. CVP 7-8. Decreased lasix to 120 bid - Not candidate for advanced therapies with age - Eventually will need coronary angio if/when  SCr improves. Can also consider cMRI - TED hose   2. CAD with possible NSTEMI - h/o CAD s/p CABG  - suspect OOH infarct with loss of SVG - had heparin x 48 hours - DAPT/statin  - Eventually will need coronary angio if/when SCr improves.    3. Ventricular tachycardia - No recurrence overnight - continue amio gtt while on milrinone - lidocaine off - Keep K > 4.0 Mg > 2.0   4. AKI - due to shock/ATN - baseline Scr 1.1 , peaked at 3.8. Now trending down  - continue hemodynamic support - Renal following - I discussed witht hem at bedside   5. SSS - s/p pacer  6. Remote history of atrial fibrillation - Has not been anticoagulated - No recurrence this admission. Would need anticoagulation if recurs.  7. Hyponatremia - restrict FW - can use tolvapatan if < 125   8. Limited DNR - Spoke with him in detail about GOC wishes - Ok with intubation and defibrillation  - Would not want CPR  Out of bed today. Can go to [facility]    Length of Stay: 3  Arvilla Meres, MD  08/23/2022, 9:17 AM  Advanced Heart Failure Team Pager [phone] (M-F; 7a - 5p)  Please contact [facility] for night-coverage after hours (5p -7a ) and weekends on [email or web address]

## 2022-08-23 NOTE — Assessment & Plan Note (Signed)
Hyponatremia, hyperkalemia.   Renal function with serum cr at 2,37 with K at 4,3 and serum bicarbonate at 25. Na 127 Mg 2.1   Plan to continue diuresis with furosemide Oral Kcl to prevent hypokalemia.  Follow up renal function and electrolytes in am.

## 2022-08-23 NOTE — Progress Notes (Signed)
[facility] Progress Note   82 y.o. year-old w/ PMH  NSTEMI w/ Hx of CAD sp CABG, ischemic CM w/ EF 25%, sp PPM, atrial fib, bladder cancer, bipolar d/o. Cardiogenic shock w/ high LA and hypotension, mild ^K+ and AKI.  Pt was started on IV heparin and levophed gtt + milrinone + high dose diuretics. No contrast.    Assessment/ Plan:   AKI on CKD 3a - b/l creatinine 1.1- 1.3 from feb 2024, eGFR 56- >60 ml/min. Creat here was 3.7 in the setting of chest pain and NSTEMI w/ associated cardiogenic shock. Had been getting milrinone and levophed support w/ good UOP + high dose diuretics. - renal US w/o obstruction. AKI likely ATN and/or hemodynamic due to cardiogenic shock associated w/ nstemi.   - CO-OX 65% and cr continues to trend downwards. 5.3L/4.8L UOP /48 hrs and high dose IV lasix and zaroxolyn decreased 8/12.   CVP7-8 this AM; d/w primary team and they will give Samsca if Na drops below 125.   Signing off at this time; please reconsult as needed.  CAD - suspected NSTEMI CAD hx of CABG ICM EF 25-30% VT - on IV amio  Subjective:   Feeling better; denies CP, breathing more comfortable.  Denies f/c/n/v.   CO-OX 65% on milrinone, levo off now. CVP 7-8 with great UOP     Objective:   BP 111/67 (BP Location: Left Arm)   Pulse 70   Temp 98.1 F (36.7 C) (Oral)   Resp 13   Ht 6' (1.829 m)   Wt 95.2 kg   SpO2 95%   BMI 28.46 kg/m   Intake/Output Summary (Last 24 hours) at 08/23/2022 0920 Last data filed at 08/23/2022 0800 Gross per 24 hour  Intake 1201.58 ml  Output 4175 ml  Net -2973.42 ml   Weight change:   Physical Exam: Gen alert, no distress No rash, cyanosis or gangrene Chest clear bilat to bases, no rales/ wheezing RRR no MRG Abd SNDNT+BS Ext tr LE edema Neuro is alert, Ox 3 , nf Triple lumen in the neck  Imaging: No results found.  Labs: BMET Recent Labs  Lab 08/21/22 0009 08/21/22 0441 08/21/22 1211 08/22/22 0502 08/22/22 1325  08/22/22 2328 08/23/22 0445  NA 133* 131* 132* 128* 128* 126* 127*  K 5.3* 5.3* 4.7 4.1 4.9 4.9 4.3  CL 99 99 97* 91* 91* 87* 88*  CO2 21* 18* 22 24 23 29 25   GLUCOSE 147* 156* 158* 105* 118* 101* 100*  BUN 62* 62* 65* 67* 68* 66* 65*  CREATININE 3.63* 3.55* 3.35* 2.93* 2.75* 2.49* 2.37*  CALCIUM 8.3* 8.3* 8.2* 8.4* 8.6* 8.8* 8.8*  PHOS  --   --   --   --   --  4.2  --    CBC Recent Labs  Lab 08/20/22 1224 08/21/22 0441 08/22/22 0502 08/22/22 2328 08/23/22 0445  WBC 11.8* 15.3* 13.1* 12.7* 12.2*  NEUTROABS 10.4*  --   --   --   --   HGB 12.3* 13.2 11.9* 11.7* 11.8*  HCT 38.1* 39.6 35.5* 34.6* 34.6*  MCV 96.2 95.2 93.7 90.1 89.4  PLT 114* 134* 96* 107* 105*    Medications:     aspirin EC  81 mg Oral Daily   atorvastatin  80 mg Oral QHS   carbamazepine  400 mg Oral BID   Chlorhexidine Gluconate Cloth  6 each Topical Daily   clopidogrel  75 mg Oral Daily   divalproex  250 mg Oral  BID   pantoprazole  40 mg Oral QAC breakfast      Paulene Floor, MD 08/23/2022, 9:20 AM

## 2022-08-23 NOTE — Hospital Course (Addendum)
[name] was admitted to the hospital with the working diagnosis of cardiogenic shock.  82 yo male with the past medical history of coronary artery disease, sp CABG, heart failure, and conduction system disease sp pacemaker who presented with diarrhea for 24 hrs. Endorsed intermittent chest pain for the last 3 days prior to admission, associated with dyspnea and diaphoresis. In the ED his blood pressure was 87/75, HR 77, rr 22 and 02 saturation 82%, lungs with no wheezing or reales, heart with S1 and S2 present and regular with no gallops, rubs or murmurs, abdomen with no distention and no lower extremity edema.  Na 134, K 5.2 Cl 99, bicarbonate 20, glucose 105 bun 56 cr 3,78  AST 38 ALT 66  High sensitive troponin 13,275, 8,096   Lactic acid 2,3  Wbc 11,8 hgb 12.3 plt 114  Urine analysis SG 1,006, negative protein, negative hgb, negative leukocytes.   Chest radiograph with cardiomegaly, bilateral hilar vascular congestion, bilateral atelectasis at bases, pacemaker in place with one right atrial and one right ventricular lead. Sternotomy wires in place.   #1 EKG 71 bpm, left axis deviation, interventricular conduction delay, qtc 621, ventricular paced rhythm with no significant ST segment or T wave changes.   #2 EKG 121 bpm, normal axis, right bundle branch block, qtc 626, monomorphic ventricular tachycardia, no significant ST segment or T wave changes.  #3 EKG 62 bpm, normal axis, interventricular conduction delay, qtc 533, ventricular paced rhythm, no significant  ST segment changes, negative T wave V5 and V6.   Patient was placed on norepinephrine infusion and heparin infusion.  Transferred from [facility] ED to [facility] ICU.   Echocardiogram with reduced LV systolic function, milrinone was added for cardiogenic shock. Amiodarone drip for ventricular tachycardia.   Diuresis with furosemide and metolazone.  Not candidate for advance therapies.   08/12 off norepinephrine.  08/13 transfer to [facility]    **Interim History  Cath done and showed patent LIMA-LAD and SVG-RCA, occluded SVG-diagonal with Medical Management recommended. Subsequently he has had recurrent ventricular tachycardia and cardiology has now reinitiated amiodarone and started him on a lidocaine drip.  He subsequently converted back to normal sinus rhythm without shock but cardiology does not try a dose of tolvaptan holding diuresis today.  Assessment and Plan:  Acute on Chronic systolic CHF with cardiogenic shock -Advanced heart failure team following -TTE 8/11: LVEF <20% with severely reduced RV -Diuresed with IV Lasix and milrinone, now off milrinone -10 L since admission and weight down by about 15 pounds.  Intake/Output Summary (Last 24 hours) at 08/28/2022 1558 Last data filed at 08/28/2022 1500 Gross per 24 hour  Intake 1541.88 ml  Output 625 ml  Net 916.88 ml  -Off GDMT due to shock and acute kidney injury.  - Not a candidate for advanced therapies per cardiology. -Initially plans to defer cath, subsequently had sustained V. tach, LHC yesterday noted patent LIMA to LAD, SVG to PDA but occluded SVG to diagonal, normal filling pressures, medical management recommended -Patient had a CVP of 10-11 today and the advanced heart failure team recommending not to aggressively diurese given that he had a trickle tachycardia -Cardiology they worry that he may be at the end of stage situation and feel that a left bundle lead may help him but he needs to be stabilized from a rhythm standpoint further given his recurrent V. Tach -The cardiology team still discussed with his primary EP physician Dr. Ladona Ridgel on Monday   CAD with NSTEMI -History of  CAD s/p CABG -Cardiology suspects that he had an outside of the hospital infarct with loss of SVG-D - opponent's were elevated and greater than 13,000 -Completed heparin x 48 hours. -Continue DAPT with Aspirin 81 mg po daily and Clopidogrel 75 mg po Daily -LHC showed patent LIMA-LAD  and SVG-RCA, occluded SVG-diagonal. -Medical management recommended and cardiology added ranolazine 500 g twice daily for antiarrhythmic and anti-ischemic properties   Ventricular Tachycardia -Recurrent episode 8/16, 10 minutes of sustained V. Tach and had another sustained ventricular tachycardia -Amio gtt Restarted being continued at 60 mg/h and now receiving a lidocaine drip -Further care per Cardiology Teams and ranolazine has been added for an antiarrhythmic and active ischemic property -Per cardiology the patient does not want an ICD and he is limited DNR   Hyperkalemia: -Secondary to AKI.  Resolved. -K+ Trend: Recent Labs  Lab 08/26/22 0428 08/26/22 1534 08/26/22 1541 08/27/22 0042 08/27/22 0936 08/27/22 1652 08/28/22 0455  K 3.8 3.9 4.3  4.3 4.4 4.3 4.5 3.8  -Continue to Monitor and Trend and repeat CMP in the AM   Chronic Right Knee Pain: -Suspect osteoarthritis.  No history of trauma or gout. -Continue with as needed acetaminophen 650 mg every 6 as needed for mild to moderate pain and topical Voltaren gel.   Hyponatremia -Multifactorial due to decompensated CHF, diuretics, AKI etc. -Down to 121 a few days ago, plan for tolvaptan -Na+ Trend: Recent Labs  Lab 08/26/22 0428 08/26/22 1534 08/26/22 1541 08/27/22 0042 08/27/22 0936 08/27/22 1652 08/28/22 0455  NA 125* 124* 121*  122* 121* 124* 123* 124*  -Clinically appears euvolemic, -check urine sodium and osmolarity, did not receive diuretics yesterday and will not receive him today, decrease Tegretol dose which also can lower sodium and check a.m. cortisol -Cardiology will initiate tolvaptan 15 mg x 1 for his hyponatremia   Sick sinus syndrome -S/p pacemaker with paced rhythm on telemetry at bedside -Continue to monitor carefully and cardiology is following   Remote history of A-fib -Not on anticoagulation.  Per cardiology if recurs, would need anticoagulation. -Currently on a amiodarone drip -His  heparin drip has now been discontinued and he is on dual antiplatelet therapy as above   Insomnia: -C/w Mirtazepine 15 mg po qHS   Nausea and vomiting: -Unclear etiology.   -Improved with supportive care, monitor, add laxatives he has been constipated for 5 days -Currently not on any Antiemetics   Acute kidney injury on CKD Stage 3a due to cardiogenic shock and ATN -Baseline serum creatinine 1.1.   -Creatinine peaked to 3.8, trending down but slightly worsening -BUN/Cr Trend: Recent Labs  Lab 08/24/22 2151 08/25/22 0429 08/26/22 0428 08/26/22 1912 08/27/22 0042 08/27/22 0936 08/27/22 1652 08/28/22 0455  BUN 64* 65* 68*  --  72* 70* 74* 72*  CREATININE 2.04* 1.92* 1.97* 1.91* 1.82* 1.92* 2.19* 2.12*  -Avoid Nephrotoxic Medications, Contrast Dyes, Hypotension and Dehydration to Ensure Adequate Renal Perfusion and will need to Renally Adjust Meds -Continue to Monitor and Trend Renal Function carefully and repeat CMP in the AM   Leukocytosis -Likely Reactive from above -Blood cultures x 2 showed no growth to date at 5 days -Chest x-ray done 8/17 and showed "Hypoinflation with minimal linear left basilar density likely atelectasis. Right-sided PICC line with tip in the region of the cavoatrial junction." -WBC Trend: Recent Labs  Lab 08/23/22 0445 08/24/22 1514 08/25/22 0429 08/26/22 0428 08/26/22 1912 08/27/22 0042 08/28/22 0455  WBC 12.2* 13.7* 10.0 9.0 10.8* 11.1* 12.2*  -Continue to  monitor for signs and symptoms of infection; no overt infection noted  GERD/GI Prophylaxis -C/w PPI with Pantoprazole 40 mg po daily   Normocytic Anemia -Hgb/Hct Trend: Recent Labs  Lab 08/25/22 0429 08/26/22 0428 08/26/22 1534 08/26/22 1541 08/26/22 1912 08/27/22 0042 08/28/22 0455  HGB 12.6* 12.5* 12.9* 13.6  13.6 13.2 12.6* 12.1*  HCT 36.2* 36.4* 38.0* 40.0  40.0 38.7* 36.4* 35.5*  MCV 89.6 90.3  --   --  89.0 90.1 88.8  -Check Anemia Panel in the AM -Continue to Monitor  for S/Sx of Bleeding; No overt bleeding noted -Repeat CBC in the AM  Thrombocytopenia -Platelet Count Trend: Recent Labs  Lab 08/23/22 0445 08/24/22 1514 08/25/22 0429 08/26/22 0428 08/26/22 1912 08/27/22 0042 08/28/22 0455  PLT 105* 123* 115* 110* 99* 104* 108*  -Continue to Monitor for S/Sx of Bleeding; No overt bleeding noted -Repeat CBC int the AM  Hypoalbuminemia -Patient's Albumin Trend: Recent Labs  Lab 08/20/22 1224 08/24/22 2151 08/27/22 0936 08/28/22 0950  ALBUMIN 4.2 3.6 3.6 3.1*  -Continue to Monitor and Trend and repeat CMP in the AM  Overweight -Complicates overall prognosis and care -Estimated body mass index is 27.6 kg/m as calculated from the following:   Height as of this encounter: 6' (1.829 m).   Weight as of this encounter: 92.3 kg.  -Weight Loss and Dietary Counseling given

## 2022-08-24 DIAGNOSIS — G4700 Insomnia, unspecified: Secondary | ICD-10-CM

## 2022-08-24 DIAGNOSIS — M25561 Pain in right knee: Secondary | ICD-10-CM | POA: Diagnosis not present

## 2022-08-24 DIAGNOSIS — N179 Acute kidney failure, unspecified: Secondary | ICD-10-CM | POA: Diagnosis not present

## 2022-08-24 DIAGNOSIS — G8929 Other chronic pain: Secondary | ICD-10-CM

## 2022-08-24 DIAGNOSIS — R570 Cardiogenic shock: Secondary | ICD-10-CM | POA: Diagnosis not present

## 2022-08-24 DIAGNOSIS — I5023 Acute on chronic systolic (congestive) heart failure: Secondary | ICD-10-CM | POA: Diagnosis not present

## 2022-08-24 LAB — COMPREHENSIVE METABOLIC PANEL
ALT: 38 U/L (ref 0–44)
AST: 22 U/L (ref 15–41)
Albumin: 3.6 g/dL (ref 3.5–5.0)
Alkaline Phosphatase: 83 U/L (ref 38–126)
Anion gap: 16 — ABNORMAL HIGH (ref 5–15)
BUN: 64 mg/dL — ABNORMAL HIGH (ref 8–23)
CO2: 30 mmol/L (ref 22–32)
Calcium: 8.9 mg/dL (ref 8.9–10.3)
Chloride: 79 mmol/L — ABNORMAL LOW (ref 98–111)
Creatinine, Ser: 2.04 mg/dL — ABNORMAL HIGH (ref 0.61–1.24)
GFR, Estimated: 32 mL/min — ABNORMAL LOW (ref >60)
Glucose, Bld: 160 mg/dL — ABNORMAL HIGH (ref 70–99)
Potassium: 4.2 mmol/L (ref 3.5–5.1)
Sodium: 125 mmol/L — ABNORMAL LOW (ref 135–145)
Total Bilirubin: 0.5 mg/dL (ref 0.3–1.2)
Total Protein: 6.6 g/dL (ref 6.5–8.1)

## 2022-08-24 LAB — CBC
HCT: 37.1 % — ABNORMAL LOW (ref 39.0–52.0)
Hemoglobin: 12.8 g/dL — ABNORMAL LOW (ref 13.0–17.0)
MCH: 30.5 pg (ref 26.0–34.0)
MCHC: 34.5 g/dL (ref 30.0–36.0)
MCV: 88.5 fL (ref 80.0–100.0)
Platelets: 123 10*3/uL — ABNORMAL LOW (ref 150–400)
RBC: 4.19 MIL/uL — ABNORMAL LOW (ref 4.22–5.81)
RDW: 13.2 % (ref 11.5–15.5)
WBC: 13.7 10*3/uL — ABNORMAL HIGH (ref 4.0–10.5)
nRBC: 0 % (ref 0.0–0.2)

## 2022-08-24 LAB — COOXEMETRY PANEL
Carboxyhemoglobin: 1.5 % (ref 0.5–1.5)
Methemoglobin: <0.7 % (ref 0.0–1.5)
O2 Saturation: 68.9 %
Total hemoglobin: 13.5 g/dL (ref 12.0–16.0)

## 2022-08-24 LAB — GLUCOSE, CAPILLARY
Glucose-Capillary: 108 mg/dL — ABNORMAL HIGH (ref 70–99)
Glucose-Capillary: 110 mg/dL — ABNORMAL HIGH (ref 70–99)

## 2022-08-24 MED ORDER — ONDANSETRON HCL 4 MG/2ML IJ SOLN: 4.0000 mg | Freq: Three times a day (TID) | INTRAMUSCULAR | Status: AC | PRN

## 2022-08-24 MED ORDER — ACETAMINOPHEN 325 MG PO TABS
650.0000 mg | ORAL_TABLET | Freq: Four times a day (QID) | ORAL | Status: DC | PRN
  Administered 2022-08-27: 650 mg via ORAL
  Filled 2022-08-24: qty 2

## 2022-08-24 MED ORDER — MELATONIN 3 MG PO TABS
3.0000 mg | ORAL_TABLET | Freq: Every day | ORAL | Status: DC
  Administered 2022-08-24 – 2022-08-28 (×5): 3 mg via ORAL
  Filled 2022-08-24 (×5): qty 1

## 2022-08-24 MED ORDER — FUROSEMIDE 10 MG/ML IJ SOLN
80.0000 mg | Freq: Once | INTRAMUSCULAR | Status: AC
  Administered 2022-08-24: 80 mg via INTRAVENOUS
  Filled 2022-08-24: qty 8

## 2022-08-24 MED ORDER — DICLOFENAC SODIUM 1 % EX GEL
4.0000 g | Freq: Four times a day (QID) | CUTANEOUS | Status: AC
  Administered 2022-08-24 – 2022-08-26 (×10): 4 g via TOPICAL
  Filled 2022-08-24: qty 100

## 2022-08-24 NOTE — Progress Notes (Addendum)
PROGRESS NOTE   Grant Stevens  ZOX:[id number]    DOB: [birth year]    DOA: 08/20/2022  PCP: Carylon Perches, MD   I have briefly reviewed patients previous medical records in [facility].  Chief Complaint  Patient presents with   Diarrhea   Chest Pain   Hypotension    Brief Narrative:  82 year old male with PMH of CAD, s/p CABG, chronic systolic CHF, s/p PPM, presented to ED on 08/20/2022 with complaints of diarrhea for 24 hours.  Also had intermittent chest pain with associated dyspnea and diaphoresis.  Hypotensive in the ED to 87/75 and hypoxic with oxygen saturation of 82%.  Admitted for acute on chronic systolic heart failure with cardiogenic shock, CAD with possible NSTEMI complicated by acute kidney injury.  Cardiology and nephrology consulting.  Remains on IV milrinone drip and IV Lasix.   Assessment & Plan:  Principal Problem:   NSTEMI (non-ST elevated myocardial infarction) (HCC) Active Problems:   Acute on chronic systolic CHF (congestive heart failure) (HCC)   AKI (acute kidney injury) (HCC)   History of atrial fibrillation   GERD (gastroesophageal reflux disease)   Acute on chronic systolic CHF with cardiogenic shock Advanced heart failure cardiologist assisting with management TTE 8/11: LVEF <20% Currently on IV milrinone infusion, was on high-dose IV Lasix 120 Mg 2 times daily on 8/13.  Defer diuretic management to cardiology. -8.2 L since admission and weight down by about 13 pounds. Off GDMT due to shock and acute kidney injury.  Not a candidate for advanced therapies per cardiology. Eventually cardiology plans coronary angiography if creatinine improves versus cardiac MRI.  CAD with possible NSTEMI History of CAD s/p CABG Completed heparin x 48 hours. Continue DAPT with aspirin and Plavix. Cardiac cath as discussed above.  Ventricular tachycardia Remains on amiodarone drip while on milrinone.  Off of lidocaine. Potassium 3.9 and magnesium 1.9.  Acute  kidney injury due to cardiogenic shock and ATN Baseline serum creatinine 1.1.  Creatinine peaked to 3.8, trending down, down to 2.16.  Hyperkalemia: Secondary to AKI.  Resolved.  Chronic right knee pain: Suspect osteoarthritis.  No history of trauma or gout. As needed Tylenol and topical Voltaren gel.  Hyponatremia Multifactorial due to decompensated CHF, AKI etc. Stable in the mid 120s.  Follow daily BMP Normal mentation. Defer tolvaptan to nephrology.  Sick sinus syndrome S/p pacemaker with paced rhythm on telemetry at bedside  Remote history of A-fib Not on anticoagulation.  Per cardiology if recurs, would need anticoagulation.  GERD: PPI.  Anemia and thrombocytopenia: Stable.  Insomnia: Trial of melatonin along with Remeron.  Delirium precautions.  May have ICU related delirium. Did start Remeron 15 Mg last night, unsure if this is causing his AMS.  If continues, may need to consider stopping this.  Nausea and vomiting: Unclear etiology.  Reported reflux symptoms earlier to nursing.  Started p.o. PPI. This afternoon started vomiting. Checking with cardiology to see if okay to use antiemetics given recent EKG on 8/12 with QTc of 621 ms in the context of V-paced rhythm and BBB morphology. Addendum: As communicated by Dr. Gala Romney, okay to use antiemetics on an as-needed basis.  Started as needed IV Zofran.  Prolonged QTc EKG 8/12: QTc of 621 ms.  However was V-paced with BBB morphology so not sure if this is accurate.  Potassium and magnesium okay. Repeating EKG now to recheck. Per RN, QTc remains prolonged at 550.  Do not yet see EKG on CHL.   Body mass index  is 27.6 kg/m.   ACP Documents: None present DVT prophylaxis: heparin injection 5,000 Units Start: 08/23/22 1400 Place TED hose Start: 08/21/22 1158 SCDs Start: 08/21/22 1146     Code Status: DNR:  Family Communication: None at bedside Disposition:  Status is: Inpatient Remains inpatient appropriate  because: Remains on IV milrinone, amiodarone.     Consultants:   Cardiology/advanced heart failure cardiologist Nephrology  Procedures:     Antimicrobials:      Subjective:  Patient reports chronic right knee pain and has some right knee pain after ambulating with PT yesterday, no worse than baseline.  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  Denies history of gout.  Per nursing, has not slept well in 2 days, confused overnight and pulled out leads and wires.  Denies dyspnea or chest pain.  Objective:   Vitals:   08/24/22 0300 08/24/22 0400 08/24/22 0500 08/24/22 0806  BP: 109/67 117/76 123/71   Pulse: 67 69 67   Resp: 18 17 (!) 30   Temp:  98.4 F (36.9 C)  97.9 F (36.6 C)  TempSrc:  Oral  Oral  SpO2: 95% 98% 98%   Weight:   92.3 kg   Height:        General exam: Elderly male, moderately built and nourished lying comfortably propped up in bed without distress. Neck: Has left neck central line. Respiratory system: Occasional basal crackles but otherwise clear to auscultation.  Midline sternotomy scar. Cardiovascular system: S1 & S2 heard, RRR. No JVD, murmurs, rubs, gallops or clicks. No pedal edema.  Telemetry at bedside shows V paced rhythm. Gastrointestinal system: Abdomen is nondistended, soft and nontender. No organomegaly or masses felt. Normal bowel sounds heard. Central nervous system: Alert and oriented. No focal neurological deficits. Extremities: Symmetric 5 x 5 power.  Right medial leg venous stripping scar. Skin: No rashes, lesions or ulcers Psychiatry: Judgement and insight appear normal. Mood & affect appropriate.     Data Reviewed:   I have personally reviewed following labs and imaging studies   CBC: Recent Labs  Lab 08/20/22 1224 08/21/22 0441 08/22/22 0502 08/22/22 2328 08/23/22 0445  WBC 11.8*   < > 13.1* 12.7* 12.2*  NEUTROABS 10.4*  --   --   --   --   HGB 12.3*   < > 11.9* 11.7*  11.8*  HCT 38.1*   < > 35.5* 34.6* 34.6*  MCV 96.2   < > 93.7 90.1 89.4  PLT 114*   < > 96* 107* 105*   < > = values in this interval not displayed.    Basic Metabolic Panel: Recent Labs  Lab 08/21/22 0009 08/21/22 0441 08/22/22 0502 08/22/22 1325 08/22/22 2328 08/23/22 0445 08/24/22 0408  NA 133*   < > 128* 128* 126* 127* 125*  K 5.3*   < > 4.1 4.9 4.9 4.3 3.9  CL 99   < > 91* 91* 87* 88* 81*  CO2 21*   < > 24 23 29 25 30   GLUCOSE 147*   < > 105* 118* 101* 100* 95  BUN 62*   < > 67* 68* 66* 65* 65*  CREATININE 3.63*   < > 2.93* 2.75* 2.49* 2.37* 2.16*  CALCIUM 8.3*   < > 8.4* 8.6* 8.8* 8.8* 9.0  MG 2.3  --  2.0  --  2.1 2.1 1.9  PHOS  --   --   --   --  4.2  --   --    < > =  values in this interval not displayed.    Liver Function Tests: Recent Labs  Lab 08/20/22 1224  AST 66*  ALT 40  ALKPHOS 66  BILITOT 0.7  PROT 7.2  ALBUMIN 4.2    CBG: Recent Labs  Lab 08/21/22 0334  GLUCAP 159*    Microbiology Studies:   Recent Results (from the past 240 hour(s))  Culture, blood (routine x 2)     Status: None (Preliminary result)   Collection Time: 08/20/22 12:24 PM   Specimen: Right Antecubital; Blood  Result Value Ref Range Status   Specimen Description   Final    RIGHT ANTECUBITAL BOTTLES DRAWN AEROBIC AND ANAEROBIC   Special Requests Blood Culture adequate volume  Final   Culture   Final    NO GROWTH 4 DAYS Performed at [facility], [address] [place], [place] [address]    Report Status PENDING  Incomplete  Culture, blood (routine x 2)     Status: None (Preliminary result)   Collection Time: 08/20/22 12:24 PM   Specimen: Left Antecubital; Blood  Result Value Ref Range Status   Specimen Description   Final    LEFT ANTECUBITAL BOTTLES DRAWN AEROBIC AND ANAEROBIC   Special Requests Blood Culture adequate volume  Final   Culture   Final    NO GROWTH 4 DAYS Performed at [facility], [address], [place], [place] [address]    Report Status PENDING   Incomplete  MRSA Next Gen by PCR, Nasal     Status: None   Collection Time: 08/20/22  4:01 PM   Specimen: Nasal Mucosa; Nasal Swab  Result Value Ref Range Status   MRSA by PCR Next Gen NOT DETECTED NOT DETECTED Final    Comment: (NOTE) The GeneXpert MRSA Assay (FDA approved for NASAL specimens only), is one component of a comprehensive MRSA colonization surveillance program. It is not intended to diagnose MRSA infection nor to guide or monitor treatment for MRSA infections. Test performance is not FDA approved in patients less than 49 years old. Performed at [facility], [address] [place], [place] [address]   Respiratory (~20 pathogens) panel by PCR     Status: None   Collection Time: 08/22/22  7:38 AM   Specimen: Nasopharyngeal Swab; Respiratory  Result Value Ref Range Status   Adenovirus NOT DETECTED NOT DETECTED Final   Coronavirus 229E NOT DETECTED NOT DETECTED Final    Comment: (NOTE) The Coronavirus on the Respiratory Panel, DOES NOT test for the novel  Coronavirus (2019 nCoV)    Coronavirus HKU1 NOT DETECTED NOT DETECTED Final   Coronavirus NL63 NOT DETECTED NOT DETECTED Final   Coronavirus OC43 NOT DETECTED NOT DETECTED Final   Metapneumovirus NOT DETECTED NOT DETECTED Final   Rhinovirus / Enterovirus NOT DETECTED NOT DETECTED Final   Influenza A NOT DETECTED NOT DETECTED Final   Influenza B NOT DETECTED NOT DETECTED Final   Parainfluenza Virus 1 NOT DETECTED NOT DETECTED Final   Parainfluenza Virus 2 NOT DETECTED NOT DETECTED Final   Parainfluenza Virus 3 NOT DETECTED NOT DETECTED Final   Parainfluenza Virus 4 NOT DETECTED NOT DETECTED Final   Respiratory Syncytial Virus NOT DETECTED NOT DETECTED Final   Bordetella pertussis NOT DETECTED NOT DETECTED Final   Bordetella Parapertussis NOT DETECTED NOT DETECTED Final   Chlamydophila pneumoniae NOT DETECTED NOT DETECTED Final   Mycoplasma pneumoniae NOT DETECTED NOT DETECTED Final    Comment: Performed at  [facility], [address], [place],  [place] [address]  SARS Coronavirus 2 by RT PCR (hospital order, performed in [facility] hospital lab) *cepheid single result test* Anterior Nasal Swab     Status: None   Collection Time: 08/22/22  7:38 AM   Specimen: Anterior Nasal Swab  Result Value Ref Range Status   SARS Coronavirus 2 by RT PCR NEGATIVE NEGATIVE Final    Comment: Performed at [facility], [address], [place], [place] [address]    Radiology Studies:  No results found.  Scheduled Meds:    aspirin EC  81 mg Oral Daily   atorvastatin  80 mg Oral QHS   carbamazepine  400 mg Oral BID   Chlorhexidine Gluconate Cloth  6 each Topical Daily   clopidogrel  75 mg Oral Daily   divalproex  250 mg Oral BID   heparin injection (subcutaneous)  5,000 Units Subcutaneous Q8H   pantoprazole  40 mg Oral QAC breakfast   temazepam  15 mg Oral QHS    Continuous Infusions:    sodium chloride Stopped (08/20/22 1740)   sodium chloride     amiodarone 30 mg/hr (08/24/22 0500)   milrinone 0.25 mcg/kg/min (08/24/22 0602)     LOS: 4 days     Marcellus Scott, MD,  FACP, FHM, SFHM, [facility], [facility]   [facility] [place]     To contact the attending provider between 7A-7P or the covering provider during after hours 7P-7A, please log into the web site [email or web address] and access using universal  password for that web site. If you do not have the password, please call the hospital operator.  08/24/2022, 9:18 AM

## 2022-08-24 NOTE — Progress Notes (Addendum)
Not feeling great this afternoon. More fatigue and intermittent nausea.   CVP 8-9.  Milrinone cut back to 0.125 around 11 am.  ? If symptoms are due to low-output.   Check CO-OX and CMET STAT.  ADDENDUM: Repeat CO-OX okay, 69%

## 2022-08-24 NOTE — Progress Notes (Signed)
Physical Therapy Treatment Patient Details Name: Grant Stevens MRN: [id number] DOB: [birth year] Today's Date: 08/24/2022   History of Present Illness 82 y.o. male presents to [facility] hospital on 08/20/2022 as a transfer from [facility] with diarrhea and recent chest pain. Pt found to have AKI and NSTEMI. PMH includes OA, afib, bladder CA, BPH, HFrEF, CABG, depression, HTN, bipolar, HLD.    PT Comments  Patient with limited progress due to weakness today and R knee pain.  Only managed to walk in room today with imbalance impacting safety with +1 A for managing lines, etc.  Patient remains appropriate for post-acute inpatient rehab (<3 hours/day).  PT will continue to follow.     If plan is discharge home, recommend the following: A little help with walking and/or transfers;A little help with bathing/dressing/bathroom;Assistance with cooking/housework;Assist for transportation;Help with stairs or ramp for entrance   Can travel by private vehicle     Yes  Equipment Recommendations  Rolling walker (2 wheels);BSC/3in1    Recommendations for Other Services       Precautions / Restrictions Precautions Precautions: Fall     Mobility  Bed Mobility Overal bed mobility: Needs Assistance Bed Mobility: Supine to Sit     Supine to sit: Supervision     General bed mobility comments: greatly increased time, effortful, some help for line management    Transfers Overall transfer level: Needs assistance Equipment used: Rolling walker (2 wheels)   Sit to Stand: Min assist           General transfer comment: assist for balance, cues for hand placement    Ambulation/Gait Ambulation/Gait assistance: Mod assist Gait Distance (Feet): 15 Feet (x 2) Assistive device: Rolling walker (2 wheels) Gait Pattern/deviations: Step-to pattern, Decreased stride length, Antalgic       General Gait Details: assist for walker management, due to posterior bias, c/o weakness and R knee pain; rested on EOB  after walking around then walked back around to recliner   Stairs             Wheelchair Mobility     Tilt Bed    Modified Rankin (Stroke Patients Only)       Balance Overall balance assessment: Needs assistance Sitting-balance support: Feet supported Sitting balance-Leahy Scale: Fair     Standing balance support: Reliant on assistive device for balance Standing balance-Leahy Scale: Poor                              Cognition Arousal: Alert Behavior During Therapy: WFL for tasks assessed/performed Overall Cognitive Status: Impaired/Different from baseline Area of Impairment: Memory                     Memory: Decreased short-term memory         General Comments: repeating information, RN reports some ICU delirium vs sundowning        Exercises      General Comments General comments (skin integrity, edema, etc.): VSS, RN aware of difficulty compared to last session      Pertinent Vitals/Pain Pain Assessment Pain Assessment: Faces Faces Pain Scale: Hurts little more Pain Location: R knee with ambulation Pain Descriptors / Indicators: Discomfort, Aching Pain Intervention(s): Monitored during session, Repositioned, Limited activity within patient's tolerance    Home Living                          Prior  Function            PT Goals (current goals can now be found in the care plan section) Progress towards PT goals: Not progressing toward goals - comment    Frequency    Min 1X/week      PT Plan      Co-evaluation              AM-PAC PT "6 Clicks" Mobility   Outcome Measure  Help needed turning from your back to your side while in a flat bed without using bedrails?: A Little Help needed moving from lying on your back to sitting on the side of a flat bed without using bedrails?: A Little Help needed moving to and from a bed to a chair (including a wheelchair)?: A Little Help needed standing up from  a chair using your arms (e.g., wheelchair or bedside chair)?: A Little Help needed to walk in hospital room?: Total Help needed climbing 3-5 steps with a railing? : Total 6 Click Score: 14    End of Session Equipment Utilized During Treatment: Gait belt Activity Tolerance: Patient limited by fatigue Patient left: in chair;with call bell/phone within reach   PT Visit Diagnosis: Other abnormalities of gait and mobility (R26.89);Muscle weakness (generalized) (M62.81)     Time: 0865-7846 PT Time Calculation (min) (ACUTE ONLY): 28 min  Charges:    $Gait Training: 8-22 mins $Therapeutic Activity: 8-22 mins PT General Charges $$ ACUTE PT VISIT: 1 Visit                     Sheran Lawless, PT Acute Rehabilitation Services Office:[phone] 08/24/2022    [name] 08/24/2022, 1:31 PM

## 2022-08-24 NOTE — Progress Notes (Signed)
PROGRESS NOTE   Grant Stevens  GLO:[id number]    DOB: [birth year]    DOA: 08/20/2022  PCP: Carylon Perches, MD   I have briefly reviewed patients previous medical records in [facility].  Chief Complaint  Patient presents with   Diarrhea   Chest Pain   Hypotension    Brief Narrative:  82 year old male with PMH of CAD, s/p CABG, chronic systolic CHF, s/p PPM, presented to ED on 08/20/2022 with complaints of diarrhea for 24 hours.  Also had intermittent chest pain with associated dyspnea and diaphoresis.  Hypotensive in the ED to 87/75 and hypoxic with oxygen saturation of 82%.  Admitted for acute on chronic systolic heart failure with cardiogenic shock, CAD with possible NSTEMI complicated by acute kidney injury.  Cardiology and nephrology consulting.  Remains on IV milrinone drip and IV Lasix.   Assessment & Plan:   Acute on chronic systolic CHF with cardiogenic shock Advanced heart failure taem following -TTE 8/11: LVEF <20% with severely reduced RV Currently on IV milrinone infusion, Rx w/ IV lasix -8.2 L since admission and weight down by about 13 pounds. -Off GDMT due to shock and acute kidney injury.  - Not a candidate for advanced therapies per cardiology. Eventually cardiology plans coronary angiography if creatinine improves versus cardiac MRI.  CAD with possible NSTEMI History of CAD s/p CABG Completed heparin x 48 hours. Continue DAPT with aspirin and Plavix. Cardiac cath as discussed above.  Ventricular tachycardia Remains on amiodarone drip while on milrinone.  Off of lidocaine. Potassium 3.9 and magnesium 1.9.  Acute kidney injury due to cardiogenic shock and ATN Baseline serum creatinine 1.1.  Creatinine peaked to 3.8, trending down, down to 2.16.  Hyperkalemia: Secondary to AKI.  Resolved.  Chronic right knee pain: Suspect osteoarthritis.  No history of trauma or gout. As needed Tylenol and topical Voltaren gel.  Hyponatremia Multifactorial due to  decompensated CHF, diuretics, AKI etc. Stable in the mid 120s, mental status stable Defer tolvaptan to nephrology.  Sick sinus syndrome S/p pacemaker with paced rhythm on telemetry at bedside  Remote history of A-fib Not on anticoagulation.  Per cardiology if recurs, would need anticoagulation.  GERD: PPI.  Anemia and thrombocytopenia: Stable.  Insomnia: Trial of melatonin along with Remeron.  Delirium precautions.  May have ICU related delirium. Did start Remeron 15 Mg last night, unsure if this is causing his AMS.  If continues, may need to consider stopping this.  Nausea and vomiting: Unclear etiology.  Reported reflux symptoms earlier to nursing.  Started p.o. PPI. This afternoon started vomiting. Checking with cardiology to see if okay to use antiemetics given recent EKG on 8/12 with QTc of 621 ms in the context of V-paced rhythm and BBB morphology. Addendum: As communicated by Dr. Gala Romney, okay to use antiemetics on an as-needed basis.  Started as needed IV Zofran.  Prolonged QTc EKG 8/12: QTc of 621 ms.  However was V-paced with BBB morphology so not sure if this is accurate.  Potassium and magnesium okay. Repeating EKG now to recheck. Per RN, QTc remains prolonged at 550.     Body mass index is 27.6 kg/m.   ACP Documents: None present DVT prophylaxis: Hep SQ   Code Status: DNR:  Family Communication: None at bedside Disposition: TBD     Consultants:   Cardiology/advanced heart failure cardiologist Nephrology  Procedures:     Antimicrobials:      Subjective:  Patient reports chronic right knee pain and has some right  knee pain after ambulating with PT yesterday, no worse than baseline.  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  Denies history of gout.  Per nursing, has not slept well in 2 days, confused overnight and pulled out leads and wires.  Denies dyspnea or chest pain.  Objective:   Vitals:    08/24/22 1400 08/24/22 1500 08/24/22 1548 08/24/22 1600  BP: 99/67 112/71  120/61  Pulse: 64 66    Resp: (!) 31 15  19   Temp:   97.9 F (36.6 C)   TempSrc:   Axillary   SpO2: 97% 94%  94%  Weight:      Height:        General exam: Elderly male, moderately built and nourished lying comfortably propped up in bed without distress. Neck: Has left neck central line. Respiratory system: Occasional basal crackles but otherwise clear to auscultation.  Midline sternotomy scar. Cardiovascular system: S1 & S2 heard, RRR. No JVD, murmurs, rubs, gallops or clicks. No pedal edema.  Telemetry at bedside shows V paced rhythm. Gastrointestinal system: Abdomen is nondistended, soft and nontender. No organomegaly or masses felt. Normal bowel sounds heard. Central nervous system: Alert and oriented. No focal neurological deficits. Extremities: Symmetric 5 x 5 power.  Right medial leg venous stripping scar. Skin: No rashes, lesions or ulcers Psychiatry: Judgement and insight appear normal. Mood & affect appropriate.     Data Reviewed:   I have personally reviewed following labs and imaging studies   CBC: Recent Labs  Lab 08/20/22 1224 08/21/22 0441 08/22/22 2328 08/23/22 0445 08/24/22 1514  WBC 11.8*   < > 12.7* 12.2* 13.7*  NEUTROABS 10.4*  --   --   --   --   HGB 12.3*   < > 11.7* 11.8* 12.8*  HCT 38.1*   < > 34.6* 34.6* 37.1*  MCV 96.2   < > 90.1 89.4 88.5  PLT 114*   < > 107* 105* 123*   < > = values in this interval not displayed.    Basic Metabolic Panel: Recent Labs  Lab 08/21/22 0009 08/21/22 0441 08/22/22 0502 08/22/22 1325 08/22/22 2328 08/23/22 0445 08/24/22 0408  NA 133*   < > 128* 128* 126* 127* 125*  K 5.3*   < > 4.1 4.9 4.9 4.3 3.9  CL 99   < > 91* 91* 87* 88* 81*  CO2 21*   < > 24 23 29 25 30   GLUCOSE 147*   < > 105* 118* 101* 100* 95  BUN 62*   < > 67* 68* 66* 65* 65*  CREATININE 3.63*   < > 2.93* 2.75* 2.49* 2.37* 2.16*  CALCIUM 8.3*   < > 8.4* 8.6* 8.8*  8.8* 9.0  MG 2.3  --  2.0  --  2.1 2.1 1.9  PHOS  --   --   --   --  4.2  --   --    < > = values in this interval not displayed.    Liver Function Tests: Recent Labs  Lab 08/20/22 1224  AST 66*  ALT 40  ALKPHOS 66  BILITOT 0.7  PROT 7.2  ALBUMIN 4.2    CBG: Recent Labs  Lab 08/21/22 0334 08/24/22 1119 08/24/22 1550  GLUCAP 159* 108* 110*    Microbiology Studies:   Recent Results (from the past 240 hour(s))  Culture, blood (routine x 2)     Status: None (Preliminary result)  Collection Time: 08/20/22 12:24 PM   Specimen: Right Antecubital; Blood  Result Value Ref Range Status   Specimen Description   Final    RIGHT ANTECUBITAL BOTTLES DRAWN AEROBIC AND ANAEROBIC   Special Requests Blood Culture adequate volume  Final   Culture   Final    NO GROWTH 4 DAYS Performed at [facility], [address], [place], [place] [address]    Report Status PENDING  Incomplete  Culture, blood (routine x 2)     Status: None (Preliminary result)   Collection Time: 08/20/22 12:24 PM   Specimen: Left Antecubital; Blood  Result Value Ref Range Status   Specimen Description   Final    LEFT ANTECUBITAL BOTTLES DRAWN AEROBIC AND ANAEROBIC   Special Requests Blood Culture adequate volume  Final   Culture   Final    NO GROWTH 4 DAYS Performed at [facility], [address], [place], [place] [address]    Report Status PENDING  Incomplete  MRSA Next Gen by PCR, Nasal     Status: None   Collection Time: 08/20/22  4:01 PM   Specimen: Nasal Mucosa; Nasal Swab  Result Value Ref Range Status   MRSA by PCR Next Gen NOT DETECTED NOT DETECTED Final    Comment: (NOTE) The GeneXpert MRSA Assay (FDA approved for NASAL specimens only), is one component of a comprehensive MRSA colonization surveillance program. It is not intended to diagnose MRSA infection nor to guide or monitor treatment for MRSA infections. Test performance is not FDA approved in patients less than 59 years old. Performed at  [facility], [address], [place], [place] [address]   Respiratory (~20 pathogens) panel by PCR     Status: None   Collection Time: 08/22/22  7:38 AM   Specimen: Nasopharyngeal Swab; Respiratory  Result Value Ref Range Status   Adenovirus NOT DETECTED NOT DETECTED Final   Coronavirus 229E NOT DETECTED NOT DETECTED Final    Comment: (NOTE) The Coronavirus on the Respiratory Panel, DOES NOT test for the novel  Coronavirus (2019 nCoV)    Coronavirus HKU1 NOT DETECTED NOT DETECTED Final   Coronavirus NL63 NOT DETECTED NOT DETECTED Final   Coronavirus OC43 NOT DETECTED NOT DETECTED Final   Metapneumovirus NOT DETECTED NOT DETECTED Final   Rhinovirus / Enterovirus NOT DETECTED NOT DETECTED Final   Influenza A NOT DETECTED NOT DETECTED Final   Influenza B NOT DETECTED NOT DETECTED Final   Parainfluenza Virus 1 NOT DETECTED NOT DETECTED Final   Parainfluenza Virus 2 NOT DETECTED NOT DETECTED Final   Parainfluenza Virus 3 NOT DETECTED NOT DETECTED Final   Parainfluenza Virus 4 NOT DETECTED NOT DETECTED Final   Respiratory Syncytial Virus NOT DETECTED NOT DETECTED Final   Bordetella pertussis NOT DETECTED NOT DETECTED Final   Bordetella Parapertussis NOT DETECTED NOT DETECTED Final   Chlamydophila pneumoniae NOT DETECTED NOT DETECTED Final   Mycoplasma pneumoniae NOT DETECTED NOT DETECTED Final    Comment: Performed at [facility], [address], [place], [place] [address]  SARS Coronavirus 2 by RT PCR (hospital order, performed in [facility] hospital lab) *cepheid single result test* Anterior Nasal Swab     Status: None   Collection Time: 08/22/22  7:38 AM   Specimen: Anterior Nasal Swab  Result Value Ref Range Status   SARS Coronavirus 2 by RT PCR NEGATIVE NEGATIVE Final    Comment: Performed at [facility], [address], [place], [place] [address]  Radiology Studies:  No results found.  Scheduled Meds:    aspirin EC  81 mg Oral Daily   atorvastatin   80 mg Oral QHS   carbamazepine  400 mg Oral BID   Chlorhexidine Gluconate Cloth  6 each Topical Daily   clopidogrel  75 mg Oral Daily   diclofenac Sodium  4 g Topical QID   divalproex  250 mg Oral BID   heparin injection (subcutaneous)  5,000 Units Subcutaneous Q8H   melatonin  3 mg Oral QHS   pantoprazole  40 mg Oral QAC breakfast   temazepam  15 mg Oral QHS    Continuous Infusions:    sodium chloride Stopped (08/20/22 1740)   sodium chloride     amiodarone 30 mg/hr (08/24/22 1410)   milrinone 0.125 mcg/kg/min (08/24/22 1200)     LOS: 4 days     Zannie Cove, MD,   [facility]  08/24/2022, 6:38 PM

## 2022-08-24 NOTE — Progress Notes (Signed)
Remote pacemaker transmission.   

## 2022-08-24 NOTE — TOC Initial Note (Signed)
Transition of Care [facility]) - Initial/Assessment Note    Patient Details  Name: Grant Stevens MRN: [id number] Date of Birth: 24-Dec-1940  Transition of [facility]) CM/SW Contact:    Elliot Cousin, RN Phone Number: [phone] 08/24/2022, 1:35 PM  Clinical Narrative:   HF TOC CM spoke to pt states he lives alone. He is interested in gong to IP rehab or SNF rehab to [facility]. States he has cane and scale at home for daily weights. States his girlfriend or sister will proved transportation to his appts.                 Expected Discharge Plan: Skilled Nursing Facility Barriers to Discharge: Continued Medical Work up   Patient Goals and CMS Choice Patient states their goals for this hospitalization and ongoing recovery are:: wants to go to rehab CMS Medicare.gov Compare Post Acute Care list provided to:: Patient Choice offered to / list presented to : Patient [place] ownership interest in [facility].provided to:: Patient    Expected Discharge Plan and Services In-house Referral: Clinical Social Work Discharge Planning Services: CM Consult Post Acute Care Choice: Skilled Nursing Facility Living arrangements for the past 2 months: Single Family Home                                      Prior Living Arrangements/Services Living arrangements for the past 2 months: Single Family Home Lives with:: Self Patient language and need for interpreter reviewed:: Yes Do you feel safe going back to the place where you live?: Yes      Need for Family Participation in Patient Care: Yes (Comment) Care giver support system in place?: Yes (comment) Current home services: DME (Cane, scale) Criminal Activity/Legal Involvement Pertinent to Current Situation/Hospitalization: No - Comment as needed  Activities of Daily Living      Permission Sought/Granted Permission sought to share information with : Case Manager, Family Supports, PCP Permission granted to share  information with : Yes, Verbal Permission Granted  Share Information with NAME: Marzella Schlein  Permission granted to share info w AGENCY: SNF rehab  Permission granted to share info w Relationship: girlfriend  Permission granted to share info w Contact Information: [phone]  Emotional Assessment Appearance:: Appears stated age Attitude/Demeanor/Rapport: Engaged Affect (typically observed): Accepting Orientation: : Oriented to Self, Oriented to Place, Oriented to  Time, Oriented to Situation   Psych Involvement: No (comment)  Admission diagnosis:  NSTEMI (non-ST elevated myocardial infarction) [facility]) [I21.4] Patient Active Problem List   Diagnosis Date Noted   Acute on chronic systolic CHF (congestive heart failure) (HCC) 08/23/2022   Cardiogenic shock (HCC) 08/22/2022   NSTEMI (non-ST elevated myocardial infarction) (HCC) 08/20/2022   Hypotension 08/20/2022   Acute metabolic encephalopathy 07/15/2021   Dehydration 07/15/2021   Fall at home, initial encounter 07/15/2021   Tick bite 07/15/2021   Hoarseness of voice 07/08/2021   Dysphagia 07/08/2021   AKI (acute kidney injury) (HCC) 07/02/2021   Constipation 02/18/2021   Intermittent diarrhea 08/17/2020   Barrett's esophagus without dysplasia 08/17/2020   GERD (gastroesophageal reflux disease) 08/17/2020   Heart block AV complete (HCC) 05/12/2020   CAD (coronary artery disease) of artery bypass graft 05/12/2020   Syncope and collapse 05/12/2020   BPH (benign prostatic hyperplasia) 01/26/2015   Dizziness 10/19/2014   Coronary atherosclerosis of native coronary artery    History of atrial fibrillation  Essential hypertension, benign 09/22/2010   Mixed hyperlipidemia 11/28/2008   SLEEP APNEA 11/28/2008   PCP:  Carylon Perches, MD Pharmacy:   [facility] [phone] - [place], [place] - [address] [place] [address] Phone: [phone] Fax:  [phone]     Social Determinants of Health (SDOH) Social History: SDOH Screenings   Tobacco Use: Medium Risk (08/20/2022)   SDOH Interventions:     Readmission Risk Interventions     No data to display

## 2022-08-24 NOTE — Progress Notes (Addendum)
Advanced Heart Failure Rounding Note  PCP-Cardiologist: Nona Dell, MD   Subjective:    Good diuresis again yesterday with IV lasix. Weight down total of 13 lb.   CO-OX 65% on 0.25 milrinone.  CVP 8.  Scr down to 2.16.  Apparently became confused overnight and was pulling on leads/wires.  No dyspnea. Ambulated with OT yesterday.   Objective:   Weight Range: 92.3 kg Body mass index is 27.6 kg/m.   Vital Signs:   Temp:  [98.2 F (36.8 C)-98.5 F (36.9 C)] 98.4 F (36.9 C) (08/14 0400) Pulse Rate:  [62-99] 67 (08/14 0500) Resp:  [11-30] 30 (08/14 0500) BP: (92-126)/(36-85) 123/71 (08/14 0500) SpO2:  [95 %-100 %] 98 % (08/14 0500) Weight:  [92.3 kg] 92.3 kg (08/14 0500) Last BM Date : 08/23/22  Weight change: Filed Weights   08/21/22 0600 08/22/22 0500 08/24/22 0500  Weight: 97.4 kg 95.2 kg 92.3 kg    Intake/Output:   Intake/Output Summary (Last 24 hours) at 08/24/2022 0759 Last data filed at 08/24/2022 0700 Gross per 24 hour  Intake 959.73 ml  Output 4301 ml  Net -3341.27 ml      Physical Exam    General: Sitting up in bed HEENT: normal Neck: supple. JVP 7-8. Carotids 2+ bilat; no bruits.  Cor: PMI nondisplaced. Regular rate & rhythm. No rubs, gallops or murmurs. Lungs: clear Abdomen: soft, nontender, nondistended.  Extremities: no cyanosis, clubbing, rash, edema Neuro: alert & orientedx3. Affect pleasant     Telemetry   V paced 60s-70s  Labs    CBC Recent Labs    08/22/22 2328 08/23/22 0445  WBC 12.7* 12.2*  HGB 11.7* 11.8*  HCT 34.6* 34.6*  MCV 90.1 89.4  PLT 107* 105*   Basic Metabolic Panel Recent Labs    75/64/33 2328 08/23/22 0445 08/24/22 0408  NA 126* 127* 125*  K 4.9 4.3 3.9  CL 87* 88* 81*  CO2 29 25 30   GLUCOSE 101* 100* 95  BUN 66* 65* 65*  CREATININE 2.49* 2.37* 2.16*  CALCIUM 8.8* 8.8* 9.0  MG 2.1 2.1 1.9  PHOS 4.2  --   --    Liver Function Tests No results for input(s): "AST", "ALT",  "ALKPHOS", "BILITOT", "PROT", "ALBUMIN" in the last 72 hours.  No results for input(s): "LIPASE", "AMYLASE" in the last 72 hours.  Cardiac Enzymes No results for input(s): "CKTOTAL", "CKMB", "CKMBINDEX", "TROPONINI" in the last 72 hours.  BNP: BNP (last 3 results) Recent Labs    08/21/22 0441  BNP 2,520.5*    ProBNP (last 3 results) No results for input(s): "PROBNP" in the last 8760 hours.   D-Dimer No results for input(s): "DDIMER" in the last 72 hours. Hemoglobin A1C Recent Labs    08/22/22 0502  HGBA1C 6.0*   Fasting Lipid Panel Recent Labs    08/22/22 0502  CHOL 132  HDL 50  LDLCALC 73  TRIG 45  CHOLHDL 2.6   Thyroid Function Tests Recent Labs    08/22/22 0502  TSH 1.499    Other results:   Imaging    No results found.   Medications:     Scheduled Medications:  aspirin EC  81 mg Oral Daily   atorvastatin  80 mg Oral QHS   carbamazepine  400 mg Oral BID   Chlorhexidine Gluconate Cloth  6 each Topical Daily   clopidogrel  75 mg Oral Daily   divalproex  250 mg Oral BID   heparin injection (subcutaneous)  5,000 Units Subcutaneous  Q8H   pantoprazole  40 mg Oral QAC breakfast   temazepam  15 mg Oral QHS    Infusions:  sodium chloride Stopped (08/20/22 1740)   sodium chloride     amiodarone 30 mg/hr (08/24/22 0500)   milrinone 0.25 mcg/kg/min (08/24/22 0602)    PRN Medications: Place/Maintain arterial line **AND** sodium chloride, mouth rinse   Assessment/Plan    1. Acute on chronic systolic HF -> cardiogenic shock - In setting of OOH infarct - Echo 2/24 EF 25-30% RV mildly reduced - Echo 08/21/22: EF < 20% RV severely reduced - Initial co-ox 31% - Currently on milrinone 0.25 with CO-OX 65%. Off NE. - Decrease milrinone to 0.125. - Off GDMT due to shock, AKI - Diuresing well. CVP 8. Give 80 mg IV lasix today. Switch to po Torsemide tomorrow. - Not candidate for advanced therapies with age - Eventually will need coronary angio  if/when SCr improves (will tentatively plan for 08/16). Can also consider cMRI - TED hose   2. CAD with possible NSTEMI - h/o CAD s/p CABG  - suspect OOH infarct with loss of SVG - had heparin x 48 hours - DAPT/statin  - Eventually will need coronary angio if/when SCr improves.    3. Ventricular tachycardia - No recurrence overnight - continue amio gtt while on milrinone - lidocaine off - Keep K > 4.0 Mg > 2.0   4. AKI - due to shock/ATN - baseline Scr 1.1 , peaked at 3.8. Now trending down  - continue hemodynamic support - Renal has seen   5. SSS - s/p pacer  6. Remote history of atrial fibrillation - Has not been anticoagulated - No recurrence this admission. Would need anticoagulation if recurs.  7. Hyponatremia - restrict FW - will use tolvapatan if < 125   8. Limited DNR - Spoke with him in detail about GOC wishes - Ok with intubation and defibrillation  - Would not want CPR  Okay to transfer out of ICU. Waiting for bed.  Will consult CIR.   Length of Stay: 4  FINCH, LINDSAY N, PA-C  08/24/2022, 7:59 AM  Advanced Heart Failure Team Pager [phone] (M-F; 7a - 5p)  Please contact [facility] for night-coverage after hours (5p -7a ) and weekends on [email or web address]   Patient seen and examined with the above-signed Advanced Practice Provider and/or Housestaff. I personally reviewed laboratory data, imaging studies and relevant notes. I independently examined the patient and formulated the important aspects of the plan. I have edited the note to reflect any of my changes or salient points. I have personally discussed the plan with the patient and/or family.  Continues to improved. No CP or SOB. Diuresing well. Able to ambulate with walker.   SCr improving  General: Elderly  No resp difficulty HEENT: normal Neck: supple. LIJ TLC . Carotids 2+ bilat; no bruits. No lymphadenopathy or thryomegaly appreciated. Cor: PMI nondisplaced. Regular rate & rhythm. No rubs,  gallops or murmurs. Lungs: clear Abdomen: soft, nontender, nondistended. No hepatosplenomegaly. No bruits or masses. Good bowel sounds. Extremities: no cyanosis, clubbing, rash, tr edema Neuro: alert & orientedx3, cranial nerves grossly intact. moves all 4 extremities w/o difficulty. Affect pleasant   Will continue IV diuresis one more day. If Scr < 2.0 will plan cath Friday. Will need CIR vs SNF at d/c.   Can go out of ICU.   Arvilla Meres, MD  3:44 PM

## 2022-08-24 NOTE — Progress Notes (Signed)
Inpatient Rehab Admissions Coordinator:   Consult received and chart reviewed.  Admitted with acute on chronic SHF with cardiogenic shock, possible NSTEMI.  Hospital course c/b AKI.  Diuresing with IV lasix.  Participating with therapy and limited by weakness and decreased endurance.  Recommendations are for SNF.  [employer] will not approve CIR admission given diagnosis and recommendations.  Agree with SNF for post acute rehab.   Estill Dooms, PT, DPT Admissions Coordinator [phone] 08/24/22  3:43 PM

## 2022-08-24 NOTE — NC FL2 (Addendum)
[place] MEDICAID FL2 LEVEL OF CARE FORM     IDENTIFICATION  Patient Name: Grant Stevens Birthdate: 09-26-1940 Sex: male Admission Date (Current Location): 08/20/2022  [facility] and [place] Number:  Producer, television/film/video and Address:  [facility], [address] [place], [place] [address]      Provider Number: [id number]  Attending Physician Name and Address:  Elease Etienne, MD  Relative Name and Phone Number:       Current Level of Care: SNF Recommended Level of Care: Skilled Nursing Facility Prior Approval Number:    Date Approved/Denied:   PASRR Number: [id number]  Discharge Plan: SNF    Current Diagnoses: Patient Active Problem List   Diagnosis Date Noted   Acute on chronic systolic CHF (congestive heart failure) (HCC) 08/23/2022   Cardiogenic shock (HCC) 08/22/2022   NSTEMI (non-ST elevated myocardial infarction) (HCC) 08/20/2022   Hypotension 08/20/2022   Acute metabolic encephalopathy 07/15/2021   Dehydration 07/15/2021   Fall at home, initial encounter 07/15/2021   Tick bite 07/15/2021   Hoarseness of voice 07/08/2021   Dysphagia 07/08/2021   AKI (acute kidney injury) (HCC) 07/02/2021   Constipation 02/18/2021   Intermittent diarrhea 08/17/2020   Barrett's esophagus without dysplasia 08/17/2020   GERD (gastroesophageal reflux disease) 08/17/2020   Heart block AV complete (HCC) 05/12/2020   CAD (coronary artery disease) of artery bypass graft 05/12/2020   Syncope and collapse 05/12/2020   BPH (benign prostatic hyperplasia) 01/26/2015   Dizziness 10/19/2014   Coronary atherosclerosis of native coronary artery    History of atrial fibrillation    Essential hypertension, benign 09/22/2010   Mixed hyperlipidemia 11/28/2008   SLEEP APNEA 11/28/2008    Orientation RESPIRATION BLADDER Height & Weight     Self, Time, Situation, Place  Normal Continent Weight: 203 lb 7.8 oz (92.3 kg) Height:  6' (182.9 cm)  BEHAVIORAL  SYMPTOMS/MOOD NEUROLOGICAL BOWEL NUTRITION STATUS      Continent Diet (see discharge summary)  AMBULATORY STATUS COMMUNICATION OF NEEDS Skin   Limited Assist Verbally Normal                       Personal Care Assistance Level of Assistance  Bathing, Feeding, Dressing Bathing Assistance: Limited assistance Feeding assistance: Limited assistance Dressing Assistance: Limited assistance     Functional Limitations Info             SPECIAL CARE FACTORS FREQUENCY  PT (By licensed PT), OT (By licensed OT)     PT Frequency: 5x weekly OT Frequency: 5x weekly            Contractures      Additional Factors Info  Code Status, Allergies Code Status Info: DNR Allergies Info: Spironolactone           Current Medications (08/24/2022):  This is the current hospital active medication list Current Facility-Administered Medications  Medication Dose Route Frequency Provider Last Rate Last Admin   0.9 %  sodium chloride infusion  250 mL Intravenous Continuous Terrilee Files, MD   Held at 08/20/22 1740   0.9 %  sodium chloride infusion   Intra-arterial PRN Karl Ito, MD       acetaminophen (TYLENOL) tablet 650 mg  650 mg Oral Q6H PRN Elease Etienne, MD       amiodarone (NEXTERONE PREMIX) 360-4.14 MG/200ML-% (1.8 mg/mL) IV infusion  30 mg/hr Intravenous Continuous Karl Ito, MD 16.67 mL/hr at 08/24/22 1410 30 mg/hr at 08/24/22 1410  aspirin EC tablet 81 mg  81 mg Oral Daily Coralyn Helling, MD   81 mg at 08/24/22 1113   atorvastatin (LIPITOR) tablet 80 mg  80 mg Oral QHS Coralyn Helling, MD   80 mg at 08/23/22 2058   carbamazepine (TEGRETOL) tablet 400 mg  400 mg Oral BID Coralyn Helling, MD   400 mg at 08/24/22 1114   Chlorhexidine Gluconate Cloth 2 % PADS 6 each  6 each Topical Daily Coralyn Helling, MD   6 each at 08/24/22 1114   clopidogrel (PLAVIX) tablet 75 mg  75 mg Oral Daily Chandrasekhar, Mahesh A, MD   75 mg at 08/24/22 1113   diclofenac Sodium (VOLTAREN) 1 %  topical gel 4 g  4 g Topical QID Marcellus Scott D, MD   4 g at 08/24/22 1426   divalproex (DEPAKOTE) DR tablet 250 mg  250 mg Oral BID Coralyn Helling, MD   250 mg at 08/24/22 1114   heparin injection 5,000 Units  5,000 Units Subcutaneous Q8H Arrien, York Ram, MD   5,000 Units at 08/24/22 1426   melatonin tablet 3 mg  3 mg Oral QHS Hongalgi, Anand D, MD       milrinone (PRIMACOR) 20 MG/100 ML (0.2 mg/mL) infusion  0.125 mcg/kg/min Intravenous Continuous Andrey Farmer, PA-C 3.65 mL/hr at 08/24/22 1200 0.125 mcg/kg/min at 08/24/22 1200   Oral care mouth rinse  15 mL Mouth Rinse PRN Arrien, York Ram, MD       pantoprazole (PROTONIX) EC tablet 40 mg  40 mg Oral QAC breakfast Coralyn Helling, MD   40 mg at 08/24/22 1113   temazepam (RESTORIL) capsule 15 mg  15 mg Oral QHS Coralie Keens, MD   15 mg at 08/23/22 2058     Discharge Medications: Please see discharge summary for a list of discharge medications.  Relevant Imaging Results:  Relevant Lab Results:   Additional Information  SS: [id number] Reva Bores, LCSWA

## 2022-08-25 DIAGNOSIS — I5023 Acute on chronic systolic (congestive) heart failure: Secondary | ICD-10-CM | POA: Diagnosis not present

## 2022-08-25 DIAGNOSIS — I214 Non-ST elevation (NSTEMI) myocardial infarction: Secondary | ICD-10-CM | POA: Diagnosis not present

## 2022-08-25 DIAGNOSIS — R570 Cardiogenic shock: Secondary | ICD-10-CM | POA: Diagnosis not present

## 2022-08-25 LAB — COOXEMETRY PANEL
Carboxyhemoglobin: 1.1 % (ref 0.5–1.5)
Methemoglobin: <0.7 % (ref 0.0–1.5)
O2 Saturation: 59.5 %
Total hemoglobin: 12.9 g/dL (ref 12.0–16.0)

## 2022-08-25 LAB — CBC
HCT: 36.2 % — ABNORMAL LOW (ref 39.0–52.0)
Hemoglobin: 12.6 g/dL — ABNORMAL LOW (ref 13.0–17.0)
MCH: 31.2 pg (ref 26.0–34.0)
MCHC: 34.8 g/dL (ref 30.0–36.0)
MCV: 89.6 fL (ref 80.0–100.0)
Platelets: 115 10*3/uL — ABNORMAL LOW (ref 150–400)
RBC: 4.04 MIL/uL — ABNORMAL LOW (ref 4.22–5.81)
RDW: 13.2 % (ref 11.5–15.5)
WBC: 10 10*3/uL (ref 4.0–10.5)
nRBC: 0 % (ref 0.0–0.2)

## 2022-08-25 LAB — BASIC METABOLIC PANEL
Anion gap: 14 (ref 5–15)
BUN: 65 mg/dL — ABNORMAL HIGH (ref 8–23)
CO2: 32 mmol/L (ref 22–32)
Calcium: 9 mg/dL (ref 8.9–10.3)
Chloride: 80 mmol/L — ABNORMAL LOW (ref 98–111)
Creatinine, Ser: 1.92 mg/dL — ABNORMAL HIGH (ref 0.61–1.24)
GFR, Estimated: 34 mL/min — ABNORMAL LOW (ref >60)
Glucose, Bld: 100 mg/dL — ABNORMAL HIGH (ref 70–99)
Potassium: 4.2 mmol/L (ref 3.5–5.1)
Sodium: 126 mmol/L — ABNORMAL LOW (ref 135–145)

## 2022-08-25 LAB — CULTURE, BLOOD (ROUTINE X 2)
Culture: NO GROWTH
Culture: NO GROWTH
Special Requests: ADEQUATE
Special Requests: ADEQUATE

## 2022-08-25 LAB — MAGNESIUM: Magnesium: 1.9 mg/dL (ref 1.7–2.4)

## 2022-08-25 MED ORDER — AMIODARONE HCL 200 MG PO TABS
200.0000 mg | ORAL_TABLET | Freq: Two times a day (BID) | ORAL | Status: DC
  Administered 2022-08-25 – 2022-08-26 (×3): 200 mg via ORAL
  Filled 2022-08-25 (×3): qty 1

## 2022-08-25 MED ORDER — FUROSEMIDE 20 MG PO TABS: 20.0000 mg | ORAL_TABLET | Freq: Every day | ORAL | Status: DC

## 2022-08-25 MED ORDER — TORSEMIDE 20 MG PO TABS
20.0000 mg | ORAL_TABLET | Freq: Every day | ORAL | Status: DC
  Administered 2022-08-25: 20 mg via ORAL
  Filled 2022-08-25: qty 1

## 2022-08-25 MED ORDER — SENNOSIDES-DOCUSATE SODIUM 8.6-50 MG PO TABS
1.0000 | ORAL_TABLET | Freq: Two times a day (BID) | ORAL | Status: DC
  Administered 2022-08-25 (×2): 1 via ORAL
  Filled 2022-08-25 (×3): qty 1

## 2022-08-25 MED ORDER — POLYETHYLENE GLYCOL 3350 17 G PO PACK
17.0000 g | PACK | Freq: Every day | ORAL | Status: DC
  Administered 2022-08-25: 17 g via ORAL
  Filled 2022-08-25 (×2): qty 1

## 2022-08-25 MED ORDER — ASPIRIN 81 MG PO CHEW: 81.0000 mg | CHEWABLE_TABLET | ORAL | Status: DC

## 2022-08-25 MED ORDER — SODIUM CHLORIDE 0.9 % IV SOLN: INTRAVENOUS | Status: DC

## 2022-08-25 NOTE — Progress Notes (Signed)
Occupational Therapy Treatment Patient Details Name: Grant Stevens MRN: [id number] DOB: [birth year] Today's Date: 08/25/2022   History of present illness 82 y.o. male presents to [facility] hospital on 08/20/2022 as a transfer from [facility] with diarrhea and recent chest pain. Pt found to have AKI and NSTEMI. PMH includes OA, afib, bladder CA, BPH, HFrEF, CABG, depression, HTN, bipolar, HLD.   OT comments  Patient with functional decline since eval date.  Needing closer to Mod A for lower body ADL, and fatiguing much quicker with moderate R lean in standing.  OT will continue efforts in the acute setting to address deficits, and Patient will benefit from continued inpatient follow up therapy, <3 hours/day       If plan is discharge home, recommend the following:  Assist for transportation;Assistance with cooking/housework;A little help with bathing/dressing/bathroom;A little help with walking and/or transfers   Equipment Recommendations  None recommended by OT    Recommendations for Other Services      Precautions / Restrictions Precautions Precautions: Fall Precaution Comments: A-line Restrictions Weight Bearing Restrictions: No       Mobility Bed Mobility                    Transfers Overall transfer level: Needs assistance Equipment used: Rolling walker (2 wheels) Transfers: Sit to/from Stand Sit to Stand: Min assist                 Balance Overall balance assessment: Needs assistance Sitting-balance support: Feet supported Sitting balance-Leahy Scale: Fair     Standing balance support: Reliant on assistive device for balance Standing balance-Leahy Scale: Poor                             ADL either performed or assessed with clinical judgement   ADL       Grooming: Contact guard assist;Standing           Upper Body Dressing : Minimal assistance;Sitting   Lower Body Dressing: Moderate assistance;Sit to/from stand   Toilet Transfer:  Minimal assistance;Rolling walker (2 wheels);Regular Toilet;Ambulation                  Extremity/Trunk Assessment Upper Extremity Assessment Upper Extremity Assessment: Overall WFL for tasks assessed   Lower Extremity Assessment Lower Extremity Assessment: Defer to PT evaluation   Cervical / Trunk Assessment Cervical / Trunk Assessment: Kyphotic    Vision Patient Visual Report: No change from baseline     Perception Perception Perception: Within Functional Limits   Praxis Praxis Praxis: WFL    Cognition Arousal: Lethargic Behavior During Therapy: WFL for tasks assessed/performed Overall Cognitive Status: Impaired/Different from baseline Area of Impairment: Safety/judgement                         Safety/Judgement: Decreased awareness of safety, Decreased awareness of deficits              Exercises      Shoulder Instructions       General Comments      Pertinent Vitals/ Pain       Pain Assessment Pain Assessment: Faces Faces Pain Scale: Hurts a little bit Pain Location: R knee with ambulation Pain Descriptors / Indicators: Aching Pain Intervention(s): Monitored during session  Frequency  Min 1X/week        Progress Toward Goals  OT Goals(current goals can now be found in the care plan section)  Progress towards OT goals: Progressing toward goals  Acute Rehab OT Goals OT Goal Formulation: With patient Time For Goal Achievement: 09/06/22 Potential to Achieve Goals: Good  Plan      Co-evaluation                 AM-PAC OT "6 Clicks" Daily Activity     Outcome Measure   Help from another person eating meals?: None Help from another person taking care of personal grooming?: A Little Help from another person toileting, which includes using toliet, bedpan, or urinal?: A Little Help from another person bathing (including washing, rinsing,  drying)?: A Lot Help from another person to put on and taking off regular upper body clothing?: A Little Help from another person to put on and taking off regular lower body clothing?: A Lot 6 Click Score: 17    End of Session Equipment Utilized During Treatment: Gait belt;Rolling walker (2 wheels)  OT Visit Diagnosis: Unsteadiness on feet (R26.81);Muscle weakness (generalized) (M62.81)   Activity Tolerance Patient tolerated treatment well   Patient Left in chair;with call bell/phone within reach   Nurse Communication Mobility status        Time: 4098-1191 OT Time Calculation (min): 21 min  Charges: OT General Charges $OT Visit: 1 Visit OT Treatments $Self Care/Home Management : 8-22 mins  08/25/2022  RP, OTR/L  Acute Rehabilitation Services  Office:  [phone]   Suzanna Obey 08/25/2022, 9:44 AM

## 2022-08-25 NOTE — Progress Notes (Signed)
CARDIAC REHAB PHASE I     CRP1 consult received. Mobility needs via OT/PT at this time. Will continue follow and provide appropriate education depending on ongoing treatment plan.   [id number]  Woodroe Chen, RN BSN 08/25/2022 9:41 AM

## 2022-08-25 NOTE — Progress Notes (Addendum)
Advanced Heart Failure Rounding Note  PCP-Cardiologist: Nona Dell, MD   Subjective:    CO-OX 60% on milrinone 0.125.  Scr down to 1.9.  Feeling much better today. Less nauseated. Has already been up walking with OT.   Objective:   Weight Range: 92.1 kg Body mass index is 27.54 kg/m.   Vital Signs:   Temp:  [96.6 F (35.9 C)-98.2 F (36.8 C)] 96.6 F (35.9 C) (08/15 0800) Pulse Rate:  [64-81] 65 (08/15 0400) Resp:  [11-31] 12 (08/15 1000) BP: (88-130)/(55-89) 119/78 (08/15 1000) SpO2:  [92 %-100 %] 95 % (08/15 1000) Weight:  [92.1 kg] 92.1 kg (08/15 0500) Last BM Date : 08/23/22  Weight change: Filed Weights   08/22/22 0500 08/24/22 0500 08/25/22 0500  Weight: 95.2 kg 92.3 kg 92.1 kg    Intake/Output:   Intake/Output Summary (Last 24 hours) at 08/25/2022 1027 Last data filed at 08/25/2022 0800 Gross per 24 hour  Intake 915.24 ml  Output 2225 ml  Net -1309.76 ml      Physical Exam    General:  Sitting up in chair. Fatigued appearing. HEENT: normal Neck: supple. LIJ CVC Cor: PMI nondisplaced. Regular rate & rhythm. No rubs, gallops or murmurs. Lungs: clear Abdomen: soft, nontender, nondistended.  Extremities: no cyanosis, clubbing, rash, edema Neuro: alert & orientedx3. Affect pleasant      Telemetry   V paced 60s  Labs    CBC Recent Labs    08/24/22 1514 08/25/22 0429  WBC 13.7* 10.0  HGB 12.8* 12.6*  HCT 37.1* 36.2*  MCV 88.5 89.6  PLT 123* 115*   Basic Metabolic Panel Recent Labs    16/10/96 2328 08/23/22 0445 08/24/22 0408 08/24/22 2151 08/25/22 0429  NA 126*   < > 125* 125* 126*  K 4.9   < > 3.9 4.2 4.2  CL 87*   < > 81* 79* 80*  CO2 29   < > 30 30 32  GLUCOSE 101*   < > 95 160* 100*  BUN 66*   < > 65* 64* 65*  CREATININE 2.49*   < > 2.16* 2.04* 1.92*  CALCIUM 8.8*   < > 9.0 8.9 9.0  MG 2.1   < > 1.9  --  1.9  PHOS 4.2  --   --   --   --    < > = values in this interval not displayed.   Liver Function  Tests Recent Labs    08/24/22 2151  AST 22  ALT 38  ALKPHOS 83  BILITOT 0.5  PROT 6.6  ALBUMIN 3.6    No results for input(s): "LIPASE", "AMYLASE" in the last 72 hours.  Cardiac Enzymes No results for input(s): "CKTOTAL", "CKMB", "CKMBINDEX", "TROPONINI" in the last 72 hours.  BNP: BNP (last 3 results) Recent Labs    08/21/22 0441  BNP 2,520.5*    ProBNP (last 3 results) No results for input(s): "PROBNP" in the last 8760 hours.   D-Dimer No results for input(s): "DDIMER" in the last 72 hours. Hemoglobin A1C No results for input(s): "HGBA1C" in the last 72 hours.  Fasting Lipid Panel No results for input(s): "CHOL", "HDL", "LDLCALC", "TRIG", "CHOLHDL", "LDLDIRECT" in the last 72 hours.  Thyroid Function Tests No results for input(s): "TSH", "T4TOTAL", "T3FREE", "THYROIDAB" in the last 72 hours.  Invalid input(s): "FREET3"   Other results:   Imaging    No results found.   Medications:     Scheduled Medications:  aspirin EC  81  mg Oral Daily   atorvastatin  80 mg Oral QHS   carbamazepine  400 mg Oral BID   Chlorhexidine Gluconate Cloth  6 each Topical Daily   clopidogrel  75 mg Oral Daily   diclofenac Sodium  4 g Topical QID   divalproex  250 mg Oral BID   heparin injection (subcutaneous)  5,000 Units Subcutaneous Q8H   melatonin  3 mg Oral QHS   pantoprazole  40 mg Oral QAC breakfast   polyethylene glycol  17 g Oral Daily   senna-docusate  1 tablet Oral BID   temazepam  15 mg Oral QHS    Infusions:  sodium chloride Stopped (08/20/22 1740)   sodium chloride     amiodarone 30 mg/hr (08/25/22 0800)   milrinone Stopped (08/25/22 0945)    PRN Medications: Place/Maintain arterial line **AND** sodium chloride, acetaminophen, ondansetron (ZOFRAN) IV, mouth rinse   Assessment/Plan    1. Acute on chronic systolic HF -> cardiogenic shock - In setting of OOH infarct - Echo 2/24 EF 25-30% RV mildly reduced - Echo 08/21/22: EF < 20% RV severely  reduced - Initial co-ox 31% - CO-OX 60% on milrinone 0.125. Stop milrinone today. - Diuresed well. Down total of 13 lb. Switch to po Torsemide 20 mg daily. - Hopefully renal function will improve enough next couple of days to add back GDMT - Consider low dose beta blocker prior to discharge given recent MI - Not candidate for advanced therapies with age - [facility] tomorrow. - TED hose   2. CAD with possible NSTEMI - h/o CAD s/p CABG  - suspect OOH infarct with loss of SVG - HS troponin > 13K - had heparin x 48 hours - DAPT/statin  - Potentially add beta blocker prior to discharge - LHC on 08/16   3. Ventricular tachycardia - No recurrence  - Off lidocaine - Switch IV amiodarone to PO - Keep K > 4.0 Mg > 2.0   4. AKI - due to shock/ATN - baseline Scr 1.1 , peaked at 3.8. Now trending down. 1.9 today - Renal has seen   5. SSS - s/p pacer  6. Remote history of atrial fibrillation - Has not been anticoagulated - No recurrence this admission. Would need anticoagulation if recurs.  7. Hyponatremia - restrict FW - Na 126 today - will use tolvapatan if < 125   8. Limited DNR - Spoke with him in detail about GOC wishes - Ok with intubation and defibrillation  - Would not want CPR  Has transfer orders to [facility].  SNF for rehab at discharge   Length of Stay: 5  [facility], LINDSAY N, PA-C  08/25/2022, 10:27 AM  Advanced Heart Failure Team Pager [phone] (M-F; 7a - 5p)  Please contact [facility] for night-coverage after hours (5p -7a ) and weekends on [email or web address]  Patient seen and examined with the above-signed Advanced Practice Provider and/or Housestaff. I personally reviewed laboratory data, imaging studies and relevant notes. I independently examined the patient and formulated the important aspects of the plan. I have edited the note to reflect any of my changes or salient points. I have personally discussed the plan with the patient and/or family.  Feels good this am.  Denies CP or SOB. On milrinone 0.125. Co-ox 60%. Volume status looks good. SCr improving.   General:  Elderly. No resp difficulty HEENT: normal Neck: supple. no JVD. Carotids 2+ bilat; no bruits. No lymphadenopathy or thryomegaly appreciated. Cor: PMI nondisplaced. Regular rate & rhythm. No rubs, gallops  or murmurs. Lungs: clear Abdomen: soft, nontender, nondistended. No hepatosplenomegaly. No bruits or masses. Good bowel sounds. Extremities: no cyanosis, clubbing, rash, edema Neuro: alert & orientedx3, cranial nerves grossly intact. moves all 4 extremities w/o difficulty. Affect pleasant  Stop milrinone. Plan L/R cath tomorrow. Switch amio to po. Can go to floor. Will need SNF on d/c.   Arvilla Meres, MD  1:56 PM

## 2022-08-25 NOTE — TOC Progression Note (Signed)
Transition of Care [facility]) - Progression Note    Patient Details  Name: Grant Stevens MRN: [id number] Date of Birth: [birth year]  Transition of Care [facility]) CM/SW Contact  Nicanor Bake Phone Number: [phone] 08/25/2022, 3:27 PM  Clinical Narrative:  CSW met with the pt at bedside. CSW presented the list of SNF options to the pt. Pt stated that he would like to go to [facility]. Pt stated that this is closer to his home and would feel most comfortable. CSW discussed next steps with pt. Pt will need PTAR at dc.      Expected Discharge Plan: Skilled Nursing Facility Barriers to Discharge: Continued Medical Work up  Expected Discharge Plan and Services In-house Referral: Clinical Social Work Discharge Planning Services: CM Consult Post Acute Care Choice: Skilled Nursing Facility Living arrangements for the past 2 months: Single Family Home                                       Social Determinants of Health (SDOH) Interventions SDOH Screenings   Food Insecurity: No Food Insecurity (08/24/2022)  Housing: Low Risk  (08/24/2022)  Transportation Needs: No Transportation Needs (08/24/2022)  Utilities: Not At Risk (08/24/2022)  Tobacco Use: Medium Risk (08/20/2022)    Readmission Risk Interventions     No data to display

## 2022-08-25 NOTE — TOC Progression Note (Signed)
Transition of Care [facility]) - Progression Note    Patient Details  Name: Grant Stevens MRN: [id number] Date of Birth: [birth year]  Transition [facility] Care [facility]) CM/SW Contact  Reva Bores, LCSWA Phone Number: 08/25/2022, 9:13 AM  Clinical Narrative:  HF CSW contacted [facility] SNF and left a voicemail on the admissions line asking to be contacted back in reference to the SNF referral that was sent yesterday. CSW will continue to follow up. TOC will continue following.      Expected Discharge Plan: Skilled Nursing Facility Barriers to Discharge: Continued Medical Work up  Expected Discharge Plan and Services In-house Referral: Clinical Social Work Discharge Planning Services: CM Consult Post Acute Care Choice: Skilled Nursing Facility Living arrangements for the past 2 months: Single Family Home                                       Social Determinants of Health (SDOH) Interventions SDOH Screenings   Food Insecurity: No Food Insecurity (08/24/2022)  Housing: Low Risk  (08/24/2022)  Transportation Needs: No Transportation Needs (08/24/2022)  Utilities: Not At Risk (08/24/2022)  Tobacco Use: Medium Risk (08/20/2022)    Readmission Risk Interventions     No data to display

## 2022-08-25 NOTE — Plan of Care (Signed)
  Problem: Activity: Goal: Risk for activity intolerance will decrease Outcome: Progressing   Problem: Coping: Goal: Level of anxiety will decrease Outcome: Progressing   Problem: Pain Managment: Goal: General experience of comfort will improve Outcome: Progressing   

## 2022-08-25 NOTE — Plan of Care (Signed)

## 2022-08-26 ENCOUNTER — Encounter (HOSPITAL_COMMUNITY)
Admission: EM | Disposition: A | Discharge status: Hospice | Payer: Self-pay | Source: Home / Self Care | Attending: Internal Medicine

## 2022-08-26 DIAGNOSIS — I2510 Atherosclerotic heart disease of native coronary artery without angina pectoris: Secondary | ICD-10-CM

## 2022-08-26 DIAGNOSIS — I214 Non-ST elevation (NSTEMI) myocardial infarction: Secondary | ICD-10-CM | POA: Diagnosis not present

## 2022-08-26 DIAGNOSIS — I5023 Acute on chronic systolic (congestive) heart failure: Secondary | ICD-10-CM | POA: Diagnosis not present

## 2022-08-26 DIAGNOSIS — R570 Cardiogenic shock: Secondary | ICD-10-CM | POA: Diagnosis not present

## 2022-08-26 HISTORY — PX: RIGHT/LEFT HEART CATH AND CORONARY/GRAFT ANGIOGRAPHY: CATH118267

## 2022-08-26 LAB — CBC
HCT: 36.4 % — ABNORMAL LOW (ref 39.0–52.0)
HCT: 38.7 % — ABNORMAL LOW (ref 39.0–52.0)
Hemoglobin: 12.5 g/dL — ABNORMAL LOW (ref 13.0–17.0)
Hemoglobin: 13.2 g/dL (ref 13.0–17.0)
MCH: 31 pg (ref 26.0–34.0)
MCH: 30.3 pg (ref 26.0–34.0)
MCHC: 34.3 g/dL (ref 30.0–36.0)
MCHC: 34.1 g/dL (ref 30.0–36.0)
MCV: 90.3 fL (ref 80.0–100.0)
MCV: 89 fL (ref 80.0–100.0)
Platelets: 110 10*3/uL — ABNORMAL LOW (ref 150–400)
Platelets: 99 10*3/uL — ABNORMAL LOW (ref 150–400)
RBC: 4.03 MIL/uL — ABNORMAL LOW (ref 4.22–5.81)
RBC: 4.35 MIL/uL (ref 4.22–5.81)
RDW: 13.3 % (ref 11.5–15.5)
RDW: 13.2 % (ref 11.5–15.5)
WBC: 9 10*3/uL (ref 4.0–10.5)
WBC: 10.8 10*3/uL — ABNORMAL HIGH (ref 4.0–10.5)
nRBC: 0 % (ref 0.0–0.2)
nRBC: 0 % (ref 0.0–0.2)

## 2022-08-26 LAB — POCT I-STAT EG7
Acid-Base Excess: 8 mmol/L — ABNORMAL HIGH (ref 0.0–2.0)
Acid-Base Excess: 8 mmol/L — ABNORMAL HIGH (ref 0.0–2.0)
Bicarbonate: 34.4 mmol/L — ABNORMAL HIGH (ref 20.0–28.0)
Bicarbonate: 34.5 mmol/L — ABNORMAL HIGH (ref 20.0–28.0)
Calcium, Ion: 1.13 mmol/L — ABNORMAL LOW (ref 1.15–1.40)
Calcium, Ion: 1.14 mmol/L — ABNORMAL LOW (ref 1.15–1.40)
HCT: 40 % (ref 39.0–52.0)
HCT: 40 % (ref 39.0–52.0)
Hemoglobin: 13.6 g/dL (ref 13.0–17.0)
Hemoglobin: 13.6 g/dL (ref 13.0–17.0)
O2 Saturation: 62 %
O2 Saturation: 65 %
Potassium: 4.3 mmol/L (ref 3.5–5.1)
Potassium: 4.3 mmol/L (ref 3.5–5.1)
Sodium: 121 mmol/L — ABNORMAL LOW (ref 135–145)
Sodium: 122 mmol/L — ABNORMAL LOW (ref 135–145)
TCO2: 36 mmol/L — ABNORMAL HIGH (ref 22–32)
TCO2: 36 mmol/L — ABNORMAL HIGH (ref 22–32)
pCO2, Ven: 52.4 mmHg (ref 44–60)
pCO2, Ven: 53.4 mmHg (ref 44–60)
pH, Ven: 7.418 (ref 7.25–7.43)
pH, Ven: 7.425 (ref 7.25–7.43)
pO2, Ven: 32 mmHg (ref 32–45)
pO2, Ven: 34 mmHg (ref 32–45)

## 2022-08-26 LAB — BASIC METABOLIC PANEL
Anion gap: 14 (ref 5–15)
BUN: 68 mg/dL — ABNORMAL HIGH (ref 8–23)
CO2: 30 mmol/L (ref 22–32)
Calcium: 8.6 mg/dL — ABNORMAL LOW (ref 8.9–10.3)
Chloride: 81 mmol/L — ABNORMAL LOW (ref 98–111)
Creatinine, Ser: 1.97 mg/dL — ABNORMAL HIGH (ref 0.61–1.24)
GFR, Estimated: 33 mL/min — ABNORMAL LOW (ref >60)
Glucose, Bld: 87 mg/dL (ref 70–99)
Potassium: 3.8 mmol/L (ref 3.5–5.1)
Sodium: 125 mmol/L — ABNORMAL LOW (ref 135–145)

## 2022-08-26 LAB — POCT I-STAT 7, (LYTES, BLD GAS, ICA,H+H)
Acid-Base Excess: 6 mmol/L — ABNORMAL HIGH (ref 0.0–2.0)
Bicarbonate: 30 mmol/L — ABNORMAL HIGH (ref 20.0–28.0)
Calcium, Ion: 1.02 mmol/L — ABNORMAL LOW (ref 1.15–1.40)
HCT: 38 % — ABNORMAL LOW (ref 39.0–52.0)
Hemoglobin: 12.9 g/dL — ABNORMAL LOW (ref 13.0–17.0)
O2 Saturation: 97 %
Potassium: 3.9 mmol/L (ref 3.5–5.1)
Sodium: 124 mmol/L — ABNORMAL LOW (ref 135–145)
TCO2: 31 mmol/L (ref 22–32)
pCO2 arterial: 41.2 mmHg (ref 32–48)
pH, Arterial: 7.471 — ABNORMAL HIGH (ref 7.35–7.45)
pO2, Arterial: 81 mmHg — ABNORMAL LOW (ref 83–108)

## 2022-08-26 LAB — LIDOCAINE LEVEL: Lidocaine Lvl: 1.6 ug/mL (ref 1.5–5.0)

## 2022-08-26 LAB — COOXEMETRY PANEL
Carboxyhemoglobin: 1.5 % (ref 0.5–1.5)
Methemoglobin: <0.7 % (ref 0.0–1.5)
O2 Saturation: 58.7 %
Total hemoglobin: 12.9 g/dL (ref 12.0–16.0)

## 2022-08-26 LAB — CREATININE, SERUM
Creatinine, Ser: 1.91 mg/dL — ABNORMAL HIGH (ref 0.61–1.24)
GFR, Estimated: 35 mL/min — ABNORMAL LOW (ref >60)

## 2022-08-26 LAB — MAGNESIUM: Magnesium: 1.9 mg/dL (ref 1.7–2.4)

## 2022-08-26 SURGERY — RIGHT/LEFT HEART CATH AND CORONARY/GRAFT ANGIOGRAPHY
Anesthesia: LOCAL

## 2022-08-26 MED ORDER — HEPARIN (PORCINE) IN NACL 1000-0.9 UT/500ML-% IV SOLN
INTRAVENOUS | Status: DC | PRN
  Administered 2022-08-26 (×2): 500 mL

## 2022-08-26 MED ORDER — IOHEXOL 350 MG/ML SOLN
INTRAVENOUS | Status: DC | PRN
  Administered 2022-08-26: 60 mL

## 2022-08-26 MED ORDER — POLYETHYLENE GLYCOL 3350 17 G PO PACK
17.0000 g | PACK | Freq: Two times a day (BID) | ORAL | Status: DC
  Administered 2022-08-26 – 2022-09-02 (×10): 17 g via ORAL
  Filled 2022-08-26 (×10): qty 1

## 2022-08-26 MED ORDER — TEMAZEPAM 15 MG PO CAPS: 15.0000 mg | ORAL_CAPSULE | Freq: Every evening | ORAL | 0 refills | Status: DC | PRN

## 2022-08-26 MED ORDER — SODIUM CHLORIDE 0.9% FLUSH
3.0000 mL | INTRAVENOUS | Status: DC | PRN
  Administered 2022-08-27 (×2): 10 mL via INTRAVENOUS

## 2022-08-26 MED ORDER — SODIUM CHLORIDE 0.9% FLUSH
3.0000 mL | Freq: Two times a day (BID) | INTRAVENOUS | Status: DC
  Administered 2022-08-27: 10 mL via INTRAVENOUS
  Administered 2022-08-28 (×2): 3 mL via INTRAVENOUS

## 2022-08-26 MED ORDER — FENTANYL CITRATE PF 50 MCG/ML IJ SOSY
PREFILLED_SYRINGE | INTRAMUSCULAR | Status: AC
  Filled 2022-08-26: qty 1

## 2022-08-26 MED ORDER — MELATONIN 3 MG PO TABS: 3.0000 mg | ORAL_TABLET | Freq: Every day | ORAL | Status: DC

## 2022-08-26 MED ORDER — MIDAZOLAM HCL 2 MG/2ML IJ SOLN
INTRAMUSCULAR | Status: AC
  Filled 2022-08-26: qty 2

## 2022-08-26 MED ORDER — POTASSIUM CHLORIDE CRYS ER 20 MEQ PO TBCR
20.0000 meq | EXTENDED_RELEASE_TABLET | Freq: Once | ORAL | Status: AC
  Administered 2022-08-26: 20 meq via ORAL
  Filled 2022-08-26: qty 1

## 2022-08-26 MED ORDER — AMIODARONE HCL IN DEXTROSE 360-4.14 MG/200ML-% IV SOLN
60.0000 mg/h | INTRAVENOUS | Status: DC
  Administered 2022-08-26 – 2022-08-27 (×3): 30 mg/h via INTRAVENOUS
  Administered 2022-08-28: 60 mg/h via INTRAVENOUS
  Administered 2022-08-28: 30 mg/h via INTRAVENOUS
  Administered 2022-08-28 – 2022-08-30 (×7): 60 mg/h via INTRAVENOUS
  Filled 2022-08-26 (×12): qty 200

## 2022-08-26 MED ORDER — SORBITOL 70 % SOLN: 30.0000 mL | Freq: Once | Status: DC

## 2022-08-26 MED ORDER — SENNOSIDES-DOCUSATE SODIUM 8.6-50 MG PO TABS
2.0000 | ORAL_TABLET | Freq: Two times a day (BID) | ORAL | Status: DC
  Administered 2022-08-26 – 2022-08-29 (×7): 2 via ORAL
  Filled 2022-08-26 (×6): qty 2

## 2022-08-26 MED ORDER — LIDOCAINE HCL (PF) 1 % IJ SOLN
INTRAMUSCULAR | Status: DC | PRN
  Administered 2022-08-26: 12 mL

## 2022-08-26 MED ORDER — AMIODARONE HCL IN DEXTROSE 360-4.14 MG/200ML-% IV SOLN
60.0000 mg/h | INTRAVENOUS | Status: AC
  Administered 2022-08-26: 60 mg/h via INTRAVENOUS
  Filled 2022-08-26: qty 200

## 2022-08-26 MED ORDER — AMIODARONE HCL 200 MG PO TABS: 200.0000 mg | ORAL_TABLET | Freq: Two times a day (BID) | ORAL | Status: DC

## 2022-08-26 MED ORDER — AMIODARONE HCL IN DEXTROSE 360-4.14 MG/200ML-% IV SOLN
INTRAVENOUS | Status: AC
  Filled 2022-08-26: qty 200

## 2022-08-26 MED ORDER — SENNOSIDES-DOCUSATE SODIUM 8.6-50 MG PO TABS: 2.0000 | ORAL_TABLET | Freq: Every day | ORAL | Status: DC

## 2022-08-26 MED ORDER — CLOPIDOGREL BISULFATE 75 MG PO TABS: 75.0000 mg | ORAL_TABLET | Freq: Every day | ORAL | Status: DC

## 2022-08-26 MED ORDER — SODIUM CHLORIDE 0.9 % IV SOLN
INTRAVENOUS | Status: AC | PRN
  Administered 2022-08-26: 10 mL/h via INTRAVENOUS

## 2022-08-26 MED ORDER — TORSEMIDE 20 MG PO TABS: 20.0000 mg | ORAL_TABLET | Freq: Every day | ORAL | Status: DC

## 2022-08-26 MED ORDER — LIDOCAINE HCL (PF) 1 % IJ SOLN
INTRAMUSCULAR | Status: AC
  Filled 2022-08-26: qty 30

## 2022-08-26 MED ORDER — BISACODYL 10 MG RE SUPP: 10.0000 mg | Freq: Once | RECTAL | Status: DC

## 2022-08-26 MED ORDER — MAGNESIUM SULFATE 2 GM/50ML IV SOLN
2.0000 g | Freq: Once | INTRAVENOUS | Status: AC
  Administered 2022-08-26: 2 g via INTRAVENOUS
  Filled 2022-08-26: qty 50

## 2022-08-26 MED ORDER — SODIUM CHLORIDE 0.9 % IV SOLN: 250.0000 mL | INTRAVENOUS | Status: DC | PRN

## 2022-08-26 MED ORDER — POLYETHYLENE GLYCOL 3350 17 G PO PACK: 17.0000 g | PACK | Freq: Two times a day (BID) | ORAL | Status: DC

## 2022-08-26 MED ORDER — AMIODARONE LOAD VIA INFUSION
150.0000 mg | Freq: Once | INTRAVENOUS | Status: AC
  Administered 2022-08-26: 150 mg via INTRAVENOUS
  Filled 2022-08-26: qty 83.34

## 2022-08-26 MED ORDER — SODIUM CHLORIDE 0.9 % WEIGHT BASED INFUSION
1.0000 mL/kg/h | INTRAVENOUS | Status: AC
  Administered 2022-08-26: 1 mL/kg/h via INTRAVENOUS

## 2022-08-26 MED ORDER — HEPARIN SODIUM (PORCINE) 5000 UNIT/ML IJ SOLN
5000.0000 [IU] | Freq: Three times a day (TID) | INTRAMUSCULAR | Status: DC
  Administered 2022-08-27 – 2022-08-30 (×10): 5000 [IU] via SUBCUTANEOUS
  Filled 2022-08-26 (×10): qty 1

## 2022-08-26 SURGICAL SUPPLY — 13 items
CATH INFINITI 5FR MULTPACK ANG (CATHETERS) IMPLANT
CATH SWAN GANZ 7F STRAIGHT (CATHETERS) IMPLANT
ELECT DEFIB PAD ADLT CADENCE (PAD) IMPLANT
KIT HEART LEFT (KITS) ×1 IMPLANT
KIT MICROPUNCTURE NIT STIFF (SHEATH) IMPLANT
PACK CARDIAC CATHETERIZATION (CUSTOM PROCEDURE TRAY) ×1 IMPLANT
SHEATH PINNACLE 5F 10CM (SHEATH) IMPLANT
SHEATH PINNACLE 7F 10CM (SHEATH) IMPLANT
SHEATH PROBE COVER 6X72 (BAG) IMPLANT
TRANSDUCER W/STOPCOCK (MISCELLANEOUS) ×1 IMPLANT
TUBING CIL FLEX 10 FLL-RA (TUBING) ×1 IMPLANT
WIRE EMERALD 3MM-J .035X150CM (WIRE) IMPLANT
WIRE EMERALD ST .035X150CM (WIRE) IMPLANT

## 2022-08-26 NOTE — TOC Progression Note (Addendum)
Transition of Care [facility]) - Progression Note    Patient Details  Name: Grant Stevens MRN: [id number] Date of Birth: 16-Aug-1940  Transition of Care [facility]) CM/SW Contact  Reva Bores, LCSWA Phone Number:[phone] 08/26/2022, 9:04 AM  Clinical Narrative:  CSW spoke with [facility] SNF admissions [phone] who stated that they do have a abed available. She stated that they only need the dc summary by 12pm and he has to arrive by 5pm. CSW stated that she would consult with the team at rounds and share updates.   AUTH # [id number], 08/26/2022-08/30/2022.   TOC will continue following.     Expected Discharge Plan: Skilled Nursing Facility Barriers to Discharge: Continued Medical Work up  Expected Discharge Plan and Services In-house Referral: Clinical Social Work Discharge Planning Services: CM Consult Post Acute Care Choice: Skilled Nursing Facility Living arrangements for the past 2 months: Single Family Home                                       Social Determinants of Health (SDOH) Interventions SDOH Screenings   Food Insecurity: No Food Insecurity (08/24/2022)  Housing: Low Risk  (08/24/2022)  Transportation Needs: No Transportation Needs (08/24/2022)  Utilities: Not At Risk (08/24/2022)  Tobacco Use: Medium Risk (08/20/2022)    Readmission Risk Interventions     No data to display

## 2022-08-26 NOTE — Discharge Summary (Addendum)
Physician Discharge Summary  Grant Stevens IHK:[id number] DOB: 04/07/1940 DOA: 08/20/2022  PCP: Grant Perches, MD  Admit date: 08/20/2022 Discharge date: 08/26/2022  Time spent: 35 minutes  Recommendations for Outpatient Follow-up:  Cardiology, advanced heart failure clinic in 2 to 3 weeks BMP in 3-4days, to evaluate Sodium and Creatinine Titrate off laxatives slowly    Discharge Diagnoses:  Principal Problem:   NSTEMI (non-ST elevated myocardial infarction) (HCC) Acute systolic CHF Cardiogenic shock   Acute on chronic systolic CHF (congestive heart failure) (HCC)   AKI (acute kidney injury) (HCC)   History of atrial fibrillation   GERD (gastroesophageal reflux disease) Sick sinus syndrome with pacer LIMITED CODE   Discharge Condition: Improved  Diet recommendation:  low-sodium, heart healthy  Filed Weights   08/24/22 0500 08/25/22 0500 08/26/22 0500  Weight: 92.3 kg 92.1 kg 91.8 kg    History of present illness:  82 year old male with PMH of CAD, s/p CABG, chronic systolic CHF, s/p PPM, presented to ED on 08/20/2022 with complaints of diarrhea for 24 hours. Also had intermittent chest pain with associated dyspnea and diaphoresis. Hypotensive in the ED to 87/75 and hypoxic with oxygen saturation of 82%. Admitted for acute on chronic systolic heart failure with cardiogenic shock, CAD with possible NSTEMI complicated by acute kidney injury.   Hospital Course:    Acute on chronic systolic HF -> cardiogenic shock -Suspected to have out-of-hospital infarct - Echo 2/24 EF 25-30% RV mildly reduced - Echo 08/21/22: EF < 20% RV severely reduced -Followed by heart failure team, started on milrinone and Lasix infusion -Volume status has improved, weight down 14 LB, to 202 pounds at discharge -Milrinone discontinued yesterday -GDMT limited by AKI and soft blood pressures -Cleared by heart failure team for discharge to SNF today on torsemide 20 Mg daily -Left heart cath deferred in  the setting of persistent CKD and lack of symptoms -Discharge to SNF for short-term rehab, close follow-up with advanced heart failure clinic in 2 to 3 weeks   CAD/NSTEMI - h/o CAD s/p CABG  -Treated with IV heparin for 48 hours -Now stable on aspirin, Plavix and statin -Left heart cath was originally scheduled for today however in the absence of ACS symptoms and persistent CKD, plan made for medical management   Ventricular tachycardia -No further recurrence, maintained on oral amiodarone   AKI -Due to ATN in the setting of cardiogenic shock - baseline Scr 1.1 , peaked at 3.8. Now trending down. 1.9 today  SSS - s/p pacer   Remote history of atrial fibrillation - Has not been anticoagulated - No recurrence this admission. Would need anticoagulation if recurs.    Hyponatremia -Sodium 125, mental status is stable -Suspect secondary to hypervolemia and diuretics -Recheck sodium in 3 to 4 days   Limited DNR -Patient does not want CPR, okay with intubation and defibrillation   Frailty, deconditioning  -Plan for SNF at discharge  Constipation -Add MiraLAX and Senokot daily for now, titrate down in a few days  Discharge Exam: Vitals:   08/26/22 1000 08/26/22 1103  BP: 94/61   Pulse:    Resp:    Temp:  97.7 F (36.5 C)  SpO2: 95%    Gen: Awake, Alert, Oriented X 3,  HEENT: left internal jugular catheter Lungs: Good air movement bilaterally, CTAB CVS: S1S2/RRR Abd: soft, Non tender, non distended, BS present Extremities: No edema Skin: no new rashes on exposed skin   Discharge Instructions    Allergies as of 08/26/2022  Reactions   Spironolactone Other (See Comments)   Hyperkalemia        Medication List     STOP taking these medications    bisoprolol 5 MG tablet Commonly known as: ZEBETA   empagliflozin 10 MG Tabs tablet Commonly known as: Jardiance   Entresto 24-26 MG Generic drug: sacubitril-valsartan   furosemide 40 MG tablet Commonly  known as: Lasix   LORazepam 0.5 MG tablet Commonly known as: ATIVAN   silodosin 8 MG Caps capsule Commonly known as: RAPAFLO       TAKE these medications    amiodarone 200 MG tablet Commonly known as: PACERONE Take 1 tablet (200 mg total) by mouth 2 (two) times daily.   aspirin EC 81 MG tablet Take 1 tablet (81 mg total) by mouth daily.   atorvastatin 80 MG tablet Commonly known as: LIPITOR Take 1 tablet (80 mg total) by mouth at bedtime.   carbamazepine 200 MG tablet Commonly known as: TEGRETOL Take 400 mg by mouth 2 (two) times daily.   clopidogrel 75 MG tablet Commonly known as: PLAVIX Take 1 tablet (75 mg total) by mouth daily. Start taking on: August 27, 2022   divalproex 250 MG DR tablet Commonly known as: DEPAKOTE Take 250 mg by mouth 2 (two) times daily.   EPINEPHrine 0.3 mg/0.3 mL Soaj injection Commonly known as: EPI-PEN Inject 0.3 mg into the muscle as needed for anaphylaxis.   famotidine 40 MG tablet Commonly known as: PEPCID TAKE (1) TABLET BY MOUTH AT BEDTIME.   melatonin 3 MG Tabs tablet Take 1 tablet (3 mg total) by mouth at bedtime.   pantoprazole 40 MG tablet Commonly known as: PROTONIX Take 1 tablet (40 mg total) by mouth daily before breakfast.   polyethylene glycol 17 g packet Commonly known as: MIRALAX / GLYCOLAX Take 17 g by mouth 2 (two) times daily for 4 days.   senna-docusate 8.6-50 MG tablet Commonly known as: Senokot-S Take 2 tablets by mouth at bedtime.   SUPER B COMPLEX PO Take 1 capsule by mouth daily.   temazepam 15 MG capsule Commonly known as: RESTORIL Take 15 mg by mouth at bedtime as needed for sleep.   torsemide 20 MG tablet Commonly known as: DEMADEX Take 1 tablet (20 mg total) by mouth daily.       Allergies  Allergen Reactions   Spironolactone Other (See Comments)    Hyperkalemia      The results of significant diagnostics from this hospitalization (including imaging, microbiology, ancillary and  laboratory) are listed below for reference.    Significant Diagnostic Studies: ECHOCARDIOGRAM COMPLETE  Result Date: 08/21/2022    ECHOCARDIOGRAM REPORT   Patient Name:   Grant Stevens Date of Exam: 08/21/2022 Medical Rec #:  [id number]        Height:       72.0 in Accession #:    [id number]       Weight:       214.7 lb Date of Birth:  03/07/1940         BSA:          2.196 m Patient Age:    82 years         BP:           90/71 mmHg Patient Gender: M                HR:           60 bpm. Exam Location:  Inpatient Procedure: 2D Echo,  Cardiac Doppler, Color Doppler and Intracardiac            Opacification Agent STAT ECHO REPORT CONTAINS CRITICAL RESULT Indications:    I50.40* Unspecified combined systolic (congestive) and diastolic                 (congestive) heart failure  History:        Patient has prior history of Echocardiogram examinations, most                 recent 03/03/2022. CHF, CAD, Previous Myocardial Infarction and                 Acute MI, Abnormal ECG, Prior CABG and Pacemaker,                 Arrythmias:Heart block and Atrial Fibrillation,                 Signs/Symptoms:Syncope, Dizziness/Lightheadedness and                 Hypotension; Risk Factors:Former Smoker and Sleep Apnea.  Sonographer:    Sheralyn Boatman RDCS Referring Phys: [id number] [facility] A CHANDRASEKHAR IMPRESSIONS  1. Left ventricular ejection fraction, by estimation, is <20%. The left ventricle has severely decreased function. The left ventricle demonstrates regional wall motion abnormalities (see scoring diagram/findings for description). The left ventricular internal cavity size was mildly to moderately dilated. Left ventricular diastolic parameters are consistent with Grade III diastolic dysfunction (restrictive).  2. Right ventricular systolic function is severely reduced. The right ventricular size is mildly enlarged. There is mildly elevated pulmonary artery systolic pressure. The estimated right ventricular systolic pressure is 38.9  mmHg.  3. The mitral valve is degenerative. Trivial mitral valve regurgitation. No evidence of mitral stenosis.  4. The aortic valve is tricuspid. There is moderate calcification of the aortic valve. Aortic valve regurgitation is not visualized. Aortic valve sclerosis is present, with no evidence of aortic valve stenosis.  5. The inferior vena cava is dilated in size with >50% respiratory variability, suggesting right atrial pressure of 8 mmHg. Comparison(s): Prior images reviewed side by side. LVEF is further decreased. No LV thrombus noted. FINDINGS  Left Ventricle: Left ventricular ejection fraction, by estimation, is <20%. The left ventricle has severely decreased function. The left ventricle demonstrates regional wall motion abnormalities. Definity contrast agent was given IV to delineate the left ventricular endocardial borders. The left ventricular internal cavity size was mildly to moderately dilated. There is no left ventricular hypertrophy. Left ventricular diastolic parameters are consistent with Grade III diastolic dysfunction (restrictive).  LV Wall Scoring: The apical lateral segment, apical septal segment, apical anterior segment, and apical inferior segment are aneurysmal. The mid anteroseptal segment, mid inferolateral segment, mid anterolateral segment, mid inferoseptal segment, mid anterior segment, and mid inferior segment are akinetic. The basal anteroseptal segment, basal inferolateral segment, basal anterolateral segment, basal anterior segment, basal inferior segment, and basal inferoseptal segment are hypokinetic. Right Ventricle: The right ventricular size is mildly enlarged. No increase in right ventricular wall thickness. Right ventricular systolic function is severely reduced. There is mildly elevated pulmonary artery systolic pressure. The tricuspid regurgitant velocity is 2.78 m/s, and with an assumed right atrial pressure of 8 mmHg, the estimated right ventricular systolic pressure is  38.9 mmHg. Left Atrium: Left atrial size was normal in size. Right Atrium: Right atrial size was normal in size. Pericardium: There is no evidence of pericardial effusion. Mitral Valve: The mitral valve is degenerative in appearance. Trivial mitral valve regurgitation.  No evidence of mitral valve stenosis. Tricuspid Valve: The tricuspid valve is grossly normal. Tricuspid valve regurgitation is not demonstrated. No evidence of tricuspid stenosis. Aortic Valve: The aortic valve is tricuspid. There is moderate calcification of the aortic valve. Aortic valve regurgitation is not visualized. Aortic valve sclerosis is present, with no evidence of aortic valve stenosis. Pulmonic Valve: The pulmonic valve was normal in structure. Pulmonic valve regurgitation is not visualized. No evidence of pulmonic stenosis. Aorta: The aortic root and ascending aorta are structurally normal, with no evidence of dilitation. Venous: The inferior vena cava is dilated in size with greater than 50% respiratory variability, suggesting right atrial pressure of 8 mmHg. IAS/Shunts: No atrial level shunt detected by color flow Doppler.  LEFT VENTRICLE PLAX 2D LVIDd:         5.90 cm      Diastology LVIDs:         5.60 cm      LV e' medial:    3.16 cm/s LV PW:         1.80 cm      LV E/e' medial:  18.7 LV IVS:        1.20 cm      LV e' lateral:   8.11 cm/s LVOT diam:     2.40 cm      LV E/e' lateral: 7.3 LV SV:         43 LV SV Index:   19 LVOT Area:     4.52 cm  LV Volumes (MOD) LV vol d, MOD A2C: 165.0 ml LV vol d, MOD A4C: 207.0 ml LV vol s, MOD A2C: 152.0 ml LV vol s, MOD A4C: 191.0 ml LV SV MOD A2C:     13.0 ml LV SV MOD A4C:     207.0 ml LV SV MOD BP:      15.0 ml RIGHT VENTRICLE            IVC RV S prime:     2.61 cm/s  IVC diam: 3.30 cm TAPSE (M-mode): 0.6 cm LEFT ATRIUM             Index        RIGHT ATRIUM           Index LA diam:        4.90 cm 2.23 cm/m   RA Area:     20.20 cm LA Vol (A2C):   44.7 ml 20.36 ml/m  RA Volume:   64.90 ml   29.56 ml/m LA Vol (A4C):   68.8 ml 31.33 ml/m LA Biplane Vol: 57.7 ml 26.28 ml/m  AORTIC VALVE LVOT Vmax:   70.30 cm/s LVOT Vmean:  44.800 cm/s LVOT VTI:    0.094 m  AORTA Ao Root diam: 3.60 cm Ao Asc diam:  3.80 cm MITRAL VALVE               TRICUSPID VALVE MV Area (PHT): 4.31 cm    TR Peak grad:   30.9 mmHg MV Decel Time: 176 msec    TR Vmax:        278.00 cm/s MV E velocity: 59.20 cm/s MV A velocity: 44.50 cm/s  SHUNTS MV E/A ratio:  1.33        Systemic VTI:  0.09 m                            Systemic Diam: 2.40 cm Riley Lam MD Electronically signed by [place] Dragon  Chandrasekhar MD Signature Date/Time: 08/21/2022/8:54:00 AM    Final    DG CHEST PORT 1 VIEW  Result Date: 08/20/2022 CLINICAL DATA:  Check central line placement EXAM: PORTABLE CHEST 1 VIEW COMPARISON:  Film from earlier in the same day. FINDINGS: Left jugular central line is noted with the catheter tip at the cavoatrial junction. No pneumothorax is noted. Cardiac shadow is stable. Pacing device and postsurgical changes are again seen. The lungs are clear bilaterally. IMPRESSION: No pneumothorax following central line placement. No acute abnormality noted. Electronically Signed   By: Alcide Clever M.D.   On: 08/20/2022 20:16   US RENAL  Result Date: 08/20/2022 CLINICAL DATA:  Acute renal injury EXAM: RENAL / URINARY TRACT ULTRASOUND COMPLETE COMPARISON:  None Available. FINDINGS: Right Kidney: Renal measurements: 11.7 x 4.5 x 4.6 cm. = volume: 125 mL. Echogenicity within normal limits. No mass or hydronephrosis visualized. Left Kidney: Renal measurements: 12.1 x 5.2 x 4.5 cm. = volume: 148 mL. Echogenicity within normal limits. No mass or hydronephrosis visualized. Bladder: Decompressed Other: None. IMPRESSION: Unremarkable kidneys.  No obstructive changes are seen. Electronically Signed   By: Alcide Clever M.D.   On: 08/20/2022 19:59   DG Chest Port 1 View  Result Date: 08/20/2022 CLINICAL DATA:  82 year old male with history of  chest pain. Diarrhea for the past 3 days. EXAM: PORTABLE CHEST 1 VIEW COMPARISON:  Chest x-ray 07/15/2021. FINDINGS: Lung volumes are slightly low. No consolidative airspace disease. No pleural effusions. No pneumothorax. No pulmonary nodule or mass noted. Pulmonary vasculature and the cardiomediastinal silhouette are within normal limits. Atherosclerotic calcifications in the thoracic aorta. Left-sided pacemaker device in place with lead tips projecting over the expected location of the right atrium and right ventricle. Status post median sternotomy for CABG. IMPRESSION: 1. No radiographic evidence of acute cardiopulmonary disease. 2. Aortic atherosclerosis. Electronically Signed   By: Trudie Reed M.D.   On: 08/20/2022 12:22   CUP PACEART REMOTE DEVICE CHECK  Result Date: 08/10/2022 Scheduled remote reviewed. Normal device function.  Next remote 91 days. LA, CVRS   Microbiology: Recent Results (from the past 240 hour(s))  Culture, blood (routine x 2)     Status: None   Collection Time: 08/20/22 12:24 PM   Specimen: Right Antecubital; Blood  Result Value Ref Range Status   Specimen Description   Final    RIGHT ANTECUBITAL BOTTLES DRAWN AEROBIC AND ANAEROBIC   Special Requests Blood Culture adequate volume  Final   Culture   Final    NO GROWTH 5 DAYS Performed at [facility], [address] [place], [place] [address]    Report Status 08/25/2022 FINAL  Final  Culture, blood (routine x 2)     Status: None   Collection Time: 08/20/22 12:24 PM   Specimen: Left Antecubital; Blood  Result Value Ref Range Status   Specimen Description   Final    LEFT ANTECUBITAL BOTTLES DRAWN AEROBIC AND ANAEROBIC   Special Requests Blood Culture adequate volume  Final   Culture   Final    NO GROWTH 5 DAYS Performed at [facility], [address] [place], [place] [address]    Report Status 08/25/2022 FINAL  Final  MRSA Next Gen by PCR, Nasal     Status: None   Collection Time: 08/20/22  4:01 PM    Specimen: Nasal Mucosa; Nasal Swab  Result Value Ref Range Status   MRSA by PCR Next Gen NOT DETECTED NOT DETECTED Final    Comment: (NOTE) The GeneXpert  MRSA Assay (FDA approved for NASAL specimens only), is one component of a comprehensive MRSA colonization surveillance program. It is not intended to diagnose MRSA infection nor to guide or monitor treatment for MRSA infections. Test performance is not FDA approved in patients less than 39 years old. Performed at [facility], [address], [place], [place] [address]   Respiratory (~20 pathogens) panel by PCR     Status: None   Collection Time: 08/22/22  7:38 AM   Specimen: Nasopharyngeal Swab; Respiratory  Result Value Ref Range Status   Adenovirus NOT DETECTED NOT DETECTED Final   Coronavirus 229E NOT DETECTED NOT DETECTED Final    Comment: (NOTE) The Coronavirus on the Respiratory Panel, DOES NOT test for the novel  Coronavirus (2019 nCoV)    Coronavirus HKU1 NOT DETECTED NOT DETECTED Final   Coronavirus NL63 NOT DETECTED NOT DETECTED Final   Coronavirus OC43 NOT DETECTED NOT DETECTED Final   Metapneumovirus NOT DETECTED NOT DETECTED Final   Rhinovirus / Enterovirus NOT DETECTED NOT DETECTED Final   Influenza A NOT DETECTED NOT DETECTED Final   Influenza B NOT DETECTED NOT DETECTED Final   Parainfluenza Virus 1 NOT DETECTED NOT DETECTED Final   Parainfluenza Virus 2 NOT DETECTED NOT DETECTED Final   Parainfluenza Virus 3 NOT DETECTED NOT DETECTED Final   Parainfluenza Virus 4 NOT DETECTED NOT DETECTED Final   Respiratory Syncytial Virus NOT DETECTED NOT DETECTED Final   Bordetella pertussis NOT DETECTED NOT DETECTED Final   Bordetella Parapertussis NOT DETECTED NOT DETECTED Final   Chlamydophila pneumoniae NOT DETECTED NOT DETECTED Final   Mycoplasma pneumoniae NOT DETECTED NOT DETECTED Final    Comment: Performed at [facility], [address], [place], [place] [address]  SARS Coronavirus 2 by RT PCR  (hospital order, performed in [facility] hospital lab) *cepheid single result test* Anterior Nasal Swab     Status: None   Collection Time: 08/22/22  7:38 AM   Specimen: Anterior Nasal Swab  Result Value Ref Range Status   SARS Coronavirus 2 by RT PCR NEGATIVE NEGATIVE Final    Comment: Performed at [facility], [address] [place], [place] [address]     Labs: Basic Metabolic Panel: Recent Labs  Lab 08/22/22 2328 08/23/22 0445 08/24/22 0408 08/24/22 2151 08/25/22 0429 08/26/22 0428  NA 126* 127* 125* 125* 126* 125*  K 4.9 4.3 3.9 4.2 4.2 3.8  CL 87* 88* 81* 79* 80* 81*  CO2 29 25 30 30  32 30  GLUCOSE 101* 100* 95 160* 100* 87  BUN 66* 65* 65* 64* 65* 68*  CREATININE 2.49* 2.37* 2.16* 2.04* 1.92* 1.97*  CALCIUM 8.8* 8.8* 9.0 8.9 9.0 8.6*  MG 2.1 2.1 1.9  --  1.9 1.9  PHOS 4.2  --   --   --   --   --    Liver Function Tests: Recent Labs  Lab 08/20/22 1224 08/24/22 2151  AST 66* 22  ALT 40 38  ALKPHOS 66 83  BILITOT 0.7 0.5  PROT 7.2 6.6  ALBUMIN 4.2 3.6   Recent Labs  Lab 08/20/22 1224  LIPASE 38   No results for input(s): "AMMONIA" in the last 168 hours. CBC: Recent Labs  Lab 08/20/22 1224 08/21/22 0441 08/22/22 2328 08/23/22 0445 08/24/22 1514 08/25/22 0429 08/26/22 0428  WBC 11.8*   < > 12.7* 12.2* 13.7* 10.0 9.0  NEUTROABS 10.4*  --   --   --   --   --   --  HGB 12.3*   < > 11.7* 11.8* 12.8* 12.6* 12.5*  HCT 38.1*   < > 34.6* 34.6* 37.1* 36.2* 36.4*  MCV 96.2   < > 90.1 89.4 88.5 89.6 90.3  PLT 114*   < > 107* 105* 123* 115* 110*   < > = values in this interval not displayed.   Cardiac Enzymes: No results for input(s): "CKTOTAL", "CKMB", "CKMBINDEX", "TROPONINI" in the last 168 hours. BNP: BNP (last 3 results) Recent Labs    08/21/22 0441  BNP 2,520.5*    ProBNP (last 3 results) No results for input(s): "PROBNP" in the last 8760 hours.  CBG: Recent Labs  Lab 08/21/22 0334 08/24/22 1119 08/24/22 1550  GLUCAP 159* 108*  110*       Signed:  Zannie Cove MD.  [facility] 08/26/2022, 11:08 AM

## 2022-08-26 NOTE — Progress Notes (Signed)
Was CTB by RN. Pt back in sustained monomorphic VT. No LOC. Converted w/ amio gtt. D/w EP at bedside. Will proceed w/ LHC.   Cancel d/c.   Robbie Lis, PA-C

## 2022-08-26 NOTE — Plan of Care (Signed)
Patient remains in CVICU at time of writing. Patient is s/p heart cath today. Patient remains on infusion on Amiodarone at time of writing. PIV access only.   Problem: Education: Goal: Knowledge of General Education information will improve Description: Including pain rating scale, medication(s)/side effects and non-pharmacologic comfort measures Outcome: Progressing   Problem: Health Behavior/Discharge Planning: Goal: Ability to manage health-related needs will improve Outcome: Progressing   Problem: Clinical Measurements: Goal: Ability to maintain clinical measurements within normal limits will improve Outcome: Progressing Goal: Will remain free from infection Outcome: Progressing Goal: Diagnostic test results will improve Outcome: Progressing Goal: Respiratory complications will improve Outcome: Progressing Goal: Cardiovascular complication will be avoided Outcome: Progressing   Problem: Activity: Goal: Risk for activity intolerance will decrease Outcome: Progressing   Problem: Coping: Goal: Level of anxiety will decrease Outcome: Progressing   Problem: Elimination: Goal: Will not experience complications related to bowel motility Outcome: Progressing Goal: Will not experience complications related to urinary retention Outcome: Progressing   Problem: Pain Managment: Goal: General experience of comfort will improve Outcome: Progressing   Problem: Safety: Goal: Ability to remain free from injury will improve Outcome: Progressing   Problem: Skin Integrity: Goal: Risk for impaired skin integrity will decrease Outcome: Progressing   Problem: Education: Goal: Understanding of CV disease, CV risk reduction, and recovery process will improve Outcome: Progressing Goal: Individualized Educational Video(s) Outcome: Progressing   Problem: Activity: Goal: Ability to return to baseline activity level will improve Outcome: Progressing   Problem: Cardiovascular: Goal:  Ability to achieve and maintain adequate cardiovascular perfusion will improve Outcome: Progressing Goal: Vascular access site(s) Level 0-1 will be maintained Outcome: Progressing   Problem: Health Behavior/Discharge Planning: Goal: Ability to safely manage health-related needs after discharge will improve Outcome: Progressing

## 2022-08-26 NOTE — Consult Note (Addendum)
ELECTROPHYSIOLOGY CONSULT NOTE    Patient ID: Grant Stevens MRN: [id number], DOB/AGE: 1940/03/11 82 y.o.  Admit date: 08/20/2022 Date of Consult: 08/26/2022  Primary Physician: Carylon Perches, MD Primary Cardiologist: Nona Dell, MD  Electrophysiologist: Dr. Johney Frame -> Dr. Ladona Ridgel   Referring Provider: Dr. Gala Romney   Patient Profile: Grant Stevens is a 82 y.o. male with a history of CAD s/p CABG 2002, HFrED, ICM, and SSS s/p PPM 2022 who is being seen today for the evaluation of VT at the request of Dr. Gala Romney .  HPI:  Grant Stevens is a 82 y.o. male admitted 8/10 with chest pain and hypotension. Had diarrhea preceding.   He went to [facility] and HS trop was >13K. Presentation consistent with cardiogenic shock with hypotension, lactacte of 2.4, Cr 3.8 (Baseline 1.0-1.2). Started on levophed and transferred to [facility].   Central access placed and initial coox 31% on 4 of NE. Repeat 43%. Pt also had MMVT and lido + amio added.  Milrinone added with coox improved to 58%   Diuresed 14 lbs and pressors weaned as tolerated. Coox 59% this am off milrinone. Shock felt to be likely due to OOH infarct. LHC initially scheduled, but given no CP, pts age, and Cr of 1.9 deferred for medical management.   He was planned for discharge today when he was noted to go back into a slow VT in the 110-120 range. EP was asked to come pace out, but pt converted chemically on amio gtt.   Decision made to take pt for LHC as previously discussed.   Pt doing OK currently after converting.  No LOC. Mild chest discomfort during VT.  He has had syncope in the past, but that was prior to his PPM, and non since.   Labs Potassium3.8 (08/16 4098) Magnesium  1.9 (08/16 0428) Creatinine, ser  1.97* (08/16 0428) PLT  110* (08/16 0428) HGB  12.5* (08/16 0428) WBC 9.0 (08/16 0428)  .    Allergies, Medical, Surgical, Social, and Family Histories have been reviewed and are referenced here-in when relevant for medical  decision making.   Physical Exam: Vitals:   08/26/22 1315 08/26/22 1330 08/26/22 1345 08/26/22 1400  BP: 114/69   118/66  Pulse:      Resp: (!) 27 20 18  (!) 29  Temp:      TempSrc:      SpO2:  92% 96%   Weight:      Height:        GEN- NAD, A&O x 3, normal affect HEENT: Normocephalic, atraumatic Lungs- CTAB, Normal effort.  Heart- Regular rate and rhythm, No M/G/R.  GI- Soft, NT, ND.  Extremities- No clubbing, cyanosis, or edema   Radiology/Studies:  Echo 08/21/2022 LVEF <20%, + WMA, grade III DD, severe RV dysfunction, trivial MR,  Echo 02/2022 LVEF 25-30%  EKG: on arrival shows paced rhythm with very wide QRS LBBB pattern.  (personally reviewed)  TELEMETRY: Initially AS-VP with long AV delay in 60s, now AV dual pacing in 90s now after PPM adjusted. Monomorphic VT early (personally reviewed)     DEVICE HISTORY:  St Jude DDD PPM 05/2020 for Symptomatic bradycardia due to heart block Failed attempt at CS lead, with only small, distal, angulated CS branch  Assessment/Plan:  Monomorphic VT New this admission, not previously seen on PPM EF <20% on presentation Likely ischemic. Initiation as above. PPM adjusted LRL to 90 bpm. AV delays have previously been lengthened to try and promote intrinsic conduction; He is  mostly dependent now so can likely be moved to a more physiologic delay prior to dc.  (Currently PAV/SAV 350/325) Continue IV amidoarone Was on bisoprolol prior to arrival. Consider resuming BB as tolerated.  Plan for LHC today.  We discussed the potential need to add an ICD lead to his system pending course.   Acute on chronic CHF Cardiogenic Shock Ischemic cardiomyopathy Possible NSTEMI Initial Coox 30-40s which gradually improved on pressors and diuresis.  Plan for cath today.  Continue to adjust GDMT as tolerated.   Heart block s/p Abbott DDD PPM Normal function last remote.  Did not fully interrogated today.   AKI Cr 1.9 today, baseline ~1.1  For  questions or updates, please contact [facility] Please consult [email or web address] for contact info under Cardiology/STEMI.  Dustin Flock, PA-C  08/26/2022 2:23 PM

## 2022-08-26 NOTE — Progress Notes (Addendum)
Physical Therapy Treatment Patient Details Name: Grant Stevens MRN: [id number] DOB: 05-Aug-1940 Today's Date: 08/26/2022   History of Present Illness 82 y.o. male presents to [facility] hospital on 08/20/2022 as a transfer from [facility] with diarrhea and recent chest pain. Pt found to have AKI and NSTEMI. PMH includes OA, afib, bladder CA, BPH, HFrEF, CABG, depression, HTN, bipolar, HLD.    PT Comments  Pt with slow progress - limited due to soft BP/orthostatic symptoms and fatigued easily.  He did ambulate 10' then 5' with min-mod A and chair follow.  Required seated rest breaks.  Did note that pt fell this early this morning - no injuries noted per documentation.  Pt had some c/o R knee soreness with ambulation ; however, also c/o this prior to fall. Continue plan of care. Patient will benefit from continued inpatient follow up therapy, <3 hours/day    If plan is discharge home, recommend the following: A little help with walking and/or transfers;A little help with bathing/dressing/bathroom;Assistance with cooking/housework;Assist for transportation;Help with stairs or ramp for entrance   Can travel by private vehicle     Yes  Equipment Recommendations  Rolling walker (2 wheels);BSC/3in1    Recommendations for Other Services       Precautions / Restrictions Precautions Precautions: Fall Precaution Comments: A-line Restrictions Weight Bearing Restrictions: No     Mobility  Bed Mobility               General bed mobility comments: in chair and returned to the same    Transfers Overall transfer level: Needs assistance Equipment used: Rolling walker (2 wheels)   Sit to Stand: Min assist           General transfer comment: assist for balance, cues for hand placement; performed x 3    Ambulation/Gait Ambulation/Gait assistance: Mod assist Gait Distance (Feet): 10 Feet (10' then 5')   Gait Pattern/deviations: Step-to pattern, Decreased stride length Gait velocity:  reduced     General Gait Details: Chair follow, seated rest breaks, min-mod A for balance, reports R knee soreness (c/o this prior to fall last night), Only able to tolerate 10' then 5'  with c/o weakness and lightheadedness; did note soft BP and pt pale in standing (see comments)   Stairs             Wheelchair Mobility     Tilt Bed    Modified Rankin (Stroke Patients Only)       Balance Overall balance assessment: Needs assistance Sitting-balance support: Feet supported Sitting balance-Leahy Scale: Good     Standing balance support: Reliant on assistive device for balance, Bilateral upper extremity supported Standing balance-Leahy Scale: Poor Standing balance comment: Requiring RW, swaying back and forth, min-mod A                            Cognition Arousal: Alert Behavior During Therapy: WFL for tasks assessed/performed Overall Cognitive Status: Impaired/Different from baseline Area of Impairment: Safety/judgement                         Safety/Judgement: Decreased awareness of safety, Decreased awareness of deficits     General Comments: Cues for safety, waiting for therapist; was able to voice need for rest breaks        Exercises General Exercises - Lower Extremity Hip Flexion/Marching: AROM, Both, 20 reps, Seated Other Exercises Other Exercises: chest press sitting AROM 10x2  General Comments General comments (skin integrity, edema, etc.):  Pt on RA with O2 sats >94%, HR stable 70's-80's.  BP was 104/64 sitting prior, 88/58 (69) first sitting rest break with pt reported symptoms of lightheadedness/weakness when walking, performed seated exercises, 97/68 (77) after resting and exercises, 102/65 (75) standing, 119/69 (85) sitting feet up.  Notified RN of soft BP and orthostatic symptoms.      Pertinent Vitals/Pain Pain Assessment Pain Assessment: Faces Faces Pain Scale: Hurts a little bit Pain Location: R knee with  ambulation Pain Descriptors / Indicators: Aching Pain Intervention(s): Limited activity within patient's tolerance, Monitored during session    Home Living                          Prior Function            PT Goals (current goals can now be found in the care plan section) Progress towards PT goals: Not progressing toward goals - comment (soft BP/lightheaded)    Frequency    Min 1X/week      PT Plan      Co-evaluation              AM-PAC PT "6 Clicks" Mobility   Outcome Measure  Help needed turning from your back to your side while in a flat bed without using bedrails?: A Little Help needed moving from lying on your back to sitting on the side of a flat bed without using bedrails?: A Little Help needed moving to and from a bed to a chair (including a wheelchair)?: A Little Help needed standing up from a chair using your arms (e.g., wheelchair or bedside chair)?: A Lot Help needed to walk in hospital room?: Total Help needed climbing 3-5 steps with a railing? : Total 6 Click Score: 13    End of Session Equipment Utilized During Treatment: Gait belt Activity Tolerance: Patient limited by fatigue (and soft BP) Patient left: in chair;with call bell/phone within reach;with chair alarm set Nurse Communication: Mobility status PT Visit Diagnosis: Other abnormalities of gait and mobility (R26.89);Muscle weakness (generalized) (M62.81)     Time: 7035-0093 PT Time Calculation (min) (ACUTE ONLY): 23 min  Charges:    $Gait Training: 8-22 mins $Therapeutic Activity: 8-22 mins PT General Charges $$ ACUTE PT VISIT: 1 Visit                     Anise Salvo, PT Acute Rehab Services [facility] [phone]    Rayetta Humphrey 08/26/2022, 11:00 AM

## 2022-08-26 NOTE — Progress Notes (Addendum)
Advanced Heart Failure Rounding Note  PCP-Cardiologist: Nona Dell, MD   Subjective:    CO-OX 59% off milrinone.   Scr, stable past 24 hrs 1.92>>1.97. CVP ~6. SBPs soft, 90s.   Denies CP. No dyspnea. Had fall last night, moving from chair to bed. No LOC. No arrthymic events on tele. Sustained left forearm skin tear, now bandaged. Complains of slight headache. Mental status ok. Mini neuro normal.    Objective:   Weight Range: 91.8 kg Body mass index is 27.45 kg/m.   Vital Signs:   Temp:  [97.8 F (36.6 C)-98.7 F (37.1 C)] 98 F (36.7 C) (08/16 0740) Pulse Rate:  [64-67] 64 (08/16 0550) Resp:  [11-39] 28 (08/16 0645) BP: (83-145)/(56-81) 107/69 (08/16 0600) SpO2:  [81 %-98 %] 95 % (08/16 0645) Weight:  [91.8 kg] 91.8 kg (08/16 0500) Last BM Date : 08/22/22  Weight change: Filed Weights   08/24/22 0500 08/25/22 0500 08/26/22 0500  Weight: 92.3 kg 92.1 kg 91.8 kg    Intake/Output:   Intake/Output Summary (Last 24 hours) at 08/26/2022 0854 Last data filed at 08/26/2022 0400 Gross per 24 hour  Intake 398.26 ml  Output 1900 ml  Net -1501.74 ml      Physical Exam    CVP 6 General:  Well appearing, elderly male, sitting up in chair. No respiratory difficulty HEENT: normal Neck: supple. JVD 6 cm. Carotids 2+ bilat; no bruits. No lymphadenopathy or thyromegaly appreciated. Cor: PMI nondisplaced. Regular rate & rhythm. No rubs, gallops or murmurs. Lungs: clear Abdomen: soft, nontender, nondistended. No hepatosplenomegaly. No bruits or masses. Good bowel sounds. Extremities: no cyanosis, clubbing, rash, edema + TEDs + RUE PICC Neuro: alert & oriented x 3, cranial nerves grossly intact. moves all 4 extremities w/o difficulty. Affect pleasant.  Telemetry   V paced 60s  Labs    CBC Recent Labs    08/25/22 0429 08/26/22 0428  WBC 10.0 9.0  HGB 12.6* 12.5*  HCT 36.2* 36.4*  MCV 89.6 90.3  PLT 115* 110*   Basic Metabolic Panel Recent Labs     08/25/22 0429 08/26/22 0428  NA 126* 125*  K 4.2 3.8  CL 80* 81*  CO2 32 30  GLUCOSE 100* 87  BUN 65* 68*  CREATININE 1.92* 1.97*  CALCIUM 9.0 8.6*  MG 1.9 1.9   Liver Function Tests Recent Labs    08/24/22 2151  AST 22  ALT 38  ALKPHOS 83  BILITOT 0.5  PROT 6.6  ALBUMIN 3.6    No results for input(s): "LIPASE", "AMYLASE" in the last 72 hours.  Cardiac Enzymes No results for input(s): "CKTOTAL", "CKMB", "CKMBINDEX", "TROPONINI" in the last 72 hours.  BNP: BNP (last 3 results) Recent Labs    08/21/22 0441  BNP 2,520.5*    ProBNP (last 3 results) No results for input(s): "PROBNP" in the last 8760 hours.   D-Dimer No results for input(s): "DDIMER" in the last 72 hours. Hemoglobin A1C No results for input(s): "HGBA1C" in the last 72 hours.  Fasting Lipid Panel No results for input(s): "CHOL", "HDL", "LDLCALC", "TRIG", "CHOLHDL", "LDLDIRECT" in the last 72 hours.  Thyroid Function Tests No results for input(s): "TSH", "T4TOTAL", "T3FREE", "THYROIDAB" in the last 72 hours.  Invalid input(s): "FREET3"   Other results:   Imaging    No results found.   Medications:     Scheduled Medications:  amiodarone  200 mg Oral BID   aspirin  81 mg Oral Pre-Cath   aspirin EC  81  mg Oral Daily   atorvastatin  80 mg Oral QHS   carbamazepine  400 mg Oral BID   Chlorhexidine Gluconate Cloth  6 each Topical Daily   clopidogrel  75 mg Oral Daily   diclofenac Sodium  4 g Topical QID   divalproex  250 mg Oral BID   heparin injection (subcutaneous)  5,000 Units Subcutaneous Q8H   melatonin  3 mg Oral QHS   pantoprazole  40 mg Oral QAC breakfast   polyethylene glycol  17 g Oral Daily   senna-docusate  1 tablet Oral BID   temazepam  15 mg Oral QHS   torsemide  20 mg Oral Daily    Infusions:  sodium chloride Stopped (08/20/22 1740)   sodium chloride     sodium chloride 10 mL/hr at 08/26/22 0630    PRN Medications: Place/Maintain arterial line **AND**  sodium chloride, acetaminophen, mouth rinse   Assessment/Plan    1. Acute on chronic systolic HF -> cardiogenic shock - In setting of OOH infarct - Echo 2/24 EF 25-30% RV mildly reduced - Echo 08/21/22: EF < 20% RV severely reduced - Initial co-ox 31%. Started on milrinone and diuresed w/ IV Lasix - Milrinone stopped 8/15  - CO-OX 59% off milrinone  - Diuresed well. Down total of 14 lb. Switch to po Torsemide 20 mg daily. - GDMT limited by soft BP and AKI  - No ? blocker yet w/ marginal output  - Not candidate for advanced therapies with age    2. CAD with possible NSTEMI - h/o CAD s/p CABG  - suspect OOH infarct with loss of SVG - HS troponin > 13K - had heparin x 48 hours - DAPT/statin  - LHC scheduled for today but given absence of CP, age and renal fx, may be best suited for continuation of medical management only. Will d/c MD    3. Ventricular tachycardia - No recurrence  - Off lidocaine - On PO amiodarone - Keep K > 4.0 Mg > 2.0   4. AKI - due to shock/ATN - baseline Scr 1.1 , peaked at 3.8. Now trending down. 1.9 today - Renal has seen   5. SSS - s/p pacer  6. Remote history of atrial fibrillation - Has not been anticoagulated - No recurrence this admission. Would need anticoagulation if recurs.  7. Hyponatremia - restrict FW - Na 125 today. Mentation ok. Monitor    8. Limited DNR - Spoke with him in detail about GOC wishes - Ok with intubation and defibrillation  - Would not want CPR  9. Deconditioning  - will need CIR at d/c    Length of Stay: [id number] Delmer Islam  08/26/2022, 8:54 AM  Advanced Heart Failure Team Pager [phone] (M-F; 7a - 5p)  Please contact [facility] for night-coverage after hours (5p -7a ) and weekends on [email or web address]  Agree with above.   Cath initially planned for this am prior to d/c but SCr stabilized at 1.9-2.0 and patient not having angina so cath cancelled with plans for medical management and d/c to SNF.    Just prior to d/c patient developed slow monomorphic VT. Dropped BP some but was relatively asymptomatic. Converted with IV amio. EP at bedside for possible pace termination (patient with existing Abbott PPM)  Decision made to proceed with cath which showed severe 2-v CAD with patent LIMA and patent SVG-PDA. There was loss of SVG-D1. Hemodynamics were well compensated.   General:  Elderly lying in bed No resp difficulty  HEENT: normal Neck: supple. RA 8-9  Carotids 2+ bilat; no bruits. No lymphadenopathy or thryomegaly appreciated. Cor: PMI nondisplaced. Regular rate & rhythm. No rubs, gallops or murmurs. Lungs: clear Abdomen: soft, nontender, nondistended. No hepatosplenomegaly. No bruits or masses. Good bowel sounds. Extremities: no cyanosis, clubbing, rash, edema Neuro: alert & orientedx3, cranial nerves grossly intact. moves all 4 extremities w/o difficulty. Affect pleasant  Continue IV amio for VT. Case d/w with EP and will plan for ICD on Monday. Watch renal function after cath. GDMT has been limited by CKD and low BP. Titrate as able.   CRITICAL CARE Performed by: Arvilla Meres  Total critical care time: 75 minutes  Critical care time was exclusive of separately billable procedures and treating other patients.  Critical care was necessary to treat or prevent imminent or life-threatening deterioration.  Critical care was time spent personally by me (independent of midlevel providers or residents) on the following activities: development of treatment plan with patient and/or surrogate as well as nursing, discussions with consultants, evaluation of patient's response to treatment, examination of patient, obtaining history from patient or surrogate, ordering and performing treatments and interventions, ordering and review of laboratory studies, ordering and review of radiographic studies, pulse oximetry and re-evaluation of patient's condition.  Arvilla Meres, MD  10:13 PM

## 2022-08-26 NOTE — Progress Notes (Signed)
CARDIAC REHAB PHASE I    Post MI education including restrictions, risk factors, restrictions, MI booklet, medication compliance importance, exercise guidelines, heart healthy diet and CRP2 reviewed. All questions and concerns addressed. Will refer to [facility] for CRP2  if/when medically cleared.  PT/OT assisting with mobility. SNF at discharge.   Woodroe Chen, RN BSN 08/26/2022 12:10 PM

## 2022-08-26 NOTE — Interval H&P Note (Signed)
History and Physical Interval Note:  08/26/2022 3:05 PM  Grant Stevens  has presented today for surgery, with the diagnosis of vt.  The various methods of treatment have been discussed with the patient and family. After consideration of risks, benefits and other options for treatment, the patient has consented to  Procedure(s): RIGHT/LEFT HEART CATH AND CORONARY/GRAFT ANGIOGRAPHY (N/A) as a surgical intervention.  The patient's history has been reviewed, patient examined, no change in status, stable for surgery.  I have reviewed the patient's chart and labs.  Questions were answered to the patient's satisfaction.    Cath Lab Visit (complete for each Cath Lab visit)  Clinical Evaluation Leading to the Procedure:   ACS: Yes.    Non-ACS:    Anginal Classification: CCS III  Anti-ischemic medical therapy: Minimal Therapy (1 class of medications)  Non-Invasive Test Results: No non-invasive testing performed  Prior CABG: Previous CABG       Theron Arista [facility] 08/26/2022 3:06 PM

## 2022-08-26 NOTE — Significant Event (Signed)
08/26/22 0550  What Happened  Was fall witnessed? Yes  Who witnessed fall? Fidela Juneau, RN  Patients activity before fall to/from bed, chair, or stretcher;other (comment) (Sitting on the side of the bed preparing to stand.)  Point of contact other (comment) (chest, abdomen)  Was patient injured? Yes (Skin tears to arm/hand)  Provider Notification  Provider Name/Title Lendell Caprice, MD (cardiology)  Date Provider Notified 08/26/22  Time Provider Notified [phone]  Method of Notification Call  Notification Reason Fall  Date of Provider Response 08/26/22  Time of Provider Response 0556  Follow Up  Family notified Yes - comment  Time family notified 0621  Additional tests No Lendell Caprice, MD (cardiology) reports that no further testing needed at this time.)  Simple treatment Dressing  Progress note created (see row info) Yes  Blank note created Yes  Adult Fall Risk Assessment  Risk Factor Category (scoring not indicated) Fall has occurred during this admission (document High fall risk)  Age 82  Fall History: Fall within 6 months prior to admission 0  Elimination; Bowel and/or Urine Incontinence 0  Elimination; Bowel and/or Urine Urgency/Frequency 2  Medications: includes PCA/Opiates, Anti-convulsants, Anti-hypertensives, Diuretics, Hypnotics, Laxatives, Sedatives, and Psychotropics 5  Patient Care Equipment 2  Mobility-Assistance 2  Mobility-Gait 2  Mobility-Sensory Deficit 0  Altered awareness of immediate physical environment 0  Impulsiveness 2  Lack of understanding of one's physical/cognitive limitations 0  Total Score 18  Patient Fall Risk Level High fall risk  Adult Fall Risk Interventions  Required Bundle Interventions *See Row Information* High fall risk - low, moderate, and high requirements implemented  Additional Interventions [employer] chair sensor (education/return demonstration);Pharmacy review of medications;PT/OT need assessed if change in mobility from baseline;Room  near nurses station;Use of appropriate toileting equipment (bedpan, BSC, etc.)  Fall intervention(s) refused/Patient educated regarding refusal Bed alarm  Screening for Fall Injury Risk (To be completed on HIGH fall risk patients) - Assessing Need for Floor Mats  Risk For Fall Injury- Criteria for Floor Mats Previous fall this admission  Will Implement Floor Mats Yes  Vitals  Temp 98.1 F (36.7 C)  Temp Source Oral  BP 109/70  MAP (mmHg) 83  BP Location Left Arm  BP Method Automatic  Patient Position (if appropriate) Sitting  Pulse Rate 64  Pulse Rate Source Monitor  ECG Heart Rate 62  Cardiac Rhythm Ventricular paced  Resp 20  Oxygen Therapy  SpO2 96 %  O2 Device Room Air  Patient Activity (if Appropriate) In chair  Pulse Oximetry Type Continuous  Pain Assessment  Pain Scale 0-10  Pain Score 0  PCA/Epidural/Spinal Assessment  Respiratory Pattern Regular;Unlabored  Neurological  Neuro (WDL) WDL  Level of Consciousness Alert  Orientation Level Oriented X4  Cognition Appropriate at baseline;Follows commands  Speech Clear  R Pupil Size (mm) 3  R Pupil Shape Round  R Pupil Reaction Brisk  L Pupil Size (mm) 3  L Pupil Shape Round  L Pupil Reaction Brisk  Motor Function/Sensation Assessment Grip;Dorsiflexion;Plantar flexion;Motor response;Motor strength  R Hand Grip Strong  L Hand Grip Strong  R Foot Dorsiflexion Moderate  L Foot Dorsiflexion Moderate  R Foot Plantar Flexion Moderate  L Foot Plantar Flexion Moderate  RUE Motor Response Purposeful movement;Responds to commands  RUE Motor Strength 4  LUE Motor Response Purposeful movement;Responds to commands  LUE Motor Strength 4  RLE Motor Response Purposeful movement;Responds to commands  RLE Motor Strength 4  LLE Motor Response Purposeful movement;Responds to commands  LLE Motor Strength 4  Neuro Symptoms None  Glasgow Coma Scale  Eye Opening 4  Best Verbal Response (NON-intubated) 5  Best Motor Response 6   Glasgow Coma Scale Score 15  Musculoskeletal  Musculoskeletal (WDL) X  Assistive Device Cane;Front wheel walker  Generalized Weakness Yes  Weight Bearing Restrictions No  Integumentary  Integumentary (WDL) X  Skin Color Appropriate for ethnicity  Skin Condition Dry  Skin Integrity Abrasion  Abrasion Location Arm;Hand;Neck  Abrasion Location Orientation Left;Lower;Upper  Abrasion Intervention Cleansed;Foam;Gauze  Skin Turgor Non-tenting  Pain Assessment  Work-Related Injury No   This RN began to prepare pt to stand and transfer from bed to chair. Pt noted to be a high fall risk previously and appropriate interventions implemented. This RN had assisted pt to the side of the bed and remained with the pt while seated.  Floor mats moved from bedside during preparation of pt movement. This RN began to move bedside table from out of the direction of movement and instructed the pt to remain in bed. Pt reported an understanding to remain in the bed. Before this RN could fully move the bedside table, the pt had stood up and began to fall forward. This RN was able to assist the patient's fall to the ground. The pt did not strike his head during the event. He landed with his chest and abdomen on the floor with arms stretched out to the side. This RN called for assistance and other staff came to the bedside. PT noted to remain conscious throughout the event and full assessment completed. Pt did not have pain immediately after the event. Vitals assessed and noted in chart. Pt assisted with turn from prone to supine position with staff assistance. Pt then assisted with sitting on the floor from lying position. Vitals reassessed and pt continues to deny pain. Pt reports slight dizziness that resolves. Pt assisted with standing and placed in chair by staff, chair alarm placed prior to pt sitting in chair. Vitals reassessed and pt assessed again. Pt noted to have several skin tears to left forearm that were then  cleaned and dressed by this RN.  Pt continues to deny pain and no further injury noted. Lendell Caprice, MD called and reports that no further interventions or orders are needed at this time. Pt's significant other notified and she reports understanding of the event and denies any questions or concerns at this time. This RN remains with the pt and continues to assess physically and vitals. Vitals remain within pt's previously noted range. Safety huddle completed with involved staff Arnetha Gula, RN; Lyndal Pulley, RN; Alycia Rossetti, NT and this RN). No additional fall interventions needed at this time. No further injuries noted.

## 2022-08-26 NOTE — H&P (View-Only) (Signed)
Was CTB by RN. Pt back in sustained monomorphic VT. No LOC. Converted w/ amio gtt. D/w EP at bedside. Will proceed w/ LHC.   Cancel d/c.   Robbie Lis, PA-C

## 2022-08-26 NOTE — TOC Progression Note (Addendum)
Transition of Care [facility]) - Progression Note    Patient Details  Name: Grant Stevens MRN: [id number] Date of Birth: July 13, 1940  Transition of Care [facility]) CM/SW Contact  Elliot Cousin, RN Phone Number: [phone] 08/26/2022, 1:42 PM  Clinical Narrative:   DNR on chart, Unit RN to call report to  [place] at [facility] at [phone]. PTAR called.   PTAR cancelled, notified [facility] that dc cancelled.   [facility] SNF does Saturday dc.    Expected Discharge Plan: Skilled Nursing Facility Barriers to Discharge: No Barriers Identified  Expected Discharge Plan and Services In-house Referral: Clinical Social Work Discharge Planning Services: CM Consult Post Acute Care Choice: Skilled Nursing Facility Living arrangements for the past 2 months: Single Family Home Expected Discharge Date: 08/26/22                                     Social Determinants of Health (SDOH) Interventions SDOH Screenings   Food Insecurity: No Food Insecurity (08/24/2022)  Housing: Low Risk  (08/24/2022)  Transportation Needs: No Transportation Needs (08/24/2022)  Utilities: Not At Risk (08/24/2022)  Tobacco Use: Medium Risk (08/20/2022)    Readmission Risk Interventions     No data to display

## 2022-08-27 ENCOUNTER — Other Ambulatory Visit: Payer: Self-pay

## 2022-08-27 ENCOUNTER — Inpatient Hospital Stay (HOSPITAL_COMMUNITY): Payer: Medicare HMO

## 2022-08-27 DIAGNOSIS — R570 Cardiogenic shock: Secondary | ICD-10-CM | POA: Diagnosis not present

## 2022-08-27 DIAGNOSIS — I214 Non-ST elevation (NSTEMI) myocardial infarction: Secondary | ICD-10-CM | POA: Diagnosis not present

## 2022-08-27 DIAGNOSIS — I5023 Acute on chronic systolic (congestive) heart failure: Secondary | ICD-10-CM | POA: Diagnosis not present

## 2022-08-27 DIAGNOSIS — I472 Ventricular tachycardia, unspecified: Secondary | ICD-10-CM | POA: Diagnosis not present

## 2022-08-27 LAB — BASIC METABOLIC PANEL
Anion gap: 14 (ref 5–15)
Anion gap: 15 (ref 5–15)
Anion gap: 15 (ref 5–15)
BUN: 72 mg/dL — ABNORMAL HIGH (ref 8–23)
BUN: 70 mg/dL — ABNORMAL HIGH (ref 8–23)
BUN: 74 mg/dL — ABNORMAL HIGH (ref 8–23)
CO2: 27 mmol/L (ref 22–32)
CO2: 28 mmol/L (ref 22–32)
CO2: 28 mmol/L (ref 22–32)
Calcium: 8.5 mg/dL — ABNORMAL LOW (ref 8.9–10.3)
Calcium: 9 mg/dL (ref 8.9–10.3)
Calcium: 9.1 mg/dL (ref 8.9–10.3)
Chloride: 80 mmol/L — ABNORMAL LOW (ref 98–111)
Chloride: 81 mmol/L — ABNORMAL LOW (ref 98–111)
Chloride: 80 mmol/L — ABNORMAL LOW (ref 98–111)
Creatinine, Ser: 1.82 mg/dL — ABNORMAL HIGH (ref 0.61–1.24)
Creatinine, Ser: 1.92 mg/dL — ABNORMAL HIGH (ref 0.61–1.24)
Creatinine, Ser: 2.19 mg/dL — ABNORMAL HIGH (ref 0.61–1.24)
GFR, Estimated: 37 mL/min — ABNORMAL LOW (ref >60)
GFR, Estimated: 34 mL/min — ABNORMAL LOW (ref >60)
GFR, Estimated: 29 mL/min — ABNORMAL LOW (ref >60)
Glucose, Bld: 86 mg/dL (ref 70–99)
Glucose, Bld: 110 mg/dL — ABNORMAL HIGH (ref 70–99)
Glucose, Bld: 125 mg/dL — ABNORMAL HIGH (ref 70–99)
Potassium: 4.4 mmol/L (ref 3.5–5.1)
Potassium: 4.3 mmol/L (ref 3.5–5.1)
Potassium: 4.5 mmol/L (ref 3.5–5.1)
Sodium: 121 mmol/L — ABNORMAL LOW (ref 135–145)
Sodium: 124 mmol/L — ABNORMAL LOW (ref 135–145)
Sodium: 123 mmol/L — ABNORMAL LOW (ref 135–145)

## 2022-08-27 LAB — HEPATIC FUNCTION PANEL
ALT: 29 U/L (ref 0–44)
AST: 22 U/L (ref 15–41)
Albumin: 3.6 g/dL (ref 3.5–5.0)
Alkaline Phosphatase: 96 U/L (ref 38–126)
Bilirubin, Direct: 0.1 mg/dL (ref 0.0–0.2)
Indirect Bilirubin: 0.6 mg/dL (ref 0.3–0.9)
Total Bilirubin: 0.7 mg/dL (ref 0.3–1.2)
Total Protein: 6.9 g/dL (ref 6.5–8.1)

## 2022-08-27 LAB — CBC
HCT: 36.4 % — ABNORMAL LOW (ref 39.0–52.0)
Hemoglobin: 12.6 g/dL — ABNORMAL LOW (ref 13.0–17.0)
MCH: 31.2 pg (ref 26.0–34.0)
MCHC: 34.6 g/dL (ref 30.0–36.0)
MCV: 90.1 fL (ref 80.0–100.0)
Platelets: 104 10*3/uL — ABNORMAL LOW (ref 150–400)
RBC: 4.04 MIL/uL — ABNORMAL LOW (ref 4.22–5.81)
RDW: 13.2 % (ref 11.5–15.5)
WBC: 11.1 10*3/uL — ABNORMAL HIGH (ref 4.0–10.5)
nRBC: 0 % (ref 0.0–0.2)

## 2022-08-27 LAB — COOXEMETRY PANEL
Carboxyhemoglobin: 1.7 % — ABNORMAL HIGH (ref 0.5–1.5)
Methemoglobin: <0.7 % (ref 0.0–1.5)
O2 Saturation: 67.8 %
Total hemoglobin: 13.2 g/dL (ref 12.0–16.0)

## 2022-08-27 LAB — SODIUM, URINE, RANDOM: Sodium, Ur: 12 mmol/L

## 2022-08-27 LAB — MAGNESIUM: Magnesium: 2.4 mg/dL (ref 1.7–2.4)

## 2022-08-27 LAB — OSMOLALITY, URINE: Osmolality, Ur: 492 mOsm/kg (ref 300–900)

## 2022-08-27 MED ORDER — MIRTAZAPINE 15 MG PO TBDP
15.0000 mg | ORAL_TABLET | Freq: Every day | ORAL | Status: DC
  Administered 2022-08-28 – 2022-09-01 (×5): 15 mg via ORAL
  Filled 2022-08-27 (×8): qty 1

## 2022-08-27 MED ORDER — SODIUM CHLORIDE 0.9% FLUSH
10.0000 mL | Freq: Two times a day (BID) | INTRAVENOUS | Status: DC
  Administered 2022-08-28 – 2022-08-29 (×2): 10 mL

## 2022-08-27 MED ORDER — CARBAMAZEPINE 200 MG PO TABS
200.0000 mg | ORAL_TABLET | Freq: Two times a day (BID) | ORAL | Status: DC
  Administered 2022-08-27 – 2022-08-29 (×5): 200 mg via ORAL
  Filled 2022-08-27 (×6): qty 1

## 2022-08-27 MED ORDER — SODIUM CHLORIDE 0.9% FLUSH: 10.0000 mL | INTRAVENOUS | Status: DC | PRN

## 2022-08-27 MED ORDER — TOLVAPTAN 15 MG PO TABS
15.0000 mg | ORAL_TABLET | Freq: Once | ORAL | Status: AC
  Administered 2022-08-27: 15 mg via ORAL
  Filled 2022-08-27: qty 1

## 2022-08-27 MED ORDER — CARBAMAZEPINE 200 MG PO TABS
200.0000 mg | ORAL_TABLET | Freq: Two times a day (BID) | ORAL | Status: DC
  Filled 2022-08-27: qty 1

## 2022-08-27 NOTE — Progress Notes (Signed)
Patient Profile: Grant Stevens is a 82 y.o. male with a history of CAD s/p CABG 2002, HFrED, ICM  and SSS s/p  CRT-PPM (failed LV lead)  2022 stage 4 kidney disaease a/C who is being seen 8/16 for the evaluation of VT ins etting of STEMI  at the request of Dr. Gala Romney . CAth >> patent LIMA SVG >>RCA patent GDMT  stopped by hypotension  Profound Hyponatremia (worsening)  Echo EF < 20% << 25-30%   QRSd 250 msec  Medical therapy initiated and ICD declined      Patient Name: Grant Stevens      SUBJECTIVE:weak and tired, did not sleep and still with some chest pain   Past Medical History:  Diagnosis Date   Allergic rhinitis    Arthritis    Asthma    Childhood   Atrial fibrillation (HCC)    Remote history - WARCEF   Bladder cancer (HCC)    BPH (benign prostatic hyperplasia)    Cardiomyopathy (HCC)    CHF (congestive heart failure) (HCC)    a. EF 30-35% by echo in 2018 and 10/2018, at 25-30% in 05/2020   Coronary atherosclerosis of native coronary artery    Multivessel s/p CABG in 2002 post-IMI, LVEF 35% up to 50% postoperatively;   Depression    Difficulty sleeping    Erectile dysfunction    Essential hypertension    Frequency of urination    History of bipolar disorder    Hyperlipidemia    IBS (irritable bowel syndrome)    Myocardial infarction (HCC) 2002    Scheduled Meds:  Scheduled Meds:  aspirin EC  81 mg Oral Daily   atorvastatin  80 mg Oral QHS   bisacodyl  10 mg Rectal Once   carbamazepine  200 mg Oral BID   Chlorhexidine Gluconate Cloth  6 each Topical Daily   clopidogrel  75 mg Oral Daily   diclofenac Sodium  4 g Topical QID   divalproex  250 mg Oral BID   heparin  5,000 Units Subcutaneous Q8H   melatonin  3 mg Oral QHS   pantoprazole  40 mg Oral QAC breakfast   polyethylene glycol  17 g Oral BID   senna-docusate  2 tablet Oral BID   sodium chloride flush  3 mL Intravenous Q12H   sorbitol  30 mL Oral Once   temazepam  15 mg Oral QHS    tolvaptan  15 mg Oral Once   Continuous Infusions:  sodium chloride Stopped (08/20/22 1740)   sodium chloride     sodium chloride     amiodarone 30 mg/hr (08/27/22 0800)   Place/Maintain arterial line **AND** sodium chloride, sodium chloride, acetaminophen, mouth rinse, sodium chloride flush    PHYSICAL EXAM Vitals:   08/27/22 0500 08/27/22 0600 08/27/22 0700 08/27/22 0800  BP: 97/74 103/67  98/76  Pulse:      Resp: (!) 25 (!) 21 15 (!) 30  Temp:      TempSrc:      SpO2: 97% 99% 100% 100%  Weight: 91.5 kg     Height:        Well developed and nourished in no acute distress HENT normal Neck supple Clear-lat Regular rate and rhythm, no murmurs or gallops Abd-soft with active BS No Clubbing cyanosis edema Skin-warm and dry A & Oriented  Grossly normal sensory and motor function     TELEMETRY: Reviewed personnally pt in AV pacing with long AV delay:  ECG personally reviewed  Intake/Output Summary (Last 24 hours) at 08/27/2022 0839 Last data filed at 08/27/2022 0800 Gross per 24 hour  Intake 1616.57 ml  Output 772 ml  Net 844.57 ml    LABS: Basic Metabolic Panel: Recent Labs  Lab 08/22/22 2328 08/23/22 0445 08/24/22 0408 08/24/22 2151 08/25/22 0429 08/26/22 0428 08/26/22 1534 08/26/22 1541 08/26/22 1912 08/27/22 0042  NA 126* 127* 125* 125* 126* 125* 124* 121*  122*  --  121*  K 4.9 4.3 3.9 4.2 4.2 3.8 3.9 4.3  4.3  --  4.4  CL 87* 88* 81* 79* 80* 81*  --   --   --  80*  CO2 29 25 30 30  32 30  --   --   --  27  GLUCOSE 101* 100* 95 160* 100* 87  --   --   --  86  BUN 66* 65* 65* 64* 65* 68*  --   --   --  72*  CREATININE 2.49* 2.37* 2.16* 2.04* 1.92* 1.97*  --   --  1.91* 1.82*  CALCIUM 8.8* 8.8* 9.0 8.9 9.0 8.6*  --   --   --  8.5*  MG 2.1 2.1 1.9  --  1.9 1.9  --   --   --  2.4  PHOS 4.2  --   --   --   --   --   --   --   --   --    Cardiac Enzymes: No results for input(s): "CKTOTAL", "CKMB", "CKMBINDEX", "TROPONINI" in the last 72  hours. CBC: Recent Labs  Lab 08/20/22 1224 08/21/22 0441 08/22/22 2328 08/23/22 0445 08/24/22 1514 08/25/22 0429 08/26/22 0428 08/26/22 1534 08/26/22 1541 08/26/22 1912 08/27/22 0042  WBC 11.8*   < > 12.7* 12.2* 13.7* 10.0 9.0  --   --  10.8* 11.1*  NEUTROABS 10.4*  --   --   --   --   --   --   --   --   --   --   HGB 12.3*   < > 11.7* 11.8* 12.8* 12.6* 12.5* 12.9* 13.6  13.6 13.2 12.6*  HCT 38.1*   < > 34.6* 34.6* 37.1* 36.2* 36.4* 38.0* 40.0  40.0 38.7* 36.4*  MCV 96.2   < > 90.1 89.4 88.5 89.6 90.3  --   --  89.0 90.1  PLT 114*   < > 107* 105* 123* 115* 110*  --   --  99* 104*   < > = values in this interval not displayed.   PROTIME: No results for input(s): "LABPROT", "INR" in the last 72 hours. Liver Function Tests: Recent Labs    08/24/22 2151  AST 22  ALT 38  ALKPHOS 83  BILITOT 0.5  PROT 6.6  ALBUMIN 3.6   No results for input(s): "LIPASE", "AMYLASE" in the last 72 hours. BNP: BNP (last 3 results) Recent Labs    08/21/22 0441  BNP 2,520.5*       Device Interrogation: AV reprogrammed 350>>200    ASSESSMENT AND PLAN:  Ventricular tachycardia  NSTemi  CAD.Prior CABG  Pacemaker DOI 2022  Complete heart block   QRSd 250   A/C renal insuffciency Estimated Creatinine Clearance: 34.3 mL/min (A) (by C-G formula based on SCr of 1.82 mg/dL (H)).    Device function normal, but long AV delay, so shortened to improve hemodynamics Will defer to GT but wonder whether there is role for LBBA pacing with QRSd 250 msec Continue amiodarone for VT  Signed, Sherryl Manges MD  08/27/2022

## 2022-08-27 NOTE — Progress Notes (Signed)
Patient ID: Grant Stevens, male   DOB: [birth year], 82 y.o.   MRN: [id number]     Advanced Heart Failure Rounding Note  PCP-Cardiologist: Nona Dell, MD   Subjective:    No central line today so no co-ox or CVP available.    Patient feels worse today, nauseated.  He had an episode of chest pain but describes as more superficial around his PPM.  Na down to 121. Creatinine stable at 1.82.   VT yesterday, started on amiodarone gtt and seen by EP.  Remains on amiodarone gtt, no further VT.   LHC yesterday: Patent LIMA-LAD and SVG-PDA but occluded SVG-D (new, suspect culprit for NSTEMI).  RA mean 7, LVEDP 16, CI 2.37  Objective:   Weight Range: 91.5 kg Body mass index is 27.36 kg/m.   Vital Signs:   Temp:  [97.7 F (36.5 C)-98.6 F (37 C)] 98.6 F (37 C) (08/17 0316) Pulse Rate:  [62-128] 91 (08/17 0400) Resp:  [11-80] 30 (08/17 0800) BP: (82-174)/(58-126) 98/76 (08/17 0800) SpO2:  [89 %-100 %] 100 % (08/17 0800) Weight:  [91.5 kg] 91.5 kg (08/17 0500) Last BM Date : 08/22/22  Weight change: Filed Weights   08/25/22 0500 08/26/22 0500 08/27/22 0500  Weight: 92.1 kg 91.8 kg 91.5 kg    Intake/Output:   Intake/Output Summary (Last 24 hours) at 08/27/2022 0839 Last data filed at 08/27/2022 0800 Gross per 24 hour  Intake 1616.57 ml  Output 772 ml  Net 844.57 ml      Physical Exam    General: NAD, frail Neck: JVP 8-9 cm with HJR, no thyromegaly or thyroid nodule.  Lungs: Clear to auscultation bilaterally with normal respiratory effort. CV: Nondisplaced PMI.  Heart regular S1/S2, no S3/S4, no murmur.  No peripheral edema.   Abdomen: Soft, nontender, no hepatosplenomegaly, no distention.  Skin: Intact without lesions or rashes.  Neurologic: Alert and oriented x 3.  Psych: Normal affect. Extremities: No clubbing or cyanosis.  HEENT: Normal.   Telemetry   A-V sequential pacing  Labs    CBC Recent Labs    08/26/22 1912 08/27/22 0042  WBC 10.8* 11.1*   HGB 13.2 12.6*  HCT 38.7* 36.4*  MCV 89.0 90.1  PLT 99* 104*   Basic Metabolic Panel Recent Labs    86/57/84 0428 08/26/22 1534 08/26/22 1541 08/26/22 1912 08/27/22 0042  NA 125*   < > 121*  122*  --  121*  K 3.8   < > 4.3  4.3  --  4.4  CL 81*  --   --   --  80*  CO2 30  --   --   --  27  GLUCOSE 87  --   --   --  86  BUN 68*  --   --   --  72*  CREATININE 1.97*  --   --  1.91* 1.82*  CALCIUM 8.6*  --   --   --  8.5*  MG 1.9  --   --   --  2.4   < > = values in this interval not displayed.   Liver Function Tests Recent Labs    08/24/22 2151  AST 22  ALT 38  ALKPHOS 83  BILITOT 0.5  PROT 6.6  ALBUMIN 3.6    No results for input(s): "LIPASE", "AMYLASE" in the last 72 hours.  Cardiac Enzymes No results for input(s): "CKTOTAL", "CKMB", "CKMBINDEX", "TROPONINI" in the last 72 hours.  BNP: BNP (last 3 results) Recent Labs  08/21/22 0441  BNP 2,520.5*    ProBNP (last 3 results) No results for input(s): "PROBNP" in the last 8760 hours.   D-Dimer No results for input(s): "DDIMER" in the last 72 hours. Hemoglobin A1C No results for input(s): "HGBA1C" in the last 72 hours.  Fasting Lipid Panel No results for input(s): "CHOL", "HDL", "LDLCALC", "TRIG", "CHOLHDL", "LDLDIRECT" in the last 72 hours.  Thyroid Function Tests No results for input(s): "TSH", "T4TOTAL", "T3FREE", "THYROIDAB" in the last 72 hours.  Invalid input(s): "FREET3"   Other results:   Imaging    [place] EKG SITE RITE  Result Date: 08/27/2022 If Site Rite image not attached, placement could not be confirmed due to current cardiac rhythm.  CARDIAC CATHETERIZATION  Result Date: 08/26/2022   Ost LAD to Prox LAD lesion is 100% stenosed.   Prox LAD to Mid LAD lesion is 90% stenosed.   Ost RCA to Dist RCA lesion is 100% stenosed.   Origin to Prox Graft lesion is 100% stenosed.   LIMA graft was visualized by angiography and is large.   SVG graft was visualized by angiography.   SVG graft  was visualized by angiography and is normal in caliber.   The graft exhibits no disease.   The graft exhibits no disease.   LV end diastolic pressure is normal. Severe 2 vessel occlusive CAD. The left main and LCx are widely patent. Patent LIMA to the LAD. This does supply some flow into the first diagonal Occluded SVG to the diagonal Patent SVG to the right PDA Normal LV filling pressures. PCWP 12/14 mean 11 mm Hg. LVEDP 16 mm Hg Normal right  heart pressures. PAP 28/13 mean 21 mm Hg Cardiac output 5.08 L/min, index 2.37 Plan: medical management.     Medications:     Scheduled Medications:  aspirin EC  81 mg Oral Daily   atorvastatin  80 mg Oral QHS   bisacodyl  10 mg Rectal Once   carbamazepine  200 mg Oral BID   Chlorhexidine Gluconate Cloth  6 each Topical Daily   clopidogrel  75 mg Oral Daily   diclofenac Sodium  4 g Topical QID   divalproex  250 mg Oral BID   heparin  5,000 Units Subcutaneous Q8H   melatonin  3 mg Oral QHS   pantoprazole  40 mg Oral QAC breakfast   polyethylene glycol  17 g Oral BID   senna-docusate  2 tablet Oral BID   sodium chloride flush  3 mL Intravenous Q12H   sorbitol  30 mL Oral Once   temazepam  15 mg Oral QHS   tolvaptan  15 mg Oral Once    Infusions:  sodium chloride Stopped (08/20/22 1740)   sodium chloride     sodium chloride     amiodarone 30 mg/hr (08/27/22 0800)    PRN Medications: Place/Maintain arterial line **AND** sodium chloride, sodium chloride, acetaminophen, mouth rinse, sodium chloride flush   Assessment/Plan    1. Acute on chronic systolic HF -> cardiogenic shock - In setting of OOH infarct (loss of SVG-D) - Echo 2/24 EF 25-30% RV mildly reduced - Echo 08/21/22: EF < 20% RV severely reduced - Initial co-ox 31%. Started on milrinone and diuresed w/ IV Lasix - Milrinone stopped 8/15, CO-OX 59% off milrinone on 8/16 but has no central line now.  - Diuresed well. Down total of 14 lb. Switch to po Torsemide 20 mg daily then  stopped 8/16. - GDMT limited by soft BP and AKI  - No ?  blocker yet w/ marginal output  - Not candidate for advanced therapies with age - Feels worse this morning, nauseated and Na low.  By exam, mild JVD and HJR so will give tolvaptan (see below).  Will also place PICC and monitor CVP and co-ox, may need to restart milrinone (symptoms concerning for low output).  - Seen by Dr. Graciela Husbands today, adjusted AV delay.  Has 250 msec QRS, favor left bundle lead placement (unable to place CS lead in past), will aim for Monday if stable.    2. CAD with possible NSTEMI - h/o CAD s/p CABG  - suspect OOH infarct with loss of SVG-D - HS troponin > 13K - had heparin x 48 hours, now [facility] heparin.  - DAPT/statin  - LHC showed patent LIMA-LAD and SVG-RCA, new occlusion SVG-D.    3. Ventricular tachycardia - Episode again on 8/16, amiodarone gtt restarted.   - Suspect scar-mediated VT, seen by EP.  - Patient clear that he does not want an ICD.  See above for plan for consideration of left bundle lead, discussed with Dr. Graciela Husbands.  - Continue amiodarone gtt x 48 hrs then to po.    4. AKI - due to shock/ATN - baseline Scr 1.1 , peaked at 3.8. Now trending down. 1.82 today - Watch for rebound up with contrast yesterday.    5. SSS/CHB - s/p PPM, unable to place CS lead in past.  Has wide QRS 250 msec.   6. Remote history of atrial fibrillation - Has not been anticoagulated - No recurrence this admission. Would need anticoagulation if recurs.  7. Hyponatremia - Lower today at 121, likely contributing to nausea.  - Fluid restrict 1200 cc for now.  - Has JVD on exam (no central access), will give tolvaptan 15 mg x 1 and repeat BMET in pm.    8. Limited DNR - Ok with intubation and defibrillation  - Would not want CPR  9. Deconditioning  - will need CIR at d/c  - Try to mobilize today.   CRITICAL CARE Performed by: Marca Ancona  Total critical care time: 45 minutes  Critical care time was exclusive  of separately billable procedures and treating other patients.  Critical care was necessary to treat or prevent imminent or life-threatening deterioration.  Critical care was time spent personally by me (independent of midlevel providers or residents) on the following activities: development of treatment plan with patient and/or surrogate as well as nursing, discussions with consultants, evaluation of patient's response to treatment, examination of patient, obtaining history from patient or surrogate, ordering and performing treatments and interventions, ordering and review of laboratory studies, ordering and review of radiographic studies, pulse oximetry and re-evaluation of patient's condition.  Marca Ancona, MD  8:39 AM

## 2022-08-27 NOTE — TOC Progression Note (Signed)
Transition of Care [facility]) - Progression Note    Patient Details  Name: Grant Stevens MRN: [id number] Date of Birth: [birth year]  Transition of Care [facility]) CM/SW Contact  Elliot Cousin, RN Phone Number: [phone]  08/27/2022, 11:37 AM  Clinical Narrative:   DC cancelled on 8/16 due to Vtach. Patient was scheduled dc to SNF [facility], patient's auth # [id number], 08/26/2022-08/30/2022, will redo auth if patient not ready for dc when auth expires on 8/20.    Expected Discharge Plan: Skilled Nursing Facility Barriers to Discharge: No Barriers Identified  Expected Discharge Plan and Services In-house Referral: Clinical Social Work Discharge Planning Services: CM Consult Post Acute Care Choice: Skilled Nursing Facility Living arrangements for the past 2 months: Single Family Home Expected Discharge Date: 08/26/22                                     Social Determinants of Health (SDOH) Interventions SDOH Screenings   Food Insecurity: No Food Insecurity (08/24/2022)  Housing: Low Risk  (08/24/2022)  Transportation Needs: No Transportation Needs (08/24/2022)  Utilities: Not At Risk (08/24/2022)  Tobacco Use: Medium Risk (08/20/2022)    Readmission Risk Interventions     No data to display

## 2022-08-27 NOTE — Progress Notes (Signed)
Patient Profile: Grant Stevens is a 82 y.o. male with a history of CAD s/p CABG 2002, HFrED, ICM  and SSS s/p  CRT-PPM (failed LV lead)  2022 stage 4 kidney disaease a/C who is being seen 8/16 for the evaluation of VT ins etting of STEMI  at the request of Dr. Gala Romney . CAth >> patent LIMA SVG >>RCA patent GDMT  stopped by hypotension  Profound Hyponatremia (worsening)  Echo EF < 20% << 25-30%   QRSd 250 msec  Medical therapy initiated and ICD declined      Patient Name: Grant Stevens      SUBJECTIVE:weak and tired, did not sleep and still with some chest pain   Past Medical History:  Diagnosis Date   Allergic rhinitis    Arthritis    Asthma    Childhood   Atrial fibrillation (HCC)    Remote history - WARCEF   Bladder cancer (HCC)    BPH (benign prostatic hyperplasia)    Cardiomyopathy (HCC)    CHF (congestive heart failure) (HCC)    a. EF 30-35% by echo in 2018 and 10/2018, at 25-30% in 05/2020   Coronary atherosclerosis of native coronary artery    Multivessel s/p CABG in 2002 post-IMI, LVEF 35% up to 50% postoperatively;   Depression    Difficulty sleeping    Erectile dysfunction    Essential hypertension    Frequency of urination    History of bipolar disorder    Hyperlipidemia    IBS (irritable bowel syndrome)    Myocardial infarction (HCC) 2002    Scheduled Meds:  Scheduled Meds:  aspirin EC  81 mg Oral Daily   atorvastatin  80 mg Oral QHS   bisacodyl  10 mg Rectal Once   carbamazepine  200 mg Oral BID   Chlorhexidine Gluconate Cloth  6 each Topical Daily   clopidogrel  75 mg Oral Daily   diclofenac Sodium  4 g Topical QID   divalproex  250 mg Oral BID   heparin  5,000 Units Subcutaneous Q8H   melatonin  3 mg Oral QHS   pantoprazole  40 mg Oral QAC breakfast   polyethylene glycol  17 g Oral BID   senna-docusate  2 tablet Oral BID   sodium chloride flush  3 mL Intravenous Q12H   sorbitol  30 mL Oral Once   temazepam  15 mg Oral QHS    Continuous Infusions:  sodium chloride Stopped (08/20/22 1740)   sodium chloride     sodium chloride     amiodarone 30 mg/hr (08/27/22 0600)   Place/Maintain arterial line **AND** sodium chloride, sodium chloride, acetaminophen, mouth rinse, sodium chloride flush    PHYSICAL EXAM Vitals:   08/27/22 0316 08/27/22 0400 08/27/22 0500 08/27/22 0600  BP: 95/85 108/71 97/74 103/67  Pulse:  91    Resp: (!) 24 (!) 24 (!) 25 (!) 21  Temp: 98.6 F (37 C)     TempSrc: Oral     SpO2: 100% 100% 97% 99%  Weight:   91.5 kg   Height:        Well developed and nourished in no acute distress HENT normal Neck supple Clear-lat Regular rate and rhythm, no murmurs or gallops Abd-soft with active BS No Clubbing cyanosis edema Skin-warm and dry A & Oriented  Grossly normal sensory and motor function     TELEMETRY: Reviewed personnally pt in AV pacing with long AV delay:  ECG personally reviewed    Intake/Output  Summary (Last 24 hours) at 08/27/2022 0755 Last data filed at 08/27/2022 0600 Gross per 24 hour  Intake 1598.47 ml  Output 697 ml  Net 901.47 ml    LABS: Basic Metabolic Panel: Recent Labs  Lab 08/22/22 2328 08/23/22 0445 08/24/22 0408 08/24/22 2151 08/25/22 0429 08/26/22 0428 08/26/22 1534 08/26/22 1541 08/26/22 1912 08/27/22 0042  NA 126* 127* 125* 125* 126* 125* 124* 121*  122*  --  121*  K 4.9 4.3 3.9 4.2 4.2 3.8 3.9 4.3  4.3  --  4.4  CL 87* 88* 81* 79* 80* 81*  --   --   --  80*  CO2 29 25 30 30  32 30  --   --   --  27  GLUCOSE 101* 100* 95 160* 100* 87  --   --   --  86  BUN 66* 65* 65* 64* 65* 68*  --   --   --  72*  CREATININE 2.49* 2.37* 2.16* 2.04* 1.92* 1.97*  --   --  1.91* 1.82*  CALCIUM 8.8* 8.8* 9.0 8.9 9.0 8.6*  --   --   --  8.5*  MG 2.1 2.1 1.9  --  1.9 1.9  --   --   --  2.4  PHOS 4.2  --   --   --   --   --   --   --   --   --    Cardiac Enzymes: No results for input(s): "CKTOTAL", "CKMB", "CKMBINDEX", "TROPONINI" in the last 72  hours. CBC: Recent Labs  Lab 08/20/22 1224 08/21/22 0441 08/22/22 2328 08/23/22 0445 08/24/22 1514 08/25/22 0429 08/26/22 0428 08/26/22 1534 08/26/22 1541 08/26/22 1912 08/27/22 0042  WBC 11.8*   < > 12.7* 12.2* 13.7* 10.0 9.0  --   --  10.8* 11.1*  NEUTROABS 10.4*  --   --   --   --   --   --   --   --   --   --   HGB 12.3*   < > 11.7* 11.8* 12.8* 12.6* 12.5* 12.9* 13.6  13.6 13.2 12.6*  HCT 38.1*   < > 34.6* 34.6* 37.1* 36.2* 36.4* 38.0* 40.0  40.0 38.7* 36.4*  MCV 96.2   < > 90.1 89.4 88.5 89.6 90.3  --   --  89.0 90.1  PLT 114*   < > 107* 105* 123* 115* 110*  --   --  99* 104*   < > = values in this interval not displayed.   PROTIME: No results for input(s): "LABPROT", "INR" in the last 72 hours. Liver Function Tests: Recent Labs    08/24/22 2151  AST 22  ALT 38  ALKPHOS 83  BILITOT 0.5  PROT 6.6  ALBUMIN 3.6   No results for input(s): "LIPASE", "AMYLASE" in the last 72 hours. BNP: BNP (last 3 results) Recent Labs    08/21/22 0441  BNP 2,520.5*       Device Interrogation: AV reprogrammed 350>>200    ASSESSMENT AND PLAN:  Ventricular tachycardia  NSTemi  CAD.Prior CABG  Pacemaker DOI 2022  Complete heart block   QRSd 250   A/C renal insuffciency Estimated Creatinine Clearance: 34.3 mL/min (A) (by C-G formula based on SCr of 1.82 mg/dL (H)).    Device function normal, but long AV delay, so shortened to improve hemodynamics Will defer to GT but wonder whether there is role for LBBA pacing with QRSd 250 msec Continue amiodarone for VT  Signed, Sherryl Manges MD  08/27/2022

## 2022-08-27 NOTE — Progress Notes (Addendum)
PROGRESS NOTE   Grant Stevens  EXB:[id number]    DOB: 1940-08-14    DOA: 08/20/2022  PCP: Carylon Perches, MD   Brief Narrative:  82 year old male with PMH of CAD, s/p CABG, chronic systolic CHF, s/p PPM, presented to ED on 08/20/2022 with complaints of diarrhea for 24 hours.  Also had intermittent chest pain with associated dyspnea and diaphoresis.  Hypotensive in the ED to 87/75 and hypoxic with oxygen saturation of 82%.  Admitted for acute on chronic systolic heart failure with cardiogenic shock, CAD with possible NSTEMI complicated by acute kidney injury.  Cardiology and nephrology consulting.  Treated with  IV milrinone drip and IV Lasix. -Volume status improved, milrinone discontinued 8/15 -Discharge planned 8/16 -Subsequently had prolonged monomorphic V. tach, discharge canceled, left heart cath noted occluded SVG to diagonal, medical management recommended, started on Amio gtt., EP consulted  Subjective:  Feels weak this morning, poor appetite, nauseated  Assessment & Plan:   Acute on chronic systolic CHF with cardiogenic shock Advanced heart failure team following -TTE 8/11: LVEF <20% with severely reduced RV -Diuresed with IV Lasix and milrinone, now off milrinone -10 L since admission and weight down by about 15 pounds. -Off GDMT due to shock and acute kidney injury.  - Not a candidate for advanced therapies per cardiology. -Initially plans to defer cath, subsequently had sustained V. tach, LHC yesterday noted patent LIMA to LAD, SVG to PDA but occluded SVG to diagonal, normal filling pressures, medical management recommended  CAD with possible NSTEMI History of CAD s/p CABG Completed heparin x 48 hours. Continue DAPT with aspirin and Plavix. LHC showed patent LIMA-LAD and SVG-RCA, occluded SVG-diagonal.,  Medical management recommended  Ventricular tachycardia -Recurrent episode 8/16, 10 minutes of sustained V. Tach -Amio gtt. Restarted -Plan to continue amiodarone for 48  hours and then changed to p.o., electrolytes are stable  Acute kidney injury due to cardiogenic shock and ATN Baseline serum creatinine 1.1.  Creatinine peaked to 3.8, trending down, now 1.8  Hyperkalemia: Secondary to AKI.  Resolved.  Chronic right knee pain: Suspect osteoarthritis.  No history of trauma or gout. As needed Tylenol and topical Voltaren gel.  Hyponatremia Multifactorial due to decompensated CHF, diuretics, AKI etc. -Down to 121 today, plan for tolvaptan -Clinically appears euvolemic,, check urine sodium and osmolarity, did not receive diuretics yesterday, decrease Tegretol dose which also can lower sodium and check a.m. cortisol -Labs this afternoon  Sick sinus syndrome S/p pacemaker with paced rhythm on telemetry at bedside  Remote history of A-fib Not on anticoagulation.  Per cardiology if recurs, would need anticoagulation.  GERD: PPI.  Anemia and thrombocytopenia: Stable.  Insomnia: -Remeron  Nausea and vomiting: Unclear etiology.   -Improved with supportive care, monitor, add laxatives he has been constipated for 5 days   Body mass index is 27.36 kg/m.   DVT prophylaxis: Hep SQ   Code Status: DNR: Limited code okay with intubation and defibrillation, does not want CPR Family Communication: None at bedside Disposition: SNF next week depending on clinical course     Consultants:   Cardiology/advanced heart failure cardiologist Nephrology  Procedures:     Antimicrobials:     Objective:   Vitals:   08/27/22 0800 08/27/22 0830 08/27/22 0900 08/27/22 0930  BP: 98/76 116/76 119/69 113/84  Pulse:      Resp: (!) 30 18 18  (!) 21  Temp: 97.6 F (36.4 C)     TempSrc: Axillary     SpO2: 100% 100% 100% 100%  Weight:      Height:        Gen: Weak chronically ill-appearing awake, Alert, Oriented X 3,  HEENT: no JVD Lungs: Good air movement bilaterally, CTAB CVS: S1S2/RRR Abd: soft, Non tender, non distended, BS present Extremities: No  edema Skin: no new rashes on exposed skin     Data Reviewed:   I have personally reviewed following labs and imaging studies   CBC: Recent Labs  Lab 08/20/22 1224 08/21/22 0441 08/26/22 0428 08/26/22 1534 08/26/22 1541 08/26/22 1912 08/27/22 0042  WBC 11.8*   < > 9.0  --   --  10.8* 11.1*  NEUTROABS 10.4*  --   --   --   --   --   --   HGB 12.3*   < > 12.5*   < > 13.6  13.6 13.2 12.6*  HCT 38.1*   < > 36.4*   < > 40.0  40.0 38.7* 36.4*  MCV 96.2   < > 90.3  --   --  89.0 90.1  PLT 114*   < > 110*  --   --  99* 104*   < > = values in this interval not displayed.    Basic Metabolic Panel: Recent Labs  Lab 08/22/22 2328 08/23/22 0445 08/24/22 0408 08/24/22 2151 08/25/22 0429 08/26/22 0428 08/26/22 1534 08/26/22 1541 08/26/22 1912 08/27/22 0042  NA 126* 127* 125* 125* 126* 125* 124* 121*  122*  --  121*  K 4.9 4.3 3.9 4.2 4.2 3.8 3.9 4.3  4.3  --  4.4  CL 87* 88* 81* 79* 80* 81*  --   --   --  80*  CO2 29 25 30 30  32 30  --   --   --  27  GLUCOSE 101* 100* 95 160* 100* 87  --   --   --  86  BUN 66* 65* 65* 64* 65* 68*  --   --   --  72*  CREATININE 2.49* 2.37* 2.16* 2.04* 1.92* 1.97*  --   --  1.91* 1.82*  CALCIUM 8.8* 8.8* 9.0 8.9 9.0 8.6*  --   --   --  8.5*  MG 2.1 2.1 1.9  --  1.9 1.9  --   --   --  2.4  PHOS 4.2  --   --   --   --   --   --   --   --   --     Liver Function Tests: Recent Labs  Lab 08/20/22 1224 08/24/22 2151  AST 66* 22  ALT 40 38  ALKPHOS 66 83  BILITOT 0.7 0.5  PROT 7.2 6.6  ALBUMIN 4.2 3.6    CBG: Recent Labs  Lab 08/21/22 0334 08/24/22 1119 08/24/22 1550  GLUCAP 159* 108* 110*    Microbiology Studies:   Recent Results (from the past 240 hour(s))  Culture, blood (routine x 2)     Status: None   Collection Time: 08/20/22 12:24 PM   Specimen: Right Antecubital; Blood  Result Value Ref Range Status   Specimen Description   Final    RIGHT ANTECUBITAL BOTTLES DRAWN AEROBIC AND ANAEROBIC   Special Requests Blood  Culture adequate volume  Final   Culture   Final    NO GROWTH 5 DAYS Performed at [facility], [address], [place], [place] [address]    Report Status 08/25/2022 FINAL  Final  Culture, blood (routine x 2)     Status: None  Collection Time: 08/20/22 12:24 PM   Specimen: Left Antecubital; Blood  Result Value Ref Range Status   Specimen Description   Final    LEFT ANTECUBITAL BOTTLES DRAWN AEROBIC AND ANAEROBIC   Special Requests Blood Culture adequate volume  Final   Culture   Final    NO GROWTH 5 DAYS Performed at [facility], [address] [place], [place] [address]    Report Status 08/25/2022 FINAL  Final  MRSA Next Gen by PCR, Nasal     Status: None   Collection Time: 08/20/22  4:01 PM   Specimen: Nasal Mucosa; Nasal Swab  Result Value Ref Range Status   MRSA by PCR Next Gen NOT DETECTED NOT DETECTED Final    Comment: (NOTE) The GeneXpert MRSA Assay (FDA approved for NASAL specimens only), is one component of a comprehensive MRSA colonization surveillance program. It is not intended to diagnose MRSA infection nor to guide or monitor treatment for MRSA infections. Test performance is not FDA approved in patients less than 58 years old. Performed at [facility], [address], [place], [place] [address]   Respiratory (~20 pathogens) panel by PCR     Status: None   Collection Time: 08/22/22  7:38 AM   Specimen: Nasopharyngeal Swab; Respiratory  Result Value Ref Range Status   Adenovirus NOT DETECTED NOT DETECTED Final   Coronavirus 229E NOT DETECTED NOT DETECTED Final    Comment: (NOTE) The Coronavirus on the Respiratory Panel, DOES NOT test for the novel  Coronavirus (2019 nCoV)    Coronavirus HKU1 NOT DETECTED NOT DETECTED Final   Coronavirus NL63 NOT DETECTED NOT DETECTED Final   Coronavirus OC43 NOT DETECTED NOT DETECTED Final   Metapneumovirus NOT DETECTED NOT DETECTED Final   Rhinovirus / Enterovirus NOT DETECTED NOT DETECTED Final   Influenza A NOT  DETECTED NOT DETECTED Final   Influenza B NOT DETECTED NOT DETECTED Final   Parainfluenza Virus 1 NOT DETECTED NOT DETECTED Final   Parainfluenza Virus 2 NOT DETECTED NOT DETECTED Final   Parainfluenza Virus 3 NOT DETECTED NOT DETECTED Final   Parainfluenza Virus 4 NOT DETECTED NOT DETECTED Final   Respiratory Syncytial Virus NOT DETECTED NOT DETECTED Final   Bordetella pertussis NOT DETECTED NOT DETECTED Final   Bordetella Parapertussis NOT DETECTED NOT DETECTED Final   Chlamydophila pneumoniae NOT DETECTED NOT DETECTED Final   Mycoplasma pneumoniae NOT DETECTED NOT DETECTED Final    Comment: Performed at [facility], [address] [place], [place] [address]  SARS Coronavirus 2 by RT PCR (hospital order, performed in [facility] hospital lab) *cepheid single result test* Anterior Nasal Swab     Status: None   Collection Time: 08/22/22  7:38 AM   Specimen: Anterior Nasal Swab  Result Value Ref Range Status   SARS Coronavirus 2 by RT PCR NEGATIVE NEGATIVE Final    Comment: Performed at [facility], [address], [place], [place] [address]    Radiology Studies:  [place] EKG SITE RITE  Result Date: 08/27/2022 If Site Rite image not attached, placement could not be confirmed due to current cardiac rhythm.  CARDIAC CATHETERIZATION  Result Date: 08/26/2022   Ost LAD to Prox LAD lesion is 100% stenosed.   Prox LAD to Mid LAD lesion is 90% stenosed.   Ost RCA to Dist RCA lesion is 100% stenosed.   Origin to Prox Graft lesion is 100% stenosed.   LIMA graft was visualized by angiography and is large.  SVG graft was visualized by angiography.   SVG graft was visualized by angiography and is normal in caliber.   The graft exhibits no disease.   The graft exhibits no disease.   LV end diastolic pressure is normal. Severe 2 vessel occlusive CAD. The left main and LCx are widely patent. Patent LIMA to the LAD. This does supply some flow into the first diagonal Occluded SVG to the diagonal  Patent SVG to the right PDA Normal LV filling pressures. PCWP 12/14 mean 11 mm Hg. LVEDP 16 mm Hg Normal right  heart pressures. PAP 28/13 mean 21 mm Hg Cardiac output 5.08 L/min, index 2.37 Plan: medical management.    Scheduled Meds:    aspirin EC  81 mg Oral Daily   atorvastatin  80 mg Oral QHS   bisacodyl  10 mg Rectal Once   carbamazepine  200 mg Oral BID   Chlorhexidine Gluconate Cloth  6 each Topical Daily   clopidogrel  75 mg Oral Daily   divalproex  250 mg Oral BID   heparin  5,000 Units Subcutaneous Q8H   melatonin  3 mg Oral QHS   pantoprazole  40 mg Oral QAC breakfast   polyethylene glycol  17 g Oral BID   senna-docusate  2 tablet Oral BID   sodium chloride flush  3 mL Intravenous Q12H   sorbitol  30 mL Oral Once   temazepam  15 mg Oral QHS    Continuous Infusions:    sodium chloride Stopped (08/20/22 1740)   sodium chloride     sodium chloride     amiodarone 30 mg/hr (08/27/22 1005)     LOS: 7 days     Zannie Cove, MD,   [facility]  08/27/2022, 10:21 AM

## 2022-08-27 NOTE — Plan of Care (Signed)
  Problem: Education: Goal: Knowledge of General Education information will improve Description: Including pain rating scale, medication(s)/side effects and non-pharmacologic comfort measures Outcome: Progressing   Problem: Health Behavior/Discharge Planning: Goal: Ability to manage health-related needs will improve Outcome: Progressing   Problem: Clinical Measurements: Goal: Ability to maintain clinical measurements within normal limits will improve Outcome: Progressing Goal: Diagnostic test results will improve Outcome: Progressing   Problem: Activity: Goal: Risk for activity intolerance will decrease Outcome: Progressing   Problem: Coping: Goal: Level of anxiety will decrease Outcome: Progressing   Problem: Education: Goal: Understanding of CV disease, CV risk reduction, and recovery process will improve Outcome: Progressing   Problem: Cardiovascular: Goal: Vascular access site(s) Level 0-1 will be maintained Outcome: Progressing

## 2022-08-27 NOTE — Progress Notes (Signed)
Peripherally Inserted Central Catheter Placement  The IV Nurse has discussed with the patient and/or persons authorized to consent for the patient, the purpose of this procedure and the potential benefits and risks involved with this procedure.  The benefits include less needle sticks, lab draws from the catheter, and the patient may be discharged home with the catheter. Risks include, but not limited to, infection, bleeding, blood clot (thrombus formation), and puncture of an artery; nerve damage and irregular heartbeat and possibility to perform a PICC exchange if needed/ordered by physician.  Alternatives to this procedure were also discussed.  Bard Power PICC patient education guide, fact sheet on infection prevention and patient information card has been provided to patient /or left at bedside.    PICC Placement Documentation  PICC Double Lumen 08/27/22 Right Brachial 40 cm 0 cm (Active)  Indication for Insertion or Continuance of Line Vasoactive infusions 08/27/22 1341  Exposed Catheter (cm) 0 cm 08/27/22 1341  Site Assessment Clean, Dry, Intact 08/27/22 1341  Lumen #1 Status Saline locked;Flushed;Blood return noted 08/27/22 1341  Lumen #2 Status Saline locked;Flushed;Blood return noted 08/27/22 1341  Dressing Type Transparent;Securing device 08/27/22 1341  Dressing Status Antimicrobial disc in place;Clean, Dry, Intact 08/27/22 1341  Line Care Zeroed and calibrated 08/27/22 1341  Line Adjustment (NICU/IV Team Only) No 08/27/22 1341  Dressing Intervention New dressing 08/27/22 1341  Dressing Change Due 09/03/22 08/27/22 1341       Burnard Bunting Chenice 08/27/2022, 1:43 PM

## 2022-08-27 NOTE — Progress Notes (Signed)
Bedside nurse called regarding changing the appropriate CODE STATUS order on the chart. -Per patient:  Limited code okay with intubation and defibrillation, does not want CPR.   -Patient also reporting for insomnia.  Melatonin is not helpful ordered Remeron 15 mg at bedtime   Tereasa Coop, MD [facility] 08/27/2022, 10:57 PM

## 2022-08-28 DIAGNOSIS — I214 Non-ST elevation (NSTEMI) myocardial infarction: Secondary | ICD-10-CM | POA: Diagnosis not present

## 2022-08-28 DIAGNOSIS — I472 Ventricular tachycardia, unspecified: Secondary | ICD-10-CM | POA: Diagnosis not present

## 2022-08-28 DIAGNOSIS — R570 Cardiogenic shock: Secondary | ICD-10-CM | POA: Diagnosis not present

## 2022-08-28 DIAGNOSIS — I5023 Acute on chronic systolic (congestive) heart failure: Secondary | ICD-10-CM | POA: Diagnosis not present

## 2022-08-28 DIAGNOSIS — Z8679 Personal history of other diseases of the circulatory system: Secondary | ICD-10-CM | POA: Diagnosis not present

## 2022-08-28 DIAGNOSIS — N179 Acute kidney failure, unspecified: Secondary | ICD-10-CM | POA: Diagnosis not present

## 2022-08-28 LAB — HEPATIC FUNCTION PANEL
ALT: 23 U/L (ref 0–44)
AST: 20 U/L (ref 15–41)
Albumin: 3.1 g/dL — ABNORMAL LOW (ref 3.5–5.0)
Alkaline Phosphatase: 84 U/L (ref 38–126)
Bilirubin, Direct: <0.1 mg/dL (ref 0.0–0.2)
Total Bilirubin: 0.4 mg/dL (ref 0.3–1.2)
Total Protein: 6.2 g/dL — ABNORMAL LOW (ref 6.5–8.1)

## 2022-08-28 LAB — CBC
HCT: 35.5 % — ABNORMAL LOW (ref 39.0–52.0)
Hemoglobin: 12.1 g/dL — ABNORMAL LOW (ref 13.0–17.0)
MCH: 30.3 pg (ref 26.0–34.0)
MCHC: 34.1 g/dL (ref 30.0–36.0)
MCV: 88.8 fL (ref 80.0–100.0)
Platelets: 108 10*3/uL — ABNORMAL LOW (ref 150–400)
RBC: 4 MIL/uL — ABNORMAL LOW (ref 4.22–5.81)
RDW: 13.2 % (ref 11.5–15.5)
WBC: 12.2 10*3/uL — ABNORMAL HIGH (ref 4.0–10.5)
nRBC: 0 % (ref 0.0–0.2)

## 2022-08-28 LAB — BASIC METABOLIC PANEL
Anion gap: 14 (ref 5–15)
Anion gap: 15 (ref 5–15)
BUN: 72 mg/dL — ABNORMAL HIGH (ref 8–23)
BUN: 75 mg/dL — ABNORMAL HIGH (ref 8–23)
CO2: 29 mmol/L (ref 22–32)
CO2: 27 mmol/L (ref 22–32)
Calcium: 8.7 mg/dL — ABNORMAL LOW (ref 8.9–10.3)
Calcium: 8.7 mg/dL — ABNORMAL LOW (ref 8.9–10.3)
Chloride: 81 mmol/L — ABNORMAL LOW (ref 98–111)
Chloride: 80 mmol/L — ABNORMAL LOW (ref 98–111)
Creatinine, Ser: 2.12 mg/dL — ABNORMAL HIGH (ref 0.61–1.24)
Creatinine, Ser: 2.3 mg/dL — ABNORMAL HIGH (ref 0.61–1.24)
GFR, Estimated: 30 mL/min — ABNORMAL LOW (ref >60)
GFR, Estimated: 28 mL/min — ABNORMAL LOW (ref >60)
Glucose, Bld: 98 mg/dL (ref 70–99)
Glucose, Bld: 124 mg/dL — ABNORMAL HIGH (ref 70–99)
Potassium: 3.8 mmol/L (ref 3.5–5.1)
Potassium: 4.8 mmol/L (ref 3.5–5.1)
Sodium: 124 mmol/L — ABNORMAL LOW (ref 135–145)
Sodium: 122 mmol/L — ABNORMAL LOW (ref 135–145)

## 2022-08-28 LAB — MAGNESIUM: Magnesium: 2.2 mg/dL (ref 1.7–2.4)

## 2022-08-28 LAB — COOXEMETRY PANEL
Carboxyhemoglobin: 1.3 % (ref 0.5–1.5)
Methemoglobin: <0.7 % (ref 0.0–1.5)
O2 Saturation: 56.7 %
Total hemoglobin: 12.4 g/dL (ref 12.0–16.0)

## 2022-08-28 LAB — CORTISOL: Cortisol, Plasma: 18.7 ug/dL

## 2022-08-28 MED ORDER — LIDOCAINE IN D5W 4-5 MG/ML-% IV SOLN
1.0000 mg/min | INTRAVENOUS | Status: DC
  Administered 2022-08-28 – 2022-08-29 (×2): 1 mg/min via INTRAVENOUS
  Filled 2022-08-28: qty 500

## 2022-08-28 MED ORDER — AMIODARONE LOAD VIA INFUSION
150.0000 mg | Freq: Once | INTRAVENOUS | Status: AC
  Administered 2022-08-28: 150 mg via INTRAVENOUS

## 2022-08-28 MED ORDER — LIDOCAINE BOLUS VIA INFUSION
50.0000 mg | Freq: Once | INTRAVENOUS | Status: AC
  Administered 2022-08-28: 50 mg via INTRAVENOUS

## 2022-08-28 MED ORDER — LIDOCAINE IN D5W 4-5 MG/ML-% IV SOLN
INTRAVENOUS | Status: AC
  Administered 2022-08-28: 1 mg/min via INTRAVENOUS
  Filled 2022-08-28: qty 500

## 2022-08-28 MED ORDER — LIDOCAINE IN D5W 4-5 MG/ML-% IV SOLN
2.0000 mg/min | INTRAVENOUS | Status: DC
  Administered 2022-08-28: 1 mg/min via INTRAVENOUS

## 2022-08-28 MED ORDER — NOREPINEPHRINE 4 MG/250ML-% IV SOLN
INTRAVENOUS | Status: AC
  Filled 2022-08-28: qty 250

## 2022-08-28 MED ORDER — POTASSIUM CHLORIDE CRYS ER 20 MEQ PO TBCR
40.0000 meq | EXTENDED_RELEASE_TABLET | Freq: Once | ORAL | Status: DC
  Filled 2022-08-28: qty 2

## 2022-08-28 MED ORDER — LIDOCAINE BOLUS VIA INFUSION
100.0000 mg | Freq: Once | INTRAVENOUS | Status: AC
  Administered 2022-08-28: 100 mg via INTRAVENOUS

## 2022-08-28 MED ORDER — FENTANYL CITRATE PF 50 MCG/ML IJ SOSY
PREFILLED_SYRINGE | INTRAMUSCULAR | Status: AC
  Filled 2022-08-28: qty 2

## 2022-08-28 MED ORDER — TOLVAPTAN 15 MG PO TABS
15.0000 mg | ORAL_TABLET | Freq: Once | ORAL | Status: AC
  Administered 2022-08-28: 15 mg via ORAL
  Filled 2022-08-28: qty 1

## 2022-08-28 MED ORDER — POTASSIUM CHLORIDE CRYS ER 20 MEQ PO TBCR
40.0000 meq | EXTENDED_RELEASE_TABLET | Freq: Once | ORAL | Status: AC
  Administered 2022-08-28: 40 meq via ORAL
  Filled 2022-08-28: qty 2

## 2022-08-28 MED ORDER — MIDAZOLAM HCL 2 MG/2ML IJ SOLN
INTRAMUSCULAR | Status: AC
  Filled 2022-08-28: qty 4

## 2022-08-28 MED ORDER — RANOLAZINE ER 500 MG PO TB12
500.0000 mg | ORAL_TABLET | Freq: Two times a day (BID) | ORAL | Status: DC
  Administered 2022-08-28 – 2022-08-29 (×4): 500 mg via ORAL
  Filled 2022-08-28 (×5): qty 1

## 2022-08-28 NOTE — Plan of Care (Signed)
  Problem: Education: Goal: Knowledge of General Education information will improve Description: Including pain rating scale, medication(s)/side effects and non-pharmacologic comfort measures 08/28/2022 0607 by Andree Elk, RN Outcome: Progressing 08/28/2022 0607 by Andree Elk, RN Outcome: Progressing   Problem: Health Behavior/Discharge Planning: Goal: Ability to manage health-related needs will improve 08/28/2022 0607 by Andree Elk, RN Outcome: Progressing 08/28/2022 0607 by Andree Elk, RN Outcome: Progressing   Problem: Clinical Measurements: Goal: Ability to maintain clinical measurements within normal limits will improve 08/28/2022 0607 by Andree Elk, RN Outcome: Progressing 08/28/2022 0607 by Andree Elk, RN Outcome: Progressing Goal: Will remain free from infection 08/28/2022 0607 by Andree Elk, RN Outcome: Progressing 08/28/2022 0607 by Andree Elk, RN Outcome: Progressing Goal: Diagnostic test results will improve 08/28/2022 0607 by Andree Elk, RN Outcome: Progressing 08/28/2022 0607 by Andree Elk, RN Outcome: Progressing Goal: Respiratory complications will improve 08/28/2022 0607 by Andree Elk, RN Outcome: Progressing 08/28/2022 0607 by Andree Elk, RN Outcome: Progressing Goal: Cardiovascular complication will be avoided 08/28/2022 0607 by Andree Elk, RN Outcome: Progressing 08/28/2022 0607 by Andree Elk, RN Outcome: Progressing   Problem: Activity: Goal: Risk for activity intolerance will decrease 08/28/2022 0607 by Andree Elk, RN Outcome: Progressing 08/28/2022 0607 by Andree Elk, RN Outcome: Progressing   Problem: Coping: Goal: Level of anxiety will decrease 08/28/2022 0607 by Andree Elk, RN Outcome: Progressing 08/28/2022 0607 by Andree Elk, RN Outcome: Progressing   Problem: Elimination: Goal: Will not experience complications related to  bowel motility 08/28/2022 0607 by Andree Elk, RN Outcome: Progressing 08/28/2022 0607 by Andree Elk, RN Outcome: Progressing Goal: Will not experience complications related to urinary retention 08/28/2022 0607 by Andree Elk, RN Outcome: Progressing 08/28/2022 0607 by Andree Elk, RN Outcome: Progressing   Problem: Pain Managment: Goal: General experience of comfort will improve 08/28/2022 0607 by Andree Elk, RN Outcome: Progressing 08/28/2022 0607 by Andree Elk, RN Outcome: Progressing   Problem: Safety: Goal: Ability to remain free from injury will improve 08/28/2022 0607 by Andree Elk, RN Outcome: Progressing 08/28/2022 0607 by Andree Elk, RN Outcome: Progressing   Problem: Skin Integrity: Goal: Risk for impaired skin integrity will decrease 08/28/2022 0607 by Andree Elk, RN Outcome: Progressing 08/28/2022 0607 by Andree Elk, RN Outcome: Progressing   Problem: Education: Goal: Understanding of CV disease, CV risk reduction, and recovery process will improve 08/28/2022 0607 by Andree Elk, RN Outcome: Progressing 08/28/2022 0607 by Andree Elk, RN Outcome: Progressing Goal: Individualized Educational Video(s) 08/28/2022 0607 by Andree Elk, RN Outcome: Progressing 08/28/2022 0607 by Andree Elk, RN Outcome: Progressing   Problem: Activity: Goal: Ability to return to baseline activity level will improve 08/28/2022 0607 by Andree Elk, RN Outcome: Progressing 08/28/2022 0607 by Andree Elk, RN Outcome: Progressing   Problem: Cardiovascular: Goal: Ability to achieve and maintain adequate cardiovascular perfusion will improve 08/28/2022 0607 by Andree Elk, RN Outcome: Progressing 08/28/2022 0607 by Andree Elk, RN Outcome: Progressing Goal: Vascular access site(s) Level 0-1 will be maintained 08/28/2022 0607 by Andree Elk, RN Outcome: Progressing 08/28/2022  0607 by Andree Elk, RN Outcome: Progressing   Problem: Health Behavior/Discharge Planning: Goal: Ability to safely manage health-related needs after discharge will improve 08/28/2022 0607 by Andree Elk, RN Outcome: Progressing 08/28/2022 0607 by Andree Elk, RN Outcome: Progressing

## 2022-08-28 NOTE — Progress Notes (Signed)
PROGRESS NOTE    [name]  JYN:[id number] DOB: [birth year] DOA: 08/20/2022 PCP: Grant Perches, MD   Brief Narrative:  Grant Stevens was admitted to the hospital with the working diagnosis of cardiogenic shock.  82 yo male with the past medical history of coronary artery disease, sp CABG, heart failure, and conduction system disease sp pacemaker who presented with diarrhea for 24 hrs. Endorsed intermittent chest pain for the last 3 days prior to admission, associated with dyspnea and diaphoresis. In the ED his blood pressure was 87/75, HR 77, rr 22 and 02 saturation 82%, lungs with no wheezing or reales, heart with S1 and S2 present and regular with no gallops, rubs or murmurs, abdomen with no distention and no lower extremity edema.  Na 134, K 5.2 Cl 99, bicarbonate 20, glucose 105 bun 56 cr 3,78  AST 38 ALT 66  High sensitive troponin 13,275, 8,096   Lactic acid 2,3  Wbc 11,8 hgb 12.3 plt 114  Urine analysis SG 1,006, negative protein, negative hgb, negative leukocytes.   Chest radiograph with cardiomegaly, bilateral hilar vascular congestion, bilateral atelectasis at bases, pacemaker in place with one right atrial and one right ventricular lead. Sternotomy wires in place.   #1 EKG 71 bpm, left axis deviation, interventricular conduction delay, qtc 621, ventricular paced rhythm with no significant ST segment or T wave changes.   #2 EKG 121 bpm, normal axis, right bundle branch block, qtc 626, monomorphic ventricular tachycardia, no significant ST segment or T wave changes.  #3 EKG 62 bpm, normal axis, interventricular conduction delay, qtc 533, ventricular paced rhythm, no significant  ST segment changes, negative T wave V5 and V6.   Patient was placed on norepinephrine infusion and heparin infusion.  Transferred from [facility] to [facility] ICU.   Echocardiogram with reduced LV systolic function, milrinone was added for cardiogenic shock. Amiodarone drip for ventricular tachycardia.    Diuresis with furosemide and metolazone.  Not candidate for advance therapies.   08/12 off norepinephrine.  08/13 transfer to [facility]   **Interim History  Cath done and showed patent LIMA-LAD and SVG-RCA, occluded SVG-diagonal with Medical Management recommended. Subsequently he has had recurrent ventricular tachycardia and cardiology has now reinitiated amiodarone and started him on a lidocaine drip.  He subsequently converted back to normal sinus rhythm without shock but cardiology does not try a dose of tolvaptan holding diuresis today.  Assessment and Plan:  Acute on Chronic systolic CHF with cardiogenic shock -Advanced heart failure team following -TTE 8/11: LVEF <20% with severely reduced RV -Diuresed with IV Lasix and milrinone, now off milrinone -10 L since admission and weight down by about 15 pounds.  Intake/Output Summary (Last 24 hours) at 08/28/2022 1558 Last data filed at 08/28/2022 1500 Gross per 24 hour  Intake 1541.88 ml  Output 625 ml  Net 916.88 ml  -Off GDMT due to shock and acute kidney injury.  - Not a candidate for advanced therapies per cardiology. -Initially plans to defer cath, subsequently had sustained V. tach, LHC yesterday noted patent LIMA to LAD, SVG to PDA but occluded SVG to diagonal, normal filling pressures, medical management recommended -Patient had a CVP of 10-11 today and the advanced heart failure team recommending not to aggressively diurese given that he had a trickle tachycardia -Cardiology they worry that he may be at the end of stage situation and feel that a left bundle lead may help him but he needs to be stabilized from a rhythm standpoint further given his recurrent  Grant Stevens -The cardiology team still discussed with his primary EP physician Dr. Ladona Stevens on Monday   CAD with NSTEMI -History of CAD s/p CABG -Cardiology suspects that he had an outside of the hospital infarct with loss of SVG-D - opponent's were elevated and greater than  13,000 -Completed heparin x 48 hours. -Continue DAPT with Aspirin 81 mg po daily and Clopidogrel 75 mg po Daily -LHC showed patent LIMA-LAD and SVG-RCA, occluded SVG-diagonal. -Medical management recommended and cardiology added ranolazine 500 g twice daily for antiarrhythmic and anti-ischemic properties   Ventricular Tachycardia -Recurrent episode 8/16, 10 minutes of sustained V. Tach and had another sustained ventricular tachycardia -Amio gtt Restarted being continued at 60 mg/h and now receiving a lidocaine drip -Further care per Cardiology Teams and ranolazine has been added for an antiarrhythmic and active ischemic property -Per cardiology the patient does not want an ICD and he is limited DNR   Hyperkalemia: -Secondary to AKI.  Resolved. -K+ Trend: Recent Labs  Lab 08/26/22 0428 08/26/22 1534 08/26/22 1541 08/27/22 0042 08/27/22 0936 08/27/22 1652 08/28/22 0455  K 3.8 3.9 4.3  4.3 4.4 4.3 4.5 3.8  -Continue to Monitor and Trend and repeat CMP in the AM   Chronic Right Knee Pain: -Suspect osteoarthritis.  No history of trauma or gout. -Continue with as needed acetaminophen 650 mg every 6 as needed for mild to moderate pain and topical Voltaren gel.   Hyponatremia -Multifactorial due to decompensated CHF, diuretics, AKI etc. -Down to 121 a few days ago, plan for tolvaptan -Na+ Trend: Recent Labs  Lab 08/26/22 0428 08/26/22 1534 08/26/22 1541 08/27/22 0042 08/27/22 0936 08/27/22 1652 08/28/22 0455  NA 125* 124* 121*  122* 121* 124* 123* 124*  -Clinically appears euvolemic, -check urine sodium and osmolarity, did not receive diuretics yesterday and will not receive him today, decrease Tegretol dose which also can lower sodium and check a.m. cortisol -Cardiology will initiate tolvaptan 15 mg x 1 for his hyponatremia   Sick sinus syndrome -S/p pacemaker with paced rhythm on telemetry at bedside -Continue to monitor carefully and cardiology is following   Remote  history of A-fib -Not on anticoagulation.  Per cardiology if recurs, would need anticoagulation. -Currently on a amiodarone drip -His heparin drip has now been discontinued and he is on dual antiplatelet therapy as above   Insomnia: -C/w Mirtazepine 15 mg po qHS   Nausea and vomiting: -Unclear etiology.   -Improved with supportive care, monitor, add laxatives he has been constipated for 5 days -Currently not on any Antiemetics   Acute kidney injury on CKD Stage 3a due to cardiogenic shock and ATN -Baseline serum creatinine 1.1.   -Creatinine peaked to 3.8, trending down but slightly worsening -BUN/Cr Trend: Recent Labs  Lab 08/24/22 2151 08/25/22 0429 08/26/22 0428 08/26/22 1912 08/27/22 0042 08/27/22 0936 08/27/22 1652 08/28/22 0455  BUN 64* 65* 68*  --  72* 70* 74* 72*  CREATININE 2.04* 1.92* 1.97* 1.91* 1.82* 1.92* 2.19* 2.12*  -Avoid Nephrotoxic Medications, Contrast Dyes, Hypotension and Dehydration to Ensure Adequate Renal Perfusion and will need to Renally Adjust Meds -Continue to Monitor and Trend Renal Function carefully and repeat CMP in the AM   Leukocytosis -Likely Reactive from above -WBC Trend: Recent Labs  Lab 08/23/22 0445 08/24/22 1514 08/25/22 0429 08/26/22 0428 08/26/22 1912 08/27/22 0042 08/28/22 0455  WBC 12.2* 13.7* 10.0 9.0 10.8* 11.1* 12.2*  -Continue to monitor for signs and symptoms of infection; no overt infection noted  GERD/GI Prophylaxis -C/w PPI  with Pantoprazole 40 mg po daily   Normocytic Anemia -Hgb/Hct Trend: Recent Labs  Lab 08/25/22 0429 08/26/22 0428 08/26/22 1534 08/26/22 1541 08/26/22 1912 08/27/22 0042 08/28/22 0455  HGB 12.6* 12.5* 12.9* 13.6  13.6 13.2 12.6* 12.1*  HCT 36.2* 36.4* 38.0* 40.0  40.0 38.7* 36.4* 35.5*  MCV 89.6 90.3  --   --  89.0 90.1 88.8  -Check Anemia Panel in the AM -Continue to Monitor for S/Sx of Bleeding; No overt bleeding noted -Repeat CBC in the AM  Thrombocytopenia -Platelet  Count Trend: Recent Labs  Lab 08/23/22 0445 08/24/22 1514 08/25/22 0429 08/26/22 0428 08/26/22 1912 08/27/22 0042 08/28/22 0455  PLT 105* 123* 115* 110* 99* 104* 108*  -Continue to Monitor for S/Sx of Bleeding; No overt bleeding noted -Repeat CBC int the AM  Hypoalbuminemia -Patient's Albumin Trend: Recent Labs  Lab 08/20/22 1224 08/24/22 2151 08/27/22 0936 08/28/22 0950  ALBUMIN 4.2 3.6 3.6 3.1*  -Continue to Monitor and Trend and repeat CMP in the AM  Overweight -Complicates overall prognosis and care -Estimated body mass index is 27.6 kg/m as calculated from the following:   Height as of this encounter: 6' (1.829 m).   Weight as of this encounter: 92.3 kg.  -Weight Loss and Dietary Counseling given   DVT prophylaxis: heparin injection 5,000 Units Start: 08/27/22 0600 Place TED hose Start: 08/21/22 1158 SCDs Start: 08/21/22 1146    Code Status: DNR Family Communication: No family present at bedside  Disposition Plan:  Level of care: Progressive Status is: Inpatient Remains inpatient appropriate because: Needs further clinical Improvement and Clearance by the Specialists    Consultants:  Advanced Heart Failure Team EP Cardiology PCCM Transfer  Procedures:  CARDIAC CATHETERIZATION   Ost LAD to Prox LAD lesion is 100% stenosed.   Prox LAD to Mid LAD lesion is 90% stenosed.   Ost RCA to Dist RCA lesion is 100% stenosed.   Origin to Prox Graft lesion is 100% stenosed.   LIMA graft was visualized by angiography and is large.   SVG graft was visualized by angiography.   SVG graft was visualized by angiography and is normal in caliber.   The graft exhibits no disease.   The graft exhibits no disease.   LV end diastolic pressure is normal.   Severe 2 vessel occlusive CAD. The left main and LCx are widely patent.  Patent LIMA to the LAD. This does supply some flow into the first diagonal Occluded SVG to the diagonal Patent SVG to the right PDA Normal LV  filling pressures. PCWP 12/14 mean 11 mm Hg. LVEDP 16 mm Hg Normal right  heart pressures. PAP 28/13 mean 21 mm Hg Cardiac output 5.08 L/min, index 2.37   Plan: medical management.   Antimicrobials:  Anti-infectives (From admission, onward)    None       Subjective: Seen and examined at bedside and states she is feeling better after the episodes of this morning.  He had recurrent ventricular tachycardia this morning.  No nausea or vomiting currently.  Feels okay.  Denies any lightheadedness or dizziness.  No other concerns requested this time.  Objective: Vitals:   08/28/22 1300 08/28/22 1400 08/28/22 1500 08/28/22 1547  BP:  119/80 98/72   Pulse:      Resp: 15 19 (!) 24   Temp:    97.6 F (36.4 C)  TempSrc:    Oral  SpO2: 100% 100% 100%   Weight:      Height:  Intake/Output Summary (Last 24 hours) at 08/28/2022 1608 Last data filed at 08/28/2022 1500 Gross per 24 hour  Intake 1405.19 ml  Output 625 ml  Net 780.19 ml   Filed Weights   08/26/22 0500 08/27/22 0500 08/28/22 0500  Weight: 91.8 kg 91.5 kg 92.3 kg   Examination: Physical Exam:  Constitutional: WN/WD overweight elderly Caucasian male in no acute distress Respiratory: Diminished to auscultation bilaterally, no wheezing, rales, rhonchi or crackles. Normal respiratory effort and patient is not tachypenic. No accessory muscle use.  Unlabored breathing Cardiovascular: RRR, no murmurs / rubs / gallops. S1 and S2 auscultated. No extremity edema.  Abdomen: Soft, non-tender, distended secondary to body habitus. Bowel sounds positive.  GU: Deferred. Musculoskeletal: No clubbing / cyanosis of digits/nails. No joint deformity upper and lower extremities. Good ROM, no contractures. Skin: No rashes, lesions, ulcers on a limited skin evaluation. No induration; Warm and dry.  Neurologic: CN 2-12 grossly intact with no focal deficits. Romberg sign and cerebellar reflexes not assessed.  Psychiatric: Normal judgment and  insight. Alert and oriented x 3. Normal mood and appropriate affect.   Data Reviewed: I have personally reviewed following labs and imaging studies  CBC: Recent Labs  Lab 08/25/22 0429 08/26/22 0428 08/26/22 1534 08/26/22 1541 08/26/22 1912 08/27/22 0042 08/28/22 0455  WBC 10.0 9.0  --   --  10.8* 11.1* 12.2*  HGB 12.6* 12.5* 12.9* 13.6  13.6 13.2 12.6* 12.1*  HCT 36.2* 36.4* 38.0* 40.0  40.0 38.7* 36.4* 35.5*  MCV 89.6 90.3  --   --  89.0 90.1 88.8  PLT 115* 110*  --   --  99* 104* 108*   Basic Metabolic Panel: Recent Labs  Lab 08/22/22 2328 08/23/22 0445 08/24/22 0408 08/24/22 2151 08/25/22 0429 08/26/22 0428 08/26/22 1534 08/26/22 1541 08/26/22 1912 08/27/22 0042 08/27/22 0936 08/27/22 1652 08/28/22 0455  NA 126*   < > 125*   < > 126* 125*   < > 121*  122*  --  121* 124* 123* 124*  K 4.9   < > 3.9   < > 4.2 3.8   < > 4.3  4.3  --  4.4 4.3 4.5 3.8  CL 87*   < > 81*   < > 80* 81*  --   --   --  80* 81* 80* 81*  CO2 29   < > 30   < > 32 30  --   --   --  27 28 28 29   GLUCOSE 101*   < > 95   < > 100* 87  --   --   --  86 110* 125* 98  BUN 66*   < > 65*   < > 65* 68*  --   --   --  72* 70* 74* 72*  CREATININE 2.49*   < > 2.16*   < > 1.92* 1.97*  --   --  1.91* 1.82* 1.92* 2.19* 2.12*  CALCIUM 8.8*   < > 9.0   < > 9.0 8.6*  --   --   --  8.5* 9.0 9.1 8.7*  MG 2.1   < > 1.9  --  1.9 1.9  --   --   --  2.4  --   --  2.2  PHOS 4.2  --   --   --   --   --   --   --   --   --   --   --   --    < > =  values in this interval not displayed.   GFR: Estimated Creatinine Clearance: 29.5 mL/min (A) (by C-G formula based on SCr of 2.12 mg/dL (H)). Liver Function Tests: Recent Labs  Lab 08/24/22 2151 08/27/22 0936 08/28/22 0950  AST 22 22 20   ALT 38 29 23  ALKPHOS 83 96 84  BILITOT 0.5 0.7 0.4  PROT 6.6 6.9 6.2*  ALBUMIN 3.6 3.6 3.1*   No results for input(s): "LIPASE", "AMYLASE" in the last 168 hours. No results for input(s): "AMMONIA" in the last 168  hours. Coagulation Profile: No results for input(s): "INR", "PROTIME" in the last 168 hours. Cardiac Enzymes: No results for input(s): "CKTOTAL", "CKMB", "CKMBINDEX", "TROPONINI" in the last 168 hours. BNP (last 3 results) No results for input(s): "PROBNP" in the last 8760 hours. HbA1C: No results for input(s): "HGBA1C" in the last 72 hours. CBG: Recent Labs  Lab 08/24/22 1119 08/24/22 1550  GLUCAP 108* 110*   Lipid Profile: No results for input(s): "CHOL", "HDL", "LDLCALC", "TRIG", "CHOLHDL", "LDLDIRECT" in the last 72 hours. Thyroid Function Tests: No results for input(s): "TSH", "T4TOTAL", "FREET4", "T3FREE", "THYROIDAB" in the last 72 hours. Anemia Panel: No results for input(s): "VITAMINB12", "FOLATE", "FERRITIN", "TIBC", "IRON", "RETICCTPCT" in the last 72 hours. Sepsis Labs: No results for input(s): "PROCALCITON", "LATICACIDVEN" in the last 168 hours.  Recent Results (from the past 240 hour(s))  Culture, blood (routine x 2)     Status: None   Collection Time: 08/20/22 12:24 PM   Specimen: Right Antecubital; Blood  Result Value Ref Range Status   Specimen Description   Final    RIGHT ANTECUBITAL BOTTLES DRAWN AEROBIC AND ANAEROBIC   Special Requests Blood Culture adequate volume  Final   Culture   Final    NO GROWTH 5 DAYS Performed at [facility], [address] [place], [place] [address]    Report Status 08/25/2022 FINAL  Final  Culture, blood (routine x 2)     Status: None   Collection Time: 08/20/22 12:24 PM   Specimen: Left Antecubital; Blood  Result Value Ref Range Status   Specimen Description   Final    LEFT ANTECUBITAL BOTTLES DRAWN AEROBIC AND ANAEROBIC   Special Requests Blood Culture adequate volume  Final   Culture   Final    NO GROWTH 5 DAYS Performed at [facility], [address], [place], [place] [address]    Report Status 08/25/2022 FINAL  Final  MRSA Next Gen by PCR, Nasal     Status: None   Collection Time: 08/20/22  4:01 PM   Specimen:  Nasal Mucosa; Nasal Swab  Result Value Ref Range Status   MRSA by PCR Next Gen NOT DETECTED NOT DETECTED Final    Comment: (NOTE) The GeneXpert MRSA Assay (FDA approved for NASAL specimens only), is one component of a comprehensive MRSA colonization surveillance program. It is not intended to diagnose MRSA infection nor to guide or monitor treatment for MRSA infections. Test performance is not FDA approved in patients less than 82 years old. Performed at [facility], [address] [place], [place] [address]   Respiratory (~20 pathogens) panel by PCR     Status: None   Collection Time: 08/22/22  7:38 AM   Specimen: Nasopharyngeal Swab; Respiratory  Result Value Ref Range Status   Adenovirus NOT DETECTED NOT DETECTED Final   Coronavirus 229E NOT DETECTED NOT DETECTED Final    Comment: (NOTE) The Coronavirus on the Respiratory Panel, DOES NOT test for the novel  Coronavirus (2019  nCoV)    Coronavirus HKU1 NOT DETECTED NOT DETECTED Final   Coronavirus NL63 NOT DETECTED NOT DETECTED Final   Coronavirus OC43 NOT DETECTED NOT DETECTED Final   Metapneumovirus NOT DETECTED NOT DETECTED Final   Rhinovirus / Enterovirus NOT DETECTED NOT DETECTED Final   Influenza A NOT DETECTED NOT DETECTED Final   Influenza B NOT DETECTED NOT DETECTED Final   Parainfluenza Virus 1 NOT DETECTED NOT DETECTED Final   Parainfluenza Virus 2 NOT DETECTED NOT DETECTED Final   Parainfluenza Virus 3 NOT DETECTED NOT DETECTED Final   Parainfluenza Virus 4 NOT DETECTED NOT DETECTED Final   Respiratory Syncytial Virus NOT DETECTED NOT DETECTED Final   Bordetella pertussis NOT DETECTED NOT DETECTED Final   Bordetella Parapertussis NOT DETECTED NOT DETECTED Final   Chlamydophila pneumoniae NOT DETECTED NOT DETECTED Final   Mycoplasma pneumoniae NOT DETECTED NOT DETECTED Final    Comment: Performed at [facility], [address] [place], [place] [address]  SARS Coronavirus 2 by RT PCR (hospital order,  performed in [facility] hospital lab) *cepheid single result test* Anterior Nasal Swab     Status: None   Collection Time: 08/22/22  7:38 AM   Specimen: Anterior Nasal Swab  Result Value Ref Range Status   SARS Coronavirus 2 by RT PCR NEGATIVE NEGATIVE Final    Comment: Performed at [facility], [address], [place], [place] [address]    Radiology Studies: DG CHEST PORT 1 VIEW  Result Date: 08/27/2022 CLINICAL DATA:  PICC line placement. EXAM: PORTABLE CHEST 1 VIEW COMPARISON:  08/20/2022 FINDINGS: Left-sided pacemaker unchanged. Sternotomy wires unchanged. Interval placement of right-sided PICC line with tip in the region of the cavoatrial junction. Lungs are hypoinflated with minimal linear left basilar density likely atelectasis. No effusion or pneumothorax. Cardiomediastinal silhouette and remainder of the exam is unchanged. IMPRESSION: 1. Hypoinflation with minimal linear left basilar density likely atelectasis. 2. Right-sided PICC line with tip in the region of the cavoatrial junction. Electronically Signed   By: Elberta Fortis M.D.   On: 08/27/2022 14:07   [place] EKG SITE RITE  Result Date: 08/27/2022 If Site Rite image not attached, placement could not be confirmed due to current cardiac rhythm.   Scheduled Meds:  aspirin EC  81 mg Oral Daily   atorvastatin  80 mg Oral QHS   bisacodyl  10 mg Rectal Once   carbamazepine  200 mg Oral BID   Chlorhexidine Gluconate Cloth  6 each Topical Daily   clopidogrel  75 mg Oral Daily   divalproex  250 mg Oral BID   fentaNYL       heparin  5,000 Units Subcutaneous Q8H   melatonin  3 mg Oral QHS   mirtazapine  15 mg Oral QHS   pantoprazole  40 mg Oral QAC breakfast   polyethylene glycol  17 g Oral BID   ranolazine  500 mg Oral BID   senna-docusate  2 tablet Oral BID   sodium chloride flush  10-40 mL Intracatheter Q12H   sodium chloride flush  3 mL Intravenous Q12H   temazepam  15 mg Oral QHS   Continuous Infusions:  sodium chloride  Stopped (08/20/22 1740)   sodium chloride     sodium chloride     amiodarone 60 mg/hr (08/28/22 1500)   lidocaine 1 mg/min (08/28/22 1538)   norepinephrine      LOS: 8 days   Marguerita Merles, DO [facility] Available via Epic secure chat 7am-7pm After  these hours, please refer to coverage provider listed on [email or web address] 08/28/2022, 4:08 PM

## 2022-08-28 NOTE — Progress Notes (Addendum)
Patient Profile: Grant Stevens is a 82 y.o. male with a history of CAD s/p CABG 2002, HFrED, ICM  and SSS s/p  CRT-PPM (failed LV lead)  2022 stage 4 kidney disaease a/C who is being seen 8/16 for the evaluation of VT ins etting of STEMI  at the request of Dr. Gala Romney . CAth >> patent LIMA SVG >>RCA patent GDMT  stopped by hypotension  Profound Hyponatremia (worsening)  Echo EF < 20% << 25-30%   QRSd 250 msec  Medical therapy initiated and ICD declined      Patient Name: Grant Stevens   Events overnight included recurring of ventricular tachycardia about 0650h coming a short burst of nonsustained, fellow gave lidocaine (great choice) with widening of the VT only for to recur again   SUBJECTIVE: feels better and stronger and less SOB this am  But then shortly thereafter went into ventricular tachycardia developed chest pain lightheadedness and shortness of breath.  Now with sinus again feeling better  Past Medical History:  Diagnosis Date   Allergic rhinitis    Arthritis    Asthma    Childhood   Atrial fibrillation (HCC)    Remote history - WARCEF   Bladder cancer (HCC)    BPH (benign prostatic hyperplasia)    Cardiomyopathy (HCC)    CHF (congestive heart failure) (HCC)    a. EF 30-35% by echo in 2018 and 10/2018, at 25-30% in 05/2020   Coronary atherosclerosis of native coronary artery    Multivessel s/p CABG in 2002 post-IMI, LVEF 35% up to 50% postoperatively;   Depression    Difficulty sleeping    Erectile dysfunction    Essential hypertension    Frequency of urination    History of bipolar disorder    Hyperlipidemia    IBS (irritable bowel syndrome)    Myocardial infarction (HCC) 2002    Scheduled Meds:  Scheduled Meds:  aspirin EC  81 mg Oral Daily   atorvastatin  80 mg Oral QHS   bisacodyl  10 mg Rectal Once   carbamazepine  200 mg Oral BID   Chlorhexidine Gluconate Cloth  6 each Topical Daily   clopidogrel  75 mg Oral Daily   divalproex  250  mg Oral BID   heparin  5,000 Units Subcutaneous Q8H   melatonin  3 mg Oral QHS   mirtazapine  15 mg Oral QHS   pantoprazole  40 mg Oral QAC breakfast   polyethylene glycol  17 g Oral BID   senna-docusate  2 tablet Oral BID   sodium chloride flush  10-40 mL Intracatheter Q12H   sodium chloride flush  3 mL Intravenous Q12H   sorbitol  30 mL Oral Once   temazepam  15 mg Oral QHS   Continuous Infusions:  sodium chloride Stopped (08/20/22 1740)   sodium chloride     sodium chloride     amiodarone 30 mg/hr (08/28/22 0500)   lidocaine 1 mg/min (08/28/22 0708)   Place/Maintain arterial line **AND** sodium chloride, sodium chloride, acetaminophen, mouth rinse, sodium chloride flush, sodium chloride flush    PHYSICAL EXAM Vitals:   08/28/22 0545 08/28/22 0600 08/28/22 0715 08/28/22 0740  BP:  118/89 (!) 116/103   Pulse:      Resp: 15 12 15    Temp:    97.7 F (36.5 C)  TempSrc:    Oral  SpO2: 95% 95% 99%   Weight:      Height:       Well  developed and well nourished in no acute distress HENT normal Neck supple with JVP-flat Clear Device pocket well healed; without hematoma or erythema.  There is no tethering  Regular rate and rhythm, no  murmur Abd-soft with active BS No Clubbing cyanosis  edema Skin-warm and dry A & Oriented  Grossly normal sensory and motor function       TELEMETRY: Reviewed personnally pt in AV pacing with long AV delay:  ECG personally reviewed    Intake/Output Summary (Last 24 hours) at 08/28/2022 0800 Last data filed at 08/28/2022 0500 Gross per 24 hour  Intake 872.68 ml  Output 775 ml  Net 97.68 ml    LABS: Basic Metabolic Panel: Recent Labs  Lab 08/22/22 2328 08/23/22 0445 08/24/22 2151 08/25/22 0429 08/26/22 0428 08/26/22 1534 08/26/22 1541 08/26/22 1912 08/27/22 0042 08/27/22 0936 08/27/22 1652 08/28/22 0455  NA 126*   < > 125* 126* 125* 124* 121*  122*  --  121* 124* 123* 124*  K 4.9   < > 4.2 4.2 3.8 3.9 4.3  4.3  --   4.4 4.3 4.5 3.8  CL 87*   < > 79* 80* 81*  --   --   --  80* 81* 80* 81*  CO2 29   < > 30 32 30  --   --   --  27 28 28 29   GLUCOSE 101*   < > 160* 100* 87  --   --   --  86 110* 125* 98  BUN 66*   < > 64* 65* 68*  --   --   --  72* 70* 74* 72*  CREATININE 2.49*   < > 2.04* 1.92* 1.97*  --   --  1.91* 1.82* 1.92* 2.19* 2.12*  CALCIUM 8.8*   < > 8.9 9.0 8.6*  --   --   --  8.5* 9.0 9.1 8.7*  MG 2.1   < >  --  1.9 1.9  --   --   --  2.4  --   --  2.2  PHOS 4.2  --   --   --   --   --   --   --   --   --   --   --    < > = values in this interval not displayed.   Cardiac Enzymes: No results for input(s): "CKTOTAL", "CKMB", "CKMBINDEX", "TROPONINI" in the last 72 hours. CBC: Recent Labs  Lab 08/23/22 0445 08/24/22 1514 08/25/22 0429 08/26/22 0428 08/26/22 1534 08/26/22 1541 08/26/22 1912 08/27/22 0042 08/28/22 0455  WBC 12.2* 13.7* 10.0 9.0  --   --  10.8* 11.1* 12.2*  HGB 11.8* 12.8* 12.6* 12.5* 12.9* 13.6  13.6 13.2 12.6* 12.1*  HCT 34.6* 37.1* 36.2* 36.4* 38.0* 40.0  40.0 38.7* 36.4* 35.5*  MCV 89.4 88.5 89.6 90.3  --   --  89.0 90.1 88.8  PLT 105* 123* 115* 110*  --   --  99* 104* 108*   PROTIME: No results for input(s): "LABPROT", "INR" in the last 72 hours. Liver Function Tests: Recent Labs    08/27/22 0936  AST 22  ALT 29  ALKPHOS 96  BILITOT 0.7  PROT 6.9  ALBUMIN 3.6   No results for input(s): "LIPASE", "AMYLASE" in the last 72 hours. BNP: BNP (last 3 results) Recent Labs    08/21/22 0441  BNP 2,520.5*       Device Interrogation: AV reprogrammed 350>>200    ASSESSMENT AND PLAN:  Ventricular tachycardia slow   NSTemi  CAD.Prior CABG  Pacemaker DOI 2022  Complete heart block   Hyponatremia  QRSd 250   A/C renal insuffciency Estimated Creatinine Clearance: 29.5 mL/min (A) (by C-G formula based on SCr of 2.12 mg/dL (H)).  Recurrent ventricular tachycardia short burst as well as longer bursts.  The idea of lidocaine in the setting of what  is likely a triggered VT is reasonable, I think however that lidocaine is fraught with side effects issues and his older man.  I was going to discontinue it, in fact I did, with recurrence of ventricular tachycardia.  He was rebolused with another 50 mg of lidocaine and is now on 2 mg/min with the plan now that he is settled back into sinus rhythm to decrease it at about 1500 hours to 1 mg/min.  He will need a lidocaine level in the morning.  He has also been rebolused with amiodarone.  Discussed this with Dr. DM, cardioversion is not likely going to be of any benefit as we saw brief reversion to sinus rhythm and then recurrence of ventricular tachycardia consistent with a trigger mechanism.  The trigger mechanism suggest that ischemia is likely the trigger and so we will add ranolazine as an adjunctive anti-ischemic agent.    Will address the question tomorrow, depending on how the patient proceeds, as to whether there is a role for left bundle branch area pacing  Hyponatremia is better   Device is also noted to be pacing at 90 bpm.  Not quite sure where that came from, have reprogrammed is a 75 this morning   Signed, Sherryl Manges MD  08/28/2022

## 2022-08-28 NOTE — Progress Notes (Signed)
Patient ID: Grant Stevens, male   DOB: Mar 15, 1940, 82 y.o.   MRN: [id number]     Advanced Heart Failure Rounding Note  PCP-Cardiologist: Nona Dell, MD   Subjective:    Recurrent VT this morning, initially slow in 110s, accelerated to 150s.  SBP 80s and patient had some chest pain.  He had salvos of VT with short NSR bursts in between.  Bolused amiodarone and bolused lidocaine.  He eventually converted back to NSR without shock, we continued lidocaine at 2 mg/min and amiodarone at 60 mg/hr.    Co-ox 57%, CVP 10-11.  Na higher today at 124, had a dose of tolvaptan yesterday.  Less nauseated.  Creatinine higher 1.82 => 2.12.    LHC 8/16: Patent LIMA-LAD and SVG-PDA but occluded SVG-D (new, suspect culprit for NSTEMI).  RA mean 7, LVEDP 16, CI 2.37  Objective:   Weight Range: 92.3 kg Body mass index is 27.6 kg/m.   Vital Signs:   Temp:  [97.7 F (36.5 C)-98.3 F (36.8 C)] 97.7 F (36.5 C) (08/18 0740) Resp:  [0-32] 15 (08/18 0715) BP: (95-140)/(64-104) 116/103 (08/18 0715) SpO2:  [92 %-100 %] 99 % (08/18 0715) Weight:  [92.3 kg] 92.3 kg (08/18 0500) Last BM Date : 08/28/22  Weight change: Filed Weights   08/26/22 0500 08/27/22 0500 08/28/22 0500  Weight: 91.8 kg 91.5 kg 92.3 kg    Intake/Output:   Intake/Output Summary (Last 24 hours) at 08/28/2022 0832 Last data filed at 08/28/2022 0700 Gross per 24 hour  Intake 922.94 ml  Output 775 ml  Net 147.94 ml      Physical Exam    General: NAD, frail Neck: JVP 9-10 cm, no thyromegaly or thyroid nodule.  Lungs: Clear to auscultation bilaterally with normal respiratory effort. CV: Nondisplaced PMI.  Heart regular S1/S2, no S3/S4, no murmur.  No peripheral edema.   Abdomen: Soft, nontender, no hepatosplenomegaly, no distention.  Skin: Intact without lesions or rashes.  Neurologic: Alert and oriented x 3.  Psych: Normal affect. Extremities: No clubbing or cyanosis.  HEENT: Normal.   Telemetry   A-V sequential  pacing with runs of VT (personally reviewed)  Labs    CBC Recent Labs    08/27/22 0042 08/28/22 0455  WBC 11.1* 12.2*  HGB 12.6* 12.1*  HCT 36.4* 35.5*  MCV 90.1 88.8  PLT 104* 108*   Basic Metabolic Panel Recent Labs    78/29/56 0042 08/27/22 0936 08/27/22 1652 08/28/22 0455  NA 121*   < > 123* 124*  K 4.4   < > 4.5 3.8  CL 80*   < > 80* 81*  CO2 27   < > 28 29  GLUCOSE 86   < > 125* 98  BUN 72*   < > 74* 72*  CREATININE 1.82*   < > 2.19* 2.12*  CALCIUM 8.5*   < > 9.1 8.7*  MG 2.4  --   --  2.2   < > = values in this interval not displayed.   Liver Function Tests Recent Labs    08/27/22 0936  AST 22  ALT 29  ALKPHOS 96  BILITOT 0.7  PROT 6.9  ALBUMIN 3.6    No results for input(s): "LIPASE", "AMYLASE" in the last 72 hours.  Cardiac Enzymes No results for input(s): "CKTOTAL", "CKMB", "CKMBINDEX", "TROPONINI" in the last 72 hours.  BNP: BNP (last 3 results) Recent Labs    08/21/22 0441  BNP 2,520.5*    ProBNP (last 3 results) No results  for input(s): "PROBNP" in the last 8760 hours.   D-Dimer No results for input(s): "DDIMER" in the last 72 hours. Hemoglobin A1C No results for input(s): "HGBA1C" in the last 72 hours.  Fasting Lipid Panel No results for input(s): "CHOL", "HDL", "LDLCALC", "TRIG", "CHOLHDL", "LDLDIRECT" in the last 72 hours.  Thyroid Function Tests No results for input(s): "TSH", "T4TOTAL", "T3FREE", "THYROIDAB" in the last 72 hours.  Invalid input(s): "FREET3"   Other results:   Imaging    DG CHEST PORT 1 VIEW  Result Date: 08/27/2022 CLINICAL DATA:  PICC line placement. EXAM: PORTABLE CHEST 1 VIEW COMPARISON:  08/20/2022 FINDINGS: Left-sided pacemaker unchanged. Sternotomy wires unchanged. Interval placement of right-sided PICC line with tip in the region of the cavoatrial junction. Lungs are hypoinflated with minimal linear left basilar density likely atelectasis. No effusion or pneumothorax. Cardiomediastinal  silhouette and remainder of the exam is unchanged. IMPRESSION: 1. Hypoinflation with minimal linear left basilar density likely atelectasis. 2. Right-sided PICC line with tip in the region of the cavoatrial junction. Electronically Signed   By: Elberta Fortis M.D.   On: 08/27/2022 14:07   [place] EKG SITE RITE  Result Date: 08/27/2022 If Site Rite image not attached, placement could not be confirmed due to current cardiac rhythm.    Medications:     Scheduled Medications:  aspirin EC  81 mg Oral Daily   atorvastatin  80 mg Oral QHS   bisacodyl  10 mg Rectal Once   carbamazepine  200 mg Oral BID   Chlorhexidine Gluconate Cloth  6 each Topical Daily   clopidogrel  75 mg Oral Daily   divalproex  250 mg Oral BID   fentaNYL       heparin  5,000 Units Subcutaneous Q8H   melatonin  3 mg Oral QHS   midazolam       mirtazapine  15 mg Oral QHS   pantoprazole  40 mg Oral QAC breakfast   polyethylene glycol  17 g Oral BID   potassium chloride  40 mEq Oral Once   ranolazine  500 mg Oral BID   senna-docusate  2 tablet Oral BID   sodium chloride flush  10-40 mL Intracatheter Q12H   sodium chloride flush  3 mL Intravenous Q12H   sorbitol  30 mL Oral Once   temazepam  15 mg Oral QHS   tolvaptan  15 mg Oral Once    Infusions:  sodium chloride Stopped (08/20/22 1740)   sodium chloride     sodium chloride     amiodarone 60 mg/hr (08/28/22 0826)   lidocaine 2 mg/min (08/28/22 0829)   norepinephrine      PRN Medications: Place/Maintain arterial line **AND** sodium chloride, sodium chloride, acetaminophen, fentaNYL, midazolam, norepinephrine, mouth rinse, sodium chloride flush, sodium chloride flush   Assessment/Plan    1. Acute on chronic systolic HF -> cardiogenic shock - In setting of OOH infarct (loss of SVG-D) - Echo 2/24 EF 25-30% RV mildly reduced - Echo 08/21/22: EF < 20% RV severely reduced - Initial co-ox 31%. Started on milrinone and diuresed w/ IV Lasix - Milrinone stopped 8/15.   - Diuresed well. Down total of 14 lb. Switch to po Torsemide 20 mg daily then stopped 8/16. - GDMT limited by soft BP and AKI  - No ? blocker yet w/ marginal output  - Not candidate for advanced therapies with age - CVP 10-11 today.  Will not aggressively diurese with VT runs.  Will give tolvaptan 15 mg x 1 today  for hyponatremia which should give some gentle diuresis.  - Co-ox 57% today with creatinine higher at 2.12.  Marginal cardiac output.  I will not start milrinone today given VT runs as worry about arrhythmogenicity.  - Discussed with Dr. Graciela Husbands.  Has very wide paced 250 msec QRS (chronic RV pacing), favor eventual left bundle lead placement (unable to place CS lead in past), will need to discuss with his primary EP physician Dr. Ladona Ridgel on Monday.  - Frail with marginal cardiac output and episodes of VT.  I worry that we may be at an end stage situation.  Left bundle lead may help him, but need to stabilize him from a rhythm standpoint first.   2. CAD with NSTEMI - h/o CAD s/p CABG  - suspect OOH infarct with loss of SVG-D - HS troponin > 13K - had heparin x 48 hours, now Goehner heparin.  - DAPT/statin  - LHC showed patent LIMA-LAD and SVG-RCA, new occlusion SVG-D without interventional option.  - Adding ranolazine 500 mg bid today for anti-arrhythmic and anti-ischemic properties.    3. Ventricular tachycardia - Episode again on 8/16, amiodarone gtt restarted.   - Suspect ischemia-mediated VT, seen by EP.  - Salvos of slow VT in 110s then faster VT in 150s today separate by short sinus bursts.  Discussed with Dr. Graciela Husbands, suspect triggered by ischemia (recent occlusion SVG-D with ischemic penumbra). He was never shocked this morning, bolused and started on lidocaine gtt and bolused amiodarone.  - Adding ranolazine as above for anti-arrhythmic and anti-ischemic properties.  - Continue lidocaine gtt 2 mg/min this morning, decrease to 1 mg/min this afternoon and check level in am.  - Continue  amiodarone gtt at 60 mg/hr - Replace K.  - Patient clear that he does not want an ICD and is a limited DNR (see below).    4. AKI - due to shock/ATN - baseline Scr 1.1 , peaked at 3.8. 1.82 => 2.12 today.  Rise may be related to contrast load with cath on Friday.  - With rising creatinine and marginal CO, will not diurese today besides use of tolvaptan.  CVP 10-11.    5. SSS/CHB - s/p PPM, unable to place CS lead in past.  Has wide paced QRS 250 msec. Chronic RV pacing.  - As above, would like left bundle lead placement after stabilization.   6. Remote history of atrial fibrillation - Has not been anticoagulated - No recurrence this admission. Would need anticoagulation if recurs.  7. Hyponatremia - Higher today at 124 after tolvaptan yesterday  - Fluid restrict 1200 cc for now.  - CVP 10-11 today, will give dose of tolvaptan 15 mg again.  Repeat pm BMET.     8. Limited DNR - Ok with intubation and defibrillation  - Would not want CPR  9. Deconditioning  - will need CIR at d/c  - Try to mobilize today.   CRITICAL CARE Performed by: Marca Ancona  Total critical care time: 50 minutes  Critical care time was exclusive of separately billable procedures and treating other patients.  Critical care was necessary to treat or prevent imminent or life-threatening deterioration.  Critical care was time spent personally by me (independent of midlevel providers or residents) on the following activities: development of treatment plan with patient and/or surrogate as well as nursing, discussions with consultants, evaluation of patient's response to treatment, examination of patient, obtaining history from patient or surrogate, ordering and performing treatments and interventions, ordering and review of  laboratory studies, ordering and review of radiographic studies, pulse oximetry and re-evaluation of patient's condition.  Marca Ancona, MD  8:32 AM

## 2022-08-29 ENCOUNTER — Inpatient Hospital Stay (HOSPITAL_COMMUNITY): Payer: Medicare HMO

## 2022-08-29 ENCOUNTER — Encounter (HOSPITAL_COMMUNITY): Payer: Self-pay | Admitting: Cardiology

## 2022-08-29 DIAGNOSIS — I472 Ventricular tachycardia, unspecified: Secondary | ICD-10-CM | POA: Diagnosis not present

## 2022-08-29 DIAGNOSIS — R570 Cardiogenic shock: Secondary | ICD-10-CM | POA: Diagnosis not present

## 2022-08-29 DIAGNOSIS — I214 Non-ST elevation (NSTEMI) myocardial infarction: Secondary | ICD-10-CM | POA: Diagnosis not present

## 2022-08-29 DIAGNOSIS — I5023 Acute on chronic systolic (congestive) heart failure: Secondary | ICD-10-CM | POA: Diagnosis not present

## 2022-08-29 LAB — RETICULOCYTES
Immature Retic Fract: 6 % (ref 2.3–15.9)
RBC.: 3.42 MIL/uL — ABNORMAL LOW (ref 4.22–5.81)
Retic Count, Absolute: 38.3 10*3/uL (ref 19.0–186.0)
Retic Ct Pct: 1.1 % (ref 0.4–3.1)

## 2022-08-29 LAB — COOXEMETRY PANEL
Carboxyhemoglobin: 1.7 % — ABNORMAL HIGH (ref 0.5–1.5)
Methemoglobin: 0.7 % (ref 0.0–1.5)
O2 Saturation: 64.4 %
Total hemoglobin: 11.1 g/dL — ABNORMAL LOW (ref 12.0–16.0)

## 2022-08-29 LAB — CBC WITH DIFFERENTIAL/PLATELET
Abs Immature Granulocytes: 0.05 10*3/uL (ref 0.00–0.07)
Basophils Absolute: 0 10*3/uL (ref 0.0–0.1)
Basophils Relative: 0 %
Eosinophils Absolute: 0.1 10*3/uL (ref 0.0–0.5)
Eosinophils Relative: 1 %
HCT: 31.5 % — ABNORMAL LOW (ref 39.0–52.0)
Hemoglobin: 10.7 g/dL — ABNORMAL LOW (ref 13.0–17.0)
Immature Granulocytes: 1 %
Lymphocytes Relative: 5 %
Lymphs Abs: 0.5 10*3/uL — ABNORMAL LOW (ref 0.7–4.0)
MCH: 30.2 pg (ref 26.0–34.0)
MCHC: 34 g/dL (ref 30.0–36.0)
MCV: 89 fL (ref 80.0–100.0)
Monocytes Absolute: 0.8 10*3/uL (ref 0.1–1.0)
Monocytes Relative: 8 %
Neutro Abs: 7.9 10*3/uL — ABNORMAL HIGH (ref 1.7–7.7)
Neutrophils Relative %: 85 %
Platelets: 91 10*3/uL — ABNORMAL LOW (ref 150–400)
RBC: 3.54 MIL/uL — ABNORMAL LOW (ref 4.22–5.81)
RDW: 13.2 % (ref 11.5–15.5)
WBC: 9.3 10*3/uL (ref 4.0–10.5)
nRBC: 0 % (ref 0.0–0.2)

## 2022-08-29 LAB — CBC
HCT: 34.1 % — ABNORMAL LOW (ref 39.0–52.0)
Hemoglobin: 11.6 g/dL — ABNORMAL LOW (ref 13.0–17.0)
MCH: 30.1 pg (ref 26.0–34.0)
MCHC: 34 g/dL (ref 30.0–36.0)
MCV: 88.6 fL (ref 80.0–100.0)
Platelets: 95 10*3/uL — ABNORMAL LOW (ref 150–400)
RBC: 3.85 MIL/uL — ABNORMAL LOW (ref 4.22–5.81)
RDW: 13.3 % (ref 11.5–15.5)
WBC: 10.4 10*3/uL (ref 4.0–10.5)
nRBC: 0 % (ref 0.0–0.2)

## 2022-08-29 LAB — BASIC METABOLIC PANEL
Anion gap: 17 — ABNORMAL HIGH (ref 5–15)
Anion gap: 12 (ref 5–15)
BUN: 66 mg/dL — ABNORMAL HIGH (ref 8–23)
BUN: 70 mg/dL — ABNORMAL HIGH (ref 8–23)
CO2: 26 mmol/L (ref 22–32)
CO2: 28 mmol/L (ref 22–32)
Calcium: 8.2 mg/dL — ABNORMAL LOW (ref 8.9–10.3)
Calcium: 8.6 mg/dL — ABNORMAL LOW (ref 8.9–10.3)
Chloride: 77 mmol/L — ABNORMAL LOW (ref 98–111)
Chloride: 81 mmol/L — ABNORMAL LOW (ref 98–111)
Creatinine, Ser: 2.35 mg/dL — ABNORMAL HIGH (ref 0.61–1.24)
Creatinine, Ser: 2.34 mg/dL — ABNORMAL HIGH (ref 0.61–1.24)
GFR, Estimated: 27 mL/min — ABNORMAL LOW (ref >60)
GFR, Estimated: 27 mL/min — ABNORMAL LOW (ref >60)
Glucose, Bld: 368 mg/dL — ABNORMAL HIGH (ref 70–99)
Glucose, Bld: 97 mg/dL (ref 70–99)
Potassium: 3.6 mmol/L (ref 3.5–5.1)
Potassium: 3.8 mmol/L (ref 3.5–5.1)
Sodium: 120 mmol/L — ABNORMAL LOW (ref 135–145)
Sodium: 121 mmol/L — ABNORMAL LOW (ref 135–145)

## 2022-08-29 LAB — COMPREHENSIVE METABOLIC PANEL
ALT: 19 U/L (ref 0–44)
AST: 19 U/L (ref 15–41)
Albumin: 2.9 g/dL — ABNORMAL LOW (ref 3.5–5.0)
Alkaline Phosphatase: 77 U/L (ref 38–126)
Anion gap: 14 (ref 5–15)
BUN: 66 mg/dL — ABNORMAL HIGH (ref 8–23)
CO2: 27 mmol/L (ref 22–32)
Calcium: 7.8 mg/dL — ABNORMAL LOW (ref 8.9–10.3)
Chloride: 76 mmol/L — ABNORMAL LOW (ref 98–111)
Creatinine, Ser: 2.3 mg/dL — ABNORMAL HIGH (ref 0.61–1.24)
GFR, Estimated: 28 mL/min — ABNORMAL LOW (ref >60)
Glucose, Bld: 359 mg/dL — ABNORMAL HIGH (ref 70–99)
Potassium: 3.6 mmol/L (ref 3.5–5.1)
Sodium: 117 mmol/L — CL (ref 135–145)
Total Bilirubin: 0.5 mg/dL (ref 0.3–1.2)
Total Protein: 5.6 g/dL — ABNORMAL LOW (ref 6.5–8.1)

## 2022-08-29 LAB — PHOSPHORUS: Phosphorus: 3.7 mg/dL (ref 2.5–4.6)

## 2022-08-29 LAB — IRON AND TIBC
Iron: 64 ug/dL (ref 45–182)
Saturation Ratios: 25 % (ref 17.9–39.5)
TIBC: 256 ug/dL (ref 250–450)
UIBC: 192 ug/dL

## 2022-08-29 LAB — LIDOCAINE LEVEL: Lidocaine Lvl: >48 ug/mL — ABNORMAL HIGH (ref 1.5–5.0)

## 2022-08-29 LAB — GLUCOSE, CAPILLARY
Glucose-Capillary: 121 mg/dL — ABNORMAL HIGH (ref 70–99)
Glucose-Capillary: 110 mg/dL — ABNORMAL HIGH (ref 70–99)
Glucose-Capillary: 106 mg/dL — ABNORMAL HIGH (ref 70–99)
Glucose-Capillary: 120 mg/dL — ABNORMAL HIGH (ref 70–99)

## 2022-08-29 LAB — LIPOPROTEIN A (LPA): Lipoprotein (a): 102.1 nmol/L — ABNORMAL HIGH (ref <75.0)

## 2022-08-29 LAB — TSH: TSH: 6.222 u[IU]/mL — ABNORMAL HIGH (ref 0.350–4.500)

## 2022-08-29 LAB — OSMOLALITY: Osmolality: 282 mOsm/kg (ref 275–295)

## 2022-08-29 LAB — SODIUM: Sodium: 117 mmol/L — CL (ref 135–145)

## 2022-08-29 LAB — FERRITIN: Ferritin: 48 ng/mL (ref 24–336)

## 2022-08-29 LAB — FOLATE: Folate: 23.5 ng/mL (ref >5.9)

## 2022-08-29 LAB — MAGNESIUM: Magnesium: 2.1 mg/dL (ref 1.7–2.4)

## 2022-08-29 LAB — VITAMIN B12: Vitamin B-12: 741 pg/mL (ref 180–914)

## 2022-08-29 MED ORDER — SODIUM CHLORIDE 1 G PO TABS
1.0000 g | ORAL_TABLET | Freq: Three times a day (TID) | ORAL | Status: DC
  Administered 2022-08-29: 1 g via ORAL
  Filled 2022-08-29: qty 1

## 2022-08-29 MED ORDER — TEMAZEPAM 15 MG PO CAPS
30.0000 mg | ORAL_CAPSULE | Freq: Every day | ORAL | Status: DC
  Administered 2022-08-29 – 2022-09-01 (×4): 30 mg via ORAL
  Filled 2022-08-29 (×4): qty 2

## 2022-08-29 MED ORDER — INSULIN ASPART 100 UNIT/ML IJ SOLN: 0.0000 [IU] | Freq: Three times a day (TID) | INTRAMUSCULAR | Status: DC

## 2022-08-29 MED ORDER — INSULIN ASPART 100 UNIT/ML IJ SOLN: 3.0000 [IU] | Freq: Three times a day (TID) | INTRAMUSCULAR | Status: DC

## 2022-08-29 MED ORDER — SODIUM CHLORIDE 1 G PO TABS
1.0000 g | ORAL_TABLET | Freq: Three times a day (TID) | ORAL | Status: AC
  Administered 2022-08-29: 1 g via ORAL
  Filled 2022-08-29: qty 1

## 2022-08-29 MED ORDER — INSULIN ASPART 100 UNIT/ML IJ SOLN: 0.0000 [IU] | Freq: Every day | INTRAMUSCULAR | Status: DC

## 2022-08-29 MED ORDER — MELATONIN 5 MG PO TABS
5.0000 mg | ORAL_TABLET | Freq: Every day | ORAL | Status: DC
  Administered 2022-08-29 – 2022-09-01 (×4): 5 mg via ORAL
  Filled 2022-08-29 (×4): qty 1

## 2022-08-29 MED ORDER — POTASSIUM CHLORIDE CRYS ER 20 MEQ PO TBCR
40.0000 meq | EXTENDED_RELEASE_TABLET | ORAL | Status: AC
  Administered 2022-08-29 (×2): 40 meq via ORAL
  Filled 2022-08-29 (×2): qty 2

## 2022-08-29 NOTE — Progress Notes (Signed)
Lidocaine level >45 D/w Dr. Ladona Ridgel Will stop gtt Consider mexiletine perhaps tomorrow night  Francis Dowse, PA-C

## 2022-08-29 NOTE — Progress Notes (Addendum)
PROGRESS NOTE   Grant Stevens  ION:[id number]    DOB: Apr 03, 1940    DOA: 08/20/2022  PCP: Carylon Perches, MD   Brief Narrative:  81 year old male with PMH of CAD, s/p CABG, chronic systolic CHF, s/p PPM, presented to ED on 08/20/2022 with complaints of diarrhea for 24 hours.  Also had intermittent chest pain with associated dyspnea and diaphoresis.  Hypotensive in the ED to 87/75 and hypoxic with oxygen saturation of 82%.  Admitted for acute on chronic systolic heart failure with cardiogenic shock, CAD with possible NSTEMI complicated by acute kidney injury.  Cardiology and nephrology consulting.  Treated with  IV milrinone drip and IV Lasix. -Volume status improved, milrinone discontinued 8/15 -Discharge planned 8/16 -Subsequently had prolonged monomorphic V. tach, discharge canceled, left heart cath noted occluded SVG to diagonal, medical management recommended, started on Amio gtt., EP consulted -8/18 with recurrent VT: EP following, bolused with lidocaine, currently on lidocaine drip along with Amio gtt. -Ongoing hyponatremia -Prognosis poor-Palliative consulted 8/19  Subjective:  -Feels fair, no events overnight  Assessment & Plan:   Acute on chronic systolic CHF with cardiogenic shock Advanced heart failure team following -TTE 8/11: LVEF <20% with severely reduced RV -Diuresed with IV Lasix and milrinone, now off milrinone -10 L since admission and weight down by about 15 pounds. -Off GDMT due to shock and acute kidney injury.  - Not a candidate for advanced therapies per cardiology. -Initially plans to defer cath, subsequently had sustained V. tach, LHC 8/16 noted patent LIMA to LAD, SVG to PDA but occluded SVG to diagonal, normal filling pressures, medical management recommended -Poor prognosis, discussed CODE STATUS again, he is agreeable to full DNR, palliative consult, left msg for sister  CAD with possible NSTEMI History of CAD s/p CABG Completed heparin x 48 hours. Continue  DAPT with aspirin and Plavix. LHC showed patent LIMA-LAD and SVG-RCA, occluded SVG-diagonal.,  Medical management recommended -Ranexa added 8/18  Ventricular tachycardia -Recurrent episode 8/16, 10 minutes of sustained V. Tach -Amio gtt. Restarted -8/18 recurrent VT, amiodarone drip increased to 60 Mg per hour, bolused with lidocaine and started on lidocaine gtt. -Potassium low, repleted -Now full DNR  Hyperglycemia -AM labs with CBGs in the 300s, had been less than 100 all of last week, with A1c of 6.0 -Suspect this is related to carrier fluid with lidocaine (5% dextrose) -Will check with pharmacy about alternate options  Acute kidney injury due to cardiogenic shock and ATN Baseline serum creatinine 1.1.  Creatinine peaked to 3.8, trended down to 1.8, now back up to 2.3  Hyperkalemia: Secondary to AKI.  Resolved.  Chronic right knee pain: Suspect osteoarthritis.  No history of trauma or gout. As needed Tylenol and topical Voltaren gel.  Hyponatremia Multifactorial due to decompensated CHF, diuretics, AKI etc. -Sodium down to 120 this morning, received 2 doses of tolvaptan in the last few days, hyperglycemia could also be contributing, starting insulin today, see discussion above -?  Low output  Sick sinus syndrome S/p pacemaker with paced rhythm on telemetry at bedside  Remote history of A-fib Not on anticoagulation.  Per cardiology if recurs, would need anticoagulation.  GERD: PPI.  Anemia and thrombocytopenia: Stable.  Insomnia: -Remeron  Nausea and vomiting: Unclear etiology.   -Improved with supportive care, -On laxatives for constipation   Body mass index is 28.11 kg/m.   DVT prophylaxis: Hep SQ   Code Status: DNR: With limited code, discussed CODE STATUS again 8/19, now agreeable to full DNR Family Communication: None  at bedside, unable to reach close friend Huntley Dec Disposition: SNF next week depending on clinical course     Consultants:    Cardiology/advanced heart failure cardiologist Nephrology  Procedures:     Antimicrobials:     Objective:   Vitals:   08/29/22 0400 08/29/22 0500 08/29/22 0600 08/29/22 0738  BP:  112/75    Pulse:  75    Resp: 17 10 17    Temp:    97.8 F (36.6 C)  TempSrc:    Oral  SpO2: 100% 99% 96%   Weight:  94 kg    Height:        Weak chronically ill male sitting up in bed, AAOx3 HEENT: No JVD CVS: S1-S2, regular rhythm Lungs: Decreased breath sounds at the bases Abdomen: Soft, nontender, bowel sounds present Extremities: Trace edema  Skin: no new rashes on exposed skin     Data Reviewed:   I have personally reviewed following labs and imaging studies   CBC: Recent Labs  Lab 08/28/22 0455 08/29/22 0442 08/29/22 0749  WBC 12.2* 9.3 10.4  NEUTROABS  --  7.9*  --   HGB 12.1* 10.7* 11.6*  HCT 35.5* 31.5* 34.1*  MCV 88.8 89.0 88.6  PLT 108* 91* 95*    Basic Metabolic Panel: Recent Labs  Lab 08/22/22 2328 08/23/22 0445 08/25/22 0429 08/26/22 0428 08/26/22 1534 08/27/22 0042 08/27/22 0936 08/27/22 1652 08/28/22 0455 08/28/22 1700 08/29/22 0442 08/29/22 0749  NA 126*   < > 126* 125*   < > 121*   < > 123* 124* 122* 117* 120*  K 4.9   < > 4.2 3.8   < > 4.4   < > 4.5 3.8 4.8 3.6 3.6  CL 87*   < > 80* 81*  --  80*   < > 80* 81* 80* 76* 77*  CO2 29   < > 32 30  --  27   < > 28 29 27 27 26   GLUCOSE 101*   < > 100* 87  --  86   < > 125* 98 124* 359* 368*  BUN 66*   < > 65* 68*  --  72*   < > 74* 72* 75* 66* 66*  CREATININE 2.49*   < > 1.92* 1.97*   < > 1.82*   < > 2.19* 2.12* 2.30* 2.30* 2.35*  CALCIUM 8.8*   < > 9.0 8.6*  --  8.5*   < > 9.1 8.7* 8.7* 7.8* 8.2*  MG 2.1   < > 1.9 1.9  --  2.4  --   --  2.2  --  2.1  --   PHOS 4.2  --   --   --   --   --   --   --   --   --  3.7  --    < > = values in this interval not displayed.    Liver Function Tests: Recent Labs  Lab 08/24/22 2151 08/27/22 0936 08/28/22 0950 08/29/22 0442  AST 22 22 20 19   ALT 38 29 23  19   ALKPHOS 83 96 84 77  BILITOT 0.5 0.7 0.4 0.5  PROT 6.6 6.9 6.2* 5.6*  ALBUMIN 3.6 3.6 3.1* 2.9*    CBG: Recent Labs  Lab 08/24/22 1119 08/24/22 1550  GLUCAP 108* 110*    Microbiology Studies:   Recent Results (from the past 240 hour(s))  Culture, blood (routine x 2)     Status: None   Collection Time: 08/20/22  12:24 PM   Specimen: Right Antecubital; Blood  Result Value Ref Range Status   Specimen Description   Final    RIGHT ANTECUBITAL BOTTLES DRAWN AEROBIC AND ANAEROBIC   Special Requests Blood Culture adequate volume  Final   Culture   Final    NO GROWTH 5 DAYS Performed at [facility], [address], [place], [place] [address]    Report Status 08/25/2022 FINAL  Final  Culture, blood (routine x 2)     Status: None   Collection Time: 08/20/22 12:24 PM   Specimen: Left Antecubital; Blood  Result Value Ref Range Status   Specimen Description   Final    LEFT ANTECUBITAL BOTTLES DRAWN AEROBIC AND ANAEROBIC   Special Requests Blood Culture adequate volume  Final   Culture   Final    NO GROWTH 5 DAYS Performed at [facility], [address] [place], [place] [address]    Report Status 08/25/2022 FINAL  Final  MRSA Next Gen by PCR, Nasal     Status: None   Collection Time: 08/20/22  4:01 PM   Specimen: Nasal Mucosa; Nasal Swab  Result Value Ref Range Status   MRSA by PCR Next Gen NOT DETECTED NOT DETECTED Final    Comment: (NOTE) The GeneXpert MRSA Assay (FDA approved for NASAL specimens only), is one component of a comprehensive MRSA colonization surveillance program. It is not intended to diagnose MRSA infection nor to guide or monitor treatment for MRSA infections. Test performance is not FDA approved in patients less than 85 years old. Performed at [facility], [address] [place], [place] [address]   Respiratory (~20 pathogens) panel by PCR     Status: None   Collection Time: 08/22/22  7:38 AM   Specimen: Nasopharyngeal Swab; Respiratory   Result Value Ref Range Status   Adenovirus NOT DETECTED NOT DETECTED Final   Coronavirus 229E NOT DETECTED NOT DETECTED Final    Comment: (NOTE) The Coronavirus on the Respiratory Panel, DOES NOT test for the novel  Coronavirus (2019 nCoV)    Coronavirus HKU1 NOT DETECTED NOT DETECTED Final   Coronavirus NL63 NOT DETECTED NOT DETECTED Final   Coronavirus OC43 NOT DETECTED NOT DETECTED Final   Metapneumovirus NOT DETECTED NOT DETECTED Final   Rhinovirus / Enterovirus NOT DETECTED NOT DETECTED Final   Influenza A NOT DETECTED NOT DETECTED Final   Influenza B NOT DETECTED NOT DETECTED Final   Parainfluenza Virus 1 NOT DETECTED NOT DETECTED Final   Parainfluenza Virus 2 NOT DETECTED NOT DETECTED Final   Parainfluenza Virus 3 NOT DETECTED NOT DETECTED Final   Parainfluenza Virus 4 NOT DETECTED NOT DETECTED Final   Respiratory Syncytial Virus NOT DETECTED NOT DETECTED Final   Bordetella pertussis NOT DETECTED NOT DETECTED Final   Bordetella Parapertussis NOT DETECTED NOT DETECTED Final   Chlamydophila pneumoniae NOT DETECTED NOT DETECTED Final   Mycoplasma pneumoniae NOT DETECTED NOT DETECTED Final    Comment: Performed at [facility], [address], [place], [place] [address]  SARS Coronavirus 2 by RT PCR (hospital order, performed in [facility] hospital lab) *cepheid single result test* Anterior Nasal Swab     Status: None   Collection Time: 08/22/22  7:38 AM   Specimen: Anterior Nasal Swab  Result Value Ref Range Status   SARS Coronavirus 2 by RT PCR NEGATIVE NEGATIVE Final    Comment: Performed at [facility], [address], [place], [place] [address]    Radiology Studies:  DG CHEST PORT 1 VIEW  Result Date: 08/27/2022 CLINICAL DATA:  PICC line placement. EXAM: PORTABLE CHEST 1 VIEW COMPARISON:  08/20/2022 FINDINGS: Left-sided pacemaker unchanged. Sternotomy wires unchanged. Interval placement of right-sided PICC line with tip in the region of the cavoatrial  junction. Lungs are hypoinflated with minimal linear left basilar density likely atelectasis. No effusion or pneumothorax. Cardiomediastinal silhouette and remainder of the exam is unchanged. IMPRESSION: 1. Hypoinflation with minimal linear left basilar density likely atelectasis. 2. Right-sided PICC line with tip in the region of the cavoatrial junction. Electronically Signed   By: Elberta Fortis M.D.   On: 08/27/2022 14:07    Scheduled Meds:    aspirin EC  81 mg Oral Daily   atorvastatin  80 mg Oral QHS   bisacodyl  10 mg Rectal Once   carbamazepine  200 mg Oral BID   Chlorhexidine Gluconate Cloth  6 each Topical Daily   clopidogrel  75 mg Oral Daily   divalproex  250 mg Oral BID   heparin  5,000 Units Subcutaneous Q8H   insulin aspart  0-5 Units Subcutaneous QHS   insulin aspart  0-9 Units Subcutaneous TID WC   insulin aspart  3 Units Subcutaneous TID WC   melatonin  5 mg Oral QHS   mirtazapine  15 mg Oral QHS   pantoprazole  40 mg Oral QAC breakfast   polyethylene glycol  17 g Oral BID   potassium chloride  40 mEq Oral Q4H   ranolazine  500 mg Oral BID   senna-docusate  2 tablet Oral BID   sodium chloride flush  10-40 mL Intracatheter Q12H   sodium chloride flush  3 mL Intravenous Q12H   temazepam  30 mg Oral QHS    Continuous Infusions:    sodium chloride Stopped (08/20/22 1740)   sodium chloride     sodium chloride     amiodarone 60 mg/hr (08/29/22 0600)   lidocaine 1 mg/min (08/29/22 0600)     LOS: 9 days     Zannie Cove, MD,   [facility]  08/29/2022, 9:08 AM

## 2022-08-29 NOTE — Progress Notes (Signed)
Serum sodium is 117  Blood glucose is 120.  Patient received 2 doses of tolvaptan 15 mg for last  last 2 days.   - Starting salt tabs 1 g 3 times daily for 1 day.  Addendum: -As the sodium has been improved from 117 to 123 over the span of 5 hours after 1 g of salt tab I will hold further salt tab at this time to prevent rapid overcorrection.  -Patient has been already on fluid restriction 1.2 L for last couple of days.  Plan to continue that.  monitor serum sodium level. -Please reach out for nephrology consult in the a.m. for the management of hyponatremia.  Tereasa Coop, MD [facility] 08/29/2022, 10:32 PM

## 2022-08-29 NOTE — Progress Notes (Addendum)
Bedside nurse is calling reporting me that patient sodium level has been dropped 122 to 117. Per chart review who has been admitted on 08/20/2022 at that time serum sodium was 134 over the course of last 2 weeks serum sodium has been gradually dropped to 117. Initially patient has been admitted for acute CHF exacerbation with cardiogenic shock and managed with diuretics.  Currently diuretics on hold because of persistently dropping sodium level.  -For hyponatremia patient is is on fluid restriction 1.2 L/day and received 1 dose of tolvaptan 15 mg 08/28/2022.  Still sodium is dropping.  Starting salt tabs 1 g 3 times daily.  Continue to check sodium level every 6 hour. -Please reach out to nephrology in the a.m. for further recommendation and management of hyponatremia.    Tereasa Coop, MD [facility] 08/29/2022, 6:18 AM

## 2022-08-29 NOTE — Progress Notes (Addendum)
Rounding Note    Patient Name: Grant Stevens Date of Encounter: 08/29/2022  [facility] Cardiologist: Nona Dell, MD   Subjective   OOB > chair Hard to sleep here No CP, no rest SOB weak  Inpatient Medications    Scheduled Meds:  aspirin EC  81 mg Oral Daily   atorvastatin  80 mg Oral QHS   bisacodyl  10 mg Rectal Once   carbamazepine  200 mg Oral BID   Chlorhexidine Gluconate Cloth  6 each Topical Daily   clopidogrel  75 mg Oral Daily   divalproex  250 mg Oral BID   heparin  5,000 Units Subcutaneous Q8H   melatonin  5 mg Oral QHS   mirtazapine  15 mg Oral QHS   pantoprazole  40 mg Oral QAC breakfast   polyethylene glycol  17 g Oral BID   potassium chloride  40 mEq Oral Q4H   ranolazine  500 mg Oral BID   senna-docusate  2 tablet Oral BID   sodium chloride flush  10-40 mL Intracatheter Q12H   sodium chloride flush  3 mL Intravenous Q12H   temazepam  30 mg Oral QHS   Continuous Infusions:  sodium chloride Stopped (08/20/22 1740)   sodium chloride     sodium chloride     amiodarone 60 mg/hr (08/29/22 0600)   lidocaine 1 mg/min (08/29/22 0600)   PRN Meds: Place/Maintain arterial line **AND** sodium chloride, sodium chloride, acetaminophen, mouth rinse, sodium chloride flush, sodium chloride flush   Vital Signs    Vitals:   08/29/22 0400 08/29/22 0500 08/29/22 0600 08/29/22 0738  BP:  112/75    Pulse:  75    Resp: 17 10 17    Temp:    97.8 F (36.6 C)  TempSrc:    Oral  SpO2: 100% 99% 96%   Weight:  94 kg    Height:        Intake/Output Summary (Last 24 hours) at 08/29/2022 0818 Last data filed at 08/29/2022 0600 Gross per 24 hour  Intake 1457.88 ml  Output 775 ml  Net 682.88 ml      08/29/2022    5:00 AM 08/28/2022    5:00 AM 08/27/2022    5:00 AM  Last 3 Weights  Weight (lbs) 207 lb 3.7 oz 203 lb 7.8 oz 201 lb 11.5 oz  Weight (kg) 94 kg 92.3 kg 91.5 kg      Telemetry    AV paced, no VT since yesterday afternoon -  Personally Reviewed  ECG    Yesterday VT at 115bpm - Personally Reviewed  Physical Exam   GEN: No acute distress.   Neck: No JVD Cardiac: RRR, no murmurs, rubs, or gallops.  Respiratory: crackles at the bases. GI: Soft, nontender, non-distended  MS: No edema; No deformity. Neuro:  Nonfocal  Psych: Normal affect   Labs    High Sensitivity Troponin:   Recent Labs  Lab 08/20/22 1224 08/21/22 1211  TROPONINIHS 13,275* 8,096*     Chemistry Recent Labs  Lab 08/27/22 0042 08/27/22 0936 08/27/22 1652 08/28/22 0455 08/28/22 0950 08/28/22 1700 08/29/22 0442  NA 121* 124*   < > 124*  --  122* 117*  K 4.4 4.3   < > 3.8  --  4.8 3.6  CL 80* 81*   < > 81*  --  80* 76*  CO2 27 28   < > 29  --  27 27  GLUCOSE 86 110*   < > 98  --  124* 359*  BUN 72* 70*   < > 72*  --  75* 66*  CREATININE 1.82* 1.92*   < > 2.12*  --  2.30* 2.30*  CALCIUM 8.5* 9.0   < > 8.7*  --  8.7* 7.8*  MG 2.4  --   --  2.2  --   --  2.1  PROT  --  6.9  --   --  6.2*  --  5.6*  ALBUMIN  --  3.6  --   --  3.1*  --  2.9*  AST  --  22  --   --  20  --  19  ALT  --  29  --   --  23  --  19  ALKPHOS  --  96  --   --  84  --  77  BILITOT  --  0.7  --   --  0.4  --  0.5  GFRNONAA 37* 34*   < > 30*  --  28* 28*  ANIONGAP 14 15   < > 14  --  15 14   < > = values in this interval not displayed.    Lipids No results for input(s): "CHOL", "TRIG", "HDL", "LABVLDL", "LDLCALC", "CHOLHDL" in the last 168 hours.  Hematology Recent Labs  Lab 08/27/22 0042 08/28/22 0455 08/29/22 0442  WBC 11.1* 12.2* 9.3  RBC 4.04* 4.00* 3.54*  3.42*  HGB 12.6* 12.1* 10.7*  HCT 36.4* 35.5* 31.5*  MCV 90.1 88.8 89.0  MCH 31.2 30.3 30.2  MCHC 34.6 34.1 34.0  RDW 13.2 13.2 13.2  PLT 104* 108* 91*   Thyroid No results for input(s): "TSH", "FREET4" in the last 168 hours.  BNPNo results for input(s): "BNP", "PROBNP" in the last 168 hours.  DDimer No results for input(s): "DDIMER" in the last 168 hours.   Radiology    DG CHEST  PORT 1 VIEW  Result Date: 08/27/2022 CLINICAL DATA:  PICC line placement. EXAM: PORTABLE CHEST 1 VIEW COMPARISON:  08/20/2022 FINDINGS: Left-sided pacemaker unchanged. Sternotomy wires unchanged. Interval placement of right-sided PICC line with tip in the region of the cavoatrial junction. Lungs are hypoinflated with minimal linear left basilar density likely atelectasis. No effusion or pneumothorax. Cardiomediastinal silhouette and remainder of the exam is unchanged. IMPRESSION: 1. Hypoinflation with minimal linear left basilar density likely atelectasis. 2. Right-sided PICC line with tip in the region of the cavoatrial junction. Electronically Signed   By: Elberta Fortis M.D.   On: 08/27/2022 14:07   [place] EKG SITE RITE  Result Date: 08/27/2022 If Site Rite image not attached, placement could not be confirmed due to current cardiac rhythm.   Cardiac Studies   LHC 8/16: Patent LIMA-LAD and SVG-PDA but occluded SVG-D (new, suspect culprit for NSTEMI).  RA mean 7, LVEDP 16, CI 2.37  Echo 08/21/22: EF < 20% RV severely reduced   Patient Profile     82 y.o. male w/PMHx of CAD s/p CABG 2002, HFrED, ICM, and SSS s/p PPM 2022, CKD (IV)    admitted 8/10 with chest pain and hypotension. Had diarrhea preceding.   He went to [facility] and HS trop was >13K. Presentation consistent with cardiogenic shock with hypotension, lactacte of 2.4, Cr 3.8 (Baseline 1.0-1.2). Started on levophed and transferred to [facility].    Central access placed and initial coox 31% on 4 of NE. Repeat 43%. Pt also had MMVT and lido + amio added.  Milrinone added with coox improved to 58%  Diuresed 14 lbs and pressors weaned as tolerated. Coox 59% this am off milrinone. Shock felt to be likely due to OOH infarct. LHC initially scheduled, but given no CP, pts age, and Cr of 1.9 deferred for medical management.   Planned for d/c 8/16 >> developed sustained MMVT (slow. 110's)  LVH 8/16 w/occl SVG to Diag felt to be culprit for his  presentation/NSTEMI  Assessment & Plan   # Monomorphic VT New this admission, not previously seen on PPM EF <20% on presentation Likely ischemic. Initiation as above. PPM adjusted LRL to 90 bpm. AV delays have previously been lengthened to try and promote intrinsic conduction; He is mostly dependent now so can likely be moved to a more physiologic delay prior to dc.  (Currently PAV/SAV 350/325) >>> Dr. Graciela Husbands programmed  to 75bpm on 08/28/22  Back on amio AND lidocaine gtts with VT yesterday AM Lido level is pending Ranexa was added   None further on amio/lido gtts and off vasopressin Lido level is drawn > pending Suspect we will continue both today and try to transition to mexiletine tomorrow  prior attempts at CL lead failed Some discussion if EP could conside LB lead to try and improve QRS Pt has declined ICD and confirms that with me today DNR   Keep K+ and mag adequately supplemented     #Acute on chronic CHF (biVe failure) #Cardiogenic Shock #Ischemic cardiomyopathy # NSTEMI  AHF team driving trying to avoid inotrope with his VT Off pressor yesterday Cumulatively neg - Coox 64  Endstage? Would consider palliative/GOC discussion     #AKI Creat at 2.3, 2.35 (baseline is low 1's) #Hyponatremia  Down to 117 > 120 C/w attending team   For questions or updates, please contact [facility] Please consult [email or web address] for contact info under      Signed, Sheilah Pigeon, PA-C  08/29/2022, 8:18 AM    EP Attending  Patient seen and examined. Agree with the findings as noted above. I have reviewed the echo which shows terrible LV dysfunction with an EF of 10-15%. He has a QRS of over 200 ms. Unfortunately his CHF is beyond salvage with no significant chance of improvement, even if biv upgrade could be accomplished. His VT can be treated with amiodarone and mexilitine.  I think it is appropriate to involve paliative care.   Sharlot Gowda Mystie Ormand,MD

## 2022-08-29 NOTE — Progress Notes (Addendum)
Occupational Therapy Treatment Patient Details Name: Grant Stevens MRN: [id number] DOB: 25-Jun-1940 Today's Date: 08/29/2022   History of present illness 82 y.o. male presents to [facility] on 08/20/2022 as a transfer from [facility] with diarrhea and recent chest pain. Pt found to have AKI and NSTEMI. PMH includes OA, afib, bladder CA, BPH, HFrEF, CABG, depression, HTN, bipolar, HLD.   OT comments  Pt progressing towards goals this session, limited by soft BP but stable with SBP 95, RN present/aware. Pt needing mod A for bed mobility and mod A +2 for standing attempts/transfers. Pt needing mod cues for sequencing/hand placement when standing, leans anteriorly. Pt able to complete seated grooming task at sink prior to transfer back to bed. Pt presenting with impairments listed below, will follow acutely. Patient will benefit from continued inpatient follow up therapy, <3 hours/day to maximize safety/ind with ADLs/functional mobility.       If plan is discharge home, recommend the following:  Assist for transportation;Assistance with cooking/housework;A little help with bathing/dressing/bathroom;A little help with walking and/or transfers   Equipment Recommendations  None recommended by OT    Recommendations for Other Services PT consult    Precautions / Restrictions Precautions Precautions: Fall Precaution Comments: watch BP,dizziness Restrictions Weight Bearing Restrictions: No       Mobility Bed Mobility Overal bed mobility: Needs Assistance Bed Mobility: Sit to Supine       Sit to supine: Mod assist   General bed mobility comments: to return BLE's to bed    Transfers Overall transfer level: Needs assistance Equipment used: Rolling walker (2 wheels) Transfers: Sit to/from Stand Sit to Stand: Mod assist, +2 physical assistance                 Balance Overall balance assessment: Needs assistance Sitting-balance support: Feet supported Sitting balance-Leahy  Scale: Good     Standing balance support: Reliant on assistive device for balance, Bilateral upper extremity supported Standing balance-Leahy Scale: Poor Standing balance comment: anterior lean                           ADL either performed or assessed with clinical judgement   ADL Overall ADL's : Needs assistance/impaired     Grooming: Oral care;Sitting;Set up                   Toilet Transfer: Moderate assistance;+2 for physical assistance;Rolling walker (2 wheels);BSC/3in1;Stand-pivot                  Extremity/Trunk Assessment Upper Extremity Assessment Upper Extremity Assessment: Overall WFL for tasks assessed   Lower Extremity Assessment Lower Extremity Assessment: Defer to PT evaluation        Vision   Vision Assessment?: No apparent visual deficits   Perception Perception Perception: Within Functional Limits   Praxis Praxis Praxis: WFL    Cognition Arousal: Alert Behavior During Therapy: Flat affect Overall Cognitive Status: Impaired/Different from baseline Area of Impairment: Safety/judgement, Following commands, Attention                   Current Attention Level: Sustained Memory: Decreased short-term memory Following Commands: Follows one step commands with increased time Safety/Judgement: Decreased awareness of safety, Decreased awareness of deficits     General Comments: pt HOH which contributes to delayed processing        Exercises      Shoulder Instructions       General Comments BP soft, pt reporting  dizziness, systolic BP 95    Pertinent Vitals/ Pain       Pain Assessment Pain Assessment: No/denies pain  Home Living                                          Prior Functioning/Environment              Frequency  Min 1X/week        Progress Toward Goals  OT Goals(current goals can now be found in the care plan section)  Progress towards OT goals: Progressing toward  goals  Acute Rehab OT Goals Patient Stated Goal: none stated OT Goal Formulation: With patient Time For Goal Achievement: 09/06/22 Potential to Achieve Goals: Good ADL Goals Pt Will Perform Grooming: with modified independence;standing Pt Will Perform Lower Body Dressing: with modified independence;sit to/from stand Pt Will Transfer to Toilet: with modified independence;regular height toilet;ambulating Pt/caregiver will Perform Home Exercise Program: Increased strength;Both right and left upper extremity;With theraband;With Supervision  Plan      Co-evaluation                 AM-PAC OT "6 Clicks" Daily Activity     Outcome Measure   Help from another person eating meals?: None Help from another person taking care of personal grooming?: A Little Help from another person toileting, which includes using toliet, bedpan, or urinal?: A Lot Help from another person bathing (including washing, rinsing, drying)?: A Lot Help from another person to put on and taking off regular upper body clothing?: A Little Help from another person to put on and taking off regular lower body clothing?: A Lot 6 Click Score: 16    End of Session Equipment Utilized During Treatment: Gait belt;Rolling walker (2 wheels)  OT Visit Diagnosis: Unsteadiness on feet (R26.81);Muscle weakness (generalized) (M62.81)   Activity Tolerance Patient tolerated treatment well   Patient Left in bed;with call bell/phone within reach;with bed alarm set;with nursing/sitter in room   Nurse Communication Mobility status        Time: 1351-1420 OT Time Calculation (min): 29 min  Charges: OT General Charges $OT Visit: 1 Visit OT Treatments $Self Care/Home Management : 8-22 mins $Therapeutic Activity: 8-22 mins  Grant Fila, OTD, OTR/L SecureChat Preferred Acute Rehab [phone]   Grant Stevens 08/29/2022, 2:48 PM

## 2022-08-29 NOTE — Inpatient Diabetes Management (Signed)
Inpatient Diabetes Program Recommendations  AACE/ADA: New Consensus Statement on Inpatient Glycemic Control (2015)  Target Ranges:  Prepandial:   less than 140 mg/dL      Peak postprandial:   less than 180 mg/dL (1-2 hours)      Critically ill patients:  140 - 180 mg/dL   Lab Results  Component Value Date   GLUCAP 110 (H) 08/24/2022   HGBA1C 6.0 (H) 08/22/2022    Latest Reference Range & Units 08/28/22 17:00 08/29/22 04:42 08/29/22 07:49  Glucose 70 - 99 mg/dL 161 (H) 096 (H) 045 (H)  (H): Data is abnormally high Review of Glycemic Control  Diabetes history: type 2 Outpatient Diabetes medications: Jardiance 10 mg daily Current orders for Inpatient glycemic control: none  Inpatient Diabetes Program Recommendations:   Noted that lab glucose has been 359-368 mg/dl today.   Recommend starting Novolog 0-9 units correction scale TID, Novolog 0-5 units HS scale. CBGs will be checked as well.   Smith Mince RN BSN CDE Diabetes Coordinator Pager: [phone]  8am-5pm

## 2022-08-29 NOTE — Progress Notes (Addendum)
Patient ID: Grant Stevens, male   DOB: [birth year], 82 y.o.   MRN: [id number]     Advanced Heart Failure Rounding Note  PCP-Cardiologist: Nona Dell, MD   Subjective:    A-V paced on tele. No further VT since yesterday morning ~8 am.   Remains on Amio gtt at 60 + Lidocaine gtt at 1.  Lidocaine level pending.   Received Tolvaptan yesterday for hyponatremia. Na 117 on AM labs in setting of hyperglycemia, Gluc 359. Corrected NA 124.   Co-ox 64%. CVP 8-9   Scr 2.12>>2.30 K 3.6  Mg 2.1  Sitting up in bed eating breakfast. Denies CP and dyspnea. Only complaint is poor sleep and constipation.   LHC 8/16: Patent LIMA-LAD and SVG-PDA but occluded SVG-D (new, suspect culprit for NSTEMI).  RA mean 7, LVEDP 16, CI 2.37  Objective:   Weight Range: 94 kg Body mass index is 28.11 kg/m.   Vital Signs:   Temp:  [97.6 F (36.4 C)-98.7 F (37.1 C)] 97.7 F (36.5 C) (08/19 0100) Pulse Rate:  [75] 75 (08/19 0500) Resp:  [10-29] 17 (08/19 0600) BP: (78-120)/(54-97) 112/75 (08/19 0500) SpO2:  [91 %-100 %] 96 % (08/19 0600) Weight:  [94 kg] 94 kg (08/19 0500) Last BM Date : 08/28/22  Weight change: Filed Weights   08/27/22 0500 08/28/22 0500 08/29/22 0500  Weight: 91.5 kg 92.3 kg 94 kg    Intake/Output:   Intake/Output Summary (Last 24 hours) at 08/29/2022 0715 Last data filed at 08/29/2022 0600 Gross per 24 hour  Intake 1768.22 ml  Output 775 ml  Net 993.22 ml      Physical Exam   CVP 8-9  General:  Well appearing, elderly, sitting up in chair. No respiratory difficulty HEENT: normal Neck: supple. JVD 8 cm. Carotids 2+ bilat; no bruits. No lymphadenopathy or thyromegaly appreciated. Cor: PMI nondisplaced. Regular rate & rhythm. No rubs, gallops or murmurs. Lungs: clear Abdomen: soft, nontender, nondistended. No hepatosplenomegaly. No bruits or masses. Good bowel sounds. Extremities: no cyanosis, clubbing, rash, edema Neuro: alert & oriented x 3, cranial nerves  grossly intact. moves all 4 extremities w/o difficulty. Affect pleasant.   Telemetry   A-V paced on tele. No further VT since yesterday morning ~8 am.  (personally reviewed)  Labs    CBC Recent Labs    08/28/22 0455 08/29/22 0442  WBC 12.2* 9.3  NEUTROABS  --  7.9*  HGB 12.1* 10.7*  HCT 35.5* 31.5*  MCV 88.8 89.0  PLT 108* 91*   Basic Metabolic Panel Recent Labs    40/98/11 0455 08/28/22 1700 08/29/22 0442  NA 124* 122* 117*  K 3.8 4.8 3.6  CL 81* 80* 76*  CO2 29 27 27   GLUCOSE 98 124* 359*  BUN 72* 75* 66*  CREATININE 2.12* 2.30* 2.30*  CALCIUM 8.7* 8.7* 7.8*  MG 2.2  --  2.1  PHOS  --   --  3.7   Liver Function Tests Recent Labs    08/28/22 0950 08/29/22 0442  AST 20 19  ALT 23 19  ALKPHOS 84 77  BILITOT 0.4 0.5  PROT 6.2* 5.6*  ALBUMIN 3.1* 2.9*    No results for input(s): "LIPASE", "AMYLASE" in the last 72 hours.  Cardiac Enzymes No results for input(s): "CKTOTAL", "CKMB", "CKMBINDEX", "TROPONINI" in the last 72 hours.  BNP: BNP (last 3 results) Recent Labs    08/21/22 0441  BNP 2,520.5*    ProBNP (last 3 results) No results for input(s): "PROBNP" in the last  8760 hours.   D-Dimer No results for input(s): "DDIMER" in the last 72 hours. Hemoglobin A1C No results for input(s): "HGBA1C" in the last 72 hours.  Fasting Lipid Panel No results for input(s): "CHOL", "HDL", "LDLCALC", "TRIG", "CHOLHDL", "LDLDIRECT" in the last 72 hours.  Thyroid Function Tests No results for input(s): "TSH", "T4TOTAL", "T3FREE", "THYROIDAB" in the last 72 hours.  Invalid input(s): "FREET3"   Other results:   Imaging    No results found.   Medications:     Scheduled Medications:  aspirin EC  81 mg Oral Daily   atorvastatin  80 mg Oral QHS   bisacodyl  10 mg Rectal Once   carbamazepine  200 mg Oral BID   Chlorhexidine Gluconate Cloth  6 each Topical Daily   clopidogrel  75 mg Oral Daily   divalproex  250 mg Oral BID   heparin  5,000 Units  Subcutaneous Q8H   melatonin  3 mg Oral QHS   mirtazapine  15 mg Oral QHS   pantoprazole  40 mg Oral QAC breakfast   polyethylene glycol  17 g Oral BID   potassium chloride  40 mEq Oral Q4H   ranolazine  500 mg Oral BID   senna-docusate  2 tablet Oral BID   sodium chloride flush  10-40 mL Intracatheter Q12H   sodium chloride flush  3 mL Intravenous Q12H   sodium chloride  1 g Oral TID WC   temazepam  15 mg Oral QHS    Infusions:  sodium chloride Stopped (08/20/22 1740)   sodium chloride     sodium chloride     amiodarone 60 mg/hr (08/29/22 0600)   lidocaine 1 mg/min (08/29/22 0600)    PRN Medications: Place/Maintain arterial line **AND** sodium chloride, sodium chloride, acetaminophen, mouth rinse, sodium chloride flush, sodium chloride flush   Assessment/Plan    1. Acute on chronic systolic HF -> cardiogenic shock - In setting of OOH infarct (loss of SVG-D) - Echo 2/24 EF 25-30% RV mildly reduced - Echo 08/21/22: EF < 20% RV severely reduced - Initial co-ox 31%. Started on milrinone and diuresed w/ IV Lasix - Milrinone stopped 8/15.  - Diuresed well. Down total of 14 lb. Switch to po Torsemide 20 mg daily then stopped 8/16. - GDMT limited by soft BP and AKI  - No ? blocker yet w/ marginal output  - Not candidate for advanced therapies with age - CVP  8-9 today.  Will not aggressively diurese with VT runs.  Recheck Na, if < 125 will repeat dose of tolvaptan 15 mg x 1 today for hyponatremia which should give some gentle diuresis.  - Co-ox 64% today with creatinine at 2.30, stable past 24 hr.  Marginal cardiac output but will not start milrinone today given VT runs as worry about arrhythmogenicity.  - Discussed with Dr. Graciela Husbands.  Has very wide paced 250 msec QRS (chronic RV pacing), favor eventual left bundle lead placement (unable to place CS lead in past), will need to discuss with his primary EP physician Dr. Ladona Ridgel today  - Frail with marginal cardiac output and episodes of  VT.  I worry that we may be at an end stage situation.  Left bundle lead may help him, but need to stabilize him from a rhythm standpoint first.   2. CAD with NSTEMI - h/o CAD s/p CABG  - suspect OOH infarct with loss of SVG-D - HS troponin > 13K - had heparin x 48 hours, now [place] heparin.  - DAPT/statin  -  LHC showed patent LIMA-LAD and SVG-RCA, new occlusion SVG-D without interventional option.  - Adding ranolazine 500 mg bid today for anti-arrhythmic and anti-ischemic properties.    3. Ventricular tachycardia - Episode again on 8/16, amiodarone gtt restarted.   - Suspect ischemia-mediated VT, seen by EP.  - Salvos of slow VT in 110s then faster VT in 150s, separated by short sinus bursts.  Discussed with Dr. Graciela Husbands, suspect triggered by ischemia (recent occlusion SVG-D with ischemic penumbra).  - remains on lidocaine and amiodarone gtt.  - Ranolazine added for anti-arrhythmic and anti-ischemic properties.  - Continue lidocaine gtt 1 mg/min this morning,  level pending - Continue amiodarone gtt at 60 mg/hr - Keep K > 4.0 and Mg > 2.0   - Patient clear that he does not want an ICD and is a limited DNR (see below).    4. AKI - due to shock/ATN - baseline Scr 1.1 , peaked at 3.8. 1.82 => 2.12=>2.30 today.  Rise may be related to contrast load with cath on Friday.  - With rising creatinine and marginal CO, will not diurese today besides use of tolvaptan.  CVP 8-9.    5. SSS/CHB - s/p PPM, unable to place CS lead in past.  Has wide paced QRS 250 msec. Chronic RV pacing.  - As above, would like left bundle lead placement after stabilization.   6. Remote history of atrial fibrillation - Has not been anticoagulated - No recurrence this admission. Would need anticoagulation if recurs.  7. Hyponatremia - Na 124 today (corrected for hypoglycemia). Repeat BMP pending.  - Fluid restrict 1200 cc for now.  - CVP 8-9 today, will give dose of tolvaptan 15 mg again if repeat NA < 125.      8.  Limited DNR - Ok with intubation and defibrillation  - Would not want CPR  9. Deconditioning  - will need CIR at d/c  - Try to mobilize today.    Maikayla Beggs, PA-C  7:15 AM

## 2022-08-29 NOTE — Progress Notes (Signed)
Physical Therapy Treatment Patient Details Name: Grant Stevens MRN: [id number] DOB: 1940/09/30 Today's Date: 08/29/2022   History of Present Illness 82 y.o. male presents to [facility] hospital on 08/20/2022 as a transfer from [facility] with diarrhea and recent chest pain. Pt found to have AKI and NSTEMI. PMH includes OA, afib, bladder CA, BPH, HFrEF, CABG, depression, HTN, bipolar, HLD.    PT Comments  Pt received up in chair with c/o "I feel so weak. I feel unsteady. I don't trust myself." Pt requiring modAx1 for transfers and amb with RW with 2nd person providing chair follow and managing lines. Pt very deconditioned but motivated to get stronger. Pt given HEP and was observed doing LE exercises in his chair on his own. Pt to benefit from inpatient rehab program <3 hrs/day. Acute PT to cont to follow.    If plan is discharge home, recommend the following: A little help with walking and/or transfers;A little help with bathing/dressing/bathroom;Assistance with cooking/housework;Assist for transportation;Help with stairs or ramp for entrance   Can travel by private vehicle     Yes  Equipment Recommendations  Rolling walker (2 wheels);BSC/3in1    Recommendations for Other Services       Precautions / Restrictions Precautions Precautions: Fall Precaution Comments: hooked up to zoll, RN to manage Restrictions Weight Bearing Restrictions: No     Mobility  Bed Mobility               General bed mobility comments: in chair and returned to the same    Transfers Overall transfer level: Needs assistance Equipment used: Rolling walker (2 wheels) Transfers: Sit to/from Stand Sit to Stand: Mod assist           General transfer comment: modA to power up and to achieve full upright standing, verbal cues to push up from arm rests, modA to maintain balance during transition of hands up to RW, trunk flexion    Ambulation/Gait Ambulation/Gait assistance: Mod assist, +2  safety/equipment Gait Distance (Feet): 20 Feet (x1, 10x1) Assistive device: Rolling walker (2 wheels) Gait Pattern/deviations: Decreased stride length, Step-through pattern, Trunk flexed, Decreased dorsiflexion - right, Decreased dorsiflexion - left (decreased step height) Gait velocity: reduced Gait velocity interpretation: <1.31 ft/sec, indicative of household ambulator   General Gait Details: pt c/o "I'm so weak. I don't feel stable." PT provided truncal support via gait belt and tactile cues at chest to maintain upright posture and not lean too far forward in RW, verbal cues for slow deep breaths   Stairs             Wheelchair Mobility     Tilt Bed    Modified Rankin (Stroke Patients Only)       Balance Overall balance assessment: Needs assistance Sitting-balance support: Feet supported Sitting balance-Leahy Scale: Good     Standing balance support: Reliant on assistive device for balance, Bilateral upper extremity supported Standing balance-Leahy Scale: Poor Standing balance comment: Requiring RW, swaying back and forth, min-mod A                            Cognition Arousal: Alert (sleepy but alert) Behavior During Therapy: Flat affect Overall Cognitive Status: Impaired/Different from baseline Area of Impairment: Safety/judgement, Following commands, Attention                   Current Attention Level: Sustained Memory: Decreased short-term memory Following Commands: Follows one step commands with increased time Safety/Judgement: Decreased  awareness of safety, Decreased awareness of deficits     General Comments: pt HOH which contributes to delayed processing        Exercises General Exercises - Lower Extremity Ankle Circles/Pumps: AROM, Both, 10 reps, Seated Long Arc Quad: AROM, Both, 10 reps, Seated (v/c's for slow and controlled) Hip Flexion/Marching: AROM, Both, Seated, 10 reps    General Comments General comments (skin  integrity, edema, etc.): VSS on RA      Pertinent Vitals/Pain Pain Assessment Pain Assessment: No/denies pain    Home Living                          Prior Function            PT Goals (current goals can now be found in the care plan section) Acute Rehab PT Goals Patient Stated Goal: to regain strength, return to independence PT Goal Formulation: With patient Time For Goal Achievement: 09/05/22 Potential to Achieve Goals: Good Progress towards PT goals: Progressing toward goals    Frequency    Min 1X/week      PT Plan      Co-evaluation              AM-PAC PT "6 Clicks" Mobility   Outcome Measure  Help needed turning from your back to your side while in a flat bed without using bedrails?: A Lot Help needed moving from lying on your back to sitting on the side of a flat bed without using bedrails?: A Lot Help needed moving to and from a bed to a chair (including a wheelchair)?: A Lot Help needed standing up from a chair using your arms (e.g., wheelchair or bedside chair)?: A Lot Help needed to walk in hospital room?: A Lot Help needed climbing 3-5 steps with a railing? : Total 6 Click Score: 11    End of Session Equipment Utilized During Treatment: Gait belt Activity Tolerance: Patient limited by fatigue (and soft BP) Patient left: in chair;with call bell/phone within reach;with chair alarm set;with nursing/sitter in room Nurse Communication: Mobility status PT Visit Diagnosis: Other abnormalities of gait and mobility (R26.89);Muscle weakness (generalized) (M62.81)     Time: 4098-1191 PT Time Calculation (min) (ACUTE ONLY): 34 min  Charges:    $Gait Training: 8-22 mins $Therapeutic Exercise: 8-22 mins PT General Charges $$ ACUTE PT VISIT: 1 Visit                     Lewis Shock, PT, DPT Acute Rehabilitation Services Secure chat preferred Office #: [phone]    Iona Hansen 08/29/2022, 11:56 AM

## 2022-08-30 ENCOUNTER — Telehealth (INDEPENDENT_AMBULATORY_CARE_PROVIDER_SITE_OTHER): Payer: Self-pay | Admitting: *Deleted

## 2022-08-30 DIAGNOSIS — Z515 Encounter for palliative care: Secondary | ICD-10-CM | POA: Diagnosis not present

## 2022-08-30 DIAGNOSIS — I214 Non-ST elevation (NSTEMI) myocardial infarction: Secondary | ICD-10-CM | POA: Diagnosis not present

## 2022-08-30 DIAGNOSIS — Z7189 Other specified counseling: Secondary | ICD-10-CM

## 2022-08-30 LAB — COOXEMETRY PANEL
Carboxyhemoglobin: 1.8 % — ABNORMAL HIGH (ref 0.5–1.5)
Methemoglobin: <0.7 % (ref 0.0–1.5)
O2 Saturation: 73.1 %
Total hemoglobin: 11.8 g/dL — ABNORMAL LOW (ref 12.0–16.0)

## 2022-08-30 LAB — BASIC METABOLIC PANEL
Anion gap: 15 (ref 5–15)
BUN: 73 mg/dL — ABNORMAL HIGH (ref 8–23)
CO2: 25 mmol/L (ref 22–32)
Calcium: 8.6 mg/dL — ABNORMAL LOW (ref 8.9–10.3)
Chloride: 83 mmol/L — ABNORMAL LOW (ref 98–111)
Creatinine, Ser: 2.56 mg/dL — ABNORMAL HIGH (ref 0.61–1.24)
GFR, Estimated: 24 mL/min — ABNORMAL LOW (ref >60)
Glucose, Bld: 102 mg/dL — ABNORMAL HIGH (ref 70–99)
Potassium: 4.6 mmol/L (ref 3.5–5.1)
Sodium: 123 mmol/L — ABNORMAL LOW (ref 135–145)

## 2022-08-30 LAB — SODIUM, URINE, RANDOM: Sodium, Ur: <10 mmol/L

## 2022-08-30 LAB — CBC
HCT: 33.7 % — ABNORMAL LOW (ref 39.0–52.0)
Hemoglobin: 11.4 g/dL — ABNORMAL LOW (ref 13.0–17.0)
MCH: 30.2 pg (ref 26.0–34.0)
MCHC: 33.8 g/dL (ref 30.0–36.0)
MCV: 89.4 fL (ref 80.0–100.0)
Platelets: 85 10*3/uL — ABNORMAL LOW (ref 150–400)
RBC: 3.77 MIL/uL — ABNORMAL LOW (ref 4.22–5.81)
RDW: 13.4 % (ref 11.5–15.5)
WBC: 12.7 10*3/uL — ABNORMAL HIGH (ref 4.0–10.5)
nRBC: 0 % (ref 0.0–0.2)

## 2022-08-30 LAB — MAGNESIUM: Magnesium: 2.1 mg/dL (ref 1.7–2.4)

## 2022-08-30 LAB — OSMOLALITY, URINE: Osmolality, Ur: 426 mosm/kg (ref 300–900)

## 2022-08-30 LAB — GLUCOSE, CAPILLARY
Glucose-Capillary: 100 mg/dL — ABNORMAL HIGH (ref 70–99)
Glucose-Capillary: 114 mg/dL — ABNORMAL HIGH (ref 70–99)

## 2022-08-30 LAB — LIDOCAINE LEVEL: Lidocaine Lvl: 1.6 ug/mL (ref 1.5–5.0)

## 2022-08-30 MED ORDER — GLYCOPYRROLATE 1 MG PO TABS: 1.0000 mg | ORAL_TABLET | ORAL | Status: DC | PRN

## 2022-08-30 MED ORDER — GLYCOPYRROLATE 0.2 MG/ML IJ SOLN: 0.2000 mg | INTRAMUSCULAR | Status: DC | PRN

## 2022-08-30 MED ORDER — HYDROMORPHONE HCL 1 MG/ML IJ SOLN
0.5000 mg | INTRAMUSCULAR | Status: DC | PRN
  Administered 2022-09-01 – 2022-09-02 (×2): 0.5 mg via INTRAVENOUS
  Filled 2022-08-30 (×2): qty 0.5

## 2022-08-30 MED ORDER — ONDANSETRON 4 MG PO TBDP: 4.0000 mg | ORAL_TABLET | Freq: Four times a day (QID) | ORAL | Status: DC | PRN

## 2022-08-30 MED ORDER — POLYVINYL ALCOHOL 1.4 % OP SOLN: 1.0000 [drp] | Freq: Four times a day (QID) | OPHTHALMIC | Status: DC | PRN

## 2022-08-30 MED ORDER — LORAZEPAM 1 MG PO TABS
1.0000 mg | ORAL_TABLET | ORAL | Status: DC | PRN
  Administered 2022-09-01 – 2022-09-02 (×2): 1 mg via ORAL
  Filled 2022-08-30 (×5): qty 1

## 2022-08-30 MED ORDER — HYDROXYZINE HCL 10 MG/5ML PO SYRP
10.0000 mg | ORAL_SOLUTION | Freq: Three times a day (TID) | ORAL | Status: DC | PRN
  Administered 2022-08-30: 10 mg via ORAL
  Filled 2022-08-30 (×2): qty 5

## 2022-08-30 MED ORDER — LORAZEPAM 2 MG/ML PO CONC: 1.0000 mg | ORAL | Status: DC | PRN

## 2022-08-30 MED ORDER — MORPHINE SULFATE (CONCENTRATE) 10 MG/0.5ML PO SOLN: 5.0000 mg | ORAL | Status: DC | PRN

## 2022-08-30 MED ORDER — MORPHINE SULFATE (CONCENTRATE) 10 MG/0.5ML PO SOLN
5.0000 mg | ORAL | Status: DC | PRN
  Administered 2022-09-02: 5 mg via ORAL
  Filled 2022-08-30: qty 0.5

## 2022-08-30 MED ORDER — ONDANSETRON HCL 4 MG/2ML IJ SOLN
4.0000 mg | Freq: Four times a day (QID) | INTRAMUSCULAR | Status: DC | PRN
  Administered 2022-08-30: 4 mg via INTRAVENOUS
  Filled 2022-08-30: qty 2

## 2022-08-30 MED ORDER — LORAZEPAM 1 MG PO TABS
1.0000 mg | ORAL_TABLET | ORAL | Status: DC | PRN
  Administered 2022-08-31 – 2022-09-02 (×3): 1 mg via ORAL

## 2022-08-30 MED ORDER — BIOTENE DRY MOUTH MT LIQD: 15.0000 mL | OROMUCOSAL | Status: DC | PRN

## 2022-08-30 NOTE — Progress Notes (Signed)
Patient ID: Grant Stevens, male   DOB: 05-Sep-1940, 82 y.o.   MRN: [id number] Patient arrived to floor. VSS. Resting quietly at this time, call bell within reach.  Lidia Collum, RN

## 2022-08-30 NOTE — Progress Notes (Signed)
Patient ID: Grant Stevens, male   DOB: [birth year], 82 y.o.   MRN: [id number]     Advanced Heart Failure Rounding Note  PCP-Cardiologist: Nona Dell, MD   Subjective:    A-V paced on tele. Remains on amio gtt at 60. Off lidocaine gtt w/ elevated level. No further VT since 8/18 morning.  K 4.6  Mg 2.1  Co-ox 73%.  Scr 2.12>>2.30>>2.56. Nonoliguric, 900 cc in UOP yesterday. CVP 10-11 today.   Na 123, but mentation ok.   Denies dyspnea. O2 sats 99% on RA. Denies CP. Appetite is good but not sleeping well. Open to Hospice Care discussion.    LHC 8/16: Patent LIMA-LAD and SVG-PDA but occluded SVG-D (new, suspect culprit for NSTEMI).  RA mean 7, LVEDP 16, CI 2.37  Objective:   Weight Range: 94.1 kg Body mass index is 28.14 kg/m.   Vital Signs:   Temp:  [97.6 F (36.4 C)-98.5 F (36.9 C)] 98.1 F (36.7 C) (08/20 0750) Pulse Rate:  [75-82] 75 (08/20 0400) Resp:  [13-28] 15 (08/20 0700) BP: (89-132)/(62-93) 108/71 (08/20 0700) SpO2:  [80 %-100 %] 98 % (08/20 0700) Weight:  [94.1 kg] 94.1 kg (08/20 0500) Last BM Date : 08/29/22  Weight change: Filed Weights   08/28/22 0500 08/29/22 0500 08/30/22 0500  Weight: 92.3 kg 94 kg 94.1 kg    Intake/Output:   Intake/Output Summary (Last 24 hours) at 08/30/2022 0828 Last data filed at 08/30/2022 0600 Gross per 24 hour  Intake 1126.95 ml  Output 900 ml  Net 226.95 ml      Physical Exam   CVP 10-11  General:  Well appearing elderly male, sitting up in chair. No respiratory difficulty HEENT: normal Neck: supple. JVD 10 cm. Carotids 2+ bilat; no bruits. No lymphadenopathy or thyromegaly appreciated. Cor: PMI nondisplaced. Regular rate & rhythm. No rubs, gallops or murmurs. Lungs: decreased BS at the bases  Abdomen: soft, nontender, nondistended. No hepatosplenomegaly. No bruits or masses. Good bowel sounds. Extremities: no cyanosis, clubbing, rash, edema Neuro: alert & oriented x 3, cranial nerves grossly intact. moves  all 4 extremities w/o difficulty. Affect pleasant.   Telemetry   A-V paced on tele. No further VT since 8/18 ~8 am.  (personally reviewed)  Labs    CBC Recent Labs    08/29/22 0442 08/29/22 0749 08/30/22 0311  WBC 9.3 10.4 12.7*  NEUTROABS 7.9*  --   --   HGB 10.7* 11.6* 11.4*  HCT 31.5* 34.1* 33.7*  MCV 89.0 88.6 89.4  PLT 91* 95* 85*   Basic Metabolic Panel Recent Labs    78/29/56 0442 08/29/22 0749 08/29/22 1002 08/29/22 2123 08/30/22 0311  NA 117*   < > 121* 117* 123*  K 3.6   < > 3.8  --  4.6  CL 76*   < > 81*  --  83*  CO2 27   < > 28  --  25  GLUCOSE 359*   < > 97  --  102*  BUN 66*   < > 70*  --  73*  CREATININE 2.30*   < > 2.34*  --  2.56*  CALCIUM 7.8*   < > 8.6*  --  8.6*  MG 2.1  --   --   --  2.1  PHOS 3.7  --   --   --   --    < > = values in this interval not displayed.   Liver Function Tests Recent Labs    08/28/22 0950  08/29/22 0442  AST 20 19  ALT 23 19  ALKPHOS 84 77  BILITOT 0.4 0.5  PROT 6.2* 5.6*  ALBUMIN 3.1* 2.9*    No results for input(s): "LIPASE", "AMYLASE" in the last 72 hours.  Cardiac Enzymes No results for input(s): "CKTOTAL", "CKMB", "CKMBINDEX", "TROPONINI" in the last 72 hours.  BNP: BNP (last 3 results) Recent Labs    08/21/22 0441  BNP 2,520.5*    ProBNP (last 3 results) No results for input(s): "PROBNP" in the last 8760 hours.   D-Dimer No results for input(s): "DDIMER" in the last 72 hours. Hemoglobin A1C No results for input(s): "HGBA1C" in the last 72 hours.  Fasting Lipid Panel No results for input(s): "CHOL", "HDL", "LDLCALC", "TRIG", "CHOLHDL", "LDLDIRECT" in the last 72 hours.  Thyroid Function Tests Recent Labs    08/29/22 0439  TSH 6.222*     Other results:   Imaging    No results found.   Medications:     Scheduled Medications:  aspirin EC  81 mg Oral Daily   atorvastatin  80 mg Oral QHS   bisacodyl  10 mg Rectal Once   carbamazepine  200 mg Oral BID   Chlorhexidine  Gluconate Cloth  6 each Topical Daily   clopidogrel  75 mg Oral Daily   divalproex  250 mg Oral BID   heparin  5,000 Units Subcutaneous Q8H   melatonin  5 mg Oral QHS   mirtazapine  15 mg Oral QHS   pantoprazole  40 mg Oral QAC breakfast   polyethylene glycol  17 g Oral BID   ranolazine  500 mg Oral BID   senna-docusate  2 tablet Oral BID   sodium chloride flush  10-40 mL Intracatheter Q12H   sodium chloride flush  3 mL Intravenous Q12H   temazepam  30 mg Oral QHS    Infusions:  sodium chloride Stopped (08/20/22 1740)   sodium chloride     sodium chloride     amiodarone 60 mg/hr (08/30/22 0600)    PRN Medications: Place/Maintain arterial line **AND** sodium chloride, sodium chloride, acetaminophen, hydrOXYzine, mouth rinse, sodium chloride flush, sodium chloride flush   Assessment/Plan    1. Acute on chronic systolic HF -> cardiogenic shock - In setting of OOH infarct (loss of SVG-D) - Echo 2/24 EF 25-30% RV mildly reduced - Echo 08/21/22: EF < 20% RV severely reduced - Initial co-ox 31%. Started on milrinone and diuresed w/ IV Lasix - Milrinone stopped 8/15.  Co-ox stable 73%  - CVP 10-11 today (severe RV failure). SCr cont to rise. He is resting comfortably w/ stable O2 sats on RA. Can use torsemide/IV Lasix PRN for comfort   - GDMT limited by soft BP and AKI  - No ? blocker yet w/ recent shock   - Not candidate for advanced therapies with age - D/w EP. He has very wide paced 250 msec QRS (chronic RV pacing) but doubt left bundle lead placement would help his overall situation. Palliative/ Hospice care recommended.   2. CAD with NSTEMI - h/o CAD s/p CABG  - suspect OOH infarct with loss of SVG-D - HS troponin > 13K - had heparin x 48 hours, now [place] heparin.  - DAPT/statin  - LHC showed patent LIMA-LAD and SVG-RCA, new occlusion SVG-D without interventional option.  - Continue ranolazine 500 mg bid for anti-arrhythmic and anti-ischemic properties.    3. Ventricular  tachycardia - Episode again on 8/16, amiodarone gtt restarted.   - Suspect ischemia-mediated VT,  seen by EP.  - Salvos of slow VT in 110s then faster VT in 150s, separated by short sinus bursts.  Discussed with Dr. Graciela Husbands, suspect triggered by ischemia (recent occlusion SVG-D with ischemic penumbra).  - remains on lidocaine and amiodarone gtt.  - Ranolazine added for anti-arrhythmic and anti-ischemic properties.  - VT quiescent since 8/18. Remains on amio gtt at 60/hr. Lidocaine gtt discontinued 8/19 due to elevated level. Repeat pending.   - d/w EP. Plan to transition back to PO amio. Can add Mexiletine as adjunct later today vs tomorrow pending repeat lido level  - not candidate for ICD upgrade given age and co morbidities. Now DNR  - Keep K > 4.0 and Mg > 2.0      4. AKI - due to shock/ATN. Rise may be related to contrast load with cath on Friday.  - baseline Scr 1.1 , peaked at 3.8. 1.82 => 2.12=>2.30=>2.56 today.    - volume status ok. Diuretics PRN   5. SSS/CHB - s/p PPM, unable to place CS lead in past.  Has wide paced QRS 250 msec. Chronic RV pacing.  - d/w EP, no plans to upgrade device   6. Remote history of atrial fibrillation - Has not been anticoagulated - No recurrence this admission. Would need anticoagulation if recurs.  7. Hyponatremia - Na 123 today, in setting of severe RV failure  - can ease fluid restriction limits if transitioning to comfort/hospice care    8. GOC - d/w pt today. He would like palliative care team consult to discuss hospice options.   Plan d/c once hospice vs outpatient palliative care arrangements are made. Will ask for PC team to see today. Pt would like his sister to be present for conversation.    Robbie Lis, PA-C  8:28 AM

## 2022-08-30 NOTE — Consult Note (Signed)
Palliative Medicine Inpatient Consult Note  Consulting Provider:  Zannie Cove, MD   Reason for consult:   Palliative Care Consult Services Palliative Medicine Consult  Reason for Consult? goals of care   08/30/2022  HPI:  Per intake H&P --> 82 year old male with PMH of CAD, s/p CABG, chronic systolic CHF, s/p PPM. The Palliative care team has been asked to get involved in the setting of Grant Stevens's end stage heart failure for further goals of care conversations.   Clinical Assessment/Goals of Care:  *Please note that this is a verbal dictation therefore any spelling or grammatical errors are due to the "Dragon [employer] One" system interpretation.  I have reviewed medical records including EPIC notes, labs and imaging, received report from bedside RN, assessed the patient who is lying in bed in NAD.    I met with Grant Stevens and his niece, [name] to further discuss diagnosis prognosis, GOC, EOL wishes, disposition and options.   I introduced Palliative Medicine as specialized medical care for people living with serious illness. It focuses on providing relief from the symptoms and stress of a serious illness. The goal is to improve quality of life for both the patient and the family.  Medical History Review and Understanding:  Discussed patient's past medical history significant for coronary artery disease heart failure, NSTEMI, kidney disease, and bipolar disorder  Social History:  Dom shares that he is from [facility].  He is a widower as his wife died 10 years ago though he has had a girlfriend for a number of years now.  He formally worked as a Management consultant.  Is that he is a man of faith and was saved in the 68s.  He has a green thumb and used to love working his vegetable garden, he also had B's, and took great pride in his [place]  Functional and Nutritional State:  Berge was living independently preceding admission able to do all B ADLs and IADLs  independently.  Had a reasonably decent appetite.  Advance Directives:  A detailed discussion was had today regarding advanced directives.  Grant Stevens would prefer to his niece and nephew should additional decisions need to be made.  Code Status:  Concepts specific to code status, artifical feeding and hydration, continued IV antibiotics and rehospitalization was had.  The difference between a aggressive medical intervention path  and a palliative comfort care path for this patient at this time was had.   Jmarion is an established DO NOT RESUSCITATE DO NOT INTUBATE CODE STATUS.  Discussion:  Open and honest conversations held in the setting of [place] clinical disease burden.  Reviewed his prolonged hospitalization and the status of his heart failure, coronary artery disease with possible NSTEMI, his ventricular tachycardia episodes, and hyponatremia.  We reviewed overall the concern that Grant Stevens time on earth will be quite limited.  [name] shares that he understands this time will be short and we discussed options moving forward to keep him comfortable and alleviate pain and suffering.  Discussed comfort care in the hospital and transition to hospice. We talked about transition to comfort measures in house and what that would entail inclusive of medications to control pain, dyspnea, agitation, nausea, itching, and hiccups.  We discussed stopping all uneccessary measures such as cardiac monitoring, blood draws, needle sticks, and frequent vital signs. Utilized reflective listening throughout our time together.   Both [name] and his niece would like him to transition to hospice of [facility] inpatient hospice.  I shared a referral will  be made by the case management team.  Discussed the importance of continued conversation with family and their  medical providers regarding overall plan of care and treatment options, ensuring decisions are within the context of the patients values and  GOCs.  Decision Maker: Grant Stevens (Niece): [phone] (Mobile)   SUMMARY OF RECOMMENDATIONS   DNAR/DNI  Comfort Care  Appreciate TOC referral to [facility]  Ongoing PMT support  Code Status/Advance Care Planning: DNAR/DNI  Palliative Prophylaxis:  Aspiration, Bowel Regimen, Delirium Protocol, Frequent Pain Assessment, Oral Care, Palliative Wound Care, and Turn Reposition  Additional Recommendations (Limitations, Scope, Preferences): Continue current care  Psycho-social/Spiritual:  Desire for further Chaplaincy support: Yes Additional Recommendations: Education on end stage disease   Prognosis: Limited days - weeks  Discharge Planning: Discharge to hospice when a bed is available  Vitals:   08/30/22 1000 08/30/22 1010  BP:  100/65  Pulse:    Resp: (!) 27 15  Temp:    SpO2:  91%    Intake/Output Summary (Last 24 hours) at 08/30/2022 1059 Last data filed at 08/30/2022 1000 Gross per 24 hour  Intake 1212 ml  Output 900 ml  Net 312 ml   Last Weight  Most recent update: 08/30/2022  6:08 AM    Weight  94.1 kg (207 lb 7.3 oz)            Gen:  Elderly  HEENT: moist mucous membranes CV: Irregular rate and rhythm  PULM: On RA, breathing is even and not labored  ABD: soft/nontender  EXT: LE edema  Neuro: Alert and oriented x3   PPS: 30%   This conversation/these recommendations were discussed with patient primary care team, Dr. [name]  Billing based on MDM: High  Problems Addressed: One acute or chronic illness or injury that poses a threat to life or bodily function  Amount and/or Complexity of Data: Category 3:Discussion of management or test interpretation with external physician/other qualified health care professional/appropriate source (not separately reported)  Risks: Decision not to resuscitate or to de-escalate care because of poor prognosis ______________________________________________________ [name]  Palliative Medicine Team Team Cell Phone: [phone] Please utilize secure chat with additional questions, if there is no response within 30 minutes please call the above phone number  Palliative Medicine Team providers are available by phone from 7am to 7pm daily and can be reached through the team cell phone.  Should this patient require assistance outside of these hours, please call the patient's attending physician.

## 2022-08-30 NOTE — Progress Notes (Signed)
PROGRESS NOTE   Grant Stevens  MWU:[id number]    DOB: [birth year]    DOA: 08/20/2022  PCP: Carylon Perches, MD   Brief Narrative:  82 year old male with PMH of CAD, s/p CABG, chronic systolic CHF, s/p PPM, presented to ED on 08/20/2022 with complaints of diarrhea for 24 hours.  Also had intermittent chest pain with associated dyspnea and diaphoresis.  Hypotensive in the ED to 87/75 and hypoxic with oxygen saturation of 82%.  Admitted for acute on chronic systolic heart failure with cardiogenic shock, CAD with possible NSTEMI complicated by acute kidney injury.  Cardiology and nephrology consulting.  Treated with  IV milrinone drip and IV Lasix. -Volume status improved, milrinone discontinued 8/15 -Discharge planned 8/16 -Subsequently had prolonged monomorphic V. tach, discharge canceled, left heart cath noted occluded SVG to diagonal, medical management recommended, started on Amio gtt., EP consulted -8/18 with recurrent VT: EP following, bolused with lidocaine, currently on lidocaine drip along with Amio gtt. -Ongoing hyponatremia -Prognosis poor-Palliative consulted 8/19  Subjective:  -Feels okay, denies dyspnea this morning,  Assessment & Plan:   Acute on chronic systolic CHF with cardiogenic shock Advanced heart failure team following -TTE 8/11: LVEF <20% with severely reduced RV -Diuresed with IV Lasix and milrinone, now off milrinone -10 L since admission and weight down by about 15 pounds. -Off GDMT due to shock and acute kidney injury.  - Not a candidate for advanced therapies per cardiology. -Initially plans to defer cath, subsequently had sustained V. tach, LHC 8/16 noted patent LIMA to LAD, SVG to PDA but occluded SVG to diagonal, normal filling pressures, medical management recommended -Poor prognosis, now full DNR, discussed hospice with patient, will attempt to reach sister today, left voicemail yesterday, palliative consulted  CAD with possible NSTEMI History of CAD s/p  CABG Completed heparin x 48 hours. Continue DAPT with aspirin and Plavix. LHC showed patent LIMA-LAD and SVG-RCA, occluded SVG-diagonal.,  Medical management recommended -Ranexa added 8/18  Ventricular tachycardia -Recurrent episode 8/16, 10 minutes of sustained V. Tach -Amio gtt. Restarted -8/18 recurrent VT, amiodarone drip increased to 60 Mg per hour, bolused with lidocaine and started on lidocaine gtt., now off -Potassium low, repleted -Now full DNR -Poor prognosis, palliative consulted  Hyperglycemia -Resolved, A1c is 6.0, this was due to incorrect lab draw  Acute kidney injury due to cardiogenic shock and ATN Baseline serum creatinine 1.1.  Creatinine peaked to 3.8, trended down to 1.8, now back up to 2.3  Hyperkalemia: Secondary to AKI.  Resolved.  Chronic right knee pain: Suspect osteoarthritis.  No history of trauma or gout. As needed Tylenol and topical Voltaren gel.  Hyponatremia Multifactorial due to decompensated CHF, diuretics, AKI etc. -received 2 doses of tolvaptan in the last few days,  -?  Low output -Defer repeat tolvaptan to cards  Sick sinus syndrome S/p pacemaker with paced rhythm on telemetry at bedside  Remote history of A-fib Not on anticoagulation.  Per cardiology if recurs, would need anticoagulation.  GERD: PPI.  Anemia and thrombocytopenia: Stable.  Insomnia: -Remeron  Nausea and vomiting: -Improved with supportive care, -On laxatives for constipation   Body mass index is 28.14 kg/m.   DVT prophylaxis: Hep SQ   Code Status: DNR: Full DNR as of 8/19 Family Communication: None at bedside, unable to reach close friend Huntley Dec, left message for sister yesterday, will try again Disposition: Anticipate need for hospice     Consultants:   Cardiology/advanced heart failure cardiologist Nephrology  Procedures:     Antimicrobials:  Objective:   Vitals:   08/30/22 0700 08/30/22 0750 08/30/22 0800 08/30/22 0900  BP: 108/71      Pulse:      Resp: 15 (!) 25 15 (!) 22  Temp:  98.1 F (36.7 C)    TempSrc:  Oral    SpO2: 98% 96% 100% 99%  Weight:      Height:        Weak chronically ill male sitting up in bed, AAOx3 HEENT: No JVD CVS: S1-S2, regular rhythm Lungs: Decreased breath sounds at the bases Abdomen: Soft, nontender, bowel sounds present Extremities: Trace edema  Skin: no new rashes on exposed skin     Data Reviewed:   I have personally reviewed following labs and imaging studies   CBC: Recent Labs  Lab 08/29/22 0442 08/29/22 0749 08/30/22 0311  WBC 9.3 10.4 12.7*  NEUTROABS 7.9*  --   --   HGB 10.7* 11.6* 11.4*  HCT 31.5* 34.1* 33.7*  MCV 89.0 88.6 89.4  PLT 91* 95* 85*    Basic Metabolic Panel: Recent Labs  Lab 08/26/22 0428 08/26/22 1534 08/27/22 0042 08/27/22 0936 08/28/22 0455 08/28/22 1700 08/29/22 0442 08/29/22 0749 08/29/22 1002 08/29/22 2123 08/30/22 0311  NA 125*   < > 121*   < > 124* 122* 117* 120* 121* 117* 123*  K 3.8   < > 4.4   < > 3.8 4.8 3.6 3.6 3.8  --  4.6  CL 81*  --  80*   < > 81* 80* 76* 77* 81*  --  83*  CO2 30  --  27   < > 29 27 27 26 28   --  25  GLUCOSE 87  --  86   < > 98 124* 359* 368* 97  --  102*  BUN 68*  --  72*   < > 72* 75* 66* 66* 70*  --  73*  CREATININE 1.97*   < > 1.82*   < > 2.12* 2.30* 2.30* 2.35* 2.34*  --  2.56*  CALCIUM 8.6*  --  8.5*   < > 8.7* 8.7* 7.8* 8.2* 8.6*  --  8.6*  MG 1.9  --  2.4  --  2.2  --  2.1  --   --   --  2.1  PHOS  --   --   --   --   --   --  3.7  --   --   --   --    < > = values in this interval not displayed.    Liver Function Tests: Recent Labs  Lab 08/24/22 2151 08/27/22 0936 08/28/22 0950 08/29/22 0442  AST 22 22 20 19   ALT 38 29 23 19   ALKPHOS 83 96 84 77  BILITOT 0.5 0.7 0.4 0.5  PROT 6.6 6.9 6.2* 5.6*  ALBUMIN 3.6 3.6 3.1* 2.9*    CBG: Recent Labs  Lab 08/29/22 1559 08/29/22 2141 08/30/22 0611  GLUCAP 106* 120* 100*    Microbiology Studies:   Recent Results (from the past  240 hour(s))  Culture, blood (routine x 2)     Status: None   Collection Time: 08/20/22 12:24 PM   Specimen: Right Antecubital; Blood  Result Value Ref Range Status   Specimen Description   Final    RIGHT ANTECUBITAL BOTTLES DRAWN AEROBIC AND ANAEROBIC   Special Requests Blood Culture adequate volume  Final   Culture   Final    NO GROWTH 5 DAYS Performed at [facility]  [facility], [address] [place], [place] [address]    Report Status 08/25/2022 FINAL  Final  Culture, blood (routine x 2)     Status: None   Collection Time: 08/20/22 12:24 PM   Specimen: Left Antecubital; Blood  Result Value Ref Range Status   Specimen Description   Final    LEFT ANTECUBITAL BOTTLES DRAWN AEROBIC AND ANAEROBIC   Special Requests Blood Culture adequate volume  Final   Culture   Final    NO GROWTH 5 DAYS Performed at [facility], [address], [place], [place] [address]    Report Status 08/25/2022 FINAL  Final  MRSA Next Gen by PCR, Nasal     Status: None   Collection Time: 08/20/22  4:01 PM   Specimen: Nasal Mucosa; Nasal Swab  Result Value Ref Range Status   MRSA by PCR Next Gen NOT DETECTED NOT DETECTED Final    Comment: (NOTE) The GeneXpert MRSA Assay (FDA approved for NASAL specimens only), is one component of a comprehensive MRSA colonization surveillance program. It is not intended to diagnose MRSA infection nor to guide or monitor treatment for MRSA infections. Test performance is not FDA approved in patients less than 75 years old. Performed at [facility], [address], [place], [place] [address]   Respiratory (~20 pathogens) panel by PCR     Status: None   Collection Time: 08/22/22  7:38 AM   Specimen: Nasopharyngeal Swab; Respiratory  Result Value Ref Range Status   Adenovirus NOT DETECTED NOT DETECTED Final   Coronavirus 229E NOT DETECTED NOT DETECTED Final    Comment: (NOTE) The Coronavirus on the Respiratory Panel, DOES NOT test for the novel  Coronavirus (2019 nCoV)     Coronavirus HKU1 NOT DETECTED NOT DETECTED Final   Coronavirus NL63 NOT DETECTED NOT DETECTED Final   Coronavirus OC43 NOT DETECTED NOT DETECTED Final   Metapneumovirus NOT DETECTED NOT DETECTED Final   Rhinovirus / Enterovirus NOT DETECTED NOT DETECTED Final   Influenza A NOT DETECTED NOT DETECTED Final   Influenza B NOT DETECTED NOT DETECTED Final   Parainfluenza Virus 1 NOT DETECTED NOT DETECTED Final   Parainfluenza Virus 2 NOT DETECTED NOT DETECTED Final   Parainfluenza Virus 3 NOT DETECTED NOT DETECTED Final   Parainfluenza Virus 4 NOT DETECTED NOT DETECTED Final   Respiratory Syncytial Virus NOT DETECTED NOT DETECTED Final   Bordetella pertussis NOT DETECTED NOT DETECTED Final   Bordetella Parapertussis NOT DETECTED NOT DETECTED Final   Chlamydophila pneumoniae NOT DETECTED NOT DETECTED Final   Mycoplasma pneumoniae NOT DETECTED NOT DETECTED Final    Comment: Performed at [facility], [address] [place], [place] [address]  SARS Coronavirus 2 by RT PCR (hospital order, performed in [facility] hospital lab) *cepheid single result test* Anterior Nasal Swab     Status: None   Collection Time: 08/22/22  7:38 AM   Specimen: Anterior Nasal Swab  Result Value Ref Range Status   SARS Coronavirus 2 by RT PCR NEGATIVE NEGATIVE Final    Comment: Performed at [facility], [address], [place], [place] [address]    Radiology Studies:  DG CHEST PORT 1 VIEW  Result Date: 08/29/2022 CLINICAL DATA:  Shortness of breath. EXAM: PORTABLE CHEST 1 VIEW COMPARISON:  August 27, 2022. FINDINGS: The heart size and mediastinal contours are within normal limits. Status post coronary artery bypass graft. Left-sided pacemaker is unchanged in position. Minimal bibasilar subsegmental atelectasis is noted. The visualized skeletal  structures are unremarkable. IMPRESSION: Minimal bibasilar subsegmental atelectasis. Electronically Signed   By: Lupita Raider M.D.   On: 08/29/2022 10:15     Scheduled Meds:    aspirin EC  81 mg Oral Daily   atorvastatin  80 mg Oral QHS   bisacodyl  10 mg Rectal Once   carbamazepine  200 mg Oral BID   Chlorhexidine Gluconate Cloth  6 each Topical Daily   clopidogrel  75 mg Oral Daily   divalproex  250 mg Oral BID   heparin  5,000 Units Subcutaneous Q8H   melatonin  5 mg Oral QHS   mirtazapine  15 mg Oral QHS   pantoprazole  40 mg Oral QAC breakfast   polyethylene glycol  17 g Oral BID   ranolazine  500 mg Oral BID   senna-docusate  2 tablet Oral BID   sodium chloride flush  10-40 mL Intracatheter Q12H   sodium chloride flush  3 mL Intravenous Q12H   temazepam  30 mg Oral QHS    Continuous Infusions:    sodium chloride Stopped (08/20/22 1740)   sodium chloride     sodium chloride     amiodarone 60 mg/hr (08/30/22 0600)     LOS: 10 days     Zannie Cove, MD,   [facility]  08/30/2022, 9:36 AM

## 2022-08-30 NOTE — Progress Notes (Addendum)
Case discussed with Dr. Ladona Ridgel, tele reviewed No VT AV pacing No device options for him Recommend palliative/GOC discussions  F/u lidocaine level is pending >> if not elevated start mexiletine 150-200mg  TID Transition to PO amiodarone when felt reasonable from AHF team perspective.  EP service will sign off though remain available Please recall if needed  EP Attending  Agree with above.   Sharlot Gowda Ivor Kishi,MD

## 2022-08-30 NOTE — TOC Progression Note (Signed)
Transition of Care [facility]) - Progression Note    Patient Details  Name: Grant Stevens MRN: [id number] Date of Birth: July 27, 1940  [facility]) CM/SW Contact  Nicanor Bake Phone Number: [phone] 08/30/2022, 12:37 PM  Clinical Narrative:  CSW met with the pt at bedside. CSW gave the pt a list of hospice options. Pt stated that he and his family selected [facility]) as the choice. CSW called to confirm that a bed was available. [name] stated that they do have beds available at this time. CSW called and spoke with pts niece, [name] who stated that she is not his POA. However, she has not been able to make contact with his CPA. CSW asked the pt who his CPA was and if that was his POA. Pt stated that his CPA is [name] [phone]. CSW called [facility] and left a VM with her receptionist asking to be called back. TOC will continue following.     Expected Discharge Plan: Skilled Nursing Facility Barriers to Discharge: Continued Medical Work up  Expected Discharge Plan and Services In-house Referral: Clinical Social Work Discharge Planning Services: CM Consult Post Acute Care Choice: Skilled Nursing Facility Living arrangements for the past 2 months: Single Family Home Expected Discharge Date: 08/26/22                                     Social Determinants of Health (SDOH) Interventions SDOH Screenings   Food Insecurity: No Food Insecurity (08/24/2022)  Housing: Low Risk  (08/24/2022)  Transportation Needs: No Transportation Needs (08/24/2022)  Utilities: Not At Risk (08/24/2022)  Tobacco Use: Medium Risk (08/20/2022)    Readmission Risk Interventions     No data to display

## 2022-08-30 NOTE — Telephone Encounter (Signed)
Referring MD/PCP: [employer]: Francine Graven [id number]  Best Phone Number: [phone]  Reason for the colonoscopy screening  Has patient had this procedure before?  Yes, 10 yrs ago  If so, when, by whom and where?    Is there a family history of colon cancer?  no  Who?  What age when diagnosed?    Is patient diabetic? If yes, Type 1 or Type 2   no      Does patient have prosthetic heart valve or mechanical valve?  no  Do you have a pacemaker/defibrillator?  yes  Has patient ever had endocarditis/atrial fibrillation? no  Has patient had joint replacement within last 12 months?  no  Is patient constipated or do they take laxatives? no  Does patient have a history of alcohol/drug use?  no  Does patient use oxygen? no  Have you had a stroke/heart attack last 6 mths? no  Do you take medicine for weight loss?  no  For male patients,: have you had a hysterectomy                       are you post menopausal                       do you still have your menstrual cycle   Do you take any blood-thinning medications such as: (aspirin, warfarin, Plavix, Aggrenox)  yes  If yes we need the name, milligram, dosage and who is prescribing doctor asa 81 mg daily  Medications: asa 81 mg daily, atorvastatin 80 mg daily, bisoprolol 5 mg 1/2 tab daily, carbamazepine 200 mg twice daily, divalproex 250 mg twice daily, empagliflozin 10 mg daily, entresto 24-26 mg twice daily, epi-pen as needed, famotidine 40 mg daily, furosemide 40 mg 0.5-1 tab daily, 20 mg one day then 40 mg the other day alternating, pantoprazole 40 mg daily, silodosin 8 mg daily, super B complex daily, temazepam 15 mg nightly,   Allergies: spironolactone  Pharmacy: [facility]

## 2022-08-30 NOTE — Progress Notes (Signed)
PT Cancellation Note  Patient Details Name: GUY HENRICH MRN: [id number] DOB: Oct 02, 1940   Cancelled Treatment:    Reason Eval/Treat Not Completed: Other (comment). Pt has decided to go to comfort care. PT orders cancelled. If Acute PT needed in future please re-consult.  Lewis Shock, PT, DPT Acute Rehabilitation Services Secure chat preferred Office #: [phone]    Iona Hansen 08/30/2022, 10:27 AM

## 2022-08-30 NOTE — TOC Progression Note (Signed)
Transition of Care [facility]) - Progression Note    Patient Details  Name: Grant Stevens MRN: [id number] Date of Birth: 10-23-1940  Transition of Care [facility]) CM/SW Contact  Nicanor Bake Phone Number: [phone] 08/30/2022, 4:13 PM  Clinical Narrative: CSW received a call from Dorene Sorrow that the nurse came to see the pt and does not think that he is appropriate at this time and will be back reevaluate in two days.   TOC will continue following.     Expected Discharge Plan: Skilled Nursing Facility Barriers to Discharge: Continued Medical Work up  Expected Discharge Plan and Services In-house Referral: Clinical Social Work Discharge Planning Services: CM Consult Post Acute Care Choice: Skilled Nursing Facility Living arrangements for the past 2 months: Single Family Home Expected Discharge Date: 08/26/22                                     Social Determinants of Health (SDOH) Interventions SDOH Screenings   Food Insecurity: No Food Insecurity (08/24/2022)  Housing: Low Risk  (08/24/2022)  Transportation Needs: No Transportation Needs (08/24/2022)  Utilities: Not At Risk (08/24/2022)  Tobacco Use: Medium Risk (08/20/2022)    Readmission Risk Interventions     No data to display

## 2022-08-30 NOTE — Telephone Encounter (Signed)
Patient currently admitted at [facility] with NSTEMI diagnosis, should be seen in the office first. Has appointment with [facility] in November, should discuss colonoscopy at that time Thanks

## 2022-08-31 DIAGNOSIS — Z515 Encounter for palliative care: Secondary | ICD-10-CM | POA: Diagnosis not present

## 2022-08-31 DIAGNOSIS — Z66 Do not resuscitate: Secondary | ICD-10-CM

## 2022-08-31 DIAGNOSIS — Z7189 Other specified counseling: Secondary | ICD-10-CM | POA: Diagnosis not present

## 2022-08-31 DIAGNOSIS — I214 Non-ST elevation (NSTEMI) myocardial infarction: Secondary | ICD-10-CM | POA: Diagnosis not present

## 2022-08-31 MED ORDER — LORAZEPAM 2 MG/ML PO CONC: 1.0000 mg | ORAL | Status: DC | PRN

## 2022-08-31 MED ORDER — MORPHINE SULFATE (CONCENTRATE) 10 MG/0.5ML PO SOLN: 5.0000 mg | ORAL | Status: DC | PRN

## 2022-08-31 MED ORDER — LOPERAMIDE HCL 2 MG PO CAPS
2.0000 mg | ORAL_CAPSULE | ORAL | Status: DC | PRN
  Administered 2022-08-31 – 2022-09-01 (×3): 2 mg via ORAL
  Filled 2022-08-31 (×3): qty 1

## 2022-08-31 NOTE — Progress Notes (Addendum)
Palliative Medicine Inpatient Follow Up Note HPI: 82 year old male with PMH of CAD, s/p CABG, chronic systolic CHF, s/p PPM. The Palliative care team has been asked to get involved in the setting of Grant Stevens's end stage heart failure for further goals of care conversations.   Today's Discussion 08/31/2022  *Please note that this is a verbal dictation therefore any spelling or grammatical errors are due to the "Dragon Medical One" system interpretation.  Chart reviewed inclusive of vital signs, progress notes, laboratory results, and diagnostic images.   I met with Grant Stevens at bedside this afternoon. He was resting calmly in NAD. He and I discussed that he was able to sit up and eat some though he does feel fairly fatigued. We discussed in addition to this the likelihood that he will continue to experience symptoms. I encouraged him to request PRN medicine for pain, dyspnea, and anxiety. He does share having a tough time sleeping though the temazepam was able to alleviate this last night.  We reviewed and completed a most form together:  Cardiopulmonary Resuscitation: Do Not Attempt Resuscitation (DNR/No CPR)  Medical Interventions: Comfort Measures: Keep clean, warm, and dry. Use medication by any route, positioning, wound care, and other measures to relieve pain and suffering. Use oxygen, suction and manual treatment of airway obstruction as needed for comfort. Do not transfer to the hospital unless comfort needs cannot be met in current location.  Antibiotics: No antibiotics (use other measures to relieve symptoms)  IV Fluids: No IV fluids (provide other measures to ensure comfort)  Feeding Tube: No feeding tube   Created space and opportunity for patient to explore thoughts feelings and fears regarding current medical situation. He states that he wishes we could make him better though understands his life time will be limited.  I spoke to patients niece, Grant Stevens who is aware that Grant Stevens as of  last night had not been accepted to hospice home Grant Stevens) she and I reviewed the possible reasons why. We did discuss the contingency of Grant Stevens going to [facility] SNF if he remains to not be accepted to IP hospice tomorrow. She is agreeable to this.   Questions and concerns addressed/Palliative Support Provided.   Objective Assessment: Vital Signs Vitals:   08/31/22 0447 08/31/22 1210  BP: (!) 99/54 103/77  Pulse: 75 (!) 52  Resp: 18 17  Temp: 98.3 F (36.8 C)   SpO2: 97% 98%    Intake/Output Summary (Last 24 hours) at 08/31/2022 1344 Last data filed at 08/30/2022 1700 Gross per 24 hour  Intake 240 ml  Output --  Net 240 ml   Last Weight  Most recent update: 08/30/2022  6:08 AM    Weight  94.1 kg (207 lb 7.3 oz)            Gen:  Elderly  HEENT: moist mucous membranes CV: regular rate and irregular rhythm  PULM: On RA, breathing is even and not labored  ABD: soft/nontender  EXT: LE edema  Neuro: Alert and oriented x3   SUMMARY OF RECOMMENDATIONS   DNAR/DNI  MOST Completed, paper copy placed onto the chart electric copy can be found in [employer]  DNR Form Completed, paper copy placed onto the chart electric copy can be found in [employer]   Comfort Care   Anticipate symptom burden will worsen in the oncoming days given liberation from cardiac medications   Appreciate TOC referral to [facility] if not accepted on reassessment plan for Grant Stevens SNF placement with hospice  care   Ongoing PMT support  Total Time: 42 Billing based on MDM: High ______________________________________________________________________________________ Grant Stevens Palliative Medicine Team Team Cell Phone: [phone] Please utilize secure chat with additional questions, if there is no response within 30 minutes please call the above phone number  Palliative Medicine Team providers are available by phone from 7am to 7pm daily and can be reached through  the team cell phone.  Should this patient require assistance outside of these hours, please call the patient's attending physician.

## 2022-08-31 NOTE — TOC Progression Note (Signed)
Transition of Care [facility]) - Progression Note    Patient Details  Name: Grant Stevens MRN: [id number] Date of Birth: July 05, 1940  Transition of Care [facility]) CM/SW Contact  Nicanor Bake Phone Number: [phone] 08/31/2022, 12:14 PM  Clinical Narrative:  CSW and CM attempted to meet with pt at the bedside, but pt fell asleep while speaking.  CSW received a call from Cyndy Freeze that the facility is sending another nurse out to reevaluate the pt on 8/22. CSW asked the reason for the decline on 8/20, [facility] stated at this time the pt was not appropriate. CSW resubmitted auth for SNF as a plan B if the pt is not considered appropriate. TOC will continue following.     Expected Discharge Plan: Skilled Nursing Facility Barriers to Discharge: Continued Medical Work up  Expected Discharge Plan and Services In-house Referral: Clinical Social Work Discharge Planning Services: CM Consult Post Acute Care Choice: Skilled Nursing Facility Living arrangements for the past 2 months: Single Family Home Expected Discharge Date: 08/26/22                                     Social Determinants of Health (SDOH) Interventions SDOH Screenings   Food Insecurity: No Food Insecurity (08/24/2022)  Housing: Low Risk  (08/24/2022)  Transportation Needs: No Transportation Needs (08/24/2022)  Utilities: Not At Risk (08/24/2022)  Tobacco Use: Medium Risk (08/20/2022)    Readmission Risk Interventions     No data to display

## 2022-08-31 NOTE — [place] FL2 (Signed)
[place] MEDICAID FL2 LEVEL OF CARE FORM     IDENTIFICATION  Patient Name: Grant Stevens Birthdate: January 28, 1940 Sex: male Admission Date (Current Location): 08/20/2022  [facility] and [place] Number:  Producer, television/film/video and Address:  [facility] [place], [address], [place], [place] [address]      Provider Number: [id number]  Attending Physician Name and Address:  Zannie Cove, MD  Relative Name and Phone Number:       Current Level of Care: SNF Recommended Level of Care: Skilled Nursing Facility Prior Approval Number:    Date Approved/Denied:   PASRR Number: [id number]  Discharge Plan: SNF    Current Diagnoses: Patient Active Problem List   Diagnosis Date Noted   Acute on chronic systolic CHF (congestive heart failure) (HCC) 08/23/2022   Cardiogenic shock (HCC) 08/22/2022   NSTEMI (non-ST elevated myocardial infarction) (HCC) 08/20/2022   Hypotension 08/20/2022   Acute metabolic encephalopathy 07/15/2021   Dehydration 07/15/2021   Fall at home, initial encounter 07/15/2021   Tick bite 07/15/2021   Hoarseness of voice 07/08/2021   Dysphagia 07/08/2021   AKI (acute kidney injury) (HCC) 07/02/2021   Constipation 02/18/2021   Intermittent diarrhea 08/17/2020   Barrett's esophagus without dysplasia 08/17/2020   GERD (gastroesophageal reflux disease) 08/17/2020   Heart block AV complete (HCC) 05/12/2020   CAD (coronary artery disease) of artery bypass graft 05/12/2020   Syncope and collapse 05/12/2020   BPH (benign prostatic hyperplasia) 01/26/2015   Dizziness 10/19/2014   Coronary atherosclerosis of native coronary artery    History of atrial fibrillation    Essential hypertension, benign 09/22/2010   Mixed hyperlipidemia 11/28/2008   SLEEP APNEA 11/28/2008    Orientation RESPIRATION BLADDER Height & Weight     Self, Time, Situation, Place  Normal Continent Weight: 207 lb 7.3 oz (94.1 kg) Height:  6' (182.9 cm)  BEHAVIORAL  SYMPTOMS/MOOD NEUROLOGICAL BOWEL NUTRITION STATUS      Continent Diet (see discharge summary)  AMBULATORY STATUS COMMUNICATION OF NEEDS Skin   Limited Assist Verbally Normal                       Personal Care Assistance Level of Assistance  Bathing, Feeding, Dressing Bathing Assistance: Limited assistance Feeding assistance: Limited assistance Dressing Assistance: Limited assistance     Functional Limitations Info             SPECIAL CARE FACTORS FREQUENCY  PT (By licensed PT), OT (By licensed OT)     PT Frequency: 5x weekly OT Frequency: 5x weekly            Contractures      Additional Factors Info  Code Status, Allergies Code Status Info: DNR Allergies Info: Spironolactone           Current Medications (08/31/2022):  This is the current hospital active medication list Current Facility-Administered Medications  Medication Dose Route Frequency Provider Last Rate Last Admin   0.9 %  sodium chloride infusion  250 mL Intravenous PRN [place], Peter M, MD       acetaminophen (TYLENOL) tablet 650 mg  650 mg Oral Q6H PRN [place], Peter M, MD   650 mg at 08/27/22 3154   antiseptic oral rinse (BIOTENE) solution 15 mL  15 mL Topical PRN Ernie Avena, NP       bisacodyl (DULCOLAX) suppository 10 mg  10 mg Rectal Once [place], Peter M, MD       divalproex (DEPAKOTE) DR tablet  250 mg  250 mg Oral BID [place], Peter M, MD   250 mg at 08/31/22 1007   glycopyrrolate (ROBINUL) tablet 1 mg  1 mg Oral Q4H PRN Ernie Avena, NP       Or   glycopyrrolate (ROBINUL) injection 0.2 mg  0.2 mg Subcutaneous Q4H PRN Ernie Avena, NP       Or   glycopyrrolate (ROBINUL) injection 0.2 mg  0.2 mg Intravenous Q4H PRN Ernie Avena, NP       HYDROmorphone (DILAUDID) injection 0.5 mg  0.5 mg Intravenous Q1H PRN Ernie Avena, NP       hydrOXYzine (ATARAX) 10 MG/5ML syrup 10 mg  10 mg Oral TID PRN Janalyn Shy, Subrina, MD   10 mg at 08/30/22 0041   LORazepam  (ATIVAN) tablet 1 mg  1 mg Oral Q4H PRN Ernie Avena, NP       Or   LORazepam (ATIVAN) 2 MG/ML concentrated solution 1 mg  1 mg Sublingual Q4H PRN Ernie Avena, NP       Or   LORazepam (ATIVAN) tablet 1 mg  1 mg Oral Q4H PRN Ernie Avena, NP   1 mg at 08/31/22 7829   melatonin tablet 5 mg  5 mg Oral QHS Robbie Lis M, PA-C   5 mg at 08/30/22 2142   mirtazapine (REMERON SOL-TAB) disintegrating tablet 15 mg  15 mg Oral QHS Sundil, Subrina, MD   15 mg at 08/30/22 2142   morphine CONCENTRATE 10 MG/0.5ML oral solution 5 mg  5 mg Oral Q2H PRN Ernie Avena, NP       Or   morphine CONCENTRATE 10 MG/0.5ML oral solution 5 mg  5 mg Sublingual Q2H PRN Ernie Avena, NP       ondansetron (ZOFRAN-ODT) disintegrating tablet 4 mg  4 mg Oral Q6H PRN Ernie Avena, NP       Or   ondansetron [facility]) injection 4 mg  4 mg Intravenous Q6H PRN Ernie Avena, NP   4 mg at 08/30/22 1047   Oral care mouth rinse  15 mL Mouth Rinse PRN [place], Peter M, MD       polyethylene glycol [facility] Ethelene Hal) packet 17 g  17 g Oral BID [place], Peter M, MD   17 g at 08/29/22 0957   polyvinyl alcohol (LIQUIFILM TEARS) 1.4 % ophthalmic solution 1 drop  1 drop Both Eyes QID PRN Ernie Avena, NP       temazepam (RESTORIL) capsule 30 mg  30 mg Oral QHS Robbie Lis M, PA-C   30 mg at 08/30/22 2142     Discharge Medications: Please see discharge summary for a list of discharge medications.  Relevant Imaging Results:  Relevant Lab Results:   Additional Information    Reva Bores, LCSWA

## 2022-08-31 NOTE — Discharge Summary (Signed)
Physician Discharge Summary  Grant Stevens ZOX:[id number] DOB: July 30, 1940 DOA: 08/20/2022  PCP: Carylon Perches, MD  Admit date: 08/20/2022 Discharge date: 08/31/2022  Time spent: 45 minutes  Recommendations for Outpatient Follow-up:  Residential hospice for comfort focused care   Discharge Diagnoses:  Principal Problem:   NSTEMI (non-ST elevated myocardial infarction) (HCC) Acute on chronic systolic CHF Cardiogenic shock CAD/CABG Recurrent ventricular tachycardia End-stage heart failure   Acute on chronic systolic CHF (congestive heart failure) (HCC)   AKI (acute kidney injury) (HCC)   History of atrial fibrillation   GERD (gastroesophageal reflux disease)   Discharge Condition: Guarded  Diet recommendation: Comfort  Filed Weights   08/28/22 0500 08/29/22 0500 08/30/22 0500  Weight: 92.3 kg 94 kg 94.1 kg    History of present illness:  82 year old male with PMH of CAD, s/p CABG, chronic systolic CHF, s/p PPM, presented to ED on 08/20/2022 with complaints of diarrhea for 24 hours.  Also had intermittent chest pain with associated dyspnea and diaphoresis.  Hypotensive in the ED to 87/75 and hypoxic with oxygen saturation of 82%.  Admitted for acute on chronic systolic heart failure with cardiogenic shock, CAD with possible NSTEMI complicated by acute kidney injury.  Cardiology and nephrology consulting.  Treated with  IV milrinone drip and IV Lasix. -Volume status improved, milrinone discontinued 8/15 -Discharge planned 8/16, subsequently had prolonged monomorphic V. tach, discharge canceled, left heart cath noted occluded SVG to diagonal, medical management recommended, started on Amio gtt., EP consulted -8/18 with recurrent VT: EP following, bolused with lidocaine, currently on lidocaine drip along with Amio gtt.   Hospital course Acute on chronic systolic CHF with cardiogenic shock Advanced heart failure team following -TTE 8/11: LVEF <20% with severely reduced RV -Diuresed  with IV Lasix and milrinone, now off milrinone -10 L since admission and weight down by about 15 pounds. -Off GDMT due to shock and acute kidney injury.  - Not a candidate for advanced therapies per cardiology. -Initially plans to defer cath, subsequently had sustained V. tach, LHC 8/16 noted patent LIMA to LAD, SVG to PDA but occluded SVG to diagonal, normal filling pressures, medical management recommended -Poor prognosis with severe biventricular failure and recurrent ventricular tachycardia, felt to be end-stage cardiomyopathy, palliative care was recommended by advanced heart failure team and EP -Family meeting completed, he will discharge to residential hospice for comfort focused care   CAD with possible NSTEMI History of CAD s/p CABG Completed heparin x 48 hours. LHC showed patent LIMA-LAD and SVG-RCA, occluded SVG-diagonal.,  Medical management recommended -Now comfort care   Ventricular tachycardia -Recurrent episode 8/16, 10 minutes of sustained V. Tach -Amio gtt. Restarted -8/18 recurrent VT, amiodarone drip increased to 60 Mg per hour, bolused with lidocaine and started on lidocaine gtt., now off -Now comfort care   Hyperglycemia -Resolved   Acute kidney injury due to cardiogenic shock and ATN Baseline serum creatinine 1.1.  Creatinine peaked to 3.8, trended down to 1.8, now back up to 2.3   Hyperkalemia: Secondary to AKI.  Resolved.   Chronic right knee pain: Suspect osteoarthritis.  No history of trauma or gout.   Hyponatremia Multifactorial due to decompensated CHF, diuretics, AKI etc. -received 2 doses of tolvaptan in the last few days,  Poor prognosis  Sick sinus syndrome S/p pacemaker    Remote history of A-fib   GERD: PPI.   Anemia and thrombocytopenia: Stable.   Insomnia: -Remeron   Nausea and vomiting: -Improved with supportive care, -On laxatives for constipation  Hospital Course:  Acute on chronic systolic CHF with cardiogenic  shock Advanced heart failure team following -TTE 8/11: LVEF <20% with severely reduced RV -Diuresed with IV Lasix and milrinone, now off milrinone -10 L since admission and weight down by about 15 pounds. -Off GDMT due to shock and acute kidney injury.  - Not a candidate for advanced therapies per cardiology. -Initially plans to defer cath, subsequently had sustained V. tach, LHC 8/16 noted patent LIMA to LAD, SVG to PDA but occluded SVG to diagonal, normal filling pressures, medical management recommended -Poor prognosis, now full DNR, discussed hospice with patient, will attempt to reach sister today, left voicemail yesterday, palliative consulted   CAD with possible NSTEMI History of CAD s/p CABG Completed heparin x 48 hours. Continue DAPT with aspirin and Plavix. LHC showed patent LIMA-LAD and SVG-RCA, occluded SVG-diagonal.,  Medical management recommended -Ranexa added 8/18   Ventricular tachycardia -Recurrent episode 8/16, 10 minutes of sustained V. Tach -Amio gtt. Restarted -8/18 recurrent VT, amiodarone drip increased to 60 Mg per hour, bolused with lidocaine and started on lidocaine gtt., now off -Potassium low, repleted -Now full DNR -Poor prognosis, palliative consulted   Hyperglycemia -Resolved, A1c is 6.0, this was due to incorrect lab draw   Acute kidney injury due to cardiogenic shock and ATN Baseline serum creatinine 1.1.  Creatinine peaked to 3.8, trended down to 1.8, now back up to 2.3   Hyperkalemia: Secondary to AKI.  Resolved.   Chronic right knee pain: Suspect osteoarthritis.  No history of trauma or gout. As needed Tylenol and topical Voltaren gel.   Hyponatremia Multifactorial due to decompensated CHF, diuretics, AKI etc. -received 2 doses of tolvaptan in the last few days,  -?  Low output -Defer repeat tolvaptan to cards   Sick sinus syndrome S/p pacemaker with paced rhythm on telemetry at bedside   Remote history of A-fib Not on  anticoagulation.  Per cardiology if recurs, would need anticoagulation.   GERD: PPI.   Anemia and thrombocytopenia: Stable.   Insomnia: -Remeron   Nausea and vomiting: -Improved with supportive care, -On laxatives for constipation      Discharge Exam: Vitals:   08/30/22 1342 08/31/22 0447  BP: 100/67 (!) 99/54  Pulse: 75 75  Resp: 17 18  Temp: 98.2 F (36.8 C) 98.3 F (36.8 C)  SpO2: 98% 97%   Somnolent, resting comfortably, no distress CVS: S1-S2, regular rhythm Lungs: Decreased breath sounds at the bases Abdomen: Soft, nontender Extremities: No edema  Discharge Instructions   Discharge Instructions     Amb Referral to Cardiac Rehabilitation   Complete by: As directed    SNF at discharge.   Diagnosis: NSTEMI   After initial evaluation and assessments completed: Virtual Based Care may be provided alone or in conjunction with Phase 2 Cardiac Rehab based on patient barriers.: Yes   Intensive Cardiac Rehabilitation (ICR) [facility] location only OR Traditional Cardiac Rehabilitation (TCR) *If criteria for ICR are not met will enroll in TCR [facility] only): Yes      Allergies as of 08/31/2022       Reactions   Spironolactone Other (See Comments)   Hyperkalemia        Medication List     STOP taking these medications    atorvastatin 80 MG tablet Commonly known as: LIPITOR   bisoprolol 5 MG tablet Commonly known as: ZEBETA   empagliflozin 10 MG Tabs tablet Commonly known as: Jardiance   Entresto 24-26 MG Generic drug: sacubitril-valsartan   EPINEPHrine  0.3 mg/0.3 mL Soaj injection Commonly known as: EPI-PEN   furosemide 40 MG tablet Commonly known as: Lasix   LORazepam 0.5 MG tablet Commonly known as: ATIVAN Replaced by: LORazepam 2 MG/ML concentrated solution   silodosin 8 MG Caps capsule Commonly known as: RAPAFLO       TAKE these medications    aspirin EC 81 MG tablet Take 1 tablet (81 mg total) by mouth daily.   carbamazepine 200 MG  tablet Commonly known as: TEGRETOL Take 400 mg by mouth 2 (two) times daily.   divalproex 250 MG DR tablet Commonly known as: DEPAKOTE Take 250 mg by mouth 2 (two) times daily.   famotidine 40 MG tablet Commonly known as: PEPCID TAKE (1) TABLET BY MOUTH AT BEDTIME.   LORazepam 2 MG/ML concentrated solution Commonly known as: ATIVAN Place 0.5 mLs (1 mg total) under the tongue every 4 (four) hours as needed for anxiety. Replaces: LORazepam 0.5 MG tablet   melatonin 3 MG Tabs tablet Take 1 tablet (3 mg total) by mouth at bedtime.   morphine CONCENTRATE 10 MG/0.5ML Soln concentrated solution Place 0.25 mLs (5 mg total) under the tongue every 2 (two) hours as needed for moderate pain (or dyspnea).   pantoprazole 40 MG tablet Commonly known as: PROTONIX Take 1 tablet (40 mg total) by mouth daily before breakfast.   senna-docusate 8.6-50 MG tablet Commonly known as: Senokot-S Take 2 tablets by mouth at bedtime.   SUPER B COMPLEX PO Take 1 capsule by mouth daily.   temazepam 15 MG capsule Commonly known as: RESTORIL Take 1 capsule (15 mg total) by mouth at bedtime as needed for sleep.       ASK your doctor about these medications    polyethylene glycol 17 g packet Commonly known as: MIRALAX / GLYCOLAX Take 17 g by mouth 2 (two) times daily for 4 days. Ask about: Should I take this medication?       Allergies  Allergen Reactions   Spironolactone Other (See Comments)    Hyperkalemia    Contact information for follow-up providers     Carylon Perches, MD Follow up in 6 day(s).   Specialty: Internal Medicine Why: Follow up hospital appointment- Friday, September 02, 2022 at 9:45 am  Please arrive to appointment 15 minutes early ! Contact information: [address] [place] [address] [phone]              Contact information for after-discharge care     Destination     [facility] Preferred SNF .   Service: Skilled  Nursing Contact information: [address] [place] [address] [phone]                      The results of significant diagnostics from this hospitalization (including imaging, microbiology, ancillary and laboratory) are listed below for reference.    Significant Diagnostic Studies: DG CHEST PORT 1 VIEW  Result Date: 08/29/2022 CLINICAL DATA:  Shortness of breath. EXAM: PORTABLE CHEST 1 VIEW COMPARISON:  August 27, 2022. FINDINGS: The heart size and mediastinal contours are within normal limits. Status post coronary artery bypass graft. Left-sided pacemaker is unchanged in position. Minimal bibasilar subsegmental atelectasis is noted. The visualized skeletal structures are unremarkable. IMPRESSION: Minimal bibasilar subsegmental atelectasis. Electronically Signed   By: Lupita Raider M.D.   On: 08/29/2022 10:15   DG CHEST PORT 1 VIEW  Result Date: 08/27/2022 CLINICAL DATA:  PICC line placement. EXAM: PORTABLE  CHEST 1 VIEW COMPARISON:  08/20/2022 FINDINGS: Left-sided pacemaker unchanged. Sternotomy wires unchanged. Interval placement of right-sided PICC line with tip in the region of the cavoatrial junction. Lungs are hypoinflated with minimal linear left basilar density likely atelectasis. No effusion or pneumothorax. Cardiomediastinal silhouette and remainder of the exam is unchanged. IMPRESSION: 1. Hypoinflation with minimal linear left basilar density likely atelectasis. 2. Right-sided PICC line with tip in the region of the cavoatrial junction. Electronically Signed   By: Elberta Fortis M.D.   On: 08/27/2022 14:07   [place] EKG SITE RITE  Result Date: 08/27/2022 If Site Rite image not attached, placement could not be confirmed due to current cardiac rhythm.  CARDIAC CATHETERIZATION  Result Date: 08/26/2022   Ost LAD to Prox LAD lesion is 100% stenosed.   Prox LAD to Mid LAD lesion is 90% stenosed.   Ost RCA to Dist RCA lesion is 100% stenosed.   Origin to Prox  Graft lesion is 100% stenosed.   LIMA graft was visualized by angiography and is large.   SVG graft was visualized by angiography.   SVG graft was visualized by angiography and is normal in caliber.   The graft exhibits no disease.   The graft exhibits no disease.   LV end diastolic pressure is normal. Severe 2 vessel occlusive CAD. The left main and LCx are widely patent. Patent LIMA to the LAD. This does supply some flow into the first diagonal Occluded SVG to the diagonal Patent SVG to the right PDA Normal LV filling pressures. PCWP 12/14 mean 11 mm Hg. LVEDP 16 mm Hg Normal right  heart pressures. PAP 28/13 mean 21 mm Hg Cardiac output 5.08 L/min, index 2.37 Plan: medical management.   ECHOCARDIOGRAM COMPLETE  Result Date: 08/21/2022    ECHOCARDIOGRAM REPORT   Patient Name:   Grant Stevens Date of Exam: 08/21/2022 Medical Rec #:  [id number]        Height:       72.0 in Accession #:    [id number]       Weight:       214.7 lb Date of Birth:  August 03, 1940         BSA:          2.196 m Patient Age:    82 years         BP:           90/71 mmHg Patient Gender: M                HR:           60 bpm. Exam Location:  Inpatient Procedure: 2D Echo, Cardiac Doppler, Color Doppler and Intracardiac            Opacification Agent STAT ECHO REPORT CONTAINS CRITICAL RESULT Indications:    I50.40* Unspecified combined systolic (congestive) and diastolic                 (congestive) heart failure  History:        Patient has prior history of Echocardiogram examinations, most                 recent 03/03/2022. CHF, CAD, Previous Myocardial Infarction and                 Acute MI, Abnormal ECG, Prior CABG and Pacemaker,                 Arrythmias:Heart block and Atrial Fibrillation,  Signs/Symptoms:Syncope, Dizziness/Lightheadedness and                 Hypotension; Risk Factors:Former Smoker and Sleep Apnea.  Sonographer:    Sheralyn Boatman RDCS Referring Phys: [id number] [facility] A CHANDRASEKHAR IMPRESSIONS  1. Left  ventricular ejection fraction, by estimation, is <20%. The left ventricle has severely decreased function. The left ventricle demonstrates regional wall motion abnormalities (see scoring diagram/findings for description). The left ventricular internal cavity size was mildly to moderately dilated. Left ventricular diastolic parameters are consistent with Grade III diastolic dysfunction (restrictive).  2. Right ventricular systolic function is severely reduced. The right ventricular size is mildly enlarged. There is mildly elevated pulmonary artery systolic pressure. The estimated right ventricular systolic pressure is 38.9 mmHg.  3. The mitral valve is degenerative. Trivial mitral valve regurgitation. No evidence of mitral stenosis.  4. The aortic valve is tricuspid. There is moderate calcification of the aortic valve. Aortic valve regurgitation is not visualized. Aortic valve sclerosis is present, with no evidence of aortic valve stenosis.  5. The inferior vena cava is dilated in size with >50% respiratory variability, suggesting right atrial pressure of 8 mmHg. Comparison(s): Prior images reviewed side by side. LVEF is further decreased. No LV thrombus noted. FINDINGS  Left Ventricle: Left ventricular ejection fraction, by estimation, is <20%. The left ventricle has severely decreased function. The left ventricle demonstrates regional wall motion abnormalities. Definity contrast agent was given IV to delineate the left ventricular endocardial borders. The left ventricular internal cavity size was mildly to moderately dilated. There is no left ventricular hypertrophy. Left ventricular diastolic parameters are consistent with Grade III diastolic dysfunction (restrictive).  LV Wall Scoring: The apical lateral segment, apical septal segment, apical anterior segment, and apical inferior segment are aneurysmal. The mid anteroseptal segment, mid inferolateral segment, mid anterolateral segment, mid inferoseptal segment,  mid anterior segment, and mid inferior segment are akinetic. The basal anteroseptal segment, basal inferolateral segment, basal anterolateral segment, basal anterior segment, basal inferior segment, and basal inferoseptal segment are hypokinetic. Right Ventricle: The right ventricular size is mildly enlarged. No increase in right ventricular wall thickness. Right ventricular systolic function is severely reduced. There is mildly elevated pulmonary artery systolic pressure. The tricuspid regurgitant velocity is 2.78 m/s, and with an assumed right atrial pressure of 8 mmHg, the estimated right ventricular systolic pressure is 38.9 mmHg. Left Atrium: Left atrial size was normal in size. Right Atrium: Right atrial size was normal in size. Pericardium: There is no evidence of pericardial effusion. Mitral Valve: The mitral valve is degenerative in appearance. Trivial mitral valve regurgitation. No evidence of mitral valve stenosis. Tricuspid Valve: The tricuspid valve is grossly normal. Tricuspid valve regurgitation is not demonstrated. No evidence of tricuspid stenosis. Aortic Valve: The aortic valve is tricuspid. There is moderate calcification of the aortic valve. Aortic valve regurgitation is not visualized. Aortic valve sclerosis is present, with no evidence of aortic valve stenosis. Pulmonic Valve: The pulmonic valve was normal in structure. Pulmonic valve regurgitation is not visualized. No evidence of pulmonic stenosis. Aorta: The aortic root and ascending aorta are structurally normal, with no evidence of dilitation. Venous: The inferior vena cava is dilated in size with greater than 50% respiratory variability, suggesting right atrial pressure of 8 mmHg. IAS/Shunts: No atrial level shunt detected by color flow Doppler.  LEFT VENTRICLE PLAX 2D LVIDd:         5.90 cm      Diastology LVIDs:         5.60  cm      LV e' medial:    3.16 cm/s LV PW:         1.80 cm      LV E/e' medial:  18.7 LV IVS:        1.20 cm       LV e' lateral:   8.11 cm/s LVOT diam:     2.40 cm      LV E/e' lateral: 7.3 LV SV:         43 LV SV Index:   19 LVOT Area:     4.52 cm  LV Volumes (MOD) LV vol d, MOD A2C: 165.0 ml LV vol d, MOD A4C: 207.0 ml LV vol s, MOD A2C: 152.0 ml LV vol s, MOD A4C: 191.0 ml LV SV MOD A2C:     13.0 ml LV SV MOD A4C:     207.0 ml LV SV MOD BP:      15.0 ml RIGHT VENTRICLE            IVC RV S prime:     2.61 cm/s  IVC diam: 3.30 cm TAPSE (M-mode): 0.6 cm LEFT ATRIUM             Index        RIGHT ATRIUM           Index LA diam:        4.90 cm 2.23 cm/m   RA Area:     20.20 cm LA Vol (A2C):   44.7 ml 20.36 ml/m  RA Volume:   64.90 ml  29.56 ml/m LA Vol (A4C):   68.8 ml 31.33 ml/m LA Biplane Vol: 57.7 ml 26.28 ml/m  AORTIC VALVE LVOT Vmax:   70.30 cm/s LVOT Vmean:  44.800 cm/s LVOT VTI:    0.094 m  AORTA Ao Root diam: 3.60 cm Ao Asc diam:  3.80 cm MITRAL VALVE               TRICUSPID VALVE MV Area (PHT): 4.31 cm    TR Peak grad:   30.9 mmHg MV Decel Time: 176 msec    TR Vmax:        278.00 cm/s MV E velocity: 59.20 cm/s MV A velocity: 44.50 cm/s  SHUNTS MV E/A ratio:  1.33        Systemic VTI:  0.09 m                            Systemic Diam: 2.40 cm Riley Lam MD Electronically signed by Riley Lam MD Signature Date/Time: 08/21/2022/8:54:00 AM    Final    DG CHEST PORT 1 VIEW  Result Date: 08/20/2022 CLINICAL DATA:  Check central line placement EXAM: PORTABLE CHEST 1 VIEW COMPARISON:  Film from earlier in the same day. FINDINGS: Left jugular central line is noted with the catheter tip at the cavoatrial junction. No pneumothorax is noted. Cardiac shadow is stable. Pacing device and postsurgical changes are again seen. The lungs are clear bilaterally. IMPRESSION: No pneumothorax following central line placement. No acute abnormality noted. Electronically Signed   By: Alcide Clever M.D.   On: 08/20/2022 20:16   US RENAL  Result Date: 08/20/2022 CLINICAL DATA:  Acute renal injury EXAM: RENAL /  URINARY TRACT ULTRASOUND COMPLETE COMPARISON:  None Available. FINDINGS: Right Kidney: Renal measurements: 11.7 x 4.5 x 4.6 cm. = volume: 125 mL. Echogenicity within normal limits. No mass or hydronephrosis visualized. Left Kidney: Renal  measurements: 12.1 x 5.2 x 4.5 cm. = volume: 148 mL. Echogenicity within normal limits. No mass or hydronephrosis visualized. Bladder: Decompressed Other: None. IMPRESSION: Unremarkable kidneys.  No obstructive changes are seen. Electronically Signed   By: Alcide Clever M.D.   On: 08/20/2022 19:59   DG Chest Port 1 View  Result Date: 08/20/2022 CLINICAL DATA:  82 year old male with history of chest pain. Diarrhea for the past 3 days. EXAM: PORTABLE CHEST 1 VIEW COMPARISON:  Chest x-ray 07/15/2021. FINDINGS: Lung volumes are slightly low. No consolidative airspace disease. No pleural effusions. No pneumothorax. No pulmonary nodule or mass noted. Pulmonary vasculature and the cardiomediastinal silhouette are within normal limits. Atherosclerotic calcifications in the thoracic aorta. Left-sided pacemaker device in place with lead tips projecting over the expected location of the right atrium and right ventricle. Status post median sternotomy for CABG. IMPRESSION: 1. No radiographic evidence of acute cardiopulmonary disease. 2. Aortic atherosclerosis. Electronically Signed   By: Trudie Reed M.D.   On: 08/20/2022 12:22   CUP PACEART REMOTE DEVICE CHECK  Result Date: 08/10/2022 Scheduled remote reviewed. Normal device function.  Next remote 91 days. LA, CVRS   Microbiology: Recent Results (from the past 240 hour(s))  Respiratory (~20 pathogens) panel by PCR     Status: None   Collection Time: 08/22/22  7:38 AM   Specimen: Nasopharyngeal Swab; Respiratory  Result Value Ref Range Status   Adenovirus NOT DETECTED NOT DETECTED Final   Coronavirus 229E NOT DETECTED NOT DETECTED Final    Comment: (NOTE) The Coronavirus on the Respiratory Panel, DOES NOT test for the novel   Coronavirus (2019 nCoV)    Coronavirus HKU1 NOT DETECTED NOT DETECTED Final   Coronavirus NL63 NOT DETECTED NOT DETECTED Final   Coronavirus OC43 NOT DETECTED NOT DETECTED Final   Metapneumovirus NOT DETECTED NOT DETECTED Final   Rhinovirus / Enterovirus NOT DETECTED NOT DETECTED Final   Influenza A NOT DETECTED NOT DETECTED Final   Influenza B NOT DETECTED NOT DETECTED Final   Parainfluenza Virus 1 NOT DETECTED NOT DETECTED Final   Parainfluenza Virus 2 NOT DETECTED NOT DETECTED Final   Parainfluenza Virus 3 NOT DETECTED NOT DETECTED Final   Parainfluenza Virus 4 NOT DETECTED NOT DETECTED Final   Respiratory Syncytial Virus NOT DETECTED NOT DETECTED Final   Bordetella pertussis NOT DETECTED NOT DETECTED Final   Bordetella Parapertussis NOT DETECTED NOT DETECTED Final   Chlamydophila pneumoniae NOT DETECTED NOT DETECTED Final   Mycoplasma pneumoniae NOT DETECTED NOT DETECTED Final    Comment: Performed at [facility], [address] [place], [place] [address]  SARS Coronavirus 2 by RT PCR (hospital order, performed in [facility] hospital lab) *cepheid single result test* Anterior Nasal Swab     Status: None   Collection Time: 08/22/22  7:38 AM   Specimen: Anterior Nasal Swab  Result Value Ref Range Status   SARS Coronavirus 2 by RT PCR NEGATIVE NEGATIVE Final    Comment: Performed at [facility], [address], [place], [place] [address]     Labs: Basic Metabolic Panel: Recent Labs  Lab 08/26/22 0428 08/26/22 1534 08/27/22 0042 08/27/22 0936 08/28/22 0455 08/28/22 1700 08/29/22 0442 08/29/22 0749 08/29/22 1002 08/29/22 2123 08/30/22 0311  NA 125*   < > 121*   < > 124* 122* 117* 120* 121* 117* 123*  K 3.8   < > 4.4   < > 3.8 4.8 3.6 3.6 3.8  --  4.6  CL 81*  --  80*   < > 81* 80* 76* 77* 81*  --  83*  CO2 30  --  27   < > 29 27 27 26 28   --  25  GLUCOSE 87  --  86   < > 98 124* 359* 368* 97  --  102*  BUN 68*  --  72*   < > 72* 75* 66* 66* 70*  --  73*   CREATININE 1.97*   < > 1.82*   < > 2.12* 2.30* 2.30* 2.35* 2.34*  --  2.56*  CALCIUM 8.6*  --  8.5*   < > 8.7* 8.7* 7.8* 8.2* 8.6*  --  8.6*  MG 1.9  --  2.4  --  2.2  --  2.1  --   --   --  2.1  PHOS  --   --   --   --   --   --  3.7  --   --   --   --    < > = values in this interval not displayed.   Liver Function Tests: Recent Labs  Lab 08/24/22 2151 08/27/22 0936 08/28/22 0950 08/29/22 0442  AST 22 22 20 19   ALT 38 29 23 19   ALKPHOS 83 96 84 77  BILITOT 0.5 0.7 0.4 0.5  PROT 6.6 6.9 6.2* 5.6*  ALBUMIN 3.6 3.6 3.1* 2.9*   No results for input(s): "LIPASE", "AMYLASE" in the last 168 hours. No results for input(s): "AMMONIA" in the last 168 hours. CBC: Recent Labs  Lab 08/27/22 0042 08/28/22 0455 08/29/22 0442 08/29/22 0749 08/30/22 0311  WBC 11.1* 12.2* 9.3 10.4 12.7*  NEUTROABS  --   --  7.9*  --   --   HGB 12.6* 12.1* 10.7* 11.6* 11.4*  HCT 36.4* 35.5* 31.5* 34.1* 33.7*  MCV 90.1 88.8 89.0 88.6 89.4  PLT 104* 108* 91* 95* 85*   Cardiac Enzymes: No results for input(s): "CKTOTAL", "CKMB", "CKMBINDEX", "TROPONINI" in the last 168 hours. BNP: BNP (last 3 results) Recent Labs    08/21/22 0441  BNP 2,520.5*    ProBNP (last 3 results) No results for input(s): "PROBNP" in the last 8760 hours.  CBG: Recent Labs  Lab 08/29/22 1120 08/29/22 1559 08/29/22 2141 08/30/22 0611 08/30/22 1140  GLUCAP 110* 106* 120* 100* 114*       Signed:  Zannie Cove MD.  [facility] 08/31/2022, 10:56 AM

## 2022-08-31 NOTE — Progress Notes (Signed)
Patient ID: Grant Stevens, male   DOB: 1940-04-16, 82 y.o.   MRN: [id number]     Advanced Heart Failure Rounding Note  PCP-Cardiologist: Nona Dell, MD   Subjective:    Met with Palliative Care yesterday. Transitioning to comfort care. Plans to discharge to inpatient hospice once accepted.  No dyspnea at rest. Only complaint is trouble sleeping.    Objective:   Weight Range: 94.1 kg Body mass index is 28.14 kg/m.   Vital Signs:   Temp:  [97.9 F (36.6 C)-98.3 F (36.8 C)] 98.3 F (36.8 C) (08/21 0447) Pulse Rate:  [75] 75 (08/21 0447) Resp:  [11-21] 18 (08/21 0447) BP: (99-105)/(54-79) 99/54 (08/21 0447) SpO2:  [95 %-98 %] 97 % (08/21 0447) Last BM Date : 08/30/22  Weight change: Filed Weights   08/28/22 0500 08/29/22 0500 08/30/22 0500  Weight: 92.3 kg 94 kg 94.1 kg    Intake/Output:   Intake/Output Summary (Last 24 hours) at 08/31/2022 1022 Last data filed at 08/30/2022 1700 Gross per 24 hour  Intake 260.97 ml  Output --  Net 260.97 ml      Physical Exam   General:  NO distress. HEENT: normal Neck: supple. JVP 10-12. Carotids 2+ bilat; no bruits.  Cor: Regular rate & rhythm. No rubs, gallops or murmurs. Lungs: no respiratory distress Abdomen: soft, nontender, nondistended.  Extremities: no cyanosis, clubbing, rash, edema Neuro: alert & orientedx3. Affect pleasant    Telemetry   Off telemetry  Labs    CBC Recent Labs    08/29/22 0442 08/29/22 0749 08/30/22 0311  WBC 9.3 10.4 12.7*  NEUTROABS 7.9*  --   --   HGB 10.7* 11.6* 11.4*  HCT 31.5* 34.1* 33.7*  MCV 89.0 88.6 89.4  PLT 91* 95* 85*   Basic Metabolic Panel Recent Labs    40/98/11 0442 08/29/22 0749 08/29/22 1002 08/29/22 2123 08/30/22 0311  NA 117*   < > 121* 117* 123*  K 3.6   < > 3.8  --  4.6  CL 76*   < > 81*  --  83*  CO2 27   < > 28  --  25  GLUCOSE 359*   < > 97  --  102*  BUN 66*   < > 70*  --  73*  CREATININE 2.30*   < > 2.34*  --  2.56*  CALCIUM 7.8*   <  > 8.6*  --  8.6*  MG 2.1  --   --   --  2.1  PHOS 3.7  --   --   --   --    < > = values in this interval not displayed.   Liver Function Tests Recent Labs    08/29/22 0442  AST 19  ALT 19  ALKPHOS 77  BILITOT 0.5  PROT 5.6*  ALBUMIN 2.9*    No results for input(s): "LIPASE", "AMYLASE" in the last 72 hours.  Cardiac Enzymes No results for input(s): "CKTOTAL", "CKMB", "CKMBINDEX", "TROPONINI" in the last 72 hours.  BNP: BNP (last 3 results) Recent Labs    08/21/22 0441  BNP 2,520.5*    ProBNP (last 3 results) No results for input(s): "PROBNP" in the last 8760 hours.   D-Dimer No results for input(s): "DDIMER" in the last 72 hours. Hemoglobin A1C No results for input(s): "HGBA1C" in the last 72 hours.  Fasting Lipid Panel No results for input(s): "CHOL", "HDL", "LDLCALC", "TRIG", "CHOLHDL", "LDLDIRECT" in the last 72 hours.  Thyroid Function Tests Recent Labs  08/29/22 0439  TSH 6.222*     Other results:   Imaging    No results found.   Medications:     Scheduled Medications:  bisacodyl  10 mg Rectal Once   divalproex  250 mg Oral BID   melatonin  5 mg Oral QHS   mirtazapine  15 mg Oral QHS   polyethylene glycol  17 g Oral BID   temazepam  30 mg Oral QHS    Infusions:  sodium chloride      PRN Medications: sodium chloride, acetaminophen, antiseptic oral rinse, glycopyrrolate **OR** glycopyrrolate **OR** glycopyrrolate, HYDROmorphone (DILAUDID) injection, hydrOXYzine, LORazepam **OR** LORazepam **OR** LORazepam, morphine CONCENTRATE **OR** morphine CONCENTRATE, ondansetron **OR** ondansetron (ZOFRAN) IV, mouth rinse, polyvinyl alcohol   Assessment/Plan    1. Acute on chronic systolic HF -> cardiogenic shock - In setting of OOH infarct (loss of SVG-D) - Echo 2/24 EF 25-30% RV mildly reduced - Echo 08/21/22: EF < 20% RV severely reduced - Initial co-ox 31%. Started on milrinone and diuresed w/ IV Lasix - Milrinone stopped 8/15.   -  D/w EP. He has very wide paced 250 msec QRS (chronic RV pacing) but doubt left bundle lead placement would help his overall situation.  - Has severe HFrEF and ischemic VT. Not a candidate for advanced therapies. Limited options. Has decided to purse comfort care and discharge to inpatient hospice. -All lab draws have been discontinued. No longer on telemetry.  2. CAD with NSTEMI - h/o CAD s/p CABG  - suspect OOH infarct with loss of SVG-D - HS troponin > 13K - LHC showed patent LIMA-LAD and SVG-RCA, new occlusion SVG-D without interventional option.    3. Ventricular tachycardia - Episode again on 8/16, amiodarone gtt restarted.   - Suspect ischemia-mediated VT, seen by EP.  - Salvos of slow VT in 110s then faster VT in 150s, separated by short sinus bursts.  Discussed with Dr. Graciela Husbands, suspect triggered by ischemia (recent occlusion SVG-D with ischemic penumbra).  - not candidate for ICD upgrade given age and co morbidities. Now DNR   4. AKI - due to shock/ATN. Rise may be related to contrast load with cath on Friday.  - baseline Scr 1.1 , peaked at 3.8. 1.82 => 2.12=>2.30=>2.56   5. SSS/CHB - s/p PPM, unable to place CS lead in past.  Has wide paced QRS 250 msec. Chronic RV pacing.  - d/w EP, no plans to upgrade device   6. Remote history of atrial fibrillation - Has not been anticoagulated - No recurrence this admission.  7. Hyponatremia - Na 123 yesterday, in setting of severe RV failure  - can ease fluid restriction now that transitioning to hospice care   8. GOC - Transitioned to comfort care.  - Plan for discharge to inpatient hospice once accepted by facility     [facility], Ayomide Zuleta N, PA-C  10:22 AM

## 2022-09-01 DIAGNOSIS — Z515 Encounter for palliative care: Secondary | ICD-10-CM

## 2022-09-01 DIAGNOSIS — I5023 Acute on chronic systolic (congestive) heart failure: Secondary | ICD-10-CM | POA: Diagnosis not present

## 2022-09-01 DIAGNOSIS — I214 Non-ST elevation (NSTEMI) myocardial infarction: Secondary | ICD-10-CM | POA: Diagnosis not present

## 2022-09-01 NOTE — TOC Progression Note (Signed)
[facility]) - Progression Note    Patient Details  Name: Grant Stevens MRN: [id number] Date of Birth: 02-May-1940  Transition of Care [facility]) CM/SW Contact  Nicanor Bake Phone Number: [phone] 09/01/2022, 4:00 PM  Clinical Narrative:   CSW called and spoke with [facility], [place] [phone]. They do have a bed available. Georga Hacking stated that  if the pt is coming in with hospice it would have to be private pay with a 30 day payment in advance. CSW called and spoke with the pts niece, [name]. [name] stated that the family needed some time to discuss their options. CSW will follow up with family on 8/23. TOC will continue following.     Expected Discharge Plan: Skilled Nursing Facility Barriers to Discharge: Continued Medical Work up  Expected Discharge Plan and Services In-house Referral: Clinical Social Work Discharge Planning Services: CM Consult Post Acute Care Choice: Skilled Nursing Facility Living arrangements for the past 2 months: Single Family Home Expected Discharge Date: 08/26/22                                     Social Determinants of Health (SDOH) Interventions SDOH Screenings   Food Insecurity: No Food Insecurity (08/24/2022)  Housing: Low Risk  (08/24/2022)  Transportation Needs: No Transportation Needs (08/24/2022)  Utilities: Not At Risk (08/24/2022)  Tobacco Use: Medium Risk (08/20/2022)    Readmission Risk Interventions     No data to display

## 2022-09-01 NOTE — Plan of Care (Signed)
  Problem: Education: Goal: Knowledge of General Education information will improve Description: Including pain rating scale, medication(s)/side effects and non-pharmacologic comfort measures Outcome: Progressing   Problem: Health Behavior/Discharge Planning: Goal: Ability to manage health-related needs will improve Outcome: Progressing   Problem: Clinical Measurements: Goal: Ability to maintain clinical measurements within normal limits will improve Outcome: Progressing Goal: Will remain free from infection Outcome: Progressing Goal: Diagnostic test results will improve Outcome: Progressing Goal: Respiratory complications will improve Outcome: Progressing Goal: Cardiovascular complication will be avoided Outcome: Progressing   Problem: Activity: Goal: Risk for activity intolerance will decrease Outcome: Progressing   Problem: Coping: Goal: Level of anxiety will decrease Outcome: Progressing   Problem: Elimination: Goal: Will not experience complications related to bowel motility Outcome: Progressing Goal: Will not experience complications related to urinary retention Outcome: Progressing   Problem: Pain Managment: Goal: General experience of comfort will improve Outcome: Progressing   Problem: Safety: Goal: Ability to remain free from injury will improve Outcome: Progressing   Problem: Skin Integrity: Goal: Risk for impaired skin integrity will decrease Outcome: Progressing   Problem: Education: Goal: Understanding of CV disease, CV risk reduction, and recovery process will improve Outcome: Progressing Goal: Individualized Educational Video(s) Outcome: Progressing   Problem: Activity: Goal: Ability to return to baseline activity level will improve Outcome: Progressing   Problem: Cardiovascular: Goal: Ability to achieve and maintain adequate cardiovascular perfusion will improve Outcome: Progressing Goal: Vascular access site(s) Level 0-1 will be  maintained Outcome: Progressing   Problem: Health Behavior/Discharge Planning: Goal: Ability to safely manage health-related needs after discharge will improve Outcome: Progressing   Problem: Education: Goal: Ability to describe self-care measures that may prevent or decrease complications (Diabetes Survival Skills Education) will improve Outcome: Progressing Goal: Individualized Educational Video(s) Outcome: Progressing   Problem: Coping: Goal: Ability to adjust to condition or change in health will improve Outcome: Progressing   Problem: Fluid Volume: Goal: Ability to maintain a balanced intake and output will improve Outcome: Progressing   Problem: Health Behavior/Discharge Planning: Goal: Ability to identify and utilize available resources and services will improve Outcome: Progressing Goal: Ability to manage health-related needs will improve Outcome: Progressing   Problem: Metabolic: Goal: Ability to maintain appropriate glucose levels will improve Outcome: Progressing   Problem: Nutritional: Goal: Maintenance of adequate nutrition will improve Outcome: Progressing Goal: Progress toward achieving an optimal weight will improve Outcome: Progressing   Problem: Skin Integrity: Goal: Risk for impaired skin integrity will decrease Outcome: Progressing   Problem: Tissue Perfusion: Goal: Adequacy of tissue perfusion will improve Outcome: Progressing   Problem: Education: Goal: Knowledge of the prescribed therapeutic regimen will improve Outcome: Progressing   Problem: Coping: Goal: Ability to identify and develop effective coping behavior will improve Outcome: Progressing   Problem: Clinical Measurements: Goal: Quality of life will improve Outcome: Progressing   Problem: Respiratory: Goal: Verbalizations of increased ease of respirations will increase Outcome: Progressing   Problem: Role Relationship: Goal: Family's ability to cope with current situation  will improve Outcome: Progressing Goal: Ability to verbalize concerns, feelings, and thoughts to partner or family member will improve Outcome: Progressing   Problem: Pain Management: Goal: Satisfaction with pain management regimen will improve Outcome: Progressing

## 2022-09-01 NOTE — Progress Notes (Signed)
  Palliative Medicine Inpatient Follow Up Note HPI: 82 year old male with PMH of CAD, s/p CABG, chronic systolic CHF, s/p PPM. The Palliative care team has been asked to get involved in the setting of Grant Stevens's end stage heart failure for further goals of care conversations.   Today's Discussion 09/01/2022  *Please note that this is a verbal dictation therefore any spelling or grammatical errors are due to the "Dragon Medical One" system interpretation.  Chart reviewed inclusive of vital signs, progress notes, laboratory results, and diagnostic images. Patient assessed at the bedside and he is sleeping comfortably. Did not attempt to arouse in order to preserve comfort. Noted use of PRN Ativan overnight x1. No family present during my visit.   Objective Assessment: Vital Signs Vitals:   08/31/22 2010 09/01/22 0453  BP: (!) 125/106 (!) 104/55  Pulse: 80 69  Resp: 18 16  Temp: 98.2 F (36.8 C) 98.3 F (36.8 C)  SpO2: 100% 100%    Gen:  Elderly, sleeping CV: regular rate  PULM: On RA, breathing is even and not labored  EXT: LE edema   SUMMARY OF RECOMMENDATIONS   -Continue DNAR/DNI -Continue Comfort Care. Anticipate symptom burden will worsen in the oncoming days given liberation from cardiac medications  -Await reassessment from [facility] If not accepted, plan for [facility] SNF placement with hospice care as previously discussed with patient/family -Ongoing PMT support   Billing based on MDM: Low   Grant Stevens [facility] Palliative Medicine Team Team Cell Phone: [phone] Please utilize secure chat with additional questions, if there is no response within 30 minutes please call the above phone number  Palliative Medicine Team providers are available by phone from 7am to 7pm daily and can be reached through the team cell phone.  Should this patient require assistance outside of these hours, please call the patient's attending  physician.

## 2022-09-01 NOTE — TOC Progression Note (Signed)
Transition of Care [facility]) - Progression Note    Patient Details  Name: Grant Stevens MRN: [id number] Date of Birth: [birth year]  Transition of Care [facility]) CM/SW Contact  Nicanor Bake Phone Number: [phone] 09/01/2022, 3:16 PM  Clinical Narrative:   HF CSW spoke with nurse, Marchelle Folks and during the hospice re-eval that the pt is still not appropriate. Recommending SNF setting. CSW followed up with [facility] SN[facility] [name]), [place] to update her and see if bed was still available. They still have beds. CSW updated the pts family. Pts family requested the pt go to [facility] because it is closer to the pt support system. CSW will follow up TOC will continue following.      Expected Discharge Plan: Skilled Nursing Facility Barriers to Discharge: Continued Medical Work up  Expected Discharge Plan and Services In-house Referral: Clinical Social Work Discharge Planning Services: CM Consult Post Acute Care Choice: Skilled Nursing Facility Living arrangements for the past 2 months: Single Family Home Expected Discharge Date: 08/26/22                                     Social Determinants of Health (SDOH) Interventions SDOH Screenings   Food Insecurity: No Food Insecurity (08/24/2022)  Housing: Low Risk  (08/24/2022)  Transportation Needs: No Transportation Needs (08/24/2022)  Utilities: Not At Risk (08/24/2022)  Tobacco Use: Medium Risk (08/20/2022)    Readmission Risk Interventions     No data to display

## 2022-09-01 NOTE — Care Management Important Message (Signed)
Important Message  Patient Details  Name: Grant Stevens MRN: [id number] Date of Birth: 11-10-1940   Medicare Important Message Given:  Yes     Renie Ora 09/01/2022, 11:36 AM

## 2022-09-01 NOTE — Plan of Care (Signed)
  Problem: Education: Goal: Knowledge of General Education information will improve Description: Including pain rating scale, medication(s)/side effects and non-pharmacologic comfort measures Outcome: Progressing   Problem: Health Behavior/Discharge Planning: Goal: Ability to manage health-related needs will improve Outcome: Progressing   Problem: Clinical Measurements: Goal: Ability to maintain clinical measurements within normal limits will improve Outcome: Progressing Goal: Will remain free from infection Outcome: Progressing Goal: Diagnostic test results will improve Outcome: Progressing Goal: Respiratory complications will improve Outcome: Progressing Goal: Cardiovascular complication will be avoided Outcome: Progressing   Problem: Activity: Goal: Risk for activity intolerance will decrease Outcome: Progressing   Problem: Coping: Goal: Level of anxiety will decrease Outcome: Progressing   Problem: Elimination: Goal: Will not experience complications related to bowel motility Outcome: Progressing Goal: Will not experience complications related to urinary retention Outcome: Progressing   Problem: Pain Managment: Goal: General experience of comfort will improve Outcome: Progressing   Problem: Safety: Goal: Ability to remain free from injury will improve Outcome: Progressing   Problem: Skin Integrity: Goal: Risk for impaired skin integrity will decrease Outcome: Progressing   Problem: Education: Goal: Understanding of CV disease, CV risk reduction, and recovery process will improve Outcome: Progressing Goal: Individualized Educational Video(s) Outcome: Progressing

## 2022-09-01 NOTE — Progress Notes (Signed)
PROGRESS NOTE    Grant Stevens  XBJ:[id number]  DOB: 02-22-1940  DOA: 08/20/2022 PCP: Grant Perches, MD Outpatient Specialists:   Hospital course:  82 year old male with PMH of CAD, s/p CABG, chronic systolic CHF, s/p PPM, presented to ED on 08/20/2022 with complaints of diarrhea for 24 hours.  Also had intermittent chest pain with associated dyspnea and diaphoresis.  Hypotensive in the ED to 87/75 and hypoxic with oxygen saturation of 82%.  Admitted for acute on chronic systolic heart failure with cardiogenic shock, CAD with possible NSTEMI complicated by acute kidney injury.  Treatment was complicated by prolonged monomorphic VT and left heart cath noted occluded SVG to diagonal.  Repeat TTE showed EF less than 20% and severely reduced RV.  Patient was transitioned over to comfort care due to biventricular failure, VT manage no further advanced therapies available.  Subjective:  Patient states that he is feeling okay, maybe even a little better.  He just wishes that he could get better, that his heart would be better.   Objective: Vitals:   08/31/22 1210 08/31/22 2010 09/01/22 0453 09/01/22 1549  BP: 103/77 (!) 125/106 (!) 104/55 (!) 109/90  Pulse: (!) 52 80 69 89  Resp: 17 18 16 17   Temp:  98.2 F (36.8 C) 98.3 F (36.8 C) 98.4 F (36.9 C)  TempSrc:  Oral Oral Oral  SpO2: 98% 100% 100% 100%  Weight:      Height:        Intake/Output Summary (Last 24 hours) at 09/01/2022 1845 Last data filed at 09/01/2022 1300 Gross per 24 hour  Intake 240 ml  Output 1000 ml  Net -760 ml   Filed Weights   08/28/22 0500 08/29/22 0500 08/30/22 0500  Weight: 92.3 kg 94 kg 94.1 kg     Exam:  General: Tired appearing gentleman in reasonably good spirits sitting up in bed in NAD able to speak in full sentences without difficulty Eyes: sclera anicteric, conjuctiva mild injection bilaterally CVS: S1-S2, regular  Respiratory:  decreased air entry bilaterally secondary to decreased  inspiratory effort, rales at bases  GI: NABS, soft, NT  LE: Warm and well-perfused Neuro: A/O x 3,  grossly nonfocal.  Psych: patient is logical and coherent, judgement and insight appear normal, mood and affect appropriate to situation.  Data Reviewed:  Basic Metabolic Panel: Recent Labs  Lab 08/26/22 0428 08/26/22 1534 08/27/22 0042 08/27/22 0936 08/28/22 0455 08/28/22 1700 08/29/22 0442 08/29/22 0749 08/29/22 1002 08/29/22 2123 08/30/22 0311  NA 125*   < > 121*   < > 124* 122* 117* 120* 121* 117* 123*  K 3.8   < > 4.4   < > 3.8 4.8 3.6 3.6 3.8  --  4.6  CL 81*  --  80*   < > 81* 80* 76* 77* 81*  --  83*  CO2 30  --  27   < > 29 27 27 26 28   --  25  GLUCOSE 87  --  86   < > 98 124* 359* 368* 97  --  102*  BUN 68*  --  72*   < > 72* 75* 66* 66* 70*  --  73*  CREATININE 1.97*   < > 1.82*   < > 2.12* 2.30* 2.30* 2.35* 2.34*  --  2.56*  CALCIUM 8.6*  --  8.5*   < > 8.7* 8.7* 7.8* 8.2* 8.6*  --  8.6*  MG 1.9  --  2.4  --  2.2  --  2.1  --   --   --  2.1  PHOS  --   --   --   --   --   --  3.7  --   --   --   --    < > = values in this interval not displayed.    CBC: Recent Labs  Lab 08/27/22 0042 08/28/22 0455 08/29/22 0442 08/29/22 0749 08/30/22 0311  WBC 11.1* 12.2* 9.3 10.4 12.7*  NEUTROABS  --   --  7.9*  --   --   HGB 12.6* 12.1* 10.7* 11.6* 11.4*  HCT 36.4* 35.5* 31.5* 34.1* 33.7*  MCV 90.1 88.8 89.0 88.6 89.4  PLT 104* 108* 91* 95* 85*     Scheduled Meds:  bisacodyl  10 mg Rectal Once   divalproex  250 mg Oral BID   melatonin  5 mg Oral QHS   mirtazapine  15 mg Oral QHS   polyethylene glycol  17 g Oral BID   temazepam  30 mg Oral QHS   Continuous Infusions:  sodium chloride       Assessment & Plan:   Biventricular failure Recurrent VT End-stage cardiomyopathy Patient is presently on comfort care Discussed with patient and he understands there is nothing further to be offered to "cure" cure his heart Heart failure medications have been  discontinued patient is on comfort care medications only   Copied from previous notes, not addressed today:  Acute on chronic systolic CHF with cardiogenic shock Advanced heart failure team following -TTE 8/11: LVEF <20% with severely reduced RV -Diuresed with IV Lasix and milrinone, now off milrinone -10 L since admission and weight down by about 15 pounds. -Off GDMT due to shock and acute kidney injury.  - Not a candidate for advanced therapies per cardiology. -Initially plans to defer cath, subsequently had sustained V. tach, LHC 8/16 noted patent LIMA to LAD, SVG to PDA but occluded SVG to diagonal, normal filling pressures, medical management recommended -Poor prognosis with severe biventricular failure and recurrent ventricular tachycardia, felt to be end-stage cardiomyopathy, palliative care was recommended by advanced heart failure team and EP -Family meeting completed, he will discharge to residential hospice for comfort focused care   CAD with possible NSTEMI History of CAD s/p CABG Completed heparin x 48 hours. LHC showed patent LIMA-LAD and SVG-RCA, occluded SVG-diagonal.,  Medical management recommended -Now comfort care   Ventricular tachycardia -Recurrent episode 8/16, 10 minutes of sustained V. Tach -Initially treated with amiodarone and lidocaine -Now comfort care   Hyperglycemia -Resolved    Chronic right knee pain: Suspect osteoarthritis.  No history of trauma or gout.    Sick sinus syndrome S/p pacemaker    Remote history of A-fib   GERD: PPI.   Anemia and thrombocytopenia: Stable.   Insomnia: -Remeron   Nausea and vomiting: -Improved with supportive care, -On laxatives for constipation        DVT prophylaxis: Comfort care Code Status: DNR Family Communication: None today   Studies: No results found.  Principal Problem:   NSTEMI (non-ST elevated myocardial infarction) (HCC) Active Problems:   Acute on chronic systolic CHF  (congestive heart failure) (HCC)   AKI (acute kidney injury) (HCC)   History of atrial fibrillation   GERD (gastroesophageal reflux disease)     Grant Stevens, [facility]  If 7PM-7AM, please contact night-coverage [email or web address]   LOS: 12 days

## 2022-09-02 ENCOUNTER — Other Ambulatory Visit: Payer: Self-pay | Admitting: Adult Health

## 2022-09-02 DIAGNOSIS — I214 Non-ST elevation (NSTEMI) myocardial infarction: Secondary | ICD-10-CM | POA: Diagnosis not present

## 2022-09-02 DIAGNOSIS — E872 Acidosis, unspecified: Secondary | ICD-10-CM

## 2022-09-02 DIAGNOSIS — Z789 Other specified health status: Secondary | ICD-10-CM

## 2022-09-02 DIAGNOSIS — Z8679 Personal history of other diseases of the circulatory system: Secondary | ICD-10-CM | POA: Diagnosis not present

## 2022-09-02 DIAGNOSIS — K219 Gastro-esophageal reflux disease without esophagitis: Secondary | ICD-10-CM | POA: Diagnosis not present

## 2022-09-02 DIAGNOSIS — N179 Acute kidney failure, unspecified: Secondary | ICD-10-CM | POA: Diagnosis not present

## 2022-09-02 DIAGNOSIS — I959 Hypotension, unspecified: Secondary | ICD-10-CM | POA: Diagnosis not present

## 2022-09-02 MED ORDER — LOPERAMIDE HCL 2 MG PO CAPS: 2.0000 mg | ORAL_CAPSULE | ORAL | 0 refills | Status: DC | PRN

## 2022-09-02 MED ORDER — LORAZEPAM 2 MG/ML PO CONC: 0.6000 mg | Freq: Four times a day (QID) | ORAL | 0 refills | Status: DC | PRN

## 2022-09-02 MED ORDER — MORPHINE SULFATE (CONCENTRATE) 10 MG/0.5ML PO SOLN: 5.0000 mg | ORAL | 0 refills | Status: DC | PRN

## 2022-09-02 MED ORDER — LORAZEPAM 2 MG/ML PO CONC: 0.5000 mg | Freq: Four times a day (QID) | ORAL | 0 refills | Status: DC | PRN

## 2022-09-02 MED ORDER — TEMAZEPAM 15 MG PO CAPS: 15.0000 mg | ORAL_CAPSULE | Freq: Every evening | ORAL | 0 refills | Status: DC | PRN

## 2022-09-02 NOTE — TOC Progression Note (Signed)
Transition of Care [facility]) - Progression Note    Patient Details  Name: Grant Stevens MRN: [id number] Date of Birth: [birth year]  Transition [facility] Care [facility]) CM/SW Contact  Nicanor Bake Phone Number: [phone] 09/02/2022, 1:37 PM  Clinical Narrative:   HF CSW and CM met with the pt at bedside. HF CSW and CM called pts niece, [name] to discuss the options together. HF CSW/CM discussed the hospice options with pt and family and they decided to move forward with [facility]. Family and pt agreed to pay expenses.   CSW reviewed dc packet and left a copy of face sheet and med necessity form in packet and will contact [facility] once notified pt is ready for dc. TOC will continue following.      Expected Discharge Plan: Skilled Nursing Facility Barriers to Discharge: Continued Medical Work up  Expected Discharge Plan and Services In-house Referral: Clinical Social Work Discharge Planning Services: CM Consult Post Acute Care Choice: Skilled Nursing Facility Living arrangements for the past 2 months: Single Family Home Expected Discharge Date: 09/02/22                                     Social Determinants of Health (SDOH) Interventions SDOH Screenings   Food Insecurity: No Food Insecurity (08/24/2022)  Housing: Low Risk  (08/24/2022)  Transportation Needs: No Transportation Needs (08/24/2022)  Utilities: Not At Risk (08/24/2022)  Tobacco Use: Medium Risk (08/20/2022)    Readmission Risk Interventions     No data to display

## 2022-09-02 NOTE — Plan of Care (Signed)
  Problem: Pain Managment: Goal: General experience of comfort will improve Outcome: Progressing   

## 2022-09-02 NOTE — Discharge Summary (Signed)
Physician Discharge Summary   Patient: Grant Stevens MRN: [id number] DOB: 1940/08/15  Admit date:     08/20/2022  Discharge date: 09/02/22  Discharge Physician: Kathlen Mody   PCP: Carylon Perches, MD   Recommendations at discharge:  Please follow up with Hospice MD as recommended.   Discharge Diagnoses: Principal Problem:   NSTEMI (non-ST elevated myocardial infarction) (HCC) Active Problems:   Acute on chronic systolic CHF (congestive heart failure) (HCC)   AKI (acute kidney injury) (HCC)   History of atrial fibrillation   GERD (gastroesophageal reflux disease)    Hospital Course: 82 year old male with PMH of CAD, s/p CABG, chronic systolic CHF, s/p PPM, presented to ED on 08/20/2022 with complaints of diarrhea for 24 hours.  Also had intermittent chest pain with associated dyspnea and diaphoresis.  Hypotensive in the ED to 87/75 and hypoxic with oxygen saturation of 82%.  Admitted for acute on chronic systolic heart failure with cardiogenic shock, CAD with possible NSTEMI complicated by acute kidney injury.  Treatment was complicated by prolonged monomorphic VT and left heart cath noted occluded SVG to diagonal.  Repeat TTE showed EF less than 20% and severely reduced RV.  Patient was transitioned over to comfort care due to biventricular failure, VT manage no further advanced therapies available.  Assessment and Plan:    Biventricular failure Recurrent VT End-stage cardiomyopathy Patient is presently on comfort care Discussed with patient and he understands there is nothing further to be offered to "cure" cure his heart Heart failure medications have been discontinued patient is on comfort care medications only     Copied from previous notes, not addressed today:   Acute on chronic systolic CHF with cardiogenic shock Advanced heart failure team following -TTE 8/11: LVEF <20% with severely reduced RV -Diuresed with IV Lasix and milrinone, now off milrinone -10 L since  admission and weight down by about 15 pounds. -Off GDMT due to shock and acute kidney injury.  - Not a candidate for advanced therapies per cardiology. -Initially plans to defer cath, subsequently had sustained V. tach, LHC 8/16 noted patent LIMA to LAD, SVG to PDA but occluded SVG to diagonal, normal filling pressures, medical management recommended -Poor prognosis with severe biventricular failure and recurrent ventricular tachycardia, felt to be end-stage cardiomyopathy, palliative care was recommended by advanced heart failure team and EP -Family meeting completed, he will discharge to hospice .    CAD with possible NSTEMI History of CAD s/p CABG Completed heparin x 48 hours. LHC showed patent LIMA-LAD and SVG-RCA, occluded SVG-diagonal.,  Medical management recommended -Now comfort care   Ventricular tachycardia -Recurrent episode 8/16, 10 minutes of sustained V. Tach -Initially treated with amiodarone and lidocaine -Now comfort care   Hyperglycemia -Resolved    Chronic right knee pain: Suspect osteoarthritis.  No history of trauma or gout.    Sick sinus syndrome S/p pacemaker    Remote history of A-fib   GERD: PPI.   Anemia and thrombocytopenia: Stable.   Insomnia: -Remeron   Nausea and vomiting: -Improved with supportive care, -On laxatives for constipation          Consultants: heart failure team Palliative care.  Procedures performed:  Disposition: Hospice care Diet recommendation:  Discharge Diet Orders (From admission, onward)     Start     Ordered   09/02/22 0000  Diet - low sodium heart healthy        09/02/22 1036           Regular diet DISCHARGE  MEDICATION: Allergies as of 09/02/2022       Reactions   Spironolactone Other (See Comments)   Hyperkalemia        Medication List     STOP taking these medications    aspirin EC 81 MG tablet   atorvastatin 80 MG tablet Commonly known as: LIPITOR   bisoprolol 5 MG tablet Commonly  known as: ZEBETA   carbamazepine 200 MG tablet Commonly known as: TEGRETOL   empagliflozin 10 MG Tabs tablet Commonly known as: Jardiance   Entresto 24-26 MG Generic drug: sacubitril-valsartan   EPINEPHrine 0.3 mg/0.3 mL Soaj injection Commonly known as: EPI-PEN   furosemide 40 MG tablet Commonly known as: Lasix   LORazepam 0.5 MG tablet Commonly known as: ATIVAN Replaced by: LORazepam 2 MG/ML concentrated solution   pantoprazole 40 MG tablet Commonly known as: PROTONIX   silodosin 8 MG Caps capsule Commonly known as: RAPAFLO   SUPER B COMPLEX PO       TAKE these medications    divalproex 250 MG DR tablet Commonly known as: DEPAKOTE Take 250 mg by mouth 2 (two) times daily.   famotidine 40 MG tablet Commonly known as: PEPCID TAKE (1) TABLET BY MOUTH AT BEDTIME.   loperamide 2 MG capsule Commonly known as: IMODIUM Take 1 capsule (2 mg total) by mouth as needed for diarrhea or loose stools.   LORazepam 2 MG/ML concentrated solution Commonly known as: ATIVAN Place 0.5 mLs (1 mg total) under the tongue every 4 (four) hours as needed for anxiety. Replaces: LORazepam 0.5 MG tablet   melatonin 3 MG Tabs tablet Take 1 tablet (3 mg total) by mouth at bedtime.   morphine CONCENTRATE 10 MG/0.5ML Soln concentrated solution Place 0.25 mLs (5 mg total) under the tongue every 2 (two) hours as needed for moderate pain (or dyspnea).   temazepam 15 MG capsule Commonly known as: RESTORIL Take 1 capsule (15 mg total) by mouth at bedtime as needed for sleep.        Contact information for follow-up providers     Carylon Perches, MD Follow up in 6 day(s).   Specialty: Internal Medicine Why: Follow up hospital appointment- Friday, September 02, 2022 at 9:45 am  Please arrive to appointment 15 minutes early ! Contact information: [address] [place] [address] [phone]              Contact information for after-discharge care     Destination      [facility] Preferred SNF .   Service: Skilled Nursing Contact information: [address] [place] [address] [phone]                    Discharge Exam: Ceasar Mons Weights   08/28/22 0500 08/29/22 0500 08/30/22 0500  Weight: 92.3 kg 94 kg 94.1 kg   General exam: Appears calm and comfortable  Respiratory system: Clear to auscultation. Respiratory effort normal. Cardiovascular system: S1 & S2 heard, RRR.  Gastrointestinal system: Abdomen is nondistended, soft and nontender.  Central nervous system: Alert and comfortable.     Condition at discharge: fair  The results of significant diagnostics from this hospitalization (including imaging, microbiology, ancillary and laboratory) are listed below for reference.   Imaging Studies: DG CHEST PORT 1 VIEW  Result Date: 08/29/2022 CLINICAL DATA:  Shortness of breath. EXAM: PORTABLE CHEST 1 VIEW COMPARISON:  August 27, 2022. FINDINGS: The heart size and mediastinal contours are within normal limits. Status post coronary artery bypass  graft. Left-sided pacemaker is unchanged in position. Minimal bibasilar subsegmental atelectasis is noted. The visualized skeletal structures are unremarkable. IMPRESSION: Minimal bibasilar subsegmental atelectasis. Electronically Signed   By: Lupita Raider M.D.   On: 08/29/2022 10:15   DG CHEST PORT 1 VIEW  Result Date: 08/27/2022 CLINICAL DATA:  PICC line placement. EXAM: PORTABLE CHEST 1 VIEW COMPARISON:  08/20/2022 FINDINGS: Left-sided pacemaker unchanged. Sternotomy wires unchanged. Interval placement of right-sided PICC line with tip in the region of the cavoatrial junction. Lungs are hypoinflated with minimal linear left basilar density likely atelectasis. No effusion or pneumothorax. Cardiomediastinal silhouette and remainder of the exam is unchanged. IMPRESSION: 1. Hypoinflation with minimal linear left basilar density likely atelectasis. 2. Right-sided PICC  line with tip in the region of the cavoatrial junction. Electronically Signed   By: Elberta Fortis M.D.   On: 08/27/2022 14:07   [place] EKG SITE RITE  Result Date: 08/27/2022 If Site Rite image not attached, placement could not be confirmed due to current cardiac rhythm.  CARDIAC CATHETERIZATION  Result Date: 08/26/2022   Ost LAD to Prox LAD lesion is 100% stenosed.   Prox LAD to Mid LAD lesion is 90% stenosed.   Ost RCA to Dist RCA lesion is 100% stenosed.   Origin to Prox Graft lesion is 100% stenosed.   LIMA graft was visualized by angiography and is large.   SVG graft was visualized by angiography.   SVG graft was visualized by angiography and is normal in caliber.   The graft exhibits no disease.   The graft exhibits no disease.   LV end diastolic pressure is normal. Severe 2 vessel occlusive CAD. The left main and LCx are widely patent. Patent LIMA to the LAD. This does supply some flow into the first diagonal Occluded SVG to the diagonal Patent SVG to the right PDA Normal LV filling pressures. PCWP 12/14 mean 11 mm Hg. LVEDP 16 mm Hg Normal right  heart pressures. PAP 28/13 mean 21 mm Hg Cardiac output 5.08 L/min, index 2.37 Plan: medical management.   ECHOCARDIOGRAM COMPLETE  Result Date: 08/21/2022    ECHOCARDIOGRAM REPORT   Patient Name:   Grant Stevens Date of Exam: 08/21/2022 Medical Rec #:  [id number]        Height:       72.0 in Accession #:    [id number]       Weight:       214.7 lb Date of Birth:  [birth year]         BSA:          2.196 m Patient Age:    82 years         BP:           90/71 mmHg Patient Gender: M                HR:           60 bpm. Exam Location:  Inpatient Procedure: 2D Echo, Cardiac Doppler, Color Doppler and Intracardiac            Opacification Agent STAT ECHO REPORT CONTAINS CRITICAL RESULT Indications:    I50.40* Unspecified combined systolic (congestive) and diastolic                 (congestive) heart failure  History:        Patient has prior history of Echocardiogram  examinations, most                 recent  03/03/2022. CHF, CAD, Previous Myocardial Infarction and                 Acute MI, Abnormal ECG, Prior CABG and Pacemaker,                 Arrythmias:Heart block and Atrial Fibrillation,                 Signs/Symptoms:Syncope, Dizziness/Lightheadedness and                 Hypotension; Risk Factors:Former Smoker and Sleep Apnea.  Sonographer:    Sheralyn Boatman RDCS Referring Phys: [id number] [facility] A CHANDRASEKHAR IMPRESSIONS  1. Left ventricular ejection fraction, by estimation, is <20%. The left ventricle has severely decreased function. The left ventricle demonstrates regional wall motion abnormalities (see scoring diagram/findings for description). The left ventricular internal cavity size was mildly to moderately dilated. Left ventricular diastolic parameters are consistent with Grade III diastolic dysfunction (restrictive).  2. Right ventricular systolic function is severely reduced. The right ventricular size is mildly enlarged. There is mildly elevated pulmonary artery systolic pressure. The estimated right ventricular systolic pressure is 38.9 mmHg.  3. The mitral valve is degenerative. Trivial mitral valve regurgitation. No evidence of mitral stenosis.  4. The aortic valve is tricuspid. There is moderate calcification of the aortic valve. Aortic valve regurgitation is not visualized. Aortic valve sclerosis is present, with no evidence of aortic valve stenosis.  5. The inferior vena cava is dilated in size with >50% respiratory variability, suggesting right atrial pressure of 8 mmHg. Comparison(s): Prior images reviewed side by side. LVEF is further decreased. No LV thrombus noted. FINDINGS  Left Ventricle: Left ventricular ejection fraction, by estimation, is <20%. The left ventricle has severely decreased function. The left ventricle demonstrates regional wall motion abnormalities. Definity contrast agent was given IV to delineate the left ventricular endocardial borders.  The left ventricular internal cavity size was mildly to moderately dilated. There is no left ventricular hypertrophy. Left ventricular diastolic parameters are consistent with Grade III diastolic dysfunction (restrictive).  LV Wall Scoring: The apical lateral segment, apical septal segment, apical anterior segment, and apical inferior segment are aneurysmal. The mid anteroseptal segment, mid inferolateral segment, mid anterolateral segment, mid inferoseptal segment, mid anterior segment, and mid inferior segment are akinetic. The basal anteroseptal segment, basal inferolateral segment, basal anterolateral segment, basal anterior segment, basal inferior segment, and basal inferoseptal segment are hypokinetic. Right Ventricle: The right ventricular size is mildly enlarged. No increase in right ventricular wall thickness. Right ventricular systolic function is severely reduced. There is mildly elevated pulmonary artery systolic pressure. The tricuspid regurgitant velocity is 2.78 m/s, and with an assumed right atrial pressure of 8 mmHg, the estimated right ventricular systolic pressure is 38.9 mmHg. Left Atrium: Left atrial size was normal in size. Right Atrium: Right atrial size was normal in size. Pericardium: There is no evidence of pericardial effusion. Mitral Valve: The mitral valve is degenerative in appearance. Trivial mitral valve regurgitation. No evidence of mitral valve stenosis. Tricuspid Valve: The tricuspid valve is grossly normal. Tricuspid valve regurgitation is not demonstrated. No evidence of tricuspid stenosis. Aortic Valve: The aortic valve is tricuspid. There is moderate calcification of the aortic valve. Aortic valve regurgitation is not visualized. Aortic valve sclerosis is present, with no evidence of aortic valve stenosis. Pulmonic Valve: The pulmonic valve was normal in structure. Pulmonic valve regurgitation is not visualized. No evidence of pulmonic stenosis. Aorta: The aortic root and  ascending aorta are structurally normal, with  no evidence of dilitation. Venous: The inferior vena cava is dilated in size with greater than 50% respiratory variability, suggesting right atrial pressure of 8 mmHg. IAS/Shunts: No atrial level shunt detected by color flow Doppler.  LEFT VENTRICLE PLAX 2D LVIDd:         5.90 cm      Diastology LVIDs:         5.60 cm      LV e' medial:    3.16 cm/s LV PW:         1.80 cm      LV E/e' medial:  18.7 LV IVS:        1.20 cm      LV e' lateral:   8.11 cm/s LVOT diam:     2.40 cm      LV E/e' lateral: 7.3 LV SV:         43 LV SV Index:   19 LVOT Area:     4.52 cm  LV Volumes (MOD) LV vol d, MOD A2C: 165.0 ml LV vol d, MOD A4C: 207.0 ml LV vol s, MOD A2C: 152.0 ml LV vol s, MOD A4C: 191.0 ml LV SV MOD A2C:     13.0 ml LV SV MOD A4C:     207.0 ml LV SV MOD BP:      15.0 ml RIGHT VENTRICLE            IVC RV S prime:     2.61 cm/s  IVC diam: 3.30 cm TAPSE (M-mode): 0.6 cm LEFT ATRIUM             Index        RIGHT ATRIUM           Index LA diam:        4.90 cm 2.23 cm/m   RA Area:     20.20 cm LA Vol (A2C):   44.7 ml 20.36 ml/m  RA Volume:   64.90 ml  29.56 ml/m LA Vol (A4C):   68.8 ml 31.33 ml/m LA Biplane Vol: 57.7 ml 26.28 ml/m  AORTIC VALVE LVOT Vmax:   70.30 cm/s LVOT Vmean:  44.800 cm/s LVOT VTI:    0.094 m  AORTA Ao Root diam: 3.60 cm Ao Asc diam:  3.80 cm MITRAL VALVE               TRICUSPID VALVE MV Area (PHT): 4.31 cm    TR Peak grad:   30.9 mmHg MV Decel Time: 176 msec    TR Vmax:        278.00 cm/s MV E velocity: 59.20 cm/s MV A velocity: 44.50 cm/s  SHUNTS MV E/A ratio:  1.33        Systemic VTI:  0.09 m                            Systemic Diam: 2.40 cm Riley Lam MD Electronically signed by Riley Lam MD Signature Date/Time: 08/21/2022/8:54:00 AM    Final    DG CHEST PORT 1 VIEW  Result Date: 08/20/2022 CLINICAL DATA:  Check central line placement EXAM: PORTABLE CHEST 1 VIEW COMPARISON:  Film from earlier in the same day. FINDINGS:  Left jugular central line is noted with the catheter tip at the cavoatrial junction. No pneumothorax is noted. Cardiac shadow is stable. Pacing device and postsurgical changes are again seen. The lungs are clear bilaterally. IMPRESSION: No pneumothorax following central line placement. No acute abnormality  noted. Electronically Signed   By: Alcide Clever M.D.   On: 08/20/2022 20:16   US RENAL  Result Date: 08/20/2022 CLINICAL DATA:  Acute renal injury EXAM: RENAL / URINARY TRACT ULTRASOUND COMPLETE COMPARISON:  None Available. FINDINGS: Right Kidney: Renal measurements: 11.7 x 4.5 x 4.6 cm. = volume: 125 mL. Echogenicity within normal limits. No mass or hydronephrosis visualized. Left Kidney: Renal measurements: 12.1 x 5.2 x 4.5 cm. = volume: 148 mL. Echogenicity within normal limits. No mass or hydronephrosis visualized. Bladder: Decompressed Other: None. IMPRESSION: Unremarkable kidneys.  No obstructive changes are seen. Electronically Signed   By: Alcide Clever M.D.   On: 08/20/2022 19:59   DG Chest Port 1 View  Result Date: 08/20/2022 CLINICAL DATA:  82 year old male with history of chest pain. Diarrhea for the past 3 days. EXAM: PORTABLE CHEST 1 VIEW COMPARISON:  Chest x-ray 07/15/2021. FINDINGS: Lung volumes are slightly low. No consolidative airspace disease. No pleural effusions. No pneumothorax. No pulmonary nodule or mass noted. Pulmonary vasculature and the cardiomediastinal silhouette are within normal limits. Atherosclerotic calcifications in the thoracic aorta. Left-sided pacemaker device in place with lead tips projecting over the expected location of the right atrium and right ventricle. Status post median sternotomy for CABG. IMPRESSION: 1. No radiographic evidence of acute cardiopulmonary disease. 2. Aortic atherosclerosis. Electronically Signed   By: Trudie Reed M.D.   On: 08/20/2022 12:22   CUP PACEART REMOTE DEVICE CHECK  Result Date: 08/10/2022 Scheduled remote reviewed. Normal  device function.  Next remote 91 days. LA, CVRS   Microbiology: Results for orders placed or performed during the hospital encounter of 08/20/22  Culture, blood (routine x 2)     Status: None   Collection Time: 08/20/22 12:24 PM   Specimen: Right Antecubital; Blood  Result Value Ref Range Status   Specimen Description   Final    RIGHT ANTECUBITAL BOTTLES DRAWN AEROBIC AND ANAEROBIC   Special Requests Blood Culture adequate volume  Final   Culture   Final    NO GROWTH 5 DAYS Performed at [facility], [address] [place], [place] [address]    Report Status 08/25/2022 FINAL  Final  Culture, blood (routine x 2)     Status: None   Collection Time: 08/20/22 12:24 PM   Specimen: Left Antecubital; Blood  Result Value Ref Range Status   Specimen Description   Final    LEFT ANTECUBITAL BOTTLES DRAWN AEROBIC AND ANAEROBIC   Special Requests Blood Culture adequate volume  Final   Culture   Final    NO GROWTH 5 DAYS Performed at [facility], [address] [place], [place] [address]    Report Status 08/25/2022 FINAL  Final  MRSA Next Gen by PCR, Nasal     Status: None   Collection Time: 08/20/22  4:01 PM   Specimen: Nasal Mucosa; Nasal Swab  Result Value Ref Range Status   MRSA by PCR Next Gen NOT DETECTED NOT DETECTED Final    Comment: (NOTE) The GeneXpert MRSA Assay (FDA approved for NASAL specimens only), is one component of a comprehensive MRSA colonization surveillance program. It is not intended to diagnose MRSA infection nor to guide or monitor treatment for MRSA infections. Test performance is not FDA approved in patients less than 70 years old. Performed at [facility], [address] [place], [place] [address]   Respiratory (~20 pathogens) panel by PCR     Status: None   Collection Time: 08/22/22  7:38 AM  Specimen: Nasopharyngeal Swab; Respiratory  Result Value Ref Range Status   Adenovirus NOT DETECTED NOT DETECTED Final   Coronavirus 229E NOT DETECTED NOT  DETECTED Final    Comment: (NOTE) The Coronavirus on the Respiratory Panel, DOES NOT test for the novel  Coronavirus (2019 nCoV)    Coronavirus HKU1 NOT DETECTED NOT DETECTED Final   Coronavirus NL63 NOT DETECTED NOT DETECTED Final   Coronavirus OC43 NOT DETECTED NOT DETECTED Final   Metapneumovirus NOT DETECTED NOT DETECTED Final   Rhinovirus / Enterovirus NOT DETECTED NOT DETECTED Final   Influenza A NOT DETECTED NOT DETECTED Final   Influenza B NOT DETECTED NOT DETECTED Final   Parainfluenza Virus 1 NOT DETECTED NOT DETECTED Final   Parainfluenza Virus 2 NOT DETECTED NOT DETECTED Final   Parainfluenza Virus 3 NOT DETECTED NOT DETECTED Final   Parainfluenza Virus 4 NOT DETECTED NOT DETECTED Final   Respiratory Syncytial Virus NOT DETECTED NOT DETECTED Final   Bordetella pertussis NOT DETECTED NOT DETECTED Final   Bordetella Parapertussis NOT DETECTED NOT DETECTED Final   Chlamydophila pneumoniae NOT DETECTED NOT DETECTED Final   Mycoplasma pneumoniae NOT DETECTED NOT DETECTED Final    Comment: Performed at [facility], [address] [place], [place] [address]  SARS Coronavirus 2 by RT PCR (hospital order, performed in [facility] hospital lab) *cepheid single result test* Anterior Nasal Swab     Status: None   Collection Time: 08/22/22  7:38 AM   Specimen: Anterior Nasal Swab  Result Value Ref Range Status   SARS Coronavirus 2 by RT PCR NEGATIVE NEGATIVE Final    Comment: Performed at [facility], [address] [place], [place] [address]    Labs: CBC: Recent Labs  Lab 08/27/22 [phone] 08/28/22 0455 08/29/22 0442 08/29/22 0749 08/30/22 0311  WBC 11.1* 12.2* 9.3 10.4 12.7*  NEUTROABS  --   --  7.9*  --   --   HGB 12.6* 12.1* 10.7* 11.6* 11.4*  HCT 36.4* 35.5* 31.5* 34.1* 33.7*  MCV 90.1 88.8 89.0 88.6 89.4  PLT 104* 108* 91* 95* 85*   Basic Metabolic Panel: Recent Labs  Lab 08/27/22 0042 08/27/22 0936 08/28/22 0455 08/28/22 1700 08/29/22 0442  08/29/22 0749 08/29/22 1002 08/29/22 2123 08/30/22 0311  NA 121*   < > 124* 122* 117* 120* 121* 117* 123*  K 4.4   < > 3.8 4.8 3.6 3.6 3.8  --  4.6  CL 80*   < > 81* 80* 76* 77* 81*  --  83*  CO2 27   < > 29 27 27 26 28   --  25  GLUCOSE 86   < > 98 124* 359* 368* 97  --  102*  BUN 72*   < > 72* 75* 66* 66* 70*  --  73*  CREATININE 1.82*   < > 2.12* 2.30* 2.30* 2.35* 2.34*  --  2.56*  CALCIUM 8.5*   < > 8.7* 8.7* 7.8* 8.2* 8.6*  --  8.6*  MG 2.4  --  2.2  --  2.1  --   --   --  2.1  PHOS  --   --   --   --  3.7  --   --   --   --    < > = values in this interval not displayed.   Liver Function Tests: Recent Labs  Lab 08/27/22 0936 08/28/22 0950 08/29/22 0442  AST 22 20 19   ALT 29 23 19  ALKPHOS 96 84 77  BILITOT 0.7 0.4 0.5  PROT 6.9 6.2* 5.6*  ALBUMIN 3.6 3.1* 2.9*   CBG: Recent Labs  Lab 08/29/22 1120 08/29/22 1559 08/29/22 2141 08/30/22 0611 08/30/22 1140  GLUCAP 110* 106* 120* 100* 114*    Discharge time spent: 35 minutes.   Signed: Kathlen Mody, MD [facility] 09/02/2022

## 2022-09-02 NOTE — TOC Progression Note (Signed)
Transition of Care [facility]) - Progression Note    Patient Details  Name: Grant Stevens MRN: [id number] Date of Birth: 23-Sep-1940  Transition of Care [facility]) CM/SW Contact  Elliot Cousin, RN Phone Number: [phone] 09/02/2022, 2:49 PM  Clinical Narrative:   CM reviewed chart and [facility] did not accept for IP Hospice, pt not appropriate at this time. Contacted pt's niece, [name] and she had questions about options. Reviewed with niece pt's only option if they are not able to care for patient or hire caregiver in the home is SNF with Hospice which they will have a cost approximately $7000 at faciltiy. Niece wanted to speak to patient and feels he is able to financial cover without a hardship. Pt gave permission to niece to access his funds to pay the $7000 for facility cost. Provided niece with [facility] rep, Lyla Son. States she has worked with Lyla Son in the past with a previous family member. Attending updated and plan is dc [facility] with Hospice.     Expected Discharge Plan: Skilled Nursing Facility Barriers to Discharge: No Barriers Identified  Expected Discharge Plan and Services In-house Referral: Clinical Social Work Discharge Planning Services: CM Consult Post Acute Care Choice: Skilled Nursing Facility Living arrangements for the past 2 months: Single Family Home Expected Discharge Date: 09/02/22                                     Social Determinants of Health (SDOH) Interventions SDOH Screenings   Food Insecurity: No Food Insecurity (08/24/2022)  Housing: Low Risk  (08/24/2022)  Transportation Needs: No Transportation Needs (08/24/2022)  Utilities: Not At Risk (08/24/2022)  Tobacco Use: Medium Risk (08/20/2022)    Readmission Risk Interventions     No data to display

## 2022-09-02 NOTE — Progress Notes (Signed)
Patient c/o pain 8/10. 5mg  morphine given. 0.2ml wasted from syringe with Steffanie Dunn RN

## 2022-09-02 NOTE — Progress Notes (Signed)
Report called to [facility]. Discharge papers in packet. Awaiting PTAR for transport.

## 2022-09-02 NOTE — TOC Transition Note (Signed)
Transition of Care [facility]) - CM/SW Discharge Note   Patient Details  Name: Grant Stevens MRN: [id number] Date of Birth: [birth year]  Transition of Care [facility]) CM/SW Contact:  Nicanor Bake Phone Number: [phone] 09/02/2022, 2:30 PM   Clinical Narrative:  HF CSW called and spoke with [facility] who will setup hospice services for the pt at [facility]. HF CSW called and spoke with [facility] with report information- [phone], nurse station, room# 125. CSW called and notified pts family. Pt is ready for dc.      Final next level of care: Hospice Medical Facility Barriers to Discharge: No Barriers Identified   Patient Goals and CMS Choice CMS Medicare.gov Compare Post Acute Care list provided to:: Patient Choice offered to / list presented to : Patient  Discharge Placement                         Discharge Plan and Services Additional resources added to the After Visit Summary for   In-house Referral: Clinical Social Work Discharge Planning Services: CM Consult Post Acute Care Choice: Skilled Nursing Facility                               Social Determinants of Health (SDOH) Interventions SDOH Screenings   Food Insecurity: No Food Insecurity (08/24/2022)  Housing: Low Risk  (08/24/2022)  Transportation Needs: No Transportation Needs (08/24/2022)  Utilities: Not At Risk (08/24/2022)  Tobacco Use: Medium Risk (08/20/2022)     Readmission Risk Interventions     No data to display

## 2022-09-02 NOTE — Plan of Care (Signed)
Problem: Education: Goal: Knowledge of General Education information will improve Description: Including pain rating scale, medication(s)/side effects and non-pharmacologic comfort measures Outcome: Adequate for Discharge   Problem: Health Behavior/Discharge Planning: Goal: Ability to manage health-related needs will improve Outcome: Adequate for Discharge   Problem: Clinical Measurements: Goal: Ability to maintain clinical measurements within normal limits will improve Outcome: Adequate for Discharge Goal: Will remain free from infection Outcome: Adequate for Discharge Goal: Diagnostic test results will improve Outcome: Adequate for Discharge Goal: Respiratory complications will improve Outcome: Adequate for Discharge Goal: Cardiovascular complication will be avoided Outcome: Adequate for Discharge   Problem: Activity: Goal: Risk for activity intolerance will decrease Outcome: Adequate for Discharge   Problem: Coping: Goal: Level of anxiety will decrease Outcome: Adequate for Discharge   Problem: Elimination: Goal: Will not experience complications related to bowel motility Outcome: Adequate for Discharge Goal: Will not experience complications related to urinary retention Outcome: Adequate for Discharge   Problem: Pain Managment: Goal: General experience of comfort will improve Outcome: Adequate for Discharge   Problem: Safety: Goal: Ability to remain free from injury will improve Outcome: Adequate for Discharge   Problem: Skin Integrity: Goal: Risk for impaired skin integrity will decrease Outcome: Adequate for Discharge   Problem: Education: Goal: Understanding of CV disease, CV risk reduction, and recovery process will improve Outcome: Adequate for Discharge Goal: Individualized Educational Video(s) Outcome: Adequate for Discharge   Problem: Activity: Goal: Ability to return to baseline activity level will improve Outcome: Adequate for Discharge    Problem: Cardiovascular: Goal: Ability to achieve and maintain adequate cardiovascular perfusion will improve Outcome: Adequate for Discharge Goal: Vascular access site(s) Level 0-1 will be maintained Outcome: Adequate for Discharge   Problem: Health Behavior/Discharge Planning: Goal: Ability to safely manage health-related needs after discharge will improve Outcome: Adequate for Discharge   Problem: Education: Goal: Ability to describe self-care measures that may prevent or decrease complications (Diabetes Survival Skills Education) will improve Outcome: Adequate for Discharge Goal: Individualized Educational Video(s) Outcome: Adequate for Discharge   Problem: Coping: Goal: Ability to adjust to condition or change in health will improve Outcome: Adequate for Discharge   Problem: Fluid Volume: Goal: Ability to maintain a balanced intake and output will improve Outcome: Adequate for Discharge   Problem: Health Behavior/Discharge Planning: Goal: Ability to identify and utilize available resources and services will improve Outcome: Adequate for Discharge Goal: Ability to manage health-related needs will improve Outcome: Adequate for Discharge   Problem: Metabolic: Goal: Ability to maintain appropriate glucose levels will improve Outcome: Adequate for Discharge   Problem: Nutritional: Goal: Maintenance of adequate nutrition will improve Outcome: Adequate for Discharge Goal: Progress toward achieving an optimal weight will improve Outcome: Adequate for Discharge   Problem: Skin Integrity: Goal: Risk for impaired skin integrity will decrease Outcome: Adequate for Discharge   Problem: Tissue Perfusion: Goal: Adequacy of tissue perfusion will improve Outcome: Adequate for Discharge   Problem: Education: Goal: Knowledge of the prescribed therapeutic regimen will improve Outcome: Adequate for Discharge   Problem: Coping: Goal: Ability to identify and develop effective  coping behavior will improve Outcome: Adequate for Discharge   Problem: Clinical Measurements: Goal: Quality of life will improve Outcome: Adequate for Discharge   Problem: Respiratory: Goal: Verbalizations of increased ease of respirations will increase Outcome: Adequate for Discharge   Problem: Role Relationship: Goal: Family's ability to cope with current situation will improve Outcome: Adequate for Discharge Goal: Ability to verbalize concerns, feelings, and thoughts to partner or family member will  improve Outcome: Adequate for Discharge   Problem: Pain Management: Goal: Satisfaction with pain management regimen will improve Outcome: Adequate for Discharge

## 2022-09-02 NOTE — Progress Notes (Signed)
Daily Progress Note   Patient Name: Grant Stevens       Date: 09/02/2022 DOB: 1940/10/07  Age: 82 y.o. MRN#: [id number] Attending Physician: Kathlen Mody, MD Primary Care Physician: Carylon Perches, MD Admit Date: 08/20/2022  Reason for Consultation/Follow-up: Non pain symptom management, Pain control, Psychosocial/spiritual support, and Terminal Care  Subjective: I have reviewed medical records including EPIC notes, MAR, and labs. Received report from primary RN - no acute concerns. Per RN, patient stated he "felt good today" and "slept well." RN reports he ate about half his breakfast.  Micah Flesher to visit patient at bedside - no family/visitors present. Patient was lying in bed asleep - I did not attempt to wake him to preserve comfort. No signs or non-verbal gestures of pain or discomfort noted. No respiratory distress, increased work of breathing, or secretions noted.   Discharge orders are in. TOC discussing disposition options with family.  Length of Stay: 13  Current Medications: Scheduled Meds:   bisacodyl  10 mg Rectal Once   divalproex  250 mg Oral BID   melatonin  5 mg Oral QHS   mirtazapine  15 mg Oral QHS   polyethylene glycol  17 g Oral BID   temazepam  30 mg Oral QHS    Continuous Infusions:  sodium chloride      PRN Meds: sodium chloride, acetaminophen, antiseptic oral rinse, glycopyrrolate **OR** glycopyrrolate **OR** glycopyrrolate, HYDROmorphone (DILAUDID) injection, hydrOXYzine, loperamide, LORazepam **OR** LORazepam **OR** LORazepam, morphine CONCENTRATE **OR** morphine CONCENTRATE, ondansetron **OR** ondansetron (ZOFRAN) IV, mouth rinse, polyvinyl alcohol  Physical Exam Vitals and nursing note reviewed.  Constitutional:      General: He is not in acute  distress. Pulmonary:     Effort: No respiratory distress.  Skin:    General: Skin is warm and dry.  Neurological:     Comments: asleep             Vital Signs: BP 108/66 (BP Location: Left Arm)   Pulse 80   Temp 97.8 F (36.6 C) (Oral)   Resp 17   Ht 6' (1.829 m)   Wt 94.1 kg   SpO2 92%   BMI 28.14 kg/m  SpO2: SpO2: 92 % O2 Device: O2 Device: Room Air O2 Flow Rate: O2 Flow Rate (L/min): 2 L/min  Intake/output summary:  Intake/Output Summary (Last 24  hours) at 09/02/2022 1041 Last data filed at 09/01/2022 1300 Gross per 24 hour  Intake 240 ml  Output 400 ml  Net -160 ml   LBM: Last BM Date : 09/01/22 Baseline Weight: Weight: 98 kg Most recent weight: Weight: 94.1 kg       Palliative Assessment/Data: PPS 40%      Patient Active Problem List   Diagnosis Date Noted   Acute on chronic systolic CHF (congestive heart failure) (HCC) 08/23/2022   Cardiogenic shock (HCC) 08/22/2022   NSTEMI (non-ST elevated myocardial infarction) (HCC) 08/20/2022   Hypotension 08/20/2022   Acute metabolic encephalopathy 07/15/2021   Dehydration 07/15/2021   Fall at home, initial encounter 07/15/2021   Tick bite 07/15/2021   Hoarseness of voice 07/08/2021   Dysphagia 07/08/2021   AKI (acute kidney injury) (HCC) 07/02/2021   Constipation 02/18/2021   Intermittent diarrhea 08/17/2020   Barrett's esophagus without dysplasia 08/17/2020   GERD (gastroesophageal reflux disease) 08/17/2020   Heart block AV complete (HCC) 05/12/2020   CAD (coronary artery disease) of artery bypass graft 05/12/2020   Syncope and collapse 05/12/2020   BPH (benign prostatic hyperplasia) 01/26/2015   Dizziness 10/19/2014   Coronary atherosclerosis of native coronary artery    History of atrial fibrillation    Essential hypertension, benign 09/22/2010   Mixed hyperlipidemia 11/28/2008   SLEEP APNEA 11/28/2008    Palliative Care Assessment & Plan   Patient Profile: 82 year old male with PMH of CAD, s/p  CABG, chronic systolic CHF, s/p PPM. The Palliative care team has been asked to get involved in the setting of Trevar's end stage heart failure for further goals of care conversations.   Assessment: Principal Problem:   NSTEMI (non-ST elevated myocardial infarction) (HCC) Active Problems:   History of atrial fibrillation   GERD (gastroesophageal reflux disease)   AKI (acute kidney injury) (HCC)   Acute on chronic systolic CHF (congestive heart failure) (HCC)   Terminal care  Recommendations/Plan: Continue full comfort measures Continue DNR/DNI as previously documented - copy of durable DNR form was made and will be scanned into Vynca/ACP tab TOC discussing disposition with family - considering LTC with hospice Continue current comfort focused medication regimen PMT will continue to follow and support holistically  Symptom Management Dilaudid and morphine concentrate PRN pain/dyspnea/increased work of breathing/RR>25 Tylenol PRN pain/fever Biotin PRN dry mouth Robinul PRN secretions Haldol PRN agitation/delirium Ativan PRN anxiety/seizure/sleep/distress Atarax TID PRN Zofran PRN nausea/vomiting Liquifilm Tears PRN dry eye Restoril, melatonin, and mirtazapine at bedtime   Goals of Care and Additional Recommendations: Limitations on Scope of Treatment: Full Comfort Care  Code Status:    Code Status Orders  (From admission, onward)           Start     Ordered   08/30/22 0959  Do not attempt resuscitation (DNR)  Continuous       Question Answer Comment  If patient has no pulse and is not breathing Do Not Attempt Resuscitation   If patient has a pulse and/or is breathing: Medical Treatment Goals COMFORT MEASURES: Keep clean/warm/dry, use medication by any route; positioning, wound care and other measures to relieve pain/suffering; use oxygen, suction/manual treatment of airway obstruction for comfort; do not transfer unless for comfort needs.   Consent: Discussion  documented in EHR or advanced directives reviewed      08/30/22 1009           Code Status History     Date Active Date Inactive Code Status Order ID Comments User  Context   08/27/2022 2254 08/30/2022 1009 DNR [id number]  Tereasa Coop, MD Inpatient   08/22/2022 0759 08/27/2022 2254 DNR [id number]  Duayne Cal, NP Inpatient   08/20/2022 1627 08/22/2022 0759 Full Code [id number]  Coralyn Helling, MD Inpatient   07/15/2021 2202 07/17/2021 0001 Full Code [id number]  Zierle-Ghosh, [place] B, DO Inpatient   07/02/2021 1826 07/03/2021 1827 Full Code [id number]  Maurilio Lovely D, DO ED   05/12/2020 1651 05/13/2020 1902 Full Code [id number]  Hillis Range, MD Inpatient   01/26/2015 1609 01/27/2015 1433 Full Code [id number]  Malen Gauze, MD Inpatient   10/19/2014 1952 10/21/2014 1411 Full Code [id number]  Wilson Singer, MD Inpatient       Prognosis:  < 6 months  Discharge Planning: Skilled Nursing Facility with Hospice  Care plan was discussed with primary RN, Dr. Blake Divine, [facility]  Thank you for allowing the Palliative Medicine Team to assist in the care of this patient.  Haskel Khan, NP  Please contact Palliative Medicine Team phone at [phone] for questions and concerns.   *Portions of this note are a verbal dictation therefore any spelling and/or grammatical errors are due to the "Dragon Medical One" system interpretation.

## 2022-09-05 ENCOUNTER — Encounter: Payer: Self-pay | Admitting: Adult Health

## 2022-09-05 ENCOUNTER — Non-Acute Institutional Stay: Payer: Self-pay | Admitting: Adult Health

## 2022-09-05 DIAGNOSIS — N1831 Chronic kidney disease, stage 3a: Secondary | ICD-10-CM | POA: Diagnosis not present

## 2022-09-05 DIAGNOSIS — I5082 Biventricular heart failure: Secondary | ICD-10-CM | POA: Diagnosis not present

## 2022-09-05 DIAGNOSIS — I5023 Acute on chronic systolic (congestive) heart failure: Secondary | ICD-10-CM | POA: Diagnosis not present

## 2022-09-05 DIAGNOSIS — I482 Chronic atrial fibrillation, unspecified: Secondary | ICD-10-CM | POA: Insufficient documentation

## 2022-09-05 DIAGNOSIS — F319 Bipolar disorder, unspecified: Secondary | ICD-10-CM | POA: Insufficient documentation

## 2022-09-05 DIAGNOSIS — N179 Acute kidney failure, unspecified: Secondary | ICD-10-CM

## 2022-09-05 DIAGNOSIS — I700 Atherosclerosis of aorta: Secondary | ICD-10-CM | POA: Insufficient documentation

## 2022-09-05 DIAGNOSIS — E43 Unspecified severe protein-calorie malnutrition: Secondary | ICD-10-CM | POA: Diagnosis not present

## 2022-09-05 DIAGNOSIS — I130 Hypertensive heart and chronic kidney disease with heart failure and stage 1 through stage 4 chronic kidney disease, or unspecified chronic kidney disease: Secondary | ICD-10-CM

## 2022-09-05 DIAGNOSIS — I429 Cardiomyopathy, unspecified: Secondary | ICD-10-CM | POA: Diagnosis not present

## 2022-09-05 DIAGNOSIS — E782 Mixed hyperlipidemia: Secondary | ICD-10-CM

## 2022-09-05 DIAGNOSIS — D696 Thrombocytopenia, unspecified: Secondary | ICD-10-CM

## 2022-09-05 DIAGNOSIS — E46 Unspecified protein-calorie malnutrition: Secondary | ICD-10-CM | POA: Insufficient documentation

## 2022-09-05 NOTE — Progress Notes (Signed)
Location:  [facility] Room Number: 125 Place of Service:  SNF (31)   CODE STATUS: dnr   Allergies  Allergen Reactions   Spironolactone Other (See Comments)    Hyperkalemia    Chief Complaint  Patient presents with   Hospitalization Follow-up    HPI:  He is a 82 year old man who has been hospitalized from 08-20-22 through 09-02-22. His medical history of CAD s/p cabg; chronic systolic CHF; with pacemaker. He presented to the ED after having diarrhea for 24 hours. He had been having intermittent chest pain with shortness of breath and diaphoresis. He was found to have a low blood pressure of 87/75 and hypoxic at 82%. He was admitted for acute on chronic systolic heart failure with cardiogenic shock; CAD with possible NSTEMI. His stay was complicated by acute on chronic renal failure. His treatment was complicated by prolonged monomorphic VT. His left heart cath demonstrated occluded SVC to diagonal. His EF was 20% with severely reduced RV. He was transferred to comfort care due to his biventricular failure.  He is wanting to go home. This will not be possible; as his family is unable to care for him. His family desires comfort care with hospice. We have discussed what comfort care means and the benefits of hospice care. This does represent a long term placement. He will continue to be followed for his chronic illnesses.     Past Medical History:  Diagnosis Date   Allergic rhinitis    Arthritis    Asthma    Childhood   Atrial fibrillation (HCC)    Remote history - WARCEF   Bladder cancer (HCC)    BPH (benign prostatic hyperplasia)    Cardiomyopathy (HCC)    CHF (congestive heart failure) (HCC)    a. EF 30-35% by echo in 2018 and 10/2018, at 25-30% in 05/2020   Coronary atherosclerosis of native coronary artery    Multivessel s/p CABG in 2002 post-IMI, LVEF 35% up to 50% postoperatively;   Depression    Difficulty sleeping    Erectile dysfunction     Essential hypertension    Frequency of urination    History of bipolar disorder    Hyperlipidemia    IBS (irritable bowel syndrome)    Myocardial infarction (HCC) 2002    Past Surgical History:  Procedure Laterality Date   BIOPSY  03/28/2018   Procedure: BIOPSY;  Surgeon: Malissa Hippo, MD;  Location: [facility];  Service: Endoscopy;;  esophagus   BIOPSY  03/09/2021   Procedure: BIOPSY;  Surgeon: Dolores Frame, MD;  Location: [facility];  Service: Gastroenterology;;   BIV PACEMAKER INSERTION CRT-P N/A 05/12/2020   Procedure: BIV PACEMAKER INSERTION CRT-P;  Surgeon: Hillis Range, MD;  Location: [facility] INVASIVE CV LAB;  Service: Cardiovascular;  Laterality: N/A;   CATARACT EXTRACTION W/PHACO Left 12/29/2014   Procedure: CATARACT EXTRACTION PHACO AND INTRAOCULAR LENS PLACEMENT (IOC);  Surgeon: Susa Simmonds, MD;  Location: [facility];  Service: Ophthalmology;  Laterality: Left;  CDE:3.71   CATARACT EXTRACTION W/PHACO Right 03/30/2015   Procedure: CATARACT EXTRACTION PHACO AND INTRAOCULAR LENS PLACEMENT RIGHT EYE CDE=2.56;  Surgeon: Susa Simmonds, MD;  Location: [facility];  Service: Ophthalmology;  Laterality: Right;   CIRCUMCISION  10/11/2003   COLONOSCOPY N/A 07/11/2012   rehman,transverse colon polyp (benign lymphoid); tortuous colon; prep adequate with much suction/lavage; hemorrhoids.   CORONARY ARTERY BYPASS GRAFT  08/10/2000   Dr. Tyrone Sage - LIMA to LAD, SVG to  diagonal, SVG to PDA TRIPLE BYPASS   CYSTOSCOPY W/ RETROGRADES Bilateral 03/02/2015   Procedure: CYSTOSCOPY WITH RIGHT RETROGRADE PYELOGRAM,  ATTEMPTED LEFT RETROGRADE PYELOGRAM;  Surgeon: Malen Gauze, MD;  Location: [facility];  Service: Urology;  Laterality: Bilateral;   ESOPHAGEAL DILATION N/A 03/28/2018   Procedure: ESOPHAGEAL DILATION;  Surgeon: Malissa Hippo, MD;  Location: [facility];  Service: Endoscopy;  Laterality: N/A;   ESOPHAGEAL DILATION N/A 03/15/2022   Procedure: ESOPHAGEAL DILATION;   Surgeon: Dolores Frame, MD;  Location: [facility];  Service: Gastroenterology;  Laterality: N/A;   ESOPHAGOGASTRODUODENOSCOPY N/A 03/28/2018   rehman,Abnormal esophageal motility. mild schatzki ring at GEJ, dilated, patch of salmon colored mucosa at distal esophagus. Barrett's esophagus. 2cm HH. erosive gastropathy, normal pylorus, duodenal erosions w/o bleeding. normal second portion of duodenum   ESOPHAGOGASTRODUODENOSCOPY (EGD) WITH PROPOFOL N/A 03/09/2021   Procedure: ESOPHAGOGASTRODUODENOSCOPY (EGD) WITH PROPOFOL;  Surgeon: Dolores Frame, MD;  [facility]: [facility];  Service: Gastroenterology;  Laterality: N/A;  945   ESOPHAGOGASTRODUODENOSCOPY (EGD) WITH PROPOFOL N/A 03/15/2022   Procedure: ESOPHAGOGASTRODUODENOSCOPY (EGD) WITH PROPOFOL;  Surgeon: Dolores Frame, MD;  Location: [facility];  Service: Gastroenterology;  Laterality: N/A;  1045am, asa 3   RIGHT/LEFT HEART CATH AND CORONARY/GRAFT ANGIOGRAPHY N/A 08/26/2022   Procedure: RIGHT/LEFT HEART CATH AND CORONARY/GRAFT ANGIOGRAPHY;  Surgeon: Swaziland, Peter M, MD;  Location: [facility] INVASIVE CV LAB;  Service: Cardiovascular;  Laterality: N/A;   TEMPORARY PACEMAKER N/A 05/12/2020   Procedure: TEMPORARY PACEMAKER;  Surgeon: Swaziland, Peter M, MD;  Location: [facility] INVASIVE CV LAB;  Service: Cardiovascular;  Laterality: N/A;   TONSILLECTOMY     TRANSURETHRAL RESECTION OF BLADDER TUMOR N/A 01/26/2015   Procedure: TRANSURETHRAL RESECTION OF BLADDER TUMOR (TURBT);  Surgeon: Malen Gauze, MD;  Location: [facility];  Service: Urology;  Laterality: N/A;   TRANSURETHRAL RESECTION OF BLADDER TUMOR WITH GYRUS (TURBT-GYRUS) N/A 03/02/2015   Procedure: TRANSURETHRAL RESECTION OF BLADDER TUMOR WITH GYRUS (TURBT-GYRUS);  Surgeon: Malen Gauze, MD;  Location: [facility];  Service: Urology;  Laterality: N/A;   TRANSURETHRAL RESECTION OF PROSTATE N/A 01/26/2015   Procedure: TRANSURETHRAL RESECTION OF THE PROSTATE WITH GYRUS  INSTRUMENTS;  Surgeon: Malen Gauze, MD;  Location: [facility];  Service: Urology;  Laterality: N/A;    Social History   Socioeconomic History   Marital status: Widowed    Spouse name: Not on file   Number of children: Not on file   Years of education: Not on file   Highest education level: Not on file  Occupational History   Not on file  Tobacco Use   Smoking status: Former    Current packs/day: 0.00    Average packs/day: 1 pack/day for 15.0 years (15.0 ttl pk-yrs)    Types: Cigarettes    Start date: 01/11/1956    Quit date: 01/11/1971    Years since quitting: 51.6    Passive exposure: Past   Smokeless tobacco: Never  Vaping Use   Vaping status: Never Used  Substance and Sexual Activity   Alcohol use: Not Currently    Comment: occ.   Drug use: No   Sexual activity: Not Currently  Other Topics Concern   Not on file  Social History Narrative   Married with no children    Exercises 4 times weekly   Caffeine once a day   Social Determinants of Health   Financial Resource Strain: Not on file  Food Insecurity: No Food Insecurity (08/24/2022)   Hunger Vital Sign  Worried About Programme researcher, broadcasting/film/video in the Last Year: Never true    Ran Out of Food in the Last Year: Never true  Transportation Needs: No Transportation Needs (08/24/2022)   PRAPARE - Administrator, Civil Service (Medical): No    Lack of Transportation (Non-Medical): No  Physical Activity: Not on file  Stress: Not on file  Social Connections: Not on file  Intimate Partner Violence: Not At Risk (08/25/2022)   Humiliation, Afraid, Rape, and Kick questionnaire    Fear of Current or Ex-Partner: No    Emotionally Abused: No    Physically Abused: No    Sexually Abused: No   Family History  Problem Relation Age of Onset   Asthma Mother    Heart attack Father 50   Diabetes Brother       VITAL SIGNS BP 120/72   Pulse 62   Temp 98 F (36.7 C)   Resp 20   Ht 6' (1.829 m)   Wt 210 lb 3.2 oz  (95.3 kg)   SpO2 100%   BMI 28.51 kg/m   Outpatient Encounter Medications as of 09/05/2022  Medication Sig   divalproex (DEPAKOTE) 250 MG DR tablet Take 250 mg by mouth 2 (two) times daily.   loperamide (IMODIUM) 2 MG capsule Take 1 capsule (2 mg total) by mouth as needed for diarrhea or loose stools.   LORazepam (ATIVAN) 2 MG/ML concentrated solution Place 0.3 mLs (0.6 mg total) under the tongue every 6 (six) hours as needed for anxiety. The dose is 0.5 mg   melatonin 3 MG TABS tablet Take 1 tablet (3 mg total) by mouth at bedtime.   Morphine Sulfate (MORPHINE CONCENTRATE) 10 MG/0.5ML SOLN concentrated solution Place 0.25 mLs (5 mg total) under the tongue every 2 (two) hours as needed for moderate pain (or dyspnea).   temazepam (RESTORIL) 15 MG capsule Take 1 capsule (15 mg total) by mouth at bedtime as needed for sleep.   No facility-administered encounter medications on file as of 09/05/2022.     SIGNIFICANT DIAGNOSTIC EXAMS  LABS REVIEWED:   08-20-22: wbc 11.8; hgb 12.3; hct 35.5; mcv 96.2 plt 114; glucose 105; bun 56; creat 3.78; k+ 5.2; na++ 134; ba 8.6; gfr 15; protein 7.2; albumin 4.2 POC +; blood culture: no growth 08-21-22: BNP 2520.5 08-22-22: wbc 13.1; hgb 11.9; hct 35.5; mcv 93.7 plt 96; glucose 105; bun 67; creat 2.93; k+ 4.1; na++ 128; ca 8.4; gfr 21; hgb A1c 6.0; tsh 1.499; chol 132; ldl 73; trig 45; hdl 50; mag 2.0 08-29-22: wbc 9.3; hgb 10.7; hct 31.5; mcv 89.0 plt 91; glucose 351;bun 66; creat 2.30; k+ 3.6; na++ 117; ca 7.8; gfr 28; protein 5.6 albumin 2.9; vitamin B12: 741; phos 3.4 iron 64; tibc 256; ferritin 48 08-30-22: wbc 12.7; hcg 11.4; hct 33.7; mcv 89.4 plt   Review of Systems  Constitutional:  Negative for malaise/fatigue.  Respiratory:  Negative for cough and shortness of breath.   Cardiovascular:  Negative for chest pain, palpitations and leg swelling.  Gastrointestinal:  Negative for abdominal pain, constipation and heartburn.  Musculoskeletal:  Positive for  myalgias. Negative for back pain and joint pain.       Groin pain   Skin: Negative.   Neurological:  Negative for dizziness.  Psychiatric/Behavioral:  The patient is not nervous/anxious.    Physical Exam Constitutional:      General: He is not in acute distress.    Appearance: He is well-developed. He is not diaphoretic.  HENT:     Head: Atraumatic.     Right Ear: External ear normal.     Left Ear: External ear normal.     Mouth/Throat:     Mouth: Mucous membranes are moist.     Pharynx: Oropharynx is clear.  Eyes:     Conjunctiva/sclera: Conjunctivae normal.  Neck:     Thyroid: No thyromegaly.  Cardiovascular:     Rate and Rhythm: Normal rate. Rhythm irregular.     Heart sounds: Murmur heard.  Pulmonary:     Effort: Pulmonary effort is normal. No respiratory distress.     Breath sounds: Normal breath sounds.  Abdominal:     General: Bowel sounds are normal. There is no distension.     Palpations: Abdomen is soft.     Tenderness: There is no abdominal tenderness.  Musculoskeletal:        General: Normal range of motion.     Cervical back: Neck supple.     Right lower leg: No edema.     Left lower leg: No edema.  Lymphadenopathy:     Cervical: No cervical adenopathy.  Skin:    General: Skin is warm and dry.  Neurological:     Mental Status: He is alert and oriented to person, place, and time.  Psychiatric:        Mood and Affect: Mood normal.       ASSESSMENT/ PLAN:  TODAY  Acute on chronic sytolic CHF (congestive heart failure) / biventricular CHF (congestive heart failure / cardiomyopathy unspecified type Hypertensive heart and kidney disease with biventricular heart failure and stage 3a chronic kidney disease Thrombocytopenia Atrial fibrillation chronic: has pace maker Mixed hyperlipidemia Bipolar affective disorder remission status unspecified: will continue depakote 250 mg twice daily  Protein calorie malnutrition severe   The focus of his care is for  comfort only: MOST form has been filled out (20 minutes with advanced directives)    Synthia Innocent NP [facility]   call [phone]

## 2022-09-06 ENCOUNTER — Non-Acute Institutional Stay (SKILLED_NURSING_FACILITY): Payer: Medicare HMO | Admitting: Internal Medicine

## 2022-09-06 ENCOUNTER — Encounter: Payer: Self-pay | Admitting: Internal Medicine

## 2022-09-06 DIAGNOSIS — I214 Non-ST elevation (NSTEMI) myocardial infarction: Secondary | ICD-10-CM

## 2022-09-06 DIAGNOSIS — E43 Unspecified severe protein-calorie malnutrition: Secondary | ICD-10-CM

## 2022-09-06 DIAGNOSIS — I5023 Acute on chronic systolic (congestive) heart failure: Secondary | ICD-10-CM

## 2022-09-06 DIAGNOSIS — R1031 Right lower quadrant pain: Secondary | ICD-10-CM | POA: Diagnosis not present

## 2022-09-06 NOTE — Progress Notes (Unsigned)
NURSING HOME LOCATION:  [facility] ROOM NUMBER: 125P  CODE STATUS: DNR  PCP:  Carylon Perches MD  This is a comprehensive admission note to this SNFperformed on this date less than 30 days from date of admission. Included are preadmission medical/surgical history; reconciled medication list; family history; social history and comprehensive review of systems.  Corrections and additions to the records were documented. Comprehensive physical exam was also performed. Additionally a clinical summary was entered for each active diagnosis pertinent to this admission in the Problem List to enhance continuity of care.  HPI: He was hospitalized 8/10 - 09/02/2022 with non-STEMI complicated by acute on chronic systolic congestive heart failure and AKI. He actually presented to the ED 8/10 with complaints of diarrhea for 24 hours PTA.  He also mentioned that he had intermittent chest pain with associated dyspnea and diaphoresis. In the ED blood pressure was 87/75 and O2 sats were 82%.  He was admitted for acute on chronic systolic congestive heart failure with cardiogenic shock.  EF was less than 20%.  BNP was 2520.5.Troponin was 13,275. Course was complicated by prolonged monomorphic V. tach.  Cath revealed an occluded SVG to the diagonal.  Cardiology felt that there was no indication for any aggressive intervention. While hospitalized GFR ranged from a low of 18 up to a high of 37; final value was 24 indicating CKD stage IV.  Creatinine initially was 3.36 and subsequently ranged from 1.82 up to 3.35.  Hyponatremia was present with sodium ranging from a low of 117 up to high of 127.Marland Kitchen  Final value was 120.  Glucose ranged from a low of 100 up to high of 159.  Mild anemia was present with H/H ranging from a low of 11.4/31.5 up to a high of 13.6/40.  Platelet count ranged from a low of 85,000 up to 134,000.  TSH was 6.222 on 8/19 but had been 1.499 on 8/12. Palliative Care consulted and he was  transitioned to comfort care.  He was discharged to the SNF as family is considering placement as he lives alone and is unable to care for himself.  His niece and nephew are the primary caregivers for his sister.  Past medical and surgical history includes history of asthma, history of atrial fibrillation, history of bladder cancer, BPH, ischemic cardiomyopathy, chronic congestive heart failure, essential hypertension, dyslipidemia, CAD with history of MI, and history of bipolar disorder. Surgeries and procedures include history of pacemaker insertion, colonoscopy, CABG, esophageal dilation, and transurethral resection of bladder tumor and prostate.  Social history: Not currently drinker; former smoker with at least 15-pack-year history.  Family history: Reviewed, noncontributory due to advanced age.   Review of systems: Clinically he appeared to confabulate somewhat intermittently.  When asked why he had been hospitalized he stated that his heart had been "fluttering."  He then went on to discuss having had syncope 4 years ago.  He indicates he had CPR at that time and subsequently a pacemaker placement.  He did not validate having had a heart attack and heart failure until I mentioned these. He denies chest pain or symptoms of congestive heart failure at this time. He also elaborated about his past history of "tumor in the bladder."  He stated that they had discussed various therapies which were "80% unsuccessful and 20% successful in their experience".  He then described them injecting "tuberculosis in my penis & Clorox in the toilet after I urinated."  Apparently he may have been discussing chemotherapy. He  discussed having severe pain in his groin mainly at night for which he is taking morphine.  He describes it as sharp and continuous and nonradiating.  He believes it may have been related to the cardiac catheterization procedure. He also describes constipation.  Constitutional: No fever,  significant weight change  Eyes: No redness, discharge, pain, vision change ENT/mouth: No nasal congestion, purulent discharge, earache, change in hearing, sore throat  Cardiovascular: No palpitations, paroxysmal nocturnal dyspnea, claudication, edema  Respiratory: No cough, sputum production, hemoptysis, DOE, significant snoring, apnea  Gastrointestinal: No heartburn, dysphagia, abdominal pain, nausea /vomiting, rectal bleeding, melena Genitourinary: No dysuria, hematuria, pyuria, incontinence, nocturia Musculoskeletal: No joint stiffness, joint swelling, weakness, pain Dermatologic: No rash, pruritus, change in appearance of skin Neurologic: No dizziness, headache, syncope, seizures, numbness, tingling Psychiatric: No significant anxiety, depression, insomnia, anorexia Endocrine: No change in hair/skin/nails, excessive thirst, excessive hunger, excessive urination  Hematologic/lymphatic: No significant bruising, lymphadenopathy, abnormal bleeding Allergy/immunology: No itchy/watery eyes, significant sneezing, urticaria, angioedema  Physical exam:  Pertinent or positive findings: He appears his age but he appears surprisingly stable clinically in view of the complex cardiac history.  Pattern alopecia is present.  Eyebrows are decreased laterally.  He has minor low-grade rales at the bases.  Heart sounds are distant.  Pedal pulses are decreased.  He has ecchymosis and eschar over the left forearm.  Interosseous wasting is present.  He describes some tenderness in the right testicle to palpation.  There is no change in temperature or color of the skin in this area.  General appearance: no acute distress, increased work of breathing is present.   Lymphatic: No lymphadenopathy about the head, neck, axilla. Eyes: No conjunctival inflammation or lid edema is present. There is no scleral icterus. Ears:  External ear exam shows no significant lesions or deformities.   Nose:  External nasal examination  shows no deformity or inflammation. Nasal mucosa are pink and moist without lesions, exudates Oral exam: Lips and gums are healthy appearing.There is no oropharyngeal erythema or exudate. Neck:  No thyromegaly, masses, tenderness noted.    Heart:  Normal rate and regular rhythm. S1 and S2 normal without gallop, murmur, click, rub.  Lungs:  without wheezes, rhonchi, rubs. Abdomen: Bowel sounds are normal.  Abdomen is soft and nontender with no organomegaly, hernias, masses. Extremities:  No cyanosis, clubbing, edema. Neurologic exam: Balance, Rhomberg, finger to nose testing could not be completed due to clinical state Skin: Warm & dry w/o tenting. No significant rash.  See clinical summary under each active problem in the Problem List with associated updated therapeutic plan

## 2022-09-06 NOTE — Patient Instructions (Signed)
See assessment and plan under each diagnosis in the problem list and acutely for this visit 

## 2022-09-06 NOTE — Assessment & Plan Note (Signed)
CHF clinically compensated; no NVD or peripheral edema. No change in present cardiac regimen indicated.  

## 2022-09-06 NOTE — Assessment & Plan Note (Signed)
Current albumin 2.9 and total protein 5.6.  He has interosseous wasting on exam.  Protein/caloric malnutrition contributes to his adult failure to thrive; but the major issue is his ischemic cardiomyopathy and acute on chronic congestive heart failure.  Comfort care indicated.

## 2022-09-06 NOTE — Assessment & Plan Note (Signed)
He denies any anginal equivalent at this time.  Hospice has consulted and family does not believe that he can return home.

## 2022-09-08 ENCOUNTER — Encounter: Payer: Self-pay | Admitting: Internal Medicine

## 2022-09-08 ENCOUNTER — Non-Acute Institutional Stay (SKILLED_NURSING_FACILITY): Payer: Medicare HMO | Admitting: Internal Medicine

## 2022-09-08 DIAGNOSIS — R1031 Right lower quadrant pain: Secondary | ICD-10-CM | POA: Diagnosis not present

## 2022-09-08 DIAGNOSIS — R1032 Left lower quadrant pain: Secondary | ICD-10-CM | POA: Diagnosis not present

## 2022-09-08 DIAGNOSIS — G4733 Obstructive sleep apnea (adult) (pediatric): Secondary | ICD-10-CM | POA: Diagnosis not present

## 2022-09-08 DIAGNOSIS — Z9189 Other specified personal risk factors, not elsewhere classified: Secondary | ICD-10-CM

## 2022-09-08 NOTE — Progress Notes (Signed)
   NURSING HOME LOCATION:  [facility] ROOM NUMBER:  125P  CODE STATUS: DNR  PCP:  Carylon Perches MD  This is a nursing facility follow up visit for specific acute issue of inguinal pain.   Interim medical record and care since last SNF visit was updated with review of diagnostic studies and change in clinical status since last visit were documented.  HPI: He continues to request oral morphine for groin pain.  When I saw him 8/27 he localized the pain to the right testicle and on exam there was tenderness to palpation.  There were no skin color or temperature changes to suggest active infection.  He is being rechecked because of the persistent complaint of inguinal pain for which he is taking the morphine. The morphine is of concern as he has a high risk for respiratory suppression and premature death from his comorbidities in the context of opiods. The risk calculator for such indicates an over 80% risk of death in the next 6 months if he continues the morphine on a regular basis. Staff also reports that he is extremely anxious intermittently.  Physical exam:  Pertinent or positive findings: Exam focused on the GU exam. When his pamper was taken down the most striking finding was extensive ecchymosis at the waist level extending to the right posterior thorax.  He states that this occurred after a fall in the hospital and is not new.  There is no inguinal lymphadenopathy.  The scrotum and the testicle have normal appearance.  The right testicle was not tender to palpation today but the left is slightly so.  There is no clinical evidence of epididymitis.  There is a minor hernia on the left without incarceration.  See summary under each active problem in the Problem List with associated updated therapeutic plan

## 2022-09-08 NOTE — Assessment & Plan Note (Addendum)
Today no R testicular tenderness but now present on L. Clinically no epididymitis. Plan trial of Tramadol 25 mg bid with titration to 200 mg max daily dose. He is not on SSRI so risk of serotonin syndrome is not Advertising account executive.  Tramadol would be safer than his continued use of as needed morphine in the context of his sleep apnea. Meloxicam is not an option due to his ESRD.

## 2022-09-08 NOTE — Assessment & Plan Note (Addendum)
He denies significant snoring or frank apnea.  He has never been on maintenance CPAP.  The morphine that he is taking for of his inguinal pain has significant respiratory suppression risk. This was discussed with him.

## 2022-09-08 NOTE — Patient Instructions (Signed)
See assessment and plan under each diagnosis in the problem list and acutely for this visit 

## 2022-09-08 NOTE — Assessment & Plan Note (Addendum)
Attempt a trial of tramadol 25 mg twice daily on a regular basis with titration up to maximum daily dose of 200 mg in the context of his ESRD without dialysis.

## 2022-09-12 ENCOUNTER — Other Ambulatory Visit: Payer: Self-pay | Admitting: Adult Health

## 2022-09-12 MED ORDER — TEMAZEPAM 15 MG PO CAPS: 15.0000 mg | ORAL_CAPSULE | Freq: Every evening | ORAL | 0 refills | Status: DC | PRN

## 2022-09-14 ENCOUNTER — Encounter (HOSPITAL_COMMUNITY): Payer: Medicare HMO

## 2022-09-15 ENCOUNTER — Encounter: Payer: Self-pay | Admitting: Adult Health

## 2022-09-15 ENCOUNTER — Non-Acute Institutional Stay (SKILLED_NURSING_FACILITY): Payer: Self-pay | Admitting: Adult Health

## 2022-09-15 DIAGNOSIS — I5082 Biventricular heart failure: Secondary | ICD-10-CM | POA: Diagnosis not present

## 2022-09-15 NOTE — Progress Notes (Signed)
Location:  [facility] Room Number: 125 Place of Service:  SNF (31)   CODE STATUS: dnr   Allergies  Allergen Reactions   Spironolactone Other (See Comments)    Hyperkalemia    Chief Complaint  Patient presents with   Acute Visit    Edema     HPI:  The focus of his care is comfort. He is developing ascites. His EF is less than 20%. There are no reports of worsening shortness of breath. He does have abdominal pain. The morphine is effective for pain management. He does have increased abdominal fluid present.   Past Medical History:  Diagnosis Date   Allergic rhinitis    Arthritis    Asthma    Childhood   Atrial fibrillation (HCC)    Remote history - WARCEF   Bladder cancer (HCC)    BPH (benign prostatic hyperplasia)    Cardiomyopathy (HCC)    CHF (congestive heart failure) (HCC)    a. EF 30-35% by echo in 2018 and 10/2018, at 25-30% in 05/2020   Coronary atherosclerosis of native coronary artery    Multivessel s/p CABG in 2002 post-IMI, LVEF 35% up to 50% postoperatively;   Depression    Difficulty sleeping    Erectile dysfunction    Essential hypertension    Frequency of urination    History of bipolar disorder    Hyperlipidemia    IBS (irritable bowel syndrome)    Myocardial infarction (HCC) 2002    Past Surgical History:  Procedure Laterality Date   BIOPSY  03/28/2018   Procedure: BIOPSY;  Surgeon: Malissa Hippo, MD;  [facility]: [facility];  Service: Endoscopy;;  esophagus   BIOPSY  03/09/2021   Procedure: BIOPSY;  Surgeon: Dolores Frame, MD;  [facility];  Service: Gastroenterology;;   BIV PACEMAKER INSERTION CRT-P N/A 05/12/2020   Procedure: BIV PACEMAKER INSERTION CRT-P;  Surgeon: Hillis Range, MD;  Location: [facility] CV [facility];  Service: Cardiovascular;  Laterality: N/A;   CATARACT EXTRACTION W/PHACO Left 12/29/2014   Procedure: CATARACT EXTRACTION PHACO AND INTRAOCULAR LENS PLACEMENT (IOC);  Surgeon:  Susa Simmonds, MD;  Location: [facility];  Service: Ophthalmology;  Laterality: Left;  CDE:3.71   CATARACT EXTRACTION W/PHACO Right 03/30/2015   Procedure: CATARACT EXTRACTION PHACO AND INTRAOCULAR LENS PLACEMENT RIGHT EYE CDE=2.56;  Surgeon: Susa Simmonds, MD;  Location: [facility];  Service: Ophthalmology;  Laterality: Right;   CIRCUMCISION  10/11/2003   COLONOSCOPY N/A 07/11/2012   rehman,transverse colon polyp (benign lymphoid); tortuous colon; prep adequate with much suction/lavage; hemorrhoids.   CORONARY ARTERY BYPASS GRAFT  08/10/2000   Dr. Tyrone Sage - LIMA to LAD, SVG to diagonal, SVG to PDA TRIPLE BYPASS   CYSTOSCOPY W/ RETROGRADES Bilateral 03/02/2015   Procedure: CYSTOSCOPY WITH RIGHT RETROGRADE PYELOGRAM,  ATTEMPTED LEFT RETROGRADE PYELOGRAM;  Surgeon: Malen Gauze, MD;  Location: [facility];  Service: Urology;  Laterality: Bilateral;   ESOPHAGEAL DILATION N/A 03/28/2018   Procedure: ESOPHAGEAL DILATION;  Surgeon: Malissa Hippo, MD;  [facility]: [facility];  Service: Endoscopy;  Laterality: N/A;   ESOPHAGEAL DILATION N/A 03/15/2022   Procedure: ESOPHAGEAL DILATION;  Surgeon: Dolores Frame, MD;  [facility];  Service: Gastroenterology;  Laterality: N/A;   ESOPHAGOGASTRODUODENOSCOPY N/A 03/28/2018   rehman,Abnormal esophageal motility. mild schatzki ring at GEJ, dilated, patch of salmon colored mucosa at distal esophagus. Barrett's esophagus. 2cm HH. erosive gastropathy, normal pylorus, duodenal erosions w/o bleeding. normal second portion of duodenum  ESOPHAGOGASTRODUODENOSCOPY (EGD) WITH PROPOFOL N/A 03/09/2021   Procedure: ESOPHAGOGASTRODUODENOSCOPY (EGD) WITH PROPOFOL;  Surgeon: Dolores Frame, MD;  [facility];  Service: Gastroenterology;  Laterality: N/A;  945   ESOPHAGOGASTRODUODENOSCOPY (EGD) WITH PROPOFOL N/A 03/15/2022   Procedure: ESOPHAGOGASTRODUODENOSCOPY (EGD) WITH PROPOFOL;  Surgeon: Dolores Frame, MD;  [facility];  Service: Gastroenterology;  Laterality: N/A;  1045am, asa 3   RIGHT/LEFT HEART CATH AND CORONARY/GRAFT ANGIOGRAPHY N/A 08/26/2022   Procedure: RIGHT/LEFT HEART CATH AND CORONARY/GRAFT ANGIOGRAPHY;  Surgeon: Swaziland, Peter M, MD;  Location: [facility] INVASIVE CV LAB;  Service: Cardiovascular;  Laterality: N/A;   TEMPORARY PACEMAKER N/A 05/12/2020   Procedure: TEMPORARY PACEMAKER;  Surgeon: Swaziland, Peter M, MD;  Location: [facility] INVASIVE CV LAB;  Service: Cardiovascular;  Laterality: N/A;   TONSILLECTOMY     TRANSURETHRAL RESECTION OF BLADDER TUMOR N/A 01/26/2015   Procedure: TRANSURETHRAL RESECTION OF BLADDER TUMOR (TURBT);  Surgeon: Malen Gauze, MD;  Location: [facility];  Service: Urology;  Laterality: N/A;   TRANSURETHRAL RESECTION OF BLADDER TUMOR WITH GYRUS (TURBT-GYRUS) N/A 03/02/2015   Procedure: TRANSURETHRAL RESECTION OF BLADDER TUMOR WITH GYRUS (TURBT-GYRUS);  Surgeon: Malen Gauze, MD;  Location: [facility];  Service: Urology;  Laterality: N/A;   TRANSURETHRAL RESECTION OF PROSTATE N/A 01/26/2015   Procedure: TRANSURETHRAL RESECTION OF THE PROSTATE WITH GYRUS INSTRUMENTS;  Surgeon: Malen Gauze, MD;  Location: [facility];  Service: Urology;  Laterality: N/A;    Social History   Socioeconomic History   Marital status: Widowed    Spouse name: Not on file   Number of children: Not on file   Years of education: Not on file   Highest education level: Not on file  Occupational History   Not on file  Tobacco Use   Smoking status: Former    Current packs/day: 0.00    Average packs/day: 1 pack/day for 15.0 years (15.0 ttl pk-yrs)    Types: Cigarettes    Start date: 01/11/1956    Quit date: 01/11/1971    Years since quitting: 51.7    Passive exposure: Past   Smokeless tobacco: Never  Vaping Use   Vaping status: Never Used  Substance and Sexual Activity   Alcohol use: Not Currently    Comment: occ.   Drug use: No   Sexual activity: Not Currently  Other Topics Concern    Not on file  Social History Narrative   Married with no children    Exercises 4 times weekly   Caffeine once a day   Social Determinants of Health   Financial Resource Strain: Not on file  Food Insecurity: No Food Insecurity (08/24/2022)   Hunger Vital Sign    Worried About Running Out of Food in the Last Year: Never true    Ran Out of Food in the Last Year: Never true  Transportation Needs: No Transportation Needs (08/24/2022)   PRAPARE - Administrator, Civil Service (Medical): No    Lack of Transportation (Non-Medical): No  Physical Activity: Not on file  Stress: Not on file  Social Connections: Not on file  Intimate Partner Violence: Not At Risk (08/25/2022)   Humiliation, Afraid, Rape, and Kick questionnaire    Fear of Current or Ex-Partner: No    Emotionally Abused: No    Physically Abused: No    Sexually Abused: No   Family History  Problem Relation Age of Onset   Asthma Mother    Heart attack Father 75   Diabetes Brother  VITAL SIGNS BP 136/70   Pulse 80   Temp 98 F (36.7 C)   Resp 18   Ht 6' (1.829 m)   Wt 207 lb 9.6 oz (94.2 kg)   SpO2 96%   BMI 28.16 kg/m   Outpatient Encounter Medications as of 09/15/2022  Medication Sig   divalproex (DEPAKOTE) 250 MG DR tablet Take 250 mg by mouth 2 (two) times daily.   loperamide (IMODIUM) 2 MG capsule Take 1 capsule (2 mg total) by mouth as needed for diarrhea or loose stools.   LORazepam (ATIVAN) 2 MG/ML concentrated solution Place 0.3 mLs (0.6 mg total) under the tongue every 6 (six) hours as needed for anxiety. The dose is 0.5 mg   melatonin 3 MG TABS tablet Take 1 tablet (3 mg total) by mouth at bedtime.   Morphine Sulfate (MORPHINE CONCENTRATE) 10 MG/0.5ML SOLN concentrated solution Place 0.25 mLs (5 mg total) under the tongue every 2 (two) hours as needed for moderate pain (or dyspnea).   temazepam (RESTORIL) 15 MG capsule Take 1 capsule (15 mg total) by mouth at bedtime as needed for sleep.    No facility-administered encounter medications on file as of 09/15/2022.     SIGNIFICANT DIAGNOSTIC EXAMS  08-20-22: wbc 11.8; hgb 12.3; hct 35.5; mcv 96.2 plt 114; glucose 105; bun 56; creat 3.78; k+ 5.2; na++ 134; ba 8.6; gfr 15; protein 7.2; albumin 4.2 POC +; blood culture: no growth 08-21-22: BNP 2520.5 08-22-22: wbc 13.1; hgb 11.9; hct 35.5; mcv 93.7 plt 96; glucose 105; bun 67; creat 2.93; k+ 4.1; na++ 128; ca 8.4; gfr 21; hgb A1c 6.0; tsh 1.499; chol 132; ldl 73; trig 45; hdl 50; mag 2.0 08-29-22: wbc 9.3; hgb 10.7; hct 31.5; mcv 89.0 plt 91; glucose 351;bun 66; creat 2.30; k+ 3.6; na++ 117; ca 7.8; gfr 28; protein 5.6 albumin 2.9; vitamin B12: 741; phos 3.4 iron 64; tibc 256; ferritin 48 08-30-22: wbc 12.7; hcg 11.4; hct 33.7; mcv 89.4 plt 85   Review of Systems  Constitutional:  Negative for malaise/fatigue.  Respiratory:  Negative for cough and shortness of breath.   Cardiovascular:  Negative for chest pain, palpitations and leg swelling.  Gastrointestinal:  Positive for abdominal pain. Negative for constipation and heartburn.  Musculoskeletal:  Negative for back pain, joint pain and myalgias.  Skin: Negative.   Neurological:  Negative for dizziness.  Psychiatric/Behavioral:  The patient is not nervous/anxious.    Physical Exam Constitutional:      General: He is not in acute distress.    Appearance: He is well-developed. He is not diaphoretic.  Neck:     Thyroid: No thyromegaly.  Cardiovascular:     Rate and Rhythm: Normal rate and regular rhythm.     Pulses: Normal pulses.     Heart sounds: Murmur heard.  Pulmonary:     Effort: Pulmonary effort is normal. No respiratory distress.     Breath sounds: Normal breath sounds.  Abdominal:     General: Bowel sounds are normal. There is distension.     Palpations: Abdomen is soft.     Tenderness: There is no abdominal tenderness.     Comments: With fluid present  Musculoskeletal:        General: Normal range of motion.      Cervical back: Neck supple.     Right lower leg: No edema.     Left lower leg: No edema.  Lymphadenopathy:     Cervical: No cervical adenopathy.  Skin:  General: Skin is warm and dry.  Neurological:     Mental Status: He is alert. Mental status is at baseline.  Psychiatric:        Mood and Affect: Mood normal.       ASSESSMENT/ PLAN:   TODAY  Biventricular congestive heart failure: will begin him on lasix 20 mg daily to help with fluid management. Will begin miralax daily for constipation   Synthia Innocent NP [facility]  call [phone]

## 2022-09-18 ENCOUNTER — Other Ambulatory Visit (INDEPENDENT_AMBULATORY_CARE_PROVIDER_SITE_OTHER): Payer: Self-pay | Admitting: Gastroenterology

## 2022-09-18 DIAGNOSIS — K219 Gastro-esophageal reflux disease without esophagitis: Secondary | ICD-10-CM

## 2022-09-20 ENCOUNTER — Telehealth (HOSPITAL_COMMUNITY): Payer: Self-pay | Admitting: Family Medicine

## 2022-09-20 ENCOUNTER — Encounter (INDEPENDENT_AMBULATORY_CARE_PROVIDER_SITE_OTHER): Payer: Self-pay | Admitting: Gastroenterology

## 2022-09-20 NOTE — Telephone Encounter (Signed)
Noted HFU on 9/4  Pt established with Dr Diona Browner

## 2022-09-20 NOTE — Telephone Encounter (Signed)
Pt lvm, do not care to r/s appt at this time

## 2022-09-22 ENCOUNTER — Non-Acute Institutional Stay (SKILLED_NURSING_FACILITY): Payer: Medicare HMO | Admitting: Adult Health

## 2022-09-22 ENCOUNTER — Encounter: Payer: Self-pay | Admitting: Adult Health

## 2022-09-22 DIAGNOSIS — I130 Hypertensive heart and chronic kidney disease with heart failure and stage 1 through stage 4 chronic kidney disease, or unspecified chronic kidney disease: Secondary | ICD-10-CM | POA: Diagnosis not present

## 2022-09-22 DIAGNOSIS — N1831 Chronic kidney disease, stage 3a: Secondary | ICD-10-CM

## 2022-09-22 DIAGNOSIS — I5082 Biventricular heart failure: Secondary | ICD-10-CM | POA: Diagnosis not present

## 2022-09-22 DIAGNOSIS — I700 Atherosclerosis of aorta: Secondary | ICD-10-CM | POA: Diagnosis not present

## 2022-09-22 NOTE — Progress Notes (Signed)
Location:  [facility] Room Number: 125 Place of Service:  SNF (31)   CODE STATUS: dnr   Allergies  Allergen Reactions   Spironolactone Other (See Comments)    Hyperkalemia    Chief Complaint  Patient presents with   Acute Visit    Care plan meeting     HPI:  We have come together for his care plan meeting. Family present.  BIMS 15/15 mood 3/30: not sleeping well; some depression. He is out of bed to wheelchair without falls. He requires moderate to dependent assist with his adls. He is frequently incontinent of bladder and bowel. Dietary: regular diet requires setup for meals; weight is 211.2 pounds; appetite 76-100%. Therapy: none at this time. Activities: does participate. He continues to be followed by hospice care. He will continue to be followed for his chronic illnesses including:   Aortic atherosclerosis    Biventricular CHF (congestive heart failure)    Hypertensive heart and kidney disease with biventricular  heart failure and stage 3a chronic kidney disease  Past Medical History:  Diagnosis Date   Allergic rhinitis    Arthritis    Asthma    Childhood   Atrial fibrillation (HCC)    Remote history - WARCEF   Bladder cancer (HCC)    BPH (benign prostatic hyperplasia)    Cardiomyopathy (HCC)    CHF (congestive heart failure) (HCC)    a. EF 30-35% by echo in 2018 and 10/2018, at 25-30% in 05/2020   Coronary atherosclerosis of native coronary artery    Multivessel s/p CABG in 2002 post-IMI, LVEF 35% up to 50% postoperatively;   Depression    Difficulty sleeping    Erectile dysfunction    Essential hypertension    Frequency of urination    History of bipolar disorder    Hyperlipidemia    IBS (irritable bowel syndrome)    Myocardial infarction (HCC) 2002    Past Surgical History:  Procedure Laterality Date   BIOPSY  03/28/2018   Procedure: BIOPSY;  Surgeon: Malissa Hippo, MD;  [facility];  Service: Endoscopy;;  esophagus    BIOPSY  03/09/2021   Procedure: BIOPSY;  Surgeon: Dolores Frame, MD;  [facility];  Service: Gastroenterology;;   BIV PACEMAKER INSERTION CRT-P N/A 05/12/2020   Procedure: BIV PACEMAKER INSERTION CRT-P;  Surgeon: Hillis Range, MD;  Location: [facility];  Service: Cardiovascular;  Laterality: N/A;   CATARACT EXTRACTION W/PHACO Left 12/29/2014   Procedure: CATARACT EXTRACTION PHACO AND INTRAOCULAR LENS PLACEMENT (IOC);  Surgeon: Susa Simmonds, MD;  Location: [facility];  Service: Ophthalmology;  Laterality: Left;  CDE:3.71   CATARACT EXTRACTION W/PHACO Right 03/30/2015   Procedure: CATARACT EXTRACTION PHACO AND INTRAOCULAR LENS PLACEMENT RIGHT EYE CDE=2.56;  Surgeon: Susa Simmonds, MD;  Location: [facility];  Service: Ophthalmology;  Laterality: Right;   CIRCUMCISION  10/11/2003   COLONOSCOPY N/A 07/11/2012   rehman,transverse colon polyp (benign lymphoid); tortuous colon; prep adequate with much suction/lavage; hemorrhoids.   CORONARY ARTERY BYPASS GRAFT  08/10/2000   Dr. Tyrone Sage - LIMA to LAD, SVG to diagonal, SVG to PDA TRIPLE BYPASS   CYSTOSCOPY W/ RETROGRADES Bilateral 03/02/2015   Procedure: CYSTOSCOPY WITH RIGHT RETROGRADE PYELOGRAM,  ATTEMPTED LEFT RETROGRADE PYELOGRAM;  Surgeon: Malen Gauze, MD;  Location: [facility];  Service: Urology;  Laterality: Bilateral;   ESOPHAGEAL DILATION N/A 03/28/2018   Procedure: ESOPHAGEAL DILATION;  Surgeon: Malissa Hippo, MD;  [facility];  Service: Endoscopy;  Laterality: N/A;   ESOPHAGEAL DILATION N/A 03/15/2022   Procedure: ESOPHAGEAL DILATION;  Surgeon: Dolores Frame, MD;  [facility];  Service: Gastroenterology;  Laterality: N/A;   ESOPHAGOGASTRODUODENOSCOPY N/A 03/28/2018   rehman,Abnormal esophageal motility. mild schatzki ring at GEJ, dilated, patch of salmon colored mucosa at distal esophagus. Barrett's esophagus. 2cm HH. erosive gastropathy, normal pylorus, duodenal erosions  w/o bleeding. normal second portion of duodenum   ESOPHAGOGASTRODUODENOSCOPY (EGD) WITH PROPOFOL N/A 03/09/2021   Procedure: ESOPHAGOGASTRODUODENOSCOPY (EGD) WITH PROPOFOL;  Surgeon: Dolores Frame, MD;  [facility];  Service: Gastroenterology;  Laterality: N/A;  945   ESOPHAGOGASTRODUODENOSCOPY (EGD) WITH PROPOFOL N/A 03/15/2022   Procedure: ESOPHAGOGASTRODUODENOSCOPY (EGD) WITH PROPOFOL;  Surgeon: Dolores Frame, MD;  [facility];  Service: Gastroenterology;  Laterality: N/A;  1045am, asa 3   RIGHT/LEFT HEART CATH AND CORONARY/GRAFT ANGIOGRAPHY N/A 08/26/2022   Procedure: RIGHT/LEFT HEART CATH AND CORONARY/GRAFT ANGIOGRAPHY;  Surgeon: Swaziland, Peter M, MD;  Location: [facility] INVASIVE CV LAB;  Service: Cardiovascular;  Laterality: N/A;   TEMPORARY PACEMAKER N/A 05/12/2020   Procedure: TEMPORARY PACEMAKER;  Surgeon: Swazil[place], Peter M, MD;  Location: [facility] INVASIVE CV LAB;  Service: Cardiovascular;  Laterality: N/A;   TONSILLECTOMY     TRANSURETHRAL RESECTION OF BLADDER TUMOR N/A 01/26/2015   Procedure: TRANSURETHRAL RESECTION OF BLADDER TUMOR (TURBT);  Surgeon: Malen Gauze, MD;  Location: [facility];  Service: Urology;  Laterality: N/A;   TRANSURETHRAL RESECTION OF BLADDER TUMOR WITH GYRUS (TURBT-GYRUS) N/A 03/02/2015   Procedure: TRANSURETHRAL RESECTION OF BLADDER TUMOR WITH GYRUS (TURBT-GYRUS);  Surgeon: Malen Gauze, MD;  Location: [facility];  Service: Urology;  Laterality: N/A;   TRANSURETHRAL RESECTION OF PROSTATE N/A 01/26/2015   Procedure: TRANSURETHRAL RESECTION OF THE PROSTATE WITH GYRUS INSTRUMENTS;  Surgeon: Malen Gauze, MD;  Location: [facility];  Service: Urology;  Laterality: N/A;    Social History   Socioeconomic History   Marital status: Widowed    Spouse name: Not on file   Number of children: Not on file   Years of education: Not on file   Highest education level: Not on file  Occupational History   Not on file  Tobacco Use    Smoking status: Former    Current packs/day: 0.00    Average packs/day: 1 pack/day for 15.0 years (15.0 ttl pk-yrs)    Types: Cigarettes    Start date: 01/11/1956    Quit date: 01/11/1971    Years since quitting: 51.7    Passive exposure: Past   Smokeless tobacco: Never  Vaping Use   Vaping status: Never Used  Substance and Sexual Activity   Alcohol use: Not Currently    Comment: occ.   Drug use: No   Sexual activity: Not Currently  Other Topics Concern   Not on file  Social History Narrative   Married with no children    Exercises 4 times weekly   Caffeine once a day   Social Determinants of Health   Financial Resource Strain: Not on file  Food Insecurity: No Food Insecurity (08/24/2022)   Hunger Vital Sign    Worried About Running Out of Food in the Last Year: Never true    Ran Out of Food in the Last Year: Never true  Transportation Needs: No Transportation Needs (08/24/2022)   PRAPARE - Administrator, Civil Service (Medical): No    Lack of Transportation (Non-Medical): No  Physical Activity: Not on file  Stress: Not  on file  Social Connections: Not on file  Intimate Partner Violence: Not At Risk (08/25/2022)   Humiliation, Afraid, Rape, and Kick questionnaire    Fear of Current or Ex-Partner: No    Emotionally Abused: No    Physically Abused: No    Sexually Abused: No   Family History  Problem Relation Age of Onset   Asthma Mother    Heart attack Father 57   Diabetes Brother       VITAL SIGNS BP 122/73   Pulse 76   Temp 97.6 F (36.4 C)   Resp 18   Ht 6' (1.829 m)   Wt 211 lb 3.2 oz (95.8 kg)   SpO2 98%   BMI 28.64 kg/m   Outpatient Encounter Medications as of 09/22/2022  Medication Sig   divalproex (DEPAKOTE) 250 MG DR tablet Take 250 mg by mouth 2 (two) times daily.   furosemide (LASIX) 20 MG tablet Take 20 mg by mouth daily.   loperamide (IMODIUM) 2 MG capsule Take 1 capsule (2 mg total) by mouth as needed for diarrhea or loose stools.    LORazepam (ATIVAN) 2 MG/ML concentrated solution Place 0.3 mLs (0.6 mg total) under the tongue every 6 (six) hours as needed for anxiety. The dose is 0.5 mg   melatonin 3 MG TABS tablet Take 1 tablet (3 mg total) by mouth at bedtime.   Morphine Sulfate (MORPHINE CONCENTRATE) 10 MG/0.5ML SOLN concentrated solution Place 0.25 mLs (5 mg total) under the tongue every 2 (two) hours as needed for moderate pain (or dyspnea).   temazepam (RESTORIL) 15 MG capsule Take 1 capsule (15 mg total) by mouth at bedtime as needed for sleep.   No facility-administered encounter medications on file as of 09/22/2022.     SIGNIFICANT DIAGNOSTIC EXAMS   08-20-22: wbc 11.8; hgb 12.3; hct 35.5; mcv 96.2 plt 114; glucose 105; bun 56; creat 3.78; k+ 5.2; na++ 134; ca 8.6; gfr 15; protein 7.2; albumin 4.2 POC +; blood culture: no growth 08-21-22: BNP 2520.5 08-22-22: wbc 13.1; hgb 11.9; hct 35.5; mcv 93.7 plt 96; glucose 105; bun 67; creat 2.93; k+ 4.1; na++ 128; ca 8.4; gfr 21; hgb A1c 6.0; tsh 1.499; chol 132; ldl 73; trig 45; hdl 50; mag 2.0 08-29-22: wbc 9.3; hgb 10.7; hct 31.5; mcv 89.0 plt 91; glucose 351;bun 66; creat 2.30; k+ 3.6; na++ 117; ca 7.8; gfr 28; protein 5.6 albumin 2.9; vitamin B12: 741; phos 3.4 iron 64; tibc 256; ferritin 48 08-30-22: wbc 12.7; hgb 11.4; hct 33.7; mcv 89.4 plt 85   Review of Systems  Constitutional:  Negative for malaise/fatigue.  Respiratory:  Negative for cough and shortness of breath.   Cardiovascular:  Negative for chest pain, palpitations and leg swelling.  Gastrointestinal:  Positive for abdominal pain. Negative for constipation and heartburn.  Musculoskeletal:  Negative for back pain, joint pain and myalgias.  Skin: Negative.   Neurological:  Negative for dizziness.  Psychiatric/Behavioral:  The patient is not nervous/anxious.    Physical Exam Constitutional:      General: He is not in acute distress.    Appearance: He is well-developed. He is not diaphoretic.  Neck:      Thyroid: No thyromegaly.  Cardiovascular:     Rate and Rhythm: Normal rate and regular rhythm.     Pulses: Normal pulses.     Heart sounds: Murmur heard.     Comments: Pacemaker  Pulmonary:     Effort: Pulmonary effort is normal. No respiratory distress.  Breath sounds: Normal breath sounds.  Abdominal:     General: Bowel sounds are normal. There is no distension.     Palpations: Abdomen is soft.     Tenderness: There is no abdominal tenderness.     Comments: Fluid present   Musculoskeletal:        General: Normal range of motion.     Cervical back: Neck supple.     Right lower leg: No edema.     Left lower leg: No edema.  Lymphadenopathy:     Cervical: No cervical adenopathy.  Skin:    General: Skin is warm and dry.  Neurological:     Mental Status: He is alert and oriented to person, place, and time.  Psychiatric:        Mood and Affect: Mood normal.       ASSESSMENT/ PLAN:  TODAY  Aortic atherosclerosis Biventricular CHF (congestive heart failure) Hypertensive heart and kidney disease with biventricular  heart failure and stage 3a chronic kidney disease  Will continue current medications Will continue current plan of care Will continue to monitor his status.  He will continue to be followed by hospice care.   Time spent with patient: 40 minutes: plan of care; dietary; medications.    Synthia Innocent NP [facility]   call [phone]

## 2022-09-26 ENCOUNTER — Other Ambulatory Visit: Payer: Self-pay | Admitting: Adult Health

## 2022-09-26 ENCOUNTER — Other Ambulatory Visit (HOSPITAL_COMMUNITY)
Admission: RE | Admit: 2022-09-26 | Discharge: 2022-09-26 | Disposition: A | Discharge status: Home / Self Care | Payer: Medicare Other | Source: Skilled Nursing Facility | Attending: Adult Health | Admitting: Adult Health

## 2022-09-26 DIAGNOSIS — I130 Hypertensive heart and chronic kidney disease with heart failure and stage 1 through stage 4 chronic kidney disease, or unspecified chronic kidney disease: Secondary | ICD-10-CM | POA: Insufficient documentation

## 2022-09-26 LAB — BASIC METABOLIC PANEL
Anion gap: 11 (ref 5–15)
BUN: 26 mg/dL — ABNORMAL HIGH (ref 8–23)
CO2: 26 mmol/L (ref 22–32)
Calcium: 8.6 mg/dL — ABNORMAL LOW (ref 8.9–10.3)
Chloride: 96 mmol/L — ABNORMAL LOW (ref 98–111)
Creatinine, Ser: 1.41 mg/dL — ABNORMAL HIGH (ref 0.61–1.24)
GFR, Estimated: 50 mL/min — ABNORMAL LOW (ref >60)
Glucose, Bld: 91 mg/dL (ref 70–99)
Potassium: 4.1 mmol/L (ref 3.5–5.1)
Sodium: 133 mmol/L — ABNORMAL LOW (ref 135–145)

## 2022-09-26 MED ORDER — TRAMADOL HCL 50 MG PO TABS
25.0000 mg | ORAL_TABLET | Freq: Two times a day (BID) | ORAL | 0 refills | Status: DC
Start: 2022-09-26 — End: 2022-10-24

## 2022-10-05 ENCOUNTER — Non-Acute Institutional Stay (SKILLED_NURSING_FACILITY): Payer: Medicare HMO | Admitting: Adult Health

## 2022-10-05 ENCOUNTER — Encounter: Payer: Self-pay | Admitting: Adult Health

## 2022-10-05 DIAGNOSIS — I5082 Biventricular heart failure: Secondary | ICD-10-CM | POA: Diagnosis not present

## 2022-10-05 DIAGNOSIS — I130 Hypertensive heart and chronic kidney disease with heart failure and stage 1 through stage 4 chronic kidney disease, or unspecified chronic kidney disease: Secondary | ICD-10-CM

## 2022-10-05 DIAGNOSIS — I429 Cardiomyopathy, unspecified: Secondary | ICD-10-CM | POA: Diagnosis not present

## 2022-10-05 DIAGNOSIS — I5023 Acute on chronic systolic (congestive) heart failure: Secondary | ICD-10-CM | POA: Diagnosis not present

## 2022-10-05 DIAGNOSIS — D696 Thrombocytopenia, unspecified: Secondary | ICD-10-CM

## 2022-10-05 DIAGNOSIS — N1831 Chronic kidney disease, stage 3a: Secondary | ICD-10-CM

## 2022-10-05 NOTE — Progress Notes (Signed)
Location:  [facility] Room Number: 125 Place of Service:  SNF (31)   CODE STATUS: dnr  Allergies  Allergen Reactions   Spironolactone Other (See Comments)    Hyperkalemia    Chief Complaint  Patient presents with   Medical Management of Chronic Issues          chronic sytolic CHF (congestive heart failure) / biventricular CHF (congestive heart failure / cardiomyopathy unspecified type        Hypertensive heart and kidney disease with biventricular heart failure and stage 3a chronic kidney disease     Thrombocytopenia     HPI:  He is a 82 year old long term resident of this facility being seen for the management of his chronic illnesses:  chronic sytolic CHF (congestive heart failure) / biventricular CHF (congestive heart failure / cardiomyopathy unspecified type        Hypertensive heart and kidney disease with biventricular heart failure and stage 3a chronic kidney disease     Thrombocytopenia. There are no reports of uncontrolled pain; no shortness of breath and no chest pain. He continues to be followed by hospice care.   Past Medical History:  Diagnosis Date   Allergic rhinitis    Arthritis    Asthma    Childhood   Atrial fibrillation (HCC)    Remote history - WARCEF   Bladder cancer (HCC)    BPH (benign prostatic hyperplasia)    Cardiomyopathy (HCC)    CHF (congestive heart failure) (HCC)    a. EF 30-35% by echo in 2018 and 10/2018, at 25-30% in 05/2020   Coronary atherosclerosis of native coronary artery    Multivessel s/p CABG in 2002 post-IMI, LVEF 35% up to 50% postoperatively;   Depression    Difficulty sleeping    Erectile dysfunction    Essential hypertension    Frequency of urination    History of bipolar disorder    Hyperlipidemia    IBS (irritable bowel syndrome)    Myocardial infarction (HCC) 2002    Past Surgical History:  Procedure Laterality Date   BIOPSY  03/28/2018   Procedure: BIOPSY;  Surgeon: Malissa Hippo, MD;   [facility]: [facility];  Service: Endoscopy;;  esophagus   BIOPSY  03/09/2021   Procedure: BIOPSY;  Surgeon: Dolores Frame, MD;  [facility]: [facility];  Service: Gastroenterology;;   BIV PACEMAKER INSERTION CRT-P N/A 05/12/2020   Procedure: BIV PACEMAKER INSERTION CRT-P;  Surgeon: Hillis Range, MD;  Location: [facility];  Service: Cardiovascular;  Laterality: N/A;   CATARACT EXTRACTION W/PHACO Left 12/29/2014   Procedure: CATARACT EXTRACTION PHACO AND INTRAOCULAR LENS PLACEMENT (IOC);  Surgeon: Susa Simmonds, MD;  Location: [facility];  Service: Ophthalmology;  Laterality: Left;  CDE:3.71   CATARACT EXTRACTION W/PHACO Right 03/30/2015   Procedure: CATARACT EXTRACTION PHACO AND INTRAOCULAR LENS PLACEMENT RIGHT EYE CDE=2.56;  Surgeon: Susa Simmonds, MD;  Location: [facility];  Service: Ophthalmology;  Laterality: Right;   CIRCUMCISION  10/11/2003   COLONOSCOPY N/A 07/11/2012   rehman,transverse colon polyp (benign lymphoid); tortuous colon; prep adequate with much suction/lavage; hemorrhoids.   CORONARY ARTERY BYPASS GRAFT  08/10/2000   Dr. Tyrone Sage - LIMA to LAD, SVG to diagonal, SVG to PDA TRIPLE BYPASS   CYSTOSCOPY W/ RETROGRADES Bilateral 03/02/2015   Procedure: CYSTOSCOPY WITH RIGHT RETROGRADE PYELOGRAM,  ATTEMPTED LEFT RETROGRADE PYELOGRAM;  Surgeon: Malen Gauze, MD;  Location: [facility];  Service: Urology;  Laterality: Bilateral;   ESOPHAGEAL DILATION  N/A 03/28/2018   Procedure: ESOPHAGEAL DILATION;  Surgeon: Malissa Hippo, MD;  [facility]: [facility];  Service: Endoscopy;  Laterality: N/A;   ESOPHAGEAL DILATION N/A 03/15/2022   Procedure: ESOPHAGEAL DILATION;  Surgeon: Dolores Frame, MD;  [facility];  Service: Gastroenterology;  Laterality: N/A;   ESOPHAGOGASTRODUODENOSCOPY N/A 03/28/2018   rehman,Abnormal esophageal motility. mild schatzki ring at GEJ, dilated, patch of salmon colored mucosa at distal esophagus. Barrett's esophagus. 2cm  HH. erosive gastropathy, normal pylorus, duodenal erosions w/o bleeding. normal second portion of duodenum   ESOPHAGOGASTRODUODENOSCOPY (EGD) WITH PROPOFOL N/A 03/09/2021   Procedure: ESOPHAGOGASTRODUODENOSCOPY (EGD) WITH PROPOFOL;  Surgeon: Dolores Frame, MD;  [facility];  Service: Gastroenterology;  Laterality: N/A;  945   ESOPHAGOGASTRODUODENOSCOPY (EGD) WITH PROPOFOL N/A 03/15/2022   Procedure: ESOPHAGOGASTRODUODENOSCOPY (EGD) WITH PROPOFOL;  Surgeon: Dolores Frame, MD;  [facility];  Service: Gastroenterology;  Laterality: N/A;  1045am, asa 3   RIGHT/LEFT HEART CATH AND CORONARY/GRAFT ANGIOGRAPHY N/A 08/26/2022   Procedure: RIGHT/LEFT HEART CATH AND CORONARY/GRAFT ANGIOGRAPHY;  Surgeon: Swaziland, Peter M, MD;  Location: [facility] INVASIVE CV LAB;  Service: Cardiovascular;  Laterality: N/A;   TEMPORARY PACEMAKER N/A 05/12/2020   Procedure: TEMPORARY PACEMAKER;  Surgeon: Swaziland, Peter M, MD;  Location: [facility] INVASIVE CV LAB;  Service: Cardiovascular;  Laterality: N/A;   TONSILLECTOMY     TRANSURETHRAL RESECTION OF BLADDER TUMOR N/A 01/26/2015   Procedure: TRANSURETHRAL RESECTION OF BLADDER TUMOR (TURBT);  Surgeon: Malen Gauze, MD;  Location: [facility];  Service: Urology;  Laterality: N/A;   TRANSURETHRAL RESECTION OF BLADDER TUMOR WITH GYRUS (TURBT-GYRUS) N/A 03/02/2015   Procedure: TRANSURETHRAL RESECTION OF BLADDER TUMOR WITH GYRUS (TURBT-GYRUS);  Surgeon: Malen Gauze, MD;  Location: [facility];  Service: Urology;  Laterality: N/A;   TRANSURETHRAL RESECTION OF PROSTATE N/A 01/26/2015   Procedure: TRANSURETHRAL RESECTION OF THE PROSTATE WITH GYRUS INSTRUMENTS;  Surgeon: Malen Gauze, MD;  Location: [facility];  Service: Urology;  Laterality: N/A;    Social History   Socioeconomic History   Marital status: Widowed    Spouse name: Not on file   Number of children: Not on file   Years of education: Not on file   Highest education level: Not on file   Occupational History   Not on file  Tobacco Use   Smoking status: Former    Current packs/day: 0.00    Average packs/day: 1 pack/day for 15.0 years (15.0 ttl pk-yrs)    Types: Cigarettes    Start date: 01/11/1956    Quit date: 01/11/1971    Years since quitting: 51.7    Passive exposure: Past   Smokeless tobacco: Never  Vaping Use   Vaping status: Never Used  Substance and Sexual Activity   Alcohol use: Not Currently    Comment: occ.   Drug use: No   Sexual activity: Not Currently  Other Topics Concern   Not on file  Social History Narrative   Married with no children    Exercises 4 times weekly   Caffeine once a day   Social Determinants of Health   Financial Resource Strain: Not on file  Food Insecurity: No Food Insecurity (08/24/2022)   Hunger Vital Sign    Worried About Running Out of Food in the Last Year: Never true    Ran Out of Food in the Last Year: Never true  Transportation Needs: No Transportation Needs (08/24/2022)   PRAPARE - Transportation    Lack of Transportation (  Medical): No    Lack of Transportation (Non-Medical): No  Physical Activity: Not on file  Stress: Not on file  Social Connections: Not on file  Intimate Partner Violence: Not At Risk (08/25/2022)   Humiliation, Afraid, Rape, and Kick questionnaire    Fear of Current or Ex-Partner: No    Emotionally Abused: No    Physically Abused: No    Sexually Abused: No   Family History  Problem Relation Age of Onset   Asthma Mother    Heart attack Father 48   Diabetes Brother       VITAL SIGNS BP 127/82   Pulse 78   Temp 97.6 F (36.4 C)   Resp 20   Ht 6' (1.829 m)   Wt 206 lb (93.4 kg)   SpO2 98%   BMI 27.94 kg/m   Outpatient Encounter Medications as of 10/05/2022  Medication Sig   divalproex (DEPAKOTE) 250 MG DR tablet Take 250 mg by mouth 2 (two) times daily.   furosemide (LASIX) 20 MG tablet Take 20 mg by mouth daily.   loperamide (IMODIUM) 2 MG capsule Take 1 capsule (2 mg total) by  mouth as needed for diarrhea or loose stools.   LORazepam (ATIVAN) 2 MG/ML concentrated solution Place 0.3 mLs (0.6 mg total) under the tongue every 6 (six) hours as needed for anxiety. The dose is 0.5 mg   melatonin 3 MG TABS tablet Take 1 tablet (3 mg total) by mouth at bedtime.   Morphine Sulfate (MORPHINE CONCENTRATE) 10 MG/0.5ML SOLN concentrated solution Place 0.25 mLs (5 mg total) under the tongue every 2 (two) hours as needed for moderate pain (or dyspnea).   temazepam (RESTORIL) 15 MG capsule Take 1 capsule (15 mg total) by mouth at bedtime as needed for sleep.   traMADol (ULTRAM) 50 MG tablet Take 0.5 tablets (25 mg total) by mouth 2 (two) times daily.   No facility-administered encounter medications on file as of 10/05/2022.     SIGNIFICANT DIAGNOSTIC EXAMS  08-20-22: wbc 11.8; hgb 12.3; hct 35.5; mcv 96.2 plt 114; glucose 105; bun 56; creat 3.78; k+ 5.2; na++ 134; ca 8.6; gfr 15; protein 7.2; albumin 4.2 POC +; blood culture: no growth 08-21-22: BNP 2520.5 08-22-22: wbc 13.1; hgb 11.9; hct 35.5; mcv 93.7 plt 96; glucose 105; bun 67; creat 2.93; k+ 4.1; na++ 128; ca 8.4; gfr 21; hgb A1c 6.0; tsh 1.499; chol 132; ldl 73; trig 45; hdl 50; mag 2.0 08-29-22: wbc 9.3; hgb 10.7; hct 31.5; mcv 89.0 plt 91; glucose 351;bun 66; creat 2.30; k+ 3.6; na++ 117; ca 7.8; gfr 28; protein 5.6 albumin 2.9; vitamin B12: 741; phos 3.4 iron 64; tibc 256; ferritin 48 08-30-22: wbc 12.7; hgb 11.4; hct 33.7; mcv 89.4 plt 85  Review of Systems  Constitutional:  Negative for malaise/fatigue.  Respiratory:  Negative for cough and shortness of breath.   Cardiovascular:  Negative for chest pain, palpitations and leg swelling.  Gastrointestinal:  Negative for abdominal pain, constipation and heartburn.  Musculoskeletal:  Negative for back pain, joint pain and myalgias.  Skin: Negative.   Neurological:  Negative for dizziness.  Psychiatric/Behavioral:  The patient is not nervous/anxious.    Physical  Exam Constitutional:      General: He is not in acute distress.    Appearance: He is well-developed. He is not diaphoretic.  Neck:     Thyroid: No thyromegaly.  Cardiovascular:     Rate and Rhythm: Normal rate and regular rhythm.     Pulses:  Normal pulses.     Heart sounds: Murmur heard.     Comments: Pace maker Pulmonary:     Effort: Pulmonary effort is normal. No respiratory distress.     Breath sounds: Normal breath sounds.  Abdominal:     General: Bowel sounds are normal. There is distension.     Palpations: Abdomen is soft.     Tenderness: There is no abdominal tenderness.  Musculoskeletal:        General: Normal range of motion.     Cervical back: Neck supple.  Lymphadenopathy:     Cervical: No cervical adenopathy.  Skin:    General: Skin is warm and dry.  Neurological:     Mental Status: He is alert and oriented to person, place, and time.  Psychiatric:        Mood and Affect: Mood normal.        ASSESSMENT/ PLAN:  TODAY  chronic sytolic CHF (congestive heart failure) / biventricular CHF (congestive heart failure / cardiomyopathy unspecified type Hypertensive heart and kidney disease with biventricular heart failure and stage 3a chronic kidney disease Thrombocytopenia  PREVIOUS   Atrial fibrillation chronic: has pace maker Mixed hyperlipidemia Bipolar affective disorder remission status unspecified: will continue depakote 250 mg twice daily  Protein calorie malnutrition severe   The focus of his care is for comfort. Will continue current regimen and will continue to monitor his status.     Synthia Innocent NP [facility]  call [phone]

## 2022-10-06 ENCOUNTER — Other Ambulatory Visit: Payer: Self-pay | Admitting: Adult Health

## 2022-10-11 ENCOUNTER — Other Ambulatory Visit: Payer: Self-pay | Admitting: Adult Health

## 2022-10-11 MED ORDER — TEMAZEPAM 15 MG PO CAPS: 15.0000 mg | ORAL_CAPSULE | Freq: Every evening | ORAL | 0 refills | Status: DC | PRN

## 2022-10-18 DIAGNOSIS — Z23 Encounter for immunization: Secondary | ICD-10-CM | POA: Diagnosis not present

## 2022-10-24 ENCOUNTER — Other Ambulatory Visit: Payer: Self-pay | Admitting: Adult Health

## 2022-10-24 MED ORDER — TRAMADOL HCL 50 MG PO TABS: 25.0000 mg | ORAL_TABLET | Freq: Two times a day (BID) | ORAL | 0 refills | Status: DC

## 2022-10-27 ENCOUNTER — Non-Acute Institutional Stay (SKILLED_NURSING_FACILITY): Admitting: Adult Health

## 2022-10-27 ENCOUNTER — Encounter: Payer: Self-pay | Admitting: Adult Health

## 2022-10-27 DIAGNOSIS — I5023 Acute on chronic systolic (congestive) heart failure: Secondary | ICD-10-CM | POA: Diagnosis not present

## 2022-10-27 NOTE — Progress Notes (Signed)
Location:  [facility] Room Number: 125 Place of Service:  SNF (31)   CODE STATUS: dnr   Allergies  Allergen Reactions   Spironolactone Other (See Comments)    Hyperkalemia    Chief Complaint  Patient presents with   Acute Visit    Change in status     HPI:  Staff report that he is more short of breath and has increased lower extremity edema. He is presently taking lasix 20 mg daily. He is followed by hospice care. He denies any chest pain. He is restless.    Past Medical History:  Diagnosis Date   Allergic rhinitis    Arthritis    Asthma    Childhood   Atrial fibrillation (HCC)    Remote history - WARCEF   Bladder cancer (HCC)    BPH (benign prostatic hyperplasia)    Cardiomyopathy (HCC)    CHF (congestive heart failure) (HCC)    a. EF 30-35% by echo in 2018 and 10/2018, at 25-30% in 05/2020   Coronary atherosclerosis of native coronary artery    Multivessel s/p CABG in 2002 post-IMI, LVEF 35% up to 50% postoperatively;   Depression    Difficulty sleeping    Erectile dysfunction    Essential hypertension    Frequency of urination    History of bipolar disorder    Hyperlipidemia    IBS (irritable bowel syndrome)    Myocardial infarction (HCC) 2002    Past Surgical History:  Procedure Laterality Date   BIOPSY  03/28/2018   Procedure: BIOPSY;  Surgeon: Malissa Hippo, MD;  [facility];  Service: Endoscopy;;  esophagus   BIOPSY  03/09/2021   Procedure: BIOPSY;  Surgeon: Dolores Frame, MD;  [facility];  Service: Gastroenterology;;   BIV PACEMAKER INSERTION CRT-P N/A 05/12/2020   Procedure: BIV PACEMAKER INSERTION CRT-P;  Surgeon: Hillis Range, MD;  Location: [facility];  Service: Cardiovascular;  Laterality: N/A;   CATARACT EXTRACTION W/PHACO Left 12/29/2014   Procedure: CATARACT EXTRACTION PHACO AND INTRAOCULAR LENS PLACEMENT (IOC);  Surgeon: Susa Simmonds, MD;  Location: [facility];   Service: Ophthalmology;  Laterality: Left;  CDE:3.71   CATARACT EXTRACTION W/PHACO Right 03/30/2015   Procedure: CATARACT EXTRACTION PHACO AND INTRAOCULAR LENS PLACEMENT RIGHT EYE CDE=2.56;  Surgeon: Susa Simmonds, MD;  Location: [facility];  Service: Ophthalmology;  Laterality: Right;   CIRCUMCISION  10/11/2003   COLONOSCOPY N/A 07/11/2012   rehman,transverse colon polyp (benign lymphoid); tortuous colon; prep adequate with much suction/lavage; hemorrhoids.   CORONARY ARTERY BYPASS GRAFT  08/10/2000   Dr. Tyrone Sage - LIMA to LAD, SVG to diagonal, SVG to PDA TRIPLE BYPASS   CYSTOSCOPY W/ RETROGRADES Bilateral 03/02/2015   Procedure: CYSTOSCOPY WITH RIGHT RETROGRADE PYELOGRAM,  ATTEMPTED LEFT RETROGRADE PYELOGRAM;  Surgeon: Malen Gauze, MD;  Location: [facility];  Service: Urology;  Laterality: Bilateral;   ESOPHAGEAL DILATION N/A 03/28/2018   Procedure: ESOPHAGEAL DILATION;  Surgeon: Malissa Hippo, MD;  [facility];  Service: Endoscopy;  Laterality: N/A;   ESOPHAGEAL DILATION N/A 03/15/2022   Procedure: ESOPHAGEAL DILATION;  Surgeon: Dolores Frame, MD;  [facility];  Service: Gastroenterology;  Laterality: N/A;   ESOPHAGOGASTRODUODENOSCOPY N/A 03/28/2018   rehman,Abnormal esophageal motility. mild schatzki ring at GEJ, dilated, patch of salmon colored mucosa at distal esophagus. Barrett's esophagus. 2cm HH. erosive gastropathy, normal pylorus, duodenal erosions w/o bleeding. normal second portion of duodenum   ESOPHAGOGASTRODUODENOSCOPY (EGD) WITH  PROPOFOL N/A 03/09/2021   Procedure: ESOPHAGOGASTRODUODENOSCOPY (EGD) WITH PROPOFOL;  Surgeon: Dolores Frame, MD;  [facility];  Service: Gastroenterology;  Laterality: N/A;  945   ESOPHAGOGASTRODUODENOSCOPY (EGD) WITH PROPOFOL N/A 03/15/2022   Procedure: ESOPHAGOGASTRODUODENOSCOPY (EGD) WITH PROPOFOL;  Surgeon: Dolores Frame, MD;  [facility];  Service: Gastroenterology;   Laterality: N/A;  1045am, asa 3   RIGHT/LEFT HEART CATH AND CORONARY/GRAFT ANGIOGRAPHY N/A 08/26/2022   Procedure: RIGHT/LEFT HEART CATH AND CORONARY/GRAFT ANGIOGRAPHY;  Surgeon: Swaziland, Peter M, MD;  Location: [facility] INVASIVE CV LAB;  Service: Cardiovascular;  Laterality: N/A;   TEMPORARY PACEMAKER N/A 05/12/2020   Procedure: TEMPORARY PACEMAKER;  Surgeon: Swazil[place], Peter M, MD;  Location: [facility] INVASIVE CV LAB;  Service: Cardiovascular;  Laterality: N/A;   TONSILLECTOMY     TRANSURETHRAL RESECTION OF BLADDER TUMOR N/A 01/26/2015   Procedure: TRANSURETHRAL RESECTION OF BLADDER TUMOR (TURBT);  Surgeon: Malen Gauze, MD;  Location: [facility];  Service: Urology;  Laterality: N/A;   TRANSURETHRAL RESECTION OF BLADDER TUMOR WITH GYRUS (TURBT-GYRUS) N/A 03/02/2015   Procedure: TRANSURETHRAL RESECTION OF BLADDER TUMOR WITH GYRUS (TURBT-GYRUS);  Surgeon: Malen Gauze, MD;  Location: [facility];  Service: Urology;  Laterality: N/A;   TRANSURETHRAL RESECTION OF PROSTATE N/A 01/26/2015   Procedure: TRANSURETHRAL RESECTION OF THE PROSTATE WITH GYRUS INSTRUMENTS;  Surgeon: Malen Gauze, MD;  Location: [facility];  Service: Urology;  Laterality: N/A;    Social History   Socioeconomic History   Marital status: Widowed    Spouse name: Not on file   Number of children: Not on file   Years of education: Not on file   Highest education level: Not on file  Occupational History   Not on file  Tobacco Use   Smoking status: Former    Current packs/day: 0.00    Average packs/day: 1 pack/day for 15.0 years (15.0 ttl pk-yrs)    Types: Cigarettes    Start date: 01/11/1956    Quit date: 01/11/1971    Years since quitting: 51.8    Passive exposure: Past   Smokeless tobacco: Never  Vaping Use   Vaping status: Never Used  Substance and Sexual Activity   Alcohol use: Not Currently    Comment: occ.   Drug use: No   Sexual activity: Not Currently  Other Topics Concern   Not on file  Social History Narrative    Married with no children    Exercises 4 times weekly   Caffeine once a day   Social Determinants of Health   Financial Resource Strain: Not on file  Food Insecurity: No Food Insecurity (08/24/2022)   Hunger Vital Sign    Worried About Running Out of Food in the Last Year: Never true    Ran Out of Food in the Last Year: Never true  Transportation Needs: No Transportation Needs (08/24/2022)   PRAPARE - Administrator, Civil Service (Medical): No    Lack of Transportation (Non-Medical): No  Physical Activity: Not on file  Stress: Not on file  Social Connections: Not on file  Intimate Partner Violence: Not At Risk (08/25/2022)   Humiliation, Afraid, Rape, and Kick questionnaire    Fear of Current or Ex-Partner: No    Emotionally Abused: No    Physically Abused: No    Sexually Abused: No   Family History  Problem Relation Age of Onset   Asthma Mother    Heart attack Father 91   Diabetes Brother  VITAL SIGNS BP 120/62   Pulse 78   Temp 97.6 F (36.4 C)   Resp (!) 24   Ht 6' (1.829 m)   Wt 206 lb 6.4 oz (93.6 kg)   SpO2 96%   BMI 27.99 kg/m   Outpatient Encounter Medications as of 10/27/2022  Medication Sig   divalproex (DEPAKOTE) 250 MG DR tablet Take 250 mg by mouth 2 (two) times daily.   furosemide (LASIX) 20 MG tablet Take 20 mg by mouth daily.   loperamide (IMODIUM) 2 MG capsule Take 1 capsule (2 mg total) by mouth as needed for diarrhea or loose stools.   LORazepam (ATIVAN) 2 MG/ML concentrated solution Place 0.3 mLs (0.6 mg total) under the tongue every 6 (six) hours as needed for anxiety. The dose is 0.5 mg   melatonin 3 MG TABS tablet Take 1 tablet (3 mg total) by mouth at bedtime.   Morphine Sulfate (MORPHINE CONCENTRATE) 10 MG/0.5ML SOLN concentrated solution Place 0.25 mLs (5 mg total) under the tongue every 2 (two) hours as needed for moderate pain (or dyspnea).   temazepam (RESTORIL) 15 MG capsule Take 1 capsule (15 mg total) by mouth at  bedtime as needed for sleep.   traMADol (ULTRAM) 50 MG tablet Take 0.5 tablets (25 mg total) by mouth 2 (two) times daily.   No facility-administered encounter medications on file as of 10/27/2022.     SIGNIFICANT DIAGNOSTIC EXAMS  08-20-22: wbc 11.8; hgb 12.3; hct 35.5; mcv 96.2 plt 114; glucose 105; bun 56; creat 3.78; k+ 5.2; na++ 134; ca 8.6; gfr 15; protein 7.2; albumin 4.2 POC +; blood culture: no growth 08-21-22: BNP 2520.5 08-22-22: wbc 13.1; hgb 11.9; hct 35.5; mcv 93.7 plt 96; glucose 105; bun 67; creat 2.93; k+ 4.1; na++ 128; ca 8.4; gfr 21; hgb A1c 6.0; tsh 1.499; chol 132; ldl 73; trig 45; hdl 50; mag 2.0 08-29-22: wbc 9.3; hgb 10.7; hct 31.5; mcv 89.0 plt 91; glucose 351;bun 66; creat 2.30; k+ 3.6; na++ 117; ca 7.8; gfr 28; protein 5.6 albumin 2.9; vitamin B12: 741; phos 3.4 iron 64; tibc 256; ferritin 48 08-30-22: wbc 12.7; hgb 11.4; hct 33.7; mcv 89.4 plt 85  Review of Systems  Constitutional:  Positive for malaise/fatigue.  Respiratory:  Positive for shortness of breath. Negative for cough.   Cardiovascular:  Positive for leg swelling. Negative for chest pain and palpitations.  Gastrointestinal:  Negative for abdominal pain, constipation and heartburn.  Musculoskeletal:  Negative for back pain, joint pain and myalgias.  Skin: Negative.   Neurological:  Negative for dizziness.  Psychiatric/Behavioral:  The patient is not nervous/anxious.     Physical Exam Constitutional:      General: He is in acute distress.     Appearance: He is well-developed. He is ill-appearing. He is not diaphoretic.  Neck:     Thyroid: No thyromegaly.  Cardiovascular:     Rate and Rhythm: Normal rate and regular rhythm.     Pulses: Normal pulses.     Heart sounds: Murmur heard.  Pulmonary:     Effort: Pulmonary effort is normal. No respiratory distress.     Breath sounds: Rhonchi present.     Comments: Pacemaker  Abdominal:     General: Bowel sounds are normal. There is no distension.      Palpations: Abdomen is soft.     Tenderness: There is no abdominal tenderness.  Musculoskeletal:        General: Normal range of motion.     Cervical back:  Neck supple.     Right lower leg: Edema present.     Left lower leg: Edema present.  Lymphadenopathy:     Cervical: No cervical adenopathy.  Skin:    General: Skin is warm and dry.  Neurological:     Mental Status: He is alert. Mental status is at baseline.  Psychiatric:        Mood and Affect: Mood normal.     ASSESSMENT/ PLAN:  TODAY  Acute on chronic systolic CHF (congestive heart failure) will give an extra lasix 40 mg daily for 3 days will then check bmp/bnp on 10-31-22   Synthia Innocent NP [facility]  call [phone]

## 2022-10-31 ENCOUNTER — Encounter: Payer: Self-pay | Admitting: Adult Health

## 2022-10-31 ENCOUNTER — Other Ambulatory Visit (HOSPITAL_COMMUNITY)
Admission: RE | Admit: 2022-10-31 | Discharge: 2022-10-31 | Disposition: A | Discharge status: Home / Self Care | Source: Skilled Nursing Facility | Attending: Adult Health | Admitting: Adult Health

## 2022-10-31 ENCOUNTER — Non-Acute Institutional Stay (SKILLED_NURSING_FACILITY): Payer: Self-pay | Admitting: Adult Health

## 2022-10-31 DIAGNOSIS — I5023 Acute on chronic systolic (congestive) heart failure: Secondary | ICD-10-CM

## 2022-10-31 DIAGNOSIS — Z23 Encounter for immunization: Secondary | ICD-10-CM | POA: Diagnosis not present

## 2022-10-31 LAB — BASIC METABOLIC PANEL
Anion gap: 10 (ref 5–15)
BUN: 23 mg/dL (ref 8–23)
CO2: 31 mmol/L (ref 22–32)
Calcium: 8.4 mg/dL — ABNORMAL LOW (ref 8.9–10.3)
Chloride: 90 mmol/L — ABNORMAL LOW (ref 98–111)
Creatinine, Ser: 1.31 mg/dL — ABNORMAL HIGH (ref 0.61–1.24)
GFR, Estimated: 54 mL/min — ABNORMAL LOW (ref >60)
Glucose, Bld: 73 mg/dL (ref 70–99)
Potassium: 3.8 mmol/L (ref 3.5–5.1)
Sodium: 131 mmol/L — ABNORMAL LOW (ref 135–145)

## 2022-10-31 LAB — BRAIN NATRIURETIC PEPTIDE: B Natriuretic Peptide: 1686 pg/mL — ABNORMAL HIGH (ref 0.0–100.0)

## 2022-10-31 NOTE — Progress Notes (Signed)
Location:  [facility] Room Number: 125 Place of Service:  SNF (31)   CODE STATUS: dnr   Allergies  Allergen Reactions   Spironolactone Other (See Comments)    Hyperkalemia    Chief Complaint  Patient presents with   Acute Visit    Follow up edema     HPI:  He continues to be followed by hospice care. He was seen at the end of last week for worsening shortness of breath; and edema. Today he states that he is feeling better. He is not short of breath at rest but does have some with activity; which is his baseline. She continues to edema now 1-2+. His feet are slightly red without obvious signs of infection present. There are no reports of fevers present.   Past Medical History:  Diagnosis Date   Allergic rhinitis    Arthritis    Asthma    Childhood   Atrial fibrillation (HCC)    Remote history - WARCEF   Bladder cancer (HCC)    BPH (benign prostatic hyperplasia)    Cardiomyopathy (HCC)    CHF (congestive heart failure) (HCC)    a. EF 30-35% by echo in 2018 and 10/2018, at 25-30% in 05/2020   Coronary atherosclerosis of native coronary artery    Multivessel s/p CABG in 2002 post-IMI, LVEF 35% up to 50% postoperatively;   Depression    Difficulty sleeping    Erectile dysfunction    Essential hypertension    Frequency of urination    History of bipolar disorder    Hyperlipidemia    IBS (irritable bowel syndrome)    Myocardial infarction (HCC) 2002    Past Surgical History:  Procedure Laterality Date   BIOPSY  03/28/2018   Procedure: BIOPSY;  Surgeon: Malissa Hippo, MD;  Location: [facility];  Service: Endoscopy;;  esophagus   BIOPSY  03/09/2021   Procedure: BIOPSY;  Surgeon: Dolores Frame, MD;  Location: [facility];  Service: Gastroenterology;;   BIV PACEMAKER INSERTION CRT-P N/A 05/12/2020   Procedure: BIV PACEMAKER INSERTION CRT-P;  Surgeon: Hillis Range, MD;  Location: [facility];  Service: Cardiovascular;   Laterality: N/A;   CATARACT EXTRACTION W/PHACO Left 12/29/2014   Procedure: CATARACT EXTRACTION PHACO AND INTRAOCULAR LENS PLACEMENT (IOC);  Surgeon: Susa Simmonds, MD;  Location: [facility];  Service: Ophthalmology;  Laterality: Left;  CDE:3.71   CATARACT EXTRACTION W/PHACO Right 03/30/2015   Procedure: CATARACT EXTRACTION PHACO AND INTRAOCULAR LENS PLACEMENT RIGHT EYE CDE=2.56;  Surgeon: Susa Simmonds, MD;  Location: [facility];  Service: Ophthalmology;  Laterality: Right;   CIRCUMCISION  10/11/2003   COLONOSCOPY N/A 07/11/2012   rehman,transverse colon polyp (benign lymphoid); tortuous colon; prep adequate with much suction/lavage; hemorrhoids.   CORONARY ARTERY BYPASS GRAFT  08/10/2000   Dr. Tyrone Sage - LIMA to LAD, SVG to diagonal, SVG to PDA TRIPLE BYPASS   CYSTOSCOPY W/ RETROGRADES Bilateral 03/02/2015   Procedure: CYSTOSCOPY WITH RIGHT RETROGRADE PYELOGRAM,  ATTEMPTED LEFT RETROGRADE PYELOGRAM;  Surgeon: Malen Gauze, MD;  Location: [facility];  Service: Urology;  Laterality: Bilateral;   ESOPHAGEAL DILATION N/A 03/28/2018   Procedure: ESOPHAGEAL DILATION;  Surgeon: Malissa Hippo, MD;  Location: [facility];  Service: Endoscopy;  Laterality: N/A;   ESOPHAGEAL DILATION N/A 03/15/2022   Procedure: ESOPHAGEAL DILATION;  Surgeon: Dolores Frame, MD;  Location: [facility];  Service: Gastroenterology;  Laterality: N/A;   ESOPHAGOGASTRODUODENOSCOPY N/A 03/28/2018   rehman,Abnormal esophageal motility.  mild schatzki ring at GEJ, dilated, patch of salmon colored mucosa at distal esophagus. Barrett's esophagus. 2cm HH. erosive gastropathy, normal pylorus, duodenal erosions w/o bleeding. normal second portion of duodenum   ESOPHAGOGASTRODUODENOSCOPY (EGD) WITH PROPOFOL N/A 03/09/2021   Procedure: ESOPHAGOGASTRODUODENOSCOPY (EGD) WITH PROPOFOL;  Surgeon: Dolores Frame, MD;  [facility];  Service: Gastroenterology;  Laterality: N/A;  945    ESOPHAGOGASTRODUODENOSCOPY (EGD) WITH PROPOFOL N/A 03/15/2022   Procedure: ESOPHAGOGASTRODUODENOSCOPY (EGD) WITH PROPOFOL;  Surgeon: Dolores Frame, MD;  [facility]: [facility];  Service: Gastroenterology;  Laterality: N/A;  1045am, asa 3   RIGHT/LEFT HEART CATH AND CORONARY/GRAFT ANGIOGRAPHY N/A 08/26/2022   Procedure: RIGHT/LEFT HEART CATH AND CORONARY/GRAFT ANGIOGRAPHY;  Surgeon: Swaziland, Peter M, MD;  Location: [facility] INVASIVE CV LAB;  Service: Cardiovascular;  Laterality: N/A;   TEMPORARY PACEMAKER N/A 05/12/2020   Procedure: TEMPORARY PACEMAKER;  Surgeon: Swaziland, Peter M, MD;  Location: [facility] INVASIVE CV LAB;  Service: Cardiovascular;  Laterality: N/A;   TONSILLECTOMY     TRANSURETHRAL RESECTION OF BLADDER TUMOR N/A 01/26/2015   Procedure: TRANSURETHRAL RESECTION OF BLADDER TUMOR (TURBT);  Surgeon: Malen Gauze, MD;  Location: [facility];  Service: Urology;  Laterality: N/A;   TRANSURETHRAL RESECTION OF BLADDER TUMOR WITH GYRUS (TURBT-GYRUS) N/A 03/02/2015   Procedure: TRANSURETHRAL RESECTION OF BLADDER TUMOR WITH GYRUS (TURBT-GYRUS);  Surgeon: Malen Gauze, MD;  Location: [facility];  Service: Urology;  Laterality: N/A;   TRANSURETHRAL RESECTION OF PROSTATE N/A 01/26/2015   Procedure: TRANSURETHRAL RESECTION OF THE PROSTATE WITH GYRUS INSTRUMENTS;  Surgeon: Malen Gauze, MD;  Location: [facility];  Service: Urology;  Laterality: N/A;    Social History   Socioeconomic History   Marital status: Widowed    Spouse name: Not on file   Number of children: Not on file   Years of education: Not on file   Highest education level: Not on file  Occupational History   Not on file  Tobacco Use   Smoking status: Former    Current packs/day: 0.00    Average packs/day: 1 pack/day for 15.0 years (15.0 ttl pk-yrs)    Types: Cigarettes    Start date: 01/11/1956    Quit date: 01/11/1971    Years since quitting: 51.8    Passive exposure: Past   Smokeless tobacco: Never  Vaping Use   Vaping  status: Never Used  Substance and Sexual Activity   Alcohol use: Not Currently    Comment: occ.   Drug use: No   Sexual activity: Not Currently  Other Topics Concern   Not on file  Social History Narrative   Married with no children    Exercises 4 times weekly   Caffeine once a day   Social Determinants of Health   Financial Resource Strain: Not on file  Food Insecurity: No Food Insecurity (08/24/2022)   Hunger Vital Sign    Worried About Running Out of Food in the Last Year: Never true    Ran Out of Food in the Last Year: Never true  Transportation Needs: No Transportation Needs (08/24/2022)   PRAPARE - Administrator, Civil Service (Medical): No    Lack of Transportation (Non-Medical): No  Physical Activity: Not on file  Stress: Not on file  Social Connections: Not on file  Intimate Partner Violence: Not At Risk (08/25/2022)   Humiliation, Afraid, Rape, and Kick questionnaire    Fear of Current or Ex-Partner: No    Emotionally Abused: No    Physically  Abused: No    Sexually Abused: No   Family History  Problem Relation Age of Onset   Asthma Mother    Heart attack Father 59   Diabetes Brother       VITAL SIGNS BP 124/88   Pulse 76   Temp 97.7 F (36.5 C)   Resp 20   Ht 6' (1.829 m)   Wt 206 lb 6.4 oz (93.6 kg)   SpO2 98%   BMI 27.99 kg/m   Outpatient Encounter Medications as of 10/31/2022  Medication Sig   divalproex (DEPAKOTE) 250 MG DR tablet Take 250 mg by mouth 2 (two) times daily.   furosemide (LASIX) 20 MG tablet Take 20 mg by mouth daily.   loperamide (IMODIUM) 2 MG capsule Take 1 capsule (2 mg total) by mouth as needed for diarrhea or loose stools.   LORazepam (ATIVAN) 2 MG/ML concentrated solution Place 0.3 mLs (0.6 mg total) under the tongue every 6 (six) hours as needed for anxiety. The dose is 0.5 mg   melatonin 3 MG TABS tablet Take 1 tablet (3 mg total) by mouth at bedtime.   Morphine Sulfate (MORPHINE CONCENTRATE) 10 MG/0.5ML SOLN  concentrated solution Place 0.25 mLs (5 mg total) under the tongue every 2 (two) hours as needed for moderate pain (or dyspnea).   temazepam (RESTORIL) 15 MG capsule Take 1 capsule (15 mg total) by mouth at bedtime as needed for sleep.   traMADol (ULTRAM) 50 MG tablet Take 0.5 tablets (25 mg total) by mouth 2 (two) times daily.   No facility-administered encounter medications on file as of 10/31/2022.     SIGNIFICANT DIAGNOSTIC EXAMS  08-20-22: wbc 11.8; hgb 12.3; hct 35.5; mcv 96.2 plt 114; glucose 105; bun 56; creat 3.78; k+ 5.2; na++ 134; ca 8.6; gfr 15; protein 7.2; albumin 4.2 POC +; blood culture: no growth 08-21-22: BNP 2520.5 08-22-22: wbc 13.1; hgb 11.9; hct 35.5; mcv 93.7 plt 96; glucose 105; bun 67; creat 2.93; k+ 4.1; na++ 128; ca 8.4; gfr 21; hgb A1c 6.0; tsh 1.499; chol 132; ldl 73; trig 45; hdl 50; mag 2.0 08-29-22: wbc 9.3; hgb 10.7; hct 31.5; mcv 89.0 plt 91; glucose 351;bun 66; creat 2.30; k+ 3.6; na++ 117; ca 7.8; gfr 28; protein 5.6 albumin 2.9; vitamin B12: 741; phos 3.4 iron 64; tibc 256; ferritin 48 08-30-22: wbc 12.7; hgb 11.4; hct 33.7; mcv 89.4 plt 85  TODAY  09-26-22: glucose 91; bun 26; creat 1.41; k+ 4.1; na++ 133; ca 8.6; gfr 50 10-31-22: glucose 73; bun 23; creat 1.31; k+ 3.8; na++ 131; ca 8.4; gfr 54; BNP 1686.0  Review of Systems  Constitutional:  Negative for malaise/fatigue.  Respiratory:  Negative for cough and shortness of breath.   Cardiovascular:  Positive for leg swelling. Negative for chest pain and palpitations.  Gastrointestinal:  Negative for abdominal pain, constipation and heartburn.  Musculoskeletal:  Negative for back pain, joint pain and myalgias.  Skin: Negative.   Neurological:  Negative for dizziness.  Psychiatric/Behavioral:  The patient is not nervous/anxious.    Physical Exam Constitutional:      General: He is not in acute distress.    Appearance: He is well-developed. He is not diaphoretic.  Neck:     Thyroid: No thyromegaly.   Cardiovascular:     Rate and Rhythm: Normal rate and regular rhythm.     Pulses: Normal pulses.     Heart sounds: Murmur heard.     Comments: Pacemaker  Pulmonary:     Effort:  Pulmonary effort is normal. No respiratory distress.     Breath sounds: Normal breath sounds.  Abdominal:     General: Bowel sounds are normal. There is no distension.     Palpations: Abdomen is soft.     Tenderness: There is no abdominal tenderness.  Musculoskeletal:        General: Normal range of motion.     Cervical back: Neck supple.     Right lower leg: Edema present.     Left lower leg: Edema present.     Comments: 1-2+ bilateral pedal edema. Feet slightly pink without signs of infection present   Lymphadenopathy:     Cervical: No cervical adenopathy.  Skin:    General: Skin is warm and dry.  Neurological:     Mental Status: He is alert. Mental status is at baseline.  Psychiatric:        Mood and Affect: Mood normal.     ASSESSMENT/ PLAN:  TODAY  Acute on chronic systolic CHF (congestive heart failure) is improving; will increase lasix to 40 mg daily and will repeat bmp in one week. Will monitor his status.    Synthia Innocent NP [facility]  call [phone]

## 2022-11-03 ENCOUNTER — Non-Acute Institutional Stay (SKILLED_NURSING_FACILITY): Payer: Medicare HMO | Admitting: Adult Health

## 2022-11-03 ENCOUNTER — Encounter: Payer: Self-pay | Admitting: Adult Health

## 2022-11-03 DIAGNOSIS — Z0000 Encounter for general adult medical examination without abnormal findings: Secondary | ICD-10-CM

## 2022-11-03 NOTE — Progress Notes (Signed)
Subjective:   Grant Stevens is a 82 y.o. male who presents for Medicare Annual/Subsequent preventive examination.  Visit Complete: In person  Patient Medicare AWV questionnaire was completed by the patient on 11-03-22; I have confirmed that all information answered by patient is correct and no changes since this date.  Cardiac Risk Factors include: advanced age (>16men, >84 women);hypertension;sedentary lifestyle;male gender     Objective:    Today's Vitals   11/03/22 1152 11/03/22 1156  BP: 124/88   Pulse: 76   Resp: 18   Temp: 97.7 F (36.5 C)   SpO2: 98%   Weight: 206 lb 6.4 oz (93.6 kg)   Height: 6' (1.829 m)   PainSc:  4    Body mass index is 27.99 kg/m.     08/20/2022   11:36 AM 03/15/2022   10:01 AM 03/15/2022    9:50 AM 03/14/2022    9:00 AM 02/28/2022   11:47 AM 02/18/2022    4:15 PM 10/30/2021    5:36 AM  Advanced Directives  Does Patient Have a Medical Advance Directive? No Yes No Yes Yes Yes No  Type of Advance Directive  Living will Living will Living will;Healthcare Power of Attorney Living will;Healthcare Power of Attorney    Does patient want to make changes to medical advance directive?    No - Patient declined     Would patient like information on creating a medical advance directive? No - Patient declined No - Patient declined  No - Patient declined No - Patient declined  No - Patient declined    Current Medications (verified) Outpatient Encounter Medications as of 11/03/2022  Medication Sig   divalproex (DEPAKOTE) 250 MG DR tablet Take 250 mg by mouth 2 (two) times daily.   furosemide (LASIX) 40 MG tablet Take 40 mg by mouth daily.   loperamide (IMODIUM) 2 MG capsule Take 1 capsule (2 mg total) by mouth as needed for diarrhea or loose stools.   LORazepam (ATIVAN) 2 MG/ML concentrated solution Place 0.3 mLs (0.6 mg total) under the tongue every 6 (six) hours as needed for anxiety. The dose is 0.5 mg   melatonin 3 MG TABS tablet Take 1 tablet (3 mg  total) by mouth at bedtime.   Morphine Sulfate (MORPHINE CONCENTRATE) 10 MG/0.5ML SOLN concentrated solution Place 0.25 mLs (5 mg total) under the tongue every 2 (two) hours as needed for moderate pain (or dyspnea).   temazepam (RESTORIL) 15 MG capsule Take 1 capsule (15 mg total) by mouth at bedtime as needed for sleep.   traMADol (ULTRAM) 50 MG tablet Take 0.5 tablets (25 mg total) by mouth 2 (two) times daily.   No facility-administered encounter medications on file as of 11/03/2022.    Allergies (verified) Spironolactone   History: Past Medical History:  Diagnosis Date   Allergic rhinitis    Arthritis    Asthma    Childhood   Atrial fibrillation (HCC)    Remote history - WARCEF   Bladder cancer (HCC)    BPH (benign prostatic hyperplasia)    Cardiomyopathy (HCC)    CHF (congestive heart failure) (HCC)    a. EF 30-35% by echo in 2018 and 10/2018, at 25-30% in 05/2020   Coronary atherosclerosis of native coronary artery    Multivessel s/p CABG in 2002 post-IMI, LVEF 35% up to 50% postoperatively;   Depression    Difficulty sleeping    Erectile dysfunction    Essential hypertension    Frequency of urination  History of bipolar disorder    Hyperlipidemia    IBS (irritable bowel syndrome)    Myocardial infarction [facility]) 2002   Past Surgical History:  Procedure Laterality Date   BIOPSY  03/28/2018   Procedure: BIOPSY;  Surgeon: Malissa Hippo, MD;  [facility];  Service: Endoscopy;;  esophagus   BIOPSY  03/09/2021   Procedure: BIOPSY;  Surgeon: Dolores Frame, MD;  [facility]: [facility];  Service: Gastroenterology;;   BIV PACEMAKER INSERTION CRT-P N/A 05/12/2020   Procedure: BIV PACEMAKER INSERTION CRT-P;  Surgeon: Hillis Range, MD;  Location: [facility];  Service: Cardiovascular;  Laterality: N/A;   CATARACT EXTRACTION W/PHACO Left 12/29/2014   Procedure: CATARACT EXTRACTION PHACO AND INTRAOCULAR LENS PLACEMENT (IOC);  Surgeon: Susa Simmonds, MD;  Location: [facility];  Service: Ophthalmology;  Laterality: Left;  CDE:3.71   CATARACT EXTRACTION W/PHACO Right 03/30/2015   Procedure: CATARACT EXTRACTION PHACO AND INTRAOCULAR LENS PLACEMENT RIGHT EYE CDE=2.56;  Surgeon: Susa Simmonds, MD;  Location: [facility];  Service: Ophthalmology;  Laterality: Right;   CIRCUMCISION  10/11/2003   COLONOSCOPY N/A 07/11/2012   rehman,transverse colon polyp (benign lymphoid); tortuous colon; prep adequate with much suction/lavage; hemorrhoids.   CORONARY ARTERY BYPASS GRAFT  08/10/2000   Dr. Tyrone Sage - LIMA to LAD, SVG to diagonal, SVG to PDA TRIPLE BYPASS   CYSTOSCOPY W/ RETROGRADES Bilateral 03/02/2015   Procedure: CYSTOSCOPY WITH RIGHT RETROGRADE PYELOGRAM,  ATTEMPTED LEFT RETROGRADE PYELOGRAM;  Surgeon: Malen Gauze, MD;  Location: [facility];  Service: Urology;  Laterality: Bilateral;   ESOPHAGEAL DILATION N/A 03/28/2018   Procedure: ESOPHAGEAL DILATION;  Surgeon: Malissa Hippo, MD;  [facility];  Service: Endoscopy;  Laterality: N/A;   ESOPHAGEAL DILATION N/A 03/15/2022   Procedure: ESOPHAGEAL DILATION;  Surgeon: Dolores Frame, MD;  [facility];  Service: Gastroenterology;  Laterality: N/A;   ESOPHAGOGASTRODUODENOSCOPY N/A 03/28/2018   rehman,Abnormal esophageal motility. mild schatzki ring at GEJ, dilated, patch of salmon colored mucosa at distal esophagus. Barrett's esophagus. 2cm HH. erosive gastropathy, normal pylorus, duodenal erosions w/o bleeding. normal second portion of duodenum   ESOPHAGOGASTRODUODENOSCOPY (EGD) WITH PROPOFOL N/A 03/09/2021   Procedure: ESOPHAGOGASTRODUODENOSCOPY (EGD) WITH PROPOFOL;  Surgeon: Dolores Frame, MD;  [facility];  Service: Gastroenterology;  Laterality: N/A;  945   ESOPHAGOGASTRODUODENOSCOPY (EGD) WITH PROPOFOL N/A 03/15/2022   Procedure: ESOPHAGOGASTRODUODENOSCOPY (EGD) WITH PROPOFOL;  Surgeon: Dolores Frame, MD;  [facility];  Service: Gastroenterology;  Laterality: N/A;  1045am, asa 3   RIGHT/LEFT HEART CATH AND CORONARY/GRAFT ANGIOGRAPHY N/A 08/26/2022   Procedure: RIGHT/LEFT HEART CATH AND CORONARY/GRAFT ANGIOGRAPHY;  Surgeon: Swaziland, Peter M, MD;  Location: [facility] INVASIVE CV LAB;  Service: Cardiovascular;  Laterality: N/A;   TEMPORARY PACEMAKER N/A 05/12/2020   Procedure: TEMPORARY PACEMAKER;  Surgeon: Swaziland, Peter M, MD;  Location: [facility] INVASIVE CV LAB;  Service: Cardiovascular;  Laterality: N/A;   TONSILLECTOMY     TRANSURETHRAL RESECTION OF BLADDER TUMOR N/A 01/26/2015   Procedure: TRANSURETHRAL RESECTION OF BLADDER TUMOR (TURBT);  Surgeon: Malen Gauze, MD;  Location: [facility];  Service: Urology;  Laterality: N/A;   TRANSURETHRAL RESECTION OF BLADDER TUMOR WITH GYRUS (TURBT-GYRUS) N/A 03/02/2015   Procedure: TRANSURETHRAL RESECTION OF BLADDER TUMOR WITH GYRUS (TURBT-GYRUS);  Surgeon: Malen Gauze, MD;  Location: [facility];  Service: Urology;  Laterality: N/A;   TRANSURETHRAL RESECTION OF PROSTATE N/A 01/26/2015   Procedure: TRANSURETHRAL RESECTION OF THE PROSTATE WITH GYRUS INSTRUMENTS;  Surgeon: Malen Gauze, MD;  Location: [facility];  Service: Urology;  Laterality: N/A;   Family History  Problem Relation Age of Onset   Asthma Mother    Heart attack Father 22   Diabetes Brother    Social History   Socioeconomic History   Marital status: Widowed    Spouse name: Not on file   Number of children: Not on file   Years of education: Not on file   Highest education level: Not on file  Occupational History   Not on file  Tobacco Use   Smoking status: Former    Current packs/day: 0.00    Average packs/day: 1 pack/day for 15.0 years (15.0 ttl pk-yrs)    Types: Cigarettes    Start date: 01/11/1956    Quit date: 01/11/1971    Years since quitting: 51.8    Passive exposure: Past   Smokeless tobacco: Never  Vaping Use   Vaping status: Never Used  Substance and Sexual Activity   Alcohol use:  Not Currently    Comment: occ.   Drug use: No   Sexual activity: Not Currently  Other Topics Concern   Not on file  Social History Narrative   Married with no children    Exercises 4 times weekly   Caffeine once a day   Social Determinants of Health   Financial Resource Strain: Not on file  Food Insecurity: No Food Insecurity (08/24/2022)   Hunger Vital Sign    Worried About Running Out of Food in the Last Year: Never true    Ran Out of Food in the Last Year: Never true  Transportation Needs: No Transportation Needs (08/24/2022)   PRAPARE - Administrator, Civil Service (Medical): No    Lack of Transportation (Non-Medical): No  Physical Activity: Not on file  Stress: Not on file  Social Connections: Not on file    Tobacco Counseling Counseling given: Not Answered   Clinical Intake:  Pre-visit preparation completed: Yes  Pain : 0-10 Pain Score: 4  Pain Type: Chronic pain Pain Location: Back Pain Orientation: Right, Left Pain Descriptors / Indicators: Aching Pain Onset: More than a month ago Pain Frequency: Intermittent     BMI - recorded: 27.99 Nutritional Status: BMI 25 -29 Overweight Nutritional Risks: Unintentional weight loss, Failure to thrive Diabetes: No  How often do you need to have someone help you when you read instructions, pamphlets, or other written materials from your doctor or pharmacy?: 5 - Always  Interpreter Needed?: No      Activities of Daily Living    11/03/2022   11:59 AM 08/26/2022    8:00 PM  In your present state of health, do you have any difficulty performing the following activities:  Hearing? 0   Vision? 0   Difficulty concentrating or making decisions? 1   Walking or climbing stairs? 1   Dressing or bathing? 1   Doing errands, shopping? 1 1  Preparing Food and eating ? Y   Using the Toilet? Y   In the past six months, have you accidently leaked urine? Y   Do you have problems with loss of bowel control? Y    Managing your Medications? Y   Managing your Finances? Y   Housekeeping or managing your Housekeeping? Y     Patient Care Team: Sharee Holster, NP as PCP - General (Geriatric Medicine) Jonelle Sidle, MD as PCP - Cardiology (Cardiology)  Indicate any recent Medical Services you may have  received from other than [facility] providers in the past year (date may be approximate).     Assessment:   This is a routine wellness examination for Pranesh.  Hearing/Vision screen No results found.   Goals Addressed             This Visit's Progress    Absence of Fall and Fall-Related Injury       Evidence-based guidance:  Assess fall risk using a validated tool when available. Consider balance and gait impairment, muscle weakness, diminished vision or hearing, environmental hazards, presence of urinary or bowel urgency and/or incontinence.  Communicate fall injury risk to interprofessional healthcare team.  Develop a fall prevention plan with the patient and family.  Promote use of personal vision and auditory aids.  Promote reorientation, appropriate sensory stimulation, and routines to decrease risk of fall when changes in mental status are present.  Assess assistance level required for safe and effective self-care; consider referral for home care.  Encourage physical activity, such as performance of self-care at highest level of ability, strength and balance exercise program, and provision of appropriate assistive devices; refer to rehabilitation therapy.  Refer to community-based fall prevention program where available.  If fall occurs, determine the cause and revise fall injury prevention plan.  Regularly review medication contribution to fall risk; consider risk related to polypharmacy and age.  Refer to pharmacist for consultation when concerns about medications are revealed.  Balance adequate pain management with potential for oversedation.  Provide guidance related to environmental  modifications.  Consider supplementation with Vitamin D.   Notes:      DIET - INCREASE WATER INTAKE       General - Client will not be readmitted within 30 days (C-SNP)         Depression Screen    11/03/2022   12:01 PM 10/05/2022    2:33 PM 09/21/2015    4:24 PM 09/15/2015    1:36 PM  PHQ 2/9 Scores  PHQ - 2 Score 0 0 0 0  PHQ- 9 Score 0 0      Fall Risk    11/03/2022   12:00 PM 09/21/2015    4:43 PM  Fall Risk   Falls in the past year? 0 No  Number falls in past yr: 0   Injury with Fall? 0   Risk for fall due to : Impaired balance/gait;Impaired mobility   Follow up Falls evaluation completed     MEDICARE RISK AT HOME: Medicare Risk at Home Any stairs in or around the home?: Yes If so, are there any without handrails?: No Home free of loose throw rugs in walkways, pet beds, electrical cords, etc?: Yes Adequate lighting in your home to reduce risk of falls?: Yes Life alert?: No Use of a cane, walker or w/c?: Yes Grab bars in the bathroom?: Yes Shower chair or bench in shower?: Yes Elevated toilet seat or a handicapped toilet?: Yes  TIMED UP AND GO:  Was the test performed?  No    Cognitive Function:    11/03/2022   12:01 PM  MMSE - Mini Mental State Exam  Not completed: Unable to complete        Immunizations Immunization History  Administered Date(s) Administered   Fluad Quad(high Dose 65+) 10/31/2022   Influenza Split 09/11/2010, 10/11/2014   Influenza Whole 10/10/2009   Moderna Sars-Covid-2 Vaccination 11/16/2020, 10/18/2022   PNEUMOCOCCAL CONJUGATE-20 01/25/2010   Pneumococcal-Unspecified 01/25/2010   Zoster Recombinant(Shingrix) 03/20/2018, 09/21/2018   Zoster, Unspecified 03/20/2018, 09/21/2018  TDAP status: Up to date  Flu Vaccine status: Up to date  Pneumococcal vaccine status: Up to date  Covid-19 vaccine status: Completed vaccines  Qualifies for Shingles Vaccine? Yes   Zostavax completed Yes   Shingrix Completed?: No.     Education has been provided regarding the importance of this vaccine. Patient has been advised to call insurance company to determine out of pocket expense if they have not yet received this vaccine. Advised may also receive vaccine at local pharmacy or Health Dept. Verbalized acceptance and understanding.  Screening Tests Health Maintenance  Topic Date Due   COVID-19 Vaccine (3 - Moderna risk series) 11/15/2022   Medicare Annual Wellness (AWV)  11/03/2023   Pneumonia Vaccine 52+ Years old  Completed   INFLUENZA VACCINE  Completed   Zoster Vaccines- Shingrix  Completed   HPV VACCINES  Aged Out   DTaP/Tdap/Td  Discontinued    Health Maintenance  There are no preventive care reminders to display for this patient.  Colorectal cancer screening: No longer required.   Lung Cancer Screening: (Low Dose CT Chest recommended if Age 57-80 years, 20 pack-year currently smoking OR have quit w/in 15years.) does not qualify.   Lung Cancer Screening Referral:   Additional Screening:  Hepatitis C Screening: does not qualify; Completed   Vision Screening: Recommended annual ophthalmology exams for early detection of glaucoma and other disorders of the eye. Is the patient up to date with their annual eye exam?  No  Who is the provider or what is the name of the office in which the patient attends annual eye exams?  If pt is not established with a provider, would they like to be referred to a provider to establish care? No .   Dental Screening: Recommended annual dental exams for proper oral hygiene  Diabetic Foot Exam:   Community Resource Referral / Chronic Care Management: CRR required this visit?  No   CCM required this visit?  Appt scheduled with PCP     Plan:     I have personally reviewed and noted the following in the patient's chart:   Medical and social history Use of alcohol, tobacco or illicit drugs  Current medications and supplements including opioid prescriptions.  Patient is currently taking opioid prescriptions. Information provided to patient regarding non-opioid alternatives. Patient advised to discuss non-opioid treatment plan with their provider. Functional ability and status Nutritional status Physical activity Advanced directives List of other physicians Hospitalizations, surgeries, and ER visits in previous 12 months Vitals Screenings to include cognitive, depression, and falls Referrals and appointments  In addition, I have reviewed and discussed with patient certain preventive protocols, quality metrics, and best practice recommendations. A written personalized care plan for preventive services as well as general preventive health recommendations were provided to patient.     Sharee Holster, NP   11/03/2022   After Visit Summary: (In Person-Declined) Patient declined AVS at this time.  Nurse Notes: this exam was performed by myself at this facility.

## 2022-11-07 ENCOUNTER — Encounter (HOSPITAL_COMMUNITY)
Admission: RE | Admit: 2022-11-07 | Discharge: 2022-11-07 | Disposition: A | Discharge status: Home / Self Care | Source: Skilled Nursing Facility | Attending: Adult Health | Admitting: Adult Health

## 2022-11-07 DIAGNOSIS — I130 Hypertensive heart and chronic kidney disease with heart failure and stage 1 through stage 4 chronic kidney disease, or unspecified chronic kidney disease: Secondary | ICD-10-CM | POA: Diagnosis not present

## 2022-11-07 LAB — BASIC METABOLIC PANEL
Anion gap: 8 (ref 5–15)
BUN: 25 mg/dL — ABNORMAL HIGH (ref 8–23)
CO2: 33 mmol/L — ABNORMAL HIGH (ref 22–32)
Calcium: 8.9 mg/dL (ref 8.9–10.3)
Chloride: 91 mmol/L — ABNORMAL LOW (ref 98–111)
Creatinine, Ser: 1.17 mg/dL (ref 0.61–1.24)
GFR, Estimated: >60 mL/min (ref >60)
Glucose, Bld: 85 mg/dL (ref 70–99)
Potassium: 4.1 mmol/L (ref 3.5–5.1)
Sodium: 132 mmol/L — ABNORMAL LOW (ref 135–145)

## 2022-11-08 ENCOUNTER — Encounter: Payer: Self-pay | Admitting: Adult Health

## 2022-11-08 ENCOUNTER — Non-Acute Institutional Stay (SKILLED_NURSING_FACILITY): Admitting: Adult Health

## 2022-11-08 DIAGNOSIS — I482 Chronic atrial fibrillation, unspecified: Secondary | ICD-10-CM | POA: Diagnosis not present

## 2022-11-08 DIAGNOSIS — E782 Mixed hyperlipidemia: Secondary | ICD-10-CM | POA: Diagnosis not present

## 2022-11-08 DIAGNOSIS — F319 Bipolar disorder, unspecified: Secondary | ICD-10-CM | POA: Diagnosis not present

## 2022-11-08 NOTE — Progress Notes (Unsigned)
Location:  [facility] Room Number: 125 Place of Service:  SNF (31)   CODE STATUS: DNR  Allergies  Allergen Reactions   Spironolactone Other (See Comments)    Hyperkalemia    Chief Complaint  Patient presents with   Medical Management of Chronic Issues    Rounite    HPI:    Past Medical History:  Diagnosis Date   Allergic rhinitis    Arthritis    Asthma    Childhood   Atrial fibrillation (HCC)    Remote history - WARCEF   Bladder cancer (HCC)    BPH (benign prostatic hyperplasia)    Cardiomyopathy (HCC)    CHF (congestive heart failure) (HCC)    a. EF 30-35% by echo in 2018 and 10/2018, at 25-30% in 05/2020   Coronary atherosclerosis of native coronary artery    Multivessel s/p CABG in 2002 post-IMI, LVEF 35% up to 50% postoperatively;   Depression    Difficulty sleeping    Erectile dysfunction    Essential hypertension    Frequency of urination    History of bipolar disorder    Hyperlipidemia    IBS (irritable bowel syndrome)    Myocardial infarction (HCC) 2002    Past Surgical History:  Procedure Laterality Date   BIOPSY  03/28/2018   Procedure: BIOPSY;  Surgeon: Malissa Hippo, MD;  [facility]: [facility];  Service: Endoscopy;;  esophagus   BIOPSY  03/09/2021   Procedure: BIOPSY;  Surgeon: Dolores Frame, MD;  [facility]: [facility];  Service: Gastroenterology;;   BIV PACEMAKER INSERTION CRT-P N/A 05/12/2020   Procedure: BIV PACEMAKER INSERTION CRT-P;  Surgeon: Hillis Range, MD;  [facility]: [facility] INVASIVE CV LAB;  Service: Cardiovascular;  Laterality: N/A;   CATARACT EXTRACTION W/PHACO Left 12/29/2014   Procedure: CATARACT EXTRACTION PHACO AND INTRAOCULAR LENS PLACEMENT (IOC);  Surgeon: Susa Simmonds, MD;  Location: [facility];  Service: Ophthalmology;  Laterality: Left;  CDE:3.71   CATARACT EXTRACTION W/PHACO Right 03/30/2015   Procedure: CATARACT EXTRACTION PHACO AND INTRAOCULAR LENS PLACEMENT RIGHT EYE CDE=2.56;   Surgeon: Susa Simmonds, MD;  Location: [facility];  Service: Ophthalmology;  Laterality: Right;   CIRCUMCISION  10/11/2003   COLONOSCOPY N/A 07/11/2012   rehman,transverse colon polyp (benign lymphoid); tortuous colon; prep adequate with much suction/lavage; hemorrhoids.   CORONARY ARTERY BYPASS GRAFT  08/10/2000   Dr. Tyrone Sage - LIMA to LAD, SVG to diagonal, SVG to PDA TRIPLE BYPASS   CYSTOSCOPY W/ RETROGRADES Bilateral 03/02/2015   Procedure: CYSTOSCOPY WITH RIGHT RETROGRADE PYELOGRAM,  ATTEMPTED LEFT RETROGRADE PYELOGRAM;  Surgeon: Malen Gauze, MD;  Location: [facility];  Service: Urology;  Laterality: Bilateral;   ESOPHAGEAL DILATION N/A 03/28/2018   Procedure: ESOPHAGEAL DILATION;  Surgeon: Malissa Hippo, MD;  [facility]: [facility];  Service: Endoscopy;  Laterality: N/A;   ESOPHAGEAL DILATION N/A 03/15/2022   Procedure: ESOPHAGEAL DILATION;  Surgeon: Dolores Frame, MD;  [facility];  Service: Gastroenterology;  Laterality: N/A;   ESOPHAGOGASTRODUODENOSCOPY N/A 03/28/2018   rehman,Abnormal esophageal motility. mild schatzki ring at GEJ, dilated, patch of salmon colored mucosa at distal esophagus. Barrett's esophagus. 2cm HH. erosive gastropathy, normal pylorus, duodenal erosions w/o bleeding. normal second portion of duodenum   ESOPHAGOGASTRODUODENOSCOPY (EGD) WITH PROPOFOL N/A 03/09/2021   Procedure: ESOPHAGOGASTRODUODENOSCOPY (EGD) WITH PROPOFOL;  Surgeon: Dolores Frame, MD;  [facility];  Service: Gastroenterology;  Laterality: N/A;  945   ESOPHAGOGASTRODUODENOSCOPY (EGD) WITH PROPOFOL N/A 03/15/2022   Procedure:  ESOPHAGOGASTRODUODENOSCOPY (EGD) WITH PROPOFOL;  Surgeon: Marguerita Merles, Reuel Boom, MD;  [facility];  Service: Gastroenterology;  Laterality: N/A;  1045am, asa 3   RIGHT/LEFT HEART CATH AND CORONARY/GRAFT ANGIOGRAPHY N/A 08/26/2022   Procedure: RIGHT/LEFT HEART CATH AND CORONARY/GRAFT ANGIOGRAPHY;  Surgeon: Swaziland,  Peter M, MD;  Location: [facility] INVASIVE CV LAB;  Service: Cardiovascular;  Laterality: N/A;   TEMPORARY PACEMAKER N/A 05/12/2020   Procedure: TEMPORARY PACEMAKER;  Surgeon: Swaziland, Peter M, MD;  Location: [facility] INVASIVE CV LAB;  Service: Cardiovascular;  Laterality: N/A;   TONSILLECTOMY     TRANSURETHRAL RESECTION OF BLADDER TUMOR N/A 01/26/2015   Procedure: TRANSURETHRAL RESECTION OF BLADDER TUMOR (TURBT);  Surgeon: Malen Gauze, MD;  Location: [facility];  Service: Urology;  Laterality: N/A;   TRANSURETHRAL RESECTION OF BLADDER TUMOR WITH GYRUS (TURBT-GYRUS) N/A 03/02/2015   Procedure: TRANSURETHRAL RESECTION OF BLADDER TUMOR WITH GYRUS (TURBT-GYRUS);  Surgeon: Malen Gauze, MD;  Location: [facility];  Service: Urology;  Laterality: N/A;   TRANSURETHRAL RESECTION OF PROSTATE N/A 01/26/2015   Procedure: TRANSURETHRAL RESECTION OF THE PROSTATE WITH GYRUS INSTRUMENTS;  Surgeon: Malen Gauze, MD;  Location: [facility];  Service: Urology;  Laterality: N/A;    Social History   Socioeconomic History   Marital status: Widowed    Spouse name: Not on file   Number of children: Not on file   Years of education: Not on file   Highest education level: Not on file  Occupational History   Not on file  Tobacco Use   Smoking status: Former    Current packs/day: 0.00    Average packs/day: 1 pack/day for 15.0 years (15.0 ttl pk-yrs)    Types: Cigarettes    Start date: 01/11/1956    Quit date: 01/11/1971    Years since quitting: 51.8    Passive exposure: Past   Smokeless tobacco: Never  Vaping Use   Vaping status: Never Used  Substance and Sexual Activity   Alcohol use: Not Currently    Comment: occ.   Drug use: No   Sexual activity: Not Currently  Other Topics Concern   Not on file  Social History Narrative   Married with no children    Exercises 4 times weekly   Caffeine once a day   Social Determinants of Health   Financial Resource Strain: Not on file  Food Insecurity: No Food  Insecurity (08/24/2022)   Hunger Vital Sign    Worried About Running Out of Food in the Last Year: Never true    Ran Out of Food in the Last Year: Never true  Transportation Needs: No Transportation Needs (08/24/2022)   PRAPARE - Administrator, Civil Service (Medical): No    Lack of Transportation (Non-Medical): No  Physical Activity: Not on file  Stress: Not on file  Social Connections: Not on file  Intimate Partner Violence: Not At Risk (08/25/2022)   Humiliation, Afraid, Rape, and Kick questionnaire    Fear of Current or Ex-Partner: No    Emotionally Abused: No    Physically Abused: No    Sexually Abused: No   Family History  Problem Relation Age of Onset   Asthma Mother    Heart attack Father 58   Diabetes Brother       VITAL SIGNS BP 117/76   Pulse 75   Temp (!) 97.2 F (36.2 C)   Resp 20   Ht 6' (1.829 m)   Wt 206 lb 6.4 oz (93.6 kg)  SpO2 100%   BMI 27.99 kg/m   Outpatient Encounter Medications as of 11/08/2022  Medication Sig   divalproex (DEPAKOTE) 250 MG DR tablet Take 250 mg by mouth 2 (two) times daily.   famotidine (PEPCID) 40 MG tablet Take 40 mg by mouth daily.   furosemide (LASIX) 40 MG tablet Take 40 mg by mouth daily.   loperamide (IMODIUM) 2 MG capsule Take 1 capsule (2 mg total) by mouth as needed for diarrhea or loose stools.   LORazepam (ATIVAN) 2 MG/ML concentrated solution Place 0.3 mLs (0.6 mg total) under the tongue every 6 (six) hours as needed for anxiety. The dose is 0.5 mg   melatonin 3 MG TABS tablet Take 1 tablet (3 mg total) by mouth at bedtime.   Morphine Sulfate (MORPHINE CONCENTRATE) 10 MG/0.5ML SOLN concentrated solution Place 0.25 mLs (5 mg total) under the tongue every 2 (two) hours as needed for moderate pain (or dyspnea).   ondansetron (ZOFRAN-ODT) 8 MG disintegrating tablet Take 8 mg by mouth every 4 (four) hours as needed for nausea or vomiting.   temazepam (RESTORIL) 15 MG capsule Take 1 capsule (15 mg total) by  mouth at bedtime as needed for sleep.   traMADol (ULTRAM) 50 MG tablet Take 0.5 tablets (25 mg total) by mouth 2 (two) times daily.   No facility-administered encounter medications on file as of 11/08/2022.     SIGNIFICANT DIAGNOSTIC EXAMS       ASSESSMENT/ PLAN:     Synthia Innocent NP [facility]  Contact [phone] Monday through Friday 8am- 5pm  After hours call [phone]

## 2022-11-09 ENCOUNTER — Ambulatory Visit: Payer: Self-pay

## 2022-11-11 ENCOUNTER — Encounter: Payer: Self-pay | Admitting: Adult Health

## 2022-11-11 ENCOUNTER — Other Ambulatory Visit: Payer: Self-pay | Admitting: Adult Health

## 2022-11-11 MED ORDER — TRAMADOL HCL 50 MG PO TABS: 25.0000 mg | ORAL_TABLET | Freq: Two times a day (BID) | ORAL | 0 refills | Status: DC

## 2022-11-11 NOTE — Progress Notes (Unsigned)
Location:  [facility] Room Number: 125 Place of Service:  SNF (31)   CODE STATUS: DNR  Allergies  Allergen Reactions   Spironolactone Other (See Comments)    Hyperkalemia    Chief Complaint  Patient presents with   Acute Visit    for family concerns    HPI:    Past Medical History:  Diagnosis Date   Allergic rhinitis    Arthritis    Asthma    Childhood   Atrial fibrillation (HCC)    Remote history - WARCEF   Bladder cancer (HCC)    BPH (benign prostatic hyperplasia)    Cardiomyopathy (HCC)    CHF (congestive heart failure) (HCC)    a. EF 30-35% by echo in 2018 and 10/2018, at 25-30% in 05/2020   Coronary atherosclerosis of native coronary artery    Multivessel s/p CABG in 2002 post-IMI, LVEF 35% up to 50% postoperatively;   Depression    Difficulty sleeping    Erectile dysfunction    Essential hypertension    Frequency of urination    History of bipolar disorder    Hyperlipidemia    IBS (irritable bowel syndrome)    Myocardial infarction (HCC) 2002    Past Surgical History:  Procedure Laterality Date   BIOPSY  03/28/2018   Procedure: BIOPSY;  Surgeon: Malissa Hippo, MD;  [facility];  Service: Endoscopy;;  esophagus   BIOPSY  03/09/2021   Procedure: BIOPSY;  Surgeon: Dolores Frame, MD;  [facility];  Service: Gastroenterology;;   BIV PACEMAKER INSERTION CRT-P N/A 05/12/2020   Procedure: BIV PACEMAKER INSERTION CRT-P;  Surgeon: Hillis Range, MD;  [facility]: [facility];  Service: Cardiovascular;  Laterality: N/A;   CATARACT EXTRACTION W/PHACO Left 12/29/2014   Procedure: CATARACT EXTRACTION PHACO AND INTRAOCULAR LENS PLACEMENT (IOC);  Surgeon: Susa Simmonds, MD;  Location: [facility];  Service: Ophthalmology;  Laterality: Left;  CDE:3.71   CATARACT EXTRACTION W/PHACO Right 03/30/2015   Procedure: CATARACT EXTRACTION PHACO AND INTRAOCULAR LENS PLACEMENT RIGHT EYE CDE=2.56;  Surgeon: Susa Simmonds, MD;  Location: [facility];  Service: Ophthalmology;  Laterality: Right;   CIRCUMCISION  10/11/2003   COLONOSCOPY N/A 07/11/2012   rehman,transverse colon polyp (benign lymphoid); tortuous colon; prep adequate with much suction/lavage; hemorrhoids.   CORONARY ARTERY BYPASS GRAFT  08/10/2000   Dr. Tyrone Sage - LIMA to LAD, SVG to diagonal, SVG to PDA TRIPLE BYPASS   CYSTOSCOPY W/ RETROGRADES Bilateral 03/02/2015   Procedure: CYSTOSCOPY WITH RIGHT RETROGRADE PYELOGRAM,  ATTEMPTED LEFT RETROGRADE PYELOGRAM;  Surgeon: Malen Gauze, MD;  Location: [facility];  Service: Urology;  Laterality: Bilateral;   ESOPHAGEAL DILATION N/A 03/28/2018   Procedure: ESOPHAGEAL DILATION;  Surgeon: Malissa Hippo, MD;  [facility]: [facility];  Service: Endoscopy;  Laterality: N/A;   ESOPHAGEAL DILATION N/A 03/15/2022   Procedure: ESOPHAGEAL DILATION;  Surgeon: Dolores Frame, MD;  [facility];  Service: Gastroenterology;  Laterality: N/A;   ESOPHAGOGASTRODUODENOSCOPY N/A 03/28/2018   rehman,Abnormal esophageal motility. mild schatzki ring at GEJ, dilated, patch of salmon colored mucosa at distal esophagus. Barrett's esophagus. 2cm HH. erosive gastropathy, normal pylorus, duodenal erosions w/o bleeding. normal second portion of duodenum   ESOPHAGOGASTRODUODENOSCOPY (EGD) WITH PROPOFOL N/A 03/09/2021   Procedure: ESOPHAGOGASTRODUODENOSCOPY (EGD) WITH PROPOFOL;  Surgeon: Dolores Frame, MD;  [facility];  Service: Gastroenterology;  Laterality: N/A;  945   ESOPHAGOGASTRODUODENOSCOPY (EGD) WITH PROPOFOL N/A 03/15/2022   Procedure: ESOPHAGOGASTRODUODENOSCOPY (  EGD) WITH PROPOFOL;  Surgeon: Marguerita Merles, Reuel Boom, MD;  [facility]: [facility];  Service: Gastroenterology;  Laterality: N/A;  1045am, asa 3   RIGHT/LEFT HEART CATH AND CORONARY/GRAFT ANGIOGRAPHY N/A 08/26/2022   Procedure: RIGHT/LEFT HEART CATH AND CORONARY/GRAFT ANGIOGRAPHY;  Surgeon: Swaziland, Peter M, MD;  Location:  [facility] INVASIVE CV LAB;  Service: Cardiovascular;  Laterality: N/A;   TEMPORARY PACEMAKER N/A 05/12/2020   Procedure: TEMPORARY PACEMAKER;  Surgeon: Swaziland, Peter M, MD;  Location: [facility] INVASIVE CV LAB;  Service: Cardiovascular;  Laterality: N/A;   TONSILLECTOMY     TRANSURETHRAL RESECTION OF BLADDER TUMOR N/A 01/26/2015   Procedure: TRANSURETHRAL RESECTION OF BLADDER TUMOR (TURBT);  Surgeon: Malen Gauze, MD;  Location: [facility];  Service: Urology;  Laterality: N/A;   TRANSURETHRAL RESECTION OF BLADDER TUMOR WITH GYRUS (TURBT-GYRUS) N/A 03/02/2015   Procedure: TRANSURETHRAL RESECTION OF BLADDER TUMOR WITH GYRUS (TURBT-GYRUS);  Surgeon: Malen Gauze, MD;  Location: [facility];  Service: Urology;  Laterality: N/A;   TRANSURETHRAL RESECTION OF PROSTATE N/A 01/26/2015   Procedure: TRANSURETHRAL RESECTION OF THE PROSTATE WITH GYRUS INSTRUMENTS;  Surgeon: Malen Gauze, MD;  Location: [facility];  Service: Urology;  Laterality: N/A;    Social History   Socioeconomic History   Marital status: Widowed    Spouse name: Not on file   Number of children: Not on file   Years of education: Not on file   Highest education level: Not on file  Occupational History   Not on file  Tobacco Use   Smoking status: Former    Current packs/day: 0.00    Average packs/day: 1 pack/day for 15.0 years (15.0 ttl pk-yrs)    Types: Cigarettes    Start date: 01/11/1956    Quit date: 01/11/1971    Years since quitting: 51.8    Passive exposure: Past   Smokeless tobacco: Never  Vaping Use   Vaping status: Never Used  Substance and Sexual Activity   Alcohol use: Not Currently    Comment: occ.   Drug use: No   Sexual activity: Not Currently  Other Topics Concern   Not on file  Social History Narrative   Married with no children    Exercises 4 times weekly   Caffeine once a day   Social Determinants of Health   Financial Resource Strain: Not on file  Food Insecurity: No Food Insecurity (08/24/2022)   Hunger  Vital Sign    Worried About Running Out of Food in the Last Year: Never true    Ran Out of Food in the Last Year: Never true  Transportation Needs: No Transportation Needs (08/24/2022)   PRAPARE - Administrator, Civil Service (Medical): No    Lack of Transportation (Non-Medical): No  Physical Activity: Not on file  Stress: Not on file  Social Connections: Not on file  Intimate Partner Violence: Not At Risk (08/25/2022)   Humiliation, Afraid, Rape, and Kick questionnaire    Fear of Current or Ex-Partner: No    Emotionally Abused: No    Physically Abused: No    Sexually Abused: No   Family History  Problem Relation Age of Onset   Asthma Mother    Heart attack Father 21   Diabetes Brother       VITAL SIGNS BP 117/76   Pulse 75   Temp (!) 97.2 F (36.2 C)   Resp 20   Ht 6' (1.829 m)   Wt 200 lb 3.2 oz (90.8 kg)   SpO2  100%   BMI 27.15 kg/m   Outpatient Encounter Medications as of 11/11/2022  Medication Sig   divalproex (DEPAKOTE) 250 MG DR tablet Take 250 mg by mouth 2 (two) times daily.   famotidine (PEPCID) 40 MG tablet Take 40 mg by mouth daily.   furosemide (LASIX) 40 MG tablet Take 40 mg by mouth daily.   loperamide (IMODIUM) 2 MG capsule Take 1 capsule (2 mg total) by mouth as needed for diarrhea or loose stools.   LORazepam (ATIVAN) 2 MG/ML concentrated solution Place 0.3 mLs (0.6 mg total) under the tongue every 6 (six) hours as needed for anxiety. The dose is 0.5 mg   melatonin 3 MG TABS tablet Take 1 tablet (3 mg total) by mouth at bedtime.   Morphine Sulfate (MORPHINE CONCENTRATE) 10 MG/0.5ML SOLN concentrated solution Place 0.25 mLs (5 mg total) under the tongue every 2 (two) hours as needed for moderate pain (or dyspnea).   ondansetron (ZOFRAN-ODT) 8 MG disintegrating tablet Take 8 mg by mouth every 4 (four) hours as needed for nausea or vomiting.   temazepam (RESTORIL) 15 MG capsule Take 1 capsule (15 mg total) by mouth at bedtime as needed for  sleep.   traMADol (ULTRAM) 50 MG tablet Take 0.5 tablets (25 mg total) by mouth 2 (two) times daily.   No facility-administered encounter medications on file as of 11/11/2022.     SIGNIFICANT DIAGNOSTIC EXAMS       ASSESSMENT/ PLAN:     Synthia Innocent NP [facility]  Contact [phone] Monday through Friday 8am- 5pm  After hours call [phone]

## 2022-11-14 NOTE — Progress Notes (Signed)
This encounter was created in error - please disregard.

## 2022-11-25 ENCOUNTER — Encounter: Payer: Self-pay | Admitting: Internal Medicine

## 2022-11-25 ENCOUNTER — Non-Acute Institutional Stay (SKILLED_NURSING_FACILITY): Admitting: Internal Medicine

## 2022-11-25 DIAGNOSIS — K219 Gastro-esophageal reflux disease without esophagitis: Secondary | ICD-10-CM

## 2022-11-25 DIAGNOSIS — I10 Essential (primary) hypertension: Secondary | ICD-10-CM

## 2022-11-25 DIAGNOSIS — D696 Thrombocytopenia, unspecified: Secondary | ICD-10-CM | POA: Diagnosis not present

## 2022-11-25 DIAGNOSIS — I700 Atherosclerosis of aorta: Secondary | ICD-10-CM | POA: Diagnosis not present

## 2022-11-25 DIAGNOSIS — F5104 Psychophysiologic insomnia: Secondary | ICD-10-CM | POA: Diagnosis not present

## 2022-11-25 NOTE — Assessment & Plan Note (Signed)
At present he denies any dysphagia or dyspepsia.  This is despite history of esophageal dilation x 2 previously. He does describe slurring speech for last several months which possibly could be related to silent reflux.  Consider speech therapy evaluation.

## 2022-11-25 NOTE — Patient Instructions (Signed)
See assessment and plan under each diagnosis in the problem list and acutely for this visit 

## 2022-11-25 NOTE — Assessment & Plan Note (Signed)
BP controlled without antihypertensive medications

## 2022-11-25 NOTE — Progress Notes (Unsigned)
NURSING HOME LOCATION:  [facility] ROOM NUMBER:  125  CODE STATUS:  DNR  PCP:  Synthia Innocent NP  This is a nursing facility follow up visit of chronic medical diagnoses & to document compliance with Regulation 483.30 (c) in The Long Term Care Survey Manual Phase 2 which mandates caregiver visit ( visits can alternate among physician, PA or NP as per statutes) within 10 days of 30 days / 60 days/ 90 days post admission to SNF date    Interim medical record and care since last SNF visit was updated with review of diagnostic studies and change in clinical status since last visit were documented.  HPI: He is a permanent resident of this facility with medical diagnoses of history of asthma, atrial fibrillation, history of bladder cancer, BPH, history of congestive heart failure, CAD with past history of MI, IBS, dyslipidemia, essential hypertension, and history of bipolar disorder.  He is had esophageal dilation on 2 occasions and is also had triple CABG.  Most recent labs were 11/07/2022 and revealed stable hyponatremia with a sodium of 132.  He also has exhibited slight prerenal azotemia with a BUN of 25.  There is been some improvement in his CKD with current creatinine of 1.17 and GFR greater than 60 with prior values of 1.31 and 54.  On 10/21 BNP was dramatically elevated at 1686.  Serially he has normochromic, normocytic anemia has been stable with current H/H of 11.4/33.7.  He has chronic thrombocytopenia with a platelet count ranging from a low of 85,000 up to 108,000.   Review of systems: His major complaint is that he has difficulty sleeping despite Melatonin 3 mg qd.  According to the record this is a chronic issue.  He describes slight cough with scant amounts of sputum production and intermittent shortness of breath.  Despite the dramatic elevation of the BNP previously he denies paroxysmal nocturnal dyspnea.  The peripheral edema is stable.  Despite his history of  esophageal dilation on 2 occasions; he denies any active GI symptoms. In reference to chronic thrombocytopenia and anemia he has no bleeding dyscrasias. He states that his speech has been somewhat slurred for several months.  He denies any symptoms to suggest any recent neurologic event.  Constitutional: No fever, significant weight change, fatigue  Eyes: No redness, discharge, pain, vision change ENT/mouth: No nasal congestion,  purulent discharge, earache, change in hearing, sore throat  Cardiovascular: No chest pain, palpitations, paroxysmal nocturnal dyspnea, claudication, edema  Respiratory: No cough, sputum production, hemoptysis, DOE, significant snoring, apnea   Gastrointestinal: No heartburn, dysphagia, abdominal pain, nausea /vomiting, rectal bleeding, melena, change in bowels Genitourinary: No dysuria, hematuria, pyuria, incontinence, nocturia Musculoskeletal: No joint stiffness, joint swelling, weakness, pain Dermatologic: No rash, pruritus, change in appearance of skin Neurologic: No dizziness, headache, syncope, seizures, numbness, tingling Psychiatric: No significant anxiety, depression, insomnia, anorexia Endocrine: No change in hair/skin/nails, excessive thirst, excessive hunger, excessive urination  Hematologic/lymphatic: No significant bruising, lymphadenopathy, abnormal bleeding Allergy/immunology: No itchy/watery eyes, significant sneezing, urticaria, angioedema  Physical exam:  Pertinent or positive findings: There is thinning of his hair over the crown.  He is slightly hard of hearing.  Indeed his speech is slightly slurred and garbled.  Heart sounds are distant.  Breath sounds are decreased especially at the bases.  Abdomen slightly protuberant.  Pedal pulses are not palpable.  He has 1/2+ pitting edema.  He has hyperpigmented sclerotic irregular skin changes over the lower extremities.  General appearance: Adequately nourished;  no acute distress, increased work of  breathing is present.   Lymphatic: No lymphadenopathy about the head, neck, axilla. Eyes: No conjunctival inflammation or lid edema is present. There is no scleral icterus. Ears:  External ear exam shows no significant lesions or deformities.   Nose:  External nasal examination shows no deformity or inflammation. Nasal mucosa are pink and moist without lesions, exudates Oral exam:  Lips and gums are healthy appearing. There is no oropharyngeal erythema or exudate. Neck:  No thyromegaly, masses, tenderness noted.    Heart:  Normal rate and regular rhythm. S1 and S2 normal without gallop, murmur, click, rub .  Lungs: Chest clear to auscultation without wheezes, rhonchi, rales, rubs. Abdomen: Bowel sounds are normal. Abdomen is soft and nontender with no organomegaly, hernias, masses. GU: Deferred  Extremities:  No cyanosis, clubbing, edema  Neurologic exam : Cn 2-7 intact Strength equal  in upper & lower extremities Balance, Rhomberg, finger to nose testing could not be completed due to clinical state Deep tendon reflexes are equal Skin: Warm & dry w/o tenting. No significant lesions or rash.  See summary under each active problem in the Problem List with associated updated therapeutic plan

## 2022-11-25 NOTE — Assessment & Plan Note (Signed)
Platelet count ranges from the low of 85,000 up to a high of 108,000.  Despite this no bleeding dyscrasias reported and normochromic/normocytic anemia has been stable.

## 2022-11-25 NOTE — Assessment & Plan Note (Signed)
He denies any anginal equivalent at this time.

## 2022-12-01 ENCOUNTER — Other Ambulatory Visit: Payer: Self-pay | Admitting: Adult Health

## 2022-12-01 ENCOUNTER — Ambulatory Visit (INDEPENDENT_AMBULATORY_CARE_PROVIDER_SITE_OTHER): Payer: Medicare HMO | Admitting: Gastroenterology

## 2022-12-01 MED ORDER — TRAMADOL HCL 50 MG PO TABS: 25.0000 mg | ORAL_TABLET | Freq: Two times a day (BID) | ORAL | 0 refills | Status: DC

## 2022-12-01 MED ORDER — TEMAZEPAM 15 MG PO CAPS: 15.0000 mg | ORAL_CAPSULE | Freq: Every evening | ORAL | 0 refills | Status: DC | PRN

## 2022-12-02 ENCOUNTER — Non-Acute Institutional Stay (SKILLED_NURSING_FACILITY): Payer: Self-pay | Admitting: Adult Health

## 2022-12-02 ENCOUNTER — Encounter: Payer: Self-pay | Admitting: Adult Health

## 2022-12-02 DIAGNOSIS — D696 Thrombocytopenia, unspecified: Secondary | ICD-10-CM | POA: Diagnosis not present

## 2022-12-02 DIAGNOSIS — F319 Bipolar disorder, unspecified: Secondary | ICD-10-CM

## 2022-12-02 DIAGNOSIS — I5082 Biventricular heart failure: Secondary | ICD-10-CM | POA: Diagnosis not present

## 2022-12-02 NOTE — Progress Notes (Signed)
Location:  [facility] Room Number: 125P Place of Service:  SNF (31)   CODE STATUS: DNR  Allergies  Allergen Reactions   Spironolactone Other (See Comments)    Hyperkalemia    Chief Complaint  Patient presents with   Acute Visit    Care Planning Meeting    HPI:  We have come together for his care plan meeting. Family present. BIMS 15/15 mood 2/30: nervous at times; some depression. Nonambulatory without falls. He requires moderate to dependent assist with his adl care. He is frequently incontinent of bladder and bowel. Dietary: feeds self; regular diet; appetite 51-75% weight is 200 pounds. Therapy: none at this time. He continues to be followed for his chronic illnesses including:   Biventricular congestive heart failure   Thrombocytopenia  Bipolar affective disorder remission status unspecified. He continues to be followed by hospice care. He does have some shortness of breath activity and talking.   Past Medical History:  Diagnosis Date   Allergic rhinitis    Arthritis    Asthma    Childhood   Atrial fibrillation (HCC)    Remote history - WARCEF   Bladder cancer (HCC)    BPH (benign prostatic hyperplasia)    Cardiomyopathy (HCC)    CHF (congestive heart failure) (HCC)    a. EF 30-35% by echo in 2018 and 10/2018, at 25-30% in 05/2020   Coronary atherosclerosis of native coronary artery    Multivessel s/p CABG in 2002 post-IMI, LVEF 35% up to 50% postoperatively;   Depression    Difficulty sleeping    Erectile dysfunction    Essential hypertension    Frequency of urination    History of bipolar disorder    Hyperlipidemia    IBS (irritable bowel syndrome)    Myocardial infarction (HCC) 2002    Past Surgical History:  Procedure Laterality Date   BIOPSY  03/28/2018   Procedure: BIOPSY;  Surgeon: Malissa Hippo, MD;  [facility];  Service: Endoscopy;;  esophagus   BIOPSY  03/09/2021   Procedure: BIOPSY;  Surgeon: Dolores Frame, MD;  [facility];  Service: Gastroenterology;;   BIV PACEMAKER INSERTION CRT-P N/A 05/12/2020   Procedure: BIV PACEMAKER INSERTION CRT-P;  Surgeon: Hillis Range, MD;  Location: [facility] INVASIVE CV LAB;  Service: Cardiovascular;  Laterality: N/A;   CATARACT EXTRACTION W/PHACO Left 12/29/2014   Procedure: CATARACT EXTRACTION PHACO AND INTRAOCULAR LENS PLACEMENT (IOC);  Surgeon: Susa Simmonds, MD;  Location: [facility];  Service: Ophthalmology;  Laterality: Left;  CDE:3.71   CATARACT EXTRACTION W/PHACO Right 03/30/2015   Procedure: CATARACT EXTRACTION PHACO AND INTRAOCULAR LENS PLACEMENT RIGHT EYE CDE=2.56;  Surgeon: Susa Simmonds, MD;  Location: [facility];  Service: Ophthalmology;  Laterality: Right;   CIRCUMCISION  10/11/2003   COLONOSCOPY N/A 07/11/2012   rehman,transverse colon polyp (benign lymphoid); tortuous colon; prep adequate with much suction/lavage; hemorrhoids.   CORONARY ARTERY BYPASS GRAFT  08/10/2000   Dr. Tyrone Sage - LIMA to LAD, SVG to diagonal, SVG to PDA TRIPLE BYPASS   CYSTOSCOPY W/ RETROGRADES Bilateral 03/02/2015   Procedure: CYSTOSCOPY WITH RIGHT RETROGRADE PYELOGRAM,  ATTEMPTED LEFT RETROGRADE PYELOGRAM;  Surgeon: Malen Gauze, MD;  Location: [facility];  Service: Urology;  Laterality: Bilateral;   ESOPHAGEAL DILATION N/A 03/28/2018   Procedure: ESOPHAGEAL DILATION;  Surgeon: Malissa Hippo, MD;  [facility]: [facility];  Service: Endoscopy;  Laterality: N/A;   ESOPHAGEAL DILATION N/A 03/15/2022   Procedure: ESOPHAGEAL DILATION;  Surgeon: Levon Hedger  Alisia Ferrari, MD;  [facility]: [facility];  Service: Gastroenterology;  Laterality: N/A;   ESOPHAGOGASTRODUODENOSCOPY N/A 03/28/2018   rehman,Abnormal esophageal motility. mild schatzki ring at GEJ, dilated, patch of salmon colored mucosa at distal esophagus. Barrett's esophagus. 2cm HH. erosive gastropathy, normal pylorus, duodenal erosions w/o bleeding. normal second portion of duodenum    ESOPHAGOGASTRODUODENOSCOPY (EGD) WITH PROPOFOL N/A 03/09/2021   Procedure: ESOPHAGOGASTRODUODENOSCOPY (EGD) WITH PROPOFOL;  Surgeon: Dolores Frame, MD;  [facility];  Service: Gastroenterology;  Laterality: N/A;  945   ESOPHAGOGASTRODUODENOSCOPY (EGD) WITH PROPOFOL N/A 03/15/2022   Procedure: ESOPHAGOGASTRODUODENOSCOPY (EGD) WITH PROPOFOL;  Surgeon: Dolores Frame, MD;  [facility];  Service: Gastroenterology;  Laterality: N/A;  1045am, asa 3   RIGHT/LEFT HEART CATH AND CORONARY/GRAFT ANGIOGRAPHY N/A 08/26/2022   Procedure: RIGHT/LEFT HEART CATH AND CORONARY/GRAFT ANGIOGRAPHY;  Surgeon: Swaziland, Peter M, MD;  Location: [facility] INVASIVE CV LAB;  Service: Cardiovascular;  Laterality: N/A;   TEMPORARY PACEMAKER N/A 05/12/2020   Procedure: TEMPORARY PACEMAKER;  Surgeon: Swaziland, Peter M, MD;  Location: [facility] INVASIVE CV LAB;  Service: Cardiovascular;  Laterality: N/A;   TONSILLECTOMY     TRANSURETHRAL RESECTION OF BLADDER TUMOR N/A 01/26/2015   Procedure: TRANSURETHRAL RESECTION OF BLADDER TUMOR (TURBT);  Surgeon: Malen Gauze, MD;  Location: [facility];  Service: Urology;  Laterality: N/A;   TRANSURETHRAL RESECTION OF BLADDER TUMOR WITH GYRUS (TURBT-GYRUS) N/A 03/02/2015   Procedure: TRANSURETHRAL RESECTION OF BLADDER TUMOR WITH GYRUS (TURBT-GYRUS);  Surgeon: Malen Gauze, MD;  Location: [facility];  Service: Urology;  Laterality: N/A;   TRANSURETHRAL RESECTION OF PROSTATE N/A 01/26/2015   Procedure: TRANSURETHRAL RESECTION OF THE PROSTATE WITH GYRUS INSTRUMENTS;  Surgeon: Malen Gauze, MD;  Location: [facility];  Service: Urology;  Laterality: N/A;    Social History   Socioeconomic History   Marital status: Widowed    Spouse name: Not on file   Number of children: Not on file   Years of education: Not on file   Highest education level: Not on file  Occupational History   Not on file  Tobacco Use   Smoking status: Former    Current packs/day: 0.00     Average packs/day: 1 pack/day for 15.0 years (15.0 ttl pk-yrs)    Types: Cigarettes    Start date: 01/11/1956    Quit date: 01/11/1971    Years since quitting: 51.9    Passive exposure: Past   Smokeless tobacco: Never  Vaping Use   Vaping status: Never Used  Substance and Sexual Activity   Alcohol use: Not Currently    Comment: occ.   Drug use: No   Sexual activity: Not Currently  Other Topics Concern   Not on file  Social History Narrative   Married with no children    Exercises 4 times weekly   Caffeine once a day   Social Determinants of Health   Financial Resource Strain: Not on file  Food Insecurity: No Food Insecurity (08/24/2022)   Hunger Vital Sign    Worried About Running Out of Food in the Last Year: Never true    Ran Out of Food in the Last Year: Never true  Transportation Needs: No Transportation Needs (08/24/2022)   PRAPARE - Administrator, Civil Service (Medical): No    Lack of Transportation (Non-Medical): No  Physical Activity: Not on file  Stress: Not on file  Social Connections: Not on file  Intimate Partner Violence: Not At Risk (08/25/2022)   Humiliation,  Afraid, Rape, and Kick questionnaire    Fear of Current or Ex-Partner: No    Emotionally Abused: No    Physically Abused: No    Sexually Abused: No   Family History  Problem Relation Age of Onset   Asthma Mother    Heart attack Father 50   Diabetes Brother       VITAL SIGNS BP 128/62   Pulse 76   Temp (!) 97.5 F (36.4 C)   Resp 20   Ht 6' (1.829 m)   Wt 200 lb 3.2 oz (90.8 kg)   SpO2 98%   BMI 27.15 kg/m   Outpatient Encounter Medications as of 12/02/2022  Medication Sig   divalproex (DEPAKOTE) 250 MG DR tablet Take 250 mg by mouth 2 (two) times daily.   famotidine (PEPCID) 40 MG tablet Take 40 mg by mouth daily.   furosemide (LASIX) 40 MG tablet Take 40 mg by mouth daily.   loperamide (IMODIUM) 2 MG capsule Take 1 capsule (2 mg total) by mouth as needed for diarrhea or  loose stools.   LORazepam (ATIVAN) 2 MG/ML concentrated solution Place 0.3 mLs (0.6 mg total) under the tongue every 6 (six) hours as needed for anxiety. The dose is 0.5 mg   melatonin 3 MG TABS tablet Take 1 tablet (3 mg total) by mouth at bedtime.   Morphine Sulfate (MORPHINE CONCENTRATE) 10 MG/0.5ML SOLN concentrated solution Place 0.25 mLs (5 mg total) under the tongue every 2 (two) hours as needed for moderate pain (or dyspnea).   ondansetron (ZOFRAN-ODT) 8 MG disintegrating tablet Take 8 mg by mouth every 4 (four) hours as needed for nausea or vomiting.   polyethylene glycol (MIRALAX / GLYCOLAX) 17 g packet Take 17 g by mouth daily.   temazepam (RESTORIL) 15 MG capsule Take 1 capsule (15 mg total) by mouth at bedtime as needed for sleep.   traMADol (ULTRAM) 50 MG tablet Take 0.5 tablets (25 mg total) by mouth 2 (two) times daily.   No facility-administered encounter medications on file as of 12/02/2022.     SIGNIFICANT DIAGNOSTIC EXAMS  08-20-22: wbc 11.8; hgb 12.3; hct 35.5; mcv 96.2 plt 114; glucose 105; bun 56; creat 3.78; k+ 5.2; na++ 134; ca 8.6; gfr 15; protein 7.2; albumin 4.2 POC +; blood culture: no growth 08-21-22: BNP 2520.5 08-22-22: wbc 13.1; hgb 11.9; hct 35.5; mcv 93.7 plt 96; glucose 105; bun 67; creat 2.93; k+ 4.1; na++ 128; ca 8.4; gfr 21; hgb A1c 6.0; tsh 1.499; chol 132; ldl 73; trig 45; hdl 50; mag 2.0 08-29-22: wbc 9.3; hgb 10.7; hct 31.5; mcv 89.0 plt 91; glucose 351;bun 66; creat 2.30; k+ 3.6; na++ 117; ca 7.8; gfr 28; protein 5.6 albumin 2.9; vitamin B12: 741; phos 3.4 iron 64; tibc 256; ferritin 48 08-30-22: wbc 12.7; hgb 11.4; hct 33.7; mcv 89.4 plt 85 09-26-22: glucose 91; bun 26; creat 1.41; k+ 4.1; na++ 133; ca 8.6; gfr 50 10-31-22: glucose 73; bun 23; creat 1.31; k+ 3.8; na++ 131; ca 8.4; gfr 54; BNP 1686.0  NO NEW LABS   Review of Systems  Constitutional:  Negative for malaise/fatigue.  Respiratory:  Positive for shortness of breath. Negative for cough.    Cardiovascular:  Positive for leg swelling. Negative for chest pain and palpitations.  Gastrointestinal:  Negative for abdominal pain, constipation and heartburn.  Musculoskeletal:  Negative for back pain, joint pain and myalgias.  Skin: Negative.   Neurological:  Negative for dizziness.  Psychiatric/Behavioral:  The patient is not nervous/anxious.  Physical Exam Constitutional:      General: He is not in acute distress.    Appearance: He is well-developed. He is not diaphoretic.  Neck:     Thyroid: No thyromegaly.  Cardiovascular:     Rate and Rhythm: Normal rate and regular rhythm.     Pulses: Normal pulses.     Heart sounds: Murmur heard.     Comments: Pacemaker  Pulmonary:     Effort: Pulmonary effort is normal. No respiratory distress.     Breath sounds: Normal breath sounds.  Abdominal:     General: Bowel sounds are normal. There is no distension.     Palpations: Abdomen is soft.     Tenderness: There is no abdominal tenderness.  Musculoskeletal:        General: Normal range of motion.     Cervical back: Neck supple.     Right lower leg: Edema present.     Left lower leg: Edema present.  Lymphadenopathy:     Cervical: No cervical adenopathy.  Skin:    General: Skin is warm and dry.  Neurological:     Mental Status: He is alert. Mental status is at baseline.  Psychiatric:        Mood and Affect: Mood normal.      ASSESSMENT/ PLAN:  TODAY  Biventricular congestive heart failure Thrombocytopenia Bipolar affective disorder remission status unspecified  Will continue current medications Will continue current plan of care Will continue to monitor his status.   Time spent patient: 40 minutes: medications; plan of care; adl needs.    Synthia Innocent NP [facility]   call [phone]

## 2022-12-05 ENCOUNTER — Ambulatory Visit (INDEPENDENT_AMBULATORY_CARE_PROVIDER_SITE_OTHER): Payer: Medicare HMO | Admitting: Gastroenterology

## 2022-12-13 ENCOUNTER — Telehealth: Payer: Self-pay

## 2022-12-13 NOTE — Telephone Encounter (Signed)
The pt niece called stating he is now in hospice care. I canceled all upcoming remote appointments.

## 2022-12-15 ENCOUNTER — Encounter: Payer: Self-pay | Admitting: General Practice

## 2022-12-28 ENCOUNTER — Encounter: Payer: Medicare HMO | Admitting: Internal Medicine

## 2023-01-05 ENCOUNTER — Non-Acute Institutional Stay (SKILLED_NURSING_FACILITY): Payer: Medicare HMO | Admitting: Adult Health

## 2023-01-05 ENCOUNTER — Encounter: Payer: Self-pay | Admitting: Adult Health

## 2023-01-05 DIAGNOSIS — I429 Cardiomyopathy, unspecified: Secondary | ICD-10-CM

## 2023-01-05 DIAGNOSIS — I5022 Chronic systolic (congestive) heart failure: Secondary | ICD-10-CM | POA: Diagnosis not present

## 2023-01-05 DIAGNOSIS — I5082 Biventricular heart failure: Secondary | ICD-10-CM | POA: Diagnosis not present

## 2023-01-05 NOTE — Progress Notes (Unsigned)
Location:  [facility] Room Number: 125 Place of Service:  SNF (31)   CODE STATUS: ***  Allergies  Allergen Reactions   Spironolactone Other (See Comments)    Hyperkalemia    Chief Complaint  Patient presents with   Medical Management of Chronic Issues    HPI:    Past Medical History:  Diagnosis Date   Allergic rhinitis    Arthritis    Asthma    Childhood   Atrial fibrillation (HCC)    Remote history - WARCEF   Bladder cancer (HCC)    BPH (benign prostatic hyperplasia)    Cardiomyopathy (HCC)    CHF (congestive heart failure) (HCC)    a. EF 30-35% by echo in 2018 and 10/2018, at 25-30% in 05/2020   Coronary atherosclerosis of native coronary artery    Multivessel s/p CABG in 2002 post-IMI, LVEF 35% up to 50% postoperatively;   Depression    Difficulty sleeping    Erectile dysfunction    Essential hypertension    Frequency of urination    History of bipolar disorder    Hyperlipidemia    IBS (irritable bowel syndrome)    Myocardial infarction (HCC) 2002    Past Surgical History:  Procedure Laterality Date   BIOPSY  03/28/2018   Procedure: BIOPSY;  Surgeon: Malissa Hippo, MD;  [facility];  Service: Endoscopy;;  esophagus   BIOPSY  03/09/2021   Procedure: BIOPSY;  Surgeon: Dolores Frame, MD;  [facility];  Service: Gastroenterology;;   BIV PACEMAKER INSERTION CRT-P N/A 05/12/2020   Procedure: BIV PACEMAKER INSERTION CRT-P;  Surgeon: Hillis Range, MD;  [facility]: [facility];  Service: Cardiovascular;  Laterality: N/A;   CATARACT EXTRACTION W/PHACO Left 12/29/2014   Procedure: CATARACT EXTRACTION PHACO AND INTRAOCULAR LENS PLACEMENT (IOC);  Surgeon: Susa Simmonds, MD;  Location: [facility];  Service: Ophthalmology;  Laterality: Left;  CDE:3.71   CATARACT EXTRACTION W/PHACO Right 03/30/2015   Procedure: CATARACT EXTRACTION PHACO AND INTRAOCULAR LENS PLACEMENT RIGHT EYE CDE=2.56;  Surgeon:  Susa Simmonds, MD;  Location: [facility];  Service: Ophthalmology;  Laterality: Right;   CIRCUMCISION  10/11/2003   COLONOSCOPY N/A 07/11/2012   rehman,transverse colon polyp (benign lymphoid); tortuous colon; prep adequate with much suction/lavage; hemorrhoids.   CORONARY ARTERY BYPASS GRAFT  08/10/2000   Dr. Tyrone Sage - LIMA to LAD, SVG to diagonal, SVG to PDA TRIPLE BYPASS   CYSTOSCOPY W/ RETROGRADES Bilateral 03/02/2015   Procedure: CYSTOSCOPY WITH RIGHT RETROGRADE PYELOGRAM,  ATTEMPTED LEFT RETROGRADE PYELOGRAM;  Surgeon: Malen Gauze, MD;  Location: [facility];  Service: Urology;  Laterality: Bilateral;   ESOPHAGEAL DILATION N/A 03/28/2018   Procedure: ESOPHAGEAL DILATION;  Surgeon: Malissa Hippo, MD;  [facility];  Service: Endoscopy;  Laterality: N/A;   ESOPHAGEAL DILATION N/A 03/15/2022   Procedure: ESOPHAGEAL DILATION;  Surgeon: Dolores Frame, MD;  [facility];  Service: Gastroenterology;  Laterality: N/A;   ESOPHAGOGASTRODUODENOSCOPY N/A 03/28/2018   rehman,Abnormal esophageal motility. mild schatzki ring at GEJ, dilated, patch of salmon colored mucosa at distal esophagus. Barrett's esophagus. 2cm HH. erosive gastropathy, normal pylorus, duodenal erosions w/o bleeding. normal second portion of duodenum   ESOPHAGOGASTRODUODENOSCOPY (EGD) WITH PROPOFOL N/A 03/09/2021   Procedure: ESOPHAGOGASTRODUODENOSCOPY (EGD) WITH PROPOFOL;  Surgeon: Dolores Frame, MD;  [facility];  Service: Gastroenterology;  Laterality: N/A;  945   ESOPHAGOGASTRODUODENOSCOPY (EGD) WITH PROPOFOL N/A 03/15/2022   Procedure: ESOPHAGOGASTRODUODENOSCOPY (EGD) WITH  PROPOFOL;  Surgeon: Marguerita Merles, Reuel Boom, MD;  [facility];  Service: Gastroenterology;  Laterality: N/A;  1045am, asa 3   RIGHT/LEFT HEART CATH AND CORONARY/GRAFT ANGIOGRAPHY N/A 08/26/2022   Procedure: RIGHT/LEFT HEART CATH AND CORONARY/GRAFT ANGIOGRAPHY;  Surgeon: Swaziland, Peter M, MD;   Location: [facility] INVASIVE CV LAB;  Service: Cardiovascular;  Laterality: N/A;   TEMPORARY PACEMAKER N/A 05/12/2020   Procedure: TEMPORARY PACEMAKER;  Surgeon: Swaziland, Peter M, MD;  Location: [facility] INVASIVE CV LAB;  Service: Cardiovascular;  Laterality: N/A;   TONSILLECTOMY     TRANSURETHRAL RESECTION OF BLADDER TUMOR N/A 01/26/2015   Procedure: TRANSURETHRAL RESECTION OF BLADDER TUMOR (TURBT);  Surgeon: Malen Gauze, MD;  Location: [facility];  Service: Urology;  Laterality: N/A;   TRANSURETHRAL RESECTION OF BLADDER TUMOR WITH GYRUS (TURBT-GYRUS) N/A 03/02/2015   Procedure: TRANSURETHRAL RESECTION OF BLADDER TUMOR WITH GYRUS (TURBT-GYRUS);  Surgeon: Malen Gauze, MD;  Location: [facility];  Service: Urology;  Laterality: N/A;   TRANSURETHRAL RESECTION OF PROSTATE N/A 01/26/2015   Procedure: TRANSURETHRAL RESECTION OF THE PROSTATE WITH GYRUS INSTRUMENTS;  Surgeon: Malen Gauze, MD;  Location: [facility];  Service: Urology;  Laterality: N/A;    Social History   Socioeconomic History   Marital status: Widowed    Spouse name: Not on file   Number of children: Not on file   Years of education: Not on file   Highest education level: Not on file  Occupational History   Not on file  Tobacco Use   Smoking status: Former    Current packs/day: 0.00    Average packs/day: 1 pack/day for 15.0 years (15.0 ttl pk-yrs)    Types: Cigarettes    Start date: 01/11/1956    Quit date: 01/11/1971    Years since quitting: 52.0    Passive exposure: Past   Smokeless tobacco: Never  Vaping Use   Vaping status: Never Used  Substance and Sexual Activity   Alcohol use: Not Currently    Comment: occ.   Drug use: No   Sexual activity: Not Currently  Other Topics Concern   Not on file  Social History Narrative   Married with no children    Exercises 4 times weekly   Caffeine once a day   Social Drivers of Health   Financial Resource Strain: Not on file  Food Insecurity: No Food Insecurity (08/24/2022)    Hunger Vital Sign    Worried About Running Out of Food in the Last Year: Never true    Ran Out of Food in the Last Year: Never true  Transportation Needs: No Transportation Needs (08/24/2022)   PRAPARE - Administrator, Civil Service (Medical): No    Lack of Transportation (Non-Medical): No  Physical Activity: Not on file  Stress: Not on file  Social Connections: Not on file  Intimate Partner Violence: Not At Risk (08/25/2022)   Humiliation, Afraid, Rape, and Kick questionnaire    Fear of Current or Ex-Partner: No    Emotionally Abused: No    Physically Abused: No    Sexually Abused: No   Family History  Problem Relation Age of Onset   Asthma Mother    Heart attack Father 78   Diabetes Brother       VITAL SIGNS BP 97/74   Pulse 66   Temp (!) 97.4 F (36.3 C)   Resp 20   Ht 6\' 2"  (1.88 m)   Wt 184 lb 12.8 oz (83.8 kg)   SpO2 97%  BMI 23.73 kg/m   Outpatient Encounter Medications as of 01/05/2023  Medication Sig   divalproex (DEPAKOTE) 250 MG DR tablet Take 250 mg by mouth 2 (two) times daily.   famotidine (PEPCID) 40 MG tablet Take 40 mg by mouth daily.   furosemide (LASIX) 40 MG tablet Take 40 mg by mouth daily.   loperamide (IMODIUM) 2 MG capsule Take 1 capsule (2 mg total) by mouth as needed for diarrhea or loose stools.   LORazepam (ATIVAN) 2 MG/ML concentrated solution Place 0.3 mLs (0.6 mg total) under the tongue every 6 (six) hours as needed for anxiety. The dose is 0.5 mg   melatonin 3 MG TABS tablet Take 1 tablet (3 mg total) by mouth at bedtime.   Morphine Sulfate (MORPHINE CONCENTRATE) 10 MG/0.5ML SOLN concentrated solution Place 0.25 mLs (5 mg total) under the tongue every 2 (two) hours as needed for moderate pain (or dyspnea).   ondansetron (ZOFRAN-ODT) 8 MG disintegrating tablet Take 8 mg by mouth every 4 (four) hours as needed for nausea or vomiting.   polyethylene glycol (MIRALAX / GLYCOLAX) 17 g packet Take 17 g by mouth daily.   temazepam  (RESTORIL) 15 MG capsule Take 1 capsule (15 mg total) by mouth at bedtime as needed for sleep.   traMADol (ULTRAM) 50 MG tablet Take 0.5 tablets (25 mg total) by mouth 2 (two) times daily.   No facility-administered encounter medications on file as of 01/05/2023.     SIGNIFICANT DIAGNOSTIC EXAMS       ASSESSMENT/ PLAN:     Synthia Innocent NP [facility]  Contact [phone] Monday through Friday 8am- 5pm  After hours call [phone]

## 2023-01-06 ENCOUNTER — Encounter (HOSPITAL_COMMUNITY)
Admission: RE | Admit: 2023-01-06 | Discharge: 2023-01-06 | Disposition: A | Discharge status: Home / Self Care | Source: Skilled Nursing Facility | Attending: Adult Health | Admitting: Adult Health

## 2023-01-06 DIAGNOSIS — I130 Hypertensive heart and chronic kidney disease with heart failure and stage 1 through stage 4 chronic kidney disease, or unspecified chronic kidney disease: Secondary | ICD-10-CM | POA: Insufficient documentation

## 2023-01-06 LAB — BASIC METABOLIC PANEL
Anion gap: 11 (ref 5–15)
BUN: 39 mg/dL — ABNORMAL HIGH (ref 8–23)
CO2: 24 mmol/L (ref 22–32)
Calcium: 9.1 mg/dL (ref 8.9–10.3)
Chloride: 101 mmol/L (ref 98–111)
Creatinine, Ser: 1.65 mg/dL — ABNORMAL HIGH (ref 0.61–1.24)
GFR, Estimated: 41 mL/min — ABNORMAL LOW (ref >60)
Glucose, Bld: 101 mg/dL — ABNORMAL HIGH (ref 70–99)
Potassium: 4.2 mmol/L (ref 3.5–5.1)
Sodium: 136 mmol/L (ref 135–145)

## 2023-01-13 ENCOUNTER — Other Ambulatory Visit: Payer: Self-pay | Admitting: Adult Health

## 2023-01-13 MED ORDER — TRAMADOL HCL 50 MG PO TABS: 25.0000 mg | ORAL_TABLET | Freq: Two times a day (BID) | ORAL | 0 refills | Status: DC

## 2023-01-13 MED ORDER — TEMAZEPAM 15 MG PO CAPS: 15.0000 mg | ORAL_CAPSULE | Freq: Every evening | ORAL | 0 refills | Status: DC | PRN

## 2023-02-01 ENCOUNTER — Encounter: Payer: Self-pay | Admitting: Cardiology

## 2023-02-01 ENCOUNTER — Ambulatory Visit: Payer: Medicare HMO | Attending: Cardiology | Admitting: Cardiology

## 2023-02-01 VITALS — BP 120/76 | HR 93 | Ht 72.0 in | Wt 200.0 lb

## 2023-02-01 DIAGNOSIS — N1832 Chronic kidney disease, stage 3b: Secondary | ICD-10-CM

## 2023-02-01 DIAGNOSIS — I25119 Atherosclerotic heart disease of native coronary artery with unspecified angina pectoris: Secondary | ICD-10-CM

## 2023-02-01 DIAGNOSIS — I5020 Unspecified systolic (congestive) heart failure: Secondary | ICD-10-CM

## 2023-02-01 NOTE — Progress Notes (Signed)
Cardiology Office Note  Date: 02/01/2023   ID: [name], DOB 21-Feb-1940, MRN [id number]  History of Present Illness: Grant Stevens is an 83 y.o. male last seen in July 2024.  He is here today with his niece for follow-up visit.  I reviewed interval records.  He is currently residing at the [facility] with hospice care, although overall has stabilized since hospitalization in August 2024.  He is in a wheelchair today, but does use a walker to some degree.  Participating in activities such as bingo and movies.  He has NYHA class III dyspnea but no definite fluid retention.  No obvious angina.  He has had no syncope.  I reviewed his current medications.  Present regimen includes Lasix, otherwise taken off all cardiac medications.  I reviewed his lab work from December 2024 as noted below.  St. Jude pacemaker in place with follow-up by Dr. Ladona Ridgel.  No recent device check.  Physical Exam: VS:  BP 120/76   Pulse 93   Ht 6' (1.829 m)   Wt 200 lb (90.7 kg)   SpO2 98%   BMI 27.12 kg/m , BMI Body mass index is 27.12 kg/m.  Wt Readings from Last 3 Encounters:  02/01/23 200 lb (90.7 kg)  01/05/23 184 lb 12.8 oz (83.8 kg)  12/02/22 200 lb 3.2 oz (90.8 kg)    General: Patient appears comfortable at rest.  In wheelchair. HEENT: Conjunctiva and lids normal. Neck: Supple, no elevated JVP or carotid bruits. Lungs: Clear to auscultation, nonlabored breathing at rest. Cardiac: Indistinct PMI, RRR without obvious gallop, soft systolic murmur. Abdomen: Bowel sounds present. Extremities: Venous stasis and trace to 1+ ankle edema.  ECG:  An ECG dated 08/22/2022 was personally reviewed today and demonstrated:  Ventricular paced rhythm.  Labwork: 08/29/2022: ALT 19; AST 19; TSH 6.222 08/30/2022: Hemoglobin 11.4; Magnesium 2.1; Platelets 85 10/31/2022: B Natriuretic Peptide 1,686.0 01/06/2023: BUN 39; Creatinine, Ser 1.65; Potassium 4.2; Sodium 136     Component Value Date/Time   CHOL  132 08/22/2022 0502   TRIG 45 08/22/2022 0502   HDL 50 08/22/2022 0502   CHOLHDL 2.6 08/22/2022 0502   VLDL 9 08/22/2022 0502   LDLCALC 73 08/22/2022 0502   Other Studies Reviewed Today:  Echocardiogram 08/21/2022:  1. Left ventricular ejection fraction, by estimation, is <20%. The left  ventricle has severely decreased function. The left ventricle demonstrates  regional wall motion abnormalities (see scoring diagram/findings for  description). The left ventricular  internal cavity size was mildly to moderately dilated. Left ventricular  diastolic parameters are consistent with Grade III diastolic dysfunction  (restrictive).   2. Right ventricular systolic function is severely reduced. The right  ventricular size is mildly enlarged. There is mildly elevated pulmonary  artery systolic pressure. The estimated right ventricular systolic  pressure is 38.9 mmHg.   3. The mitral valve is degenerative. Trivial mitral valve regurgitation.  No evidence of mitral stenosis.   4. The aortic valve is tricuspid. There is moderate calcification of the  aortic valve. Aortic valve regurgitation is not visualized. Aortic valve  sclerosis is present, with no evidence of aortic valve stenosis.   5. The inferior vena cava is dilated in size with >50% respiratory  variability, suggesting right atrial pressure of 8 mmHg.   Cardiac catheterization 08/26/2022:   Ost LAD to Prox LAD lesion is 100% stenosed.   Prox LAD to Mid LAD lesion is 90% stenosed.   Ost RCA to Dist RCA lesion is  100% stenosed.   Origin to Prox Graft lesion is 100% stenosed.   LIMA graft was visualized by angiography and is large.   SVG graft was visualized by angiography.   SVG graft was visualized by angiography and is normal in caliber.   The graft exhibits no disease.   The graft exhibits no disease.   LV end diastolic pressure is normal.   Severe 2 vessel occlusive CAD. The left main and LCx are widely patent.  Patent LIMA to  the LAD. This does supply some flow into the first diagonal Occluded SVG to the diagonal Patent SVG to the right PDA Normal LV filling pressures. PCWP 12/14 mean 11 mm Hg. LVEDP 16 mm Hg Normal right  heart pressures. PAP 28/13 mean 21 mm Hg Cardiac output 5.08 L/min, index 2.37   Plan: medical management.  Assessment and Plan:  1.  Multivessel CAD status post CABG in 2002 following inferior STEMI with LIMA to LAD, SVG to diagonal, and SVG to PDA.  More recently status post NSTEMI in August 2024 complicated by cardiogenic shock and VT.  Cardiac catheterization showed occluded SVG to diagonal with otherwise patent LIMA to LAD and patent SVG to PDA.  Biventricular dysfunction out of proportion to degree of CAD.  He was followed by the advanced heart failure team with supportive measures, ultimately transitioned to medical therapy and palliative care approach.  He is in the [facility] at this time with hospice care.  As discussed above, has stabilized since hospital stay with NYHA class III symptoms and no progressive fluid retention.  Only medication now is Lasix.  Plan to continue with hospice care, we will reevaluate in March depending on how he does in terms of whether any further follow-up cardiac imaging would be obtained or potential adjustments in medications.   2.  HFrEF with ischemic cardiomyopathy, LVEF less than 20% and severe RV dysfunction by echocardiogram in August 2024.   3.  History of complete heart block status post St. Jude pacemaker with follow-up by Dr. Ladona Ridgel.  4.  CKD stage IIIb, creatinine 1.65 with GFR 41 in December 2024.  Disposition:  Follow up  March.  Signed, Jonelle Sidle, M.D., F.A.C.C. [facility] at [facility]

## 2023-02-01 NOTE — Patient Instructions (Signed)
Medication Instructions:   Your physician recommends that you continue on your current medications as directed. Please refer to the Current Medication list given to you today.   Labwork: None today  Testing/Procedures: None today  Follow-Up: March 16109  Any Other Special Instructions Will Be Listed Below (If Applicable).  If you need a refill on your cardiac medications before your next appointment, please call your pharmacy.Marland Kitchen

## 2023-02-08 ENCOUNTER — Ambulatory Visit: Payer: Self-pay

## 2023-02-09 ENCOUNTER — Encounter: Payer: Self-pay | Admitting: Adult Health

## 2023-02-09 ENCOUNTER — Non-Acute Institutional Stay (SKILLED_NURSING_FACILITY): Payer: Medicare HMO | Admitting: Adult Health

## 2023-02-09 DIAGNOSIS — D696 Thrombocytopenia, unspecified: Secondary | ICD-10-CM | POA: Diagnosis not present

## 2023-02-09 DIAGNOSIS — I5082 Biventricular heart failure: Secondary | ICD-10-CM

## 2023-02-09 DIAGNOSIS — I130 Hypertensive heart and chronic kidney disease with heart failure and stage 1 through stage 4 chronic kidney disease, or unspecified chronic kidney disease: Secondary | ICD-10-CM | POA: Diagnosis not present

## 2023-02-09 DIAGNOSIS — I482 Chronic atrial fibrillation, unspecified: Secondary | ICD-10-CM

## 2023-02-09 DIAGNOSIS — N1831 Chronic kidney disease, stage 3a: Secondary | ICD-10-CM

## 2023-02-09 NOTE — Progress Notes (Unsigned)
Location:  [facility] Room Number: 125 Place of Service:  SNF (31)   CODE STATUS: ***  Allergies  Allergen Reactions   Spironolactone Other (See Comments)    Hyperkalemia    Chief Complaint  Patient presents with   Medical Management of Chronic Issues    HPI:    Past Medical History:  Diagnosis Date   Allergic rhinitis    Arthritis    Asthma    Childhood   Atrial fibrillation (HCC)    Remote history - WARCEF   Bladder cancer (HCC)    BPH (benign prostatic hyperplasia)    Cardiomyopathy (HCC)    CHF (congestive heart failure) (HCC)    a. EF 30-35% by echo in 2018 and 10/2018, at 25-30% in 05/2020   Coronary atherosclerosis of native coronary artery    Multivessel s/p CABG in 2002 post-IMI, LVEF 35% up to 50% postoperatively;   Depression    Difficulty sleeping    Erectile dysfunction    Essential hypertension    Frequency of urination    History of bipolar disorder    Hyperlipidemia    IBS (irritable bowel syndrome)    Myocardial infarction (HCC) 2002    Past Surgical History:  Procedure Laterality Date   BIOPSY  03/28/2018   Procedure: BIOPSY;  Surgeon: Malissa Hippo, MD;  Location: [facility];  Service: Endoscopy;;  esophagus   BIOPSY  03/09/2021   Procedure: BIOPSY;  Surgeon: Dolores Frame, MD;  Location: [facility];  Service: Gastroenterology;;   BIV PACEMAKER INSERTION CRT-P N/A 05/12/2020   Procedure: BIV PACEMAKER INSERTION CRT-P;  Surgeon: Hillis Range, MD;  Location: [facility] INVASIVE CV LAB;  Service: Cardiovascular;  Laterality: N/A;   CATARACT EXTRACTION W/PHACO Left 12/29/2014   Procedure: CATARACT EXTRACTION PHACO AND INTRAOCULAR LENS PLACEMENT (IOC);  Surgeon: Susa Simmonds, MD;  Location: [facility] OR[facility];  Service: Ophthalmology;  Laterality: Left;  CDE:3.71   CATARACT EXTRACTION W/PHACO Right 03/30/2015   Procedure: CATARACT EXTRACTION PHACO AND INTRAOCULAR LENS PLACEMENT RIGHT EYE CDE=2.56;  Surgeon:  Susa Simmonds, MD;  Location: [facility];  Service: Ophthalmology;  Laterality: Right;   CIRCUMCISION  10/11/2003   COLONOSCOPY N/A 07/11/2012   rehman,transverse colon polyp (benign lymphoid); tortuous colon; prep adequate with much suction/lavage; hemorrhoids.   CORONARY ARTERY BYPASS GRAFT  08/10/2000   Dr. Tyrone Sage - LIMA to LAD, SVG to diagonal, SVG to PDA TRIPLE BYPASS   CYSTOSCOPY W/ RETROGRADES Bilateral 03/02/2015   Procedure: CYSTOSCOPY WITH RIGHT RETROGRADE PYELOGRAM,  ATTEMPTED LEFT RETROGRADE PYELOGRAM;  Surgeon: Malen Gauze, MD;  Location: [facility];  Service: Urology;  Laterality: Bilateral;   ESOPHAGEAL DILATION N/A 03/28/2018   Procedure: ESOPHAGEAL DILATION;  Surgeon: Malissa Hippo, MD;  Location: [facility];  Service: Endoscopy;  Laterality: N/A;   ESOPHAGEAL DILATION N/A 03/15/2022   Procedure: ESOPHAGEAL DILATION;  Surgeon: Dolores Frame, MD;  [facility]: [facility];  Service: Gastroenterology;  Laterality: N/A;   ESOPHAGOGASTRODUODENOSCOPY N/A 03/28/2018   rehman,Abnormal esophageal motility. mild schatzki ring at GEJ, dilated, patch of salmon colored mucosa at distal esophagus. Barrett's esophagus. 2cm HH. erosive gastropathy, normal pylorus, duodenal erosions w/o bleeding. normal second portion of duodenum   ESOPHAGOGASTRODUODENOSCOPY (EGD) WITH PROPOFOL N/A 03/09/2021   Procedure: ESOPHAGOGASTRODUODENOSCOPY (EGD) WITH PROPOFOL;  Surgeon: Dolores Frame, MD;  [facility];  Service: Gastroenterology;  Laterality: N/A;  945   ESOPHAGOGASTRODUODENOSCOPY (EGD) WITH PROPOFOL N/A 03/15/2022   Procedure: ESOPHAGOGASTRODUODENOSCOPY (EGD) WITH  PROPOFOL;  Surgeon: Marguerita Merles, Reuel Boom, MD;  Location: [facility];  Service: Gastroenterology;  Laterality: N/A;  1045am, asa 3   RIGHT/LEFT HEART CATH AND CORONARY/GRAFT ANGIOGRAPHY N/A 08/26/2022   Procedure: RIGHT/LEFT HEART CATH AND CORONARY/GRAFT ANGIOGRAPHY;  Surgeon: Swaziland, Peter M, MD;   Location: [facility] INVASIVE CV LAB;  Service: Cardiovascular;  Laterality: N/A;   TEMPORARY PACEMAKER N/A 05/12/2020   Procedure: TEMPORARY PACEMAKER;  Surgeon: Swazil[place], Peter M, MD;  Location: [facility] INVASIVE CV LAB;  Service: Cardiovascular;  Laterality: N/A;   TONSILLECTOMY     TRANSURETHRAL RESECTION OF BLADDER TUMOR N/A 01/26/2015   Procedure: TRANSURETHRAL RESECTION OF BLADDER TUMOR (TURBT);  Surgeon: Malen Gauze, MD;  Location: [facility];  Service: Urology;  Laterality: N/A;   TRANSURETHRAL RESECTION OF BLADDER TUMOR WITH GYRUS (TURBT-GYRUS) N/A 03/02/2015   Procedure: TRANSURETHRAL RESECTION OF BLADDER TUMOR WITH GYRUS (TURBT-GYRUS);  Surgeon: Malen Gauze, MD;  Location: [facility];  Service: Urology;  Laterality: N/A;   TRANSURETHRAL RESECTION OF PROSTATE N/A 01/26/2015   Procedure: TRANSURETHRAL RESECTION OF THE PROSTATE WITH GYRUS INSTRUMENTS;  Surgeon: Malen Gauze, MD;  Location: [facility];  Service: Urology;  Laterality: N/A;    Social History   Socioeconomic History   Marital status: Widowed    Spouse name: Not on file   Number of children: Not on file   Years of education: Not on file   Highest education level: Not on file  Occupational History   Not on file  Tobacco Use   Smoking status: Former    Current packs/day: 0.00    Average packs/day: 1 pack/day for 15.0 years (15.0 ttl pk-yrs)    Types: Cigarettes    Start date: 01/11/1956    Quit date: 01/11/1971    Years since quitting: 52.1    Passive exposure: Past   Smokeless tobacco: Never  Vaping Use   Vaping status: Never Used  Substance and Sexual Activity   Alcohol use: Not Currently    Comment: occ.   Drug use: No   Sexual activity: Not Currently  Other Topics Concern   Not on file  Social History Narrative   Married with no children    Exercises 4 times weekly   Caffeine once a day   Social Drivers of Health   Financial Resource Strain: Not on file  Food Insecurity: No Food Insecurity (08/24/2022)    Hunger Vital Sign    Worried About Running Out of Food in the Last Year: Never true    Ran Out of Food in the Last Year: Never true  Transportation Needs: No Transportation Needs (08/24/2022)   PRAPARE - Administrator, Civil Service (Medical): No    Lack of Transportation (Non-Medical): No  Physical Activity: Not on file  Stress: Not on file  Social Connections: Not on file  Intimate Partner Violence: Not At Risk (08/25/2022)   Humiliation, Afraid, Rape, and Kick questionnaire    Fear of Current or Ex-Partner: No    Emotionally Abused: No    Physically Abused: No    Sexually Abused: No   Family History  Problem Relation Age of Onset   Asthma Mother    Heart attack Father 12   Diabetes Brother       VITAL SIGNS BP 102/83   Pulse 73   Temp (!) 96.7 F (35.9 C)   Resp 20   Ht 6\' 2"  (1.88 m)   Wt 191 lb (86.6 kg)   SpO2 96%  BMI 24.52 kg/m   Outpatient Encounter Medications as of 02/09/2023  Medication Sig   divalproex (DEPAKOTE) 250 MG DR tablet Take 250 mg by mouth 2 (two) times daily.   famotidine (PEPCID) 40 MG tablet Take 40 mg by mouth daily.   fluticasone (FLONASE) 50 MCG/ACT nasal spray Place 2 sprays into both nostrils daily.   furosemide (LASIX) 40 MG tablet Take 40 mg by mouth daily.   loperamide (IMODIUM) 2 MG capsule Take 1 capsule (2 mg total) by mouth as needed for diarrhea or loose stools.   LORazepam (ATIVAN) 2 MG/ML concentrated solution Place 0.3 mLs (0.6 mg total) under the tongue every 6 (six) hours as needed for anxiety. The dose is 0.5 mg   melatonin 3 MG TABS tablet Take 1 tablet (3 mg total) by mouth at bedtime.   Morphine Sulfate (MORPHINE CONCENTRATE) 10 MG/0.5ML SOLN concentrated solution Place 0.25 mLs (5 mg total) under the tongue every 2 (two) hours as needed for moderate pain (or dyspnea).   ondansetron (ZOFRAN-ODT) 8 MG disintegrating tablet Take 8 mg by mouth every 4 (four) hours as needed for nausea or vomiting.   oseltamivir  (TAMIFLU) 30 MG capsule Take 30 mg by mouth daily.   polyethylene glycol (MIRALAX / GLYCOLAX) 17 g packet Take 17 g by mouth daily.   temazepam (RESTORIL) 15 MG capsule Take 1 capsule (15 mg total) by mouth at bedtime as needed for sleep.   traMADol (ULTRAM) 50 MG tablet Take 0.5 tablets (25 mg total) by mouth 2 (two) times daily.   No facility-administered encounter medications on file as of 02/09/2023.     SIGNIFICANT DIAGNOSTIC EXAMS       ASSESSMENT/ PLAN:     Synthia Innocent NP [facility]   call [phone]

## 2023-02-14 ENCOUNTER — Other Ambulatory Visit: Payer: Self-pay | Admitting: Adult Health

## 2023-02-14 MED ORDER — TEMAZEPAM 15 MG PO CAPS: 15.0000 mg | ORAL_CAPSULE | Freq: Every evening | ORAL | 0 refills | Status: DC | PRN

## 2023-02-14 MED ORDER — TRAMADOL HCL 50 MG PO TABS: 25.0000 mg | ORAL_TABLET | Freq: Two times a day (BID) | ORAL | 0 refills | Status: DC

## 2023-02-16 ENCOUNTER — Non-Acute Institutional Stay (SKILLED_NURSING_FACILITY): Payer: Medicare HMO | Admitting: Adult Health

## 2023-02-16 ENCOUNTER — Encounter: Payer: Self-pay | Admitting: Adult Health

## 2023-02-16 DIAGNOSIS — F319 Bipolar disorder, unspecified: Secondary | ICD-10-CM

## 2023-02-16 DIAGNOSIS — I5082 Biventricular heart failure: Secondary | ICD-10-CM

## 2023-02-16 DIAGNOSIS — I700 Atherosclerosis of aorta: Secondary | ICD-10-CM | POA: Diagnosis not present

## 2023-02-16 DIAGNOSIS — D696 Thrombocytopenia, unspecified: Secondary | ICD-10-CM | POA: Diagnosis not present

## 2023-02-16 NOTE — Progress Notes (Signed)
 Location:  [facility] Room Number: 127 Place of Service:  SNF (31)   CODE STATUS: dnr   Allergies  Allergen Reactions   Spironolactone  Other (See Comments)    Hyperkalemia    Chief Complaint  Patient presents with   Discharge Note    HPI:  He is a resident of this facility being discharged to assisted living. He is being followed by hospice care who will provide all needed dme. He will not need any home health. He will follow up with the medical provider at the facility.   Past Medical History:  Diagnosis Date   Allergic rhinitis    Arthritis    Asthma    Childhood   Atrial fibrillation (HCC)    Remote history - WARCEF   Bladder cancer (HCC)    BPH (benign prostatic hyperplasia)    Cardiomyopathy (HCC)    CHF (congestive heart failure) (HCC)    a. EF 30-35% by echo in 2018 and 10/2018, at 25-30% in 05/2020   Coronary atherosclerosis of native coronary artery    Multivessel s/p CABG in 2002 post-IMI, LVEF 35% up to 50% postoperatively;   Depression    Difficulty sleeping    Erectile dysfunction    Essential hypertension    Frequency of urination    History of bipolar disorder    Hyperlipidemia    IBS (irritable bowel syndrome)    Myocardial infarction (HCC) 2002    Past Surgical History:  Procedure Laterality Date   BIOPSY  03/28/2018   Procedure: BIOPSY;  Surgeon: Golda Claudis PENNER, MD;  [facility];  Service: Endoscopy;;  esophagus   BIOPSY  03/09/2021   Procedure: BIOPSY;  Surgeon: Eartha Angelia Sieving, MD;  [facility];  Service: Gastroenterology;;   BIV PACEMAKER INSERTION CRT-P N/A 05/12/2020   Procedure: BIV PACEMAKER INSERTION CRT-P;  Surgeon: Kelsie Agent, MD;  [facility]: [facility];  Service: Cardiovascular;  Laterality: N/A;   CATARACT EXTRACTION W/PHACO Left 12/29/2014   Procedure: CATARACT EXTRACTION PHACO AND INTRAOCULAR LENS PLACEMENT (IOC);  Surgeon: Dow JULIANNA Burke, MD;  Location: [facility];  Service: Ophthalmology;  Laterality: Left;  CDE:3.71   CATARACT EXTRACTION W/PHACO Right 03/30/2015   Procedure: CATARACT EXTRACTION PHACO AND INTRAOCULAR LENS PLACEMENT RIGHT EYE CDE=2.56;  Surgeon: Dow JULIANNA Burke, MD;  Location: [facility];  Service: Ophthalmology;  Laterality: Right;   CIRCUMCISION  10/11/2003   COLONOSCOPY N/A 07/11/2012   rehman,transverse colon polyp (benign lymphoid); tortuous colon; prep adequate with much suction/lavage; hemorrhoids.   CORONARY ARTERY BYPASS GRAFT  08/10/2000   Dr. Army - LIMA to LAD, SVG to diagonal, SVG to PDA TRIPLE BYPASS   CYSTOSCOPY W/ RETROGRADES Bilateral 03/02/2015   Procedure: CYSTOSCOPY WITH RIGHT RETROGRADE PYELOGRAM,  ATTEMPTED LEFT RETROGRADE PYELOGRAM;  Surgeon: Belvie LITTIE Clara, MD;  Location: [facility];  Service: Urology;  Laterality: Bilateral;   ESOPHAGEAL DILATION N/A 03/28/2018   Procedure: ESOPHAGEAL DILATION;  Surgeon: Golda Claudis PENNER, MD;  [facility];  Service: Endoscopy;  Laterality: N/A;   ESOPHAGEAL DILATION N/A 03/15/2022   Procedure: ESOPHAGEAL DILATION;  Surgeon: Eartha Angelia Sieving, MD;  [facility];  Service: Gastroenterology;  Laterality: N/A;   ESOPHAGOGASTRODUODENOSCOPY N/A 03/28/2018   rehman,Abnormal esophageal motility. mild schatzki ring at GEJ, dilated, patch of salmon colored mucosa at distal esophagus. Barrett's esophagus. 2cm HH. erosive gastropathy, normal pylorus, duodenal erosions w/o bleeding. normal second portion of duodenum   ESOPHAGOGASTRODUODENOSCOPY (EGD) WITH PROPOFOL  N/A  03/09/2021   Procedure: ESOPHAGOGASTRODUODENOSCOPY (EGD) WITH PROPOFOL ;  Surgeon: Eartha Angelia Sieving, MD;  [facility];  Service: Gastroenterology;  Laterality: N/A;  945   ESOPHAGOGASTRODUODENOSCOPY (EGD) WITH PROPOFOL  N/A 03/15/2022   Procedure: ESOPHAGOGASTRODUODENOSCOPY (EGD) WITH PROPOFOL ;  Surgeon: Eartha Angelia Sieving, MD;  [facility];  Service:  Gastroenterology;  Laterality: N/A;  1045am, asa 3   RIGHT/LEFT HEART CATH AND CORONARY/GRAFT ANGIOGRAPHY N/A 08/26/2022   Procedure: RIGHT/LEFT HEART CATH AND CORONARY/GRAFT ANGIOGRAPHY;  Surgeon: Jordan, Peter M, MD;  Location: [facility] INVASIVE CV LAB;  Service: Cardiovascular;  Laterality: N/A;   TEMPORARY PACEMAKER N/A 05/12/2020   Procedure: TEMPORARY PACEMAKER;  Surgeon: Jordan, Peter M, MD;  Location: [facility] INVASIVE CV LAB;  Service: Cardiovascular;  Laterality: N/A;   TONSILLECTOMY     TRANSURETHRAL RESECTION OF BLADDER TUMOR N/A 01/26/2015   Procedure: TRANSURETHRAL RESECTION OF BLADDER TUMOR (TURBT);  Surgeon: Belvie LITTIE Clara, MD;  [facility]: [facility];  Service: Urology;  Laterality: N/A;   TRANSURETHRAL RESECTION OF BLADDER TUMOR WITH GYRUS (TURBT-GYRUS) N/A 03/02/2015   Procedure: TRANSURETHRAL RESECTION OF BLADDER TUMOR WITH GYRUS (TURBT-GYRUS);  Surgeon: Belvie LITTIE Clara, MD;  Location: [facility];  Service: Urology;  Laterality: N/A;   TRANSURETHRAL RESECTION OF PROSTATE N/A 01/26/2015   Procedure: TRANSURETHRAL RESECTION OF THE PROSTATE WITH GYRUS INSTRUMENTS;  Surgeon: Belvie LITTIE Clara, MD;  [facility]: [facility];  Service: Urology;  Laterality: N/A;    Social History   Socioeconomic History   Marital status: Widowed    Spouse name: Not on file   Number of children: Not on file   Years of education: Not on file   Highest education level: Not on file  Occupational History   Not on file  Tobacco Use   Smoking status: Former    Current packs/day: 0.00    Average packs/day: 1 pack/day for 15.0 years (15.0 ttl pk-yrs)    Types: Cigarettes    Start date: 01/11/1956    Quit date: 01/11/1971    Years since quitting: 52.1    Passive exposure: Past   Smokeless tobacco: Never  Vaping Use   Vaping status: Never Used  Substance and Sexual Activity   Alcohol  use: Not Currently    Comment: occ.   Drug use: No   Sexual activity: Not Currently  Other Topics Concern   Not on file  Social  History Narrative   Married with no children    Exercises 4 times weekly   Caffeine once a day   Social Drivers of Health   Financial Resource Strain: Not on file  Food Insecurity: No Food Insecurity (08/24/2022)   Hunger Vital Sign    Worried About Running Out of Food in the Last Year: Never true    Ran Out of Food in the Last Year: Never true  Transportation Needs: No Transportation Needs (08/24/2022)   PRAPARE - Administrator, Civil Service (Medical): No    Lack of Transportation (Non-Medical): No  Physical Activity: Not on file  Stress: Not on file  Social Connections: Not on file  Intimate Partner Violence: Not At Risk (08/25/2022)   Humiliation, Afraid, Rape, and Kick questionnaire    Fear of Current or Ex-Partner: No    Emotionally Abused: No    Physically Abused: No    Sexually Abused: No   Family History  Problem Relation Age of Onset   Asthma Mother    Heart attack Father 58   Diabetes Brother  VITAL SIGNS BP 107/78   Pulse 69   Temp 98.1 F (36.7 C)   Resp 20   Ht 6' 2 (1.88 m)   Wt 185 lb (83.9 kg)   SpO2 97%   BMI 23.75 kg/m   Outpatient Encounter Medications as of 02/16/2023  Medication Sig   divalproex  (DEPAKOTE ) 250 MG DR tablet Take 250 mg by mouth 2 (two) times daily.   famotidine  (PEPCID ) 40 MG tablet Take 40 mg by mouth daily.   fluticasone (FLONASE) 50 MCG/ACT nasal spray Place 2 sprays into both nostrils daily.   furosemide  (LASIX ) 40 MG tablet Take 40 mg by mouth daily.   loperamide  (IMODIUM ) 2 MG capsule Take 1 capsule (2 mg total) by mouth as needed for diarrhea or loose stools.   LORazepam  (ATIVAN ) 2 MG/ML concentrated solution Place 0.3 mLs (0.6 mg total) under the tongue every 6 (six) hours as needed for anxiety. The dose is 0.5 mg   melatonin 3 MG TABS tablet Take 1 tablet (3 mg total) by mouth at bedtime.   Morphine  Sulfate (MORPHINE  CONCENTRATE) 10 MG/0.5ML SOLN concentrated solution Place 0.25 mLs (5 mg total) under  the tongue every 2 (two) hours as needed for moderate pain (or dyspnea).   ondansetron  (ZOFRAN -ODT) 8 MG disintegrating tablet Take 8 mg by mouth every 4 (four) hours as needed for nausea or vomiting.   oseltamivir (TAMIFLU) 30 MG capsule Take 30 mg by mouth daily.   polyethylene glycol (MIRALAX  / GLYCOLAX ) 17 g packet Take 17 g by mouth daily.   temazepam  (RESTORIL ) 15 MG capsule Take 1 capsule (15 mg total) by mouth at bedtime as needed for sleep.   traMADol  (ULTRAM ) 50 MG tablet Take 0.5 tablets (25 mg total) by mouth 2 (two) times daily.   No facility-administered encounter medications on file as of 02/16/2023.     SIGNIFICANT DIAGNOSTIC EXAMS  08-20-22: wbc 11.8; hgb 12.3; hct 35.5; mcv 96.2 plt 114; glucose 105; bun 56; creat 3.78; k+ 5.2; na++ 134; ca 8.6; gfr 15; protein 7.2; albumin 4.2 POC +; blood culture: no growth 08-21-22: BNP 2520.5 08-22-22: wbc 13.1; hgb 11.9; hct 35.5; mcv 93.7 plt 96; glucose 105; bun 67; creat 2.93; k+ 4.1; na++ 128; ca 8.4; gfr 21; hgb A1c 6.0; tsh 1.499; chol 132; ldl 73; trig 45; hdl 50; mag 2.0 08-29-22: wbc 9.3; hgb 10.7; hct 31.5; mcv 89.0 plt 91; glucose 351;bun 66; creat 2.30; k+ 3.6; na++ 117; ca 7.8; gfr 28; protein 5.6 albumin 2.9; vitamin B12: 741; phos 3.4 iron 64; tibc 256; ferritin 48 08-30-22: wbc 12.7; hgb 11.4; hct 33.7; mcv 89.4 plt 85 09-26-22: glucose 91; bun 26; creat 1.41; k+ 4.1; na++ 133; ca 8.6; gfr 50 10-31-22: glucose 73; bun 23; creat 1.31; k+ 3.8; na++ 131; ca 8.4; gfr 54; BNP 1686.0  NO NEW LABS   Physical Exam Constitutional:      General: He is not in acute distress.    Appearance: He is well-developed. He is not diaphoretic.  Neck:     Thyroid : No thyromegaly.  Cardiovascular:     Rate and Rhythm: Normal rate and regular rhythm.     Pulses: Normal pulses.     Heart sounds: Murmur heard.     Comments: pacemaker Pulmonary:     Effort: Pulmonary effort is normal. No respiratory distress.     Breath sounds: Normal breath  sounds.  Abdominal:     General: Bowel sounds are normal. There is no distension.  Palpations: Abdomen is soft.     Tenderness: There is no abdominal tenderness.  Musculoskeletal:        General: Normal range of motion.     Cervical back: Neck supple.     Right lower leg: Edema present.     Left lower leg: Edema present.  Lymphadenopathy:     Cervical: No cervical adenopathy.  Skin:    General: Skin is warm and dry.  Neurological:     Mental Status: He is alert. Mental status is at baseline.  Psychiatric:        Mood and Affect: Mood normal.      ASSESSMENT/ PLAN:   Patient is being discharged with the following home health services:  none   Patient is being discharged with the following durable medical equipment: hospice to provide hospital bed and wheelchair    Patient has been advised to f/u with their PCP in 1-2 weeks to for a transitions of care visit.  Social services at their facility was responsible for arranging this appointment.  Pt was provided with adequate prescriptions of noncontrolled medications to reach the scheduled appointment .  For controlled substances, a limited supply was provided as appropriate for the individual patient.  If the pt normally receives these medications from a pain clinic or has a contract with another physician, these medications should be received from that clinic or physician only).    Facility will provide medications.    Barnie Seip NP [facility]  call [phone]

## 2023-02-21 ENCOUNTER — Telehealth: Payer: Self-pay

## 2023-02-21 NOTE — Telephone Encounter (Signed)
ALERT TRANSMISSION RECEIVED:  VHR events AMS - majority false due to TWOS AF Burden PMT  Patient is currently under Hospice care.  All remotes cancelled. Alerts continue to come in for episodes, including numerous AMS episodes due to [facility].    02/01/23 note from Dr. Diona Browner:  "He is in the [facility] at this time with hospice care. As discussed above, has stabilized since hospital stay with NYHA class III symptoms and no progressive fluid retention. Only medication now is Lasix. Plan to continue with hospice care, we will reevaluate in March depending on how he does in terms of whether any further follow-up cardiac imaging would be obtained or potential adjustments in medications."  Should we consider turning off any of below alerts? Forwarding to Dr. Ladona Ridgel for review:

## 2023-02-27 ENCOUNTER — Encounter: Payer: Self-pay | Admitting: Internal Medicine

## 2023-02-27 ENCOUNTER — Ambulatory Visit: Payer: Medicare HMO | Attending: Internal Medicine | Admitting: Internal Medicine

## 2023-02-27 VITALS — BP 124/82 | HR 94 | Ht 72.0 in | Wt 194.6 lb

## 2023-02-27 DIAGNOSIS — I442 Atrioventricular block, complete: Secondary | ICD-10-CM | POA: Diagnosis not present

## 2023-02-27 LAB — CUP PACEART INCLINIC DEVICE CHECK
Battery Remaining Longevity: 66 mo
Battery Voltage: 2.99 V
Brady Statistic RA Percent Paced: 34 %
Brady Statistic RV Percent Paced: 93 %
Date Time Interrogation Session: 20250217134713
Implantable Lead Connection Status: 753985
Implantable Lead Connection Status: 753985
Implantable Lead Implant Date: 20220503
Implantable Lead Implant Date: 20220503
Implantable Lead Location: 753859
Implantable Lead Location: 753860
Implantable Pulse Generator Implant Date: 20220503
Lead Channel Impedance Value: 362.5 Ohm
Lead Channel Impedance Value: 475 Ohm
Lead Channel Pacing Threshold Amplitude: 0.75 V
Lead Channel Pacing Threshold Amplitude: 0.75 V
Lead Channel Pacing Threshold Amplitude: 1 V
Lead Channel Pacing Threshold Amplitude: 1 V
Lead Channel Pacing Threshold Pulse Width: 0.5 ms
Lead Channel Pacing Threshold Pulse Width: 0.5 ms
Lead Channel Pacing Threshold Pulse Width: 0.5 ms
Lead Channel Pacing Threshold Pulse Width: 0.5 ms
Lead Channel Sensing Intrinsic Amplitude: 1.6 mV
Lead Channel Sensing Intrinsic Amplitude: 10.1 mV
Lead Channel Setting Pacing Amplitude: 2 V
Lead Channel Setting Pacing Amplitude: 2.5 V
Lead Channel Setting Pacing Pulse Width: 0.5 ms
Lead Channel Setting Sensing Sensitivity: 2 mV
Pulse Gen Model: 2272
Pulse Gen Serial Number: 3920512

## 2023-02-27 NOTE — Progress Notes (Signed)
HPI Grant Stevens returns today for followup. He is a pleasant 83 yo man with a h/o high grade heart block who underwent DDD PM insertion. He could not implant an LV lead (Dr. Fawn Kirk) due to his antaomy. He has done well in the interim with no chest pain or sob. He had an NSTEMI in August and was placed in hospice. He is now 6 months out from that. He has class 3B heart failure and a left bundle paced QRS. He has chronic edema.  Allergies  Allergen Reactions   Spironolactone Other (See Comments)    Hyperkalemia     Current Outpatient Medications  Medication Sig Dispense Refill   divalproex (DEPAKOTE) 250 MG DR tablet Take 250 mg by mouth 2 (two) times daily.     famotidine (PEPCID) 40 MG tablet Take 40 mg by mouth daily.     fluticasone (FLONASE) 50 MCG/ACT nasal spray Place 2 sprays into both nostrils daily.     furosemide (LASIX) 40 MG tablet Take 40 mg by mouth daily.     loperamide (IMODIUM) 2 MG capsule Take 1 capsule (2 mg total) by mouth as needed for diarrhea or loose stools. 30 capsule 0   LORazepam (ATIVAN) 2 MG/ML concentrated solution Place 0.3 mLs (0.6 mg total) under the tongue every 6 (six) hours as needed for anxiety. The dose is 0.5 mg 30 mL 0   melatonin 3 MG TABS tablet Take 1 tablet (3 mg total) by mouth at bedtime.     Morphine Sulfate (MORPHINE CONCENTRATE) 10 MG/0.5ML SOLN concentrated solution Place 0.25 mLs (5 mg total) under the tongue every 2 (two) hours as needed for moderate pain (or dyspnea). 30 mL 0   ondansetron (ZOFRAN-ODT) 8 MG disintegrating tablet Take 8 mg by mouth every 4 (four) hours as needed for nausea or vomiting.     polyethylene glycol (MIRALAX / GLYCOLAX) 17 g packet Take 17 g by mouth daily.     temazepam (RESTORIL) 15 MG capsule Take 1 capsule (15 mg total) by mouth at bedtime as needed for sleep. 15 capsule 0   traMADol (ULTRAM) 50 MG tablet Take 0.5 tablets (25 mg total) by mouth 2 (two) times daily. 15 tablet 0   No current  facility-administered medications for this visit.     Past Medical History:  Diagnosis Date   Allergic rhinitis    Arthritis    Asthma    Childhood   Atrial fibrillation (HCC)    Remote history - WARCEF   Bladder cancer (HCC)    BPH (benign prostatic hyperplasia)    Cardiomyopathy (HCC)    CHF (congestive heart failure) (HCC)    a. EF 30-35% by echo in 2018 and 10/2018, at 25-30% in 05/2020   Coronary atherosclerosis of native coronary artery    Multivessel s/p CABG in 2002 post-IMI, LVEF 35% up to 50% postoperatively;   Depression    Difficulty sleeping    Erectile dysfunction    Essential hypertension    Frequency of urination    History of bipolar disorder    Hyperlipidemia    IBS (irritable bowel syndrome)    Myocardial infarction (HCC) 2002    ROS:   All systems reviewed and negative except as noted in the HPI.   Past Surgical History:  Procedure Laterality Date   BIOPSY  03/28/2018   Procedure: BIOPSY;  Surgeon: Malissa Hippo, MD;  [facility];  Service: Endoscopy;;  esophagus   BIOPSY  03/09/2021   Procedure: BIOPSY;  Surgeon: Marguerita Merles, Reuel Boom, MD;  [facility];  Service: Gastroenterology;;   BIV PACEMAKER INSERTION CRT-P N/A 05/12/2020   Procedure: BIV PACEMAKER INSERTION CRT-P;  Surgeon: Hillis Range, MD;  [facility];  Service: Cardiovascular;  Laterality: N/A;   CATARACT EXTRACTION W/PHACO Left 12/29/2014   Procedure: CATARACT EXTRACTION PHACO AND INTRAOCULAR LENS PLACEMENT (IOC);  Surgeon: Susa Simmonds, MD;  Location: [facility];  Service: Ophthalmology;  Laterality: Left;  CDE:3.71   CATARACT EXTRACTION W/PHACO Right 03/30/2015   Procedure: CATARACT EXTRACTION PHACO AND INTRAOCULAR LENS PLACEMENT RIGHT EYE CDE=2.56;  Surgeon: Susa Simmonds, MD;  Location: [facility];  Service: Ophthalmology;  Laterality: Right;   CIRCUMCISION  10/11/2003   COLONOSCOPY N/A 07/11/2012   rehman,transverse colon polyp  (benign lymphoid); tortuous colon; prep adequate with much suction/lavage; hemorrhoids.   CORONARY ARTERY BYPASS GRAFT  08/10/2000   Dr. Tyrone Sage - LIMA to LAD, SVG to diagonal, SVG to PDA TRIPLE BYPASS   CYSTOSCOPY W/ RETROGRADES Bilateral 03/02/2015   Procedure: CYSTOSCOPY WITH RIGHT RETROGRADE PYELOGRAM,  ATTEMPTED LEFT RETROGRADE PYELOGRAM;  Surgeon: Malen Gauze, MD;  Location: [facility];  Service: Urology;  Laterality: Bilateral;   ESOPHAGEAL DILATION N/A 03/28/2018   Procedure: ESOPHAGEAL DILATION;  Surgeon: Malissa Hippo, MD;  [facility];  Service: Endoscopy;  Laterality: N/A;   ESOPHAGEAL DILATION N/A 03/15/2022   Procedure: ESOPHAGEAL DILATION;  Surgeon: Dolores Frame, MD;  [facility];  Service: Gastroenterology;  Laterality: N/A;   ESOPHAGOGASTRODUODENOSCOPY N/A 03/28/2018   rehman,Abnormal esophageal motility. mild schatzki ring at GEJ, dilated, patch of salmon colored mucosa at distal esophagus. Barrett's esophagus. 2cm HH. erosive gastropathy, normal pylorus, duodenal erosions w/o bleeding. normal second portion of duodenum   ESOPHAGOGASTRODUODENOSCOPY (EGD) WITH PROPOFOL N/A 03/09/2021   Procedure: ESOPHAGOGASTRODUODENOSCOPY (EGD) WITH PROPOFOL;  Surgeon: Dolores Frame, MD;  [facility];  Service: Gastroenterology;  Laterality: N/A;  945   ESOPHAGOGASTRODUODENOSCOPY (EGD) WITH PROPOFOL N/A 03/15/2022   Procedure: ESOPHAGOGASTRODUODENOSCOPY (EGD) WITH PROPOFOL;  Surgeon: Dolores Frame, MD;  [facility];  Service: Gastroenterology;  Laterality: N/A;  1045am, asa 3   RIGHT/LEFT HEART CATH AND CORONARY/GRAFT ANGIOGRAPHY N/A 08/26/2022   Procedure: RIGHT/LEFT HEART CATH AND CORONARY/GRAFT ANGIOGRAPHY;  Surgeon: Swaziland, Peter M, MD;  Location: [facility] INVASIVE CV LAB;  Service: Cardiovascular;  Laterality: N/A;   TEMPORARY PACEMAKER N/A 05/12/2020   Procedure: TEMPORARY PACEMAKER;  Surgeon: Swaziland, Peter M,  MD;  Location: [facility] INVASIVE CV LAB;  Service: Cardiovascular;  Laterality: N/A;   TONSILLECTOMY     TRANSURETHRAL RESECTION OF BLADDER TUMOR N/A 01/26/2015   Procedure: TRANSURETHRAL RESECTION OF BLADDER TUMOR (TURBT);  Surgeon: Malen Gauze, MD;  [facility]: [facility];  Service: Urology;  Laterality: N/A;   TRANSURETHRAL RESECTION OF BLADDER TUMOR WITH GYRUS (TURBT-GYRUS) N/A 03/02/2015   Procedure: TRANSURETHRAL RESECTION OF BLADDER TUMOR WITH GYRUS (TURBT-GYRUS);  Surgeon: Malen Gauze, MD;  [facility]: [facility];  Service: Urology;  Laterality: N/A;   TRANSURETHRAL RESECTION OF PROSTATE N/A 01/26/2015   Procedure: TRANSURETHRAL RESECTION OF THE PROSTATE WITH GYRUS INSTRUMENTS;  Surgeon: Malen Gauze, MD;  [facility]: [facility];  Service: Urology;  Laterality: N/A;     Family History  Problem Relation Age of Onset   Asthma Mother    Heart attack Father 52   Diabetes Brother      Social History   Socioeconomic History   Marital status: Widowed  Spouse name: Not on file   Number of children: Not on file   Years of education: Not on file   Highest education level: Not on file  Occupational History   Not on file  Tobacco Use   Smoking status: Former    Current packs/day: 0.00    Average packs/day: 1 pack/day for 15.0 years (15.0 ttl pk-yrs)    Types: Cigarettes    Start date: 01/11/1956    Quit date: 01/11/1971    Years since quitting: 52.1    Passive exposure: Past   Smokeless tobacco: Never  Vaping Use   Vaping status: Never Used  Substance and Sexual Activity   Alcohol use: Not Currently    Comment: occ.   Drug use: No   Sexual activity: Not Currently  Other Topics Concern   Not on file  Social History Narrative   Married with no children    Exercises 4 times weekly   Caffeine once a day   Social Drivers of Health   Financial Resource Strain: Not on file  Food Insecurity: No Food Insecurity (08/24/2022)   Hunger Vital Sign    Worried About Running Out of  Food in the Last Year: Never true    Ran Out of Food in the Last Year: Never true  Transportation Needs: No Transportation Needs (08/24/2022)   PRAPARE - Administrator, Civil Service (Medical): No    Lack of Transportation (Non-Medical): No  Physical Activity: Not on file  Stress: Not on file  Social Connections: Not on file  Intimate Partner Violence: Not At Risk (08/25/2022)   Humiliation, Afraid, Rape, and Kick questionnaire    Fear of Current or Ex-Partner: No    Emotionally Abused: No    Physically Abused: No    Sexually Abused: No     BP 124/82   Pulse 94   Ht 6' (1.829 m)   Wt 194 lb 9.6 oz (88.3 kg)   SpO2 94%   BMI 26.39 kg/m   Physical Exam:  ill appearing eldelry man, NAD HEENT: Unremarkable Neck:  No JVD, no thyromegally Lymphatics:  No adenopathy Back:  No CVA tenderness Lungs:  Clear with basilar rales HEART:  Regular rate rhythm, no murmurs, no rubs, no clicks, split S2. Abd:  soft, positive bowel sounds, no organomegally, no rebound, no guarding Ext:  2 plus pulses, no edema, no cyanosis, no clubbing Skin:  No rashes no nodules Neuro:  CN II through XII intact, motor grossly intact  EKG - nsr with pacing induced LBBB  DEVICE  Normal device function.  See PaceArt for details.   Assess/Plan:  Heart block - he is conducting today with a PR of over 350 and he will be kept DDD pacing PPM - his st[employer] device is working normally. HTN - his bp is well controlled. No change in meds. Chronic systolic heart failure - his symptoms are class 3B. I have recommended biv PPM upgrade. He has been in hospice but is at the 6 month mark. I would suggest getting him out of hospice, and attempt to upgrade his device to help him feel better. I'll reach out to his hospice MD.   Dorathy Daft

## 2023-02-27 NOTE — Patient Instructions (Signed)
Medication Instructions:  Your physician recommends that you continue on your current medications as directed. Please refer to the Current Medication list given to you today.    Labwork: None today  Testing/Procedures: None today  Follow-Up: 4 months Dr.Taylor  Any Other Special Instructions Will Be Listed Below (If Applicable).     Dr.Taylor is going to reach out to hospice and we will call you regarding your pacemaker upgrade.  If you need a refill on your cardiac medications before your next appointment, please call your pharmacy.

## 2023-02-28 NOTE — Telephone Encounter (Signed)
I saw him in the [place] office yesterday. He appears to be "flunking out of hospice." He has improved over the past couple of months and I am in the process of seeing if we can remove from hospice as a left bundle lead might make him feel better. GT

## 2023-03-03 ENCOUNTER — Telehealth: Payer: Self-pay | Admitting: Internal Medicine

## 2023-03-03 NOTE — Telephone Encounter (Signed)
Tora Perches calling stating pt is having questions regarding having his pacemaker upgraded. He is wanting to drop hospice and have precedure done. Is he a candidate???? Please call niece [name] to discuss. Okey Regal can be reached out to as well if needed for additional questions. May want to discuss with her first to get clear understanding

## 2023-03-03 NOTE — Telephone Encounter (Signed)
Spoke with [name]. Stated she was not at the appointment on Monday and wanted to make sure Dr Ladona Ridgel knew everything that happened in August before Hospice came into play. Told [name] what was put in the visit notes but that I would verify with Dr Ladona Ridgel as I was not in the office with him that day to confirm.

## 2023-03-04 DIAGNOSIS — K219 Gastro-esophageal reflux disease without esophagitis: Secondary | ICD-10-CM | POA: Diagnosis not present

## 2023-03-04 DIAGNOSIS — I4891 Unspecified atrial fibrillation: Secondary | ICD-10-CM | POA: Diagnosis not present

## 2023-03-04 DIAGNOSIS — I214 Non-ST elevation (NSTEMI) myocardial infarction: Secondary | ICD-10-CM | POA: Diagnosis not present

## 2023-03-04 DIAGNOSIS — I5022 Chronic systolic (congestive) heart failure: Secondary | ICD-10-CM | POA: Diagnosis not present

## 2023-03-06 NOTE — Telephone Encounter (Signed)
 Dr Ladona Ridgel waiting on Hospice to return his call

## 2023-03-07 DIAGNOSIS — M545 Low back pain, unspecified: Secondary | ICD-10-CM | POA: Diagnosis not present

## 2023-03-07 DIAGNOSIS — N390 Urinary tract infection, site not specified: Secondary | ICD-10-CM | POA: Diagnosis not present

## 2023-03-07 NOTE — Telephone Encounter (Signed)
 Left message to call back.  -Calling to tell Grant Stevens that DrTaylor is waiting on a Hospice MD to call him back to review next steps.

## 2023-03-07 NOTE — Telephone Encounter (Signed)
 Pt's niece returning nurses call. Niece states that pt states that he does not want to do this. Pt's niece would like a c/b regarding this matter

## 2023-03-07 NOTE — Telephone Encounter (Signed)
 Spoke with [name]. [name] states that her uncle said he did not want to do the procedure however, the hospice nurse is concerned he has a UTI. [name] will get back to [place] about the results of that. [name] also stated concern that Pt is having a manic episode. States pt has history of bi polar disorder. Will forward to Dr Ladona Ridgel as Lorain Childes.

## 2023-03-09 ENCOUNTER — Emergency Department (HOSPITAL_COMMUNITY): Payer: Medicare HMO

## 2023-03-09 ENCOUNTER — Emergency Department (HOSPITAL_COMMUNITY)
Admission: EM | Admit: 2023-03-09 | Discharge: 2023-03-10 | Disposition: A | Discharge status: Home / Self Care | Payer: Medicare HMO | Attending: Emergency Medicine | Admitting: Emergency Medicine

## 2023-03-09 ENCOUNTER — Encounter (HOSPITAL_COMMUNITY): Payer: Self-pay

## 2023-03-09 ENCOUNTER — Other Ambulatory Visit: Payer: Self-pay

## 2023-03-09 DIAGNOSIS — R0902 Hypoxemia: Secondary | ICD-10-CM | POA: Diagnosis not present

## 2023-03-09 DIAGNOSIS — S129XXA Fracture of neck, unspecified, initial encounter: Secondary | ICD-10-CM | POA: Diagnosis not present

## 2023-03-09 DIAGNOSIS — S12101A Unspecified nondisplaced fracture of second cervical vertebra, initial encounter for closed fracture: Secondary | ICD-10-CM | POA: Diagnosis not present

## 2023-03-09 DIAGNOSIS — S0101XA Laceration without foreign body of scalp, initial encounter: Secondary | ICD-10-CM | POA: Diagnosis not present

## 2023-03-09 DIAGNOSIS — M4802 Spinal stenosis, cervical region: Secondary | ICD-10-CM | POA: Diagnosis not present

## 2023-03-09 DIAGNOSIS — J45909 Unspecified asthma, uncomplicated: Secondary | ICD-10-CM | POA: Diagnosis not present

## 2023-03-09 DIAGNOSIS — S12300A Unspecified displaced fracture of fourth cervical vertebra, initial encounter for closed fracture: Secondary | ICD-10-CM | POA: Diagnosis not present

## 2023-03-09 DIAGNOSIS — Z87891 Personal history of nicotine dependence: Secondary | ICD-10-CM | POA: Insufficient documentation

## 2023-03-09 DIAGNOSIS — I672 Cerebral atherosclerosis: Secondary | ICD-10-CM | POA: Diagnosis not present

## 2023-03-09 DIAGNOSIS — S0990XA Unspecified injury of head, initial encounter: Secondary | ICD-10-CM | POA: Diagnosis not present

## 2023-03-09 DIAGNOSIS — I110 Hypertensive heart disease with heart failure: Secondary | ICD-10-CM | POA: Insufficient documentation

## 2023-03-09 DIAGNOSIS — W19XXXA Unspecified fall, initial encounter: Secondary | ICD-10-CM | POA: Diagnosis not present

## 2023-03-09 DIAGNOSIS — I509 Heart failure, unspecified: Secondary | ICD-10-CM | POA: Diagnosis not present

## 2023-03-09 DIAGNOSIS — S12200A Unspecified displaced fracture of third cervical vertebra, initial encounter for closed fracture: Secondary | ICD-10-CM | POA: Diagnosis not present

## 2023-03-09 DIAGNOSIS — I6782 Cerebral ischemia: Secondary | ICD-10-CM | POA: Diagnosis not present

## 2023-03-09 DIAGNOSIS — W01198A Fall on same level from slipping, tripping and stumbling with subsequent striking against other object, initial encounter: Secondary | ICD-10-CM | POA: Insufficient documentation

## 2023-03-09 DIAGNOSIS — R58 Hemorrhage, not elsewhere classified: Secondary | ICD-10-CM | POA: Diagnosis not present

## 2023-03-09 MED ORDER — LIDOCAINE HCL (PF) 1 % IJ SOLN
10.0000 mL | Freq: Once | INTRAMUSCULAR | Status: AC
  Administered 2023-03-09: 10 mL
  Filled 2023-03-09: qty 10

## 2023-03-09 NOTE — Telephone Encounter (Signed)
 Noted, will continue with monitoring for alerts, no changes.

## 2023-03-09 NOTE — ED Provider Notes (Signed)
 [facility] Emergency Department Provider Note MRN:  [id number]  Arrival date & time: 03/10/23     Chief Complaint   Fall   History of Present Illness   Grant Stevens is a 83 y.o. year-old male with a history of A-fib, CHF presenting to the ED with chief complaint of fall.  Tripped and fell hit the back of his head this evening, no other complaints.  Denies chest pain, no abdominal pain, no back pain, no arm or leg injuries, no abdominal pain.  Review of Systems  A thorough review of systems was obtained and all systems are negative except as noted in the HPI and PMH.   Patient's Health History    Past Medical History:  Diagnosis Date   Allergic rhinitis    Arthritis    Asthma    Childhood   Atrial fibrillation (HCC)    Remote history - WARCEF   Bladder cancer (HCC)    BPH (benign prostatic hyperplasia)    Cardiomyopathy (HCC)    CHF (congestive heart failure) (HCC)    a. EF 30-35% by echo in 2018 and 10/2018, at 25-30% in 05/2020   Coronary atherosclerosis of native coronary artery    Multivessel s/p CABG in 2002 post-IMI, LVEF 35% up to 50% postoperatively;   Depression    Difficulty sleeping    Erectile dysfunction    Essential hypertension    Frequency of urination    History of bipolar disorder    Hyperlipidemia    IBS (irritable bowel syndrome)    Myocardial infarction (HCC) 2002    Past Surgical History:  Procedure Laterality Date   BIOPSY  03/28/2018   Procedure: BIOPSY;  Surgeon: Malissa Hippo, MD;  Location: [facility];  Service: Endoscopy;;  esophagus   BIOPSY  03/09/2021   Procedure: BIOPSY;  Surgeon: Dolores Frame, MD;  Location: [facility];  Service: Gastroenterology;;   BIV PACEMAKER INSERTION CRT-P N/A 05/12/2020   Procedure: BIV PACEMAKER INSERTION CRT-P;  Surgeon: Hillis Range, MD;  Location: [facility] INVASIVE CV LAB;  Service: Cardiovascular;  Laterality: N/A;   CATARACT EXTRACTION W/PHACO Left  12/29/2014   Procedure: CATARACT EXTRACTION PHACO AND INTRAOCULAR LENS PLACEMENT (IOC);  Surgeon: Susa Simmonds, MD;  Location: [facility] ORS;  Service: Ophthalmology;  Laterality: Left;  CDE:3.71   CATARACT EXTRACTION W/PHACO Right 03/30/2015   Procedure: CATARACT EXTRACTION PHACO AND INTRAOCULAR LENS PLACEMENT RIGHT EYE CDE=2.56;  Surgeon: Susa Simmonds, MD;  Location: [facility];  Service: Ophthalmology;  Laterality: Right;   CIRCUMCISION  10/11/2003   COLONOSCOPY N/A 07/11/2012   rehman,transverse colon polyp (benign lymphoid); tortuous colon; prep adequate with much suction/lavage; hemorrhoids.   CORONARY ARTERY BYPASS GRAFT  08/10/2000   Dr. Tyrone Sage - LIMA to LAD, SVG to diagonal, SVG to PDA TRIPLE BYPASS   CYSTOSCOPY W/ RETROGRADES Bilateral 03/02/2015   Procedure: CYSTOSCOPY WITH RIGHT RETROGRADE PYELOGRAM,  ATTEMPTED LEFT RETROGRADE PYELOGRAM;  Surgeon: Malen Gauze, MD;  Location: [facility] OR[facility];  Service: Urology;  Laterality: Bilateral;   ESOPHAGEAL DILATION N/A 03/28/2018   Procedure: ESOPHAGEAL DILATION;  Surgeon: Malissa Hippo, MD;  Location[facility];  Service: Endoscopy;  Laterality: N/A;   ESOPHAGEAL DILATION N/A 03/15/2022   Procedure: ESOPHAGEAL DILATION;  Surgeon: Dolores Frame, MD;  Location[facility];  Service: Gastroenterology;  Laterality: N/A;   ESOPHAGOGASTRODUODENOSCOPY N/A 03/28/2018   rehman,Abnormal esophageal motility. mild schatzki ring at GEJ, dilated, patch of salmon colored mucosa at distal esophagus. Barrett's  esophagus. 2cm HH. erosive gastropathy, normal pylorus, duodenal erosions w/o bleeding. normal second portion of duodenum   ESOPHAGOGASTRODUODENOSCOPY (EGD) WITH PROPOFOL N/A 03/09/2021   Procedure: ESOPHAGOGASTRODUODENOSCOPY (EGD) WITH PROPOFOL;  Surgeon: Dolores Frame, MD;  [facility];  Service: Gastroenterology;  Laterality: N/A;  945   ESOPHAGOGASTRODUODENOSCOPY (EGD) WITH PROPOFOL N/A 03/15/2022   Procedure:  ESOPHAGOGASTRODUODENOSCOPY (EGD) WITH PROPOFOL;  Surgeon: Dolores Frame, MD;  [facility];  Service: Gastroenterology;  Laterality: N/A;  1045am, asa 3   RIGHT/LEFT HEART CATH AND CORONARY/GRAFT ANGIOGRAPHY N/A 08/26/2022   Procedure: RIGHT/LEFT HEART CATH AND CORONARY/GRAFT ANGIOGRAPHY;  Surgeon: Swaziland, Peter M, MD;  Location: [facility] INVASIVE CV LAB;  Service: Cardiovascular;  Laterality: N/A;   TEMPORARY PACEMAKER N/A 05/12/2020   Procedure: TEMPORARY PACEMAKER;  Surgeon: [place], Peter M, MD;  Location: [facility] INVASIVE CV LAB;  Service: Cardiovascular;  Laterality: N/A;   TONSILLECTOMY     TRANSURETHRAL RESECTION OF BLADDER TUMOR N/A 01/26/2015   Procedure: TRANSURETHRAL RESECTION OF BLADDER TUMOR (TURBT);  Surgeon: Malen Gauze, MD;  Location[facility];  Service: Urology;  Laterality: N/A;   TRANSURETHRAL RESECTION OF BLADDER TUMOR WITH GYRUS (TURBT-GYRUS) N/A 03/02/2015   Procedure: TRANSURETHRAL RESECTION OF BLADDER TUMOR WITH GYRUS (TURBT-GYRUS);  Surgeon: Malen Gauze, MD;  Location: [facility];  Service: Urology;  Laterality: N/A;   TRANSURETHRAL RESECTION OF PROSTATE N/A 01/26/2015   Procedure: TRANSURETHRAL RESECTION OF THE PROSTATE WITH GYRUS INSTRUMENTS;  Surgeon: Malen Gauze, MD;  Location: [facility];  Service: Urology;  Laterality: N/A;    Family History  Problem Relation Age of Onset   Asthma Mother    Heart attack Father 24   Diabetes Brother     Social History   Socioeconomic History   Marital status: Widowed    Spouse name: Not on file   Number of children: Not on file   Years of education: Not on file   Highest education level: Not on file  Occupational History   Not on file  Tobacco Use   Smoking status: Former    Current packs/day: 0.00    Average packs/day: 1 pack/day for 15.0 years (15.0 ttl pk-yrs)    Types: Cigarettes    Start date: 01/11/1956    Quit date: 01/11/1971    Years since quitting: 52.1    Passive exposure: Past    Smokeless tobacco: Never  Vaping Use   Vaping status: Never Used  Substance and Sexual Activity   Alcohol use: Not Currently    Comment: occ.   Drug use: No   Sexual activity: Not Currently  Other Topics Concern   Not on file  Social History Narrative   Married with no children    Exercises 4 times weekly   Caffeine once a day   Social Drivers of Health   Financial Resource Strain: Not on file  Food Insecurity: No Food Insecurity (08/24/2022)   Hunger Vital Sign    Worried About Running Out of Food in the Last Year: Never true    Ran Out of Food in the Last Year: Never true  Transportation Needs: No Transportation Needs (08/24/2022)   PRAPARE - Administrator, Civil Service (Medical): No    Lack of Transportation (Non-Medical): No  Physical Activity: Not on file  Stress: Not on file  Social Connections: Not on file  Intimate Partner Violence: Not At Risk (08/25/2022)   Humiliation, Afraid, Rape, and Kick questionnaire    Fear of Current or  Ex-Partner: No    Emotionally Abused: No    Physically Abused: No    Sexually Abused: No     Physical Exam   Vitals:   03/09/23 2345 03/10/23 0030  BP: 100/81 99/81  Pulse: 94 91  Resp: 18 18  Temp:    SpO2: 95% 93%    CONSTITUTIONAL: Well-appearing, NAD NEURO/PSYCH:  Alert and oriented x 3, no focal deficits EYES:  eyes equal and reactive ENT/NECK:  no LAD, no JVD CARDIO: Regular rate, well-perfused, normal S1 and S2 PULM:  CTAB no wheezing or rhonchi GI/GU:  non-distended, non-tender MSK/SPINE:  No gross deformities, no edema SKIN:  no rash   *Additional and/or pertinent findings included in MDM below  Diagnostic and Interventional Summary    EKG Interpretation Date/Time:    Ventricular Rate:    PR Interval:    QRS Duration:    QT Interval:    QTC Calculation:   R Axis:      Text Interpretation:         Labs Reviewed - No data to display  CT HEAD WO CONTRAST ( )  Final Result    CT CERVICAL  SPINE WO CONTRAST  Final Result      Medications  lidocaine (PF) (XYLOCAINE) 1 % injection 10 mL (10 mLs Infiltration Given 03/09/23 2332)     Procedures  /  Critical Care .Laceration Repair  Date/Time: 03/10/2023 1:04 AM  Performed by: Sabas Sous, MD Authorized by: Sabas Sous, MD   Consent:    Consent obtained:  Verbal   Consent given by:  Healthcare agent and patient   Risks, benefits, and alternatives were discussed: yes     Risks discussed:  Infection, need for additional repair, nerve damage, poor wound healing, poor cosmetic result, pain, retained foreign body, tendon damage and vascular damage Universal protocol:    Procedure explained and questions answered to patient or proxy's satisfaction: yes     Immediately prior to procedure, a time out was called: yes     Patient identity confirmed:  Verbally with patient Anesthesia:    Anesthesia method:  Local infiltration   Local anesthetic:  Lidocaine 1% w/o epi Laceration details:    Location:  Scalp   Scalp location:  Occipital   Length (cm):  4   Depth (mm):  3 Pre-procedure details:    Preparation:  Patient was prepped and draped in usual sterile fashion Exploration:    Limited defect created (wound extended): no     Hemostasis achieved with:  Direct pressure   Wound exploration: wound explored through full range of motion and entire depth of wound visualized     Contaminated: no   Treatment:    Area cleansed with:  Soap and water   Amount of cleaning:  Standard Skin repair:    Repair method:  Staples   Number of staples:  6 Approximation:    Approximation:  Close Repair type:    Repair type:  Simple Post-procedure details:    Dressing:  Open (no dressing)   Procedure completion:  Tolerated well, no immediate complications   ED Course and Medical Decision Making  Initial Impression and Ddx Symptoms likely mechanical fall with head trauma, has a laceration to the back of his head.  Will need  laceration repair, differential diagnosis including critical bleeding, cervical spinal fracture.  Otherwise a nontraumatic exam.  Past medical/surgical history that increases complexity of ED encounter: A-fib, CHF  Interpretation of Diagnostics I personally reviewed the CT  imaging and my interpretation is as follows: No obvious intracranial bleeding  CT cervical spine showing 3 contiguous spinal process fractures of C2, C3, C4  Patient Reassessment and Ultimate Disposition/Management     Injury discussed with Dr. Conchita Paris of neurosurgery, not a serious injury, patient can be discharged with a soft collar and follow-up in the office.  Return precautions provided, appropriate for discharge.  Patient management required discussion with the following services or consulting groups:  Neurosurgery  Complexity of Problems Addressed Acute illness or injury that poses threat of life of bodily function  Additional Data Reviewed and Analyzed Further history obtained from: Further history from spouse/family member  Additional Factors Impacting ED Encounter Risk Consideration of hospitalization  Elmer Sow. Pilar Plate, MD [facility] Emergency Medicine [facility] [email or web address]  Final Clinical Impressions(s) / ED Diagnoses     ICD-10-CM   1. Laceration of scalp, initial encounter  S01.01XA     2. Closed fracture of spinous process of cervical vertebra, initial encounter (HCC)  [email or web address]       ED Discharge Orders     None        Discharge Instructions Discussed with and Provided to Patient:     Discharge Instructions      You were evaluated in the Emergency Department and after careful evaluation, we did not find any emergent condition requiring admission or further testing in the hospital.  Your exam/testing today is overall reassuring.  Your CT scans did not show any injuries to your brain.  You do have some small fractures or broken bones to your neck.  We  discussed this injury with the spine experts.  Overall the injury is mild and should heal well on its own.  Recommend use of a collar at home until you see the spine experts in the office.  Call the number later this morning to schedule an appointment.  Recommend Tylenol at home for discomfort.  Please return to the Emergency Department if you experience any worsening of your condition.   Thank you for allowing [place] to be a part of your care.       Sabas Sous, MD 03/10/23 [phone]

## 2023-03-09 NOTE — ED Triage Notes (Signed)
 [place] tonight and hit the back of his right head. Laceration noted to the back of head. Bleeding controlled. No blood thinners. Pt says he has been doing a lot today and was used tired. No other pain or complaints.

## 2023-03-10 DIAGNOSIS — S128XXA Fracture of other parts of neck, initial encounter: Secondary | ICD-10-CM | POA: Diagnosis not present

## 2023-03-10 NOTE — Discharge Instructions (Signed)
 You were evaluated in the Emergency Department and after careful evaluation, we did not find any emergent condition requiring admission or further testing in the hospital.  Your exam/testing today is overall reassuring.  Your CT scans did not show any injuries to your brain.  You do have some small fractures or broken bones to your neck.  We discussed this injury with the spine experts.  Overall the injury is mild and should heal well on its own.  Recommend use of a collar at home until you see the spine experts in the office.  Call the number later this morning to schedule an appointment.  Recommend Tylenol at home for discomfort.  Please return to the Emergency Department if you experience any worsening of your condition.   Thank you for allowing [place] to be a part of your care.

## 2023-03-21 NOTE — Telephone Encounter (Signed)
 There is an Scientist, forensic for the pt. He is deceased. I took him out of [place] and marked him deceased in [place].  [email or web address]

## 2023-03-21 NOTE — Telephone Encounter (Signed)
 When going in to set him back up on remote checks I notice that his file now has him noted as "deceased".   Do we have any information on this? I do not see any records.  Assess need to move forward with condolences and removal from monitoring/websites.

## 2023-04-11 DEATH — deceased

## 2023-04-26 ENCOUNTER — Ambulatory Visit: Payer: Medicare HMO | Admitting: Cardiology

## 2023-05-10 ENCOUNTER — Ambulatory Visit: Payer: Self-pay

## 2023-05-11 ENCOUNTER — Ambulatory Visit: Payer: Self-pay

## 2023-07-05 ENCOUNTER — Ambulatory Visit (INDEPENDENT_AMBULATORY_CARE_PROVIDER_SITE_OTHER): Payer: Medicare HMO

## 2023-07-06 ENCOUNTER — Ambulatory Visit: Payer: Medicare HMO | Admitting: Internal Medicine

## 2023-08-02 ENCOUNTER — Other Ambulatory Visit: Payer: Medicare HMO | Admitting: Urology

## 2023-08-09 ENCOUNTER — Ambulatory Visit: Payer: Self-pay

## 2023-08-10 ENCOUNTER — Ambulatory Visit: Payer: Self-pay

## 2023-11-08 ENCOUNTER — Ambulatory Visit: Payer: Self-pay

## 2023-11-08 ENCOUNTER — Ambulatory Visit: Payer: PPO

## 2023-11-09 ENCOUNTER — Ambulatory Visit: Payer: Self-pay

## 2024-02-07 ENCOUNTER — Ambulatory Visit: Payer: Self-pay

## 2024-02-08 ENCOUNTER — Ambulatory Visit: Payer: Self-pay

## 2024-05-09 ENCOUNTER — Ambulatory Visit: Payer: Self-pay

## 2024-08-08 ENCOUNTER — Ambulatory Visit: Payer: Self-pay

## 2024-11-07 ENCOUNTER — Ambulatory Visit: Payer: Self-pay

## 2025-02-06 ENCOUNTER — Ambulatory Visit: Payer: Self-pay

## 2025-08-06 ENCOUNTER — Ambulatory Visit: Payer: PPO
